# Patient Record
Sex: Male | Born: 1940
Health system: Southern US, Community
[De-identification: ages and names within clinical notes are randomized; demographics above are authoritative.]

## PROBLEM LIST (undated history)

## (undated) DIAGNOSIS — R569 Unspecified convulsions: Secondary | ICD-10-CM

## (undated) DIAGNOSIS — Z8719 Personal history of other diseases of the digestive system: Secondary | ICD-10-CM

## (undated) DIAGNOSIS — M545 Low back pain, unspecified: Secondary | ICD-10-CM

## (undated) DIAGNOSIS — K449 Diaphragmatic hernia without obstruction or gangrene: Secondary | ICD-10-CM

## (undated) DIAGNOSIS — I10 Essential (primary) hypertension: Secondary | ICD-10-CM

## (undated) DIAGNOSIS — F0150 Vascular dementia without behavioral disturbance: Secondary | ICD-10-CM

## (undated) DIAGNOSIS — G4733 Obstructive sleep apnea (adult) (pediatric): Secondary | ICD-10-CM

## (undated) DIAGNOSIS — E663 Overweight: Secondary | ICD-10-CM

## (undated) DIAGNOSIS — I872 Venous insufficiency (chronic) (peripheral): Secondary | ICD-10-CM

## (undated) DIAGNOSIS — D649 Anemia, unspecified: Secondary | ICD-10-CM

## (undated) DIAGNOSIS — E7800 Pure hypercholesterolemia, unspecified: Secondary | ICD-10-CM

## (undated) DIAGNOSIS — M109 Gout, unspecified: Secondary | ICD-10-CM

## (undated) DIAGNOSIS — K219 Gastro-esophageal reflux disease without esophagitis: Secondary | ICD-10-CM

## (undated) DIAGNOSIS — K5730 Diverticulosis of large intestine without perforation or abscess without bleeding: Secondary | ICD-10-CM

## (undated) DIAGNOSIS — E119 Type 2 diabetes mellitus without complications: Secondary | ICD-10-CM

## (undated) DIAGNOSIS — N200 Calculus of kidney: Secondary | ICD-10-CM

## (undated) DIAGNOSIS — M1990 Unspecified osteoarthritis, unspecified site: Secondary | ICD-10-CM

## (undated) DIAGNOSIS — K56609 Unspecified intestinal obstruction, unspecified as to partial versus complete obstruction: Secondary | ICD-10-CM

## (undated) DIAGNOSIS — K589 Irritable bowel syndrome without diarrhea: Secondary | ICD-10-CM

## (undated) DIAGNOSIS — I639 Cerebral infarction, unspecified: Secondary | ICD-10-CM

## (undated) DIAGNOSIS — Q638 Other specified congenital malformations of kidney: Secondary | ICD-10-CM

## (undated) DIAGNOSIS — F411 Generalized anxiety disorder: Secondary | ICD-10-CM

## (undated) DIAGNOSIS — C649 Malignant neoplasm of unspecified kidney, except renal pelvis: Secondary | ICD-10-CM

## (undated) DIAGNOSIS — Z87898 Personal history of other specified conditions: Secondary | ICD-10-CM

## (undated) DIAGNOSIS — I679 Cerebrovascular disease, unspecified: Secondary | ICD-10-CM

## (undated) DIAGNOSIS — F418 Other specified anxiety disorders: Secondary | ICD-10-CM

## (undated) DIAGNOSIS — Z5189 Encounter for other specified aftercare: Secondary | ICD-10-CM

## (undated) HISTORY — DX: Gastro-esophageal reflux disease without esophagitis: K21.9

## (undated) HISTORY — DX: Malignant neoplasm of unspecified kidney, except renal pelvis: C64.9

## (undated) HISTORY — PX: OTHER SURGICAL HISTORY: SHX169

## (undated) HISTORY — DX: Gout, unspecified: M10.9

## (undated) HISTORY — DX: Obstructive sleep apnea (adult) (pediatric): G47.33

## (undated) HISTORY — DX: Unspecified convulsions: R56.9

## (undated) HISTORY — DX: Low back pain: M54.5

## (undated) HISTORY — PX: CHOLECYSTECTOMY: SHX55

## (undated) HISTORY — DX: Diverticulosis of large intestine without perforation or abscess without bleeding: K57.30

## (undated) HISTORY — DX: Irritable bowel syndrome, unspecified: K58.9

## (undated) HISTORY — DX: Personal history of other diseases of the digestive system: Z87.19

## (undated) HISTORY — DX: Low back pain, unspecified: M54.50

## (undated) HISTORY — DX: Diaphragmatic hernia without obstruction or gangrene: K44.9

## (undated) HISTORY — DX: Personal history of other specified conditions: Z87.898

## (undated) HISTORY — DX: Other specified congenital malformations of kidney: Q63.8

## (undated) HISTORY — DX: Pure hypercholesterolemia, unspecified: E78.00

## (undated) HISTORY — DX: Encounter for other specified aftercare: Z51.89

## (undated) HISTORY — DX: Unspecified osteoarthritis, unspecified site: M19.90

## (undated) HISTORY — PX: SMALL INTESTINE SURGERY: SHX150

## (undated) HISTORY — DX: Cerebrovascular disease, unspecified: I67.9

## (undated) HISTORY — DX: Generalized anxiety disorder: F41.1

## (undated) HISTORY — DX: Unspecified intestinal obstruction, unspecified as to partial versus complete obstruction: K56.609

## (undated) HISTORY — PX: CAROTID ENDARTERECTOMY: SUR193

## (undated) HISTORY — DX: Type 2 diabetes mellitus without complications: E11.9

## (undated) HISTORY — DX: Essential (primary) hypertension: I10

## (undated) HISTORY — DX: Calculus of kidney: N20.0

## (undated) HISTORY — PX: NEPHRECTOMY: SHX65

## (undated) HISTORY — DX: Overweight: E66.3

## (undated) HISTORY — DX: Venous insufficiency (chronic) (peripheral): I87.2

---
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
## 1992-04-13 HISTORY — PX: NEPHRECTOMY: SHX65

## 1994-04-13 HISTORY — PX: INCISIONAL HERNIA REPAIR: SHX193

## 1995-03-14 HISTORY — PX: NASAL SINUS SURGERY: SHX719

## 1998-09-19 ENCOUNTER — Encounter: Payer: Self-pay | Admitting: Vascular Surgery

## 1998-09-20 ENCOUNTER — Inpatient Hospital Stay: Admission: RE | Admit: 1998-09-20 | Discharge: 1998-09-21 | Payer: Self-pay | Admitting: Vascular Surgery

## 1999-05-07 ENCOUNTER — Ambulatory Visit (HOSPITAL_COMMUNITY): Admission: RE | Admit: 1999-05-07 | Discharge: 1999-05-07 | Payer: Self-pay | Admitting: Neurosurgery

## 1999-05-07 ENCOUNTER — Encounter: Payer: Self-pay | Admitting: Neurosurgery

## 1999-05-30 ENCOUNTER — Ambulatory Visit (HOSPITAL_COMMUNITY): Admission: RE | Admit: 1999-05-30 | Discharge: 1999-05-31 | Payer: Self-pay | Admitting: Neurosurgery

## 1999-06-13 ENCOUNTER — Encounter: Payer: Self-pay | Admitting: Neurosurgery

## 1999-06-13 ENCOUNTER — Ambulatory Visit (HOSPITAL_COMMUNITY): Admission: RE | Admit: 1999-06-13 | Discharge: 1999-06-13 | Payer: Self-pay | Admitting: Neurosurgery

## 1999-07-09 ENCOUNTER — Inpatient Hospital Stay (HOSPITAL_COMMUNITY): Admission: EM | Admit: 1999-07-09 | Discharge: 1999-07-16 | Payer: Self-pay | Admitting: Emergency Medicine

## 1999-07-09 ENCOUNTER — Encounter: Payer: Self-pay | Admitting: Neurology

## 1999-07-09 ENCOUNTER — Ambulatory Visit (HOSPITAL_COMMUNITY): Admission: RE | Admit: 1999-07-09 | Discharge: 1999-07-09 | Payer: Self-pay | Admitting: Neurosurgery

## 1999-07-09 ENCOUNTER — Encounter: Payer: Self-pay | Admitting: Emergency Medicine

## 1999-07-09 ENCOUNTER — Encounter: Payer: Self-pay | Admitting: Neurosurgery

## 1999-07-10 ENCOUNTER — Encounter: Payer: Self-pay | Admitting: Neurology

## 1999-07-11 ENCOUNTER — Encounter: Payer: Self-pay | Admitting: Neurology

## 1999-10-30 ENCOUNTER — Encounter: Payer: Self-pay | Admitting: Neurology

## 1999-10-30 ENCOUNTER — Ambulatory Visit (HOSPITAL_COMMUNITY): Admission: RE | Admit: 1999-10-30 | Discharge: 1999-10-30 | Payer: Self-pay | Admitting: Neurology

## 1999-11-06 ENCOUNTER — Inpatient Hospital Stay (HOSPITAL_COMMUNITY): Admission: RE | Admit: 1999-11-06 | Discharge: 1999-11-07 | Payer: Self-pay | Admitting: Interventional Radiology

## 2000-02-17 ENCOUNTER — Ambulatory Visit (HOSPITAL_COMMUNITY): Admission: RE | Admit: 2000-02-17 | Discharge: 2000-02-17 | Payer: Self-pay | Admitting: *Deleted

## 2000-03-14 ENCOUNTER — Emergency Department (HOSPITAL_COMMUNITY): Admission: EM | Admit: 2000-03-14 | Discharge: 2000-03-14 | Payer: Self-pay | Admitting: Emergency Medicine

## 2000-06-21 ENCOUNTER — Ambulatory Visit (HOSPITAL_COMMUNITY): Admission: RE | Admit: 2000-06-21 | Discharge: 2000-06-21 | Payer: Self-pay | Admitting: Neurology

## 2000-06-21 ENCOUNTER — Encounter: Payer: Self-pay | Admitting: Neurology

## 2000-07-27 ENCOUNTER — Ambulatory Visit (HOSPITAL_COMMUNITY): Admission: RE | Admit: 2000-07-27 | Discharge: 2000-07-27 | Payer: Self-pay | Admitting: Interventional Radiology

## 2000-09-11 HISTORY — PX: CAROTID ENDARTERECTOMY: SUR193

## 2000-09-30 ENCOUNTER — Inpatient Hospital Stay (HOSPITAL_COMMUNITY): Admission: EM | Admit: 2000-09-30 | Discharge: 2000-10-10 | Payer: Self-pay | Admitting: Emergency Medicine

## 2000-09-30 ENCOUNTER — Encounter: Payer: Self-pay | Admitting: Emergency Medicine

## 2000-09-30 ENCOUNTER — Encounter: Payer: Self-pay | Admitting: Neurology

## 2000-10-01 ENCOUNTER — Encounter: Payer: Self-pay | Admitting: Neurology

## 2000-10-04 ENCOUNTER — Encounter: Payer: Self-pay | Admitting: Neurology

## 2001-10-25 ENCOUNTER — Ambulatory Visit (HOSPITAL_COMMUNITY): Admission: RE | Admit: 2001-10-25 | Discharge: 2001-10-25 | Payer: Self-pay | Admitting: Interventional Radiology

## 2002-01-05 ENCOUNTER — Other Ambulatory Visit: Admission: RE | Admit: 2002-01-05 | Discharge: 2002-01-05 | Payer: Self-pay | Admitting: Gastroenterology

## 2002-05-24 ENCOUNTER — Ambulatory Visit (HOSPITAL_COMMUNITY): Admission: RE | Admit: 2002-05-24 | Discharge: 2002-05-24 | Payer: Self-pay | Admitting: Neurology

## 2002-05-24 ENCOUNTER — Encounter: Payer: Self-pay | Admitting: Neurology

## 2002-11-06 ENCOUNTER — Ambulatory Visit (HOSPITAL_COMMUNITY): Admission: RE | Admit: 2002-11-06 | Discharge: 2002-11-06 | Payer: Self-pay | Admitting: Interventional Radiology

## 2002-11-13 ENCOUNTER — Ambulatory Visit (HOSPITAL_COMMUNITY): Admission: RE | Admit: 2002-11-13 | Discharge: 2002-11-13 | Payer: Self-pay | Admitting: Pulmonary Disease

## 2002-11-13 ENCOUNTER — Encounter: Payer: Self-pay | Admitting: Pulmonary Disease

## 2002-12-13 HISTORY — PX: CHOLECYSTECTOMY: SHX55

## 2002-12-22 ENCOUNTER — Encounter: Payer: Self-pay | Admitting: General Surgery

## 2002-12-26 ENCOUNTER — Inpatient Hospital Stay (HOSPITAL_COMMUNITY): Admission: RE | Admit: 2002-12-26 | Discharge: 2002-12-29 | Payer: Self-pay | Admitting: General Surgery

## 2002-12-26 ENCOUNTER — Encounter (INDEPENDENT_AMBULATORY_CARE_PROVIDER_SITE_OTHER): Payer: Self-pay | Admitting: *Deleted

## 2003-11-12 ENCOUNTER — Ambulatory Visit (HOSPITAL_COMMUNITY): Admission: RE | Admit: 2003-11-12 | Discharge: 2003-11-12 | Payer: Self-pay | Admitting: Interventional Radiology

## 2004-04-18 ENCOUNTER — Ambulatory Visit: Payer: Self-pay | Admitting: Gastroenterology

## 2004-10-28 ENCOUNTER — Ambulatory Visit: Payer: Self-pay | Admitting: Pulmonary Disease

## 2005-01-26 ENCOUNTER — Ambulatory Visit (HOSPITAL_COMMUNITY): Admission: RE | Admit: 2005-01-26 | Discharge: 2005-01-26 | Payer: Self-pay | Admitting: Neurosurgery

## 2005-02-06 ENCOUNTER — Encounter: Admission: RE | Admit: 2005-02-06 | Discharge: 2005-02-06 | Payer: Self-pay | Admitting: Neurosurgery

## 2005-03-23 ENCOUNTER — Ambulatory Visit (HOSPITAL_COMMUNITY): Admission: RE | Admit: 2005-03-23 | Discharge: 2005-03-23 | Payer: Self-pay | Admitting: Interventional Radiology

## 2005-04-30 ENCOUNTER — Ambulatory Visit: Payer: Self-pay | Admitting: Pulmonary Disease

## 2005-05-11 ENCOUNTER — Ambulatory Visit: Payer: Self-pay | Admitting: Pulmonary Disease

## 2005-05-15 ENCOUNTER — Ambulatory Visit (HOSPITAL_COMMUNITY): Admission: RE | Admit: 2005-05-15 | Discharge: 2005-05-15 | Payer: Self-pay | Admitting: Neurosurgery

## 2005-05-28 ENCOUNTER — Ambulatory Visit: Payer: Self-pay

## 2006-05-02 ENCOUNTER — Ambulatory Visit (HOSPITAL_COMMUNITY): Admission: RE | Admit: 2006-05-02 | Discharge: 2006-05-02 | Payer: Self-pay | Admitting: Rheumatology

## 2006-09-21 ENCOUNTER — Ambulatory Visit: Payer: Self-pay | Admitting: Pulmonary Disease

## 2006-09-22 ENCOUNTER — Ambulatory Visit: Payer: Self-pay | Admitting: Pulmonary Disease

## 2006-09-22 LAB — CONVERTED CEMR LAB
ALT: 20 units/L (ref 0–40)
AST: 19 units/L (ref 0–37)
Albumin: 3.2 g/dL — ABNORMAL LOW (ref 3.5–5.2)
Alkaline Phosphatase: 74 units/L (ref 39–117)
BUN: 23 mg/dL (ref 6–23)
Basophils Absolute: 0.1 10*3/uL (ref 0.0–0.1)
Basophils Relative: 0.8 % (ref 0.0–1.0)
Bilirubin, Direct: 0.1 mg/dL (ref 0.0–0.3)
CO2: 27 meq/L (ref 19–32)
Calcium: 9.4 mg/dL (ref 8.4–10.5)
Chloride: 106 meq/L (ref 96–112)
Cholesterol: 153 mg/dL (ref 0–200)
Creatinine, Ser: 1.2 mg/dL (ref 0.4–1.5)
Eosinophils Absolute: 0.7 10*3/uL — ABNORMAL HIGH (ref 0.0–0.6)
Eosinophils Relative: 11.2 % — ABNORMAL HIGH (ref 0.0–5.0)
GFR calc Af Amer: 78 mL/min
GFR calc non Af Amer: 64 mL/min
Glucose, Bld: 155 mg/dL — ABNORMAL HIGH (ref 70–99)
HCT: 40 % (ref 39.0–52.0)
HDL: 35.9 mg/dL — ABNORMAL LOW (ref >39.0)
Hemoglobin: 14.1 g/dL (ref 13.0–17.0)
Hgb A1c MFr Bld: 8.1 % — ABNORMAL HIGH (ref 4.6–6.0)
LDL Cholesterol: 93 mg/dL (ref 0–99)
Lymphocytes Relative: 21.1 % (ref 12.0–46.0)
MCHC: 35.3 g/dL (ref 30.0–36.0)
MCV: 92.9 fL (ref 78.0–100.0)
Monocytes Absolute: 0.5 10*3/uL (ref 0.2–0.7)
Monocytes Relative: 7.8 % (ref 3.0–11.0)
Neutro Abs: 3.7 10*3/uL (ref 1.4–7.7)
Neutrophils Relative %: 59.1 % (ref 43.0–77.0)
PSA: 0.92 ng/mL (ref 0.10–4.00)
Platelets: 196 10*3/uL (ref 150–400)
Potassium: 4.8 meq/L (ref 3.5–5.1)
RBC: 4.31 M/uL (ref 4.22–5.81)
RDW: 12.3 % (ref 11.5–14.6)
Sodium: 142 meq/L (ref 135–145)
TSH: 3.86 microintl units/mL (ref 0.35–5.50)
Total Bilirubin: 0.5 mg/dL (ref 0.3–1.2)
Total CHOL/HDL Ratio: 4.3
Total Protein: 6 g/dL (ref 6.0–8.3)
Triglycerides: 120 mg/dL (ref 0–149)
VLDL: 24 mg/dL (ref 0–40)
WBC: 6.4 10*3/uL (ref 4.5–10.5)

## 2007-02-01 ENCOUNTER — Ambulatory Visit: Payer: Self-pay | Admitting: Pulmonary Disease

## 2007-02-22 ENCOUNTER — Ambulatory Visit: Payer: Self-pay | Admitting: Pulmonary Disease

## 2007-02-22 LAB — CONVERTED CEMR LAB
BUN: 19 mg/dL (ref 6–23)
CO2: 29 meq/L (ref 19–32)
Calcium: 9.2 mg/dL (ref 8.4–10.5)
Chloride: 102 meq/L (ref 96–112)
Creatinine, Ser: 1.3 mg/dL (ref 0.4–1.5)
GFR calc Af Amer: 71 mL/min
GFR calc non Af Amer: 59 mL/min
Glucose, Bld: 352 mg/dL — ABNORMAL HIGH (ref 70–99)
Hgb A1c MFr Bld: 8.1 % — ABNORMAL HIGH (ref 4.6–6.0)
Potassium: 4.7 meq/L (ref 3.5–5.1)
Sodium: 138 meq/L (ref 135–145)

## 2007-03-01 ENCOUNTER — Ambulatory Visit (HOSPITAL_BASED_OUTPATIENT_CLINIC_OR_DEPARTMENT_OTHER): Admission: RE | Admit: 2007-03-01 | Discharge: 2007-03-01 | Payer: Self-pay | Admitting: Pulmonary Disease

## 2007-03-18 ENCOUNTER — Ambulatory Visit: Payer: Self-pay | Admitting: Pulmonary Disease

## 2007-03-22 DIAGNOSIS — M109 Gout, unspecified: Secondary | ICD-10-CM | POA: Insufficient documentation

## 2007-03-22 DIAGNOSIS — K449 Diaphragmatic hernia without obstruction or gangrene: Secondary | ICD-10-CM | POA: Insufficient documentation

## 2007-03-22 DIAGNOSIS — M545 Low back pain, unspecified: Secondary | ICD-10-CM | POA: Insufficient documentation

## 2007-03-22 DIAGNOSIS — F411 Generalized anxiety disorder: Secondary | ICD-10-CM

## 2007-03-22 DIAGNOSIS — I10 Essential (primary) hypertension: Secondary | ICD-10-CM | POA: Insufficient documentation

## 2007-03-22 DIAGNOSIS — K219 Gastro-esophageal reflux disease without esophagitis: Secondary | ICD-10-CM

## 2007-03-22 DIAGNOSIS — E7800 Pure hypercholesterolemia, unspecified: Secondary | ICD-10-CM

## 2007-03-22 DIAGNOSIS — M1990 Unspecified osteoarthritis, unspecified site: Secondary | ICD-10-CM | POA: Insufficient documentation

## 2007-03-25 ENCOUNTER — Encounter: Payer: Self-pay | Admitting: Pulmonary Disease

## 2007-04-29 ENCOUNTER — Encounter: Payer: Self-pay | Admitting: Pulmonary Disease

## 2007-10-03 ENCOUNTER — Encounter: Payer: Self-pay | Admitting: Pulmonary Disease

## 2007-10-17 ENCOUNTER — Telehealth (INDEPENDENT_AMBULATORY_CARE_PROVIDER_SITE_OTHER): Payer: Self-pay | Admitting: *Deleted

## 2007-11-02 ENCOUNTER — Telehealth: Payer: Self-pay | Admitting: Pulmonary Disease

## 2007-11-21 ENCOUNTER — Ambulatory Visit: Payer: Self-pay | Admitting: Pulmonary Disease

## 2007-11-23 ENCOUNTER — Ambulatory Visit: Payer: Self-pay | Admitting: Pulmonary Disease

## 2007-11-28 DIAGNOSIS — G4733 Obstructive sleep apnea (adult) (pediatric): Secondary | ICD-10-CM | POA: Insufficient documentation

## 2007-11-28 DIAGNOSIS — K5730 Diverticulosis of large intestine without perforation or abscess without bleeding: Secondary | ICD-10-CM | POA: Insufficient documentation

## 2007-11-28 DIAGNOSIS — I872 Venous insufficiency (chronic) (peripheral): Secondary | ICD-10-CM | POA: Insufficient documentation

## 2007-11-28 DIAGNOSIS — N200 Calculus of kidney: Secondary | ICD-10-CM

## 2007-11-28 DIAGNOSIS — Q631 Lobulated, fused and horseshoe kidney: Secondary | ICD-10-CM

## 2007-11-28 LAB — CONVERTED CEMR LAB
ALT: 19 units/L (ref 0–53)
AST: 18 units/L (ref 0–37)
Albumin: 3.3 g/dL — ABNORMAL LOW (ref 3.5–5.2)
Alkaline Phosphatase: 64 units/L (ref 39–117)
BUN: 28 mg/dL — ABNORMAL HIGH (ref 6–23)
Basophils Absolute: 0 10*3/uL (ref 0.0–0.1)
Basophils Relative: 0.7 % (ref 0.0–3.0)
Bilirubin, Direct: 0.1 mg/dL (ref 0.0–0.3)
CO2: 31 meq/L (ref 19–32)
Calcium: 9.1 mg/dL (ref 8.4–10.5)
Chloride: 108 meq/L (ref 96–112)
Cholesterol: 131 mg/dL (ref 0–200)
Creatinine, Ser: 1.2 mg/dL (ref 0.4–1.5)
Eosinophils Absolute: 0.4 10*3/uL (ref 0.0–0.7)
Eosinophils Relative: 8.2 % — ABNORMAL HIGH (ref 0.0–5.0)
GFR calc Af Amer: 77 mL/min
GFR calc non Af Amer: 64 mL/min
Glucose, Bld: 128 mg/dL — ABNORMAL HIGH (ref 70–99)
HCT: 39.4 % (ref 39.0–52.0)
HDL: 38.2 mg/dL — ABNORMAL LOW (ref >39.0)
Hemoglobin: 13.8 g/dL (ref 13.0–17.0)
Hgb A1c MFr Bld: 7.1 % — ABNORMAL HIGH (ref 4.6–6.0)
LDL Cholesterol: 76 mg/dL (ref 0–99)
Lymphocytes Relative: 18.8 % (ref 12.0–46.0)
MCHC: 35.1 g/dL (ref 30.0–36.0)
MCV: 96.2 fL (ref 78.0–100.0)
Monocytes Absolute: 0.4 10*3/uL (ref 0.1–1.0)
Monocytes Relative: 8.2 % (ref 3.0–12.0)
Neutro Abs: 3.6 10*3/uL (ref 1.4–7.7)
Neutrophils Relative %: 64.1 % (ref 43.0–77.0)
PSA: 0.76 ng/mL (ref 0.10–4.00)
Platelets: 170 10*3/uL (ref 150–400)
Potassium: 4.8 meq/L (ref 3.5–5.1)
RBC: 4.09 M/uL — ABNORMAL LOW (ref 4.22–5.81)
RDW: 11.9 % (ref 11.5–14.6)
Sodium: 143 meq/L (ref 135–145)
TSH: 3.26 microintl units/mL (ref 0.35–5.50)
Total Bilirubin: 0.6 mg/dL (ref 0.3–1.2)
Total CHOL/HDL Ratio: 3.4
Total Protein: 6.1 g/dL (ref 6.0–8.3)
Triglycerides: 84 mg/dL (ref 0–149)
Uric Acid, Serum: 3.8 mg/dL — ABNORMAL LOW (ref 4.0–7.8)
VLDL: 17 mg/dL (ref 0–40)
WBC: 5.4 10*3/uL (ref 4.5–10.5)

## 2008-03-14 DIAGNOSIS — K5900 Constipation, unspecified: Secondary | ICD-10-CM | POA: Insufficient documentation

## 2008-03-14 DIAGNOSIS — K222 Esophageal obstruction: Secondary | ICD-10-CM

## 2008-03-15 ENCOUNTER — Ambulatory Visit: Payer: Self-pay | Admitting: Gastroenterology

## 2008-03-15 DIAGNOSIS — R195 Other fecal abnormalities: Secondary | ICD-10-CM | POA: Insufficient documentation

## 2008-03-15 LAB — CONVERTED CEMR LAB
ALT: 31 units/L (ref 0–53)
AST: 28 units/L (ref 0–37)
Albumin: 3.4 g/dL — ABNORMAL LOW (ref 3.5–5.2)
Alkaline Phosphatase: 75 units/L (ref 39–117)
BUN: 29 mg/dL — ABNORMAL HIGH (ref 6–23)
Basophils Absolute: 0.2 10*3/uL — ABNORMAL HIGH (ref 0.0–0.1)
Basophils Relative: 3.5 % — ABNORMAL HIGH (ref 0.0–3.0)
Bilirubin, Direct: 0.1 mg/dL (ref 0.0–0.3)
CO2: 30 meq/L (ref 19–32)
Calcium: 9 mg/dL (ref 8.4–10.5)
Chloride: 108 meq/L (ref 96–112)
Creatinine, Ser: 1.5 mg/dL (ref 0.4–1.5)
Eosinophils Absolute: 0.5 10*3/uL (ref 0.0–0.7)
Eosinophils Relative: 7.9 % — ABNORMAL HIGH (ref 0.0–5.0)
Ferritin: 30.6 ng/mL (ref 22.0–322.0)
Folate: 10.1 ng/mL
GFR calc Af Amer: 60 mL/min
GFR calc non Af Amer: 49 mL/min
Glucose, Bld: 138 mg/dL — ABNORMAL HIGH (ref 70–99)
HCT: 39 % (ref 39.0–52.0)
Hemoglobin: 13.4 g/dL (ref 13.0–17.0)
Iron: 62 ug/dL (ref 42–165)
Lymphocytes Relative: 17.3 % (ref 12.0–46.0)
MCHC: 34.3 g/dL (ref 30.0–36.0)
MCV: 97.2 fL (ref 78.0–100.0)
Monocytes Absolute: 0.6 10*3/uL (ref 0.1–1.0)
Monocytes Relative: 9.8 % (ref 3.0–12.0)
Neutro Abs: 4.1 10*3/uL (ref 1.4–7.7)
Neutrophils Relative %: 61.5 % (ref 43.0–77.0)
Platelets: 179 10*3/uL (ref 150–400)
Potassium: 4.3 meq/L (ref 3.5–5.1)
RBC: 4.01 M/uL — ABNORMAL LOW (ref 4.22–5.81)
RDW: 12.2 % (ref 11.5–14.6)
Saturation Ratios: 15.7 % — ABNORMAL LOW (ref 20.0–50.0)
Sed Rate: 25 mm/hr — ABNORMAL HIGH (ref 0–16)
Sodium: 142 meq/L (ref 135–145)
TSH: 2.52 microintl units/mL (ref 0.35–5.50)
Total Bilirubin: 0.4 mg/dL (ref 0.3–1.2)
Total Protein: 6.6 g/dL (ref 6.0–8.3)
Transferrin: 281.8 mg/dL (ref >=212.0)
Vitamin B-12: 270 pg/mL (ref 211–911)
WBC: 6.5 10*3/uL (ref 4.5–10.5)

## 2008-03-26 ENCOUNTER — Ambulatory Visit: Payer: Self-pay | Admitting: Gastroenterology

## 2008-04-11 ENCOUNTER — Telehealth: Payer: Self-pay | Admitting: Pulmonary Disease

## 2008-04-11 ENCOUNTER — Encounter: Payer: Self-pay | Admitting: Pulmonary Disease

## 2008-04-19 ENCOUNTER — Encounter: Payer: Self-pay | Admitting: Pulmonary Disease

## 2008-04-24 ENCOUNTER — Encounter: Payer: Self-pay | Admitting: Pulmonary Disease

## 2008-05-03 ENCOUNTER — Ambulatory Visit: Payer: Self-pay | Admitting: Gastroenterology

## 2008-05-10 ENCOUNTER — Encounter: Payer: Self-pay | Admitting: Pulmonary Disease

## 2008-05-21 ENCOUNTER — Ambulatory Visit: Payer: Self-pay | Admitting: Pulmonary Disease

## 2008-05-27 LAB — CONVERTED CEMR LAB
BUN: 17 mg/dL (ref 6–23)
CO2: 28 meq/L (ref 19–32)
Calcium: 9.2 mg/dL (ref 8.4–10.5)
Chloride: 104 meq/L (ref 96–112)
Creatinine, Ser: 1.1 mg/dL (ref 0.4–1.5)
GFR calc Af Amer: 86 mL/min
GFR calc non Af Amer: 71 mL/min
Glucose, Bld: 249 mg/dL — ABNORMAL HIGH (ref 70–99)
Hgb A1c MFr Bld: 7.3 % — ABNORMAL HIGH (ref 4.6–6.0)
Potassium: 4.8 meq/L (ref 3.5–5.1)
Sodium: 139 meq/L (ref 135–145)

## 2008-06-05 ENCOUNTER — Telehealth (INDEPENDENT_AMBULATORY_CARE_PROVIDER_SITE_OTHER): Payer: Self-pay | Admitting: *Deleted

## 2008-06-06 DIAGNOSIS — E663 Overweight: Secondary | ICD-10-CM | POA: Insufficient documentation

## 2008-06-12 ENCOUNTER — Telehealth (INDEPENDENT_AMBULATORY_CARE_PROVIDER_SITE_OTHER): Payer: Self-pay | Admitting: *Deleted

## 2008-06-12 ENCOUNTER — Ambulatory Visit: Payer: Self-pay | Admitting: Vascular Surgery

## 2008-07-02 ENCOUNTER — Encounter: Payer: Self-pay | Admitting: Interventional Radiology

## 2008-07-17 ENCOUNTER — Ambulatory Visit: Payer: Self-pay | Admitting: Gastroenterology

## 2008-07-18 LAB — CONVERTED CEMR LAB
ALT: 24 units/L (ref 0–53)
AST: 27 units/L (ref 0–37)
Albumin: 3.5 g/dL (ref 3.5–5.2)
Alkaline Phosphatase: 79 units/L (ref 39–117)
Basophils Absolute: 0 10*3/uL (ref 0.0–0.1)
Basophils Relative: 0 % (ref 0.0–3.0)
Bilirubin, Direct: 0 mg/dL (ref 0.0–0.3)
Eosinophils Absolute: 0.4 10*3/uL (ref 0.0–0.7)
Eosinophils Relative: 7 % — ABNORMAL HIGH (ref 0.0–5.0)
Ferritin: 29.7 ng/mL (ref 22.0–322.0)
Folate: 12.5 ng/mL
HCT: 40.7 % (ref 39.0–52.0)
Hemoglobin: 14 g/dL (ref 13.0–17.0)
Iron: 43 ug/dL (ref 42–165)
Lymphocytes Relative: 16.9 % (ref 12.0–46.0)
Lymphs Abs: 1 10*3/uL (ref 0.7–4.0)
MCHC: 34.3 g/dL (ref 30.0–36.0)
MCV: 95.6 fL (ref 78.0–100.0)
Monocytes Absolute: 0.7 10*3/uL (ref 0.1–1.0)
Monocytes Relative: 10.6 % (ref 3.0–12.0)
Neutro Abs: 4.1 10*3/uL (ref 1.4–7.7)
Neutrophils Relative %: 65.5 % (ref 43.0–77.0)
Platelets: 180 10*3/uL (ref 150.0–400.0)
RBC: 4.26 M/uL (ref 4.22–5.81)
RDW: 12.2 % (ref 11.5–14.6)
Saturation Ratios: 11.6 % — ABNORMAL LOW (ref 20.0–50.0)
Total Bilirubin: 0.5 mg/dL (ref 0.3–1.2)
Total Protein: 6.6 g/dL (ref 6.0–8.3)
Transferrin: 265.3 mg/dL (ref 212.0–360.0)
Vitamin B-12: 222 pg/mL (ref 211–911)
WBC: 6.2 10*3/uL (ref 4.5–10.5)

## 2008-07-25 ENCOUNTER — Ambulatory Visit: Payer: Self-pay | Admitting: Gastroenterology

## 2008-07-25 DIAGNOSIS — E538 Deficiency of other specified B group vitamins: Secondary | ICD-10-CM | POA: Insufficient documentation

## 2008-07-25 LAB — CONVERTED CEMR LAB: Fecal Occult Bld: NEGATIVE

## 2008-07-30 ENCOUNTER — Telehealth (INDEPENDENT_AMBULATORY_CARE_PROVIDER_SITE_OTHER): Payer: Self-pay | Admitting: *Deleted

## 2008-08-01 ENCOUNTER — Ambulatory Visit: Payer: Self-pay | Admitting: Gastroenterology

## 2008-08-08 ENCOUNTER — Ambulatory Visit (HOSPITAL_COMMUNITY): Admission: RE | Admit: 2008-08-08 | Discharge: 2008-08-08 | Payer: Self-pay | Admitting: Interventional Radiology

## 2008-08-17 ENCOUNTER — Telehealth (INDEPENDENT_AMBULATORY_CARE_PROVIDER_SITE_OTHER): Payer: Self-pay | Admitting: *Deleted

## 2008-09-19 ENCOUNTER — Encounter: Payer: Self-pay | Admitting: Pulmonary Disease

## 2008-10-08 ENCOUNTER — Telehealth: Payer: Self-pay | Admitting: Pulmonary Disease

## 2008-12-24 ENCOUNTER — Telehealth (INDEPENDENT_AMBULATORY_CARE_PROVIDER_SITE_OTHER): Payer: Self-pay | Admitting: *Deleted

## 2009-03-14 ENCOUNTER — Ambulatory Visit (HOSPITAL_COMMUNITY): Admission: RE | Admit: 2009-03-14 | Discharge: 2009-03-14 | Payer: Self-pay | Admitting: Interventional Radiology

## 2009-04-02 ENCOUNTER — Ambulatory Visit: Payer: Self-pay | Admitting: Pulmonary Disease

## 2009-04-03 LAB — CONVERTED CEMR LAB
BUN: 19 mg/dL (ref 6–23)
Basophils Absolute: 0.1 10*3/uL (ref 0.0–0.1)
Basophils Relative: 0.9 % (ref 0.0–3.0)
CO2: 27 meq/L (ref 19–32)
Calcium: 9.3 mg/dL (ref 8.4–10.5)
Chloride: 108 meq/L (ref 96–112)
Creatinine, Ser: 1.2 mg/dL (ref 0.4–1.5)
Eosinophils Absolute: 0.4 10*3/uL (ref 0.0–0.7)
Eosinophils Relative: 6 % — ABNORMAL HIGH (ref 0.0–5.0)
GFR calc non Af Amer: 63.73 mL/min (ref >60)
Glucose, Bld: 87 mg/dL (ref 70–99)
HCT: 39.8 % (ref 39.0–52.0)
Hemoglobin: 13.4 g/dL (ref 13.0–17.0)
Hgb A1c MFr Bld: 6.9 % — ABNORMAL HIGH (ref 4.6–6.5)
Lymphocytes Relative: 19.4 % (ref 12.0–46.0)
Lymphs Abs: 1.3 10*3/uL (ref 0.7–4.0)
MCHC: 33.6 g/dL (ref 30.0–36.0)
MCV: 100.7 fL — ABNORMAL HIGH (ref 78.0–100.0)
Monocytes Absolute: 0.6 10*3/uL (ref 0.1–1.0)
Monocytes Relative: 9.6 % (ref 3.0–12.0)
Neutro Abs: 4.2 10*3/uL (ref 1.4–7.7)
Neutrophils Relative %: 64.1 % (ref 43.0–77.0)
Platelets: 214 10*3/uL (ref 150.0–400.0)
Potassium: 4.2 meq/L (ref 3.5–5.1)
RBC: 3.96 M/uL — ABNORMAL LOW (ref 4.22–5.81)
RDW: 11.8 % (ref 11.5–14.6)
Sodium: 143 meq/L (ref 135–145)
WBC: 6.6 10*3/uL (ref 4.5–10.5)

## 2010-01-14 ENCOUNTER — Telehealth (INDEPENDENT_AMBULATORY_CARE_PROVIDER_SITE_OTHER): Payer: Self-pay | Admitting: *Deleted

## 2010-01-15 ENCOUNTER — Ambulatory Visit: Payer: Self-pay | Admitting: Pulmonary Disease

## 2010-01-19 LAB — CONVERTED CEMR LAB
ALT: 22 units/L (ref 0–53)
AST: 21 units/L (ref 0–37)
Albumin: 3.6 g/dL (ref 3.5–5.2)
Alkaline Phosphatase: 85 units/L (ref 39–117)
BUN: 21 mg/dL (ref 6–23)
Basophils Absolute: 0.1 10*3/uL (ref 0.0–0.1)
Basophils Relative: 0.9 % (ref 0.0–3.0)
Bilirubin, Direct: 0.1 mg/dL (ref 0.0–0.3)
CO2: 25 meq/L (ref 19–32)
Calcium: 9.7 mg/dL (ref 8.4–10.5)
Chloride: 114 meq/L — ABNORMAL HIGH (ref 96–112)
Cholesterol: 210 mg/dL — ABNORMAL HIGH (ref 0–200)
Creatinine, Ser: 1.3 mg/dL (ref 0.4–1.5)
Direct LDL: 149.1 mg/dL
Eosinophils Absolute: 0.6 10*3/uL (ref 0.0–0.7)
Eosinophils Relative: 9 % — ABNORMAL HIGH (ref 0.0–5.0)
GFR calc non Af Amer: 58.49 mL/min (ref >60)
Glucose, Bld: 180 mg/dL — ABNORMAL HIGH (ref 70–99)
HCT: 38.3 % — ABNORMAL LOW (ref 39.0–52.0)
HDL: 40 mg/dL (ref >39.00)
Hemoglobin: 13.5 g/dL (ref 13.0–17.0)
Hgb A1c MFr Bld: 8.4 % — ABNORMAL HIGH (ref 4.6–6.5)
Lymphocytes Relative: 21.3 % (ref 12.0–46.0)
Lymphs Abs: 1.3 10*3/uL (ref 0.7–4.0)
MCHC: 35.1 g/dL (ref 30.0–36.0)
MCV: 96.3 fL (ref 78.0–100.0)
Monocytes Absolute: 0.6 10*3/uL (ref 0.1–1.0)
Monocytes Relative: 9.7 % (ref 3.0–12.0)
Neutro Abs: 3.6 10*3/uL (ref 1.4–7.7)
Neutrophils Relative %: 59.1 % (ref 43.0–77.0)
PSA: 0.95 ng/mL (ref 0.10–4.00)
Platelets: 183 10*3/uL (ref 150.0–400.0)
Potassium: 5.7 meq/L — ABNORMAL HIGH (ref 3.5–5.1)
RBC: 3.98 M/uL — ABNORMAL LOW (ref 4.22–5.81)
RDW: 12.1 % (ref 11.5–14.6)
Sodium: 145 meq/L (ref 135–145)
TSH: 2.59 microintl units/mL (ref 0.35–5.50)
Total Bilirubin: 0.6 mg/dL (ref 0.3–1.2)
Total CHOL/HDL Ratio: 5
Total Protein: 6.4 g/dL (ref 6.0–8.3)
Triglycerides: 161 mg/dL — ABNORMAL HIGH (ref 0.0–149.0)
Uric Acid, Serum: 4.7 mg/dL (ref 4.0–7.8)
VLDL: 32.2 mg/dL (ref 0.0–40.0)
WBC: 6.1 10*3/uL (ref 4.5–10.5)

## 2010-01-21 ENCOUNTER — Telehealth (INDEPENDENT_AMBULATORY_CARE_PROVIDER_SITE_OTHER): Payer: Self-pay | Admitting: *Deleted

## 2010-05-03 ENCOUNTER — Encounter: Payer: Self-pay | Admitting: Neurosurgery

## 2010-05-13 NOTE — Assessment & Plan Note (Signed)
Summary: F-UP [facility]/BAD IMPACTION/FH    History of Present Illness Visit Type: follow up Primary GI MD: Sheryn Bison MD FACP FAGA Primary Provider: Alroy Dust, MD Chief Complaint: post hospital for fecal impaction in [place]. Pt came off Nexium and is now having GERD sx.  History of Present Illness:   Peter Nicholson is a 70 year old white male without ulcer diabetes and peripheral vascular disease with basilar artery occlusion requiring neurosurgical radiographic stent placing and 2000 with continued neurologic followup Dr. Thad Ranger. He additionally has obstructive sleep apnea, hypertensive cardiovascular disease, and hyperlipidemia. I have been seeing him for some time 4 acid reflux management daily Nexium.  Patient usually has one to 2 loose bowel movements a day without melena or hematochezia. This past Sunday night he has sudden severe constipation and had to go to [facility] emergency department where he was disimpacted with multiple enemas and manual maneuvers. KUB of the abdomen is otherwise unremarkable. Since that time he's had some hard small pellet --like stool. Denies melena hematochezia and surprisingly has not had abdominal pain or nausea and vomiting. His last colonoscopy exam was in 2003 and was negative except for some diverticulosis.  The patient denies any recent changes in his medications or to narcotic use. Is chronically on warfarin and his diabetic medications, Crestor, allopurinol, he also carries a diagnosis of obstructive sleep apnea, venous insufficiency of his lower extremities, and has had previous exploratory surgery with lysis of adhesions after removal of what sounds like a possible hypernephroma .He also has had open exploratory laparotomy cholecystectomy by Dr Maryagnes Amos several years ago next is seeing him for some bile-salt enteropathy and diarrhea in 2006.   GI Review of Systems      Denies abdominal pain, acid reflux, belching, bloating, chest pain,  dysphagia with liquids, dysphagia with solids, heartburn, loss of appetite, nausea, vomiting, vomiting blood, weight loss, and  weight gain.      Reports constipation.     Denies anal fissure, black tarry stools, change in bowel habit, diarrhea, diverticulosis, fecal incontinence, heme positive stool, hemorrhoids, irritable bowel syndrome, jaundice, light color stool, liver problems, rectal bleeding, and  rectal pain.     Prior Medications Reviewed Using: List Brought by Patient  Updated Prior Medication List: SIMVASTATIN 80 MG  TABS (SIMVASTATIN) take 1 tab by mouth at bedtime... METFORMIN HCL 500 MG TABS (METFORMIN HCL) take one tablet by mouth two times a day GLYBURIDE 5 MG  TABS (GLYBURIDE) take one tablet by mouth two times a day ACTOS 30 MG  TABS (PIOGLITAZONE HCL) take 1 by mouth once daily NEXIUM 40 MG CPDR (ESOMEPRAZOLE MAGNESIUM) Take 1 tablet by mouth once a day ALLOPURINOL 300 MG TABS (ALLOPURINOL) Take 1 tablet by mouth once a day ALPRAZOLAM 0.5 MG TABS (ALPRAZOLAM) take 1/2 to 1 tab by mouth tis as needed for nerves... ONETOUCH ULTRA TEST  STRP (GLUCOSE BLOOD) Use 1 strip twice a day CRESTOR 10 MG TABS (ROSUVASTATIN CALCIUM) one tablet by mouth once daily WARFARIN SODIUM 2.5 MG TABS (WARFARIN SODIUM) as directed WARFARIN SODIUM 5 MG TABS (WARFARIN SODIUM) as directed  Current Allergies (reviewed today): DILAUDID PHENERGAN (PROMETHAZINE HCL)  Past Medical History:    Reviewed history from 03/14/2008 and no changes required:       Current Problems:        ESOPHAGEAL STRICTURE (ICD-530.3)       CONSTIPATION (ICD-564.00)       OBSTRUCTIVE SLEEP APNEA (ICD-327.23)       HYPERTENSION (ICD-401.9)  CEREBROVASCULAR DISEASE (ICD-437.9)       VENOUS INSUFFICIENCY (ICD-459.81)       HYPERCHOLESTEROLEMIA (ICD-272.0)       DIABETES MELLITUS (ICD-250.00)       HIATAL HERNIA (ICD-553.3)       GERD (ICD-530.81)       DIVERTICULOSIS OF COLON (ICD-562.10)       Hx of  HORSESHOE KIDNEY (ICD-753.3)       Hx of RENAL CELL CANCER (ICD-189.0)       Hx of NEPHROLITHIASIS (ICD-592.0)       DEGENERATIVE JOINT DISEASE (ICD-715.90)       GOUT (ICD-274.9)       BACK PAIN, LUMBAR (ICD-724.2)       ANXIETY (ICD-300.00)         Past Surgical History:    Reviewed history from 11/21/2007 and no changes required:       S/P incisional hernia repair 1/96 by Rexene Alberts       S/P sinus surg by Ramiro Harvest 12/96       S/P cholecystectomy by Rexene Alberts 9/04       S/P part nephrectomy (right side) for renal cell ca in his horseshoe kidney in 1994       Previous cerebral vascular angioplasties and stenting and also left carotid endarterectomy in June of 2002. He did have a left hemispheric TIA and stroke and basilar artery occlusion.   Family History:    Reviewed history from 11/21/2007 and no changes required:       mother deceased age 106       father deceased age 88 from heart attack       1 sibling alive age 54  no PMH       1 sibling deceased age 30 from breast cancer  Social History:    Reviewed history from 11/21/2007 and no changes required:       never smoked       exposed to second hand smoke       does not exercise       caffeine---2 cups per day       no etoh       married       2 children    Review of Systems  The patient denies allergy/sinus, anemia, anxiety-new, arthritis/joint pain, back pain, blood in urine, breast changes/lumps, change in vision, confusion, cough, coughing up blood, depression-new, fainting, fatigue, fever, headaches-new, hearing problems, heart murmur, heart rhythm changes, itching, menstrual pain, muscle pains/cramps, night sweats, nosebleeds, pregnancy symptoms, shortness of breath, skin rash, sleeping problems, sore throat, swelling of feet/legs, swollen lymph glands, thirst - excessive , urination - excessive , urination changes/pain, urine leakage, vision changes, and voice change.     Vital Signs:  Patient Profile:   70 Years Old  Male Height:     66 inches Weight:      220.38 pounds BMI:     35.70 Pulse rate:   88 / minute Pulse rhythm:   regular BP sitting:   136 / 84  (right arm) Cuff size:   regular  Vitals Entered By: Christie Nottingham CMA (March 15, 2008 4:13 PM)                  Physical Exam  General:     Well developed, well nourished, no acute distress.healthy appearing.   Head:     Normocephalic and atraumatic. Eyes:     PERRLA, no icterus.exam deferred to patient's ophthalmologist.   Lungs:  Clear throughout to auscultation. Heart:     Regular rate and rhythm; no murmurs, rubs,  or bruits. Abdomen:     Soft, nontender and nondistended. No masses, hepatosplenomegaly or hernias noted. Normal bowel sounds.he does have an obese abdomen with multiple well-healed surgical scars and apparently has had mesh placed his abdomen because of his multiple surgeries. Bowel sounds are normal. Rectal:     Normal exam.There were no masses or tenderness noted or impaction. Soft stools present which is +1 guaiac positive. Extremities:     No clubbing, cyanosis, edema or deformities noted. Neurologic:     Alert and  oriented x4;  grossly normal neurologically. Psych:     Alert and cooperative. Normal mood and affect.    Impression & Recommendations:  Problem # 1:  CONSTIPATION (ICD-564.00) Assessment: Deteriorated the patient had sudden constipation without abdominal pain and evidence of a rather severe impaction.he has a chronic history of bile-salt enteropathy and chronically loose stools. I'm somewhat concerned about the guaiac positive nature of his stool and feel that colonoscopy is indicated along with screening laboratory parameters. I placed him on Colace 100 mg twice a day along with twice a day MiraLax 8 ounces until his colonoscopy can become bleed. I do not think we can stop his Coumadin for this procedure because was very high risk for cerebrovascular complications. TLB-CBC Platelet -  w/Differential (85025-CBCD) TLB-BMP (Basic Metabolic Panel-BMET) (80048-METABOL) TLB-Hepatic/Liver Function Pnl (80076-HEPATIC) TLB-TSH (Thyroid Stimulating Hormone) (84443-TSH) TLB-B12, Serum-Total ONLY (21308-M57) TLB-Ferritin (82728-FER) TLB-Folic Acid (Folate) (82746-FOL) TLB-IBC Pnl (Iron/FE;Transferrin) (83550-IBC) TLB-Sedimentation Rate (ESR) (85651-ESR)  Orders: Colonoscopy (Colon) TLB-CBC Platelet - w/Differential (85025-CBCD) TLB-BMP (Basic Metabolic Panel-BMET) (80048-METABOL) TLB-Hepatic/Liver Function Pnl (80076-HEPATIC) TLB-TSH (Thyroid Stimulating Hormone) (84443-TSH) TLB-B12, Serum-Total ONLY (84696-E95) TLB-Ferritin (82728-FER) TLB-Folic Acid (Folate) (82746-FOL) TLB-IBC Pnl (Iron/FE;Transferrin) (83550-IBC) TLB-Sedimentation Rate (ESR) (85651-ESR)   Problem # 2:  FECAL OCCULT BLOOD (ICD-792.1) Assessment: New  Orders: Colonoscopy (Colon) TLB-CBC Platelet - w/Differential (85025-CBCD) TLB-BMP (Basic Metabolic Panel-BMET) (80048-METABOL) TLB-Hepatic/Liver Function Pnl (80076-HEPATIC) TLB-TSH (Thyroid Stimulating Hormone) (84443-TSH) TLB-B12, Serum-Total ONLY (28413-K44) TLB-Ferritin (82728-FER) TLB-Folic Acid (Folate) (82746-FOL) TLB-IBC Pnl (Iron/FE;Transferrin) (83550-IBC) TLB-Sedimentation Rate (ESR) (85651-ESR)   Problem # 3:  OBSTRUCTIVE SLEEP APNEA (ICD-327.23) Assessment: Improved  Problem # 4:  CEREBROVASCULAR DISEASE (ICD-437.9) Assessment: Unchanged continue multiple medications per Dr Kriste Basque and Dr. Thad Ranger in neurology.  Problem # 5:  DIABETES MELLITUS (ICD-250.00) Assessment: Improved adjustments in his medications for his colonoscopy procedure have been ordered. Otherwise he seems to be under fairly good control of his diabetes per primary care management.  Problem # 6:  Hx of RENAL CELL CANCER (ICD-189.0) Assessment: Unchanged the patient has had multiple abdominal surgeries including cholecystectomy and surgery for bowel obstruction  after his nephrectomy. He apparently Has Had Mesh implanted in his abdominal wall but his symptoms are at this time do not seem consistent with larger or small bowel obstruction.   Patient Instructions: 1)  Copy Sent To:Dr. Alroy Dust and Dr. Thad Ranger in neurology 2)  Please Continue current medications. 3)  Constipation and  Hemorrhoids brochure given. 4)  Colonoscopy and Flexible Sigmoidoscopy brochure given. 5)  Conscious Sedation brochure given. 6)  [facility] Patient Information Guide given to patient.    Prescriptions: NEXIUM 40 MG CPDR (ESOMEPRAZOLE MAGNESIUM) Take 1 tablet by mouth once a day  #30 x 11   Entered by:   Harlow Mares CMA   Authorized by:   Mardella Layman MD FACG,FAGA   Signed by:   Harlow Mares CMA on 03/15/2008  Method used:   Electronically to        [facility] (retail)       [address]       [place], [place]  [address]       Ph: [phone]       Fax: [phone]   RxID:   [id number] MOVIPREP 100 GM  SOLR (PEG-KCL-NACL-NASULF-NA ASC-C) As per prep instructions.  #1 x 0   Entered by:   Harlow Mares CMA   Authorized by:   Mardella Layman MD FACG,FAGA   Signed by:   Harlow Mares CMA on 03/15/2008   Method used:   Electronically to        [facility] (retail)       [address]       [place], [place]  [address]       Ph: [phone]       Fax: [phone]   RxID:   [id number]  ]

## 2010-05-13 NOTE — Assessment & Plan Note (Signed)
Summary: 6 month f/u appt/ pt is coming fasting/mg   Primary Care Makenah Karas:  Alroy Dust, MD  CC:  10 month ROV & review of mult medical problems....  History of Present Illness: 70 y/o WM here for a follow up visit... he has multiple medical problems as noted below...    ~  Dec09:  He was hosp in [place] w/ fecal impaction... subseq GI eval DrPatterson w/ EGD showing HH, severe erosive esophagitis from GERD- Rx Nexium Bid... & Colonoscopy showing mod severe divertics...   ~  May 21, 2008:  he is a severe vasculopath w/ vertebral art angioplasty by IR x2, and known basilar art occlusion... he remains on COUMADIN w/ protimes done by DrReynolds, Neuro...  also had left CAE in 2000 by [facility]... he denies any acute problems & feels that he has been doing well... weight = 216#, no change over the last 6months... since his brief hosp in [place] for the fecal impaction- he states "I have done some research & found a lady who does enema therapy"...   ~  April 02, 2009:  since he was here last he's had f/u [facility] 3/10 w/ CDopplers that were unchanged & no further VVS follow up deemed necessary per [facility]... he's also had 2 Q83mo MRI's of Head & Neck by DrDeveshwar w/ bilat carotid siphon region atherosclerosis & narrowing (unchanged), and markedly diseased distal left vertebral & prox basilar art narrowing & irregularity (no change)... they have recommended stopping the Coumadin & he is now on ASA & Plavix.   ~  January 15, 2010:  he's had a good 10 mo he says> active in yard, heats w/ wood & does his own wood splitting... unfortunately he hasn't lost any weight, & not realy on diet... he stopped his Crestor "after talking to many people" and he stopped his Actos as well> f/u fasting labs shows LDL up to 149, and A1c up to 8.4.Marland KitchenMarland Kitchen we discussed options and he will decide what he wants to do abut these serious medical problems... due for f/u eval by DrDeveshwar/ IR to check the status of his  posterior circ problems...   Current Problem List:  OBSTRUCTIVE SLEEP APNEA (ICD-327.23) - s/p split night sleep study 11/08- showing an AHI of 19, assoc w/ an O2 sat down to 78%... during the CPAP trial he titrated up to a pressure of 11cm but never ret to REM sleep so optimal pressure is still ???...he uses [facility] for his CPAP needs...  ~  12/10: he tells me that he has stopped the CPAP, that he sleeps better without it, not snoring/ rests well, & wakes feeling refreshed... he refuses Sleep Med re-eval etc...  HYPERTENSION (ICD-401.9) - not currently on medications... BP today= 140/80 and denies HA, fatigue, visual changes, CP, palipit, dizziness, syncope, dyspnea, edema, etc... he states that his home BP checks are "OK".  CEREBROVASCULAR DISEASE (ICD-437.9) - now on ASA 325mg /d & PLAVIX 75mg /d per DrDeveshwar/ DrLawson/ DrReynolds... he has severe extracranial cerebrovasc disease- esp in the post circulation, and he is s/p vertebral art angioplasty x 2, and w/ a known basilar art occlusion- prev on Coumadin controlled by DrReynolds (switched to ASA/ Plavix in 2010)... also followed by Windell Moulding and DrDeveshwar for IR... he had a left CAE in 6/00 by [facility]...  ~  CDoppler 2/07 showed 0-39% bilat ICA stenoses, s/p left CAE w/ DPA...  ~  eval 3/10 by Windell Moulding w/ CDopplers- "he released me"...  ~  continued f/u  DrDevershwar IR w/ MRI/ MRAs- last 12/10 showing bilat carotid siphon region atherosclerosis & narrowing (unchanged), and markedly diseased distal left vertebral & prox basilar art narrowing & irregularity (no change)...   VENOUS INSUFFICIENCY (ICD-459.81) - he knows to avoid sodium, elevate legs, wear support hose...  HYPERCHOLESTEROLEMIA (ICD-272.0) - prev on CRESTOR but he stopped on his own in 2011 "after talking w/ many people"...  ~  FLP 6/08 showed TChol 153, TG 120, HDL 36, LDL 93  ~  FLP 8/09 showed TChol 131, TG 84, HDL 38, LDL 76  ~  2010:  pt was not fasting  for blood work... rec to continue Crestor10 + diet efforts.  ~  FLP 10/11 showed TChol 210, TG 161, HDL 40, LDL 149... rec to start Crestor 20mg /d, he will decide.  DIABETES MELLITUS (ICD-250.00) - currently on METFORMIN 500mg - 2Bid + GLYBURIDE 5mg Bid + he stopped Actos on his own in 2011.  ~  labs 6/08 showed BS= 155, HgA1c= 8.1.Marland Kitchen. (glyburide incr to 5Bid).  ~  labs 11/08 showed BS= 352, HgA1c= 8.1.... (Actos added).  ~  labs 8/09 showed BS= 128, HgA1c= 7.1.Marland KitchenMarland Kitchen rec- same meds, better diet, get wt down!  ~  labs 2/10 showed BS= 249, A1c= 7.3.... Metform incr to 2Bid.  ~  labs 12/10 (nonfasting- on all 3 meds) showed BS= 87, A1c= 6.9  ~  labs 10/11 (on Metform1000Bid+Gybur5Bid) showed BS= 180, A1c= 8.4.Marland KitchenMarland Kitchen rec to incr GLYBUR10Bid + diet.  OVERWEIGHT (ICD-278.02) - he states that he follows a "fat free" diet & we discussed low carb requirement for his DM & exerc program...  ~  weight 8/09 = 216#  ~  weight 2/10 = 216#  ~  weight 12/10 = 212#  ~  weight 10/11 = 210#  GERD (ICD-530.81) - prev on Nexium40, he switched to OTC PRILOSEC 20mg /d...  ~  EGD 19/03 showed 4cmHH, GERD & esoph stricture dilated...  ~  EGD 12/09 by DrPatterson showed HH & severe erosive esophagitis from GERD...  DIVERTICULOSIS OF COLON (ICD-562.10) - note: he was hosp in [place] briefly 12/09 for fecal impaction...  ~  colonoscopy 10/03 by DrPatterson showed divertics only...  ~  colonoscopy 12/09 by DrPatterson was also normal x for divertics...  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 Hx of RENAL CELL CANCER (ICD-189.0) - he had a partial nephrectomy of the right side of his horseshoe kidney in 1994 due to renal cell ca... still followed by DrPeterson yearly by hs hx but we don't have any recent notes...  ~  labs 10/11 showed PSA= 0.95  Hx of NEPHROLITHIASIS (ICD-592.0) - remote hx of kidney stones followed by DrPeterson...  DEGENERATIVE JOINT DISEASE (ICD-715.90) - and  elbow tendonitis Rx'd OTC Prn...  GOUT (ICD-274.9) - on ALLOPURINOL 300mg /d...  ~  labs 10/11 showed URIC= 4.7  BACK PAIN, LUMBAR (ICD-724.2) - known degenerative spondylitic disease at L4-5 and L5-Si followed by [facility] for Neurosurg... back pain is doing reasonably well and last checked by [facility] in 2007.  ANXIETY (ICD-300.00) - uses ALPRAZOLAM 0.5mg  as needed.   Preventive Screening-Counseling & Management  Alcohol-Tobacco     Smoking Status: never  Allergies: 1)  Dilaudid 2)  Phenergan (Promethazine Hcl)  Comments:  Nurse/Medical Assistant: The patient's medications and allergies were reviewed with the patient and were updated in the Medication and Allergy Lists.  Past History:  Past Medical History: OBSTRUCTIVE SLEEP APNEA (ICD-327.23) HYPERTENSION (ICD-401.9) CEREBROVASCULAR DISEASE (ICD-437.9) VENOUS INSUFFICIENCY (ICD-459.81)  HYPERCHOLESTEROLEMIA (ICD-272.0) DIABETES MELLITUS (ICD-250.00) OVERWEIGHT (ICD-278.02) HIATAL HERNIA (ICD-553.3) GERD (ICD-530.81) DIVERTICULOSIS OF COLON (ICD-562.10) Hx of HORSESHOE KIDNEY (ICD-753.3) Hx of RENAL CELL CANCER (ICD-189.0) Hx of NEPHROLITHIASIS (ICD-592.0) DEGENERATIVE JOINT DISEASE (ICD-715.90) GOUT (ICD-274.9) BACK PAIN, LUMBAR (ICD-724.2) ANXIETY (ICD-300.00)  Past Surgical History: S/P incisional hernia repair 1/96 by Rexene Alberts S/P sinus surg by Dr Arletha Grippe 12/96 S/P cholecystectomy by Dr Maryagnes Amos 9/04 S/P part nephrectomy (right side) for renal cell ca in his horseshoe kidney in 1994 S/P left carotid endarterectomy in June of 2002 by [facility] S/P vertebral art angioplasties x2 by IR, w/ known basilar art occlusion intestinal surgery (per pt due to "twisted intestines")  Family History: Reviewed history from 07/17/2008 and no changes required. mother deceased age 88 father deceased age 47 from heart attack 1 sibling alive age 66  no PMH 1 sibling deceased age 60 from breast cancer No FH of Colon Cancer:  Social  History: Reviewed history from 07/17/2008 and no changes required. never smoked does not exercise caffeine---1 cup daily no etoh married 2 children  Review of Systems      See HPI       The patient complains of dyspnea on exertion.  The patient denies anorexia, fever, weight loss, weight gain, vision loss, decreased hearing, hoarseness, chest pain, syncope, peripheral edema, prolonged cough, headaches, hemoptysis, abdominal pain, melena, hematochezia, severe indigestion/heartburn, hematuria, incontinence, muscle weakness, suspicious skin lesions, transient blindness, difficulty walking, depression, unusual weight change, abnormal bleeding, enlarged lymph nodes, and angioedema.    Vital Signs:  Patient profile:   70 year old male Height:      66 inches Weight:      210 pounds BMI:     34.02 O2 Sat:      98 % on Room air Temp:     97.1 degrees F oral Pulse rate:   80 / minute BP sitting:   140 / 80  (left arm) Cuff size:   regular  Vitals Entered By: Randell Loop CMA (January 15, 2010 9:50 AM)  O2 Sat at Rest %:  98 O2 Flow:  Room air CC: 10 month ROV & review of mult medical problems... Is Patient Diabetic? Yes Pain Assessment Patient in pain? no      Comments MEDS UPDATED TODAY WITH PT   Physical Exam  Additional Exam:  WD, WN, 69 y/o WM in NAD...  GENERAL:  Alert & oriented; pleasant & cooperative... HEENT:  Forrest/AT, EOM-wnl, PERRLA, EACs-clear, TMs-wnl, NOSE-clear, THROAT-clear & wnl. NECK:  Supple w/ fairROM; no JVD; s/p left CAE, +bruits; no thyromegaly or nodules palpated; no lymphadenopathy. CHEST:  Clear to P & A; without wheezes/ rales/ or rhonchi heard... HEART:  Regular Rhythm; gr 1/6 SEM without rubs or gallops detected... ABDOMEN:  Soft & nontender; normal bowel sounds; no organomegaly or masses palpated... EXT: without deformities, mild arthritic changes; no varicose veins/ +venous insuffic/ 1+ edema. NEURO:  CN's intact; no focal neuro deficits... DERM:   No lesions noted; no rash etc...    MISC. Report  Procedure date:  01/15/2010  Findings:      BMP (METABOL)   Sodium                    145 mEq/L                   135-145   Potassium            [H]  5.7 mEq/L  3.5-5.1   Chloride             [H]  114 mEq/L                   96-112   Carbon Dioxide            25 mEq/L                    19-32   Glucose              [H]  180 mg/dL                   16-10   BUN                       21 mg/dL                    9-60   Creatinine                1.3 mg/dL                   4.5-4.0   Calcium                   9.7 mg/dL                   9.8-11.9   GFR                       58.49 mL/min                >60  Hepatic/Liver Function Panel (HEPATIC)   Total Bilirubin           0.6 mg/dL                   1.4-7.8   Direct Bilirubin          0.1 mg/dL                   2.9-5.6   Alkaline Phosphatase      85 U/L                      39-117   AST                       21 U/L                      0-37   ALT                       22 U/L                      0-53   Total Protein             6.4 g/dL                    2.1-3.0   Albumin                   3.6 g/dL                    8.6-5.7  CBC Platelet w/Diff (CBCD)   White Cell Count          6.1 K/uL  4.5-10.5   Red Cell Count       [L]  3.98 Mil/uL                 4.22-5.81   Hemoglobin                13.5 g/dL                   16.1-09.6   Hematocrit           [L]  38.3 %                      39.0-52.0   MCV                       96.3 fl                     78.0-100.0   Platelet Count            183.0 K/uL                  150.0-400.0   Neutrophil %              59.1 %                      43.0-77.0   Lymphocyte %              21.3 %                      12.0-46.0   Monocyte %                9.7 %                       3.0-12.0   Eosinophils%         [H]  9.0 %                       0.0-5.0   Basophils %               0.9 %                        0.0-3.0  Comments:      Lipid Panel (LIPID)   Cholesterol          [H]  210 mg/dL                   0-454   Triglycerides        [H]  161.0 mg/dL                 0.9-811.9   HDL                       14.78 mg/dL                 >29.56  Cholesterol LDL - Direct                             149.1 mg/dL  TSH (TSH)   FastTSH                   2.59 uIU/mL                 0.35-5.50  Uric Acid (URIC)   Uric Acid                 4.7 mg/dL                   1.6-1.0   Prostate Specific Antigen (PSA)   PSA-Hyb                   0.95 ng/mL                  0.10-4.00  Hemoglobin A1C (A1C)   Hemoglobin A1C       [H]  8.4 %                       4.6-6.5   Impression & Recommendations:  Problem # 1:  OBSTRUCTIVE SLEEP APNEA (ICD-327.23) Still not using CPAP (his choice) & denies sleep disordered breathing... does not want re-evaluation...  Problem # 2:  HYPERTENSION (ICD-401.9) Borderline control w/o meds... needs to elim sodium & lose weight... discussed strtegies w/ pt. Orders: TLB-BMP (Basic Metabolic Panel-BMET) (80048-METABOL) TLB-Hepatic/Liver Function Pnl (80076-HEPATIC) TLB-CBC Platelet - w/Differential (85025-CBCD) TLB-Lipid Panel (80061-LIPID) TLB-TSH (Thyroid Stimulating Hormone) (84443-TSH) TLB-Uric Acid, Blood (84550-URIC) TLB-PSA (Prostate Specific Antigen) (84153-PSA) TLB-A1C / Hgb A1C (Glycohemoglobin) (83036-A1C)  Problem # 3:  CEREBROVASCULAR DISEASE (ICD-437.9) He's a severe vasculopath & needs on-going monitoring by IR/ DrDeveshwar- appt scheduled for pt. Orders: Radiology Referral (Radiology)  Problem # 4:  HYPERCHOLESTEROLEMIA (ICD-272.0) He stopped prev Crestor on his own but FLP w/ LDL 149 & goal for his vasc dis is <70... I rec restart Crestor at 20mg /d, he states he will consider my recommendation and decide what he wants to do... The following medications were removed from the medication list:    Crestor 10 Mg Tabs (Rosuvastatin calcium) ..... One tablet by  mouth once daily  Problem # 5:  DIABETES MELLITUS (ICD-250.00) He stopped the Actos on his own & ?compliance w/ his other meds... A1c is up to 8.4 & pt is advised to take meds regularly & incr the GLYBURIDE to 10mg Bid before considering Actos restart due to $$ issues... he will consider my recommendations and let me know what he decides... The following medications were removed from the medication list:    Actos 30 Mg Tabs (Pioglitazone hcl) .Marland Kitchen... Take 1 tab by mouth once daily... His updated medication list for this problem includes:    Ecotrin 325 Mg Tbec (Aspirin) .Marland Kitchen... Take 1 tab by mouth once daily...    Metformin Hcl 500 Mg Tabs (Metformin hcl) .Marland Kitchen... Take two tablets by mouth two times a day    Glyburide 5 Mg Tabs (Glyburide) .Marland Kitchen... Take one tablet by mouth two times a day  Orders: TLB-BMP (Basic Metabolic Panel-BMET) (80048-METABOL) TLB-Hepatic/Liver Function Pnl (80076-HEPATIC) TLB-CBC Platelet - w/Differential (85025-CBCD) TLB-Lipid Panel (80061-LIPID) TLB-TSH (Thyroid Stimulating Hormone) (84443-TSH) TLB-Uric Acid, Blood (84550-URIC) TLB-PSA (Prostate Specific Antigen) (84153-PSA) TLB-A1C / Hgb A1C (Glycohemoglobin) (83036-A1C)  Problem # 6:  OVERWEIGHT (ICD-278.02) Weight reduction thru diet + exercise are the keys to success...  Problem # 7:  DIVERTICULOSIS OF COLON (ICD-562.10) GI per DrPatterson>  continue same meds.  Problem # 8:  Hx of RENAL CELL CANCER (ICD-189.0) GU per DrPeterson w/ yearly ROV...  Problem # 9:  DEGENERATIVE JOINT DISEASE (ICD-715.90) He has mod DJD, gout, etc... stable on current Rx... His updated medication list for this problem includes:    Ecotrin 325 Mg Tbec (Aspirin) .Marland Kitchen... Take 1 tab by mouth  once daily...  Problem # 10:  ANXIETY (ICD-300.00) Stable on Alprazolam Rx... His updated medication list for this problem includes:    Alprazolam 0.5 Mg Tabs (Alprazolam) .Marland Kitchen... Take 1/2 to 1 tab by mouth tis as needed for nerves...  Complete  Medication List: 1)  Ecotrin 325 Mg Tbec (Aspirin) .... Take 1 tab by mouth once daily.Marland KitchenMarland Kitchen 2)  Plavix 75 Mg Tabs (Clopidogrel bisulfate) .... Take 1 tab by mouth once daily.Marland KitchenMarland Kitchen 3)  Metformin Hcl 500 Mg Tabs (Metformin hcl) .... Take two tablets by mouth two times a day 4)  Glyburide 5 Mg Tabs (Glyburide) .... Take one tablet by mouth two times a day 5)  Prilosec Otc 20 Mg Tbec (Omeprazole magnesium) .... Take 1 tablet by mouth once a day 6)  Colace 100 Mg Caps (Docusate sodium) .... Take one by mouth once daily 7)  Miralax Powd (Polyethylene glycol 3350) .... Take one scoop in 8 ounces of water two times a day as needed 8)  Allopurinol 300 Mg Tabs (Allopurinol) .... Take 1 tablet by mouth once a day 9)  Alprazolam 0.5 Mg Tabs (Alprazolam) .... Take 1/2 to 1 tab by mouth tis as needed for nerves... 10)  Multivitamins Tabs (Multiple vitamin) .... Take 1 tablet by mouth once a day  Patient Instructions: 1)  Today we updated your med list- see below.... 2)  We refilled your current meds, and did not refill the meds you stopped on your own (Crestor & Actos)... 3)  Today we did your follow up FASTING blood work... please call the "phone tree" in a few days for your lab results... once we see how your Cholesterol & Diabetic control looks you will be able to decide if you want to restart these other meds... 4)  We will arrange for a follow up visit w/ [facility]war at [facility] to reassess the status of the blockages in your blood vessels.Marland KitchenMarland Kitchen 5)  Call for any problems.Marland KitchenMarland Kitchen 6)  Please schedule a follow-up appointment in 6 months, sooner as needed. Prescriptions: ALPRAZOLAM 0.5 MG TABS (ALPRAZOLAM) take 1/2 to 1 tab by mouth tis as needed for nerves...  #100 x 5   Entered and Authorized by:   Michele Mcalpine MD   Signed by:   Michele Mcalpine MD on 01/15/2010   Method used:   Print then Give to Patient   RxID:   [id number] ALLOPURINOL 300 MG TABS (ALLOPURINOL) Take 1 tablet by mouth once a day  #90 x  3   Entered and Authorized by:   Michele Mcalpine MD   Signed by:   Michele Mcalpine MD on 01/15/2010   Method used:   Print then Give to Patient   RxID:   [id number] GLYBURIDE 5 MG  TABS (GLYBURIDE) take one tablet by mouth two times a day  #180 x 3   Entered and Authorized by:   Michele Mcalpine MD   Signed by:   Michele Mcalpine MD on 01/15/2010   Method used:   Print then Give to Patient   RxID:   [id number] METFORMIN HCL 500 MG TABS (METFORMIN HCL) take two tablets by mouth two times a day  #360 x 3   Entered and Authorized by:   Michele Mcalpine MD   Signed by:   Michele Mcalpine MD on 01/15/2010   Method used:   Print then Give to Patient   RxID:   [id number] PLAVIX 75 MG TABS (CLOPIDOGREL BISULFATE) take 1 tab by  mouth once daily...  #90 x 3   Entered and Authorized by:   Michele Mcalpine MD   Signed by:   Michele Mcalpine MD on 01/15/2010   Method used:   Print then Give to Patient   RxID:   [id number]    Immunization History:  Influenza Immunization History:    Influenza:  historical (01/07/2010)

## 2010-05-13 NOTE — Progress Notes (Signed)
Summary: refills---needs ov--scheduled to see SN  Phone Note Call from Patient Call back at Home Phone [phone]   Caller: Patient Call For: nadel Reason for Call: Refill Medication Summary of Call: Needs refills on: alprazolam 0.5mg ,metformin hcl 500mg , glyburide 5mg .//[facility] [place] Initial call taken by: Darletta Moll,  January 14, 2010 2:42 PM  Follow-up for Phone Call        pt last saw SN 03/2009 and was told to f/u in 6 months with fasting bloodwork.  No appts scheduled.  Leigh ok'd refilled back in august x 1 only because pt needed ov.  called and spoke with pt.  pt aware of the above information.  there was an opening on SN's scheduled for tomorrow- pt agreed to come in and be seen at 9:30am.  Pt will come fasting.  Aundra Millet Reynolds LPN  January 14, 2010 3:18 PM

## 2010-05-13 NOTE — Progress Notes (Signed)
Summary: RX REQUEST  Phone Note Call from Patient Call back at Home Phone [phone]   Caller: Patient Call For: NADEL Summary of Call: PT REQUESTS RX FOR CRESTOR. [facility] [place].  Initial call taken by: Tivis Ringer, CNA,  January 21, 2010 4:13 PM  Follow-up for Phone Call        Per lab append from 10.5.11, pt needs to start on Crestor 20mg  and increase glyburide to 10mg  two times a day. Pt would like 30 day supply of crestor sent to  [facility] and is aware to take 2 tablets of glyburide bid.  He verbalized understanding.  Rx sent -- pt aware.  Also, called spoke with Tresa Endo at [place].  Informed her to change glyburide sig to 2 tablets two times a day.  She verbalized understanding. Follow-up by: Gweneth Dimitri RN,  January 21, 2010 4:35 PM    New/Updated Medications: GLYBURIDE 5 MG  TABS (GLYBURIDE) take two tablets by mouth two times a day CRESTOR 20 MG TABS (ROSUVASTATIN CALCIUM) Take 1 tab by mouth at bedtime Prescriptions: CRESTOR 20 MG TABS (ROSUVASTATIN CALCIUM) Take 1 tab by mouth at bedtime  #30 x 6   Entered by:   Gweneth Dimitri RN   Authorized by:   Michele Mcalpine MD   Signed by:   Gweneth Dimitri RN on 01/21/2010   Method used:   Electronically to        [facility] (retail)       [address]       [place], [place]  [address]       Ph: [phone]       Fax: [phone]   RxID:   [id number]

## 2010-07-15 LAB — CREATININE, SERUM
Creatinine, Ser: 1.44 mg/dL (ref 0.4–1.5)
GFR calc Af Amer: 59 mL/min — ABNORMAL LOW (ref >60)
GFR calc non Af Amer: 49 mL/min — ABNORMAL LOW (ref >60)

## 2010-07-23 LAB — DIFFERENTIAL
Eosinophils Absolute: 0.4 10*3/uL (ref 0.0–0.7)
Eosinophils Relative: 7 % — ABNORMAL HIGH (ref 0–5)
Lymphocytes Relative: 17 % (ref 12–46)
Lymphs Abs: 1.1 10*3/uL (ref 0.7–4.0)
Monocytes Relative: 8 % (ref 3–12)
Neutrophils Relative %: 67 % (ref 43–77)

## 2010-07-23 LAB — GLUCOSE, CAPILLARY
Glucose-Capillary: 105 mg/dL — ABNORMAL HIGH (ref 70–99)
Glucose-Capillary: 129 mg/dL — ABNORMAL HIGH (ref 70–99)

## 2010-07-23 LAB — CBC
HCT: 40.8 % (ref 39.0–52.0)
MCV: 97 fL (ref 78.0–100.0)
RBC: 4.2 MIL/uL — ABNORMAL LOW (ref 4.22–5.81)
WBC: 6.1 10*3/uL (ref 4.0–10.5)

## 2010-07-23 LAB — BASIC METABOLIC PANEL
Chloride: 108 mEq/L (ref 96–112)
GFR calc Af Amer: >60 mL/min (ref >60)
Potassium: 4.7 mEq/L (ref 3.5–5.1)

## 2010-07-23 LAB — PROTIME-INR: INR: 1.1 (ref 0.00–1.49)

## 2010-08-07 ENCOUNTER — Other Ambulatory Visit: Payer: Self-pay | Admitting: Pulmonary Disease

## 2010-08-11 ENCOUNTER — Other Ambulatory Visit: Payer: Self-pay | Admitting: Pulmonary Disease

## 2010-08-26 NOTE — Procedures (Signed)
NAME:  Peter Nicholson, Peter Nicholson NO.:  [id number]   MEDICAL RECORD NO.:  [id number]          PATIENT TYPE:  OUT   LOCATION:  SLEEP CENTER                 [facility]AC[facility]  [facility]   PHYSICIAN:  Barbaraann Share, MD,FCCPDATE OF BIRTH:  1940-03-11   DATE OF STUDY:  03/01/2007                            NOCTURNAL POLYSOMNOGRAM   REFERRING PHYSICIAN:  [name], MD   REFERRING PHYSICIAN:  Dr. Alroy Dust.   INDICATION FOR STUDY:  Hypersomnia with sleep apnea.   EPWORTH SLEEPINESS SCORE:  12   SLEEP ARCHITECTURE:  The patient had a total sleep time of 311 minutes  with very little slow wave sleep and only 71 minutes of REM.  Sleep  onset latency was rapid at 3.5 minutes and REM onset was prolonged at  140 minutes.  Sleep efficiency was decreased at 78%.   RESPIRATORY DATA:  The patient underwent split night protocol where he  was found to have 39 obstructive events in the first 125 minutes of  sleep; this gave him an apnea-hypopnea index of 19 events per hour  during the diagnostic portion of the study.  By protocol, the patient  was then placed on a CPAP mask which was not documented by the sleep  technician and CPAP pressure was increased incrementally.  A pressure of  11 cm was reached during the end of the night with no breakthrough  events on this pressure; however, the patient was on this level only 38  minutes and never achieved REM at this level.  The patient then asked  for the study to be discontinued.   OXYGEN DATA:  There was O2 desaturation as low at 78% with the patient's  obstructive events.   CARDIAC DATA:  No clinically significant arrhythmias were noted.   MOVEMENT/PARASOMNIA:  The patient was found to have 65 leg jerks with an  index of 13 per hour; however, none of these resulted in an arousal.   IMPRESSIONS/RECOMMENDATIONS:  1. Split night study reveals mild to moderate obstructive sleep apnea      with an apnea-hypopnea index of 19 events per hour  during the      diagnostic portion of the study.  The patient was then placed on      continuous positive airway pressure and titrated to a final      pressure of 11 cm.  However, the patient spent very little time on      this pressure,      and never achieved REM.  It is unclear whether this is an optimal      pressure for the patient or not, but does represent a good starting      point.      Barbaraann Share, MD,FCCP  Diplomate, American Board of Sleep  Medicine  Electronically Signed     [facility]  D:  03/19/2007 11:38:05  T:  03/20/2007 09:13:25  Job:  [id number]

## 2010-08-26 NOTE — Procedures (Signed)
CAROTID DUPLEX EXAM   INDICATION:  Follow-up evaluation of right carotid bruit.   HISTORY:  Diabetes:  Orally controlled.  Cardiac:  No.  Hypertension:  Yes.  Smoking:  No.  Previous Surgery:  Left carotid endarterectomy with Dacron patch  angioplasty on 09/20/98.  CV History:  Previous duplex on 07/31/03 revealed 1-39% right ICA  stenosis and 1-39% left ICA stenosis, status post endarterectomy.  Amaurosis Fugax No, Paresthesias No, Hemiparesis No.                                       RIGHT             LEFT  Brachial systolic pressure:         184               188  Brachial Doppler waveforms:         Triphasic         Triphasic  Vertebral direction of flow:        Antegrade         Antegrade  DUPLEX VELOCITIES (cm/sec)  CCA peak systolic                   92                86  ECA peak systolic                   167               105  ICA peak systolic                   96                61  ICA end diastolic                   37                25  PLAQUE MORPHOLOGY:                  Calcified         None  PLAQUE AMOUNT:                      Mild              None  PLAQUE LOCATION:                    Proximal ICA      None   IMPRESSION:  1. 20-39% right internal carotid artery stenosis.  2. No left internal carotid artery stenosis, status post      endarterectomy.  3. Mild right external carotid artery stenosis.   ___________________________________________  Quita Skye Hart [place], [name]   MC/MEDQ  D:  06/12/2008  T:  06/12/2008  Job:  [id number]

## 2010-08-26 NOTE — Consult Note (Signed)
VASCULAR SURGERY CONSULTATION   Peter Nicholson, Peter Nicholson  DOB:  [birth year]                                       06/12/2008  ZOXWR#:[id number]   This is a new patient consultation.  The patient is a 70 year old male  patient I managed many years ago for cerebrovascular disease.  He  underwent a left carotid endarterectomy by me in June of 2000 following  an episode of left brain ischemia which consisted of some right upper  extremity weakness and numbness.  He had an uneventful course and has  done well neurologically with the exception of some posterior  circulation TIAs which happened after I last him in 2005.  He required  angioplasty of his vertebral artery on one or two occasions by Dr.  Corliss Skains and also has an known basilar artery occlusion.  He is on  chronic Coumadin therapy but has not been seen in our office in 5 years.  He denies any current symptoms of cerebral ischemia such as hemiparesis,  aphasia, amaurosis fugax, diplopia, blurred vision or syncope.  He also  denies any chest pain, dyspnea on exertion but does have sleep apnea.   PAST MEDICAL HISTORY:  1. Hyperlipidemia.  2. Non-insulin-dependent diabetes mellitus.  3. Negative for coronary artery disease or stroke.   PAST SURGICAL HISTORY:  Includes a right nephrectomy for renal cell  carcinoma.  The patient did have a horseshoe kidney and also had  intestinal obstruction postoperatively.   FAMILY HISTORY:  Positive for coronary artery disease in his father.   SOCIAL HISTORY:  He is married, has two children and works as a Paediatric nurse.  He does not use tobacco or alcohol.   ALLERGIES:  None known.   MEDICATIONS:  Please see health history form.   PHYSICAL EXAMINATION:  Vital signs:  Blood pressure is 151/90 in the  left arm, heart rate is 95, respirations 14.  General:  A healthy-  appearing male in no apparent distress, alert and oriented x3.  Neck:  Is supple, 3+ carotid pulses palpable.  Left  neck incision is well-  healed.  No bruits are audible.  Neurological:  Normal.  No palpable  adenopathy in the neck.  Chest:  Clear to auscultation.  Cardiovascular:  Regular rhythm.  No murmurs.  Abdomen:  Soft, nontender with no masses.  He has 3+ femoral pulses bilaterally with well-perfused lower  extremities.   Carotid duplex exam today looks very good with low flow reduction of  endarterectomy site on the left side and the right internal carotid also  is widely patent.  I reassured him regarding these findings and I do not  think he will need routine followup since his carotids are widely patent  and he is 10 years post his left carotid surgery on the left.  I will be  happy to see him again in the future as necessary if he develops any  symptomatology.   Quita Skye Hart [place], [name]  Electronically Signed  JDL/MEDQ  D:  06/12/2008  T:  06/13/2008  Job:  [id number]   cc:   [name], MD

## 2010-08-29 NOTE — H&P (Signed)
[place]. [facility]  Patient:    Peter Nicholson, Peter Nicholson                         MRN: [id number] Adm. Date:  [id number] Attending:  Fenton Malling                         History and Physical  DATE OF BIRTH:  1940/12/19.  CHIEF COMPLAINT:  Transient loss of vision and left leg numbness.  HISTORY OF PRESENT ILLNESS:  This is one of several [facility] hospitalizations for this 70 year old man with a long and complex history of cerebrovascular disease.  To summarize briefly, in June 2000, he presented with a left upper extremity weakness and numbness, was found to have critical right carotid stenosis and underwent right carotid endarterectomy.  Then in March 2001, he was admitted with left body weakness in the face, arm, and leg. MRA at that time revealed a midbasilar occlusion, and he was placed on Coumadin.  He underwent angiography in July of last year, which revealed a 95% narrowing of the left vertebrobasilar junction.  He underwent angioplasty of that lesion with a reduction down to 40% narrowing.  His Coumadin was discontinued, he was changed to aspirin and Plavix at that time.  He was seen in the office in February 2002 with the current left leg symptoms, and there was a question that he was having new events at that time.  He underwent a repeat angiography in April, at which time he had stable disease, and his aspirin and Plavix were continued.  He comes to the emergency room today for two new symptoms.  The first is that he has had more severe, persistent numbness of the left lower extremity for the past one two days and feels that there is subjective weakness in the extremity.  The second is an episode of loss of consciousness, which occurred about a week ago as he was driving his car.  He says he just completely lost his peripheral vision and almost could not see at all.  The episode lasted about 10 seconds.  There was no clear association with any  pain or weakness. He has had no other similar episodes like that.  There was no associated loss of consciousness and no clear slurring of his speech.  PAST MEDICAL HISTORY:  Remarkable for hypertension, high cholesterol, and a right nephrectomy for renal cell carcinoma, as well as the aforementioned cerebrovascular disease.  FAMILY HISTORY:  Remarkable for coronary artery disease in his father and grandfather.  SOCIAL HISTORY:  He lives at home with his wife and is normally independent in his activities of daily living.  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  He does not drink or smoke.  ALLERGIES:  DILAUDID and FENTANYL.  MEDICATIONS:  Aspirin, Plavix, Glucophage, Actos, allopurinol, and Lipitor.  REVIEW OF SYSTEMS:  On specific questioning, the patient denies headache, fever, chills, weight gain or loss, vision changes except as noted above, chest pain, shortness of breath, nausea, vomiting, diarrhea, bowel or bladder symptoms, skin rash, joint pain.  He did note that he has had a sore throat for the past few days.  PHYSICAL EXAMINATION:  VITAL SIGNS:  Temperature 97.4, blood pressure 152/75, pulse 72, respirations 24.  GENERAL:  He is alert, in no evident distress.  HEENT:  Cranium is normocephalic and atraumatic.  Oropharynx is benign.  NECK:  Supple  without bruit.  CHEST:  Clear to auscultation.  CARDIAC:  Regular rate and rhythm without murmurs.  ABDOMEN:  Soft, nontender, with no hepatosplenomegaly.  EXTREMITIES:  There is mild pitting edema of the left lower extremity.  He has 2+ pulses.  NEUROLOGIC:  Mental status:  He is awake, alert, completely oriented.  Speech is fluent and not dysarthric.  Mood is euthymic and affect appropriate. Cranial nerves:  Funduscopic exam is benign.  Pupils are equal and briskly reactive.  Extraocular movements are normal without nystagmus.  Visual fields are full to confrontation.  Face, tongue, and palate all move normally in  the midline.  Motor exam:  Normal bulk and tone.  No atrophy or fasciculations. Normal strength in all tested extremity muscles.  Sensation is intact to light touch, pinprick, and vibration in all extremities.  Exam of rapid alternating movements reveals decreased speed of left foot tapping, otherwise intact. Finger-to-nose is performed well.  Reflexes are 2+ and symmetric.  Toes are downgoing.  Gait is normal.  He is able to heel-toe and tandem walk.  LABORATORY DATA:  CT of the head is personally reviewed and does not reveal any acute events.  CBC is unremarkable.  LFTs are normal.  BMET is remarkable for a slightly elevated BUN of 24 and a glucose of 137.  Coagulation studies are normal.  IMPRESSION:  Vertebrobasilar insufficiency in a man with a history of known vertebrobasilar disease and previous angioplasty.  Although he had stable disease on angiogram in April, he has had a recent interval symptom and thus needs to be reassessed.  PLAN: 1. Heparin for now. 2. Angiography in a.m.  If there is a lesion amenable to angioplasty, will    consider that at that time.  If not, he will need to be switched to    Coumadin.DD:  09/30/00 TD:  10/01/00 Job: [id number]

## 2010-08-29 NOTE — Op Note (Signed)
[place]. [facility]  Patient:    Peter Nicholson, Peter Nicholson                       MRN: [id number] Proc. Date: 06/13/99 Adm. Date:  [id number] Disc. Date: [id number] Attending:  Emeterio Reeve                           Operative Report  PREOPERATIVE DIAGNOSIS:  Metal fragment under right eye.  POSTOPERATIVE DIAGNOSIS:  Metal fragment under right eye.  OPERATION PERFORMED:  Excision of metal fragment under right eye.  SURGEON:  Payton Doughty, [name]  ANESTHESIA:  General esophageal ____________  COMPLICATIONS:  None.  DESCRIPTION OF PROCEDURE:  This is a 70 year old gentleman with a metal fragment under his right eye.  He can not have an MRI because of it.  The patient was taken to the operating room, smoothly anesthetized.  Following prep and drape in the usual sterile fashion a small elliptical incision was made over the palpable metal object under the right eye.  It was easily visualized and removed with a small kin tag that was attached to it.  It was irrigated and hemostasis assured and the incision closed with 5-0 nylon in interrupted fashion.  Betadine dressing was applied.  The patient returned to the recovery room in good condition. DD:  06/13/99 TD:  06/14/99 Job: [id number]

## 2010-08-29 NOTE — Consult Note (Signed)
[place]. [facility]  Patient:    Peter Nicholson, Peter Nicholson                         MRN: [id number] Proc. Date: 03/14/00 Adm. Date:  [id number] Attending:  Cathren Laine Dictator:   [id number]                          Consultation Report  HISTORY OF PRESENT ILLNESS:  This 70 year old gentleman is a patient well known to me, and he had the onset earlier this week of some low-grade fever and some voiding troubles, and he saw Dr. [name], and he has been on Cipro for three days.  This morning he developed increasing problems voiding. He can only void a drop or two, and feels suprapubic pressure.  He has had no high fever, no hematuria.  PAST MEDICAL HISTORY/MEDICATIONS:  He has had a history of a partial nephrectomy for a horseshoe kidney, and renal cell carcinoma.  He was due to come back to see me for an evaluation of his prostate.  He is a diabetic, and he is on Glucophage.  He is also on Lipitor for hypercholesterolemia, and he takes allopurinol.  He is on Actos, in addition to his Glucophage.  He is also on Plavix, aspirin, and Xanax.  SOCIAL HISTORY:  He does not smoke.  He does not drink alcohol.  PHYSICAL EXAMINATION:  GENERAL:  He is alert and oriented.  SKIN:  Warm and dry.  ABDOMEN:  He is complaining about suprapubic pressure.  On examination of the abdomen it reveals suprapubic fullness.  There is no CVA tenderness.  GENITOURINARY:  The penis, urethra, meatus, scrotum, testicles, and epididymes are without lesions.  I inserted a Foley catheter and obtained 600 cc of clear urine.  The Foley catheter was hooked to a leg bag.  He was started on Flomax 0.4 mg q.d.  He will take out his Foley at home on Tuesday, March 16, 2000, and call me that afternoon to let me know how he is getting along.  Will get followup for him after that.  He was sent home in an improved condition.  He will also finish up his Cipro. DD:  03/14/00 TD:  03/14/00 Job:  [id number]

## 2010-08-29 NOTE — Discharge Summary (Signed)
[place]. [facility]  Patient:    Peter Nicholson, Peter Nicholson                         MRN: [id number] Adm. Date:  [id number] Disc. Date: 10/10/00 Attending:  Fenton Malling                           Discharge Summary  DATE OF BIRTH: 1940-12-17  ADMISSION DIAGNOSES:  1. Vertebrobasilar insufficiency with previous history of known     vertebrobasilar disease and vertebral angioplasty.  2. Transient visual loss, likely secondary to #1.  3. Left lower lobe numbness, etiology unclear.  4. Hypertension.  5. Hypercholesterolemia.  6. History of renal cell carcinoma.  7. Diabetes mellitus.  DISCHARGE DIAGNOSES:  1. Vertebrobasilar insufficiency with recurrent bilateral vertebral disease,     status post uneventful angioplasty of right vertebral artery.  2. Chronic anticoagulation secondary to above, with therapeutic international     normalized ratio at discharge.  3. Transient visual loss, likely secondary to above.  4. Left lower extremity numbness, etiology unclear.  5. Hypertension.  6. Hypercholesterolemia.  7. History of right nephrectomy for renal cell carcinoma.  8. Diabetes mellitus.  DISCHARGE CONDITION: Good.  DISCHARGE DIET: Low-fat/low-cholesterol.  DISCHARGE ACTIVITY: Ad lib.  DISCHARGE MEDICATIONS:  1. Coumadin 5 mg p.o. q.d. or as directed (new).  2. Aspirin 325 mg p.o. q.d.  3. Allopurinol 300 mg p.o. q.d.  4. Actos 30 mg p.o. q.d.  5. Lipitor 10 mg p.o. q.d.  6. Glucophage 500 mg p.o. q.d.  7. Stop Plavix.  STUDIES:  1. MRI of the brain performed September 30, 2000, revealing no evidence of acute     infarct and old infarct in the left cerebellum.  2. Four-vessel cerebral angioplasty performed October 01, 2000, revealing     recurrent disease in the left vertebral artery with a tight focal     stenosis at the left vertebrobasilar junction.  SURGICAL PROCEDURES: Angioplasty of the left vertebral artery performed by Dr. Corliss Skains under  general anesthesia without apparent complication and with improvement of the stenosis to approximately 35-40% residual.  HOSPITAL COURSE: Please refer to the admission History and Physical for full details.  Briefly, this is a 70 year old man with multiple vascular risk factors and known history of bilateral vertebral artery disease, who has had a previous vertebral angioplasty, admitted with an episode of transient visual loss and concern of recurrent disease.  He also had left lower extremity numbness, which he felt had gotten worse over the past couple of days.  MRI was performed to rule out acute infarct, which did not appear to be present. He underwent catheter angiography for delineation of the extent of disease in his vertebrobasilar system and it did reveal a recurrent tight focal stenosis of the left vertebrobasilar junction.  He underwent angioplasty of this lesion on October 04, 2000 without difficulty.  Because the lesion had occurred on aspirin and Plavix combination antiplatelet therapy it was felt that he should be anticoagulated and Coumadin was started on October 05, 2000.  By October 10, 2000 he had achieved a therapeutic INR.  He remained neurologically stable throughout his entire hospitalization with no symptoms and essentially normal neurologic examination.  He was discharged home on October 10, 2000.  FOLLOW-UP: He was given a prescription to have a prothrombin time with INR checked on October 13, 2000.  Dr. Thad Ranger will  notify him of the results.  He should also call [facility] at [phone] to make a follow-up appointment with Dr. Thad Ranger in two months. DD:  10/10/00 TD:  10/10/00 Job: [id number]

## 2010-08-29 NOTE — H&P (Signed)
[place]. [facility]  Patient:    Peter Nicholson, Peter Nicholson                         MRN: [id number] Adm. Date:  [id number] Attending:  Glean Hess D                         History and Physical  CHIEF COMPLAINT: Left-sided weakness.  HISTORY OF PRESENT ILLNESS: The patient is a 70 year old man who this afternoon  around 6 p.m. developed sudden onset of left-sided weakness involving the face,  arm, and leg.  This lasted about ten minutes.  He also described last week having an episode of dizziness and today his wife noted also that he was staggering around noon.  He has a prior history of TIA about a year ago and he has had a left carotid endarterectomy.  He is currently on aspirin 325 mg q.d. for stroke prevention.  PAST MEDICAL HISTORY:  1. Diabetes mellitus.  2. Hypertension.  3. Left carotid endarterectomy.  4. As per the patient a carotid ultrasound done about three weeks ago was     "within normal limits".  5. Prior history of kidney cancer on the right side, status post nephrectomy.  MEDICATIONS:  1. Lipitor 10 mg q.d.  2. Allopurinol 300 mg q.d.  3. Glucophage 500 mg q.d.  4. Accupril 20 mg q.d.  5. Aspirin 325 mg q.d.  PRIMARY CARE PHYSICIAN: [name], [name]  ALLERGIES: DILAUDID, FENTANYL.  SOCIAL HISTORY: Occupation, Corporate treasurer.  He lives at home with his wife. Nonsmoker, nondrinker.  FAMILY HISTORY: Significant for coronary artery disease.  His grandfather and his father died of heart attacks and one aunt and uncle by heart disease as well.  REVIEW OF SYSTEMS: As per History of Present Illness.  PHYSICAL EXAMINATION:  VITAL SIGNS: Blood pressure 166/82, pulse 84, respirations 20, temperature 97.9  degrees.  HEENT: Head normocephalic, atraumatic.  NECK: Supple, no bruits.  LUNGS: Clear bilaterally.  HEART:  Heart sounds regular rhythm without murmurs.  ABDOMEN: Soft, bowel sounds present, no  hepatosplenomegaly.  EXTREMITIES: No cyanosis or edema.  NEUROLOGIC: Awake and alert and oriented.  Speech fluent.  Memory and language normal.  Cranial nerves 2-12 show pupils round, reactive bilaterally. Extraocular/cephalic movements intact.  Visual fields full to confrontation. Face symmetric.  Tongue midline.  Palate elevates symmetrically.  Motor examination shows strength equal bilaterally.  Reflexes +1 to 2 throughout.  Plantars downgoing.  Coordination and sensation normal.  Gait evaluation was deferred at  this time.  IMPRESSION:  1. Transient ischemic attack.  2. Hypertension.  3. Diabetes mellitus.  4. Status post left carotid endarterectomy for previous transient ischemic attack.  PLAN: The plan, recommendations, diagnosis, condition, and further interventions were discussed at length with the patient and his wife at the bedside.  We will  admit the patient to the hospital for further work-up and testing for cerebrovascular disease.  The subsequent risks for stroke in view of his recent TIA were discussed at length with the patient.  The patient will need further imaging studies of the intracranial and extracranial vessels with MRI/MRA of the brain nd echocardiogram of his heart to study embolic sources as a cause of TIA as well. In the meanwhile he will be treated with IV fluids, normal saline, for hemodynamic  support, heparin ischemic stroke protocol until his work-up his completed. DD:  07/09/99 TD:  07/10/99  Job: [id number]

## 2010-08-29 NOTE — Discharge Summary (Signed)
[place]. [facility]  Patient:    Peter Nicholson, Peter Nicholson                         MRN: [id number] Adm. Date:  07/09/99 Disc. Date: 07/16/99 Attending:  Glean Hess, [name] CC:         [name], [name] [facility]                           Discharge Summary  DIAGNOSIS: 1. Basilar artery occlusion, symptomatic. 2. Left internal carotid artery stenosis, siphon, asymptomatic. 3. Hypertension. 4. Diabetes mellitus.  PROCEDURES AND INTERVENTIONS: 1. MRI/MRA of the brain. 2. Cerebral catheterization and angiography. 3. Transesophageal echocardiogram.  SUMMARY OF HOSPITALIZATION:  The patient is a 70 year old man who presented to [facility] Emergency Room after a 10-minute episode of left-sided weakness involving the face, arm, and leg.  Symptoms resolved spontaneously.  On admission, neurological examination was essentially normal.  He was admitted to the hospital for further workup and testing for cerebrovascular disease. He underwent MRI/MRA of the brain which showed suggestion of vertebrobasilar disease which is the reason why he underwent a formal cerebral angiogram. This angiogram shows a mid basilar artery occlusion.  The top of the basilar artery was fed via collaterals ______ from the arterial circulation.  There was also evidence of stenosis of the left internal carotid artery siphon estimated between 50 to 70% which is asymptomatic.  Throughout his hospitalization, the patient displayed transient symptoms of weakness and numbness alternating between the right side and the left side of his body that were short lived and lasted only minutes.  His transesophageal echocardiogram showed no abnormalities, no embolic sources as the cause of strokes.  After discussing with the patient the diagnosis, condition, and possible interventions to treat his disease for long-term secondary stroke prevention, it was decided to place the patient on short-term anticoagulation for  the next three months with Coumadin.  He was loaded with 40 mg doses over 4 days, and his INR on discharge was 2.0.  He was discharged on a dose of 6 mg once a day to be followed up at the [facility].  I will be following up this patient in my office on Aug 21, 1999, to consider further interventions in regards to his vessel occlusions and the stenosis.  DISCHARGE MEDICATIONS: 1. Lipitor 10 mg once a day. 2. Allopurinol 300 once a day. 3. Glucophage 500 once a day. 4. Accupril 20 once a day. 5. Aspirin 325 once a day. 6. Coumadin, dose as prescribed. DD:  07/16/99 TD:  07/16/99 Job: [id number]

## 2010-08-29 NOTE — Consult Note (Signed)
[place]  Patient:    Peter Nicholson, Peter Nicholson                         MRN: [id number] Proc. Date: 11/07/99 Adm. Date:  [id number] Disc. Date: [id number] Attending:  Oneal Grout CC:         Maretta Bees. Vonita Moss, [name]; Dr. Bedelia Person                          Consultation Report  HISTORY:  This 70 year old, white male patient who has seen Dr. Vonita Moss in the past was admitted to the hospital for cerebral arteriogram/angioplasty. He was catheterized and anticoagulated for that and now has developed gross hematuria.  He denies any significant pain in the penis or bladder.  He also has no abdominal or flank pain.  He has a significant past urologic history for right radical nephrectomy for renal cell carcinoma and has a known horseshoe kidney, with some mild pyelocaliectasis last studied on April 29, 1998, with IVP.  He has otherwise been doing well and has no complaints.  He has known benign enlargement of the prostate.  He has not had difficulty with hematuria in the past.  PAST MEDICAL HISTORY:  As above.  He also has hypertriglyceridemia, adult-onset diabetes, history of a left carotid endarterectomy.  ALLERGIES:  FENTANYL and DILAUDID.  MEDICATIONS:  1. Lipitor 10 mg.  2. Allopurinol 30 mg.  3. Glucophage 500 mg.  4. Actos 30 mg.  5. Plavix 1 a day.  6. Aspirin.  SOCIAL HISTORY:  He denies chemical or substance abuse, as well as alcohol and tobacco use.  FAMILY HISTORY:  Negative for any GU disease or malignancy.  His father did have coronary artery disease.  REVIEW OF SYSTEMS:  He has some groin pain from his recent catheterization.  A little discomfort in the penis from the Foley catheter, but otherwise no complaints at this time.  PHYSICAL EXAMINATION:  VITAL SIGNS:  He is afebrile, with a pulse of 77, BP 136/66, respirations 20.  GENERAL:  He is alert and oriented x 3.  SKIN:  Warm and dry.  HEENT:  Atraumatic, normocephalic.   PERRLA.  EOMI.  Oropharynx is clear.  NECK:  Supple.  CHEST:  Clear to auscultation.  CARDIOVASCULAR:  Regular rate and rhythm, with murmur.  ABDOMEN:  Soft, midline scar.  No palpable mass or tenderness.  He had no flank mass.  No suprapubic mass or tenderness noted.  He has no inguinal hernias or adenopathy.  GENITOURINARY:  He has a Foley catheter, indwelling.  He has normal male phallus with a normal glans and meatus, but there is blood at the meatus of his penis, with a Foley catheter indwelling.  His scrotum, testicles, epididymis, anus, and perineum is normal.  He has normal rectal tone.  His prostate is 2+ enlarged.  It is smooth and symmetric, but diagonally in the left lobe of the prostate is a firmer area in the midprostate on deep palpation compared to the surrounding tissue.  No extension outside of the prostate.  No abnormality of the seminal vesicles is noted.  He has good peripheral pulses.  He has no deformity of the extremities.  LABORATORY:  His PT was 15.8, INR 1.5.  PTT was greater than 200 yesterday. Today his PT is 15.5, PTT 30, INR is 1.4.  His creatinine is 0.9.  IMPRESSION:  1. Status post right nephrectomy  for renal cell carcinoma.  No evidence of     recurrent at this time time.  2. What appears to be a new prostate nodule, it may be just some benign     prostatic hypertrophy, but I do not see that he had any nodularity noted     to deep palpation of the prostate in the past.  3. Gross hematuria.  This is likely secondary to Foley trauma since he has     blood at the meatus and around the catheter where it exits the penis.  He     does not describe a particularly uncomfortable Foley catheter placement,     but he had no hematuria prior.  He was anticoagulated with heparin, and     this may have exacerbated then.  Since he has no pain and his urine is     clearing without any clots and he is going to be discharged today, I think     this can be worked  up as an outpatient.  PLAN:  1. Can discontinued Foley catheter when patient is able to stand and void.  2. No reason to keep at home tonight.  3. He will force fluids and contact us if he has any difficulty further with     bleeding over the weekend.  4. He was instructed to contact Dr. Vonita Moss next week for appointment for     consideration of IVP and cystoscopy to work up his hematuria.  5. He does have a prostate nodule and he will need to get a PSA on return,     but it is not indicated at this time because of his recent urethral     manipulation with the Foley catheter. DD:  11/07/99 TD:  11/10/99 Job: [id number]

## 2010-08-29 NOTE — Op Note (Signed)
NAME:  Peter Nicholson, Peter Nicholson NO.:  [id number]   MEDICAL RECORD NO.:  [id number]                   PATIENT TYPE:  OIB   LOCATION:  2899                                 FACILITY:  [facility]   PHYSICIAN:  Gita Kudo, [name]              DATE OF BIRTH:  08-19-1940   DATE OF PROCEDURE:  12/26/2002  DATE OF DISCHARGE:                                 OPERATIVE REPORT   PROCEDURE:  Attempted laparoscopic cholecystectomy, open cholecystectomy.   SURGEON:  Gita Kudo, [name]   ASSISTANT:  Rose Phi. Maple Hudson, [name]   ANESTHESIA:  General endotracheal.   PREOPERATIVE DIAGNOSIS:  Gallstones.   POSTOPERATIVE DIAGNOSIS:  Gallstones.   HISTORY OF PRESENT ILLNESS:  A 70 year old male with vague abdominal  symptoms and gallstones noted on CT and ultrasound. He has a significant  past medical history of a right horseshoe kidney nephrectomy for cancer  followed by small bowel obstruction and then later another bout of small  bowel obstruction and an incision hernia repair with mesh. Because of his  medical status, it is felt best to do an elective cholecystectomy rather  than emergency because of his Coumadin and other medications.   FINDINGS:  The abdomen had basically no free peritoneal space. I was unable  to get a port in, in the midline lower and therefore opened him. The  gallbladder itself was not acutely inflamed.   DESCRIPTION OF PROCEDURE:  Under satisfactory general endotracheal  anesthesia, having received IV Ancef, the patient's abdomen prepped and  draped in the standard fashion. A midline opening made in his previous  midline incision and carried down to the abdominal wall. The mesh was  incised and then careful direct and finger dissection used to enter the  abdomen and I was unsuccessful. Therefore it was felt best to proceed to an  open cholecystectomy. The midline was closed with three #0 figure-of-eight  Prolene sutures.   Then a general right  subcostal incision was made, extending as the procedure  unfolded. The abdomen was entered and a fair amount of time spent taking  down adhesions both for visualization and for later closure. Then the  gallbladder was identified and grasped with Kelly clamps. Self retaining  retractors gave good exposure and packing away the rest of the bowel the  gallbladder was removed. The gallbladder cystic duct junction identified and  controlled with multiple clips and divided. Likewise the cystic artery  controlled. Then the gallbladder removed from above downward and the  dissection completed. A portion of the liver capsule adjacent to the  gallbladder was torn and there was some bleeding that was easily controlled  with pressure. After checking for hemostasis, the abdomen was lavaged with  saline. The liver bed had a piece of Surgicel place on it and a JP drain let  out laterally. Following  this, the abdomen closed in layers with running #1 PDS  and #1 Prolene  suture. The skin edges then approximated with staples and sterile absorbent  dressings applied. There were no complications and sponge and needle counts  correct.                                               Gita Kudo, [name]    MRL/MEDQ  D:  12/26/2002  T:  12/26/2002  Job:  [id number]   cc:   [name], [name] [facility]

## 2010-08-29 NOTE — Discharge Summary (Signed)
   NAME:  Peter Nicholson, Peter Nicholson                          ACCOUNT NO.:  [id number]   MEDICAL RECORD NO.:  [id number]                   PATIENT TYPE:  INP   LOCATION:  5705                                 FACILITY:  [facility]   PHYSICIAN:  Gita Kudo, [name]              DATE OF BIRTH:  1940-11-09   DATE OF ADMISSION:  12/26/2002  DATE OF DISCHARGE:  12/29/2002                                 DISCHARGE SUMMARY   CHIEF COMPLAINT:  Gallstones.   HISTORY OF PRESENT ILLNESS:  A 70 year old male with gallstones found during  a routine followup for renal cancer.  Not very symptomatic.  Because of  multiple medical problems. It is felt best to proceed while he is in good  condition.  Of note is the fact that he had multiple operations.   LABORATORY STUDIES:  Pathology: Cholecystitis, cholelithiasis.   Chest x-ray:  No active disease.   EKG: Normal and no significant change.   Hemoglobin 14, hematocrit 40, white count 5800.  Elevated glucose of 128.  Other CMET within normal limits.   HOSPITAL COURSE:  On the morning of admission, the patient underwent  attempted laparoscopic cholecystectomy but, because of adhesions, had to be  converted to open.  This was accomplished without difficulty.  Postoperatively, he had a very benign course.  He had no complications.  He  regained bowel function and was comfortable and accordingly discharged on  his third postop day.   DISCHARGE DIAGNOSES:  Cholelithiasis, cholecystitis.   COMPLICATIONS:  Infection.   CONSULTATIONS:  None.   OPERATION:  Open cholecystectomy.   CONDITION ON DISCHARGE:  Good.                                                Gita Kudo, [name]    MRL/MEDQ  D:  01/10/2003  T:  01/10/2003  Job:  [id number]

## 2010-08-29 NOTE — Letter (Signed)
November 18, 2006    Leonides Grills, [name]  [address]  [place], [place] [address]   RE:  MARTE, CELANI  MRN:  [id number]  /  DOB:  1941/04/11   Dear Dr. Lestine Box:   Mr. Remy Voiles is a 70 year old gentleman whom I follow for general  medical purposes; I see him once a year for routine medical followup.  He was last seen in June of this year.  He has a history of severe  arteriosclerotic peripheral vascular disease with severe cerebrovascular  problems and a previous TIA.  His neurologist is Dr. Thad Ranger and he has  had interventional radiology procedures from Dr. Corliss Skains in  consultation with Dr. Hart Rochester.  He is currently taking Coumadin under the  direction of Dr.  Thad Ranger.  He has hypertension and takes atenolol 25  mg p.o. daily and Altace 2.5 mg p.o. daily.  He has adult-onset diabetes  and takes glyburide 2.5 mg p.o. b.i.d. and Metformin 500 mg p.o. b.i.d.  He has hypercholesterolemia which is controlled on Crestor 10 mg p.o.  nightly.  He also has gastroesophageal reflux disease with a large  hiatus hernia and takes Nexium 40 mg daily.  His only other medication  is alprazolam 0.5 mg p.o. t.i.d. that he takes for anxiety.   On reviewing his past medical history, he has had previous nephrectomy  for a renal cell carcinoma which was found on one side of a horseshoe  kidney.  His baseline creatinine is now in the 1.2 range.   Mr. Eichelberger tells me that he is to have left heel surgery because of a  large bone spur and significant pain.  He is stable from the general  medical standpoint and okay for the needed surgery from my perspective.  However, due to his Coumadin, which is controlled by Dr. Thad Ranger, the  patient will need to coordinate his Coumadin therapy with Dr. Thad Ranger  and I suggest you may want to have Dr. Thad Ranger check him prior to this  procedure.  As I recall, his  cerebrovascular condition is quite severe and coming off of Coumadin may  not be in his best  interest.   I hope this information is helpful to you; if I can be of further  assessment, please feel free to contact my office.    Sincerely,      [name], MD  Electronically Signed    SMN/MedQ  DD: 11/18/2006  DT: 11/19/2006  Job #: [id number]

## 2010-09-10 ENCOUNTER — Other Ambulatory Visit: Payer: Self-pay | Admitting: Pulmonary Disease

## 2010-09-10 ENCOUNTER — Telehealth: Payer: Self-pay | Admitting: Pulmonary Disease

## 2010-09-10 MED ORDER — ROSUVASTATIN CALCIUM 20 MG PO TABS: 20.0000 mg | ORAL_TABLET | Freq: Every day | ORAL | Status: DC

## 2010-09-10 NOTE — Telephone Encounter (Signed)
Refill sent. Pt aware.Jennifer Castillo, CMA  

## 2010-09-19 ENCOUNTER — Other Ambulatory Visit: Payer: Self-pay | Admitting: Pulmonary Disease

## 2010-09-25 ENCOUNTER — Other Ambulatory Visit: Payer: Self-pay | Admitting: *Deleted

## 2010-09-25 MED ORDER — ALPRAZOLAM 0.5 MG PO TABS: ORAL_TABLET | ORAL | Status: DC

## 2010-09-30 ENCOUNTER — Encounter: Payer: Self-pay | Admitting: Pulmonary Disease

## 2010-09-30 ENCOUNTER — Ambulatory Visit (INDEPENDENT_AMBULATORY_CARE_PROVIDER_SITE_OTHER): Payer: Medicare Other | Admitting: Pulmonary Disease

## 2010-09-30 DIAGNOSIS — I872 Venous insufficiency (chronic) (peripheral): Secondary | ICD-10-CM

## 2010-09-30 DIAGNOSIS — K219 Gastro-esophageal reflux disease without esophagitis: Secondary | ICD-10-CM

## 2010-09-30 DIAGNOSIS — M545 Low back pain, unspecified: Secondary | ICD-10-CM

## 2010-09-30 DIAGNOSIS — M1990 Unspecified osteoarthritis, unspecified site: Secondary | ICD-10-CM

## 2010-09-30 DIAGNOSIS — I679 Cerebrovascular disease, unspecified: Secondary | ICD-10-CM

## 2010-09-30 DIAGNOSIS — K5730 Diverticulosis of large intestine without perforation or abscess without bleeding: Secondary | ICD-10-CM

## 2010-09-30 DIAGNOSIS — F411 Generalized anxiety disorder: Secondary | ICD-10-CM

## 2010-09-30 DIAGNOSIS — E119 Type 2 diabetes mellitus without complications: Secondary | ICD-10-CM

## 2010-09-30 DIAGNOSIS — I10 Essential (primary) hypertension: Secondary | ICD-10-CM

## 2010-09-30 DIAGNOSIS — C649 Malignant neoplasm of unspecified kidney, except renal pelvis: Secondary | ICD-10-CM

## 2010-09-30 DIAGNOSIS — E7800 Pure hypercholesterolemia, unspecified: Secondary | ICD-10-CM

## 2010-09-30 DIAGNOSIS — E663 Overweight: Secondary | ICD-10-CM

## 2010-09-30 NOTE — Progress Notes (Signed)
Subjective:    Patient ID: Peter Nicholson, male    DOB: 21-Apr-1940, 70 y.o.   MRN: [id number]  HPI 70 y/o WM here for a follow up visit... he has multiple medical problems as noted below...   ~  May 21, 2008:  he is a severe vasculopath w/ vertebral art angioplasty by IR x2, and known basilar art occlusion... he remains on COUMADIN w/ protimes done by DrReynolds, Neuro...  also had left CAE in 2000 by [facility]... he denies any acute problems & feels that he has been doing well... weight = 216#, no change over the last 6months... since his brief hosp in [place] for the fecal impaction- he states "I have done some research & found a lady who does enema therapy"...  ~  April 02, 2009:  since he was here last he's had f/u [facility] 3/10 w/ CDopplers that were unchanged & no further VVS follow up deemed necessary per [facility]... he's also had 2 Q80mo MRI's of Head & Neck by DrDeveshwar w/ bilat carotid siphon region atherosclerosis & narrowing (unchanged), and markedly diseased distal left vertebral & prox basilar art narrowing & irregularity (no change)... they have recommended stopping the Coumadin & he is now on ASA & Plavix.  ~  January 15, 2010:  he's had a good 10 mo he says> active in yard, heats w/ wood & does his own wood splitting... unfortunately he hasn't lost any weight, & not realy on diet... he stopped his Crestor "after talking to many people" and he stopped his Actos as well> f/u fasting labs shows LDL up to 149, and A1c up to 8.4.Marland KitchenMarland Kitchen we discussed options and he will decide what he wants to do about these serious medical problems (he decided to restart the Crestor but refuses the Actos)... due for f/u eval by DrDeveshwar/ IR to check the status of his posterior circ problems...  ~  September 30, 2010:  31mo ROV & he presents for f/u mult medical problems> He stiil cuts his own wood & mows 3 yards for exercise; not on much of a diet by his admission "I just don't eat as much, I feel great &  cut back on sweets".    Known severe cerebrovasc dis w/ prev left CAE 2000 by [facility] & two vertebral art angioplasties by DrDeveshwar for IR in 2001 & 2002; he has been stable w/o cerebral ischemic symptoms on ASA & Plavix since then...     Hx hypercholesterolemia & he had stopped his Crestor on his own last fall> f/u labs showed LDL 149 & he decided to restart the Crestor 20mg /d & has been stable on this- due for f/u FLP now= much improved.    Hx DM on Metformin & Glyburide which was increased at his last OV; he had stopped Actos & refused to restart; states BS at home all ~130-140 range & he is due for A1c check= 11.2 (?what happened?); offered max oral meds vs Levemir start...    He notes loose stools (on Miralax & Colase due to hx severe constip in past); discussed Metformin & rec that he stop the Miralax & Colase for now...    He notes LBP & has been seeing his chiropractor for adjustments for many months now; known degen spondylosis from Neurosurg eval DrRoy in 2007 & he requests referral for further eval...   Problem List:  OBSTRUCTIVE SLEEP APNEA (ICD-327.23) - s/p split night sleep study 11/08- showing an AHI of 19, assoc w/ an O2  sat down to 78%... during the CPAP trial he titrated up to a pressure of 11cm but never ret to REM sleep so optimal pressure is still ???...he uses [facility] for his CPAP needs... ~  12/10: he tells me that he has stopped the CPAP, that he sleeps better without it, not snoring/ rests well, & wakes feeling refreshed... he refuses Sleep Med re-eval etc...  Hx of HYPERTENSION - not currently on medications... BP today= 136/80 and denies HA, fatigue, visual changes, CP, palipit, dizziness, syncope, dyspnea, edema, etc... he states that his home BP checks are "OK".  CEREBROVASCULAR DISEASE (ICD-437.9) - stable on ASA 325mg /d & PLAVIX 75mg /d per DrDeveshwar/ DrLawson/ DrReynolds... he has severe extracranial cerebrovasc disease- esp in the post  circulation, and he is s/p vertebral art angioplasty x 2, and w/ a known basilar art occlusion- prev on Coumadin controlled by Neurology (switched to ASA/ Plavix in 2010)... also followed by Windell Moulding and DrDeveshwar for IR... he had a left CAE in 6/00 by [facility]... ~  CDoppler 2/07 showed 0-39% bilat ICA stenoses, s/p left CAE w/ DPA... ~  eval 3/10 by Windell Moulding w/ CDopplers that showed similar patency bilat & 0-39% bilat ICA stenoses; due to the 10 yr interval DrLawson did not feel he needed any additional follow up. ~  continued f/u DrDevershwar IR w/ MRI/ MRAs- last 12/10 showing bilat carotid siphon region atherosclerosis & narrowing (unchanged), and markedly diseased distal left vertebral & prox basilar art narrowing (no change)...   VENOUS INSUFFICIENCY (ICD-459.81) - he knows to avoid sodium, elevate legs, wear support hose...  HYPERCHOLESTEROLEMIA (ICD-272.0) - back on CRESTOR 20mg /d & tol well (he had stopped it on his own 2011 & decided to restart 10/11). ~  FLP 6/08 showed TChol 153, TG 120, HDL 36, LDL 93 ~  FLP 8/09 showed TChol 131, TG 84, HDL 38, LDL 76 ~  2010:  pt was not fasting for blood work... rec to continue Crestor10 + diet efforts. ~  FLP 10/11 showed TChol 210, TG 161, HDL 40, LDL 149... rec to start Crestor 20mg /d, he will decide. ~  FLP 6/12 on Cres20 showed TChol 106, TG 95, HDL 41, LDL 46... Continue same.  DIABETES MELLITUS (ICD-250.00) - currently on METFORMIN 500mg - 2Bid + GLYBURIDE 5mg - 2Bid (he stopped Actos on his own in 2011). ~  labs 6/08 showed BS= 155, HgA1c= 8.1.Marland Kitchen. (glyburide incr to 5Bid). ~  labs 11/08 showed BS= 352, HgA1c= 8.1.... (Actos added). ~  labs 8/09 showed BS= 128, HgA1c= 7.1.Marland KitchenMarland Kitchen rec- same meds, better diet, get wt down! ~  labs 2/10 showed BS= 249, A1c= 7.3.... Metform incr to 2Bid. ~  labs 12/10 (nonfasting- on all 3 meds) showed BS= 87, A1c= 6.9 ~  labs 10/11 on Metform1000Bid+Gybur5Bid showed BS= 180, A1c= 8.4.Marland KitchenMarland Kitchen rec to incr GLYBUR10Bid +  diet. ~  labd 6/12 on Metform1000Bid+Glybur10Bid showed BS= 196, A1c= 11.2 (?what happened?) ==> rec refer to Endocrine.  OVERWEIGHT (ICD-278.02) - he states that he is not on much of a diet & we reviewed low carb/ no sweets/ low fat diet. ~  weight 8/09 = 216# ~  weight 2/10 = 216# ~  weight 12/10 = 212# ~  weight 10/11 = 210# ~  Weight 6/12 = 206# and he admits to not being on much of a diet...  GERD (ICD-530.81) - prev on Nexium40, he switched to OTC PRILOSEC 20mg /d... ~  EGD 19/03 showed 4cmHH, GERD & esoph stricture dilated... ~  EGD 12/09 by  DrPatterson showed HH & severe erosive esophagitis from GERD...  DIVERTICULOSIS OF COLON (ICD-562.10) - note: he was hosp in [place] briefly 12/09 for fecal impaction; prev on Miralax & Colase but recently noted loose stools on the Metformin 1000mg Bid... ~  colonoscopy 10/03 by DrPatterson showed divertics only... ~  colonoscopy 12/09 by DrPatterson was also normal x for divertics...  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 Hx of RENAL CELL CANCER (ICD-189.0) - he had a partial nephrectomy of the right side of his horseshoe kidney in 1994 due to renal cell ca... still followed by DrPeterson yearly by his hx but we don't have any recent notes... ~  labs 10/11 showed PSA= 0.95  Hx of NEPHROLITHIASIS (ICD-592.0) - remote hx of kidney stones followed by DrPeterson...  DEGENERATIVE JOINT DISEASE (ICD-715.90) - and elbow tendonitis Rx'd OTC Prn...  GOUT (ICD-274.9) - on ALLOPURINOL 300mg /d... ~  labs 10/11 showed URIC= 4.7  BACK PAIN, LUMBAR (ICD-724.2) - known degenerative spondylitic disease at L4-5 and L5-S1 followed by [facility]... back pain is doing reasonably well and last checked by [facility] in 2007. ~  6/12: he reports further back pain w/ eval by Chiropractor showing L4 disc prob & regular adjustments; but now notes incr pain & he requests referral to Neurosurg for further  eval...  ANXIETY (ICD-300.00) - uses ALPRAZOLAM 0.5mg  as needed.   Past Surgical History  Procedure Date  . Incisional hernia repair 1/96    Dr.Leone  . Nasal sinus surgery 12/96    Dr.Redman  . Cholecystectomy 9/04    Dr. Maryagnes Amos  . Partial nephrectomy 1994    renal cell cancer in horseshoe kidney  . Carotid endarterectomy 09/2000    Dr.Lawson  . Veterbral art angioplasty     x2  . Small intestine surgery     Outpatient Encounter Prescriptions as of 09/30/2010  Medication Sig Dispense Refill  . allopurinol (ZYLOPRIM) 300 MG tablet Take 300 mg by mouth daily.        Marland Kitchen ALPRAZolam (XANAX) 0.5 MG tablet Take 1/2 to 1 tablet by mouth three times daily as needed for nerves  90 tablet  0  . aspirin 325 MG EC tablet Take 325 mg by mouth daily.        . clopidogrel (PLAVIX) 75 MG tablet Take 75 mg by mouth daily.        Marland Kitchen docusate sodium (COLACE) 100 MG capsule Take 100 mg by mouth daily.        Marland Kitchen glyBURIDE (DIABETA) 5 MG tablet Take 5 mg by mouth 2 (two) times daily with a meal.        . metFORMIN (GLUCOPHAGE) 500 MG tablet Take 500 mg by mouth 2 (two) times daily with a meal.        . Multiple Vitamin (MULTIVITAMIN) tablet Take 1 tablet by mouth daily.        Marland Kitchen omeprazole (PRILOSEC OTC) 20 MG tablet Take 20 mg by mouth daily.        . polyethylene glycol powder (MIRALAX) powder Take 17 g by mouth 2 (two) times daily. Take one scoop in 8 ounces of water two times a day as needed.       . rosuvastatin (CRESTOR) 20 MG tablet Take 1 tablet (20 mg total) by mouth at bedtime.  30 tablet  0    Allergies  Allergen Reactions  . Hydromorphone Hcl     REACTION: hallucinations  . Promethazine Hcl  REACTION: hallucinations    Review of Systems        See HPI - all other systems neg except as noted...        The patient complains of dyspnea on exertion.  The patient denies anorexia, fever, weight loss, weight gain, vision loss, decreased hearing, hoarseness, chest pain, syncope,  peripheral edema, prolonged cough, headaches, hemoptysis, abdominal pain, melena, hematochezia, severe indigestion/heartburn, hematuria, incontinence, muscle weakness, suspicious skin lesions, transient blindness, difficulty walking, depression, unusual weight change, abnormal bleeding, enlarged lymph nodes, and angioedema.    Objective:   Physical Exam     WD, WN, 70 y/o WM in NAD...  GENERAL:  Alert & oriented; pleasant & cooperative... HEENT:  [place]/AT, EOM-wnl, PERRLA, EACs-clear, TMs-wnl, NOSE-clear, THROAT-clear & wnl. NECK:  Supple w/ fairROM; no JVD; s/p left CAE, +bruits; no thyromegaly or nodules palpated; no lymphadenopathy. CHEST:  Clear to P & A; without wheezes/ rales/ or rhonchi heard... HEART:  Regular Rhythm; gr 1/6 SEM without rubs or gallops detected... ABDOMEN:  Soft & nontender; normal bowel sounds; no organomegaly or masses palpated... EXT: without deformities, mild arthritic changes; no varicose veins/ +venous insuffic/ 1+ edema. NEURO:  CN's intact; no focal neuro deficits... DERM:  No lesions noted; no rash etc...   Assessment & Plan:   CEREBROVASC Disease>  Known severe cerebrovasc dis but stable on ASA/ Plavix w/o cerebral ischemic symptoms s/p lef CAE in 2000 & 2 vertebral art angioplasties in 2001 & 2002...  CHOL>  Back on Crestor 20mg  /d & FLP looks great> continue same...  DM>  He is not on much of a diet & we discussed this; labs showed A1c up to 11.2 & he needs Endocrine eval & Rx...  Overweight>  We discussed low carb, low fat wt reducing diet...  GI>  GERD controlled w/ Prilosec; Loose stool likely secondary to Metformin Rx & his miralax/ colase> aske to stop the laxatives & monitor stool on the Metformin...  Hx Renal Cell Ca in right side of horseshoe kidney> removed in 1994 & follwed regularly by DrPeterson...  DJD/ Hx Gout/ LBP>  He has been seeing chiropractor regularly regarding LBP & he is now requesting Neurosurg consult & further eval for his  pain (prev saw DrRoy on 2007)...  Anxiety>  He remains on Alpraz Prn.Marland KitchenMarland Kitchen

## 2010-09-30 NOTE — Patient Instructions (Signed)
Today we updated your med list in EPIC>    Continue your current meds the same for now...  Please return to our lab one morning this week for your follow up fasting blood work...    Please call the [facility] in a few days for your results...    Dial [id number] & when prompted enter your patient number followed by the # symbol...    Your patient number is:  [id number]#  Peter Nicholson, you need a better low carb/ no sweets diet w/ a goal of losing 15-20 lbs...    Keep up the good work w/ your physical activity & exercise program...  We will call the Neurosurgeons to try & get an appt for a back pain evaluation...  Call for any questions...  Let's plan a follow up appt in 6 months, sooner if needed for problems.Marland KitchenMarland Kitchen

## 2010-10-08 ENCOUNTER — Other Ambulatory Visit (INDEPENDENT_AMBULATORY_CARE_PROVIDER_SITE_OTHER): Payer: Medicare Other

## 2010-10-08 DIAGNOSIS — K5730 Diverticulosis of large intestine without perforation or abscess without bleeding: Secondary | ICD-10-CM

## 2010-10-08 DIAGNOSIS — E119 Type 2 diabetes mellitus without complications: Secondary | ICD-10-CM

## 2010-10-08 DIAGNOSIS — I10 Essential (primary) hypertension: Secondary | ICD-10-CM

## 2010-10-08 DIAGNOSIS — E7800 Pure hypercholesterolemia, unspecified: Secondary | ICD-10-CM

## 2010-10-08 DIAGNOSIS — F411 Generalized anxiety disorder: Secondary | ICD-10-CM

## 2010-10-08 LAB — BASIC METABOLIC PANEL
CO2: 26 mEq/L (ref 19–32)
Chloride: 109 mEq/L (ref 96–112)
Creatinine, Ser: 1.1 mg/dL (ref 0.4–1.5)
Potassium: 4.6 mEq/L (ref 3.5–5.1)
Sodium: 143 mEq/L (ref 135–145)

## 2010-10-08 LAB — HEPATIC FUNCTION PANEL
ALT: 25 U/L (ref 0–53)
AST: 19 U/L (ref 0–37)
Alkaline Phosphatase: 70 U/L (ref 39–117)
Bilirubin, Direct: 0.1 mg/dL (ref 0.0–0.3)
Total Bilirubin: 0.5 mg/dL (ref 0.3–1.2)
Total Protein: 6.3 g/dL (ref 6.0–8.3)

## 2010-10-08 LAB — CBC WITH DIFFERENTIAL/PLATELET
Basophils Absolute: 0 10*3/uL (ref 0.0–0.1)
Eosinophils Absolute: 0.4 10*3/uL (ref 0.0–0.7)
Lymphocytes Relative: 22.8 % (ref 12.0–46.0)
MCHC: 33.9 g/dL (ref 30.0–36.0)
MCV: 98.2 fl (ref 78.0–100.0)
Monocytes Absolute: 0.5 10*3/uL (ref 0.1–1.0)
Neutrophils Relative %: 59.8 % (ref 43.0–77.0)
Platelets: 175 10*3/uL (ref 150.0–400.0)
RDW: 12.9 % (ref 11.5–14.6)

## 2010-10-08 LAB — LIPID PANEL
Total CHOL/HDL Ratio: 3
Triglycerides: 95 mg/dL (ref 0.0–149.0)

## 2010-10-08 LAB — HEMOGLOBIN A1C: Hgb A1c MFr Bld: 11.2 % — ABNORMAL HIGH (ref 4.6–6.5)

## 2010-10-09 ENCOUNTER — Other Ambulatory Visit: Payer: Self-pay | Admitting: *Deleted

## 2010-10-09 MED ORDER — SAXAGLIPTIN HCL 5 MG PO TABS: 5.0000 mg | ORAL_TABLET | Freq: Every day | ORAL | Status: DC

## 2010-10-09 MED ORDER — PIOGLITAZONE HCL 30 MG PO TABS: 30.0000 mg | ORAL_TABLET | Freq: Every day | ORAL | Status: DC

## 2010-10-09 MED ORDER — ROSUVASTATIN CALCIUM 20 MG PO TABS: 20.0000 mg | ORAL_TABLET | Freq: Every day | ORAL | Status: DC

## 2010-10-09 NOTE — Telephone Encounter (Signed)
Called and spoke with pt about his lab results per SN.  He will either need to start on onglyza 5mg  daily and actos 30mg  daily or start on levemier 12 units daily.  Pt chose to start on the actos and onglyza to see how he does on these meds for now and he is aware that his other labs were all good.  Pt requested that his crestor refills be sent in as well.  Pt will call back for appt in 3 months to recheck his progress on the med DM meds.

## 2010-10-11 ENCOUNTER — Encounter: Payer: Self-pay | Admitting: Pulmonary Disease

## 2010-11-18 ENCOUNTER — Other Ambulatory Visit: Payer: Self-pay | Admitting: Pulmonary Disease

## 2010-11-18 ENCOUNTER — Other Ambulatory Visit: Payer: Self-pay | Admitting: Neurological Surgery

## 2010-11-18 DIAGNOSIS — M545 Low back pain: Secondary | ICD-10-CM

## 2010-11-22 ENCOUNTER — Ambulatory Visit
Admission: RE | Admit: 2010-11-22 | Discharge: 2010-11-22 | Disposition: A | Discharge status: Home / Self Care | Payer: Medicare Other | Source: Ambulatory Visit | Attending: Neurological Surgery | Admitting: Neurological Surgery

## 2010-11-22 DIAGNOSIS — M545 Low back pain: Secondary | ICD-10-CM

## 2010-11-24 ENCOUNTER — Telehealth: Payer: Self-pay | Admitting: Pulmonary Disease

## 2010-11-24 NOTE — Telephone Encounter (Signed)
Called and spoke with Bjorn Loser from [facility].  Bjorn Loser states she received rx for pt's Glyburide for only # 60 tabs.  This would only be a 15 day supply as pt takes 2 tabs bid.  Therefore, ok'd Rhonda to change rx to a 1 month supply x 1 refill.

## 2010-11-26 ENCOUNTER — Other Ambulatory Visit: Payer: Self-pay | Admitting: *Deleted

## 2010-11-26 MED ORDER — GLYBURIDE 5 MG PO TABS: ORAL_TABLET | ORAL | Status: DC

## 2011-01-18 ENCOUNTER — Other Ambulatory Visit: Payer: Self-pay | Admitting: Pulmonary Disease

## 2011-02-08 ENCOUNTER — Other Ambulatory Visit: Payer: Self-pay | Admitting: Pulmonary Disease

## 2011-02-26 ENCOUNTER — Other Ambulatory Visit: Payer: Self-pay | Admitting: Pulmonary Disease

## 2011-03-09 ENCOUNTER — Encounter (HOSPITAL_COMMUNITY): Payer: Self-pay | Admitting: *Deleted

## 2011-03-09 ENCOUNTER — Emergency Department (HOSPITAL_COMMUNITY): Payer: Medicare Other

## 2011-03-09 ENCOUNTER — Inpatient Hospital Stay (HOSPITAL_COMMUNITY)
Admission: EM | Admit: 2011-03-09 | Discharge: 2011-03-27 | DRG: 493 | Disposition: A | Discharge status: Skilled Nursing Facility | Payer: Medicare Other | Attending: Orthopedic Surgery | Admitting: Orthopedic Surgery

## 2011-03-09 DIAGNOSIS — E785 Hyperlipidemia, unspecified: Secondary | ICD-10-CM | POA: Diagnosis present

## 2011-03-09 DIAGNOSIS — IMO0001 Reserved for inherently not codable concepts without codable children: Secondary | ICD-10-CM | POA: Diagnosis present

## 2011-03-09 DIAGNOSIS — G459 Transient cerebral ischemic attack, unspecified: Secondary | ICD-10-CM

## 2011-03-09 DIAGNOSIS — E7800 Pure hypercholesterolemia, unspecified: Secondary | ICD-10-CM | POA: Insufficient documentation

## 2011-03-09 DIAGNOSIS — I739 Peripheral vascular disease, unspecified: Secondary | ICD-10-CM | POA: Diagnosis present

## 2011-03-09 DIAGNOSIS — Y93H9 Activity, other involving exterior property and land maintenance, building and construction: Secondary | ICD-10-CM

## 2011-03-09 DIAGNOSIS — K219 Gastro-esophageal reflux disease without esophagitis: Secondary | ICD-10-CM | POA: Insufficient documentation

## 2011-03-09 DIAGNOSIS — F411 Generalized anxiety disorder: Secondary | ICD-10-CM | POA: Diagnosis present

## 2011-03-09 DIAGNOSIS — M1990 Unspecified osteoarthritis, unspecified site: Secondary | ICD-10-CM | POA: Diagnosis present

## 2011-03-09 DIAGNOSIS — W11XXXA Fall on and from ladder, initial encounter: Secondary | ICD-10-CM | POA: Diagnosis present

## 2011-03-09 DIAGNOSIS — F419 Anxiety disorder, unspecified: Secondary | ICD-10-CM | POA: Insufficient documentation

## 2011-03-09 DIAGNOSIS — Z8673 Personal history of transient ischemic attack (TIA), and cerebral infarction without residual deficits: Secondary | ICD-10-CM

## 2011-03-09 DIAGNOSIS — K560 Paralytic ileus: Secondary | ICD-10-CM | POA: Diagnosis not present

## 2011-03-09 DIAGNOSIS — I10 Essential (primary) hypertension: Secondary | ICD-10-CM | POA: Insufficient documentation

## 2011-03-09 DIAGNOSIS — Z85528 Personal history of other malignant neoplasm of kidney: Secondary | ICD-10-CM

## 2011-03-09 DIAGNOSIS — I872 Venous insufficiency (chronic) (peripheral): Secondary | ICD-10-CM | POA: Insufficient documentation

## 2011-03-09 DIAGNOSIS — I679 Cerebrovascular disease, unspecified: Secondary | ICD-10-CM | POA: Diagnosis present

## 2011-03-09 DIAGNOSIS — S82143A Displaced bicondylar fracture of unspecified tibia, initial encounter for closed fracture: Secondary | ICD-10-CM

## 2011-03-09 DIAGNOSIS — E876 Hypokalemia: Secondary | ICD-10-CM

## 2011-03-09 DIAGNOSIS — S82109A Unspecified fracture of upper end of unspecified tibia, initial encounter for closed fracture: Principal | ICD-10-CM | POA: Diagnosis present

## 2011-03-09 DIAGNOSIS — D62 Acute posthemorrhagic anemia: Secondary | ICD-10-CM | POA: Diagnosis not present

## 2011-03-09 DIAGNOSIS — K567 Ileus, unspecified: Secondary | ICD-10-CM

## 2011-03-09 DIAGNOSIS — G4733 Obstructive sleep apnea (adult) (pediatric): Secondary | ICD-10-CM | POA: Diagnosis present

## 2011-03-09 DIAGNOSIS — Y92009 Unspecified place in unspecified non-institutional (private) residence as the place of occurrence of the external cause: Secondary | ICD-10-CM

## 2011-03-09 HISTORY — DX: Cerebral infarction, unspecified: I63.9

## 2011-03-09 MED ORDER — FENTANYL CITRATE 0.05 MG/ML IJ SOLN
50.0000 ug | Freq: Once | INTRAMUSCULAR | Status: AC
  Administered 2011-03-09: 50 ug via INTRAVENOUS
  Filled 2011-03-09: qty 2

## 2011-03-09 NOTE — ED Notes (Signed)
C/o of rt knee pain since he fell earlier today he mis-stepped off a ladder and twisted the knee.  Painful now

## 2011-03-09 NOTE — ED Notes (Signed)
He has chronic pain in his rt hip and he gets shots in it.  That hip is severely hurting him also

## 2011-03-09 NOTE — ED Notes (Signed)
Judy:  pts wife.  [phone].  Call with an update with MD has seen pt.

## 2011-03-09 NOTE — ED Notes (Signed)
Reports falling from a ladder earlier in the day.  Reports having his (R) knee and leg twisted.  Denies LOC or hitting head.  Pulses 2+ in (R) foot.  Pt reports that he does not having much pain at this time.  Pants cut off, leg noted to be slightly swollen and deformity is noted.   No bruising noted.  Family at bedside.  Skin warm, dry and intact.  Neuro intact.

## 2011-03-10 ENCOUNTER — Encounter (HOSPITAL_COMMUNITY): Payer: Self-pay | Admitting: *Deleted

## 2011-03-10 ENCOUNTER — Inpatient Hospital Stay (HOSPITAL_COMMUNITY): Payer: Medicare Other

## 2011-03-10 ENCOUNTER — Other Ambulatory Visit: Payer: Self-pay

## 2011-03-10 ENCOUNTER — Encounter (HOSPITAL_COMMUNITY)
Admission: EM | Disposition: A | Discharge status: Skilled Nursing Facility | Payer: Self-pay | Source: Home / Self Care | Attending: Orthopedic Surgery

## 2011-03-10 ENCOUNTER — Inpatient Hospital Stay (HOSPITAL_COMMUNITY): Payer: Medicare Other | Admitting: Certified Registered"

## 2011-03-10 ENCOUNTER — Encounter (HOSPITAL_COMMUNITY): Payer: Self-pay | Admitting: Certified Registered"

## 2011-03-10 ENCOUNTER — Emergency Department (HOSPITAL_COMMUNITY): Payer: Medicare Other

## 2011-03-10 HISTORY — PX: EXTERNAL FIXATION LEG: SHX1549

## 2011-03-10 LAB — HEMOGLOBIN A1C
Hgb A1c MFr Bld: 7.2 % — ABNORMAL HIGH (ref <5.7)
Mean Plasma Glucose: 160 mg/dL — ABNORMAL HIGH (ref <117)

## 2011-03-10 LAB — CBC
Hemoglobin: 11.4 g/dL — ABNORMAL LOW (ref 13.0–17.0)
MCHC: 33 g/dL (ref 30.0–36.0)
RBC: 3.48 MIL/uL — ABNORMAL LOW (ref 4.22–5.81)
WBC: 9 10*3/uL (ref 4.0–10.5)

## 2011-03-10 LAB — GLUCOSE, CAPILLARY
Glucose-Capillary: 234 mg/dL — ABNORMAL HIGH (ref 70–99)
Glucose-Capillary: 157 mg/dL — ABNORMAL HIGH (ref 70–99)
Glucose-Capillary: 190 mg/dL — ABNORMAL HIGH (ref 70–99)

## 2011-03-10 LAB — BASIC METABOLIC PANEL
BUN: 22 mg/dL (ref 6–23)
CO2: 23 mEq/L (ref 19–32)
Chloride: 113 mEq/L — ABNORMAL HIGH (ref 96–112)
GFR calc Af Amer: 79 mL/min — ABNORMAL LOW (ref >90)
Potassium: 3.8 mEq/L (ref 3.5–5.1)

## 2011-03-10 LAB — DIFFERENTIAL
Basophils Relative: 0 % (ref 0–1)
Lymphocytes Relative: 11 % — ABNORMAL LOW (ref 12–46)
Lymphs Abs: 1 10*3/uL (ref 0.7–4.0)
Monocytes Relative: 8 % (ref 3–12)
Neutro Abs: 6.9 10*3/uL (ref 1.7–7.7)
Neutrophils Relative %: 77 % (ref 43–77)

## 2011-03-10 LAB — RENAL FUNCTION PANEL
BUN: 16 mg/dL (ref 6–23)
Chloride: 109 mEq/L (ref 96–112)
Glucose, Bld: 233 mg/dL — ABNORMAL HIGH (ref 70–99)
Potassium: 3.9 mEq/L (ref 3.5–5.1)

## 2011-03-10 LAB — TYPE AND SCREEN
ABO/RH(D): A POS
Antibody Screen: NEGATIVE

## 2011-03-10 SURGERY — EXTERNAL FIXATION, LOWER EXTREMITY
Anesthesia: General | Site: Knee | Laterality: Right | Wound class: Clean

## 2011-03-10 MED ORDER — INSULIN ASPART 100 UNIT/ML ~~LOC~~ SOLN
0.0000 [IU] | Freq: Three times a day (TID) | SUBCUTANEOUS | Status: DC
  Administered 2011-03-11 – 2011-03-14 (×6): 2 [IU] via SUBCUTANEOUS
  Filled 2011-03-10: qty 3

## 2011-03-10 MED ORDER — OXYCODONE HCL 5 MG PO TABS
5.0000 mg | ORAL_TABLET | ORAL | Status: DC | PRN
  Administered 2011-03-10 – 2011-03-13 (×5): 10 mg via ORAL
  Filled 2011-03-10 (×5): qty 2

## 2011-03-10 MED ORDER — METFORMIN HCL 500 MG PO TABS
1000.0000 mg | ORAL_TABLET | Freq: Two times a day (BID) | ORAL | Status: DC
  Administered 2011-03-11 – 2011-03-14 (×7): 1000 mg via ORAL
  Filled 2011-03-10 (×9): qty 2

## 2011-03-10 MED ORDER — METOCLOPRAMIDE HCL 5 MG/ML IJ SOLN
INTRAMUSCULAR | Status: DC | PRN
  Administered 2011-03-10: 10 mg via INTRAVENOUS

## 2011-03-10 MED ORDER — ONDANSETRON HCL 4 MG/2ML IJ SOLN
INTRAMUSCULAR | Status: DC | PRN
  Administered 2011-03-10: 4 mg via INTRAVENOUS

## 2011-03-10 MED ORDER — DEXTROSE-NACL 5-0.45 % IV SOLN
INTRAVENOUS | Status: DC
  Administered 2011-03-10: 01:00:00 via INTRAVENOUS

## 2011-03-10 MED ORDER — FENTANYL CITRATE 0.05 MG/ML IJ SOLN
INTRAMUSCULAR | Status: DC | PRN
  Administered 2011-03-10 (×6): 50 ug via INTRAVENOUS
  Administered 2011-03-10: 100 ug via INTRAVENOUS
  Administered 2011-03-10 (×2): 50 ug via INTRAVENOUS

## 2011-03-10 MED ORDER — LINAGLIPTIN 5 MG PO TABS
5.0000 mg | ORAL_TABLET | Freq: Every day | ORAL | Status: DC
  Administered 2011-03-11 – 2011-03-14 (×4): 5 mg via ORAL
  Filled 2011-03-10 (×4): qty 1

## 2011-03-10 MED ORDER — MIDAZOLAM HCL 5 MG/5ML IJ SOLN
INTRAMUSCULAR | Status: DC | PRN
  Administered 2011-03-10: 1 mg via INTRAVENOUS

## 2011-03-10 MED ORDER — ENOXAPARIN SODIUM 30 MG/0.3ML ~~LOC~~ SOLN
30.0000 mg | SUBCUTANEOUS | Status: DC
  Administered 2011-03-11 – 2011-03-13 (×3): 30 mg via SUBCUTANEOUS
  Filled 2011-03-10 (×3): qty 0.3

## 2011-03-10 MED ORDER — OMEPRAZOLE MAGNESIUM 20 MG PO TBEC
20.0000 mg | DELAYED_RELEASE_TABLET | ORAL | Status: DC
  Administered 2011-03-11 – 2011-03-14 (×4): 20 mg via ORAL
  Filled 2011-03-10 (×5): qty 1

## 2011-03-10 MED ORDER — FENTANYL CITRATE 0.05 MG/ML IJ SOLN: 25.0000 ug | INTRAMUSCULAR | Status: DC | PRN

## 2011-03-10 MED ORDER — ALLOPURINOL 300 MG PO TABS
300.0000 mg | ORAL_TABLET | Freq: Every day | ORAL | Status: DC
  Administered 2011-03-11 – 2011-03-27 (×16): 300 mg via ORAL
  Filled 2011-03-10 (×17): qty 1

## 2011-03-10 MED ORDER — ONDANSETRON HCL 4 MG/2ML IJ SOLN: 4.0000 mg | Freq: Three times a day (TID) | INTRAMUSCULAR | Status: AC | PRN

## 2011-03-10 MED ORDER — ONDANSETRON HCL 4 MG/2ML IJ SOLN: 4.0000 mg | Freq: Once | INTRAMUSCULAR | Status: DC | PRN

## 2011-03-10 MED ORDER — METHOCARBAMOL 500 MG PO TABS
500.0000 mg | ORAL_TABLET | Freq: Four times a day (QID) | ORAL | Status: DC | PRN
  Administered 2011-03-10 – 2011-03-18 (×6): 500 mg via ORAL
  Administered 2011-03-25 – 2011-03-26 (×3): 1000 mg via ORAL
  Filled 2011-03-10 (×2): qty 1
  Filled 2011-03-10 (×2): qty 2
  Filled 2011-03-10: qty 1
  Filled 2011-03-10: qty 2
  Filled 2011-03-10 (×2): qty 1

## 2011-03-10 MED ORDER — PIOGLITAZONE HCL 30 MG PO TABS
30.0000 mg | ORAL_TABLET | Freq: Every day | ORAL | Status: DC
  Administered 2011-03-11 – 2011-03-14 (×4): 30 mg via ORAL
  Filled 2011-03-10 (×4): qty 1

## 2011-03-10 MED ORDER — METOPROLOL TARTRATE 1 MG/ML IV SOLN
INTRAVENOUS | Status: DC | PRN
  Administered 2011-03-10 (×4): 1 mg via INTRAVENOUS

## 2011-03-10 MED ORDER — GLYBURIDE 5 MG PO TABS
10.0000 mg | ORAL_TABLET | Freq: Two times a day (BID) | ORAL | Status: DC
  Administered 2011-03-11 – 2011-03-14 (×7): 10 mg via ORAL
  Filled 2011-03-10 (×9): qty 2

## 2011-03-10 MED ORDER — POLYETHYLENE GLYCOL 3350 17 GM/SCOOP PO POWD
17.0000 g | Freq: Two times a day (BID) | ORAL | Status: DC
  Filled 2011-03-10: qty 255

## 2011-03-10 MED ORDER — MORPHINE SULFATE 2 MG/ML IJ SOLN
1.0000 mg | INTRAMUSCULAR | Status: DC | PRN
  Administered 2011-03-10 – 2011-03-14 (×5): 2 mg via INTRAVENOUS
  Filled 2011-03-10 (×5): qty 1

## 2011-03-10 MED ORDER — INSULIN ASPART 100 UNIT/ML ~~LOC~~ SOLN
4.0000 [IU] | Freq: Three times a day (TID) | SUBCUTANEOUS | Status: DC
  Administered 2011-03-12 – 2011-03-14 (×3): 4 [IU] via SUBCUTANEOUS

## 2011-03-10 MED ORDER — HYDROMORPHONE HCL PF 2 MG/ML IJ SOLN
2.0000 mg | Freq: Once | INTRAMUSCULAR | Status: AC
  Administered 2011-03-10: 2 mg via INTRAVENOUS
  Filled 2011-03-10: qty 1

## 2011-03-10 MED ORDER — ONE-DAILY MULTI VITAMINS PO TABS: 1.0000 | ORAL_TABLET | Freq: Every day | ORAL | Status: DC

## 2011-03-10 MED ORDER — SODIUM CHLORIDE 0.9 % IV SOLN: INTRAVENOUS | Status: DC

## 2011-03-10 MED ORDER — PROPOFOL 10 MG/ML IV EMUL
INTRAVENOUS | Status: DC | PRN
  Administered 2011-03-10: 160 mg via INTRAVENOUS

## 2011-03-10 MED ORDER — HYDROMORPHONE HCL PF 1 MG/ML IJ SOLN
1.0000 mg | INTRAMUSCULAR | Status: DC | PRN
  Administered 2011-03-10: 1 mg via INTRAVENOUS
  Filled 2011-03-10: qty 1

## 2011-03-10 MED ORDER — LACTATED RINGERS IV SOLN
INTRAVENOUS | Status: DC | PRN
  Administered 2011-03-10 (×3): via INTRAVENOUS

## 2011-03-10 MED ORDER — METHOCARBAMOL 100 MG/ML IJ SOLN
500.0000 mg | Freq: Four times a day (QID) | INTRAMUSCULAR | Status: DC | PRN
  Filled 2011-03-10 (×3): qty 10

## 2011-03-10 MED ORDER — INSULIN ASPART 100 UNIT/ML ~~LOC~~ SOLN: 0.0000 [IU] | Freq: Every day | SUBCUTANEOUS | Status: DC

## 2011-03-10 MED ORDER — POLYETHYLENE GLYCOL 3350 17 G PO PACK
17.0000 g | PACK | Freq: Two times a day (BID) | ORAL | Status: DC
  Administered 2011-03-10 – 2011-03-25 (×21): 17 g via ORAL
  Filled 2011-03-10 (×34): qty 1

## 2011-03-10 MED ORDER — POTASSIUM CHLORIDE IN NACL 20-0.9 MEQ/L-% IV SOLN
INTRAVENOUS | Status: DC
  Administered 2011-03-10: 10:00:00 via INTRAVENOUS
  Administered 2011-03-12 – 2011-03-13 (×3): 1000 mL via INTRAVENOUS
  Filled 2011-03-10 (×10): qty 1000

## 2011-03-10 MED ORDER — ROSUVASTATIN CALCIUM 20 MG PO TABS
20.0000 mg | ORAL_TABLET | Freq: Every day | ORAL | Status: DC
  Administered 2011-03-10 – 2011-03-14 (×4): 20 mg via ORAL
  Filled 2011-03-10 (×5): qty 1

## 2011-03-10 MED ORDER — THERA M PLUS PO TABS
1.0000 | ORAL_TABLET | Freq: Every day | ORAL | Status: DC
  Administered 2011-03-11 – 2011-03-27 (×16): 1 via ORAL
  Filled 2011-03-10 (×17): qty 1

## 2011-03-10 MED ORDER — ACETAMINOPHEN 10 MG/ML IV SOLN
1000.0000 mg | Freq: Four times a day (QID) | INTRAVENOUS | Status: DC
  Administered 2011-03-10 – 2011-03-11 (×3): 1000 mg via INTRAVENOUS
  Filled 2011-03-10 (×4): qty 100

## 2011-03-10 MED ORDER — LACTATED RINGERS IV SOLN
INTRAVENOUS | Status: DC
  Administered 2011-03-10: 14:00:00 via INTRAVENOUS

## 2011-03-10 MED ORDER — CEFAZOLIN SODIUM 1-5 GM-% IV SOLN
2.0000 g | Freq: Once | INTRAVENOUS | Status: AC
  Administered 2011-03-10: 2 g via INTRAVENOUS
  Filled 2011-03-10: qty 100

## 2011-03-10 MED ORDER — ALPRAZOLAM 0.25 MG PO TABS
0.2500 mg | ORAL_TABLET | Freq: Three times a day (TID) | ORAL | Status: DC | PRN
  Administered 2011-03-11: 0.25 mg via ORAL
  Administered 2011-03-19: 0.5 mg via ORAL
  Administered 2011-03-23: 0.25 mg via ORAL
  Filled 2011-03-10: qty 2
  Filled 2011-03-10 (×2): qty 1

## 2011-03-10 SURGICAL SUPPLY — 70 items
BANDAGE ACE 4 STERILE (GAUZE/BANDAGES/DRESSINGS) ×2 IMPLANT
BANDAGE ELASTIC 4 VELCRO ST LF (GAUZE/BANDAGES/DRESSINGS) ×2 IMPLANT
BANDAGE ELASTIC 6 VELCRO ST LF (GAUZE/BANDAGES/DRESSINGS) ×2 IMPLANT
BANDAGE ESMARK 6X9 LF (GAUZE/BANDAGES/DRESSINGS) ×1 IMPLANT
BANDAGE GAUZE ELAST BULKY 4 IN (GAUZE/BANDAGES/DRESSINGS) ×4 IMPLANT
BAR EXFX 150X11 NS LF (EXFIX) ×1
BAR EXFX 500X11 NS LF (MISCELLANEOUS) ×1
BAR GLASS FIBER EXFX 11X150 (EXFIX) ×2 IMPLANT
BAR GLASS FIBER EXFX 11X400 (EXFIX) ×2 IMPLANT
BAR GLASS FIBER EXFX 11X500 (MISCELLANEOUS) ×2 IMPLANT
BNDG COHESIVE 6X5 TAN STRL LF (GAUZE/BANDAGES/DRESSINGS) ×2 IMPLANT
BNDG ESMARK 6X9 LF (GAUZE/BANDAGES/DRESSINGS) ×2
BRUSH SCRUB DISP (MISCELLANEOUS) ×4 IMPLANT
CLAMP BAR TO BAR (Clamp) ×10 IMPLANT
CLAMP PIN 105MM (Clamp) ×2 IMPLANT
CLAMP PIN 45MM (Clamp) ×2 IMPLANT
CLEANER TIP ELECTROSURG 2X2 (MISCELLANEOUS) ×2 IMPLANT
CLOTH BEACON ORANGE TIMEOUT ST (SAFETY) ×2 IMPLANT
COVER SURGICAL LIGHT HANDLE (MISCELLANEOUS) ×4 IMPLANT
CUFF TOURNIQUET SINGLE 18IN (TOURNIQUET CUFF) IMPLANT
CUFF TOURNIQUET SINGLE 24IN (TOURNIQUET CUFF) IMPLANT
CUFF TOURNIQUET SINGLE 34IN LL (TOURNIQUET CUFF) IMPLANT
DRAPE C-ARM 42X72 X-RAY (DRAPES) ×4 IMPLANT
DRAPE ORTHO SPLIT 77X108 STRL (DRAPES) ×1
DRAPE SURG ORHT 6 SPLT 77X108 (DRAPES) ×1 IMPLANT
DRAPE U-SHAPE 47X51 STRL (DRAPES) ×2 IMPLANT
DRSG ADAPTIC 3X8 NADH LF (GAUZE/BANDAGES/DRESSINGS) ×2 IMPLANT
ELECT REM PT RETURN 9FT ADLT (ELECTROSURGICAL) ×2
ELECTRODE REM PT RTRN 9FT ADLT (ELECTROSURGICAL) ×1 IMPLANT
EVACUATOR 1/8 PVC DRAIN (DRAIN) IMPLANT
GAUZE KERLIX 2  STERILE LF (GAUZE/BANDAGES/DRESSINGS) ×2 IMPLANT
GLOVE BIO SURGEON STRL SZ7.5 (GLOVE) ×2 IMPLANT
GLOVE BIO SURGEON STRL SZ8 (GLOVE) ×6 IMPLANT
GLOVE BIOGEL PI IND STRL 7.5 (GLOVE) ×1 IMPLANT
GLOVE BIOGEL PI IND STRL 8 (GLOVE) ×1 IMPLANT
GLOVE BIOGEL PI INDICATOR 7.5 (GLOVE) ×1
GLOVE BIOGEL PI INDICATOR 8 (GLOVE) ×1
GOWN PREVENTION PLUS XLARGE (GOWN DISPOSABLE) ×2 IMPLANT
GOWN STRL NON-REIN LRG LVL3 (GOWN DISPOSABLE) ×4 IMPLANT
HANDPIECE INTERPULSE COAX TIP (DISPOSABLE)
KIT BASIN OR (CUSTOM PROCEDURE TRAY) ×2 IMPLANT
KIT ROOM TURNOVER OR (KITS) ×2 IMPLANT
MANIFOLD NEPTUNE II (INSTRUMENTS) IMPLANT
NEEDLE 22X1 1/2 (OR ONLY) (NEEDLE) IMPLANT
NS IRRIG 1000ML POUR BTL (IV SOLUTION) ×2 IMPLANT
PACK ORTHO EXTREMITY (CUSTOM PROCEDURE TRAY) ×2 IMPLANT
PAD ARMBOARD 7.5X6 YLW CONV (MISCELLANEOUS) ×2 IMPLANT
PADDING CAST COTTON 6X4 STRL (CAST SUPPLIES) ×6 IMPLANT
SET HNDPC FAN SPRY TIP SCT (DISPOSABLE) IMPLANT
SPONGE GAUZE 4X4 12PLY (GAUZE/BANDAGES/DRESSINGS) ×2 IMPLANT
SPONGE LAP 18X18 X RAY DECT (DISPOSABLE) ×2 IMPLANT
SPONGE SCRUB IODOPHOR (GAUZE/BANDAGES/DRESSINGS) ×2 IMPLANT
STAPLER VISISTAT 35W (STAPLE) IMPLANT
STOCKINETTE IMPERVIOUS LG (DRAPES) ×2 IMPLANT
STRIP CLOSURE SKIN 1/2X4 (GAUZE/BANDAGES/DRESSINGS) IMPLANT
SUCTION FRAZIER TIP 10 FR DISP (SUCTIONS) IMPLANT
SUT ETHILON 3 0 PS 1 (SUTURE) IMPLANT
SUT VIC AB 0 CT1 27 (SUTURE) ×4
SUT VIC AB 0 CT1 27XBRD ANBCTR (SUTURE) ×2 IMPLANT
SUT VIC AB 2-0 CT1 27 (SUTURE) ×4
SUT VIC AB 2-0 CT1 TAPERPNT 27 (SUTURE) ×2 IMPLANT
SYR CONTROL 10ML LL (SYRINGE) IMPLANT
TOWEL OR 17X24 6PK STRL BLUE (TOWEL DISPOSABLE) ×4 IMPLANT
TOWEL OR 17X26 10 PK STRL BLUE (TOWEL DISPOSABLE) ×4 IMPLANT
TUBE CONNECTING 12X1/4 (SUCTIONS) IMPLANT
UNDERPAD 30X30 INCONTINENT (UNDERPADS AND DIAPERS) ×2 IMPLANT
WATER STERILE IRR 1000ML POUR (IV SOLUTION) IMPLANT
YANKAUER SUCT BULB TIP NO VENT (SUCTIONS) IMPLANT
half pin 5mm x 160mm x 55mm ×8 IMPLANT
straight post, 11mm ×8 IMPLANT

## 2011-03-10 NOTE — ED Notes (Signed)
Pt. To CT via stretcher with tech

## 2011-03-10 NOTE — ED Notes (Signed)
Called pts family to update them on pts POC.

## 2011-03-10 NOTE — H&P (Signed)
Orthopaedic Trauma Service Chief Complaint:  Fall with right knee pain   HPI:  [name] is a very pleasant 70 year old Caucasian male with an extensive medical history most notable for nephrectomy back in 1994 for renal cell carcinoma, diabetes mellitus and hypertension who was cleaning his gutters yesterday evening. Patient was descending his ladder he missed the bottom 3 lungs, lost his footing and fell to the ground below. Patient landed on his right leg and hip. He states that his right knee buckled out from under him resulting in immediate onset of pain and inability to bear weight. Patient was brought to [facility] for evaluation where she was found to have a complex right tibial plateau fracture. Patient was admitted to the orthopaedic floor for observation and pain control. The orthopedic trauma service was asked to see the patient given the significant complexity of this injury. Orthopedist on call last night felt that this particular injury needed the particular skill set of an orthopedic traumatologist to give the patient the best opportunity for full recovery.  Currently [name] is in room [phone]. His pain does appear to be fairly well controlled. He is in a compressive wrap that starts midway up his lower leg extending to his knee. He is not in a knee immobilizer at the current time. Patient denies any chest pain, shortness of breath, nausea, vomiting, diarrhea. No recent illnesses. No headaches or lightheadedness are noted as well. Patient denies any new onset numbness or tingling in his right leg. He does have some pre-existing sciatic nerve symptoms and was asked to scheduled for an epidural injection this Wednesday which will have to wait given her current condition. Patient's pain is well controlled as long as he is stable and not moving too much. He has increased pain with movement and shifting of his leg. Pain has improved since he has been admitted to the hospital and  described as a headache and occasional sharp pain in his right knee. Again pain medicine and rest make his pain better and tolerable, movement exacerbates his pain.  Denies any pain elsewhere but does complain of some aching in his right ankle. His hips are nonpainful as well.  Of note the patient was on Plavix for history of carotid endarterectomy up until 8 days ago as he was planning to have an epidural steroid injection this Wednesday. After surgery we will restart his Lovenox and likely cover him with this until we can definitively fix this fracture. Given his history of nephrectomy we will need to calculate his creatinine clearance to ensure that he has good renal function and can tolerate Lovenox.   Past Medical History  Diagnosis Date  . Obstructive sleep apnea (adult) (pediatric)   . Unspecified essential hypertension   . Cerebrovascular disease, unspecified   . Unspecified venous (peripheral) insufficiency   . Pure hypercholesterolemia   . Type II or unspecified type diabetes mellitus without mention of complication, not stated as uncontrolled   . Overweight   . Diaphragmatic hernia without mention of obstruction or gangrene   . Esophageal reflux   . Diverticulosis of colon (without mention of hemorrhage)   . Other specified congenital anomaly of kidney   . Malignant neoplasm of kidney, except pelvis   . Calculus of kidney   . Osteoarthrosis, unspecified whether generalized or localized, unspecified site   . Gout, unspecified   . Lumbago   . Anxiety state, unspecified     Past Surgical History  Procedure Date  . Incisional  hernia repair 1/96    Dr.Leone  . Nasal sinus surgery 12/96    Dr.Redman  . Cholecystectomy 9/04    Dr. Maryagnes Amos  . Partial nephrectomy 1994    renal cell cancer in horseshoe kidney  . Carotid endarterectomy 09/2000    Dr.Lawson  . Veterbral art angioplasty     x2  . Small intestine surgery     Family History  Problem Relation Age of Onset  .  Heart attack Father   . Breast cancer     Social History:  reports that he has never smoked. He has never used smokeless tobacco. He reports that he does not drink alcohol or use illicit drugs. 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 Hydromorphone Hcl     REACTION: hallucinations  . Promethazine Hcl     REACTION: hallucinations    Medications Prior to Admission  Medication Dose Route Frequency Provider Last Rate Last Dose  . 0.9 % NaCl with KCl 20 mEq/ L  infusion   Intravenous Continuous Mearl Latin, PA      . acetaminophen (OFIRMEV) IVPB 1,000 mg  1,000 mg Intravenous Q6H Mearl Latin, PA      . enoxaparin (LOVENOX) injection 30 mg  30 mg Subcutaneous Q24H Mearl Latin, PA      . fentaNYL (SUBLIMAZE) injection 50 mcg  50 mcg Intravenous Once Nicholes Stairs, MD   50 mcg at 03/09/11 2258  . HYDROmorphone (DILAUDID) injection 2 mg  2 mg Intravenous Once Nicholes Stairs, MD   2 mg at 03/10/11 0047  . insulin aspart (novoLOG) injection 0-15 Units  0-15 Units Subcutaneous TID WC Mearl Latin, PA      . insulin aspart (novoLOG) injection 0-5 Units  0-5 Units Subcutaneous QHS Mearl Latin, PA      . insulin aspart (novoLOG) injection 4 Units  4 Units Subcutaneous TID WC Mearl Latin, PA      . methocarbamol (ROBAXIN) 1,000 mg in dextrose 5 % 50 mL IVPB  1,000 mg Intravenous Q6H PRN Mearl Latin, PA      . methocarbamol (ROBAXIN) tablet 500-1,000 mg  500-1,000 mg Oral Q6H PRN Mearl Latin, PA      . morphine 2 MG/ML injection 1-2 mg  1-2 mg Intravenous Q2H PRN Mearl Latin, PA      . ondansetron [facility]) injection 4 mg  4 mg Intravenous Q8H PRN Nicholes Stairs, MD      . oxyCODONE (Oxy IR/ROXICODONE) immediate release tablet 5-15 mg  5-15 mg Oral Q3H PRN Mearl Latin, PA      . DISCONTD: 0.9 %  sodium chloride infusion   Intravenous Continuous Nicholes Stairs, MD        . DISCONTD: dextrose 5 %-0.45 % sodium chloride infusion   Intravenous STAT Nicholes Stairs, MD 125 mL/hr at 03/10/11 0600    . DISCONTD: HYDROmorphone (DILAUDID) injection 1 mg  1 mg Intravenous Q4H PRN Nicholes Stairs, MD   1 mg at 03/10/11 4098   Medications Prior to Admission  Medication Sig Dispense Refill  . allopurinol (ZYLOPRIM) 300 MG tablet TAKE ONE TABLET BY MOUTH EVERY DAY  30 tablet  5  . ALPRAZolam (XANAX) 0.5 MG tablet Take 1/2 to 1 tablet by mouth three times daily as needed for nerves  90 tablet  0  .  aspirin 325 MG EC tablet Take 325 mg by mouth daily.        Marland Kitchen docusate sodium (COLACE) 100 MG capsule Take 100 mg by mouth daily.        Marland Kitchen glyBURIDE (DIABETA) 5 MG tablet Take 2 tablets by mouth two times daily  360 tablet  3  . metFORMIN (GLUCOPHAGE) 500 MG tablet TAKE TWO TABLETS BY MOUTH TWICE DAILY  360 tablet  3  . Multiple Vitamin (MULTIVITAMIN) tablet Take 1 tablet by mouth daily.        Marland Kitchen omeprazole (PRILOSEC OTC) 20 MG tablet Take 20 mg by mouth daily.        . pioglitazone (ACTOS) 30 MG tablet Take 1 tablet (30 mg total) by mouth daily.  30 tablet  11  . PLAVIX 75 MG tablet TAKE ONE TABLET BY MOUTH EVERY DAY  90 each  0  . polyethylene glycol powder (MIRALAX) powder Take 17 g by mouth 2 (two) times daily. Take one scoop in 8 ounces of water two times a day as needed.       . rosuvastatin (CRESTOR) 20 MG tablet Take 1 tablet (20 mg total) by mouth at bedtime.  30 tablet  11  . saxagliptin HCl (ONGLYZA) 5 MG TABS tablet Take 1 tablet (5 mg total) by mouth daily.  30 tablet  11    Results for orders placed during the hospital encounter of 03/09/11 (from the past 48 hour(s))  CBC     Status: Abnormal   Collection Time   03/09/11 11:04 PM      Component Value Range Comment   WBC 9.0  4.0 - 10.5 (K/uL)    RBC 3.48 (*) 4.22 - 5.81 (MIL/uL)    Hemoglobin 11.4 (*) 13.0 - 17.0 (g/dL)    HCT 16.1 (*) 09.6 - 52.0 (%)    MCV 99.1  78.0 - 100.0 (fL)    MCH 32.8  26.0  - 34.0 (pg)    MCHC 33.0  30.0 - 36.0 (g/dL)    RDW 04.5  40.9 - 81.1 (%)    Platelets 173  150 - 400 (K/uL)   DIFFERENTIAL     Status: Abnormal   Collection Time   03/09/11 11:04 PM      Component Value Range Comment   Neutrophils Relative 77  43 - 77 (%)    Neutro Abs 6.9  1.7 - 7.7 (K/uL)    Lymphocytes Relative 11 (*) 12 - 46 (%)    Lymphs Abs 1.0  0.7 - 4.0 (K/uL)    Monocytes Relative 8  3 - 12 (%)    Monocytes Absolute 0.7  0.1 - 1.0 (K/uL)    Eosinophils Relative 3  0 - 5 (%)    Eosinophils Absolute 0.3  0.0 - 0.7 (K/uL)    Basophils Relative 0  0 - 1 (%)    Basophils Absolute 0.0  0.0 - 0.1 (K/uL)   BASIC METABOLIC PANEL     Status: Abnormal   Collection Time   03/09/11 11:04 PM      Component Value Range Comment   Sodium 144  135 - 145 (mEq/L)    Potassium 3.8  3.5 - 5.1 (mEq/L)    Chloride 113 (*) 96 - 112 (mEq/L)    CO2 23  19 - 32 (mEq/L)    Glucose, Bld 134 (*) 70 - 99 (mg/dL)    BUN 22  6 - 23 (mg/dL)    Creatinine, Ser 9.14  0.50 -  1.35 (mg/dL)    Calcium 9.1  8.4 - 10.5 (mg/dL)    GFR calc non Af Amer 68 (*) >90 (mL/min)    GFR calc Af Amer 79 (*) >90 (mL/min)   GLUCOSE, CAPILLARY     Status: Abnormal   Collection Time   03/10/11  2:51 AM      Component Value Range Comment   Glucose-Capillary 189 (*) 70 - 99 (mg/dL)    Comment 1 Notify RN      Comment 2 Documented in Chart     GLUCOSE, CAPILLARY     Status: Abnormal   Collection Time   03/10/11  6:46 AM      Component Value Range Comment   Glucose-Capillary 234 (*) 70 - 99 (mg/dL)    Comment 1 Notify RN      Dg Chest 1 View  03/10/2011  *RADIOLOGY REPORT*  Clinical Data: Preoperative respiratory exam for right tibial fracture.  CHEST - 1 VIEW  Comparison: 08/06/2008  Findings: Some degree of underlying chronic lung disease is suspected.  No evidence of overt edema or infiltrate.  Heart is mildly enlarged.  The aorta is tortuous.  No pleural effusions.  IMPRESSION: Chronic lung disease and mild  cardiomegaly.  Original Report Authenticated By: Reola Calkins, [name]   Ct Knee Right Wo Contrast  03/10/2011  *RADIOLOGY REPORT*  Clinical Data: Status post fall from roof, with injury to the right knee.  Comminuted tibial fracture noted on radiograph.  Request further evaluation on CT.  CT OF THE RIGHT KNEE WITHOUT CONTRAST  Technique:  Multidetector CT imaging was performed according to the standard protocol. Multiplanar CT image reconstructions were also generated.  Comparison: Right knee radiographs performed 03/09/2011  Findings: There is a significantly comminuted fracture through the proximal tibia, compatible with a complex Schatzker type V fracture, or a type C3 fracture in the OTA classification.  Two fracture lines are seen extending to the lateral tibial plateau, and there is significant comminution along the medial tibial plateau.  There is an oblique fracture line extending across the tibial spine, with mild displacement.  Fracture fragments are posteriorly displaced and slightly angulated.  There is fragmentation at the level of the tibiofibular articulation.  A nearly horizontal metaphyseal fracture line is noted distally, without definite extension into the diaphysis.  The proximal fibula remains intact.  The distal femur also appears intact.  A moderate lipohemarthrosis is noted at the knee joint.  The medial and lateral menisci are not well characterized; there is mild lateral compartment widening.  The posterior cruciate ligament remains grossly intact; the anterior cruciate ligament is difficult to fully assess.  The medial collateral ligament is unremarkable in appearance.  There is disruption at the distal insertions of portions of the lateral collateral ligament complex.  The patellar tendon and quadriceps tendons are within normal limits.  Mild prepatellar soft tissue swelling is noted.  There is no evidence of significant disruption of the underlying vasculature.  Scattered soft  tissue edema is noted about the fracture site.  IMPRESSION:  1.  Significantly comminuted fracture through the proximal tibia, compatible with a complex Schatzker type V fracture, or a type C3 fracture in the OTA classification.  Nearly horizontal metaphyseal fracture line noted distally, without definite extension to the diaphysis. 2.  Moderate lipohemarthrosis at the knee joint. 3.  Anterior cruciate ligament not well assessed; disruption at the distal insertions of portions of the lateral collateral ligament complex.  Original Report Authenticated By: Tonia Ghent, [name]  Dg Knee Complete 4 Views Right  03/09/2011  *RADIOLOGY REPORT*  Clinical Data: Fall from ladder with right knee injury.  RIGHT KNEE - COMPLETE 4+ VIEW  Comparison: None.  Findings: Comminuted fracture of the proximal tibia present demonstrating multiple planes and mild displacement.  Fracture plane does extend up into the central joint at the level of the tibial spines.  The distal femur and proximal fibula appear intact. Cross-table lateral view does show a lipohemarthrosis with elongated fluid level present.  IMPRESSION: Comminuted proximal tibial fracture with involvement of the central tibial plateau.  Associated lipohemarthrosis.  Original Report Authenticated By: Reola Calkins, [name]    Review of Systems  Constitutional: Negative for fever, chills and diaphoresis.  HENT: Negative for sore throat.   Eyes: Negative for blurred vision and pain.  Respiratory: Negative for cough, hemoptysis, shortness of breath and wheezing.   Cardiovascular: Positive for leg swelling (pt notes L >R). Negative for chest pain and palpitations.  Gastrointestinal: Negative for heartburn, nausea, vomiting, abdominal pain and diarrhea.  Genitourinary: Negative for dysuria, urgency, hematuria and flank pain.  Musculoskeletal: Positive for back pain and joint pain.  Skin: Negative.   Neurological: Negative for dizziness, tingling, sensory change,  focal weakness, weakness and headaches.  Endo/Heme/Allergies:       Pt was on plavix up until 8 days ago.  He was getting ready for epidural steroid injection On plavix due to L carotid artery endarterectomy   Psychiatric/Behavioral: Negative for depression.    Blood pressure 150/75, pulse 101, temperature 98.4 F (36.9 C), temperature source Oral, resp. rate 18, SpO2 99.00%. Physical Exam  Constitutional: He is oriented to person, place, and time. Vital signs are normal. He appears well-developed and well-nourished. He is cooperative. He does not appear ill. No distress.  HENT:  Head: Normocephalic and atraumatic.  Mouth/Throat: Oropharynx is clear and moist and mucous membranes are normal.  Eyes: Conjunctivae and EOM are normal.  Neck: Normal range of motion and full passive range of motion without pain. Neck supple. No spinous process tenderness and no muscular tenderness present. Carotid bruit is not present.  Cardiovascular: Normal rate, regular rhythm, S1 normal, S2 normal and normal heart sounds.   No murmur heard. Pulses:      Dorsalis pedis pulses are 2+ on the right side, and 2+ on the left side.  Respiratory: Effort normal and breath sounds normal. No respiratory distress. He has no wheezes. He has no rales. He exhibits no tenderness.  GI: Soft. He exhibits no distension. There is no tenderness.       Surgical scars noted, obese + Bowel sounds  Musculoskeletal:       Bilateral upper extremities        Without any acute findings        Shoulders, humeri, elbows, forearms, wrists, hands demonstrate full range of motion, symmetric strength, intact sensory functions, symmetric radial pulses        No open wounds or lesions are noted        No asymmetric swelling is appreciated        Extremities are warm  Left lower extremity      Hip, knee, ankle, foot are without pain with palpation. Patient demonstrates active range of motion in all the joints without significant difficulty  or pain.      Patient does have significant swelling to his left ankle and lower leg which he states is normal.      Deep peroneal nerve, superficial peroneal nerve,  tibial nerve, femoral nerve sensory functions are intact      EHL, FHL, anterior tibialis, posterior tibialis, and gastrocsoleus complex, quadriceps, hamstrings motor function are well      No deep calf tenderness, compartments are soft and nontender, no pain with passive stretching.      Palpable dorsalis pedis pulses noted      Extremities warm      Patient has good color to his skin  Right lower extremity      Hip is without acute findings, no pain with logrolling or axial loading of the right hip.      His ankle does demonstrate some swelling at the lateral and medial malleolus, no significant ecchymosis is appreciated but there is some tenderness to palpation along the medial aspect of the right ankle.      Foot is nontender to palpation      Patient demonstrates good range of motion in his toes and ankle without pain.     Active knee range of motion not performed secondary to tibial plateau fracture     Right knee is notable for moderate joint effusion and probable hemarthrosis.     patient has severe pain with palpation over the proximal tibia and fibula     There is also extensive swelling to the proximal lower leg.      Minimal wrinkling of the skin medially and laterally is noted along anticipated area of surgical approach for repair of bicondylar tibial plateau fracture.      Patient also has extensive swelling extending distally to the right ankle as well, this may be baseline for the patient given swelling to the left lower extremity.      Compartments are supple and the patient does not have pain out of proportion with passive stretching of the anterior, lateral, superficial posterior, deep posterior compartments      Palpable dorsalis pedis pulse is appreciated      Extremities warm  Neurological: He is alert and  oriented to person, place, and time.  Skin: Skin is warm and dry.       Again extensive swelling to the right knee proximal tibia is noted. Bilateral lower extremity swelling is also appreciated consistent with peripheral vascular disease  Psychiatric: He has a normal mood and affect. His behavior is normal.     Assessment/Plan  70 year old male status post fall with complex medical history and complex right tibial plateau fracture  1. Complex comminuted right proximal tibia fracture, Schatzker V (OTA: 41-C3)  Patient demonstrates a significant comminution of the joint surface. There is also shortening particularly along the posterior medial plateau. Patient will likely require temporary stabilization in an external fixator given this shortening as well as his soft tissue swelling.  The patient will likely go to the OR today for a spanning external fixation of his right leg. We are hopeful that through ligamentotaxis we will be able to restore some length to facilitate plate osteosynthesis within a week or 2.  Patient will continue to be nonweightbearing after external fixation and will be nonweightbearing on his right lower extremity for 8 weeks after open reduction and internal fixation.  I am concerned that given the patient's evidence of peripheral vascular disease that swelling resolution may be delayed several weeks.  We will continue with aggressive Peter Nicholson, elevation, compression to help facilitate swelling resolution and to prepare his soft tissues for surgical intervention.  Again patient's fracture pattern will require formal open reduction and internal fixation to minimize the  chances of development of posttraumatic arthritis. Over reduction and internal fixation will also help restore length, alignment, stability and will allow [place] to evaluate his menisci for any associated damage.  After formal ORIF we will evaluate his static stabilizers to evaluate for any damage to the cruciate or  collateral ligaments as well.  That given patient's history of renal cell carcinoma it may be prudent to send some of the bone down to pathology to evaluate for the possibility of any metastatic component.  2. Type 2 diabetes  Maintain strict control with blood sugars less than 200 mg/dL  Will start SSI  This also poses issues regarding soft tissue and bone healing both now and later on.  3. cerebrovascular disease   Appears to be stable at current time, will monitor and contact Drs as needed  4. GERD  Continue with prophylaxis 5. peripheral vascular disease/venous insufficiency  Again and is unclear as if the patient has a primary care doctor for whom he follows up with blood this particular condition impacts the ability to expeditiously resolved his soft tissue swelling with respect to his right lower extremity and can also impair his ability to heal his surgical wounds later on. We will need to keep a close eye on the patient for any type of wound healing problems. Fortunately the patient does not smoke of which would further hinder his overall healing ability.  6. renal issues   Patient's renal doctor was Dr. Shiela Mayer who has since retired. His last visit was about a year ago and needs to establish care with a new nephrologist.  I would like the pharmacist to calculate his creatinine clearance to ensure that his renal function is appropriate and so that we can use Lovenox for DVT/PE prophylaxis  7. Pain   IV morphine, IV Tylenol, PO oxycodone for breakthrough pain and Robaxin for muscle spasms 8. DVT PE prophylaxis  Lovenox after surgery if renal function appropriate  Mechanical pumps for now. 9. FEN  N.p.o. for now  Diabetic diet after surgery 10. Metabolic bone disease  Will start workup for secondary causes for decreased bone mineral density. Will check a vitamin D panel, intact PTH, renal panel, pre-albumin and ionized calcium.  If these labs come back normal we'll also check  TSH.  In addition we will also likely send some bone down to pathology given his history of renal cell carcinoma and the known propensity for bone metastases with renal carcinoma.  11. Disposition  Probable OR today for spanning external fixation right knee.  Once pain is controlled and patient mobilizing well will likely discharge to home or skilled nursing facility while we await soft tissue resolution to proceed with formal ORIF.  Mearl Latin, PA-C 03/10/2011, 8:54 AM

## 2011-03-10 NOTE — Progress Notes (Signed)
Orthopedic Tech Progress Note Patient Details:  Peter Nicholson [birth year] [id number]  Other Ortho Devices Type of Ortho Device: Knee Immobilizer Ortho Device Location: applied to right knee Ortho Device Interventions: Application   Jennye Moccasin 03/10/2011, 9:39 PM Applied overhead frame

## 2011-03-10 NOTE — Anesthesia Preprocedure Evaluation (Signed)
Anesthesia Evaluation  Patient identified by MRN, date of birth, ID band Patient awake    Reviewed: Allergy & Precautions, H&P , NPO status , Patient's Chart, lab work & pertinent test results  Airway Mallampati: II TM Distance: >3 FB     Dental  (+) Teeth Intact   Pulmonary  clear to auscultation        Cardiovascular Regular Normal    Neuro/Psych    GI/Hepatic   Endo/Other    Renal/GU      Musculoskeletal   Abdominal (+) obese,  Abdomen: soft.    Peds  Hematology   Anesthesia Other Findings   Reproductive/Obstetrics                           Anesthesia Physical Anesthesia Plan  ASA: III  Anesthesia Plan: General   Post-op Pain Management:    Induction: Intravenous  Airway Management Planned: LMA  Additional Equipment:   Intra-op Plan:   Post-operative Plan:   Informed Consent: I have reviewed the patients History and Physical, chart, labs and discussed the procedure including the risks, benefits and alternatives for the proposed anesthesia with the patient or authorized representative who has indicated his/her understanding and acceptance.   Dental advisory given  Plan Discussed with:   Anesthesia Plan Comments: (Comminuted R. Tibial plateau fx Type 2 DM cbg 157 S/p partial nephrectomy for Ca 1994 H/o cva H/o kidney stones  Plan GA with  LMA)        Anesthesia Quick Evaluation

## 2011-03-10 NOTE — Op Note (Signed)
NAMETANNAR, BROKER NO.:  [id number]  MEDICAL RECORD NO.:  [id number]  LOCATION:  5036                         FACILITY:  [facility]  PHYSICIAN:  Doralee Albino. Carola Frost, [name] DATE OF BIRTH:  Aug 24, 1940  DATE OF PROCEDURE:  03/10/2011 DATE OF DISCHARGE:                              OPERATIVE REPORT   PREOPERATIVE DIAGNOSIS:  Right bicondylar tibial plateau fracture.  POSTOPERATIVE DIAGNOSIS:  Right bicondylar tibial plateau fracture.  PROCEDURE:  Spanning external fixation of right tibial plateau with closed reduction.  SURGEON:  Doralee Albino. Carola Frost, MD  ASSISTANT:  Mearl Latin, PA  ANESTHESIA:  General.  COMPLICATIONS:  None.  ESTIMATED BLOOD LOSS:  Scant.  DISPOSITION:  To PACU.  CONDITION:  Stable.  BRIEF SUMMARY OF INDICATION FOR PROCEDURE:  Denzell Colasanti is a very pleasant 70 year old male who fell 6 feet off a ladder while cleaning gutters sustaining an impacted bicondylar tibial plateau fracture.  We discussed with him the possibility of delayed internal fixation, acute internal fixation, or spanning external fixation with limited internal fixation versus staged procedure.  He understood the risks to include infection, nerve injury, vessel injury, DVT, PE, heart attack, stroke, worsening of his venous insufficiency and need for further surgery among others.  He furthermore understood our concern about the possibility of wound break down and infection, which could lead to limb loss.  As such, we agreed to proceed conservatively with limiting the downside risk.  BRIEF SUMMARY OF PROCEDURE:  Mr. Salmons was administered preoperative antibiotics, taken to the operating room where general anesthesia was induced.  His right lower extremity was then prepped and draped in usual sterile fashion using manual traction applied to the foot.  We then used a series of bumps and traction to reduce the fracture provisionally. This was followed by placement of 2 Schanz  pins in the tibial cortex, well distal to the anticipated incision, and 2 in the femur proximal to the reflection of the knee joint.  A medial jointed strut was then established to dial in the reduction.  This was secured provisionally by tightening the clamp on the medial side.  This was followed by placement of additional lateral long bar, and this was again secured provisionally and then definitively.  Final images, AP and lateral, showed appropriate restoration of length, excellent alignment of the articular surface, slight increase in posterior tilt but definitely significantly improved reduction at the articular surface where has there previously been coronal displacement.  Length was restored as the patient had been about 2 cm short by the CT scan.  Mearl Latin, PA-C assisted me throughout the procedure and was necessary to control the limb for reduction and to tighten the clamps into place as I produced and maintained the reduction.  The Schanz pins were then wrapped with dry Kerlix and an Ace wrap from foot to thigh.  The patient was awakened from anesthesia and transported to PACU in stable condition.  PROGNOSIS:  Mr. Foti will be nonweightbearing with mobilization as tolerated, elevation, ice and I anticipate return to the OR in 10-14 days for definitive repair.  We were quite concerned about the possibility of wound complications, worsening of his  venous stasis, and others secondary to his multiple medical comorbidities including diabetes and the venous insufficiency.  These factors increase his risk considerably.     Doralee Albino. Carola Frost, [name]     [facility]MED[facility]  D:  03/10/2011  T:  03/10/2011  Job:  [id number]

## 2011-03-10 NOTE — Brief Op Note (Signed)
03/09/2011 - 03/10/2011  5:48 PM  PATIENT:  Peter Nicholson  70 y.o. male  PRE-OPERATIVE DIAGNOSIS:  right tibial plateau fracture  POST-OPERATIVE DIAGNOSIS:  right tibial plateau fracture  PROCEDURE:  Procedure(s): EXTERNAL FIXATION LEG  SURGEON:  Surgeon(s): [employer]  PHYSICIAN ASSISTANT: Montez Morita, [facility]   ANESTHESIA:   general  EBL:  Total I/O In: 1000 [I.V.:1000] Out: -   BLOOD ADMINISTERED:none  DRAINS: none   LOCAL MEDICATIONS USED:  NONE  SPECIMEN:  No Specimen  DISPOSITION OF SPECIMEN:  N/A  COUNTS:  YES  TOURNIQUET:  * No tourniquets in log *  DICTATION: .Other Dictation: Dictation Number [id number]  PLAN OF CARE: Admit to inpatient   PATIENT DISPOSITION:  PACU - hemodynamically stable.

## 2011-03-10 NOTE — Transfer of Care (Signed)
Immediate Anesthesia Transfer of Care Note  Patient: Peter Nicholson  Procedure(s) Performed:  EXTERNAL FIXATION LEG  Patient Location: PACU  Anesthesia Type: General  Level of Consciousness: awake, alert  and oriented  Airway & Oxygen Therapy: Patient Spontanous Breathing and Patient connected to nasal cannula oxygen  Post-op Assessment: Report given to PACU RN, Post -op Vital signs reviewed and stable and Patient moving all extremities X 4  Post vital signs: Reviewed and stable  Complications: No apparent anesthesia complications

## 2011-03-10 NOTE — Anesthesia Procedure Notes (Addendum)
Procedure Name: LMA Insertion Date/Time: 03/10/2011 4:13 PM Performed by: Rossie Muskrat Pre-anesthesia Checklist: Patient identified, Timeout performed, Emergency Drugs available, Suction available and Patient being monitored Patient Re-evaluated:Patient Re-evaluated prior to inductionOxygen Delivery Method: Circle System Utilized Preoxygenation: Pre-oxygenation with 100% oxygen Intubation Type: IV induction Ventilation: Mask ventilation without difficulty LMA: LMA inserted LMA Size: 4.0 Number of attempts: 1 Tube secured with: Tape Dental Injury: Teeth and Oropharynx as per pre-operative assessment

## 2011-03-10 NOTE — Preoperative (Signed)
Beta Blockers   Reason not to administer Beta Blockers:Pt does not take B Blockers 

## 2011-03-10 NOTE — Progress Notes (Signed)
Orthopedic Tech Progress Note Patient Details:  Peter Nicholson Jun 28, 1940 [id number]  Other Ortho Devices Type of Ortho Device: Knee Immobilizer Ortho Device Location: applied to right knee Ortho Device Interventions: Application   Gaye Pollack 03/10/2011, 10:07 AM

## 2011-03-10 NOTE — Progress Notes (Signed)
Pt a.m. Blood sugar was 234. No sliding scale insulin ordered on admission.

## 2011-03-10 NOTE — ED Provider Notes (Addendum)
History     CSN: [id number] Arrival date & time: 03/09/2011  5:50 PM   First MD Initiated Contact with Patient 03/09/11 2330      Chief Complaint  Patient presents with  . Fall    (Consider location/radiation/quality/duration/timing/severity/associated sxs/prior treatment) Patient is a 70 y.o. male presenting with fall. The history is provided by the patient.  Fall Pertinent negatives include no abdominal pain and no headaches.   the patient is a 70 year old male, with diabetes mellitus and hypertension, who presents to the emergency department with right knee pain after he fell from the third step on a ladder onto his wooden deck.  He landed on his right leg, which was twisted underneath him.  He denies hitting his head.  He denies loss of consciousness, neck pain or pain anywhere besides his right knee.  He has never had a broken bone in the past.  He has no allergies to medications.  Past Medical History  Diagnosis Date  . Obstructive sleep apnea (adult) (pediatric)   . Unspecified essential hypertension   . Cerebrovascular disease, unspecified   . Unspecified venous (peripheral) insufficiency   . Pure hypercholesterolemia   . Type II or unspecified type diabetes mellitus without mention of complication, not stated as uncontrolled   . Overweight   . Diaphragmatic hernia without mention of obstruction or gangrene   . Esophageal reflux   . Diverticulosis of colon (without mention of hemorrhage)   . Other specified congenital anomaly of kidney   . Malignant neoplasm of kidney, except pelvis   . Calculus of kidney   . Osteoarthrosis, unspecified whether generalized or localized, unspecified site   . Gout, unspecified   . Lumbago   . Anxiety state, unspecified     Past Surgical History  Procedure Date  . Incisional hernia repair 1/96    Dr.Leone  . Nasal sinus surgery 12/96    Dr.Redman  . Cholecystectomy 9/04    Dr. Maryagnes Amos  . Partial nephrectomy 1994    renal cell  cancer in horseshoe kidney  . Carotid endarterectomy 09/2000    Dr.Lawson  . Veterbral art angioplasty     x2  . Small intestine surgery     Family History  Problem Relation Age of Onset  . Heart attack Father   . Breast cancer      History  Substance Use Topics  . Smoking status: Never Smoker   . Smokeless tobacco: Never Used  . Alcohol Use: No      Review of Systems  HENT: Negative for nosebleeds and neck pain.   Eyes: Negative for visual disturbance.  Cardiovascular: Negative for chest pain.  Gastrointestinal: Negative for abdominal pain.  Musculoskeletal: Positive for back pain and joint swelling.       Chronic back pain  Right knee pain and swelling  Skin:       No contusion  Neurological: Negative for headaches.  Psychiatric/Behavioral: Negative for confusion.    Allergies  Hydromorphone hcl and Promethazine hcl  Home Medications   Current Outpatient Rx  Name Route Sig Dispense Refill  . ALLOPURINOL 300 MG PO TABS  TAKE ONE TABLET BY MOUTH EVERY DAY 30 tablet 5  . ALPRAZOLAM 0.5 MG PO TABS  Take 1/2 to 1 tablet by mouth three times daily as needed for nerves 90 tablet 0  . ASPIRIN 325 MG PO TBEC Oral Take 325 mg by mouth daily.      Marland Kitchen DOCUSATE SODIUM 100 MG PO CAPS Oral Take 100  mg by mouth daily.      . GLYBURIDE 5 MG PO TABS  Take 2 tablets by mouth two times daily 360 tablet 3  . METFORMIN HCL 500 MG PO TABS  TAKE TWO TABLETS BY MOUTH TWICE DAILY 360 tablet 3  . ONE-DAILY MULTI VITAMINS PO TABS Oral Take 1 tablet by mouth daily.      Marland Kitchen OMEPRAZOLE MAGNESIUM 20 MG PO TBEC Oral Take 20 mg by mouth daily.      Marland Kitchen PIOGLITAZONE HCL 30 MG PO TABS Oral Take 1 tablet (30 mg total) by mouth daily. 30 tablet 11  . PLAVIX 75 MG PO TABS  TAKE ONE TABLET BY MOUTH EVERY DAY 90 each 0  . POLYETHYLENE GLYCOL 3350 PO POWD Oral Take 17 g by mouth 2 (two) times daily. Take one scoop in 8 ounces of water two times a day as needed.     Marland Kitchen ROSUVASTATIN CALCIUM 20 MG PO TABS  Oral Take 1 tablet (20 mg total) by mouth at bedtime. 30 tablet 11  . SAXAGLIPTIN HCL 5 MG PO TABS Oral Take 1 tablet (5 mg total) by mouth daily. 30 tablet 11    BP 160/52  Pulse 105  Temp(Src) 97.6 F (36.4 C) (Oral)  Resp 18  SpO2 99%  Physical Exam  Constitutional: He is oriented to person, place, and time.       Obese  HENT:  Head: Normocephalic and atraumatic.  Eyes: Pupils are equal, round, and reactive to light.  Neck: Normal range of motion. Neck supple.  Cardiovascular: Normal rate and regular rhythm.   No murmur heard. Pulmonary/Chest: Effort normal and breath sounds normal.  Abdominal: Soft. There is no tenderness.  Musculoskeletal:       Right knee swelling with tenderness.  No ecchymosis, or deformity  2+ dorsalis pedis pulse and posterior tibial pulse on the right-hand side.  No limb length  difference  Neurological: He is alert and oriented to person, place, and time.  Skin: Skin is warm and dry.  Psychiatric: He has a normal mood and affect.    ED Course  Procedures (including critical care time)  Right knee tibial plateau fracture.  No other injuries will do preop evaluation and admit the patient to Dr. Luiz Blare, the orthopedist   Labs Reviewed  CBC  DIFFERENTIAL  BASIC METABOLIC PANEL   Dg Knee Complete 4 Views Right  03/09/2011  *RADIOLOGY REPORT*  Clinical Data: Fall from ladder with right knee injury.  RIGHT KNEE - COMPLETE 4+ VIEW  Comparison: None.  Findings: Comminuted fracture of the proximal tibia present demonstrating multiple planes and mild displacement.  Fracture plane does extend up into the central joint at the level of the tibial spines.  The distal femur and proximal fibula appear intact. Cross-table lateral view does show a lipohemarthrosis with elongated fluid level present.  IMPRESSION: Comminuted proximal tibial fracture with involvement of the central tibial plateau.  Associated lipohemarthrosis.  Original Report Authenticated By:  Reola Calkins, [name]    12:15 AM Discussed with Dr. Luiz Blare.  He will admit the patient for surgical repair of his knee.  ED ECG REPORT   Date: 03/10/2011  EKG Time: 00:34  Rate: 107  Rhythm: sinus tachycardia,  sinus tachycardia  Axis: nl  Intervals:none  ST&T Change: none  Narrative Interpretation: sinus tach            MDM  Right knee tibial plateau fracture        Nicholes Stairs,  MD 03/10/11 0014  Nicholes Stairs, MD 03/10/11 0015  Nicholes Stairs, MD 03/10/11 [phone]

## 2011-03-11 ENCOUNTER — Inpatient Hospital Stay (HOSPITAL_COMMUNITY): Payer: Medicare Other

## 2011-03-11 DIAGNOSIS — S82143A Displaced bicondylar fracture of unspecified tibia, initial encounter for closed fracture: Secondary | ICD-10-CM

## 2011-03-11 LAB — GLUCOSE, CAPILLARY
Glucose-Capillary: 133 mg/dL — ABNORMAL HIGH (ref 70–99)
Glucose-Capillary: 154 mg/dL — ABNORMAL HIGH (ref 70–99)

## 2011-03-11 LAB — CBC
HCT: 34.6 % — ABNORMAL LOW (ref 39.0–52.0)
Hemoglobin: 10.9 g/dL — ABNORMAL LOW (ref 13.0–17.0)
MCHC: 31.5 g/dL (ref 30.0–36.0)
WBC: 7.5 10*3/uL (ref 4.0–10.5)

## 2011-03-11 LAB — VITAMIN D 25 HYDROXY (VIT D DEFICIENCY, FRACTURES): Vit D, 25-Hydroxy: 31 ng/mL (ref 30–89)

## 2011-03-11 MED ORDER — METOCLOPRAMIDE HCL 10 MG PO TABS
5.0000 mg | ORAL_TABLET | Freq: Three times a day (TID) | ORAL | Status: DC | PRN
  Administered 2011-03-12 – 2011-03-14 (×2): 10 mg via ORAL
  Filled 2011-03-11 (×3): qty 1

## 2011-03-11 MED ORDER — LACTATED RINGERS IV SOLN: INTRAVENOUS | Status: DC

## 2011-03-11 MED ORDER — ACETAMINOPHEN 325 MG PO TABS
325.0000 mg | ORAL_TABLET | Freq: Four times a day (QID) | ORAL | Status: DC | PRN
  Administered 2011-03-21 – 2011-03-23 (×2): 650 mg via ORAL
  Filled 2011-03-11 (×2): qty 2

## 2011-03-11 MED ORDER — LISINOPRIL 5 MG PO TABS
5.0000 mg | ORAL_TABLET | Freq: Every day | ORAL | Status: DC
  Administered 2011-03-11 – 2011-03-16 (×5): 5 mg via ORAL
  Filled 2011-03-11 (×7): qty 1

## 2011-03-11 MED ORDER — CEFAZOLIN SODIUM 1-5 GM-% IV SOLN
1.0000 g | Freq: Three times a day (TID) | INTRAVENOUS | Status: AC
  Administered 2011-03-11 (×2): 1 g via INTRAVENOUS
  Filled 2011-03-11 (×3): qty 50

## 2011-03-11 MED ORDER — SENNOSIDES-DOCUSATE SODIUM 8.6-50 MG PO TABS
1.0000 | ORAL_TABLET | Freq: Every day | ORAL | Status: DC
  Administered 2011-03-11 – 2011-03-26 (×14): 1 via ORAL
  Filled 2011-03-11 (×17): qty 1

## 2011-03-11 MED ORDER — ONDANSETRON HCL 4 MG/2ML IJ SOLN
4.0000 mg | Freq: Four times a day (QID) | INTRAMUSCULAR | Status: DC | PRN
  Administered 2011-03-12 – 2011-03-15 (×3): 4 mg via INTRAVENOUS
  Filled 2011-03-11 (×3): qty 2

## 2011-03-11 MED ORDER — DOCUSATE SODIUM 100 MG PO CAPS
100.0000 mg | ORAL_CAPSULE | Freq: Two times a day (BID) | ORAL | Status: DC
  Administered 2011-03-11 – 2011-03-27 (×25): 100 mg via ORAL
  Filled 2011-03-11 (×34): qty 1

## 2011-03-11 MED ORDER — OXYCODONE-ACETAMINOPHEN 5-325 MG PO TABS
1.0000 | ORAL_TABLET | Freq: Four times a day (QID) | ORAL | Status: DC | PRN
  Administered 2011-03-11 – 2011-03-16 (×8): 2 via ORAL
  Filled 2011-03-11 (×9): qty 2

## 2011-03-11 NOTE — Anesthesia Postprocedure Evaluation (Signed)
  Anesthesia Post-op Note  Patient: Peter Nicholson  Procedure(s) Performed:  EXTERNAL FIXATION LEG  Patient Location: PACU  Anesthesia Type: General  Level of Consciousness: sedated  Airway and Oxygen Therapy: Patient Spontanous Breathing and Patient connected to nasal cannula oxygen  Post-op Pain: mild  Post-op Assessment: Patient's Cardiovascular Status Stable, Respiratory Function Stable and Pain level controlled  Post-op Vital Signs: stable  Complications: No apparent anesthesia complications

## 2011-03-11 NOTE — Progress Notes (Signed)
Subjective: 1 Day Post-Op Procedure(s) (LRB): EXTERNAL FIXATION LEG (Right)  Patient is doing well Minimal complaints Pain is fairly well-controlled Denies any numbness or tingling in his right lower extremity Tolerating diet No nausea, vomiting, diarrhea Denies any chest pain, no shortness of breath  Objective: Current Vitals Blood pressure 167/65, pulse 96, temperature 99 F (37.2 C), temperature source Oral, resp. rate 18, SpO2 99.00%. Vital signs in last 24 hours: Temp:  [98.4 F (36.9 C)-100.6 F (38.1 C)] 99 F (37.2 C) (11/28 0557) Pulse Rate:  [90-100] 96  (11/28 0557) Resp:  [15-18] 18  (11/28 0557) BP: (148-177)/(65-85) 167/65 mmHg (11/28 0557) SpO2:  [95 %-99 %] 99 % (11/28 0557)  Intake/Output from previous day: 11/27 0701 - 11/28 0700 In: 3550 [I.V.:3300; IV Piggyback:250] Out: 1550 [Urine:1400; Blood:150]  LABS  Basename 03/11/11 0547 03/09/11 2304  HGB 10.9* 11.4*    Basename 03/11/11 0547 03/09/11 2304  WBC 7.5 9.0  RBC 3.42* 3.48*  HCT 34.6* 34.5*  PLT 171 173    Basename 03/10/11 0930 03/09/11 2304  NA 141 144  K 3.9 3.8  CL 109 113*  CO2 24 23  BUN 16 22  CREATININE 0.99 1.07  GLUCOSE 233* 134*  CALCIUM 8.7 9.1    Basename 03/10/11 0930  LABPT --  INR 1.05   CBGs: 133-234 Hemoglobin A1c: 7.2  Metabolic bone panel: Pending  Physical Exam  Gen: Appears well no acute distress, eating breakfast Lungs: Clear to auscultation bilaterally Cardiac: S1 and S2 Abd:+ Bowel sounds Ext: Right lower extremity  External fixator is intact, proximal pin sites and thigh are stable and dry, distal pin sites demonstrate serosanguineous drainage Kerlix is somewhat saturated.  Ace wrap is intact as well  Deep peroneal nerve, superficial peroneal nerve, tibial nerve sensory functions are intact  EHL, FHL, anterior tibialis, posterior tibialis, peroneals, gastrocsoleus complex motor function are intact  Extremity is warm with a palpable dorsalis  pedis pulse  Compartments are soft and nontender, no pain with passive stretching   Imaging Dg Chest 1 View  03/10/2011  *RADIOLOGY REPORT*  Clinical Data: Preoperative respiratory exam for right tibial fracture.  CHEST - 1 VIEW  Comparison: 08/06/2008  Findings: Some degree of underlying chronic lung disease is suspected.  No evidence of overt edema or infiltrate.  Heart is mildly enlarged.  The aorta is tortuous.  No pleural effusions.  IMPRESSION: Chronic lung disease and mild cardiomegaly.  Original Report Authenticated By: Reola Calkins, [name]   Dg Knee 1-2 Views Right  03/10/2011  *RADIOLOGY REPORT*  Clinical Data: Next fixed right tibial plateau fracture  RIGHT KNEE - 1-2 VIEW  Comparison: CT 03/10/1999  Findings:   Interval placement  of external fixation device spanning the knee joint.  Comminuted fracture of the tibial plateau noted.  IMPRESSION: External fixator placement for complex tibial plateau fracture.  Original Report Authenticated By: Genevive Bi, [name]   Ct Knee Right Wo Contrast  03/10/2011  *RADIOLOGY REPORT*  Clinical Data: Status post fall from roof, with injury to the right knee.  Comminuted tibial fracture noted on radiograph.  Request further evaluation on CT.  CT OF THE RIGHT KNEE WITHOUT CONTRAST  Technique:  Multidetector CT imaging was performed according to the standard protocol. Multiplanar CT image reconstructions were also generated.  Comparison: Right knee radiographs performed 03/09/2011  Findings: There is a significantly comminuted fracture through the proximal tibia, compatible with a complex Schatzker type V fracture, or a type C3 fracture in the OTA classification.  Two fracture lines are seen extending to the lateral tibial plateau, and there is significant comminution along the medial tibial plateau.  There is an oblique fracture line extending across the tibial spine, with mild displacement.  Fracture fragments are posteriorly displaced and slightly  angulated.  There is fragmentation at the level of the tibiofibular articulation.  A nearly horizontal metaphyseal fracture line is noted distally, without definite extension into the diaphysis.  The proximal fibula remains intact.  The distal femur also appears intact.  A moderate lipohemarthrosis is noted at the knee joint.  The medial and lateral menisci are not well characterized; there is mild lateral compartment widening.  The posterior cruciate ligament remains grossly intact; the anterior cruciate ligament is difficult to fully assess.  The medial collateral ligament is unremarkable in appearance.  There is disruption at the distal insertions of portions of the lateral collateral ligament complex.  The patellar tendon and quadriceps tendons are within normal limits.  Mild prepatellar soft tissue swelling is noted.  There is no evidence of significant disruption of the underlying vasculature.  Scattered soft tissue edema is noted about the fracture site.  IMPRESSION:  1.  Significantly comminuted fracture through the proximal tibia, compatible with a complex Schatzker type V fracture, or a type C3 fracture in the OTA classification.  Nearly horizontal metaphyseal fracture line noted distally, without definite extension to the diaphysis. 2.  Moderate lipohemarthrosis at the knee joint. 3.  Anterior cruciate ligament not well assessed; disruption at the distal insertions of portions of the lateral collateral ligament complex.  Original Report Authenticated By: Tonia Ghent, [name]   Dg Knee Complete 4 Views Right  03/09/2011  *RADIOLOGY REPORT*  Clinical Data: Fall from ladder with right knee injury.  RIGHT KNEE - COMPLETE 4+ VIEW  Comparison: None.  Findings: Comminuted fracture of the proximal tibia present demonstrating multiple planes and mild displacement.  Fracture plane does extend up into the central joint at the level of the tibial spines.  The distal femur and proximal fibula appear intact.  Cross-table lateral view does show a lipohemarthrosis with elongated fluid level present.  IMPRESSION: Comminuted proximal tibial fracture with involvement of the central tibial plateau.  Associated lipohemarthrosis.  Original Report Authenticated By: Reola Calkins, [name]   Dg Knee Right Port  03/10/2011  *RADIOLOGY REPORT*  Clinical Data: External fixator placement.  PORTABLE RIGHT KNEE - 1-2 VIEW  Comparison: Intraoperative films, same date.  Findings: An external fixator is in place.  Slight improved position and alignment of the complex comminuted tibial plateau fractures.  IMPRESSION: External fixator in place with improved position and alignment of the tibial fractures.  Original Report Authenticated By: P. Loralie Champagne, [name]   Dg Ankle Right Port  03/10/2011  *RADIOLOGY REPORT*  Clinical Data: Proximal tibia fracture.  Ankle swelling and tenderness medially.  PORTABLE RIGHT ANKLE - 2 VIEW  Comparison: Right knee radiographs 03/09/2011  Findings: The ankle is located.  No acute fracture is identified. There is diffuse subcutaneous swelling, most prominent adjacent to the lateral malleolus.  Peripheral vascular calcifications are noted posteriorly.  IMPRESSION:  1.  Soft tissue swelling diffusely, and most prominent laterally. 2.  No acute bony abnormality identified.  Original Report Authenticated By: Britta Mccreedy, [name]   Dg C-arm 1-60 Min  03/10/2011  CLINICAL DATA: x-fix tibial plateau fracture   C-ARM 1-60 MINUTES  Fluoroscopy was utilized by the requesting physician.  No radiographic  interpretation.      Assessment/Plan: 1 Day Post-Op  Procedure(s) (LRB): EXTERNAL FIXATION LEG (Right)   70 year old male status post fall with Schatzker V fracture status post external fixation  1. Comminuted right tibial plateau fracture status post external fixation postoperative day #1  Nonweightbearing , will be nonweightbearing 8 weeks after we definitively fix his fracture via plate  osteosynthesis. However given his venous insufficiency, diabetes and other medical issues his external fixator may be definitive treatment if the swelling does not resolve in a timely manner. Who reviewed his new CT scan after application of his x-rays to evaluate the fracture pattern.  Continue with aggressive ice elevation and compression wraps. Elevation should be above patient's heart  Daily pin care consisting of washing his pin sites with soap and water, dry and really well and then wrapping in the skin interfaces with Kerlix wrap.  PT/ OT consults today: Patient has 4 stairs to navigate into his dwelling. He will need to safely navigate stairs with therapy before we can discharge him to home. He does live with his wife I do think that the patient can discharge home rather than a skilled nursing facility with home health therapy.  I encouraged patient to continue with that ankle and toe range of motion. Therapy can work with a heel cord stretching as well as Thera-band exercises for the ankle as well.  2. Type 2 diabetes  For control hospital with sugars consistently above 200.  patient reports that he checks his sugars one time a week at home  He is also not see his primary care doctor in over a year.  Would recommend followup with PCP upon discharge to optimize sugar control.  3. Hypertension  Patient is running high and has been running high consistently while admitted. I suspect that this is his baseline and he is hypertensive to begin with as reflected by his problem list.  Patient is on no hypertensive medications from home  I will start him on lisinopril low dose 5 mg by mouth daily to see her response. Selected this medication secondary to nephro protective effects secondary to his diabetes  4. Hypercholesterolemia   Home medications 5. Anxiety   Continue meds 6. venous insufficiency   See #1  Is imperative for him to keep the swelling under control with particular concern for  his right leg and her ability to perform surgery sooner rather than later.  7. cerebrovascular disease  Will start patient on Lovenox today for DVT PE prophylaxis we'll continue to hold his Plavix as we do anticipate surgery in the near future.  8. GERD  Home medication 9. Pain  DC PCA  Encourage by mouth medications 10. DVT/PE prophylaxis  Lovenox 30 mg subcutaneous daily  11. FEN  Diabetic diet 12. Disposition  Hopeful to DC home tomorrow follow up in office in 5-7 days. Hopeful for surgery in 7-10 days.  PT/OT consults today  Home health consult  Mearl Latin, PA-C 03/11/2011, 8:12 AM

## 2011-03-11 NOTE — Progress Notes (Signed)
Physical Therapy Evaluation Patient Details Name: Peter Nicholson MRN: [id number] DOB: 10/06/1940 Today's Date: 03/11/2011  Problem List:  Patient Active Problem List  Diagnoses  . RENAL CELL CANCER  . DIABETES MELLITUS  . VITAMIN B12 DEFICIENCY  . HYPERCHOLESTEROLEMIA  . GOUT  . OVERWEIGHT  . ANXIETY  . OBSTRUCTIVE SLEEP APNEA  . HYPERTENSION  . CEREBROVASCULAR DISEASE  . VENOUS INSUFFICIENCY  . ESOPHAGEAL STRICTURE  . GERD  . HIATAL HERNIA  . DIVERTICULOSIS OF COLON  . CONSTIPATION  . NEPHROLITHIASIS  . DEGENERATIVE JOINT DISEASE  . BACK PAIN, LUMBAR  . HORSESHOE KIDNEY  . FECAL OCCULT BLOOD  . Closed fracture of tibial plateau    Past Medical History:  Past Medical History  Diagnosis Date  . Obstructive sleep apnea (adult) (pediatric)   . Unspecified essential hypertension   . Cerebrovascular disease, unspecified   . Unspecified venous (peripheral) insufficiency   . Pure hypercholesterolemia   . Type II or unspecified type diabetes mellitus without mention of complication, not stated as uncontrolled   . Overweight   . Diaphragmatic hernia without mention of obstruction or gangrene   . Esophageal reflux   . Diverticulosis of colon (without mention of hemorrhage)   . Other specified congenital anomaly of kidney   . Malignant neoplasm of kidney, except pelvis   . Calculus of kidney   . Osteoarthrosis, unspecified whether generalized or localized, unspecified site   . Gout, unspecified   . Lumbago   . Anxiety state, unspecified   . Stroke    Past Surgical History:  Past Surgical History  Procedure Date  . Incisional hernia repair 1/96    Dr.Leone  . Nasal sinus surgery 12/96    Dr.Redman  . Cholecystectomy 9/04    Dr. Maryagnes Amos  . Partial nephrectomy 1994    renal cell cancer in horseshoe kidney  . Carotid endarterectomy 09/2000    Dr.Lawson  . Veterbral art angioplasty     x2  . Small intestine surgery     PT Assessment/Plan/Recommendation PT  Assessment Clinical Impression Statement: pt very motivated to Max I with R Tib plateau fx and external hardware.  pt notes plan is to have ORIF in ~1wk.   PT Recommendation/Assessment: Patient will need skilled PT in the acute care venue PT Problem List: Decreased strength;Decreased activity tolerance;Decreased balance;Decreased mobility;Decreased knowledge of use of DME;Other (comment) Barriers to Discharge: None PT Therapy Diagnosis : Difficulty walking;Acute pain PT Plan PT Frequency: Min 5X/week PT Treatment/Interventions: DME instruction;Gait training;Stair training;Functional mobility training;Therapeutic exercise;Balance training;Patient/family education PT Recommendation Recommendations for Other Services: OT consult Follow Up Recommendations: Skilled nursing facility Equipment Recommended: Defer to next venue PT Goals  Acute Rehab PT Goals PT Goal Formulation: With patient Time For Goal Achievement: 2 weeks Pt will go Supine/Side to Sit: with supervision PT Goal: Supine/Side to Sit - Progress: Progressing toward goal Pt will go Sit to Supine/Side: with supervision PT Goal: Sit to Supine/Side - Progress: Progressing toward goal Pt will Transfer Sit to Stand/Stand to Sit: with supervision;with upper extremity assist PT Transfer Goal: Sit to Stand/Stand to Sit - Progress: Progressing toward goal Pt will Transfer Bed to Chair/Chair to Bed: with supervision PT Transfer Goal: Bed to Chair/Chair to Bed - Progress: Progressing toward goal Pt will Go Up / Down Stairs: 3-5 stairs;with min assist;with rolling walker PT Goal: Up/Down Stairs - Progress: Not met  PT Evaluation Precautions/Restrictions  Precautions Precautions: Fall Restrictions Weight Bearing Restrictions: Yes RLE Weight Bearing: Non weight bearing Prior Functioning  Home Living Lives With: Spouse Receives Help From: Family Type of Home: House Prior Function Level of Independence: Independent with basic  ADLs;Independent with homemaking with ambulation;Independent with gait;Independent with transfers Able to Take Stairs?: Reciprically Driving: Yes Cognition Cognition Orientation Level: Oriented X4 Sensation/Coordination   Extremity Assessment RLE Assessment RLE Assessment:  (pt with External Hardware) LLE Assessment LLE Assessment: Within Functional Limits Mobility (including Balance) Bed Mobility Bed Mobility: Yes Supine to Sit: 1: +2 Total assist;With rails (pt 60%) Supine to Sit Details (indicate cue type and reason): cues for safe technique  Sitting - Scoot to Edge of Bed: 4: Min assist Sitting - Scoot to [place] of Bed Details (indicate cue type and reason): A with R LE only Transfers Transfers: Yes Sit to Stand: 3: Mod assist;With upper extremity assist;From bed Sit to Stand Details (indicate cue type and reason): cues for use of UEs, positioning of LEs Stand to Sit: 4: Min assist;With armrests;With upper extremity assist;To chair/3-in-1 Stand to Sit Details: cues for positioning of LEs and use of UEs to control descent Ambulation/Gait Ambulation/Gait: Yes Ambulation/Gait Assistance: 4: Min assist Ambulation/Gait Assistance Details (indicate cue type and reason): cues for NWBing and use of RW Ambulation Distance (Feet): 5 Feet Assistive device: Rolling walker Gait Pattern:  (hop-to) Stairs: No Wheelchair Mobility Wheelchair Mobility: No    Exercise    End of Session PT - End of Session Equipment Utilized During Treatment: Gait belt Activity Tolerance: Patient tolerated treatment well Patient left: in chair;with call bell in reach Nurse Communication: Mobility status for transfers General Behavior During Session: [facility] for tasks performed Cognition: [facility] for tasks performed  Sunny Schlein, [place] [phone] 03/11/2011, 10:44 AM

## 2011-03-11 NOTE — Progress Notes (Signed)
CARE MANAGEMENT NOTE 03/11/2011  Patient:  Peter Nicholson, Peter Nicholson   Account Number:  [id number]  Date Initiated:  03/11/2011  Documentation initiated by:  Vance Peper  Subjective/Objective Assessment:   70 yr old male s/p right tibial plateau fracture     Action/Plan:   Discharge planning. Spoke with patient.  Surgery will be at a later date d/t swelling. Pateint not sure if he can go home now as he doesnt want wifew to have to take care of him totally. will discuss options with MD and f/u with patient.        Status of service:  In process, will continue to follow

## 2011-03-11 NOTE — H&P (Signed)
I have seen and examined the patient. I agree with the findings above.  Menaal Russum H 03/11/2011 8:20 AM

## 2011-03-12 ENCOUNTER — Encounter (HOSPITAL_COMMUNITY): Payer: Self-pay | Admitting: Neurology

## 2011-03-12 ENCOUNTER — Inpatient Hospital Stay (HOSPITAL_COMMUNITY): Payer: Medicare Other

## 2011-03-12 ENCOUNTER — Other Ambulatory Visit: Payer: Self-pay

## 2011-03-12 LAB — GLUCOSE, CAPILLARY

## 2011-03-12 LAB — CBC
HCT: 33.1 % — ABNORMAL LOW (ref 39.0–52.0)
MCHC: 32 g/dL (ref 30.0–36.0)
MCV: 100.3 fL — ABNORMAL HIGH (ref 78.0–100.0)
RDW: 12.5 % (ref 11.5–15.5)

## 2011-03-12 LAB — PTH, INTACT AND CALCIUM
Calcium, Total (PTH): 8.2 mg/dL — ABNORMAL LOW (ref 8.4–10.5)
PTH: 66.2 pg/mL (ref 14.0–72.0)

## 2011-03-12 MED ORDER — HYDRALAZINE HCL 20 MG/ML IJ SOLN
10.0000 mg | Freq: Once | INTRAMUSCULAR | Status: AC
  Administered 2011-03-12: 10 mg via INTRAVENOUS
  Filled 2011-03-12: qty 0.5

## 2011-03-12 MED ORDER — METOCLOPRAMIDE HCL 5 MG/ML IJ SOLN
10.0000 mg | Freq: Three times a day (TID) | INTRAMUSCULAR | Status: AC
  Administered 2011-03-12 – 2011-03-13 (×3): 10 mg via INTRAVENOUS
  Filled 2011-03-12 (×3): qty 2

## 2011-03-12 MED ORDER — HYDROCHLOROTHIAZIDE 12.5 MG PO CAPS
12.5000 mg | ORAL_CAPSULE | Freq: Every day | ORAL | Status: DC
  Administered 2011-03-12 – 2011-03-13 (×2): 12.5 mg via ORAL
  Filled 2011-03-12 (×2): qty 1

## 2011-03-12 MED ORDER — HYDRALAZINE HCL 20 MG/ML IJ SOLN
10.0000 mg | INTRAMUSCULAR | Status: DC | PRN
  Filled 2011-03-12: qty 0.5

## 2011-03-12 MED ORDER — IOHEXOL 350 MG/ML SOLN
100.0000 mL | Freq: Once | INTRAVENOUS | Status: AC | PRN
  Administered 2011-03-12: 100 mL via INTRAVENOUS

## 2011-03-12 MED ORDER — ASPIRIN 325 MG PO TABS
325.0000 mg | ORAL_TABLET | Freq: Every day | ORAL | Status: DC
  Administered 2011-03-12 – 2011-03-27 (×15): 325 mg via ORAL
  Filled 2011-03-12 (×16): qty 1

## 2011-03-12 NOTE — Progress Notes (Signed)
Called to assist with patient with neurological changes.  RN reported LSW at 1730.  Family came in at 1800 and stated patient was having hard time finding correct word.  No other focal deficit noted per RN Corrie Dandy.  BP stable.  CBG 89.  Patient denies pain or dizziness.  History of CVA with no residuals.    Met patient in CT scan.  NIHSS 0 currently.  Patient states all is normal at this point.  CT scan ordered by Montez Morita, PA per [facility].  I spoke with Dr. Thad Ranger - Neuro - agreed that patient should be neuro consult and not Code Stroke - patient with surgery 2 days ago - has external fixator.  Not tPA candidate  - NIHSS 0 at this time.  RN to report findings and recommendations to Montez Morita, PA.

## 2011-03-12 NOTE — Progress Notes (Signed)
Subjective: 2 Days Post-Op Procedure(s) (LRB): EXTERNAL FIXATION LEG (Right)  Patient doing well today. Pain is controlled. Tolerating diet Denies any numbness or tingling in right lower extremity Worked well with physical therapy yesterday Patient feels at a skilled nursing facility would be best for him as his house is not really suitable for him to return home in his current condition. Nor does he feel that his wife can manage him at this current time. Patient denies any chest pain no shortness of breath, no nausea, no vomiting, no diarrhea. Patient's blood pressure at 0530 was 190/76, but patient denies any headaches. Patient denies any lightheadedness or dizziness.  Objective: Current Vitals Blood pressure 190/76, pulse 95, temperature 99.2 F (37.3 C), temperature source Oral, resp. rate 18, SpO2 100.00%. Vital signs in last 24 hours: Temp:  [98.9 F (37.2 C)-100.3 F (37.9 C)] 99.2 F (37.3 C) (11/29 0548) Pulse Rate:  [83-109] 95  (11/29 0548) Resp:  [18] 18  (11/29 0548) BP: (155-190)/(64-76) 190/76 mmHg (11/29 0548) SpO2:  [96 %-100 %] 100 % (11/29 0548)  Intake/Output from previous day: 11/28 0701 - 11/29 0700 In: 1942.5 [P.O.:480; I.V.:1462.5] Out: 1050 [Urine:1050]  LABS  Basename 03/12/11 0605 03/11/11 0547 03/09/11 2304  HGB 10.6* 10.9* 11.4*    Basename 03/12/11 0605 03/11/11 0547  WBC 7.9 7.5  RBC 3.30* 3.42*  HCT 33.1* 34.6*  PLT 158 171    Basename 03/10/11 0930 03/09/11 2304  NA 141 144  K 3.9 3.8  CL 109 113*  CO2 24 23  BUN 16 22  CREATININE 0.99 1.07  GLUCOSE 233* 134*  CALCIUM 8.7 9.1    Basename 03/10/11 0930  LABPT --  INR 1.05    Ionized calcium 1.18 Phosphorus 2.4  Vitamin D 31 Pre-albumin 22.0  CBGs 96-154   Physical Exam  Gen: Appears well no acute distress pleasant Lungs: Clear bilaterally Cardiac: S1 and S2 Abd: Positive bowel sounds, nontender with palpation Ext: Right lower extremity  External fixator is  stable  Pin sites look good, dressings removed there is no erythema or active drainage from proximal or distal pin sites  Ace wrap is removed as well, swelling is decreasing nicely.  Extremity is warm with palpable dorsalis pedis pulse, no deep calf tenderness, no pain with passive stretching  Deep peroneal nerve, superficial peroneal nerve, tibial nerve sensory functions are intact  EHL, FHL, anterior tibialis, posterior tibialis, gastrocsoleus complex motor function are intact     Imaging  no new imaging since yesterday   Assessment/Plan: 2 Days Post-Op Procedure(s) (LRB): EXTERNAL FIXATION LEG (Right)  70 year old male status post fall with Schatzker V fracture status post external fixation  1. Comminuted right tibial plateau fracture status post external fixation postoperative day #2  Nonweightbearing right lower extremity  Continue with aggressive ice, elevation, compressive wraps  Continue with physical therapy/occupational therapy  Patient would benefit from skilled nursing facility placement given limited help at home  Soft tissue swelling is resolving nicely I would anticipate we will likely be ready for open reduction and internal fixation within the next week.  Continue with daily pin care consisting of washing his pin sites with soap and water and wrapping the pin sites with a kerlix wrap.  Pin care was performed by myself today without any issues.  Again patient can be lifted by his external fixator and I would encourage movement by this method.  2. Type 2 diabetes  Improved control  Continue with current regimen  Recommend followup with PCP  to review diabetic medications 3.  hypertension    poor control noted this morning  We'll add hydrochlorothiazide to his hypertensive regimen, 12.5 mg by mouth daily and continue with lisinopril secondary to diabetes  Will also add an as needed hydralazine for severely elevated BP 4. medical issues  Stable, continue his home  medications 5. DVT/PE prophylaxis  Continue with Lovenox, this will continue at the skilled nursing facility as well and we'll likely transition him to Coumadin after ORIF  Continue to hold Plavix 6. FEN  Diabetic diet 7. Pain  Continue with by mouth medications 8. Disposition   Begin skilled nursing facility search, patient is stable for [facility] to [facility] when bed [facility].  Continue to monitor BP  Mearl Latin, PA-C 03/12/2011, 8:54 AM

## 2011-03-12 NOTE — Plan of Care (Signed)
Problem: Phase III Progression Outcomes Goal: Maintains weight - bearing per MD orders Outcome: Completed/Met Date Met:  03/12/11 Pt completed OT eval and ambulated to door area maintaining NWB RLE

## 2011-03-12 NOTE — Progress Notes (Signed)
Occupational Therapy Evaluation Patient Details Name: Peter Nicholson MRN: [id number] DOB: 11/14/1940 Today's Date: 03/12/2011  Problem List:  Patient Active Problem List  Diagnoses  . RENAL CELL CANCER  . DIABETES MELLITUS  . VITAMIN B12 DEFICIENCY  . HYPERCHOLESTEROLEMIA  . GOUT  . OVERWEIGHT  . ANXIETY  . OBSTRUCTIVE SLEEP APNEA  . HYPERTENSION  . CEREBROVASCULAR DISEASE  . VENOUS INSUFFICIENCY  . ESOPHAGEAL STRICTURE  . GERD  . HIATAL HERNIA  . DIVERTICULOSIS OF COLON  . CONSTIPATION  . NEPHROLITHIASIS  . DEGENERATIVE JOINT DISEASE  . BACK PAIN, LUMBAR  . HORSESHOE KIDNEY  . FECAL OCCULT BLOOD  . Closed fracture of tibial plateau    Past Medical History:  Past Medical History  Diagnosis Date  . Obstructive sleep apnea (adult) (pediatric)   . Unspecified essential hypertension   . Cerebrovascular disease, unspecified   . Unspecified venous (peripheral) insufficiency   . Pure hypercholesterolemia   . Type II or unspecified type diabetes mellitus without mention of complication, not stated as uncontrolled   . Overweight   . Diaphragmatic hernia without mention of obstruction or gangrene   . Esophageal reflux   . Diverticulosis of colon (without mention of hemorrhage)   . Other specified congenital anomaly of kidney   . Malignant neoplasm of kidney, except pelvis   . Calculus of kidney   . Osteoarthrosis, unspecified whether generalized or localized, unspecified site   . Gout, unspecified   . Lumbago   . Anxiety state, unspecified   . Stroke    Past Surgical History:  Past Surgical History  Procedure Date  . Incisional hernia repair 1/96    Dr.Leone  . Nasal sinus surgery 12/96    Dr.Redman  . Cholecystectomy 9/04    Dr. Maryagnes Amos  . Partial nephrectomy 1994    renal cell cancer in horseshoe kidney  . Carotid endarterectomy 09/2000    Dr.Lawson  . Veterbral art angioplasty     x2  . Small intestine surgery     OT Assessment/Plan/Recommendation OT  Assessment OT Recommendation/Assessment: Patient will need skilled OT in the acute care venue OT Problem List: Decreased activity tolerance;Impaired balance (sitting and/or standing);Decreased safety awareness;Decreased knowledge of use of DME or AE;Pain OT Therapy Diagnosis : Generalized weakness OT Plan OT Frequency: Min 2X/week OT Treatment/Interventions: Self-care/ADL training;Energy conservation;DME and/or AE instruction;Therapeutic activities;Patient/family education;Balance training OT Recommendation Follow Up Recommendations: Home health OT Equipment Recommended: Defer to next venue Individuals Consulted Consulted and Agree with Results and Recommendations: Patient OT Goals Acute Rehab OT Goals OT Goal Formulation: With patient Time For Goal Achievement: 2 weeks ADL Goals Pt Will Perform Grooming: with modified independence;Sitting at sink;Supported Pt Will Perform Upper Body Bathing: with modified independence;Sitting at sink Pt Will Perform Lower Body Bathing: with modified independence;Sitting at sink Pt Will Perform Upper Body Dressing: with modified independence;Sitting, chair Pt Will Perform Lower Body Dressing: with modified independence;Sit to stand from chair;Supported;with adaptive equipment Pt Will Transfer to Toilet: with modified independence;Stand pivot transfer;with DME;3-in-1 Pt Will Perform Toileting - Clothing Manipulation: with modified independence;Sitting on 3-in-1 or toilet Pt Will Perform Toileting - Hygiene: with modified independence;Sit to stand from 3-in-1/toilet Miscellaneous OT Goals Miscellaneous OT Goal #1: pt will perform bed mobility without rails/trapeze bar MIn A with HOB 20 degrees or less as precursor to ADLS OT Goal: Miscellaneous Goal #1 - Progress: Other (comment)  OT Evaluation Precautions/Restrictions  Precautions Precautions: Fall Restrictions Weight Bearing Restrictions: Yes RLE Weight Bearing: Non weight bearing Prior  Functioning  Home Living Lives With: Spouse Receives Help From: Family Type of Home: House Prior Function Level of Independence: Independent with basic ADLs;Independent with homemaking with ambulation;Independent with homemaking with wheelchair;Independent with gait Able to Take Stairs?: Reciprically Driving: Yes ADL ADL Eating/Feeding: Performed;Set up Where Assessed - Eating/Feeding: Chair Toilet Transfer: Performed;+2 Total assistance;Comment for patient % (80) Toilet Transfer Details (indicate cue type and reason): transfer Lt Toilet Transfer Method: Stand pivot Acupuncturist: Set designer - Clothing Manipulation: Performed;Minimal assistance Where Assessed - Glass blower/designer Manipulation: Sit to stand from 3-in-1 or toilet Toileting - Hygiene: Simulated Equipment Used: Rolling walker ADL Comments: pt c/o of abdominal discomfort, pt unable to void bowel Vision/Perception  Vision - History Baseline Vision: Bifocals Patient Visual Report: No change from baseline Vision - Assessment Eye Alignment: Within Functional Limits Cognition Cognition Arousal/Alertness: Awake/alert Overall Cognitive Status: Appears within functional limits for tasks assessed Orientation Level: Oriented X4 Sensation/Coordination Coordination Gross Motor Movements are Fluid and Coordinated: Yes Fine Motor Movements are Fluid and Coordinated: Yes Extremity Assessment RUE Assessment RUE Assessment: Within Functional Limits Mobility  Bed Mobility Bed Mobility: Yes Supine to Sit: 1: +2 Total assist;With rails;HOB elevated (Comment degrees);Other (comment) (hob 30% pt 80% mod v/c for sequence LLE to boost buttock) Supine to Sit Details (indicate cue type and reason): Patient 80%, assist of right leg and trunk (during transition from trapeeze to bedrail).  Verbal cues for hand placement and 1/2 bridge technique to get to EOB.   Sitting - Scoot to [place] of Bed: 4: Min assist;With  rail Sitting - Scoot to [place] of Bed Details (indicate cue type and reason): pt using LLE to help boost buttock off bed and using overhead trapeze bar Transfers Transfers: Yes Sit to Stand: 1: +2 Total assist;With upper extremity assist;From bed (pt 80%) Stand to Sit: 3: Mod assist;With upper extremity assist;To chair/3-in-1 Exercises Other Exercises Other Exercises: OT instructed patient on R ankle DF stretch using sheet. (pt stretching RLE with sheet to prevent need for foot drop ) End of Session OT - End of Session Equipment Utilized During Treatment: Gait belt Activity Tolerance: Patient tolerated treatment well Patient left: in chair;with call bell in reach Nurse Communication: Mobility status for ambulation;Mobility status for transfers General Behavior During Session: [facility] for tasks performed Cognition: [facility] for tasks performed  Next session could benefit from : bed mobility, toilet transfer, energy conservation sink level grooming    Lucile Shutters 03/12/2011, 3:17 PM  Pager: [phone]

## 2011-03-12 NOTE — Consult Note (Signed)
Reason for Consult:TIA Referring Physician: Kree Rafter is an 70 y.o. male.  HPI:  The patient is a 70 year old right-handed white male with a history of prior cerebrovascular disease and a prior left carotid endarterectomy. The patient has come to the hospital at this time following a fracture of the right leg following a fall at home. The patient was in his usual state of health, lasting normal round 5:30 PM today by the family. The family went to supper, and then came back around 6:15 PM. When the patient tried to speak, he was unable to talk intelligibly. The patient was mixing up words. This episode lasted 5-10 minutes. The episode fully resolved. The patient denied any headache, visual changes, or numbness or weakness of the face, arms, or legs. The patient denies any prior episodes similar to this. The patient currently indicates that he is at his goal baseline. The patient denies receiving any pain medications prior to the episode of aphasia. A CBG evaluation was 89 around the time of the event. The patient was sent down for a CT scan of the brain that shows evidence of an old right cerebellar infarct, without acute changes seen. A CT angiogram of the head and neck was done and this reportedly was unremarkable. Neurology was asked to see this patient further evaluation. The patient had been taken off of Plavix prior to admission, but aspirin has now been added back to the regimen. NIH stroke scale score was 0. The patient was not a TPA candidate secondary to rapid improvement in deficit, and recent surgery on the right leg. The patient is not a candidate for MRI evaluation secondary to metal pins in the right leg.    Past Medical History  Diagnosis Date  . Obstructive sleep apnea (adult) (pediatric)   . Unspecified essential hypertension   . Cerebrovascular disease, unspecified   . Unspecified venous (peripheral) insufficiency   . Pure hypercholesterolemia   . Type II or  unspecified type diabetes mellitus without mention of complication, not stated as uncontrolled   . Overweight   . Diaphragmatic hernia without mention of obstruction or gangrene   . Esophageal reflux   . Diverticulosis of colon (without mention of hemorrhage)   . Other specified congenital anomaly of kidney   . Malignant neoplasm of kidney, except pelvis   . Calculus of kidney   . Osteoarthrosis, unspecified whether generalized or localized, unspecified site   . Gout, unspecified   . Lumbago   . Anxiety state, unspecified   . Stroke     Past Surgical History  Procedure Date  . Incisional hernia repair 1/96    Dr.Leone  . Nasal sinus surgery 12/96    Dr.Redman  . Cholecystectomy 9/04    Dr. Maryagnes Amos  . Partial nephrectomy 1994    renal cell cancer in horseshoe kidney  . Carotid endarterectomy 09/2000    Dr.Lawson  . Veterbral art angioplasty     x2  . Small intestine surgery     Family History  Problem Relation Age of Onset  . Heart attack Father   . Breast cancer      Social History:  reports that he has never smoked. He has never used smokeless tobacco. He reports that he does not drink alcohol or use illicit drugs.  Allergies:  Allergies  Allergen Reactions  . Hydromorphone Hcl     REACTION: hallucinations  . Promethazine Hcl     REACTION: hallucinations    Medications:  Scheduled:   .  allopurinol  300 mg Oral Daily  . aspirin  325 mg Oral Daily  . ceFAZolin (ANCEF) IV  1 g Intravenous Q8H  . docusate sodium  100 mg Oral BID  . enoxaparin (LOVENOX) injection  30 mg Subcutaneous Q24H  . glyBURIDE  10 mg Oral BID WC  . hydrALAZINE  10 mg Intravenous Once  . hydrochlorothiazide  12.5 mg Oral Daily  . insulin aspart  0-15 Units Subcutaneous TID WC  . insulin aspart  0-5 Units Subcutaneous QHS  . insulin aspart  4 Units Subcutaneous TID WC  . linagliptin  5 mg Oral Daily  . lisinopril  5 mg Oral Daily  . metFORMIN  1,000 mg Oral BID WC  . metoCLOPramide  (REGLAN) injection  10 mg Intravenous Q8H  . multivitamins ther. w/minerals  1 tablet Oral Daily  . omeprazole  20 mg Oral Q0700  . pioglitazone  30 mg Oral Daily  . polyethylene glycol  17 g Oral BID  . rosuvastatin  20 mg Oral QHS  . senna-docusate  1 tablet Oral QHS   Continuous:   . 0.9 % NaCl with KCl 20 mEq / L 1,000 mL (03/12/11 1325)  . lactated ringers     UJW:JXBJYNWGNFAOZ, ALPRAZolam, hydrALAZINE, iohexol, methocarbamol(ROBAXIN) IV, methocarbamol, metoCLOPramide, morphine injection, ondansetron (ZOFRAN) IV, oxyCODONE, oxyCODONE-acetaminophen  Results for orders placed during the hospital encounter of 03/09/11 (from the past 48 hour(s))  CBC     Status: Abnormal   Collection Time   03/11/11  5:47 AM      Component Value Range Comment   WBC 7.5  4.0 - 10.5 (K/uL)    RBC 3.42 (*) 4.22 - 5.81 (MIL/uL)    Hemoglobin 10.9 (*) 13.0 - 17.0 (g/dL)    HCT 30.8 (*) 65.7 - 52.0 (%)    MCV 101.2 (*) 78.0 - 100.0 (fL)    MCH 31.9  26.0 - 34.0 (pg)    MCHC 31.5  30.0 - 36.0 (g/dL)    RDW 84.6  96.2 - 95.2 (%)    Platelets 171  150 - 400 (K/uL)   VITAMIN D 25 HYDROXY     Status: Normal   Collection Time   03/11/11  5:47 AM      Component Value Range Comment   Vit D, 25-Hydroxy 31  30 - 89 (ng/mL)   PTH, INTACT AND CALCIUM     Status: Abnormal   Collection Time   03/11/11  5:47 AM      Component Value Range Comment   PTH 66.2  14.0 - 72.0 (pg/mL)    Calcium, Total (PTH) 8.2 (*) 8.4 - 10.5 (mg/dL)   PREALBUMIN     Status: Normal   Collection Time   03/11/11  5:47 AM      Component Value Range Comment   Prealbumin 22.0  17.0 - 34.0 (mg/dL)   GLUCOSE, CAPILLARY     Status: Abnormal   Collection Time   03/11/11  5:58 AM      Component Value Range Comment   Glucose-Capillary 133 (*) 70 - 99 (mg/dL)   CALCIUM, IONIZED     Status: Normal   Collection Time   03/11/11  6:12 AM      Component Value Range Comment   Calcium, Ion 1.18  1.12 - 1.32 (mmol/L)   GLUCOSE, CAPILLARY      Status: Abnormal   Collection Time   03/11/11 11:04 AM      Component Value Range Comment   Glucose-Capillary 154 (*)  70 - 99 (mg/dL)    Comment 1 Notify RN     GLUCOSE, CAPILLARY     Status: Abnormal   Collection Time   03/11/11  4:15 PM      Component Value Range Comment   Glucose-Capillary 135 (*) 70 - 99 (mg/dL)    Comment 1 Notify RN     GLUCOSE, CAPILLARY     Status: Abnormal   Collection Time   03/11/11 10:20 PM      Component Value Range Comment   Glucose-Capillary 110 (*) 70 - 99 (mg/dL)    Comment 1 Documented in Chart      Comment 2 Notify RN     CBC     Status: Abnormal   Collection Time   03/12/11  6:05 AM      Component Value Range Comment   WBC 7.9  4.0 - 10.5 (K/uL)    RBC 3.30 (*) 4.22 - 5.81 (MIL/uL)    Hemoglobin 10.6 (*) 13.0 - 17.0 (g/dL)    HCT 16.1 (*) 09.6 - 52.0 (%)    MCV 100.3 (*) 78.0 - 100.0 (fL)    MCH 32.1  26.0 - 34.0 (pg)    MCHC 32.0  30.0 - 36.0 (g/dL)    RDW 04.5  40.9 - 81.1 (%)    Platelets 158  150 - 400 (K/uL)   GLUCOSE, CAPILLARY     Status: Normal   Collection Time   03/12/11  6:55 AM      Component Value Range Comment   Glucose-Capillary 96  70 - 99 (mg/dL)   GLUCOSE, CAPILLARY     Status: Normal   Collection Time   03/12/11 11:05 AM      Component Value Range Comment   Glucose-Capillary 86  70 - 99 (mg/dL)    Comment 1 Documented in Chart      Comment 2 Notify RN     GLUCOSE, CAPILLARY     Status: Abnormal   Collection Time   03/12/11  4:26 PM      Component Value Range Comment   Glucose-Capillary 108 (*) 70 - 99 (mg/dL)    Comment 1 Documented in Chart      Comment 2 Notify RN     GLUCOSE, CAPILLARY     Status: Normal   Collection Time   03/12/11  6:22 PM      Component Value Range Comment   Glucose-Capillary 89  70 - 99 (mg/dL)    Comment 1 Documented in Chart      Comment 2 Notify RN     GLUCOSE, CAPILLARY     Status: Normal   Collection Time   03/12/11  7:31 PM      Component Value Range Comment    Glucose-Capillary 94  70 - 99 (mg/dL)     Ct Knee Right Wo Contrast  03/11/2011  *RADIOLOGY REPORT*  Clinical Data:  Fall from ladder.  Knee pain.  CT RIGHT KNEE WITHOUT CONTRAST  Technique:  Multidetector CT imaging of the right knee was performed according to the standard protocol without intravenous contrast. Multiplanar CT image reconstructions were also generated.  Comparison:  03/10/2011.  Findings:  Comminuted bicondylar tibial plateau fracture is present.  The there is a transverse fracture through the metaphysis compatible with Schatzker six fracture.  No definite meniscal entrapment.  Lipohemarthrosis.  Extensor mechanism intact. Downsloping of the posterior lateral tibial plateau.  Nondisplaced oblique fracture through the fibular head and neck (image 31 series 602).  Fractures  do extend along the distal medial collateral ligament attachment to the tibia.  Transverse tibial plateau fracture is non depressed.  Mild impaction. Femoral condyles appear intact.  IMPRESSION: Schatzker VI bicondylar tibial plateau fracture.  Nondisplaced fracture of the fibular head and neck.  3-DIMENSIONAL CT IMAGE RENDERING AT INDEPENDENT WORKSTATION:  Technique: 3-dimensional CT images were rendered by post-processing of the original CT data at independent workstation.  The 3- dimensional CT images were interpreted, and findings were reported in the accompanying complete CT report for this study.  Findings:  Schatzker six bicondylar tibial plateau fracture with lipohemarthrosis is well demonstrated on three-dimensional reconstructions.  IMPRESSION: As above.  Original Report Authenticated By: Andreas Newport, [name]   Ct 3d Recon At Scanner  03/11/2011  *RADIOLOGY REPORT*  Clinical Data:  Fall from ladder.  Knee pain.  CT RIGHT KNEE WITHOUT CONTRAST  Technique:  Multidetector CT imaging of the right knee was performed according to the standard protocol without intravenous contrast. Multiplanar CT image reconstructions  were also generated.  Comparison:  03/10/2011.  Findings:  Comminuted bicondylar tibial plateau fracture is present.  The there is a transverse fracture through the metaphysis compatible with Schatzker six fracture.  No definite meniscal entrapment.  Lipohemarthrosis.  Extensor mechanism intact. Downsloping of the posterior lateral tibial plateau.  Nondisplaced oblique fracture through the fibular head and neck (image 31 series 602).  Fractures do extend along the distal medial collateral ligament attachment to the tibia.  Transverse tibial plateau fracture is non depressed.  Mild impaction. Femoral condyles appear intact.  IMPRESSION: Schatzker VI bicondylar tibial plateau fracture.  Nondisplaced fracture of the fibular head and neck.  3-DIMENSIONAL CT IMAGE RENDERING AT INDEPENDENT WORKSTATION:  Technique: 3-dimensional CT images were rendered by post-processing of the original CT data at independent workstation.  The 3- dimensional CT images were interpreted, and findings were reported in the accompanying complete CT report for this study.  Findings:  Schatzker six bicondylar tibial plateau fracture with lipohemarthrosis is well demonstrated on three-dimensional reconstructions.  IMPRESSION: As above.  Original Report Authenticated By: Andreas Newport, [name]    @ROS @  Review of systems is notable for no recent fevers or chills.  Patient reports no headache, visual field changes, or dizziness.  The patient indicates no shortness of breath, chest pains, or palpitations. The patient reports no abdominal pain or nausea or vomiting.  The patient does note some constipation since admission, but denies problems controlling the bladder.  The patient denies any focal numbness or weakness of the face, arms, or legs.   Blood pressure 159/76, pulse 119, temperature 99.4 F (37.4 C), temperature source Oral, resp. rate 20, SpO2 94.00%.  Physical Examination:  General:  The patient is alert and cooperative  at the time of the examination. The patient is oriented x3.  Bilateral carotid bruits are noted.  Respiratory:  Lungs fields are clear to auscultation bilaterally.  Cardiovascular:  Examination reveals a regular rate and rhythm, no obvious murmurs or rubs are noted.  Abdominal Exam:  Abdomen is soft and nontender, positive bowel sounds are noted. No organomegaly is noted.  Extremities:  Extremities are notable for 1+ edema at the left ankle, with a surgical bracket on the right lower extremity.    Neurologic Examination  Cranial Nerves:  Facial symmetry is present. Pupils are equal, round, and reactive to light. Visual fields are full. Speech is normal, no aphasia or dysarthria is noted. Pin prick sensation on the face is symmetric and normal.  Motor Examination: Motor  testing of all 4 extremities reveals normal strength of the arms and the legs, however the right leg could not be tested.  Sensory Examination: Sensory examination of the arms and legs shows normal pinprick, soft touch, and vibration sensation throughout.  Cerebellar Examination: The patient has good finger-nose-finger and heel-to-shin bilaterally, but the right leg could not be tested. Gait was not tested.  Deep Tendon Reflexes: Deep tendon reflexes are symmetric and normal throughout, but the right leg could not be tested. Toes are downgoing bilaterally.   Interval: 2 hrs posttreatment (11/29 2100) Level of Consciousness (1a. ): Alert, keenly responsive (11/29 2100) LOC Questions (1b. ): Answers both questions correctly (11/29 2100) LOC Commands (1c. ): Performs both tasks correctly (11/29 2100) Best Gaze (2. ): Normal (11/29 2100) Visual (3. ): No visual loss (11/29 2100) Facial Palsy (4. ): Normal symmetrical movements (11/29 2100) Motor Arm, Left (5a. ): No drift (11/29 2100) Motor Arm, Right (5b. ): No drift (11/29 2100) Motor Leg, Left (6a. ): No drift (11/29 2100) Motor Leg, Right (6b. ): Amputation  or joint fusion (11/29 2100) Limb Ataxia (7. ): Absent (11/29 2100) Sensory (8. ): Normal, no sensory loss (11/29 2100) Best Language (9. ): No aphasia (11/29 2100) Dysarthria (10. ): Normal (11/29 2100) Inattention/Extinction: No Abnormality (11/29 2100) Total: 0  (11/29 2100)   Assessment/Plan:  1. Transient aphasia  2. History of cerebrovascular disease  3. Diabetes  4. Hypertension  5. Recent right lower extremity fracture requiring surgery  This patient is not a TPA candidate secondary to resolution of symptoms, and recent trauma and surgery. The patient is back on aspirin therapy. The patient is not a MRI candidate secondary to the surgical pinning of the right leg. The patient will be set up for a 2-D echocardiogram. We will follow the patient's clinical course while in house.    Grier Czerwinski KEITH 03/12/2011, 9:28 PM

## 2011-03-12 NOTE — Progress Notes (Signed)
Utilization review completed. Lyncoln Maskell, RN, BSN. 03/12/11  

## 2011-03-12 NOTE — Progress Notes (Signed)
Physical Therapy Treatment Patient Details Name: Peter Nicholson MRN: [id number] DOB: 1940/08/19 Today's Date: 03/12/2011  PT Assessment/Plan  PT - Assessment/Plan Comments on Treatment Session: The patient is progressing well with mobility.  Chair to follow for gait for safety and encouraged increase in gait distance.  I asked patient if he could get his wife to bring a left shoe that laces up for him to wear while mobilizing with PT/OT.  It will give him extra lift and pad his left foot on the hard floor.   PT Plan: Discharge plan remains appropriate PT Frequency: Min 5X/week Follow Up Recommendations: Skilled nursing facility Equipment Recommended: Defer to next venue PT Goals  Acute Rehab PT Goals PT Goal: Supine/Side to Sit - Progress: Progressing toward goal PT Transfer Goal: Sit to Stand/Stand to Sit - Progress: Progressing toward goal PT Transfer Goal: Bed to Chair/Chair to Bed - Progress: Progressing toward goal Additional Goals Additional Goal #1: Patient will independently report and maintain NWB right leg 100% of treatment session. PT Goal: Additional Goal #1 - Progress: Progressing toward goal  PT Treatment Precautions/Restrictions  Precautions Precautions: Fall Restrictions Weight Bearing Restrictions: Yes RLE Weight Bearing: Non weight bearing (pt was able to verbalize this when asked.  ) Mobility (including Balance) Bed Mobility Bed Mobility: Yes Supine to Sit: 1: +2 Total assist;With rails;HOB elevated (Comment degrees);Other (comment) (and trapeeze) Supine to Sit Details (indicate cue type and reason): Patient 80%, assist of right leg and trunk (during transition from trapeeze to bedrail).  Verbal cues for hand placement and 1/2 bridge technique to get to EOB.   Sitting - Scoot to Edge of Bed: 4: Min assist;With rail Sitting - Scoot to [place] of Bed Details (indicate cue type and reason): min assist of right leg .  Patient able to weight shift on bed using bilateral  arms and bedrail.   Transfers Squat Pivot Transfers: 1: +2 Total assist;Patient percentage (comment);With upper extremity assistance;With armrests (bedrail) Squat Pivot Transfer Details (indicate cue type and reason): patient 80% support of trunk for balance and safety and support of right leg to maintain NWB.  Patient went from bed to 3-in-1 BSC on his left side.   Ambulation/Gait Ambulation/Gait Assistance: 4: Min assist Ambulation/Gait Assistance Details (indicate cue type and reason): min assist to stabilize patient and RW for balance.  Encouragement given to go further.  Patient reports his arms were fatiguing quickly.   Ambulation Distance (Feet): 15 Feet Assistive device: Rolling walker Gait Pattern:  (hop-to gait pattern with NWB R foot.  )    Exercise  Other Exercises Other Exercises: OT instructed patient on R ankle DF stretch using sheet.  See OT notes for details.   End of Session PT - End of Session Equipment Utilized During Treatment: Gait belt (RW) Activity Tolerance: Patient tolerated treatment well;Patient limited by fatigue;Patient limited by pain Patient left: in chair;with call bell in reach Nurse Communication: Mobility status for transfers (to RN tech) General Behavior During Session: [facility] for tasks performed Cognition: [facility] for tasks performed  Rosielee Corporan B. Yanci Bachtell, PT, DPT [phone]  03/12/2011, 2:12 PM

## 2011-03-13 ENCOUNTER — Encounter (HOSPITAL_COMMUNITY): Payer: Self-pay | Admitting: Orthopedic Surgery

## 2011-03-13 LAB — BASIC METABOLIC PANEL
BUN: 13 mg/dL (ref 6–23)
Calcium: 8.6 mg/dL (ref 8.4–10.5)
GFR calc Af Amer: 76 mL/min — ABNORMAL LOW (ref >90)
GFR calc non Af Amer: 66 mL/min — ABNORMAL LOW (ref >90)
Potassium: 4.3 mEq/L (ref 3.5–5.1)

## 2011-03-13 LAB — VITAMIN D 1,25 DIHYDROXY
Vitamin D2 1, 25 (OH)2: <8 pg/mL
Vitamin D3 1, 25 (OH)2: 40 pg/mL

## 2011-03-13 LAB — CBC
HCT: 33.1 % — ABNORMAL LOW (ref 39.0–52.0)
MCHC: 32.6 g/dL (ref 30.0–36.0)
RDW: 12.7 % (ref 11.5–15.5)

## 2011-03-13 LAB — GLUCOSE, CAPILLARY
Glucose-Capillary: 144 mg/dL — ABNORMAL HIGH (ref 70–99)
Glucose-Capillary: 84 mg/dL (ref 70–99)

## 2011-03-13 MED ORDER — OXYCODONE-ACETAMINOPHEN 5-325 MG PO TABS: 1.0000 | ORAL_TABLET | Freq: Four times a day (QID) | ORAL | Status: AC | PRN

## 2011-03-13 MED ORDER — LISINOPRIL 5 MG PO TABS: 5.0000 mg | ORAL_TABLET | Freq: Every day | ORAL | Status: DC

## 2011-03-13 MED ORDER — ACETAMINOPHEN 325 MG PO TABS: 325.0000 mg | ORAL_TABLET | Freq: Four times a day (QID) | ORAL | Status: AC | PRN

## 2011-03-13 MED ORDER — HYDROCHLOROTHIAZIDE 12.5 MG PO CAPS
25.0000 mg | ORAL_CAPSULE | Freq: Every day | ORAL | Status: DC
  Administered 2011-03-14 – 2011-03-16 (×2): 25 mg via ORAL
  Filled 2011-03-13 (×4): qty 2

## 2011-03-13 MED ORDER — OXYCODONE HCL 5 MG PO TABS: 5.0000 mg | ORAL_TABLET | ORAL | Status: AC | PRN

## 2011-03-13 MED ORDER — ENOXAPARIN SODIUM 40 MG/0.4ML ~~LOC~~ SOLN
40.0000 mg | Freq: Every day | SUBCUTANEOUS | Status: DC
  Administered 2011-03-14 – 2011-03-23 (×10): 40 mg via SUBCUTANEOUS
  Filled 2011-03-13 (×10): qty 0.4

## 2011-03-13 MED ORDER — METHOCARBAMOL 500 MG PO TABS: 500.0000 mg | ORAL_TABLET | Freq: Four times a day (QID) | ORAL | Status: DC | PRN

## 2011-03-13 MED ORDER — HYDROCHLOROTHIAZIDE 12.5 MG PO CAPS: 12.5000 mg | ORAL_CAPSULE | Freq: Every day | ORAL | Status: DC

## 2011-03-13 MED ORDER — ENOXAPARIN SODIUM 30 MG/0.3ML ~~LOC~~ SOLN: 30.0000 mg | SUBCUTANEOUS | Status: DC

## 2011-03-13 MED ORDER — PIOGLITAZONE HCL 30 MG PO TABS: 30.0000 mg | ORAL_TABLET | Freq: Every day | ORAL | Status: DC

## 2011-03-13 NOTE — Progress Notes (Signed)
Physical Therapy Treatment Patient Details Name: Peter Nicholson MRN: [id number] DOB: 10-14-1940 Today's Date: 03/13/2011  PT Assessment/Plan  PT - Assessment/Plan Comments on Treatment Session: Pt had L shoe today and continues to participate in therapy. Transfers proressing well, but gait still limited 2* UE fatigue. BPs elevated today, but nursing aware and pt on meds to lower. Pt with no sx's and monitored throughout.  PT Plan: Discharge plan remains appropriate Equipment Recommended: Defer to next venue PT Goals  Acute Rehab PT Goals PT Transfer Goal: Sit to Stand/Stand to Sit - Progress: Progressing toward goal Pt will Ambulate: 16 - 50 feet;with supervision;with rolling walker PT Goal: Ambulate - Progress: Progressing toward goal Additional Goals PT Goal: Additional Goal #1 - Progress: Progressing toward goal  PT Treatment Precautions/Restrictions  Precautions Precautions: Fall Restrictions Weight Bearing Restrictions: Yes RLE Weight Bearing: Non weight bearing Mobility (including Balance) Bed Mobility Bed Mobility: No (Up up in chair upon arrival) Supine to Sit: 3: Mod assist;With rails;HOB elevated (Comment degrees) (35) Supine to Sit Details (indicate cue type and reason): using trapeze and LLE knee flexion to boost buttock off surface Sitting - Scoot to Edge of Bed: 4: Min assist;With rail Transfers Transfers: Yes Sit to Stand: 3: Mod assist;With upper extremity assist;From chair/3-in-1 Sit to Stand Details (indicate cue type and reason): Cues for sequencing and hand placement Stand to Sit: 4: Min assist Stand to Sit Details: Cues for sequencing and hand placement Ambulation/Gait Ambulation/Gait: Yes Ambulation/Gait Assistance: 4: Min assist Ambulation/Gait Assistance Details (indicate cue type and reason): Cues for RW management and NWB, distance limited due to UE fatigue. A given for steadying Ambulation Distance (Feet): 15 Feet (1x15, 1x5 with theraputic rest  between) Assistive device: Rolling walker Gait Pattern: Decreased dorsiflexion - right;Trunk flexed (hop-to pattern, NWB R LE) Stairs: No    Exercise  Other Exercises Other Exercises: AP 2x15 bil with decreased ROM R End of Session PT - End of Session Equipment Utilized During Treatment: Gait belt Activity Tolerance: Patient limited by fatigue;Patient limited by pain Patient left: in chair;with call bell in reach Nurse Communication: Mobility status for ambulation General Behavior During Session: [facility] for tasks performed Cognition: [facility] for tasks performed  [facility] [place], [place] [phone]  03/13/2011, 1:51 PM

## 2011-03-13 NOTE — Progress Notes (Signed)
Stroke Team Progress Note  SUBJECTIVE The patient is a 70 year old right-handed white male with a history of prior cerebrovascular disease and a prior left carotid endarterectomy. The patient has come to the hospital at this time following a fracture of the right leg following a fall at home.The patient had held his Plavix for 5 days for planned back steroid injection. The patient was in his usual state of health, lasting normal round 5:30 PM today by the family. The family went to supper, and then came back around 6:15 PM. When the patient tried to speak, he was unable to talk intelligibly. The patient was mixing up words. This episode lasted 5-10 minutes. The episode fully resolved. The patient denied any headache, visual changes, or numbness or weakness of the face, arms, or legs. The patient denies any prior episodes similar to this. The patient currently indicates that he is at his goal baseline. The patient denies receiving any pain medications prior to the episode of aphasia. A CBG evaluation was 89 around the time of the event. The patient was sent down for a CT scan of the brain that shows evidence of an old right cerebellar infarct, without acute changes seen. A CT angiogram of the head and neck was done and this reportedly was unremarkable. Neurology was asked to see this patient further evaluation. The patient had been taken off of Plavix prior to admission, but aspirin has now been added back to the regimen. NIH stroke scale score was 0. The patient was not a TPA candidate secondary to rapid improvement in deficit, and recent surgery on the right leg. The patient is not a candidate for MRI evaluation secondary to metal pins in the right leg.  Ortho is room this am. Discussed with Dr. Pearlean Brownie plans for OR next week.  OBJECTIVE Most recent Vital Signs: Temp: 99 F (37.2 C) (11/30 0503) BP: 171/81 mmHg (11/30 1009) Pulse Rate: 102  (11/30 1009) Respiratory Rate: 18 O2 Saturdation: 98%  CBG  (last 3)   Basename 03/13/11 0605 03/12/11 1931 03/12/11 1822  GLUCAP 106* 94 89   Intake/Output from previous day: 11/29 0701 - 11/30 0700 In: 2249.9 [P.O.:1080; I.V.:1169.9] Out: 1100 [Urine:1100]  IV Fluid Intake:      . 0.9 % NaCl with KCl 20 mEq / L 20 mL/hr at 03/13/11 0646  . DISCONTD: lactated ringers     Diet:  Carb Control thin liquids  Activity:  Up with assistance  DVT Prophylaxis:  Lovenox 30 mg sq daily 3  Studies: CBC    Component Value Date/Time   WBC 9.9 03/13/2011 0707   RBC 3.33* 03/13/2011 0707   HGB 10.8* 03/13/2011 0707   HCT 33.1* 03/13/2011 0707   PLT 195 03/13/2011 0707   MCV 99.4 03/13/2011 0707   MCH 32.4 03/13/2011 0707   MCHC 32.6 03/13/2011 0707   RDW 12.7 03/13/2011 0707   LYMPHSABS 1.0 03/09/2011 2304   MONOABS 0.7 03/09/2011 2304   EOSABS 0.3 03/09/2011 2304   BASOSABS 0.0 03/09/2011 2304   CMP    Component Value Date/Time   NA 139 03/13/2011 0707   K 4.3 03/13/2011 0707   CL 106 03/13/2011 0707   CO2 24 03/13/2011 0707   GLUCOSE 111* 03/13/2011 0707   BUN 13 03/13/2011 0707   CREATININE 1.10 03/13/2011 0707   CALCIUM 8.6 03/13/2011 0707   CALCIUM 8.2* 03/11/2011 0547   PROT 6.3 10/08/2010 0816   ALBUMIN 3.1* 03/10/2011 0930   AST 19 10/08/2010 0816  ALT 25 10/08/2010 0816   ALKPHOS 70 10/08/2010 0816   BILITOT 0.5 10/08/2010 0816   GFRNONAA 66* 03/13/2011 0707   GFRAA 76* 03/13/2011 0707   COAGS Lab Results  Component Value Date   INR 1.05 03/10/2011   INR 1.1 08/06/2008   Lipid Panel    Component Value Date/Time   CHOL 106 10/08/2010 0816   TRIG 95.0 10/08/2010 0816   HDL 40.90 10/08/2010 0816   CHOLHDL 3 10/08/2010 0816   VLDL 19.0 10/08/2010 0816   LDLCALC 46 10/08/2010 0816   HgbA1C  Lab Results  Component Value Date   HGBA1C 7.2* 03/10/2011   Urine Drug Screen  No results found for this basename: labopia,  cocainscrnur,  labbenz,  amphetmu,  thcu,  labbarb    Alcohol Level No results found for this  basename: eth      CT angio ---  2D Echocardiogram  ordered  EKG  normal sinus rhythm with sinus arrhythmia  Physical Exam  Patient alert and oriented x 3. Speech clear. No aphasia. No dysarthria. Extraoccular movements intact. Visual fields full. Face symmetric. Tongue midline. Moves all extremities x 4, RLE in cast with pins limit evaluation but moves toes and ankle well.. Strength normal. Coordination normal. Sensation intact. Heart rate regular. Breath sounds clear.   ASSESSMENT [name] is a 70 y.o. male with a left hemispheric TIA after plavix was being held for back steroid injection. He is  on aspirin 81 mg orally every day for secondary stroke prevention following leg fracture from fall at home. Was on plavix prior to admission. Had TIA sx once off Plavix x 5 days in preparation for injection for back pain.  Stroke risk factors:  diabetes mellitus, hyperlipidemia, hypertension and OSA, prior stroke  Hospital day # 4  TREATMENT/PLAN Continue aspirin 81 mg orally every day for secondary stroke prevention. Ok for leg surgery next week. Recommend holdoing on back injection as typically ASA is held and we do not recommended holding ASA at this time due to stroke risk. Resume plavix post op when able.D/w patient and orthopedic surgeon.  Joaquin Music, ANP-BC, GNP-BC [facility] Pager: [phone] 03/13/2011 10:41 AM  Dr. Delia Heady, Stroke Center Medical Director, has personally reviewed chart, pertinent data, examined the patient and developed the plan of care.

## 2011-03-13 NOTE — Progress Notes (Cosign Needed)
Subjective: 3 Days Post-Op Procedure(s) (LRB): EXTERNAL FIXATION LEG (Right)   Patient doing much better this morning. No significant complaints. No new issues his speech he is back at baseline. No focal neuro deficits are noted. No weakness is appreciated. He is passing gas and feeling somewhat better Diet was somewhat limited yesterday as he did not on that much. Patient denies any chest pain, shortness of breath, new onset nausea or vomiting. Yesterday around the 1815 patient's family noticed that the patient was having difficulty speaking. This episode lasted about 10 minutes and then resolve however given his history of cerebrovascular disease we did obtain a angiogram of his head and neck unremarkable. I did ask neurology to see the patient as he is a patient of Dr. Thad Ranger. The timing to see the patient did not find any acute issues neurologic examination. Patient has been off Plavix approximately 8 days prior to admission to the hospital as he was getting ready for epidural steroid injection and has remained off since. In the hospital he has been on Lovenox as well as aspirin. Patient has also had persistent elevation in blood pressure as well.  Objective: Current Vitals Blood pressure 172/80, pulse 105, temperature 99 F (37.2 C), temperature source Oral, resp. rate 18, height 5\' 7"  (1.702 m), weight 93.26 kg (205 lb 9.6 oz), SpO2 98.00%. Vital signs in last 24 hours: Temp:  [98.2 F (36.8 C)-99.6 F (37.6 C)] 99 F (37.2 C) (11/30 0503) Pulse Rate:  [105-119] 105  (11/30 0503) Resp:  [18-20] 18  (11/30 0503) BP: (153-177)/(59-80) 172/80 mmHg (11/30 0503) SpO2:  [94 %-98 %] 98 % (11/30 0503) Weight:  [93.26 kg (205 lb 9.6 oz)] 205 lb 9.6 oz (93.26 kg) (11/29 2000)  Intake/Output from previous day: 11/29 0701 - 11/30 0700 In: 2249.9 [P.O.:1080; I.V.:1169.9] Out: 1100 [Urine:1100]  LABS  Basename 03/13/11 0707 03/12/11 0605 03/11/11 0547  HGB 10.8* 10.6* 10.9*     Basename 03/13/11 0707 03/12/11 0605  WBC 9.9 7.9  RBC 3.33* 3.30*  HCT 33.1* 33.1*  PLT 195 158    Basename 03/13/11 0707 03/11/11 0547  NA 139 --  K 4.3 --  CL 106 --  CO2 24 --  BUN 13 --  CREATININE 1.10 --  GLUCOSE 111* --  CALCIUM 8.6 8.2*    Basename 03/10/11 0930  LABPT --  INR 1.05    Physical Exam  Gen: Appears well, in no acute distress, pleasant. Answers all questions appropriately. Mood and affect are normal Lungs: Clear bilaterally Cardiac: S1 and S2 Abd: Positive bowel sounds but distended. Nontender Ext: Right lower extremity  External fixator is stable and intact  Pin sites and dressing with excellent  Deep peroneal nerve, superficial peroneal nerve, tibial nerve sensory functions are intact  EHL, FHL, anterior tibialis, posterior tibialis, peroneals, gastrocsoleus complex motor function intact as well   no deep calf tenderness is noted  Compartments are soft and nontender  Extremities warm with palpable dorsalis pedis pulse   Assessment/Plan: 3 Days Post-Op Procedure(s) (LRB): EXTERNAL FIXATION LEG (Right)  70 year old male status post fall with complex right tibial plateau fracture postoperative day #3 post external fixation   1. Comminuted right tibial plateau fracture post external fixation  Nonweightbearing right lower extremity  Aggressive ice and elevation for swelling control  ACE or compressive wraps for swelling control  Continue with physical therapy and occupational therapy  Daily pin site care. Clean pins with soap and water, remove any coagulum and wrap pin sites with  Kerlix wrap to create a snug bone/skin/pin interface  Patient can be lifted light external fixator to facilitate mobilization  2. type 2 diabetes  Good control in the hospital  Continue with current meds as well as SSI in the nursing home. 3. Hypertension  Sporadic control, continue to monitor 4. Cerebrovascular disease  Appreciate neurology consult  yesterday  2D echocardiogram is pending, CTA was reportedly negative for any acute findings.  Symptoms were transient and appeared to resolve quickly  We will continue aspirin but I would like to continue to hold Plavix as we would anticipate surgery within the next week.  We will continue with Lovenox for anticoagulation coverage 5. FEN  Continue a diabetic diet  Encourage bowel regimen 6. Pain  Oral narcotics and Tylenol 7. Disposition   2-D echocardiogram is pending  Skilled nursing facility search is ongoing  Await clearance from neurology to allow patient to go to skilled nursing facility.  Would like to see the patient back in the office on Monday or Wednesday of next week however if the patient remains in the hospital will reassess his skin on Monday as he will likely be ready for surgery soon thereafter  Mearl Latin, PA-C 03/13/2011, 9:03 AM

## 2011-03-13 NOTE — Plan of Care (Signed)
Problem: Discharge Progression Outcomes Goal: Pain controlled with appropriate interventions Outcome: Progressing Pt tolerated OT: OOB<>chair with pain rating 2 out 10

## 2011-03-13 NOTE — Progress Notes (Signed)
Occupational Therapy Evaluation Patient Details Name: Peter Nicholson MRN: [id number] DOB: 1941/03/30 Today's Date: 03/13/2011  OT Assessment/Plan OT Assessment/Plan Comments on Treatment Session: pt with elevated BP RN aware. Pt with no symptoms OT Plan: Discharge plan needs to be updated OT Frequency: Min 2X/week Follow Up Recommendations: Skilled nursing facility Equipment Recommended: Defer to next venue OT Goals Acute Rehab OT Goals OT Goal Formulation: With patient Time For Goal Achievement: 2 weeks ADL Goals Pt Will Perform Grooming: with modified independence;Sitting at sink;Supported ADL Goal: Grooming - Progress: Not addressed Pt Will Perform Upper Body Bathing: with modified independence;Sitting at sink ADL Goal: Upper Body Bathing - Progress: Not addressed Pt Will Perform Lower Body Bathing: with modified independence;Sitting at sink ADL Goal: Lower Body Bathing - Progress: Progressing toward goals Pt Will Perform Upper Body Dressing: with modified independence;Sitting, chair ADL Goal: Upper Body Dressing - Progress: Not addressed Pt Will Perform Lower Body Dressing: with modified independence;Sit to stand from chair;Supported;with adaptive equipment ADL Goal: Lower Body Dressing - Progress: Progressing toward goals Pt Will Transfer to Toilet: with modified independence;Stand pivot transfer;with DME;3-in-1 ADL Goal: Toilet Transfer - Progress: Progressing toward goals Pt Will Perform Toileting - Clothing Manipulation: with modified independence;Sitting on 3-in-1 or toilet ADL Goal: Toileting - Clothing Manipulation - Progress: Not addressed Pt Will Perform Toileting - Hygiene: with modified independence;Sit to stand from 3-in-1/toilet ADL Goal: Toileting - Hygiene - Progress: Not addressed Miscellaneous OT Goals Miscellaneous OT Goal #1: pt will perform bed mobility without rails/trapeze bar MIn A with HOB 20 degrees or less as precursor to ADLS OT Goal: Miscellaneous  Goal #1 - Progress: Progressing toward goals  OT Treatment Precautions/Restrictions  Precautions Precautions: Fall Restrictions Weight Bearing Restrictions: Yes RLE Weight Bearing: Non weight bearing   ADL ADL Lower Body Dressing: Performed;+1 Total assistance Where Assessed - Lower Body Dressing: Supine, head of bed up (don sock and shoes) Equipment Used: Rolling walker Mobility  Bed Mobility Bed Mobility: Yes Supine to Sit: 3: Mod assist;With rails;HOB elevated (Comment degrees) (35) Supine to Sit Details (indicate cue type and reason): using trapeze and LLE knee flexion to boost buttock off surface Sitting - Scoot to Edge of Bed: 4: Min assist;With rail Transfers Transfers: Yes Sit to Stand: 3: Mod assist;With upper extremity assist;From bed (bed elevated) Stand to Sit: 3: Mod assist;With upper extremity assist;To chair/3-in-1 Exercises    End of Session OT - End of Session Activity Tolerance: Patient tolerated treatment well Patient left: in chair;with call bell in reach Nurse Communication: Mobility status for ambulation;Mobility status for transfers General Behavior During Session: [facility] for tasks performed Cognition: [facility] for tasks performed  MONITOR BP closely throughout session. Pt with bowel sounds, not passing gas during session and only void bladder with Claudie Leach  03/13/2011, 10:45 AM Pager: [phone]

## 2011-03-13 NOTE — Plan of Care (Signed)
Problem: Phase III Progression Outcomes Goal: Dressing/splint clean, dry, intact Outcome: Progressing Pin site care completed

## 2011-03-13 NOTE — Progress Notes (Signed)
CSW received consult for SNF and met with pt to address consult. For full assessment, please see pt's chart. CSW will continue to follow.   Dede Query, MSW, Theresia Majors [phone]

## 2011-03-14 ENCOUNTER — Inpatient Hospital Stay (HOSPITAL_COMMUNITY): Payer: Medicare Other

## 2011-03-14 LAB — GLUCOSE, CAPILLARY
Glucose-Capillary: 131 mg/dL — ABNORMAL HIGH (ref 70–99)
Glucose-Capillary: 134 mg/dL — ABNORMAL HIGH (ref 70–99)
Glucose-Capillary: 61 mg/dL — ABNORMAL LOW (ref 70–99)
Glucose-Capillary: 91 mg/dL (ref 70–99)

## 2011-03-14 LAB — LIPID PANEL: LDL Cholesterol: 43 mg/dL (ref 0–99)

## 2011-03-14 LAB — CARDIAC PANEL(CRET KIN+CKTOT+MB+TROPI)
CK, MB: 5.6 ng/mL — ABNORMAL HIGH (ref 0.3–4.0)
Relative Index: 0.2 (ref 0.0–2.5)
Total CK: 3120 U/L — ABNORMAL HIGH (ref 7–232)
Troponin I: <0.3 ng/mL (ref <0.30)

## 2011-03-14 MED ORDER — DEXTROSE 50 % IV SOLN
INTRAVENOUS | Status: AC
  Administered 2011-03-14: 25 mL
  Filled 2011-03-14: qty 50

## 2011-03-14 MED ORDER — PANTOPRAZOLE SODIUM 40 MG IV SOLR
40.0000 mg | Freq: Every day | INTRAVENOUS | Status: DC
  Administered 2011-03-15 – 2011-03-16 (×2): 40 mg via INTRAVENOUS
  Filled 2011-03-14 (×3): qty 40

## 2011-03-14 MED ORDER — GLUCAGON HCL (RDNA) 1 MG IJ SOLR
INTRAMUSCULAR | Status: AC
  Filled 2011-03-14: qty 1

## 2011-03-14 MED ORDER — INSULIN ASPART 100 UNIT/ML ~~LOC~~ SOLN
0.0000 [IU] | SUBCUTANEOUS | Status: DC
  Administered 2011-03-15: 0 [IU] via SUBCUTANEOUS
  Administered 2011-03-15 (×2): 2 [IU] via SUBCUTANEOUS
  Administered 2011-03-16 (×2): 3 [IU] via SUBCUTANEOUS
  Administered 2011-03-16: 2 [IU] via SUBCUTANEOUS
  Administered 2011-03-16 – 2011-03-17 (×2): 3 [IU] via SUBCUTANEOUS
  Administered 2011-03-17: 2 [IU] via SUBCUTANEOUS
  Administered 2011-03-17: 3 [IU] via SUBCUTANEOUS
  Administered 2011-03-17 (×2): 2 [IU] via SUBCUTANEOUS
  Administered 2011-03-18: 3 [IU] via SUBCUTANEOUS
  Administered 2011-03-18: 2 [IU] via SUBCUTANEOUS
  Administered 2011-03-18: 3 [IU] via SUBCUTANEOUS
  Administered 2011-03-18: 2 [IU] via SUBCUTANEOUS
  Administered 2011-03-18 – 2011-03-19 (×3): 3 [IU] via SUBCUTANEOUS
  Administered 2011-03-19: 2 [IU] via SUBCUTANEOUS
  Administered 2011-03-19: 5 [IU] via SUBCUTANEOUS
  Administered 2011-03-19: 3 [IU] via SUBCUTANEOUS
  Administered 2011-03-19: 2 [IU] via SUBCUTANEOUS
  Administered 2011-03-20 (×4): 3 [IU] via SUBCUTANEOUS
  Administered 2011-03-20: 2 [IU] via SUBCUTANEOUS
  Administered 2011-03-21: 5 [IU] via SUBCUTANEOUS
  Administered 2011-03-21 (×2): 3 [IU] via SUBCUTANEOUS
  Administered 2011-03-21: 5 [IU] via SUBCUTANEOUS
  Administered 2011-03-21: 11 [IU] via SUBCUTANEOUS
  Administered 2011-03-22: 3 [IU] via SUBCUTANEOUS
  Administered 2011-03-22 (×3): 5 [IU] via SUBCUTANEOUS
  Administered 2011-03-22: 3 [IU] via SUBCUTANEOUS
  Administered 2011-03-22: 5 [IU] via SUBCUTANEOUS
  Administered 2011-03-23: 3 [IU] via SUBCUTANEOUS
  Administered 2011-03-23: 8 [IU] via SUBCUTANEOUS
  Administered 2011-03-23: 5 [IU] via SUBCUTANEOUS
  Administered 2011-03-23: 2 [IU] via SUBCUTANEOUS
  Administered 2011-03-23: 5 [IU] via SUBCUTANEOUS
  Administered 2011-03-24 (×2): 3 [IU] via SUBCUTANEOUS
  Administered 2011-03-24: 2 [IU] via SUBCUTANEOUS
  Administered 2011-03-24 (×2): 3 [IU] via SUBCUTANEOUS
  Administered 2011-03-25: 2 [IU] via SUBCUTANEOUS
  Administered 2011-03-25 (×2): 3 [IU] via SUBCUTANEOUS
  Administered 2011-03-25 – 2011-03-26 (×4): 5 [IU] via SUBCUTANEOUS
  Administered 2011-03-26: 8 [IU] via SUBCUTANEOUS
  Administered 2011-03-26 (×2): 2 [IU] via SUBCUTANEOUS
  Administered 2011-03-26: 5 [IU] via SUBCUTANEOUS
  Administered 2011-03-27 (×4): 3 [IU] via SUBCUTANEOUS
  Filled 2011-03-14: qty 3

## 2011-03-14 MED ORDER — DEXTROSE-NACL 5-0.45 % IV SOLN
INTRAVENOUS | Status: DC
  Administered 2011-03-14 – 2011-03-24 (×11): via INTRAVENOUS

## 2011-03-14 NOTE — Progress Notes (Signed)
Subjective: Right lower ext doing well.  C/o abd discomfort and bloating. Last bowel movement Monday.  Minimal flatus.  Poor appetite. Objective: Vital signs in last 24 hours: Temp:  [98.5 F (36.9 C)-99.7 F (37.6 C)] 99.5 F (37.5 C) (12/01 0505) Pulse Rate:  [103-120] 120  (12/01 0505) Resp:  [16-18] 16  (12/01 0505) BP: (158-180)/(72-84) 161/84 mmHg (12/01 0505) SpO2:  [96 %-97 %] 96 % (12/01 0505)  Intake/Output from previous day: 11/30 0701 - 12/01 0700 In: 600 [P.O.:600] Out: 1200 [Urine:1200] Intake/Output this shift:     Basename 03/13/11 0707 03/12/11 0605  HGB 10.8* 10.6*    Basename 03/13/11 0707 03/12/11 0605  WBC 9.9 7.9  RBC 3.33* 3.30*  HCT 33.1* 33.1*  PLT 195 158    Basename 03/13/11 0707  NA 139  K 4.3  CL 106  CO2 24  BUN 13  CREATININE 1.10  GLUCOSE 111*  CALCIUM 8.6   No results found for this basename: LABPT:2,INR:2 in the last 72 hours  Exam: Right le dressing c/d/i.  Moves toes well.  Sensation intact.  Abd very distended and tender to palpation. Large scar from previous abd surgery.    Assessment/Plan: Stat kub this am.    Continue present care.   Toneshia Coello,Torris M 03/14/2011, 12:13 PM

## 2011-03-14 NOTE — Progress Notes (Signed)
Stroke Team Progress Note  SUBJECTIVE [name] is a 71 y.o. male who is stable without any new neurovascular symptoms. OBJECTIVE Most recent Vital Signs: Temp: 99.5 F (37.5 C) (12/01 0505) BP: 161/84 mmHg (12/01 0505) Pulse Rate: 120  (12/01 0505) Respiratory Rate: 16 O2 Saturdation: 96%  CBG (last 3)   Basename 03/14/11 1117 03/14/11 0718 03/13/11 2342  GLUCAP 134* 131* 91   Intake/Output from previous day: 11/30 0701 - 12/01 0700 In: 600 [P.O.:600] Out: 1200 [Urine:1200]  IV Fluid Intake:     . 0.9 % NaCl with KCl 20 mEq / L 20 mL/hr at 03/13/11 1610   Diet: Carb Control   Activity:bedrest  DVT Prophylaxis: lovenox  Studies: Results for orders placed during the hospital encounter of 03/09/11 (from the past 24 hour(s))  GLUCOSE, CAPILLARY     Status: Normal   Collection Time   03/13/11  4:32 PM      Component Value Range   Glucose-Capillary 84  70 - 99 (mg/dL)   Comment 1 Documented in Chart     Comment 2 Notify RN    GLUCOSE, CAPILLARY     Status: Abnormal   Collection Time   03/13/11  9:35 PM      Component Value Range   Glucose-Capillary 61 (*) 70 - 99 (mg/dL)  GLUCOSE, CAPILLARY     Status: Abnormal   Collection Time   03/13/11 10:32 PM      Component Value Range   Glucose-Capillary 61 (*) 70 - 99 (mg/dL)  GLUCOSE, CAPILLARY     Status: Normal   Collection Time   03/13/11 11:42 PM      Component Value Range   Glucose-Capillary 91  70 - 99 (mg/dL)  GLUCOSE, CAPILLARY     Status: Abnormal   Collection Time   03/14/11  7:18 AM      Component Value Range   Glucose-Capillary 131 (*) 70 - 99 (mg/dL)  GLUCOSE, CAPILLARY     Status: Abnormal   Collection Time   03/14/11 11:17 AM      Component Value Range   Glucose-Capillary 134 (*) 70 - 99 (mg/dL)   Comment 1 Notify RN       Ct Angio Head W/cm &/or Wo Cm  03/13/2011  *RADIOLOGY REPORT*  Clinical Data:  70 year old male with changes in speech and cognitive ability.  Possible stroke.  CT  ANGIOGRAPHY HEAD AND NECK  Technique:  Multidetector CT imaging of the head and neck was performed using the standard protocol during bolus administration of intravenous contrast.  Multiplanar CT image reconstructions including MIPs were obtained to evaluate the vascular anatomy. Carotid stenosis measurements (when applicable) are obtained utilizing NASCET criteria, using the distal internal carotid diameter as the denominator.  Contrast: OMNIPAQUE IOHEXOL 350 MG/ML IV SOLN,  Comparison:  MRI brain, MRA head and MRA neck 03/15/2009.  CTA NECK  Findings:  Dependent atelectasis and some peripheral pulmonary scarring is seen in the lung apices.  No superior mediastinal lymphadenopathy.  Mediastinal lipomatosis.  Negative thyroid, larynx and nasopharynx.  Retained secretions in the right hypopharynx.  Oropharynx within normal limits.  Parapharyngeal spaces, retropharyngeal space, sublingual space, submandibular gland, parotid glands, and orbits are within normal limits.  Previous paranasal sinus surgery.  Low density fluid level layering in the right sphenoid.  Mild paranasal sinus mucosal thickening elsewhere.  Mastoids and tympanic cavities are clear.  No lymphadenopathy.  Severe degenerative changes in the spine. Incidental torus mandibularis. No acute osseous abnormality  identified.  Vascular Findings: Small superficial venous collaterals are opacified in the anterior right chest wall and about the right shoulder.  No subclavian or innominate vein stenosis is identified on the right.  Three-vessel arch configuration with mild arch atherosclerosis. Less than 50% stenosis of the great vessel origins with respect to the distal vessel caliber.  Negative right common carotid artery to the bifurcation.  At the bifurcation circumferential soft plaque and bulky medial calcified plaque occurs.  Subsequent proximal right ICA stenosis is less than 50 % with respect to the distal vessel.  Negative cervical right ICA  otherwise.   Diminutive right vertebral artery is patent and shows areas of tortuosity and irregularity in the neck.  At the right skull base as the vessel penetrate the dura a small patent right PICA branches noted, beyond this level the right vertebral is occluded (series 7 image 109).  Dominant left vertebral artery, with a patent origin and less than 50% origin stenosis.  Tortuous proximal left vertebral artery. Tortuosity of the left V2 vertebral segment is maximal at the C5-C6 level.  It is patent to the skull base, but the left V4 segment is abnormal, see below.  Mildly tortuous left common carotid artery.  Sequelae of left carotid endarterectomy with widely patent left carotid bifurcation and left ICA origin.  Tortuous left cervical ICA without stenosis.   Review of the MIP images confirms the above findings.  IMPRESSION: 1.  Prior left carotid endarterectomy with no left carotid stenosis in the neck. 2. Relatively mild right carotid atherosclerosis without hemodynamically significant stenosis in the neck. 3.  Diminutive right vertebral artery is patent in the neck but occludes at the skull base just beyond the right PICA origin. 4.  Dominant left vertebral artery is patent in the neck but also abnormal at the skull base, see below.  CTA HEAD  Findings:  Stable cerebral volume.  No ventriculomegaly. No midline shift, mass effect, or evidence of mass lesion.  No acute intracranial hemorrhage identified.  Chronic right cerebellar lacunar infarcts appear similar to the prior MRI.  Patchy supratentorial subcortical white matter hypodensity may be increased. No evidence of cortically based acute infarction identified.  No abnormal enhancement identified. Calvarium intact. Visualized scalp soft tissues are within normal limits.  Vascular Findings: Major dural sinuses are enhancing.  Extensive calcified atherosclerosis of the bilateral distal vertebral arteries in the V4 segment.  The distal right vertebral artery  appears occluded.  The distal left vertebral artery remains patent, but with high-grade stenosis.  The left vertebrobasilar junction is affected by atherosclerosis but remains patent.  There is no more distal atherosclerosis of the basilar artery.  Bilateral AICA vessels are patent. Superior cerebellar artery origins are patent.  Irregularity occurs at both PCA origins, worse on the right. Moderate left P1 segment and severe right P1 segment stenosis occur.  PCA branches are patent, but flow in the right PCA appears attenuated.  The posterior communicating arteries are diminutive or absent.  Both proximal ICA siphons are normal.  Both cavernous ICA segments are heavily calcified and moderate to high-grade bilateral distal cavernous ICA stenosis is suspected.  The supraclinoid segments are patent.  The carotid termini are patent.  The right ACA A1 segment is dominant.  Normal anterior communicating artery.  Mild irregularity of the ACA branches without significant stenosis or major branch occlusion.  Bilateral M1 MCA segment are patent with mild irregularity. Bilateral MCA bifurcations are patent.  Bilateral MCA branches are only mildly irregular without  major branch occlusion or stenosis.   Review of the MIP images confirms the above findings.  IMPRESSION: 1. Anterior circulation: - Moderate to severe bilateral cavernous ICA stenosis due to extensive calcified atherosclerosis. 2. Posterior circulation: - Severe distal vertebral artery atherosclerosis.  The distal right vertebral is occluded.  There is chronic high-grade stenosis of the distal left vertebral artery. -  Severe right and moderate left proximal PCA stenoses occur with preserved distal branch flow. 3.  Chronic cerebellar infarcts, predominately on the right. 4. No acute cortically based infarction identified.  Suspect increased nonspecific cerebral white matter changes since 2010.  Original Report Authenticated By: Harley Hallmark, [name]   Ct Angio Neck  W/cm &/or Wo/cm  03/13/2011  *RADIOLOGY REPORT*  Clinical Data:  70 year old male with changes in speech and cognitive ability.  Possible stroke.  CT ANGIOGRAPHY HEAD AND NECK  Technique:  Multidetector CT imaging of the head and neck was performed using the standard protocol during bolus administration of intravenous contrast.  Multiplanar CT image reconstructions including MIPs were obtained to evaluate the vascular anatomy. Carotid stenosis measurements (when applicable) are obtained utilizing NASCET criteria, using the distal internal carotid diameter as the denominator.  Contrast: OMNIPAQUE IOHEXOL 350 MG/ML IV SOLN,  Comparison:  MRI brain, MRA head and MRA neck 03/15/2009.  CTA NECK  Findings:  Dependent atelectasis and some peripheral pulmonary scarring is seen in the lung apices.  No superior mediastinal lymphadenopathy.  Mediastinal lipomatosis.  Negative thyroid, larynx and nasopharynx.  Retained secretions in the right hypopharynx.  Oropharynx within normal limits.  Parapharyngeal spaces, retropharyngeal space, sublingual space, submandibular gland, parotid glands, and orbits are within normal limits.  Previous paranasal sinus surgery.  Low density fluid level layering in the right sphenoid.  Mild paranasal sinus mucosal thickening elsewhere.  Mastoids and tympanic cavities are clear.  No lymphadenopathy.  Severe degenerative changes in the spine. Incidental torus mandibularis. No acute osseous abnormality identified.  Vascular Findings: Small superficial venous collaterals are opacified in the anterior right chest wall and about the right shoulder.  No subclavian or innominate vein stenosis is identified on the right.  Three-vessel arch configuration with mild arch atherosclerosis. Less than 50% stenosis of the great vessel origins with respect to the distal vessel caliber.  Negative right common carotid artery to the bifurcation.  At the bifurcation circumferential soft plaque and bulky medial  calcified plaque occurs.  Subsequent proximal right ICA stenosis is less than 50 % with respect to the distal vessel.  Negative cervical right ICA otherwise.   Diminutive right vertebral artery is patent and shows areas of tortuosity and irregularity in the neck.  At the right skull base as the vessel penetrate the dura a small patent right PICA branches noted, beyond this level the right vertebral is occluded (series 7 image 109).  Dominant left vertebral artery, with a patent origin and less than 50% origin stenosis.  Tortuous proximal left vertebral artery. Tortuosity of the left V2 vertebral segment is maximal at the C5-C6 level.  It is patent to the skull base, but the left V4 segment is abnormal, see below.  Mildly tortuous left common carotid artery.  Sequelae of left carotid endarterectomy with widely patent left carotid bifurcation and left ICA origin.  Tortuous left cervical ICA without stenosis.   Review of the MIP images confirms the above findings.  IMPRESSION: 1.  Prior left carotid endarterectomy with no left carotid stenosis in the neck. 2. Relatively mild right carotid atherosclerosis without  hemodynamically significant stenosis in the neck. 3.  Diminutive right vertebral artery is patent in the neck but occludes at the skull base just beyond the right PICA origin. 4.  Dominant left vertebral artery is patent in the neck but also abnormal at the skull base, see below.  CTA HEAD  Findings:  Stable cerebral volume.  No ventriculomegaly. No midline shift, mass effect, or evidence of mass lesion.  No acute intracranial hemorrhage identified.  Chronic right cerebellar lacunar infarcts appear similar to the prior MRI.  Patchy supratentorial subcortical white matter hypodensity may be increased. No evidence of cortically based acute infarction identified.  No abnormal enhancement identified. Calvarium intact. Visualized scalp soft tissues are within normal limits.  Vascular Findings: Major dural sinuses  are enhancing.  Extensive calcified atherosclerosis of the bilateral distal vertebral arteries in the V4 segment.  The distal right vertebral artery appears occluded.  The distal left vertebral artery remains patent, but with high-grade stenosis.  The left vertebrobasilar junction is affected by atherosclerosis but remains patent.  There is no more distal atherosclerosis of the basilar artery.  Bilateral AICA vessels are patent. Superior cerebellar artery origins are patent.  Irregularity occurs at both PCA origins, worse on the right. Moderate left P1 segment and severe right P1 segment stenosis occur.  PCA branches are patent, but flow in the right PCA appears attenuated.  The posterior communicating arteries are diminutive or absent.  Both proximal ICA siphons are normal.  Both cavernous ICA segments are heavily calcified and moderate to high-grade bilateral distal cavernous ICA stenosis is suspected.  The supraclinoid segments are patent.  The carotid termini are patent.  The right ACA A1 segment is dominant.  Normal anterior communicating artery.  Mild irregularity of the ACA branches without significant stenosis or major branch occlusion.  Bilateral M1 MCA segment are patent with mild irregularity. Bilateral MCA bifurcations are patent.  Bilateral MCA branches are only mildly irregular without major branch occlusion or stenosis.   Review of the MIP images confirms the above findings.  IMPRESSION: 1. Anterior circulation: - Moderate to severe bilateral cavernous ICA stenosis due to extensive calcified atherosclerosis. 2. Posterior circulation: - Severe distal vertebral artery atherosclerosis.  The distal right vertebral is occluded.  There is chronic high-grade stenosis of the distal left vertebral artery. -  Severe right and moderate left proximal PCA stenoses occur with preserved distal branch flow. 3.  Chronic cerebellar infarcts, predominately on the right. 4. No acute cortically based infarction  identified.  Suspect increased nonspecific cerebral white matter changes since 2010.  Original Report Authenticated By: Harley Hallmark, [name]    Physical Exam:   Patient alert and oriented x 3. Speech clear. No aphasia. No dysarthria. Extraoccular movements intact. Visual fields full. Face symmetric. Tongue midline. Moves all extremities x 4, RLE in cast with pins limit evaluation but moves toes and ankle well.. Strength normal. Coordination normal. Sensation intact. Heart rate regular. Breath sounds clear.  ASSESSMENT [name] is a 70 y.o. male with a left hemispheric TIA after plavix was being held for back steroid injection. He is on aspirin 81 mg orally every day for secondary stroke prevention following leg fracture from fall at home. Was on plavix prior to admission. Had TIA sx once off Plavix x 5 days in preparation for injection for back pain  Stroke risk factors:  diabetes mellitus, hyperlipidemia, hypertension and OSA, prior stroke   Hospital day # 5  TREATMENT/PLAN  Continue aspirin 81 mg orally  every day for secondary stroke prevention. Ok for leg surgery next week. Recommend holdoing on back injection as typically ASA is held and we do not recommended holding ASA at this time due to stroke risk. Resume plavix post op when able.D/w patient and stroke team will sign off. Gates Rigg, MD [facility] Pager: [phone] 03/14/2011 1:47 PM

## 2011-03-14 NOTE — Progress Notes (Signed)
Patient ID: Peter Nicholson, male   DOB: Mar 10, 1941, 70 y.o.   MRN: [id number] I reviewed KUB.  Showed generalized small bowel and colonic ileus.  Spoke with GI on call and they will see him today.

## 2011-03-14 NOTE — Consult Note (Addendum)
Peter Nicholson MRN: [id number] DOB/AGE: [birth year] 71 y.o. Primary Care Physician:NADEL,SCOTT M, MD, MD Admit date: 03/09/2011     primary care provider Dr. Lorin Picket NADEL Chief Complaint: Medical management HPI: 70 year-old male  who fell 6 feet off a ladder while cleaning  gutters sustaining an impacted bicondylar tibial plateau fracture.  He underwent Spanning external fixation of right tibial plateau with closed reduction on the 27th. On the 29th the patient was found to have an episode, and he had garbled speech on the 29th, lasting 5-10 minutes. The episode was self-limiting. The patient was thought to have a left hemispheric TIA, and was recommended to continue his aspirin. His Plavix has been on hold, secondary to anticipated surgery next week.A CBG evaluation was 89 around the time of the event. The patient was sent down for a CT scan of the brain that shows evidence of an old right cerebellar infarct, without acute changes seen. A CT angiogram of the head and neck was done and this reportedly was unremarkable. Neurology was asked to see this patient further evaluation. The patient had been taken off of Plavix prior to admission, but aspirin has now been added back to the regimen. NIH stroke scale score was 0. The patient was not a TPA candidate secondary to rapid improvement in deficit, and recent surgery on the right leg. The patient is not a candidate for MRI evaluation secondary to metal pins in the right leg.  Today the patient was noted to have abdominal pain associated with some nausea and vomiting, for which he was evaluated by Dr. Bosie Clos. Dr. Bosie Clos has ordered a CT of the abdomen and pelvis is pending at this time. He suspects bowel obstruction VS adynamic ileus .He likely has scar tissue/adhesions based on his 2 intestinal blockages in 1994 that required surgery and his history of kidney surgery.      Past Medical History  Diagnosis Date  . Obstructive sleep apnea (adult)  (pediatric)   . Unspecified essential hypertension   . Cerebrovascular disease, unspecified   . Unspecified venous (peripheral) insufficiency   . Pure hypercholesterolemia   . Type II or unspecified type diabetes mellitus without mention of complication, not stated as uncontrolled   . Overweight   . Diaphragmatic hernia without mention of obstruction or gangrene   . Esophageal reflux   . Diverticulosis of colon (without mention of hemorrhage)   . Other specified congenital anomaly of kidney   . Malignant neoplasm of kidney, except pelvis   . Calculus of kidney   . Osteoarthrosis, unspecified whether generalized or localized, unspecified site   . Gout, unspecified   . Lumbago   . Anxiety state, unspecified   . Stroke     Past Surgical History  Procedure Date  . Incisional hernia repair 1/96    Dr.Leone  . Nasal sinus surgery 12/96    Dr.Redman  . Cholecystectomy 9/04    Dr. Maryagnes Amos  . Partial nephrectomy 1994    renal cell cancer in horseshoe kidney  . Carotid endarterectomy 09/2000    Dr.Lawson  . Veterbral art angioplasty     x2  . Small intestine surgery   . External fixation leg 03/10/2011    Procedure: EXTERNAL FIXATION LEG;  Surgeon: Budd Palmer;  Location: [facility] OR;  Service: Orthopedics;  Laterality: Right;    Prior to Admission medications   Medication Sig Start Date End Date Taking? Authorizing Provider  allopurinol (ZYLOPRIM) 300 MG tablet TAKE ONE TABLET BY MOUTH EVERY DAY  02/08/11 02/11/12 Yes Michele Mcalpine, MD  ALPRAZolam Prudy Feeler) 0.5 MG tablet Take 1/2 to 1 tablet by mouth three times daily as needed for nerves 09/25/10  Yes Michele Mcalpine, MD  aspirin 325 MG EC tablet Take 325 mg by mouth daily.     Yes Historical Provider, MD  docusate sodium (COLACE) 100 MG capsule Take 100 mg by mouth daily.     Yes Historical Provider, MD  glyBURIDE (DIABETA) 5 MG tablet Take 2 tablets by mouth two times daily 11/26/10  Yes Michele Mcalpine, MD  metFORMIN (GLUCOPHAGE) 500 MG  tablet TAKE TWO TABLETS BY MOUTH TWICE DAILY 01/18/11  Yes Michele Mcalpine, MD  Multiple Vitamin (MULTIVITAMIN) tablet Take 1 tablet by mouth daily.     Yes Historical Provider, MD  omeprazole (PRILOSEC OTC) 20 MG tablet Take 20 mg by mouth daily.     Yes Historical Provider, MD  polyethylene glycol powder (MIRALAX) powder Take 17 g by mouth 2 (two) times daily. Take one scoop in 8 ounces of water two times a day as needed.    Yes Historical Provider, MD  rosuvastatin (CRESTOR) 20 MG tablet Take 1 tablet (20 mg total) by mouth at bedtime. 10/09/10 10/09/11 Yes Michele Mcalpine, MD  saxagliptin HCl (ONGLYZA) 5 MG TABS tablet Take 1 tablet (5 mg total) by mouth daily. 10/09/10  Yes Michele Mcalpine, MD  acetaminophen (TYLENOL) 325 MG tablet Take 1-2 tablets (325-650 mg total) by mouth every 6 (six) hours as needed. 03/13/11 03/23/11  Mearl Latin, PA  enoxaparin (LOVENOX) 30 MG/0.3ML SOLN Inject 0.3 mLs (30 mg total) into the skin daily. 03/13/11   Mearl Latin, PA  hydrochlorothiazide (MICROZIDE) 12.5 MG capsule Take 1 capsule (12.5 mg total) by mouth daily. 03/13/11 03/12/12  Mearl Latin, PA  lisinopril (PRINIVIL,ZESTRIL) 5 MG tablet Take 1 tablet (5 mg total) by mouth daily. 03/13/11 03/12/12  Mearl Latin, PA  methocarbamol (ROBAXIN) 500 MG tablet Take 1-2 tablets (500-1,000 mg total) by mouth every 6 (six) hours as needed. 03/13/11 03/23/11  Mearl Latin, PA  oxyCODONE (OXY IR/ROXICODONE) 5 MG immediate release tablet Take 1-3 tablets (5-15 mg total) by mouth every 3 (three) hours as needed for pain. 03/13/11 03/23/11  Mearl Latin, PA  oxyCODONE-acetaminophen (PERCOCET) 5-325 MG per tablet Take 1-2 tablets by mouth every 6 (six) hours as needed for pain. 03/13/11 03/23/11  Mearl Latin, PA  pioglitazone (ACTOS) 30 MG tablet Take 1 tablet (30 mg total) by mouth daily. 03/13/11 03/12/12  Mearl Latin, PA    Allergies:  Allergies  Allergen Reactions  . Hydromorphone Hcl     REACTION: hallucinations  .  Promethazine Hcl     REACTION: hallucinations    Family History  Problem Relation Age of Onset  . Heart attack Father   . Cancer Brother   . Dementia Mother     Social History:  reports that he has never smoked. He has never used smokeless tobacco. He reports that he drinks alcohol. He reports that he does not use illicit drugs.   Family history reviewed and found to be noncontributory  ROS: A complete 14 point review of systems was done as documented in history of present illness full  PHYSICAL EXAM: Blood pressure 153/82, pulse 100, temperature 98.2 F (36.8 C), temperature source Oral, resp. rate 18, height 5\' 7"  (1.702 m), weight 93.26 kg (205 lb 9.6 oz), SpO2 96.00%.  Constitutional: He is oriented to  person, place, and time.  Obese  HENT:  Head: Normocephalic and atraumatic.  Eyes: Pupils are equal, round, and reactive to light.  Neck: Normal range of motion. Neck supple.  Cardiovascular: Normal rate and regular rhythm.  No murmur heard.  Pulmonary/Chest: Effort normal and breath sounds normal.  Abdominal: Soft. There is no tenderness. Hypoactive bowel sounds  Musculoskeletal:  Right knee swelling with tenderness. External fixator in place  2+ dorsalis pedis pulse and posterior tibial pulse on the right-hand side. No limb length difference  Neurological: He is alert and oriented to person, place, and time.  Skin: Skin is warm and dry.  Psychiatric: He has a normal mood and affect.    No results found for this or any previous visit (from the past 240 hour(s)).   Results for orders placed during the hospital encounter of 03/09/11 (from the past 48 hour(s))  GLUCOSE, CAPILLARY     Status: Abnormal   Collection Time   03/13/11  6:05 AM      Component Value Range Comment   Glucose-Capillary 106 (*) 70 - 99 (mg/dL)    Comment 1 Documented in Chart      Comment 2 Notify RN     CBC     Status: Abnormal   Collection Time   03/13/11  7:07 AM      Component Value Range  Comment   WBC 9.9  4.0 - 10.5 (K/uL)    RBC 3.33 (*) 4.22 - 5.81 (MIL/uL)    Hemoglobin 10.8 (*) 13.0 - 17.0 (g/dL)    HCT 16.1 (*) 09.6 - 52.0 (%)    MCV 99.4  78.0 - 100.0 (fL)    MCH 32.4  26.0 - 34.0 (pg)    MCHC 32.6  30.0 - 36.0 (g/dL)    RDW 04.5  40.9 - 81.1 (%)    Platelets 195  150 - 400 (K/uL)   BASIC METABOLIC PANEL     Status: Abnormal   Collection Time   03/13/11  7:07 AM      Component Value Range Comment   Sodium 139  135 - 145 (mEq/L)    Potassium 4.3  3.5 - 5.1 (mEq/L)    Chloride 106  96 - 112 (mEq/L)    CO2 24  19 - 32 (mEq/L)    Glucose, Bld 111 (*) 70 - 99 (mg/dL)    BUN 13  6 - 23 (mg/dL)    Creatinine, Ser 9.14  0.50 - 1.35 (mg/dL)    Calcium 8.6  8.4 - 10.5 (mg/dL)    GFR calc non Af Amer 66 (*) >90 (mL/min)    GFR calc Af Amer 76 (*) >90 (mL/min)   GLUCOSE, CAPILLARY     Status: Abnormal   Collection Time   03/13/11 11:25 AM      Component Value Range Comment   Glucose-Capillary 144 (*) 70 - 99 (mg/dL)    Comment 1 Documented in Chart      Comment 2 Notify RN     GLUCOSE, CAPILLARY     Status: Normal   Collection Time   03/13/11  4:32 PM      Component Value Range Comment   Glucose-Capillary 84  70 - 99 (mg/dL)    Comment 1 Documented in Chart      Comment 2 Notify RN     GLUCOSE, CAPILLARY     Status: Abnormal   Collection Time   03/13/11  9:35 PM      Component Value Range  Comment   Glucose-Capillary 61 (*) 70 - 99 (mg/dL)   GLUCOSE, CAPILLARY     Status: Abnormal   Collection Time   03/13/11 10:32 PM      Component Value Range Comment   Glucose-Capillary 61 (*) 70 - 99 (mg/dL)   GLUCOSE, CAPILLARY     Status: Normal   Collection Time   03/13/11 11:42 PM      Component Value Range Comment   Glucose-Capillary 91  70 - 99 (mg/dL)   GLUCOSE, CAPILLARY     Status: Abnormal   Collection Time   03/14/11  7:18 AM      Component Value Range Comment   Glucose-Capillary 131 (*) 70 - 99 (mg/dL)   GLUCOSE, CAPILLARY     Status: Abnormal    Collection Time   03/14/11 11:17 AM      Component Value Range Comment   Glucose-Capillary 134 (*) 70 - 99 (mg/dL)    Comment 1 Notify RN     GLUCOSE, CAPILLARY     Status: Normal   Collection Time   03/14/11  5:31 PM      Component Value Range Comment   Glucose-Capillary 70  70 - 99 (mg/dL)     Ct Angio Head W/cm &/or Wo Cm  03/13/2011  *   IMPRESSION: 1. Anterior circulation: - Moderate to severe bilateral cavernous ICA stenosis due to extensive calcified atherosclerosis. 2. Posterior circulation: - Severe distal vertebral artery atherosclerosis.  The distal right vertebral is occluded.  There is chronic high-grade stenosis of the distal left vertebral artery. -  Severe right and moderate left proximal PCA stenoses occur with preserved distal branch flow. 3.  Chronic cerebellar infarcts, predominately on the right. 4. No acute cortically based infarction identified.  Suspect increased nonspecific cerebral white matter changes since 2010.  Original Report Authenticated By: Harley Hallmark, [name]   Dg Chest 1 View  03/10/2011  *RADIOLOGY REPORT*  Clinical Data: Preoperative respiratory exam for right tibial fracture.  CHEST - 1 VIEW  Comparison: 08/06/2008  Findings: Some degree of underlying chronic lung disease is suspected.  No evidence of overt edema or infiltrate.  Heart is mildly enlarged.  The aorta is tortuous.  No pleural effusions.  IMPRESSION: Chronic lung disease and mild cardiomegaly.  Original Report Authenticated By: Reola Calkins, [name]   Dg Knee 1-2 Views Right  03/10/2011  *RADIOLOGY REPORT*  Clinical Data: Next fixed right tibial plateau fracture  RIGHT KNEE - 1-2 VIEW  Comparison: CT 03/10/1999  Findings:   Interval placement  of external fixation device spanning the knee joint.  Comminuted fracture of the tibial plateau noted.  IMPRESSION: External fixator placement for complex tibial plateau fracture.  Original Report Authenticated By: Genevive Bi, [name]   Dg Abd 1  View  03/14/2011  *RADIOLOGY REPORT*  Clinical Data: Evaluate for ileus.  ABDOMEN - 1 VIEW  Comparison: None.  Findings:   Generalized distention of small and vertically large bowel with distal rectal gas.  No visible free air on this recumbent film.  Elevated right and left hemidiaphragms.  Surgical clips on the right from previous nephrectomy.  Degenerative changes lumbar spine.  IMPRESSION: Findings consistent with generalized small bowel and colonic ileus.  Original Report Authenticated By: Elsie Stain, [name]   Ct Angio Neck W/cm &/or Wo/cm  03/13/2011  * IMPRESSION: 1. Anterior circulation: - Moderate to severe bilateral cavernous ICA stenosis due to extensive calcified atherosclerosis. 2. Posterior circulation: - Severe distal vertebral artery atherosclerosis.  The distal right vertebral is occluded.  There is chronic high-grade stenosis of the distal left vertebral artery. -  Severe right and moderate left proximal PCA stenoses occur with preserved distal branch flow. 3.  Chronic cerebellar infarcts, predominately on the right. 4. No acute cortically based infarction identified.  Suspect increased nonspecific cerebral white matter changes since 2010.  Original Report Authenticated By: Harley Hallmark, [name]   Ct Knee Right Wo Contrast  03/11/2011  *RADIOLOGY REPORT*  Clinical Data:  Fall from ladder.  Knee pain.  CT RIGHT KNEE WITHOUT CONTRAST  Technique:  Multidetector CT imaging of the right knee was performed according to the standard protocol without intravenous contrast. Multiplanar CT image reconstructions were also generated.  Comparison:  03/10/2011.  Findings:  Comminuted bicondylar tibial plateau fracture is present.  The there is a transverse fracture through the metaphysis compatible with Schatzker six fracture.  No definite meniscal entrapment.  Lipohemarthrosis.  Extensor mechanism intact. Downsloping of the posterior lateral tibial plateau.  Nondisplaced oblique fracture through the  fibular head and neck (image 31 series 602).  Fractures do extend along the distal medial collateral ligament attachment to the tibia.  Transverse tibial plateau fracture is non depressed.  Mild impaction. Femoral condyles appear intact.  IMPRESSION: Schatzker VI bicondylar tibial plateau fracture.  Nondisplaced fracture of the fibular head and neck.  3-DIMENSIONAL CT IMAGE RENDERING AT INDEPENDENT WORKSTATION:  Technique: 3-dimensional CT images were rendered by post-processing of the original CT data at independent workstation.  The 3- dimensional CT images were interpreted, and findings were reported in the accompanying complete CT report for this study.  Findings:  Schatzker six bicondylar tibial plateau fracture with lipohemarthrosis is well demonstrated on three-dimensional reconstructions.  IMPRESSION: As above.  Original Report Authenticated By: Andreas Newport, [name]   Ct Knee Right Wo Contrast  03/10/2011  *RADIOLOGY REPORT*  Clinical Data: Status post fall from roof, with injury to the right knee.  Comminuted tibial fracture noted on radiograph.  Request further evaluation on CT.  CT OF THE RIGHT KNEE WITHOUT CONTRAST  Technique:  Multidetector CT imaging was performed according to the standard protocol. Multiplanar CT image reconstructions were also generated.  Comparison: Right knee radiographs performed 03/09/2011  Findings: There is a significantly comminuted fracture through the proximal tibia, compatible with a complex Schatzker type V fracture, or a type C3 fracture in the OTA classification.  Two fracture lines are seen extending to the lateral tibial plateau, and there is significant comminution along the medial tibial plateau.  There is an oblique fracture line extending across the tibial spine, with mild displacement.  Fracture fragments are posteriorly displaced and slightly angulated.  There is fragmentation at the level of the tibiofibular articulation.  A nearly horizontal metaphyseal  fracture line is noted distally, without definite extension into the diaphysis.  The proximal fibula remains intact.  The distal femur also appears intact.  A moderate lipohemarthrosis is noted at the knee joint.  The medial and lateral menisci are not well characterized; there is mild lateral compartment widening.  The posterior cruciate ligament remains grossly intact; the anterior cruciate ligament is difficult to fully assess.  The medial collateral ligament is unremarkable in appearance.  There is disruption at the distal insertions of portions of the lateral collateral ligament complex.  The patellar tendon and quadriceps tendons are within normal limits.  Mild prepatellar soft tissue swelling is noted.  There is no evidence of significant disruption of the underlying vasculature.  Scattered soft tissue  edema is noted about the fracture site.  IMPRESSION:  1.  Significantly comminuted fracture through the proximal tibia, compatible with a complex Schatzker type V fracture, or a type C3 fracture in the OTA classification.  Nearly horizontal metaphyseal fracture line noted distally, without definite extension to the diaphysis. 2.  Moderate lipohemarthrosis at the knee joint. 3.  Anterior cruciate ligament not well assessed; disruption at the distal insertions of portions of the lateral collateral ligament complex.  Original Report Authenticated By: Tonia Ghent, [name]   Ct 3d Recon At Scanner  03/11/2011  *RADIOLOGY REPORT*  Clinical Data:  Fall from ladder.  Knee pain.  CT RIGHT KNEE WITHOUT CONTRAST  Technique:  Multidetector CT imaging of the right knee was performed according to the standard protocol without intravenous contrast. Multiplanar CT image reconstructions were also generated.  Comparison:  03/10/2011.  Findings:  Comminuted bicondylar tibial plateau fracture is present.  The there is a transverse fracture through the metaphysis compatible with Schatzker six fracture.  No definite meniscal  entrapment.  Lipohemarthrosis.  Extensor mechanism intact. Downsloping of the posterior lateral tibial plateau.  Nondisplaced oblique fracture through the fibular head and neck (image 31 series 602).  Fractures do extend along the distal medial collateral ligament attachment to the tibia.  Transverse tibial plateau fracture is non depressed.  Mild impaction. Femoral condyles appear intact.  IMPRESSION: Schatzker VI bicondylar tibial plateau fracture.  Nondisplaced fracture of the fibular head and neck.  3-DIMENSIONAL CT IMAGE RENDERING AT INDEPENDENT WORKSTATION:  Technique: 3-dimensional CT images were rendered by post-processing of the original CT data at independent workstation.  The 3- dimensional CT images were interpreted, and findings were reported in the accompanying complete CT report for this study.  Findings:  Schatzker six bicondylar tibial plateau fracture with lipohemarthrosis is well demonstrated on three-dimensional reconstructions.  IMPRESSION: As above.  Original Report Authenticated By: Andreas Newport, [name]   Dg Knee Complete 4 Views Right  03/09/2011  *RADIOLOGY REPORT*  Clinical Data: Fall from ladder with right knee injury.  RIGHT KNEE - COMPLETE 4+ VIEW  Comparison: None.  Findings: Comminuted fracture of the proximal tibia present demonstrating multiple planes and mild displacement.  Fracture plane does extend up into the central joint at the level of the tibial spines.  The distal femur and proximal fibula appear intact. Cross-table lateral view does show a lipohemarthrosis with elongated fluid level present.  IMPRESSION: Comminuted proximal tibial fracture with involvement of the central tibial plateau.  Associated lipohemarthrosis.  Original Report Authenticated By: Reola Calkins, [name]   Dg Knee Right Port  03/10/2011  *RADIOLOGY REPORT*  Clinical Data: External fixator placement.  PORTABLE RIGHT KNEE - 1-2 VIEW  Comparison: Intraoperative films, same date.  Findings: An  external fixator is in place.  Slight improved position and alignment of the complex comminuted tibial plateau fractures.  IMPRESSION: External fixator in place with improved position and alignment of the tibial fractures.  Original Report Authenticated By: P. Loralie Champagne, [name]   Dg Ankle Right Port  03/10/2011  *RADIOLOGY REPORT*  Clinical Data: Proximal tibia fracture.  Ankle swelling and tenderness medially.  PORTABLE RIGHT ANKLE - 2 VIEW  Comparison: Right knee radiographs 03/09/2011  Findings: The ankle is located.  No acute fracture is identified. There is diffuse subcutaneous swelling, most prominent adjacent to the lateral malleolus.  Peripheral vascular calcifications are noted posteriorly.  IMPRESSION:  1.  Soft tissue swelling diffusely, and most prominent laterally. 2.  No acute bony abnormality identified.  Original Report Authenticated By: Britta Mccreedy, [name]   Dg C-arm 1-60 Min  03/10/2011  CLINICAL DATA: x-fix tibial plateau fracture   C-ARM 1-60 MINUTES  Fluoroscopy was utilized by the requesting physician.  No radiographic  interpretation.      Impression:     #1 Closed fracture of tibial plateau , per  orthopedics    #2 DIABETES MELLITUS currently the patient is on dextrose because of his low CBGs. He is n.p.o. We will discontinue his glyburide, metformin, Actos and linagliptin and monitor her CBGs q. 4 hours. Continue sliding scale insulin. Hemoglobin A1c of 7.2.  #3 abdominal pain concern for small bowel obstruction, the patient has been seen by Dr. Bosie Clos, CT scan is pending. Consider CCS consult.  #4 hypertension patient to continue with when necessary hydralazine, scheduled HCTZ and lisinopril.  #5 TIA status post neurology consultation, they recommend to continue with aspirin and continue to hold the Plavix. Cardiac enzymes. Check lipid panel. EKG shows sinus tachycardia. 2-D echo is pending .   #6 HYPERCHOLESTEROLEMIA, hold Crestor until GI issues are  resolved     #7 VENOUS INSUFFICIENCY stable  #8 GERD continue PPI  We will continue to follow the patient closely.         Izayiah Tibbitts 03/14/2011, 9:57 PM

## 2011-03-14 NOTE — Progress Notes (Signed)
Patient ID: Peter Nicholson, male   DOB: 1940-10-16, 70 y.o.   MRN: [id number] Pt seen by Dr. Bosie Clos (GI).  He ordered npo status and ct abdomen.  Pt has multiple medical issues.I called hospitalist consult and they will see him tonight.

## 2011-03-14 NOTE — Consult Note (Signed)
Referring Provider: Dr. Eulah Pont Primary Care Physician:  Michele Mcalpine, MD, MD Primary Gastroenterologist:  Gentry Fitz  Reason for Consultation:  Abdominal pain, Ileus  HPI: Peter Nicholson is a 70 y.o. male s/p right lower leg surgery from a fracture in a fall who has been having progressive abdominal distention and abdominal pain since surgery. Denies any N/V. He reports having "twisting" of his intestines requiring surgery X 2 in 1994 following a kidney surgery. Denies any resection of his intestines being done at that time. Denies any abdominal pain like he is having now in the years since 1994. Abd Xray (1 view) shows distended small and large bowel concerning for an ileus.  Past Medical History  Diagnosis Date  . Obstructive sleep apnea (adult) (pediatric)   . Unspecified essential hypertension   . Cerebrovascular disease, unspecified   . Unspecified venous (peripheral) insufficiency   . Pure hypercholesterolemia   . Type II or unspecified type diabetes mellitus without mention of complication, not stated as uncontrolled   . Overweight   . Diaphragmatic hernia without mention of obstruction or gangrene   . Esophageal reflux   . Diverticulosis of colon (without mention of hemorrhage)   . Other specified congenital anomaly of kidney   . Malignant neoplasm of kidney, except pelvis   . Calculus of kidney   . Osteoarthrosis, unspecified whether generalized or localized, unspecified site   . Gout, unspecified   . Lumbago   . Anxiety state, unspecified   . Stroke     Past Surgical History  Procedure Date  . Incisional hernia repair 1/96    Dr.Leone  . Nasal sinus surgery 12/96    Dr.Redman  . Cholecystectomy 9/04    Dr. Maryagnes Amos  . Partial nephrectomy 1994    renal cell cancer in horseshoe kidney  . Carotid endarterectomy 09/2000    Dr.Lawson  . Veterbral art angioplasty     x2  . Small intestine surgery   . External fixation leg 03/10/2011    Procedure: EXTERNAL FIXATION  LEG;  Surgeon: Budd Palmer;  Location: [facility] OR;  Service: Orthopedics;  Laterality: Right;    Prior to Admission medications   Medication Sig Start Date End Date Taking? Authorizing Provider  allopurinol (ZYLOPRIM) 300 MG tablet TAKE ONE TABLET BY MOUTH EVERY DAY 02/08/11 02/11/12 Yes Scott Elayne Snare, MD  ALPRAZolam Prudy Feeler) 0.5 MG tablet Take 1/2 to 1 tablet by mouth three times daily as needed for nerves 09/25/10  Yes Michele Mcalpine, MD  aspirin 325 MG EC tablet Take 325 mg by mouth daily.     Yes Historical Provider, MD  docusate sodium (COLACE) 100 MG capsule Take 100 mg by mouth daily.     Yes Historical Provider, MD  glyBURIDE (DIABETA) 5 MG tablet Take 2 tablets by mouth two times daily 11/26/10  Yes Michele Mcalpine, MD  metFORMIN (GLUCOPHAGE) 500 MG tablet TAKE TWO TABLETS BY MOUTH TWICE DAILY 01/18/11  Yes Michele Mcalpine, MD  Multiple Vitamin (MULTIVITAMIN) tablet Take 1 tablet by mouth daily.     Yes Historical Provider, MD  omeprazole (PRILOSEC OTC) 20 MG tablet Take 20 mg by mouth daily.     Yes Historical Provider, MD  polyethylene glycol powder (MIRALAX) powder Take 17 g by mouth 2 (two) times daily. Take one scoop in 8 ounces of water two times a day as needed.    Yes Historical Provider, MD  rosuvastatin (CRESTOR) 20 MG tablet Take 1 tablet (20 mg total) by mouth  at bedtime. 10/09/10 10/09/11 Yes Michele Mcalpine, MD  saxagliptin HCl (ONGLYZA) 5 MG TABS tablet Take 1 tablet (5 mg total) by mouth daily. 10/09/10  Yes Michele Mcalpine, MD  acetaminophen (TYLENOL) 325 MG tablet Take 1-2 tablets (325-650 mg total) by mouth every 6 (six) hours as needed. 03/13/11 03/23/11  Mearl Latin, PA  enoxaparin (LOVENOX) 30 MG/0.3ML SOLN Inject 0.3 mLs (30 mg total) into the skin daily. 03/13/11   Mearl Latin, PA  hydrochlorothiazide (MICROZIDE) 12.5 MG capsule Take 1 capsule (12.5 mg total) by mouth daily. 03/13/11 03/12/12  Mearl Latin, PA  lisinopril (PRINIVIL,ZESTRIL) 5 MG tablet Take 1 tablet (5 mg total)  by mouth daily. 03/13/11 03/12/12  Mearl Latin, PA  methocarbamol (ROBAXIN) 500 MG tablet Take 1-2 tablets (500-1,000 mg total) by mouth every 6 (six) hours as needed. 03/13/11 03/23/11  Mearl Latin, PA  oxyCODONE (OXY IR/ROXICODONE) 5 MG immediate release tablet Take 1-3 tablets (5-15 mg total) by mouth every 3 (three) hours as needed for pain. 03/13/11 03/23/11  Mearl Latin, PA  oxyCODONE-acetaminophen (PERCOCET) 5-325 MG per tablet Take 1-2 tablets by mouth every 6 (six) hours as needed for pain. 03/13/11 03/23/11  Mearl Latin, PA  pioglitazone (ACTOS) 30 MG tablet Take 1 tablet (30 mg total) by mouth daily. 03/13/11 03/12/12  Mearl Latin, PA    Current Facility-Administered Medications  Medication Dose Route Frequency Provider Last Rate Last Dose  . 0.9 % NaCl with KCl 20 mEq/ L  infusion   Intravenous Continuous Mearl Latin, PA 20 mL/hr at 03/14/11 1700    . acetaminophen (TYLENOL) tablet 325-650 mg  325-650 mg Oral Q6H PRN Mearl Latin, PA      . allopurinol (ZYLOPRIM) tablet 300 mg  300 mg Oral Daily Mearl Latin, PA   300 mg at 03/14/11 1016  . ALPRAZolam Prudy Feeler) tablet 0.25-0.5 mg  0.25-0.5 mg Oral TID PRN Mearl Latin, PA   0.25 mg at 03/11/11 0014  . aspirin tablet 325 mg  325 mg Oral Daily Mearl Latin, PA   325 mg at 03/14/11 1016  . docusate sodium (COLACE) capsule 100 mg  100 mg Oral BID Doralee Albino Handy   100 mg at 03/14/11 1016  . enoxaparin (LOVENOX) injection 40 mg  40 mg Subcutaneous Daily Mearl Latin, PA   40 mg at 03/14/11 1016  . glyBURIDE (DIABETA) tablet 10 mg  10 mg Oral BID WC Mearl Latin, PA   10 mg at 03/14/11 1654  . hydrALAZINE (APRESOLINE) injection 10 mg  10 mg Intravenous Q4H PRN Mearl Latin, PA      . hydrochlorothiazide (MICROZIDE) capsule 25 mg  25 mg Oral Daily Mearl Latin, PA   25 mg at 03/14/11 1016  . insulin aspart (novoLOG) injection 0-15 Units  0-15 Units Subcutaneous TID WC Mearl Latin, PA   2 Units at 03/14/11 1147  . insulin aspart  (novoLOG) injection 0-5 Units  0-5 Units Subcutaneous QHS Mearl Latin, PA      . insulin aspart (novoLOG) injection 4 Units  4 Units Subcutaneous TID WC Mearl Latin, PA   4 Units at 03/14/11 1147  . linagliptin (TRADJENTA) tablet 5 mg  5 mg Oral Daily Mearl Latin, PA   5 mg at 03/14/11 1016  . lisinopril (PRINIVIL,ZESTRIL) tablet 5 mg  5 mg Oral Daily Mearl Latin, PA   5 mg at 03/14/11  1016  . metFORMIN (GLUCOPHAGE) tablet 1,000 mg  1,000 mg Oral BID WC Mearl Latin, PA   1,000 mg at 03/14/11 1654  . methocarbamol (ROBAXIN) 1,000 mg in dextrose 5 % 50 mL IVPB  1,000 mg Intravenous Q6H PRN Mearl Latin, PA      . methocarbamol (ROBAXIN) tablet 500-1,000 mg  500-1,000 mg Oral Q6H PRN Mearl Latin, PA   500 mg at 03/13/11 0453  . metoCLOPramide (REGLAN) tablet 5-10 mg  5-10 mg Oral Q8H PRN Doralee Albino Handy   10 mg at 03/14/11 1610  . morphine 2 MG/ML injection 1-2 mg  1-2 mg Intravenous Q2H PRN Mearl Latin, PA   2 mg at 03/14/11 1836  . multivitamins ther. w/minerals tablet 1 tablet  1 tablet Oral Daily Elwin Sleight, PHARMD   1 tablet at 03/14/11 1029  . omeprazole (PRILOSEC OTC) EC tablet 20 mg  20 mg Oral Q0700 Mearl Latin, PA   20 mg at 03/14/11 0641  . ondansetron (ZOFRAN) injection 4 mg  4 mg Intravenous Q6H PRN Doralee Albino Handy   4 mg at 03/14/11 0507  . oxyCODONE (Oxy IR/ROXICODONE) immediate release tablet 5-15 mg  5-15 mg Oral Q3H PRN Mearl Latin, PA   10 mg at 03/13/11 2026  . oxyCODONE-acetaminophen (PERCOCET) 5-325 MG per tablet 1-2 tablet  1-2 tablet Oral Q6H PRN Mearl Latin, PA   2 tablet at 03/13/11 1840  . pioglitazone (ACTOS) tablet 30 mg  30 mg Oral Daily Mearl Latin, PA   30 mg at 03/14/11 1016  . polyethylene glycol (MIRALAX / GLYCOLAX) packet 17 g  17 g Oral BID Dannielle Huh, PHARMD   17 g at 03/14/11 0020  . rosuvastatin (CRESTOR) tablet 20 mg  20 mg Oral QHS Mearl Latin, PA   20 mg at 03/14/11 0019  . senna-docusate (Senokot-S) tablet 1 tablet  1 tablet Oral  QHS Doralee Albino Handy   1 tablet at 03/14/11 0020    Allergies as of 03/09/2011 - Review Complete 03/09/2011  Allergen Reaction Noted  . Hydromorphone hcl    . Promethazine hcl      Family History  Problem Relation Age of Onset  . Heart attack Father   . Cancer Brother   . Dementia Mother     History   Social History  . Marital Status: Married    Spouse Name: Bosie Clos    Number of Children: N/A  . Years of Education: N/A   Occupational History  . barber     still working   Social History Main Topics  . Smoking status: Never Smoker   . Smokeless tobacco: Never Used  . Alcohol Use: Yes     Very occasional use  . Drug Use: No  . Sexually Active: No   Other Topics Concern  . Not on file   Social History Narrative  . No narrative on file    Review of Systems: All negative except as stated above in HPI.  Physical Exam: Vital signs: Filed Vitals:   03/14/11 1300  BP: 153/82  Pulse: 100  Temp: 98.2 F (36.8 C)  Resp: 18   Last BM Date: 03/09/11 General:   Alert,  Well-developed, well-nourished, pleasant and cooperative in NAD Lungs:  Clear throughout to auscultation.   No wheezes, crackles, or rhonchi. No acute distress. Heart:  Regular rate and rhythm; no murmurs, clicks, rubs,  or gallops. Abdomen: Distended with diffuse tenderness and high-pitched  bowel sounds  Rectal:  Deferred  GI:  Lab Results:  Basename 03/13/11 0707 03/12/11 0605  WBC 9.9 7.9  HGB 10.8* 10.6*  HCT 33.1* 33.1*  PLT 195 158   BMET  Basename 03/13/11 0707  NA 139  K 4.3  CL 106  CO2 24  GLUCOSE 111*  BUN 13  CREATININE 1.10  CALCIUM 8.6   LFT No results found for this basename: PROT,ALBUMIN,AST,ALT,ALKPHOS,BILITOT,BILIDIR,IBILI in the last 72 hours PT/INR No results found for this basename: LABPROT:2,INR:2 in the last 72 hours   Studies/Results: Dg Abd 1 View  03/14/2011  *RADIOLOGY REPORT*  Clinical Data: Evaluate for ileus.  ABDOMEN - 1 VIEW  Comparison: None.   Findings:   Generalized distention of small and vertically large bowel with distal rectal gas.  No visible free air on this recumbent film.  Elevated right and left hemidiaphragms.  Surgical clips on the right from previous nephrectomy.  Degenerative changes lumbar spine.  IMPRESSION: Findings consistent with generalized small bowel and colonic ileus.  Original Report Authenticated By: Elsie Stain, [name]    Impression: 70yo male with progressive abdominal distention and abdominal pain in the postoperative setting, whose exam is concerning for a bowel obstruction over an adynamic ileus. He still could have an ileus from his postoperative state and pain meds. Will make him NPO. Do a abd/pelvis CT with IV contrast only to better assess his anatomy. He likely has scar tissue/adhesions based on his 2 intestinal blockages in 1994 that required surgery and his history of kidney surgery.  Plan: Per above: Abd/pelvis CT with IV contrast only to look for a bowel obstruction. Strict NPO. Keep potassium in normal range. May need an NG tube and/or general surgery consult depending on CT findings.   LOS: 5 days   Jillyn Stacey C.  03/14/2011, 7:38 PM

## 2011-03-15 ENCOUNTER — Encounter (HOSPITAL_COMMUNITY): Payer: Self-pay

## 2011-03-15 ENCOUNTER — Inpatient Hospital Stay (HOSPITAL_COMMUNITY): Payer: Medicare Other

## 2011-03-15 DIAGNOSIS — I359 Nonrheumatic aortic valve disorder, unspecified: Secondary | ICD-10-CM

## 2011-03-15 LAB — GLUCOSE, CAPILLARY
Glucose-Capillary: 117 mg/dL — ABNORMAL HIGH (ref 70–99)
Glucose-Capillary: 130 mg/dL — ABNORMAL HIGH (ref 70–99)
Glucose-Capillary: 145 mg/dL — ABNORMAL HIGH (ref 70–99)

## 2011-03-15 MED ORDER — IOHEXOL 300 MG/ML  SOLN
80.0000 mL | Freq: Once | INTRAMUSCULAR | Status: AC | PRN
  Administered 2011-03-15: 80 mL via INTRAVENOUS

## 2011-03-15 NOTE — Progress Notes (Signed)
  Echocardiogram 2D Echocardiogram has been performed.  Juanita Laster Daimen Shovlin, RDCS 03/15/2011, 12:36 PM

## 2011-03-15 NOTE — Progress Notes (Signed)
Subjective: Right LE doing ok.  Abdominal symptoms unchanged from yesterday.  Had CT abd late last night.   Objective: Vital signs in last 24 hours: Temp:  [98.2 F (36.8 C)-98.7 F (37.1 C)] 98.7 F (37.1 C) (12/02 0605) Pulse Rate:  [100-107] 107  (12/02 0605) Resp:  [16-18] 16  (12/02 0605) BP: (126-153)/(53-82) 143/69 mmHg (12/02 0605) SpO2:  [96 %-98 %] 98 % (12/02 0605)  Intake/Output from previous day: 12/01 0701 - 12/02 0700 In: 786 [P.O.:600; I.V.:186] Out: 900 [Urine:900] Intake/Output this shift: Total I/O In: -  Out: 200 [Urine:200]   Basename 03/13/11 0707  HGB 10.8*    Basename 03/13/11 0707  WBC 9.9  RBC 3.33*  HCT 33.1*  PLT 195    Basename 03/13/11 0707  NA 139  K 4.3  CL 106  CO2 24  BUN 13  CREATININE 1.10  GLUCOSE 111*  CALCIUM 8.6   No results found for this basename: LABPT:2,INR:2 in the last 72 hours  Exam:  Nvi.  Dressing c/d/i.    Assessment/Plan: **Greatly appreciate GI and Medicine help.   Continue present care.  Dr Carola Frost to follow in morning.   Peter Nicholson,Peter Nicholson 03/15/2011, 10:40 AM

## 2011-03-15 NOTE — Progress Notes (Signed)
Subjective: Continuing to complain of abdominal pain.  No other specific concerns.  Objective: Vital signs in last 24 hours: Filed Vitals:   03/14/11 0505 03/14/11 1300 03/14/11 2140 03/15/11 0605  BP: 161/84 153/82 126/53 143/69  Pulse: 120 100 104 107  Temp: 99.5 F (37.5 C) 98.2 F (36.8 C) 98.6 F (37 C) 98.7 F (37.1 C)  TempSrc:      Resp: 16 18 16 16   Height:      Weight:      SpO2: 96% 96% 96% 98%   Weight change:   Intake/Output Summary (Last 24 hours) at 03/15/11 1450 Last data filed at 03/15/11 0900  Gross per 24 hour  Intake    186 ml  Output    900 ml  Net   -714 ml   Physical Exam: General: Awake, Oriented, No acute distress. HEENT: EOMI. Neck: Supple CV: S1 and S2 Lungs: Clear to ascultation bilaterally Abdomen: Distended, minimal guarding, hyperactive and high-pitched bowel sounds. Ext: Good pulses. Trace edema.  Lab Results:  [facility] 03/13/11 0707  NA 139  K 4.3  CL 106  CO2 24  GLUCOSE 111*  BUN 13  CREATININE 1.10  CALCIUM 8.6  MG --  PHOS --   No results found for this basename: AST:2,ALT:2,ALKPHOS:2,BILITOT:2,PROT:2,ALBUMIN:2 in the last 72 hours No results found for this basename: LIPASE:2,AMYLASE:2 in the last 72 hours  Basename 03/13/11 0707  WBC 9.9  NEUTROABS --  HGB 10.8*  HCT 33.1*  MCV 99.4  PLT 195    Basename 03/14/11 2226  CKTOTAL 3120*  CKMB 5.6*  CKMBINDEX --  TROPONINI <0.30   No results found for this basename: POCBNP:3 in the last 72 hours No results found for this basename: DDIMER:2 in the last 72 hours No results found for this basename: HGBA1C:2 in the last 72 hours  Basename 03/14/11 2226  CHOL 100  HDL 37*  LDLCALC 43  TRIG 213  CHOLHDL 2.7  LDLDIRECT --   No results found for this basename: TSH,T4TOTAL,FREET3,T3FREE,THYROIDAB in the last 72 hours No results found for this basename: VITAMINB12:2,FOLATE:2,FERRITIN:2,TIBC:2,IRON:2,RETICCTPCT:2 in the last 72 hours  Micro Results: No results  found for this or any previous visit (from the past 240 hour(s)).  Studies/Results: Dg Abd 1 View  03/14/2011  *RADIOLOGY REPORT*  Clinical Data: Evaluate for ileus.  ABDOMEN - 1 VIEW  Comparison: None.  Findings:   Generalized distention of small and vertically large bowel with distal rectal gas.  No visible free air on this recumbent film.  Elevated right and left hemidiaphragms.  Surgical clips on the right from previous nephrectomy.  Degenerative changes lumbar spine.  IMPRESSION: Findings consistent with generalized small bowel and colonic ileus.  Original Report Authenticated By: Elsie Stain, [name]   Ct Abdomen Pelvis W Contrast  03/15/2011  *RADIOLOGY REPORT*  Clinical Data: Progressively worsening abdominal distension and abdominal pain status post right lower leg surgery.  CT ABDOMEN AND PELVIS WITH CONTRAST  Technique:  Multidetector CT imaging of the abdomen and pelvis was performed following the standard protocol during bolus administration of intravenous contrast.  Contrast: 80mL OMNIPAQUE IOHEXOL 300 MG/ML IV SOLN  Comparison: MRI of the lumbar spine performed 11/22/2010; abdominal radiograph performed 03/14/2011  Findings: Bibasilar atelectasis is noted.  A small hiatal hernia is noted.  The liver and spleen are unremarkable in appearance. The patient is status post cholecystectomy, with clips noted at the gallbladder fossa.  The pancreas and adrenal glands are unremarkable.  The patient is status post  right-sided nephrectomy.  There is moderate left-sided hydronephrosis, with marked prominence of the left renal pelvis; this appears to reflect obstruction at the left ureteropelvic junction, without evidence of an obstructing stone. No definite mass is seen.  A 1.7 cm cyst is noted at the interpole region of the left kidney, and a smaller 1.0 cm cyst is noted medially; there is incomplete rotation of the left kidney.  Nonspecific perinephric stranding is seen.  Note is made of a small  herniation of a tiny loop of mid ileum into a thin anterior abdominal wall hernia.  The small bowel is diffusely decompressed and grossly unremarkable in appearance.  The stomach is also decompressed and is within normal limits.  No acute vascular abnormalities are seen.  Scattered calcification is noted along the abdominal aorta and its branches.  There is diffuse distension of the colon; the cecum is also mildly distended, measuring 10.1 cm in diameter.  The colon is partially filled with fluid.  Mild diverticulosis is noted along the proximal sigmoid colon, without evidence of diverticulitis.  There is gradual decompression of the colon at the level of the rectosigmoid junction and rectum; this likely reflects colonic ileus.  The appendix contains air, without definite evidence for appendicitis.  Soft tissue stranding is noted at the right lower quadrant, but this is nonspecific in appearance.  Surrounding postoperative change is seen.  Mild nonspecific stranding is noted tracking along the paracolic gutters bilaterally.  The bladder is mildly distended and grossly unremarkable in appearance.  The prostate remains normal in size, with scattered calcification.  Small bilateral inguinal hernias are noted, containing only fat.  No inguinal lymphadenopathy is seen.  No acute osseous abnormalities are identified.  IMPRESSION:  1.  Diffuse colonic distension; the colon is filled with air and fluid.  Gradual decompression of the colon at the level of the rectosigmoid junction; this likely reflects colonic ileus. 2.  Small bowel diffusely decompressed and grossly unremarkable in appearance. 3.  Note of small herniation of a tiny loop of mid ileum into a thin anterior abdominal wall hernia; this does not appear be causing obstruction. 4.  Moderate left-sided hydronephrosis, with marked prominence of the left renal pelvis; this appears to reflect obstruction at the left ureteropelvic junction, possibly due to a stricture,  without evidence of obstructing stone or mass. 5.  Small hiatal hernia seen. 6.  Small bilateral inguinal hernias, containing only fat. 7.  Scattered calcification along the abdominal aorta and its branches. 8.  Mild diverticulosis along the proximal sigmoid colon, without evidence of diverticulitis. 9.  Bibasilar atelectasis noted. 10.  Left renal cysts seen.  Original Report Authenticated By: Tonia Ghent, [name]    Medications: I have reviewed the patient's current medications. Scheduled Meds:   . allopurinol  300 mg Oral Daily  . aspirin  325 mg Oral Daily  . dextrose      . docusate sodium  100 mg Oral BID  . enoxaparin (LOVENOX) injection  40 mg Subcutaneous Daily  . hydrochlorothiazide  25 mg Oral Daily  . insulin aspart  0-15 Units Subcutaneous Q4H  . lisinopril  5 mg Oral Daily  . multivitamins ther. w/minerals  1 tablet Oral Daily  . pantoprazole (PROTONIX) IV  40 mg Intravenous QHS  . polyethylene glycol  17 g Oral BID  . senna-docusate  1 tablet Oral QHS  . DISCONTD: glyBURIDE  10 mg Oral BID WC  . DISCONTD: insulin aspart  0-15 Units Subcutaneous TID WC  . DISCONTD:  insulin aspart  0-5 Units Subcutaneous QHS  . DISCONTD: insulin aspart  4 Units Subcutaneous TID WC  . DISCONTD: linagliptin  5 mg Oral Daily  . DISCONTD: metFORMIN  1,000 mg Oral BID WC  . DISCONTD: omeprazole  20 mg Oral Q0700  . DISCONTD: pioglitazone  30 mg Oral Daily  . DISCONTD: rosuvastatin  20 mg Oral QHS   Continuous Infusions:   . dextrose 5 % and 0.45% NaCl 75 mL/hr at 03/15/11 1151  . DISCONTD: 0.9 % NaCl with KCl 20 mEq / L 20 mL/hr at 03/14/11 1700   PRN Meds:.acetaminophen, ALPRAZolam, hydrALAZINE, iohexol, methocarbamol(ROBAXIN) IV, methocarbamol, metoCLOPramide, morphine injection, ondansetron (ZOFRAN) IV, oxyCODONE, oxyCODONE-acetaminophen  Assessment/Plan: Principal Problem:  *Closed fracture of tibial plateau Active Problems:  DIABETES MELLITUS  HYPERCHOLESTEROLEMIA  ANXIETY   HYPERTENSION  CEREBROVASCULAR DISEASE  VENOUS INSUFFICIENCY  GERD  1.  Abdominal pain, CT shows colonic ileus.  Currently n.p.o. management as per GI.  2.  Type 2 diabetes, uncontrolled, likely because the patient is n.p.o. Patient's diabetic medications have been discontinued.  Continue sliding scale insulin.  Continue D5 half normal saline solution until able to tolerate an oral diet.  3. Closed fracture of tibial plateau, management as per orthopedic service.  4.  Hypertension, stable continue current medications.  5.  Left hemispheric TIA, continue aspirin 325 mg by mouth daily as per neurology.  Resume Plavix as per primary service when feasible.  6.  Hyperlipidemia.  Holding rosuvastatin secondary to #1.  7.  GERD.  On PPI.  Will continue to follow.  Thank you for the consult.   LOS: 6 days  Avianah Pellman A, MD 03/15/2011, 2:50 PM

## 2011-03-15 NOTE — Progress Notes (Signed)
Patient ID: Peter Nicholson, male   DOB: 1940-07-22, 70 y.o.   MRN: [id number] Peter Nicholson [facility] Progress Note  Peter Nicholson 70 y.o. [birth year]   Subjective: Continues to feel bloated and distended with abdominal pain. NPO. CT showed colonic ileus.  Objective: Vital signs: Filed Vitals:   03/15/11 1330  BP: 189/91  Pulse: 102  Temp: 99.1 F (37.3 C)  Resp: 20    Intake/Output last 24 hrs:  Intake/Output Summary (Last 24 hours) at 03/15/11 1623 Last data filed at 03/15/11 0900  Gross per 24 hour  Intake    186 ml  Output    900 ml  Net   -714 ml    Physical Exam: Gen: alert, no acute distress  Abd: distended, diffuse abdominal tenderness with guarding, high-pitched bowel sounds  Lab Results:  Peter Nicholson 03/13/11 0707  NA 139  K 4.3  CL 106  CO2 24  GLUCOSE 111*  BUN 13  CREATININE 1.10  CALCIUM 8.6  MG --  PHOS --   No results found for this basename: AST:2,ALT:2,ALKPHOS:2,BILITOT:2,PROT:2,ALBUMIN:2 in the last 72 hours  Basename 03/13/11 0707  WBC 9.9  NEUTROABS --  HGB 10.8*  HCT 33.1*  MCV 99.4  PLT 195      Assessment/Plan: Colonic ileus- will give a tap water enema tonight to see if that will stimulate passage of air and fluid. No role for NG tube since small bowel decompressed. Will give ice chips and sips of water. Dr. Percell Nicholson to see tomorrow.   Peter Nicholson C. 03/15/2011, 4:23 PM

## 2011-03-16 DIAGNOSIS — G459 Transient cerebral ischemic attack, unspecified: Secondary | ICD-10-CM

## 2011-03-16 DIAGNOSIS — K567 Ileus, unspecified: Secondary | ICD-10-CM

## 2011-03-16 LAB — GLUCOSE, CAPILLARY
Glucose-Capillary: 172 mg/dL — ABNORMAL HIGH (ref 70–99)
Glucose-Capillary: 117 mg/dL — ABNORMAL HIGH (ref 70–99)
Glucose-Capillary: 184 mg/dL — ABNORMAL HIGH (ref 70–99)
Glucose-Capillary: 155 mg/dL — ABNORMAL HIGH (ref 70–99)

## 2011-03-16 LAB — BASIC METABOLIC PANEL
CO2: 23 mEq/L (ref 19–32)
Chloride: 103 mEq/L (ref 96–112)
Creatinine, Ser: 1.05 mg/dL (ref 0.50–1.35)
GFR calc Af Amer: 80 mL/min — ABNORMAL LOW (ref >90)
Potassium: 2.9 mEq/L — ABNORMAL LOW (ref 3.5–5.1)

## 2011-03-16 MED ORDER — HYDROCODONE-ACETAMINOPHEN 5-325 MG PO TABS
1.0000 | ORAL_TABLET | Freq: Four times a day (QID) | ORAL | Status: DC | PRN
  Administered 2011-03-18: 2 via ORAL
  Administered 2011-03-18: 1 via ORAL
  Administered 2011-03-19 – 2011-03-23 (×10): 2 via ORAL
  Administered 2011-03-24: 1 via ORAL
  Administered 2011-03-26 – 2011-03-27 (×3): 2 via ORAL
  Filled 2011-03-16 (×5): qty 2
  Filled 2011-03-16: qty 1
  Filled 2011-03-16 (×2): qty 2
  Filled 2011-03-16: qty 1
  Filled 2011-03-16 (×7): qty 2

## 2011-03-16 NOTE — Progress Notes (Signed)
Physical Therapy Treatment Patient Details Name: Peter Nicholson MRN: [id number] DOB: 1940-06-05 Today's Date: 03/16/2011  PT Assessment/Plan  PT - Assessment/Plan Comments on Treatment Session: Pt requesting to get back into bed. Distance limited by fatigue and incresaed pain with dependent position. Plan for pt to receive ORIF on Thursday. PT Plan: Discharge plan remains appropriate PT Frequency: Min 5X/week Follow Up Recommendations: Skilled nursing facility Equipment Recommended: Defer to next venue PT Goals  Acute Rehab PT Goals PT Goal Formulation: With patient Time For Goal Achievement: 2 weeks PT Goal: Supine/Side to Sit - Progress: Progressing toward goal PT Goal: Sit to Supine/Side - Progress: Progressing toward goal PT Transfer Goal: Bed to Chair/Chair to Bed - Progress: Progressing toward goal PT Goal: Ambulate - Progress: Progressing toward goal PT Goal: Up/Down Stairs - Progress: Not met Additional Goals PT Goal: Additional Goal #1 - Progress: Progressing toward goal  PT Treatment Precautions/Restrictions  Precautions Precautions: Fall Restrictions Weight Bearing Restrictions: Yes RLE Weight Bearing: Non weight bearing Mobility (including Balance) Bed Mobility Bed Mobility: Yes Sit to Supine - Left: 4: Min assist Sit to Supine - Left Details (indicate cue type and reason): VC for sequencing. Assist of RLE into bed with minimal trunk support Scooting to [facility]: 3: Mod assist;With trapeze Scooting to [facility] Details (indicate cue type and reason): Assisted pt with trapeze and VC for sequencing with LLE. Transfers Transfers: Yes Sit to Stand: 4: Min assist;From chair/3-in-1;With upper extremity assist Sit to Stand Details (indicate cue type and reason): VC for sequencing. Pt with increased mobility upon standing this session. Stand to Sit: 4: Min assist;To bed;With upper extremity assist Stand to Sit Details: VC for hand placement. Assist with  RLE Ambulation/Gait Ambulation/Gait: Yes Ambulation/Gait Assistance: 4: Min assist Ambulation/Gait Assistance Details (indicate cue type and reason): VC for proper sequencing. Pt able to maintain NWB without cueing. Distance limited by fatigue Ambulation Distance (Feet): 10 Feet Assistive device: Rolling walker Gait Pattern: Step-to pattern;Trunk flexed Gait velocity: Decreased gait speed Stairs: No    Exercise    End of Session PT - End of Session Equipment Utilized During Treatment: Gait belt Activity Tolerance: Patient limited by fatigue;Treatment limited secondary to medical complications (Comment) Patient left: with call bell in reach;in bed Nurse Communication: Mobility status for ambulation General Behavior During Session: [facility] for tasks performed Cognition: [facility] for tasks performed  [name] 03/16/2011, 3:38 PM  03/16/2011 [name] DPT PAGER: [phone] OFFICE: [phone]

## 2011-03-16 NOTE — Progress Notes (Signed)
[facility] Progress Note  Subjective: Patient notes slightly decreased abdominal distention and discomfort. He is passing small amounts of gas but no stool.  Objective: Vital signs in last 24 hours: Temp:  [98.7 F (37.1 C)-99.1 F (37.3 C)] 98.7 F (37.1 C) (12/02 2135) Pulse Rate:  [102-106] 106  (12/02 2135) Resp:  [16-20] 16  (12/02 2135) BP: (169-189)/(61-91) 169/61 mmHg (12/02 2135) SpO2:  [93 %-96 %] 96 % (12/02 2135) Weight change:    PE: Abdomen distended slightly irregular contour. Decreased bowel sounds  Lab Results: Results for orders placed during the hospital encounter of 03/09/11 (from the past 24 hour(s))  GLUCOSE, CAPILLARY     Status: Abnormal   Collection Time   03/15/11 11:06 AM      Component Value Range   Glucose-Capillary 119 (*) 70 - 99 (mg/dL)   Comment 1 Notify RN    GLUCOSE, CAPILLARY     Status: Abnormal   Collection Time   03/15/11  4:13 PM      Component Value Range   Glucose-Capillary 145 (*) 70 - 99 (mg/dL)   Comment 1 Notify RN    GLUCOSE, CAPILLARY     Status: Abnormal   Collection Time   03/15/11  9:42 PM      Component Value Range   Glucose-Capillary 142 (*) 70 - 99 (mg/dL)  GLUCOSE, CAPILLARY     Status: Abnormal   Collection Time   03/16/11  4:15 AM      Component Value Range   Glucose-Capillary 172 (*) 70 - 99 (mg/dL)    Studies/Results: Dg Abd 1 View  03/14/2011  *RADIOLOGY REPORT*  Clinical Data: Evaluate for ileus.  ABDOMEN - 1 VIEW  Comparison: None.  Findings:   Generalized distention of small and vertically large bowel with distal rectal gas.  No visible free air on this recumbent film.  Elevated right and left hemidiaphragms.  Surgical clips on the right from previous nephrectomy.  Degenerative changes lumbar spine.  IMPRESSION: Findings consistent with generalized small bowel and colonic ileus.  Original Report Authenticated By: Elsie Stain, [name]   Ct Abdomen Pelvis W Contrast  03/15/2011  *RADIOLOGY REPORT*   Clinical Data: Progressively worsening abdominal distension and abdominal pain status post right lower leg surgery.  CT ABDOMEN AND PELVIS WITH CONTRAST  Technique:  Multidetector CT imaging of the abdomen and pelvis was performed following the standard protocol during bolus administration of intravenous contrast.  Contrast: 80mL OMNIPAQUE IOHEXOL 300 MG/ML IV SOLN  Comparison: MRI of the lumbar spine performed 11/22/2010; abdominal radiograph performed 03/14/2011  Findings: Bibasilar atelectasis is noted.  A small hiatal hernia is noted.  The liver and spleen are unremarkable in appearance. The patient is status post cholecystectomy, with clips noted at the gallbladder fossa.  The pancreas and adrenal glands are unremarkable.  The patient is status post right-sided nephrectomy.  There is moderate left-sided hydronephrosis, with marked prominence of the left renal pelvis; this appears to reflect obstruction at the left ureteropelvic junction, without evidence of an obstructing stone. No definite mass is seen.  A 1.7 cm cyst is noted at the interpole region of the left kidney, and a smaller 1.0 cm cyst is noted medially; there is incomplete rotation of the left kidney.  Nonspecific perinephric stranding is seen.  Note is made of a small herniation of a tiny loop of mid ileum into a thin anterior abdominal wall hernia.  The small bowel is diffusely decompressed and grossly unremarkable in appearance.  The stomach is also decompressed and is within normal limits.  No acute vascular abnormalities are seen.  Scattered calcification is noted along the abdominal aorta and its branches.  There is diffuse distension of the colon; the cecum is also mildly distended, measuring 10.1 cm in diameter.  The colon is partially filled with fluid.  Mild diverticulosis is noted along the proximal sigmoid colon, without evidence of diverticulitis.  There is gradual decompression of the colon at the level of the rectosigmoid junction and  rectum; this likely reflects colonic ileus.  The appendix contains air, without definite evidence for appendicitis.  Soft tissue stranding is noted at the right lower quadrant, but this is nonspecific in appearance.  Surrounding postoperative change is seen.  Mild nonspecific stranding is noted tracking along the paracolic gutters bilaterally.  The bladder is mildly distended and grossly unremarkable in appearance.  The prostate remains normal in size, with scattered calcification.  Small bilateral inguinal hernias are noted, containing only fat.  No inguinal lymphadenopathy is seen.  No acute osseous abnormalities are identified.  IMPRESSION:  1.  Diffuse colonic distension; the colon is filled with air and fluid.  Gradual decompression of the colon at the level of the rectosigmoid junction; this likely reflects colonic ileus. 2.  Small bowel diffusely decompressed and grossly unremarkable in appearance. 3.  Note of small herniation of a tiny loop of mid ileum into a thin anterior abdominal wall hernia; this does not appear be causing obstruction. 4.  Moderate left-sided hydronephrosis, with marked prominence of the left renal pelvis; this appears to reflect obstruction at the left ureteropelvic junction, possibly due to a stricture, without evidence of obstructing stone or mass. 5.  Small hiatal hernia seen. 6.  Small bilateral inguinal hernias, containing only fat. 7.  Scattered calcification along the abdominal aorta and its branches. 8.  Mild diverticulosis along the proximal sigmoid colon, without evidence of diverticulitis. 9.  Bibasilar atelectasis noted. 10.  Left renal cysts seen.  Original Report Authenticated By: Tonia Ghent, [name]      Assessment: Postoperative colonic ileus  Plan: 1. Try clear liquid diet 2. Check potassium 3. Repeat KUB 4. Consider rectal tube if no improvement    Addysin Porco C 03/16/2011, 7:37 AM

## 2011-03-16 NOTE — Progress Notes (Signed)
Subjective: 6 Days Post-Op Procedure(s) (LRB): EXTERNAL FIXATION LEG (Right)   Events of the weekend noted.  Patient feeling slightly better this morning. Decreased abdominal pain. No nausea or vomiting. No complaints of right lower extremity pain. Denies any chest pain, no shortness of breath, no nausea vomiting or diarrhea  Greatly appreciate medicine, GI and neuro assistance with this complicated patient. No bowel movements noted, minimal flatus  Objective: Current Vitals Blood pressure 157/59, pulse 108, temperature 98.4 F (36.9 C), temperature source Oral, resp. rate 16, height 5\' 7"  (1.702 m), weight 93.26 kg (205 lb 9.6 oz), SpO2 96.00%. Vital signs in last 24 hours: Temp:  [98.4 F (36.9 C)-99.1 F (37.3 C)] 98.4 F (36.9 C) (12/03 0640) Pulse Rate:  [102-108] 108  (12/03 0640) Resp:  [16-20] 16  (12/03 0640) BP: (157-189)/(59-91) 157/59 mmHg (12/03 0640) SpO2:  [93 %-96 %] 96 % (12/03 0640)  Intake/Output from previous day: 12/02 0701 - 12/03 0700 In: 2437.5 [I.V.:2437.5] Out: 1201 [Urine:1200; Stool:1]  LABS No results found for this basename: HGB:5 in the last 72 hours No results found for this basename: WBC:2,RBC:2,HCT:2,PLT:2 in the last 72 hours No results found for this basename: NA:2,K:2,CL:2,CO2:2,BUN:2,CREATININE:2,GLUCOSE:2,CALCIUM:2 in the last 72 hours No results found for this basename: LABPT:2,INR:2 in the last 72 hours    Physical Exam  Gen: No acute distress, appears to be comfortable Lungs: Clear bilaterally Cardiac: S1 and S2  Abd: Distended but bowel sounds noted Ext: Right lower extremity  Fixator and pin sites are stable and intact  Decrease swelling is noted to the right lower extremity  Skin wrinkles with gentle compression  No deep calf tenderness is noted  Extremities warm with a palpable dorsalis pedis pulse  No pain with passive stretching  Deep peroneal nerve, superficial peroneal nerve, tibial nerve sensory functions are  intact  EHL, FHL, anterior tibialis, posterior tibialis, gastrocsoleus complex motor function intact as well.   Imaging Dg Abd 1 View  03/14/2011  *RADIOLOGY REPORT*  Clinical Data: Evaluate for ileus.  ABDOMEN - 1 VIEW  Comparison: None.  Findings:   Generalized distention of small and vertically large bowel with distal rectal gas.  No visible free air on this recumbent film.  Elevated right and left hemidiaphragms.  Surgical clips on the right from previous nephrectomy.  Degenerative changes lumbar spine.  IMPRESSION: Findings consistent with generalized small bowel and colonic ileus.  Original Report Authenticated By: Elsie Stain, [name]   Ct Abdomen Pelvis W Contrast  03/15/2011  *RADIOLOGY REPORT*  Clinical Data: Progressively worsening abdominal distension and abdominal pain status post right lower leg surgery.  CT ABDOMEN AND PELVIS WITH CONTRAST  Technique:  Multidetector CT imaging of the abdomen and pelvis was performed following the standard protocol during bolus administration of intravenous contrast.  Contrast: 80mL OMNIPAQUE IOHEXOL 300 MG/ML IV SOLN  Comparison: MRI of the lumbar spine performed 11/22/2010; abdominal radiograph performed 03/14/2011  Findings: Bibasilar atelectasis is noted.  A small hiatal hernia is noted.  The liver and spleen are unremarkable in appearance. The patient is status post cholecystectomy, with clips noted at the gallbladder fossa.  The pancreas and adrenal glands are unremarkable.  The patient is status post right-sided nephrectomy.  There is moderate left-sided hydronephrosis, with marked prominence of the left renal pelvis; this appears to reflect obstruction at the left ureteropelvic junction, without evidence of an obstructing stone. No definite mass is seen.  A 1.7 cm cyst is noted at the interpole region of the left kidney,  and a smaller 1.0 cm cyst is noted medially; there is incomplete rotation of the left kidney.  Nonspecific perinephric stranding is  seen.  Note is made of a small herniation of a tiny loop of mid ileum into a thin anterior abdominal wall hernia.  The small bowel is diffusely decompressed and grossly unremarkable in appearance.  The stomach is also decompressed and is within normal limits.  No acute vascular abnormalities are seen.  Scattered calcification is noted along the abdominal aorta and its branches.  There is diffuse distension of the colon; the cecum is also mildly distended, measuring 10.1 cm in diameter.  The colon is partially filled with fluid.  Mild diverticulosis is noted along the proximal sigmoid colon, without evidence of diverticulitis.  There is gradual decompression of the colon at the level of the rectosigmoid junction and rectum; this likely reflects colonic ileus.  The appendix contains air, without definite evidence for appendicitis.  Soft tissue stranding is noted at the right lower quadrant, but this is nonspecific in appearance.  Surrounding postoperative change is seen.  Mild nonspecific stranding is noted tracking along the paracolic gutters bilaterally.  The bladder is mildly distended and grossly unremarkable in appearance.  The prostate remains normal in size, with scattered calcification.  Small bilateral inguinal hernias are noted, containing only fat.  No inguinal lymphadenopathy is seen.  No acute osseous abnormalities are identified.  IMPRESSION:  1.  Diffuse colonic distension; the colon is filled with air and fluid.  Gradual decompression of the colon at the level of the rectosigmoid junction; this likely reflects colonic ileus. 2.  Small bowel diffusely decompressed and grossly unremarkable in appearance. 3.  Note of small herniation of a tiny loop of mid ileum into a thin anterior abdominal wall hernia; this does not appear be causing obstruction. 4.  Moderate left-sided hydronephrosis, with marked prominence of the left renal pelvis; this appears to reflect obstruction at the left ureteropelvic  junction, possibly due to a stricture, without evidence of obstructing stone or mass. 5.  Small hiatal hernia seen. 6.  Small bilateral inguinal hernias, containing only fat. 7.  Scattered calcification along the abdominal aorta and its branches. 8.  Mild diverticulosis along the proximal sigmoid colon, without evidence of diverticulitis. 9.  Bibasilar atelectasis noted. 10.  Left renal cysts seen.  Original Report Authenticated By: Tonia Ghent, [name]    Assessment/Plan: 6 Days Post-Op Procedure(s) (LRB): EXTERNAL FIXATION LEG (Right)  70 year old male status post fall  1. right bicondylar tibial plateau fracture status post external fixator postop day 6  Nonweightbearing right lower extremity  Continue with daily pin care and dressing changes  Skin is in much better condition and ready for surgical intervention at the end of the week  Given his GI issues I don't think he would be In the patient's best interest to proceed with surgery tomorrow as undergoing general anesthesia we'll likely exacerbate his current GI issues. We will likely hold on surgical intervention until the end of the week  Continue with ice and elevation as well.  2. Ileus  Per GI  Patient going to try clear liquid diet today  Labs are pending as is a repeat KUB  Does scheduled Reglan have a role in this patient at this time? 3. TIA  Again patient stopped Plavix about 5 days prior to his fall in preparation for an epidural steroid injection.  He is since been off his Plavix but has remained on aspirin  Continue aspirin as per  neurology recommendations 4. type 2 diabetes  Patient on D5 half-normal saline as he's been unable to tolerate an oral diet  He is also been on SSI since admission. 5.  Hypertension  Patient was on no home medications on admission  He was started on hydrochlorothiazide and lisinopril, both low dose, as his blood pressures were significantly elevated  BPs are trending down and his pressure  medications may still need to be titrated will defer to medicine service  6. Hyperlipidemia  Per medicine, his meds were held as he was n.p.o. 7. GERD  Continue with prophylaxis.    8. DVT/PE prophylaxis   Lovenox 40 mg subcutaneous daily   ASA  9. activity   Continue with PT OT   Aggressive IS 10. Pain  Will DC some of the stronger narcotics  Continue the Tylenol and will use low-dose narcotics for pain control 11. disposition   Greatly appreciate medicine, and GI, neuro   Again considering his GI issues would like to hold surgery until the end of the week as placing the patient under general anesthesia we'll likely exacerbate his GI issues    Also given patient's numerous medical comorbidities and issues I feel the patient is a candidate to be on the medicine service as the primary treatment team as his orthopedic issues are under control. At the medicine service has any questions regarding this they are to feel free to contact us at [phone]    Mearl Latin, PA-C 03/16/2011, 7:57 AM

## 2011-03-16 NOTE — Progress Notes (Signed)
Physical Therapy Treatment Patient Details Name: Peter Nicholson MRN: [id number] DOB: [birth year] Today's Date: 03/16/2011  PT Assessment/Plan  PT - Assessment/Plan Comments on Treatment Session: Upon entry, pt explained that he had two pain pills with nothing on his stomach and he was feeling a little lightheaded. He wanted to attempt therapy, but upon standing felt lightheaded and requested to sit down. Will attempt this afternoon pending availability.  PT Plan: Discharge plan remains appropriate PT Frequency: Min 5X/week Follow Up Recommendations: Skilled nursing facility Equipment Recommended: Defer to next venue PT Goals  Acute Rehab PT Goals PT Goal Formulation: With patient Time For Goal Achievement: 2 weeks  PT Treatment Precautions/Restrictions  Precautions Precautions: Fall Restrictions Weight Bearing Restrictions: Yes RLE Weight Bearing: Non weight bearing Mobility (including Balance) Bed Mobility Bed Mobility: No Transfers Transfers: Yes Sit to Stand: 4: Min assist;From chair/3-in-1;With upper extremity assist Sit to Stand Details (indicate cue type and reason): VC for hand placement into standing. Pt able to maintain NWB Stand to Sit: 4: Min assist;To chair/3-in-1;With upper extremity assist Stand to Sit Details: VC for hand placement and safety with LE Ambulation/Gait Ambulation/Gait: No    Exercise    End of Session PT - End of Session Equipment Utilized During Treatment: Gait belt Activity Tolerance: Patient limited by fatigue;Treatment limited secondary to medical complications (Comment) (Pt felt dizzy upon standing, relieved by sitting) Patient left: in chair;with call bell in reach Nurse Communication: Mobility status for ambulation General Behavior During Session: [facility] for tasks performed Cognition: [facility] for tasks performed  [name] 03/16/2011, 12:14 PM  03/16/2011 [name] DPT PAGER: [phone] OFFICE: [phone]

## 2011-03-16 NOTE — Progress Notes (Signed)
Subjective: Continuing to complain of abdominal pain, maybe feels a little bit better today.  Objective: Vital signs in last 24 hours: Filed Vitals:   03/15/11 1330 03/15/11 2135 03/16/11 0640 03/16/11 1400  BP: 189/91 169/61 157/59 124/51  Pulse: 102 106 108 92  Temp: 99.1 F (37.3 C) 98.7 F (37.1 C) 98.4 F (36.9 C) 98.3 F (36.8 C)  TempSrc:    Oral  Resp: 20 16 16 18   Height:      Weight:      SpO2: 93% 96% 96% 99%   Weight change:   Intake/Output Summary (Last 24 hours) at 03/16/11 1856 Last data filed at 03/16/11 1400  Gross per 24 hour  Intake   1140 ml  Output   2001 ml  Net   -861 ml   Physical Exam: General: Awake, Oriented, No acute distress. HEENT: EOMI. Neck: Supple CV: S1 and S2 Lungs: Clear to ascultation bilaterally Abdomen: Distended, minimal guarding, continues to have hyperactive and high-pitched bowel sounds. Ext: Good pulses. Trace edema.  Right leg braced.  Lab Results:  Basename 03/16/11 1000  NA 138  K 2.9*  CL 103  CO2 23  GLUCOSE 171*  BUN 15  CREATININE 1.05  CALCIUM 8.7  MG --  PHOS --   No results found for this basename: AST:2,ALT:2,ALKPHOS:2,BILITOT:2,PROT:2,ALBUMIN:2 in the last 72 hours No results found for this basename: LIPASE:2,AMYLASE:2 in the last 72 hours No results found for this basename: WBC:2,NEUTROABS:2,HGB:2,HCT:2,MCV:2,PLT:2 in the last 72 hours  Basename 03/14/11 2226  CKTOTAL 3120*  CKMB 5.6*  CKMBINDEX --  TROPONINI <0.30   No results found for this basename: POCBNP:3 in the last 72 hours No results found for this basename: DDIMER:2 in the last 72 hours No results found for this basename: HGBA1C:2 in the last 72 hours  Basename 03/14/11 2226  CHOL 100  HDL 37*  LDLCALC 43  TRIG 409  CHOLHDL 2.7  LDLDIRECT --   No results found for this basename: TSH,T4TOTAL,FREET3,T3FREE,THYROIDAB in the last 72 hours No results found for this basename:  VITAMINB12:2,FOLATE:2,FERRITIN:2,TIBC:2,IRON:2,RETICCTPCT:2 in the last 72 hours  Micro Results: No results found for this or any previous visit (from the past 240 hour(s)).  Studies/Results: Ct Abdomen Pelvis W Contrast  03/15/2011  *RADIOLOGY REPORT*  Clinical Data: Progressively worsening abdominal distension and abdominal pain status post right lower leg surgery.  CT ABDOMEN AND PELVIS WITH CONTRAST  Technique:  Multidetector CT imaging of the abdomen and pelvis was performed following the standard protocol during bolus administration of intravenous contrast.  Contrast: 80mL OMNIPAQUE IOHEXOL 300 MG/ML IV SOLN  Comparison: MRI of the lumbar spine performed 11/22/2010; abdominal radiograph performed 03/14/2011  Findings: Bibasilar atelectasis is noted.  A small hiatal hernia is noted.  The liver and spleen are unremarkable in appearance. The patient is status post cholecystectomy, with clips noted at the gallbladder fossa.  The pancreas and adrenal glands are unremarkable.  The patient is status post right-sided nephrectomy.  There is moderate left-sided hydronephrosis, with marked prominence of the left renal pelvis; this appears to reflect obstruction at the left ureteropelvic junction, without evidence of an obstructing stone. No definite mass is seen.  A 1.7 cm cyst is noted at the interpole region of the left kidney, and a smaller 1.0 cm cyst is noted medially; there is incomplete rotation of the left kidney.  Nonspecific perinephric stranding is seen.  Note is made of a small herniation of a tiny loop of mid ileum into a thin anterior abdominal  wall hernia.  The small bowel is diffusely decompressed and grossly unremarkable in appearance.  The stomach is also decompressed and is within normal limits.  No acute vascular abnormalities are seen.  Scattered calcification is noted along the abdominal aorta and its branches.  There is diffuse distension of the colon; the cecum is also mildly distended,  measuring 10.1 cm in diameter.  The colon is partially filled with fluid.  Mild diverticulosis is noted along the proximal sigmoid colon, without evidence of diverticulitis.  There is gradual decompression of the colon at the level of the rectosigmoid junction and rectum; this likely reflects colonic ileus.  The appendix contains air, without definite evidence for appendicitis.  Soft tissue stranding is noted at the right lower quadrant, but this is nonspecific in appearance.  Surrounding postoperative change is seen.  Mild nonspecific stranding is noted tracking along the paracolic gutters bilaterally.  The bladder is mildly distended and grossly unremarkable in appearance.  The prostate remains normal in size, with scattered calcification.  Small bilateral inguinal hernias are noted, containing only fat.  No inguinal lymphadenopathy is seen.  No acute osseous abnormalities are identified.  IMPRESSION:  1.  Diffuse colonic distension; the colon is filled with air and fluid.  Gradual decompression of the colon at the level of the rectosigmoid junction; this likely reflects colonic ileus. 2.  Small bowel diffusely decompressed and grossly unremarkable in appearance. 3.  Note of small herniation of a tiny loop of mid ileum into a thin anterior abdominal wall hernia; this does not appear be causing obstruction. 4.  Moderate left-sided hydronephrosis, with marked prominence of the left renal pelvis; this appears to reflect obstruction at the left ureteropelvic junction, possibly due to a stricture, without evidence of obstructing stone or mass. 5.  Small hiatal hernia seen. 6.  Small bilateral inguinal hernias, containing only fat. 7.  Scattered calcification along the abdominal aorta and its branches. 8.  Mild diverticulosis along the proximal sigmoid colon, without evidence of diverticulitis. 9.  Bibasilar atelectasis noted. 10.  Left renal cysts seen.  Original Report Authenticated By: Tonia Ghent, [name]     Medications: I have reviewed the patient's current medications. Scheduled Meds:    . allopurinol  300 mg Oral Daily  . aspirin  325 mg Oral Daily  . docusate sodium  100 mg Oral BID  . enoxaparin (LOVENOX) injection  40 mg Subcutaneous Daily  . hydrochlorothiazide  25 mg Oral Daily  . insulin aspart  0-15 Units Subcutaneous Q4H  . lisinopril  5 mg Oral Daily  . multivitamins ther. w/minerals  1 tablet Oral Daily  . pantoprazole (PROTONIX) IV  40 mg Intravenous QHS  . polyethylene glycol  17 g Oral BID  . senna-docusate  1 tablet Oral QHS   Continuous Infusions:    . dextrose 5 % and 0.45% NaCl 75 mL/hr at 03/16/11 1338   PRN Meds:.acetaminophen, ALPRAZolam, hydrALAZINE, HYDROcodone-acetaminophen, methocarbamol(ROBAXIN) IV, methocarbamol, metoCLOPramide, ondansetron (ZOFRAN) IV, DISCONTD:  morphine injection, DISCONTD: oxyCODONE, DISCONTD: oxyCODONE-acetaminophen  Assessment/Plan: 1.  Abdominal pain, CT shows colonic ileus.  Started on a clear liquid diet by GI.  2.  Type 2 diabetes, stable on sliding scale insulin continue D5 half-normal saline solution until able to tolerate good by mouth intake. Patient's diabetic medications on hold until blood sugars improved.   3. Closed fracture of tibial plateau, management as per orthopedic service.  4.  Hypertension, stable continue current medications of hydrochlorothiazide and lisinopril.  5.  Left hemispheric TIA, continue  aspirin 325 mg and resume Plavix when feasible.  6.  Hyperlipidemia.  Holding rosuvastatin secondary to #1.  7.  GERD.  On PPI.   LOS: 7 days  Avereigh Spainhower A, MD 03/16/2011, 6:56 PM

## 2011-03-16 NOTE — Progress Notes (Signed)
SNF bed available at [facility] however patient reports to me that his belly is delaying things- Updated SNF and will follow to further assist and plan for SNF at d/c.  Reece Levy, MSW, [place] [phone]

## 2011-03-17 ENCOUNTER — Inpatient Hospital Stay (HOSPITAL_COMMUNITY): Payer: Medicare Other

## 2011-03-17 ENCOUNTER — Other Ambulatory Visit: Payer: Self-pay

## 2011-03-17 DIAGNOSIS — E876 Hypokalemia: Secondary | ICD-10-CM

## 2011-03-17 LAB — COMPREHENSIVE METABOLIC PANEL
ALT: 27 U/L (ref 0–53)
Alkaline Phosphatase: 66 U/L (ref 39–117)
BUN: 12 mg/dL (ref 6–23)
CO2: 24 mEq/L (ref 19–32)
Calcium: 8.5 mg/dL (ref 8.4–10.5)
GFR calc Af Amer: 82 mL/min — ABNORMAL LOW (ref >90)
GFR calc non Af Amer: 71 mL/min — ABNORMAL LOW (ref >90)
Glucose, Bld: 179 mg/dL — ABNORMAL HIGH (ref 70–99)
Sodium: 134 mEq/L — ABNORMAL LOW (ref 135–145)
Total Protein: 5.7 g/dL — ABNORMAL LOW (ref 6.0–8.3)

## 2011-03-17 LAB — GLUCOSE, CAPILLARY
Glucose-Capillary: 125 mg/dL — ABNORMAL HIGH (ref 70–99)
Glucose-Capillary: 135 mg/dL — ABNORMAL HIGH (ref 70–99)

## 2011-03-17 LAB — CBC
HCT: 30.1 % — ABNORMAL LOW (ref 39.0–52.0)
Hemoglobin: 10.2 g/dL — ABNORMAL LOW (ref 13.0–17.0)
MCHC: 33.9 g/dL (ref 30.0–36.0)
RBC: 3.15 MIL/uL — ABNORMAL LOW (ref 4.22–5.81)
WBC: 7.4 10*3/uL (ref 4.0–10.5)

## 2011-03-17 LAB — BASIC METABOLIC PANEL
BUN: 13 mg/dL (ref 6–23)
BUN: 11 mg/dL (ref 6–23)
BUN: 11 mg/dL (ref 6–23)
CO2: 26 mEq/L (ref 19–32)
CO2: 26 mEq/L (ref 19–32)
CO2: 25 mEq/L (ref 19–32)
Chloride: 102 mEq/L (ref 96–112)
Chloride: 101 mEq/L (ref 96–112)
GFR calc non Af Amer: 65 mL/min — ABNORMAL LOW (ref >90)
Glucose, Bld: 122 mg/dL — ABNORMAL HIGH (ref 70–99)
Glucose, Bld: 165 mg/dL — ABNORMAL HIGH (ref 70–99)
Glucose, Bld: 156 mg/dL — ABNORMAL HIGH (ref 70–99)
Potassium: 2.6 mEq/L — CL (ref 3.5–5.1)
Potassium: 2.6 mEq/L — CL (ref 3.5–5.1)
Potassium: 2.6 mEq/L — CL (ref 3.5–5.1)
Sodium: 138 mEq/L (ref 135–145)
Sodium: 134 mEq/L — ABNORMAL LOW (ref 135–145)

## 2011-03-17 LAB — MAGNESIUM: Magnesium: 1.3 mg/dL — ABNORMAL LOW (ref 1.5–2.5)

## 2011-03-17 MED ORDER — POTASSIUM CHLORIDE CRYS ER 20 MEQ PO TBCR
40.0000 meq | EXTENDED_RELEASE_TABLET | Freq: Once | ORAL | Status: AC
  Administered 2011-03-17: 40 meq via ORAL

## 2011-03-17 MED ORDER — POTASSIUM CHLORIDE CRYS ER 20 MEQ PO TBCR
EXTENDED_RELEASE_TABLET | ORAL | Status: AC
  Filled 2011-03-17: qty 2

## 2011-03-17 MED ORDER — POTASSIUM CHLORIDE CRYS ER 20 MEQ PO TBCR
20.0000 meq | EXTENDED_RELEASE_TABLET | Freq: Two times a day (BID) | ORAL | Status: AC
  Administered 2011-03-18 (×2): 20 meq via ORAL
  Filled 2011-03-17 (×3): qty 1

## 2011-03-17 MED ORDER — MAGNESIUM OXIDE 400 MG PO TABS: 400.0000 mg | ORAL_TABLET | Freq: Every day | ORAL | Status: DC

## 2011-03-17 MED ORDER — POTASSIUM CHLORIDE 10 MEQ/100ML IV SOLN
10.0000 meq | INTRAVENOUS | Status: AC
  Administered 2011-03-17 – 2011-03-18 (×5): 10 meq via INTRAVENOUS
  Filled 2011-03-17 (×4): qty 100

## 2011-03-17 MED ORDER — POTASSIUM CHLORIDE CRYS ER 20 MEQ PO TBCR: 20.0000 meq | EXTENDED_RELEASE_TABLET | Freq: Three times a day (TID) | ORAL | Status: DC

## 2011-03-17 MED ORDER — LISINOPRIL 10 MG PO TABS
10.0000 mg | ORAL_TABLET | Freq: Every day | ORAL | Status: DC
Start: 2011-03-17 — End: 2011-03-27
  Administered 2011-03-17 – 2011-03-27 (×11): 10 mg via ORAL
  Filled 2011-03-17 (×11): qty 1

## 2011-03-17 MED ORDER — MAGNESIUM SULFATE 40 MG/ML IJ SOLN
2.0000 g | Freq: Once | INTRAMUSCULAR | Status: AC
  Administered 2011-03-17: 2 g via INTRAVENOUS
  Filled 2011-03-17: qty 50

## 2011-03-17 MED ORDER — MAGNESIUM SULFATE 50 % IJ SOLN
2.0000 g | Freq: Once | INTRAMUSCULAR | Status: DC
  Filled 2011-03-17: qty 4

## 2011-03-17 MED ORDER — POTASSIUM CHLORIDE 10 MEQ/100ML IV SOLN
10.0000 meq | INTRAVENOUS | Status: AC
  Administered 2011-03-17 (×3): 10 meq via INTRAVENOUS
  Filled 2011-03-17 (×4): qty 100

## 2011-03-17 MED ORDER — PANTOPRAZOLE SODIUM 40 MG PO TBEC
40.0000 mg | DELAYED_RELEASE_TABLET | Freq: Every day | ORAL | Status: DC
  Administered 2011-03-17 – 2011-03-27 (×11): 40 mg via ORAL
  Filled 2011-03-17 (×10): qty 1

## 2011-03-17 NOTE — Progress Notes (Signed)
Spoke at length with Dr. Betti Cruz re plan.  Agree with above.

## 2011-03-17 NOTE — Progress Notes (Signed)
Patient ID: Peter Nicholson, male   DOB: 1941-03-09, 70 y.o.   MRN: [id number] [facility] Gastroenterology Progress Note  Subjective: Still of a lot of upper abdominal distention with only slight flatus and stool output.  Objective: Vital signs in last 24 hours: Temp:  [98 F (36.7 C)-98.9 F (37.2 C)] 98.9 F (37.2 C) (12/04 0541) Pulse Rate:  [92-97] 95  (12/04 0541) Resp:  [18] 18  (12/04 0541) BP: (116-167)/(51-74) 167/74 mmHg (12/04 0541) SpO2:  [93 %-99 %] 95 % (12/04 0541) Weight change:    PE: Abdomen distended especially above the navel, semi-taut  Lab Results: Results for orders placed during the hospital encounter of 03/09/11 (from the past 24 hour(s))  GLUCOSE, CAPILLARY     Status: Abnormal   Collection Time   03/16/11 11:11 AM      Component Value Range   Glucose-Capillary 184 (*) 70 - 99 (mg/dL)  GLUCOSE, CAPILLARY     Status: Abnormal   Collection Time   03/16/11  4:35 PM      Component Value Range   Glucose-Capillary 135 (*) 70 - 99 (mg/dL)  GLUCOSE, CAPILLARY     Status: Abnormal   Collection Time   03/16/11  8:25 PM      Component Value Range   Glucose-Capillary 155 (*) 70 - 99 (mg/dL)   Comment 1 Notify RN    GLUCOSE, CAPILLARY     Status: Abnormal   Collection Time   03/16/11 11:59 PM      Component Value Range   Glucose-Capillary 125 (*) 70 - 99 (mg/dL)   Comment 1 Notify RN    GLUCOSE, CAPILLARY     Status: Abnormal   Collection Time   03/17/11  3:56 AM      Component Value Range   Glucose-Capillary 135 (*) 70 - 99 (mg/dL)   Comment 1 Notify RN    CBC     Status: Abnormal   Collection Time   03/17/11  5:10 AM      Component Value Range   WBC 7.4  4.0 - 10.5 (K/uL)   RBC 3.15 (*) 4.22 - 5.81 (MIL/uL)   Hemoglobin 10.2 (*) 13.0 - 17.0 (g/dL)   HCT 78.2 (*) 95.6 - 52.0 (%)   MCV 95.6  78.0 - 100.0 (fL)   MCH 32.4  26.0 - 34.0 (pg)   MCHC 33.9  30.0 - 36.0 (g/dL)   RDW 21.3  08.6 - 57.8 (%)   Platelets 235  150 - 400 (K/uL)  BASIC METABOLIC PANEL      Status: Abnormal   Collection Time   03/17/11  5:10 AM      Component Value Range   Sodium 138  135 - 145 (mEq/L)   Potassium 2.6 (*) 3.5 - 5.1 (mEq/L)   Chloride 102  96 - 112 (mEq/L)   CO2 26  19 - 32 (mEq/L)   Glucose, Bld 122 (*) 70 - 99 (mg/dL)   BUN 13  6 - 23 (mg/dL)   Creatinine, Ser 4.69  0.50 - 1.35 (mg/dL)   Calcium 8.5  8.4 - 62.9 (mg/dL)   GFR calc non Af Amer 65 (*) >90 (mL/min)   GFR calc Af Amer 75 (*) >90 (mL/min)  GLUCOSE, CAPILLARY     Status: Abnormal   Collection Time   03/17/11  6:25 AM      Component Value Range   Glucose-Capillary 129 (*) 70 - 99 (mg/dL)   Comment 1 Notify RN    COMPREHENSIVE  METABOLIC PANEL     Status: Abnormal   Collection Time   03/17/11  7:58 AM      Component Value Range   Sodium 134 (*) 135 - 145 (mEq/L)   Potassium 2.4 (*) 3.5 - 5.1 (mEq/L)   Chloride 99  96 - 112 (mEq/L)   CO2 24  19 - 32 (mEq/L)   Glucose, Bld 179 (*) 70 - 99 (mg/dL)   BUN 12  6 - 23 (mg/dL)   Creatinine, Ser 1.61  0.50 - 1.35 (mg/dL)   Calcium 8.5  8.4 - 09.6 (mg/dL)   Total Protein 5.7 (*) 6.0 - 8.3 (g/dL)   Albumin 2.4 (*) 3.5 - 5.2 (g/dL)   AST 26  0 - 37 (U/L)   ALT 27  0 - 53 (U/L)   Alkaline Phosphatase 66  39 - 117 (U/L)   Total Bilirubin 0.4  0.3 - 1.2 (mg/dL)   GFR calc non Af Amer 71 (*) >90 (mL/min)   GFR calc Af Amer 82 (*) >90 (mL/min)    Studies/Results: KUB preliminary assessment continues to show diffuse colonic ileus   Assessment: Postoperative colonic ileus exacerbated by hypokalemia  Plan: 1. We'll place rectal tube and tried to have patient change positions frequent ly to mobilize gas 2. Aggressive replenishment of potassium which would be best accomplished by the IV route 3. Repeat KUB tomorrow 4. If no significant improvement tomorrow may consider decompressive colonoscopy.    [name] 03/17/2011, 10:28 AM

## 2011-03-17 NOTE — Progress Notes (Signed)
Subjective: 7 Days Post-Op Procedure(s) (LRB): EXTERNAL FIXATION LEG (Right)    Sitting in bedside chair this afternoon He is eating and does not appear to be in any acute distress Labs noted this morning as well. Low potassium levels noted as well as loading him levels. Patient states that he did not feel significantly better but is no worse. Denies any chest pain, shortness of breath, palpitations No nausea or vomiting are noted. Minimal flatus and small bowel movement were noted yesterday Blood pressures tend to be elevated in the morning and normalize after blood pressure medications have been given. Yesterday during the day they were relatively normal as well.  Objective: Current Vitals Blood pressure 167/74, pulse 95, temperature 98.9 F (37.2 C), temperature source Oral, resp. rate 18, height 5\' 7"  (1.702 m), weight 93.26 kg (205 lb 9.6 oz), SpO2 95.00%. Vital signs in last 24 hours: Temp:  [98 F (36.7 C)-98.9 F (37.2 C)] 98.9 F (37.2 C) (12/04 0541) Pulse Rate:  [92-97] 95  (12/04 0541) Resp:  [18] 18  (12/04 0541) BP: (116-167)/(51-74) 167/74 mmHg (12/04 0541) SpO2:  [93 %-99 %] 95 % (12/04 0541)  Intake/Output from previous day: 12/03 0701 - 12/04 0700 In: 1380 [P.O.:480; I.V.:900] Out: 1450 [Urine:1450]  LABS  Basename 03/17/11 0510  HGB 10.2*    Basename 03/17/11 0510  WBC 7.4  RBC 3.15*  HCT 30.1*  PLT 235    Basename 03/17/11 0758 03/17/11 0510  NA 134* 138  K 2.4* 2.6*  CL 99 102  CO2 24 26  BUN 12 13  CREATININE 1.03 1.11  GLUCOSE 179* 122*  CALCIUM 8.5 8.5   No results found for this basename: LABPT:2,INR:2 in the last 72 hours  Magnesium: 1.3  Physical Exam  Gen: No acute distress Lungs: Clear Cardiac: S1 and S2 Abd: Distended Ext: Fixator is stable, pin sites look good  Distal motor and sensory functions are intact  No acute changes are noted  Extremity is warm   Imaging Dg Abd 1 View  03/17/2011  *RADIOLOGY REPORT*   Clinical Data: Follow up colonic ileus  ABDOMEN - 1 VIEW  Comparison: 03/14/2011  Findings: Diffuse gaseous distention of the colon is again identified and appears unchanged from previous exam.  Surgical clips noted within the right abdomen and right iliac fossa.  No abnormal abdominal or pelvic calcifications.  IMPRESSION:  1.  No change in the appearance of bowel gas pattern.  Original Report Authenticated By: Rosealee Albee, [name]    Assessment/Plan: 7 Days Post-Op Procedure(s) (LRB): EXTERNAL FIXATION LEG (Right)  70 year old male status post fall  1. Right bicondylar tibial plateau fracture post external fixation postop day 7  Continue nonweightbearing  Daily pin care  Ice and elevate  Prospects for surgery on Thursday nonfavorable secondary to electrolyte imbalances as well as ileus  Will reevaluate tomorrow and see how patient responds to therapies thus far 2. Ileus: per GI  Getting ready to place rectal tube  Low potassium reading  Patient on minimal narcotics as well 3. electrolyte imbalance  Patient receiving IV magnesium, recheck potassium after magnesium on to see if potassium stabilizes  Hypokalemia is exacerbated by hypomagnesemia  Did stop his hydrochlorothiazide this morning as this is likely contributing to his work for imbalances. 4. Hypertension  Discontinued HCTZ secondary to #3, increase lisinopril to 10 mg daily to see if this will help control blood pressure as a single agent  May need to consider potassium sparing diuretic for additional blood pressure  control 5. TIA  Aspirin daily 6. type 2 diabetes  Blood sugar yesterday reflected fairly good control  Can we discontinue SSI as insulin administration can exacerbate electrolyte imbalances in particular potassium?  7. GERD 8. DVT/PE prophylaxis  Lovenox 40 mg subcutaneous daily 9. Pain   Continue low-dose narcotics   Tylenol as needed 10. Disposition  Continue per GI and medicine, greatly appreciate your  assistance  Will likely forego surgery this week his general anesthesia likely exacerbate GI issues  More probable for OR early next week  Mearl Latin, PA-C 03/17/2011, 12:27 PM

## 2011-03-17 NOTE — Progress Notes (Signed)
Subjective: Continuing to complain of abdominal pain, tolerating clear liquid diet so far without any vomiting or nausea.  Denies any chest pain or shortness of breath.  Objective: Vital signs in last 24 hours: Filed Vitals:   03/16/11 0640 03/16/11 1400 03/16/11 2133 03/17/11 0541  BP: 157/59 124/51 116/65 167/74  Pulse: 108 92 97 95  Temp: 98.4 F (36.9 C) 98.3 F (36.8 C) 98 F (36.7 C) 98.9 F (37.2 C)  TempSrc:  Oral    Resp: 16 18 18 18   Height:      Weight:      SpO2: 96% 99% 93% 95%   Weight change:   Intake/Output Summary (Last 24 hours) at 03/17/11 1320 Last data filed at 03/17/11 0700  Gross per 24 hour  Intake   1140 ml  Output   1050 ml  Net     90 ml   Physical Exam: General: Awake, Oriented, No acute distress. HEENT: EOMI. Neck: Supple CV: S1 and S2 Lungs: Clear to ascultation bilaterally Abdomen: Distended, hyperactive and high-pitched bowel sounds. Ext: Good pulses. Trace edema.  Right leg braced.  Lab Results:  [facility] 03/17/11 0758 03/17/11 0510  NA 134* 138  K 2.4* 2.6*  CL 99 102  CO2 24 26  GLUCOSE 179* 122*  BUN 12 13  CREATININE 1.03 1.11  CALCIUM 8.5 8.5  MG 1.3* --  PHOS -- --    Basename 03/17/11 0758  AST 26  ALT 27  ALKPHOS 66  BILITOT 0.4  PROT 5.7*  ALBUMIN 2.4*   No results found for this basename: LIPASE:2,AMYLASE:2 in the last 72 hours  Basename 03/17/11 0510  WBC 7.4  NEUTROABS --  HGB 10.2*  HCT 30.1*  MCV 95.6  PLT 235    Basename 03/14/11 2226  CKTOTAL 3120*  CKMB 5.6*  CKMBINDEX --  TROPONINI <0.30   No results found for this basename: POCBNP:3 in the last 72 hours No results found for this basename: DDIMER:2 in the last 72 hours No results found for this basename: HGBA1C:2 in the last 72 hours  Basename 03/14/11 2226  CHOL 100  HDL 37*  LDLCALC 43  TRIG 161  CHOLHDL 2.7  LDLDIRECT --   No results found for this basename: TSH,T4TOTAL,FREET3,T3FREE,THYROIDAB in the last 72 hours No  results found for this basename: VITAMINB12:2,FOLATE:2,FERRITIN:2,TIBC:2,IRON:2,RETICCTPCT:2 in the last 72 hours  Micro Results: No results found for this or any previous visit (from the past 240 hour(s)).  Studies/Results: Dg Abd 1 View  03/17/2011  *RADIOLOGY REPORT*  Clinical Data: Follow up colonic ileus  ABDOMEN - 1 VIEW  Comparison: 03/14/2011  Findings: Diffuse gaseous distention of the colon is again identified and appears unchanged from previous exam.  Surgical clips noted within the right abdomen and right iliac fossa.  No abnormal abdominal or pelvic calcifications.  IMPRESSION:  1.  No change in the appearance of bowel gas pattern.  Original Report Authenticated By: Rosealee Albee, [name]    Medications: I have reviewed the patient's current medications. Scheduled Meds:    . allopurinol  300 mg Oral Daily  . aspirin  325 mg Oral Daily  . docusate sodium  100 mg Oral BID  . enoxaparin (LOVENOX) injection  40 mg Subcutaneous Daily  . insulin aspart  0-15 Units Subcutaneous Q4H  . lisinopril  10 mg Oral Daily  . magnesium sulfate IVPB  2 g Intravenous Once  . multivitamins ther. w/minerals  1 tablet Oral Daily  . pantoprazole  40 mg  Oral Q1200  . polyethylene glycol  17 g Oral BID  . potassium chloride  10 mEq Intravenous Q1 Hr x 4  . potassium chloride SA      . potassium chloride  20 mEq Oral BID WC  . potassium chloride  40 mEq Oral Once  . senna-docusate  1 tablet Oral QHS  . DISCONTD: hydrochlorothiazide  25 mg Oral Daily  . DISCONTD: lisinopril  5 mg Oral Daily  . DISCONTD: magnesium oxide  400 mg Oral Daily  . DISCONTD: magnesium sulfate  2 g Intravenous Once  . DISCONTD: pantoprazole (PROTONIX) IV  40 mg Intravenous QHS  . DISCONTD: potassium chloride  20 mEq Oral TID   Continuous Infusions:    . dextrose 5 % and 0.45% NaCl 75 mL/hr at 03/17/11 0700   PRN Meds:.acetaminophen, ALPRAZolam, hydrALAZINE, HYDROcodone-acetaminophen, methocarbamol(ROBAXIN) IV,  methocarbamol, metoCLOPramide, ondansetron (ZOFRAN) IV  Assessment/Plan: 1.  Abdominal pain/colonic ileus.  Clear liquid diet by GI.  A rectal tube ordered by GI.  2.  Type 2 diabetes, stable on sliding scale insulin continue D5 half-normal saline solution until able to tolerate good by mouth intake. Patient's diabetic medications on hold until blood sugars improved.   3. Hypomagnesemia.  Replace when necessary.  May help with ileus.  4.  Hypokalemia.  EKG reviewed and no new changes noted.  Likely due to #3 replace when necessary.  Will recheck potassium at 4 PM and again tomorrow morning.  3. Closed fracture of tibial plateau, management as per orthopedic service.  Due to #1 surgery has been planned for next week.  4.  Hypertension, hydrochlorothiazide discontinued due to #4.  Continue lisinopril.  5.  Left hemispheric TIA, continue aspirin 325 mg and resume Plavix when feasible.  6.  Hyperlipidemia.  Holding rosuvastatin secondary to #1.  7.  GERD.  On PPI.  Thank you for the consult will continue to follow.  If the patient develops any new medical conditions or worsening medical condition will consider transitioning the patient to the hospitalist service.   LOS: 8 days  Scottie Stanish A, MD 03/17/2011, 1:20 PM

## 2011-03-17 NOTE — Progress Notes (Signed)
CRITICAL VALUE ALERT  Critical value received:  Potassium  Date of notification:  03/17/2011   Time of notification:  0730  Critical value read back:yes  Nurse who received alert:  Durenda Hurt, RN  MD notified (1st page):  Montez Morita, PA  Time of first page:  [phone]  MD notified (2nd page):  Time of second page:  Responding MD:  Montez Morita, PA  Time MD responded:  [phone]  New orders received, will repeat level STAT & give po supplements.  Tammy Sours

## 2011-03-17 NOTE — Progress Notes (Addendum)
Ortho brief note        called this am around 0745 re: low potassium of 2.6       Gave order to repeat BMET stat       40 Meq potassium chloride po x 1 and 20 Meq po TID with meals        Will see later this am        Very possible that hypokalemia exacerbating GI issues       Will d/c hctz as this likely contributing       Increase Lisinopril to 10 mg po daily       May need to add potassium sparing diuretic if unable to control with single agent vs adding supplemental potassium while continuing on HCTZ        Also possible that insulin administration contributing as well        From what the chart reads, pts blood sugar levels are doing very well and are between 122-184 over the last 24 hours, ? Restart po meds and holding SSI        Will discuss with medicine as to whether or not the patient should be on the medicine service given multiple medical comorbidities         Mearl Latin, PA-C 03/17/2011 4098

## 2011-03-17 NOTE — Progress Notes (Signed)
K+ 2.6 this morning. Value reported off to oncoming nurse. 1st shift nurse will call critical value to MD. Elly Modena

## 2011-03-17 NOTE — Progress Notes (Signed)
Physical Therapy Treatment Patient Details Name: Peter Nicholson MRN: [id number] DOB: Jan 20, 1941 Today's Date: 03/17/2011  PT Assessment/Plan  PT - Assessment/Plan Comments on Treatment Session: Pt not feeling well today, therefore treatment limited to chair to bed transfer. Pt transferring to 3500 unit for telemetry. Attempt increased ambulation pending progress. PT Plan: Discharge plan remains appropriate PT Frequency: Min 5X/week Follow Up Recommendations: Skilled nursing facility Equipment Recommended: Defer to next venue PT Goals  Acute Rehab PT Goals PT Goal Formulation: With patient Time For Goal Achievement: 2 weeks Pt will go Supine/Side to Sit: with supervision PT Goal: Supine/Side to Sit - Progress: Progressing toward goal PT Goal: Sit to Supine/Side - Progress: Progressing toward goal PT Transfer Goal: Bed to Chair/Chair to Bed - Progress: Progressing toward goal PT Goal: Ambulate - Progress: Other (comment) (Did not ambulate today) PT Goal: Up/Down Stairs - Progress: Other (comment) (Unable to attempt today secondary to medical complications) Additional Goals PT Goal: Additional Goal #1 - Progress: Progressing toward goal  PT Treatment Precautions/Restrictions  Precautions Precautions: Fall Restrictions Weight Bearing Restrictions: Yes RLE Weight Bearing: Non weight bearing Mobility (including Balance) Bed Mobility Bed Mobility: Yes Sit to Supine - Left: 4: Min assist;With rail;HOB flat Sit to Supine - Left Details (indicate cue type and reason): VC for sequencing into supine. Assist of RLE  Scooting to [facility]: 4: Min assist;With trapeze Scooting to [facility] Details (indicate cue type and reason): VC for proper technique towards head of bed. Assist of RLE  Transfers Transfers: Yes Sit to Stand: From chair/3-in-1;With upper extremity assist;3: Mod assist Sit to Stand Details (indicate cue type and reason): VC for hand placement. Assist for steadying into standing Stand to  Sit: 4: Min assist;To bed;With upper extremity assist Stand to Sit Details: VC for hand placement Stand Pivot Transfers: 3: Mod assist Stand Pivot Transfer Details (indicate cue type and reason): VC for sequencing. Assist for balance during transfer. Ambulation/Gait Ambulation/Gait: No (pt not feeling well today, low potassium)    Exercise    End of Session PT - End of Session Equipment Utilized During Treatment: Gait belt Activity Tolerance: Treatment limited secondary to medical complications (Comment);Patient limited by fatigue (low potassium) Patient left: with call bell in reach;in bed Nurse Communication: Mobility status for ambulation General Behavior During Session: [facility] for tasks performed Cognition: [facility] for tasks performed  [name] 03/17/2011, 3:16 PM  03/17/2011 [name] DPT PAGER: [phone] OFFICE: [phone]

## 2011-03-18 ENCOUNTER — Inpatient Hospital Stay (HOSPITAL_COMMUNITY): Payer: Medicare Other

## 2011-03-18 LAB — BASIC METABOLIC PANEL
BUN: 9 mg/dL (ref 6–23)
CO2: 25 mEq/L (ref 19–32)
Chloride: 101 mEq/L (ref 96–112)
GFR calc Af Amer: >90 mL/min (ref >90)
Potassium: 2.6 mEq/L — CL (ref 3.5–5.1)

## 2011-03-18 LAB — GLUCOSE, CAPILLARY
Glucose-Capillary: 166 mg/dL — ABNORMAL HIGH (ref 70–99)
Glucose-Capillary: 154 mg/dL — ABNORMAL HIGH (ref 70–99)
Glucose-Capillary: 142 mg/dL — ABNORMAL HIGH (ref 70–99)

## 2011-03-18 LAB — CBC
HCT: 30.5 % — ABNORMAL LOW (ref 39.0–52.0)
Hemoglobin: 10.2 g/dL — ABNORMAL LOW (ref 13.0–17.0)
RBC: 3.19 MIL/uL — ABNORMAL LOW (ref 4.22–5.81)

## 2011-03-18 LAB — MAGNESIUM: Magnesium: 1.7 mg/dL (ref 1.5–2.5)

## 2011-03-18 MED ORDER — MAGNESIUM SULFATE 40 MG/ML IJ SOLN
2.0000 g | Freq: Once | INTRAMUSCULAR | Status: AC
  Administered 2011-03-18: 2 g via INTRAVENOUS
  Filled 2011-03-18: qty 50

## 2011-03-18 MED ORDER — POTASSIUM CHLORIDE 10 MEQ/100ML IV SOLN
10.0000 meq | INTRAVENOUS | Status: AC
  Administered 2011-03-18 (×5): 10 meq via INTRAVENOUS
  Filled 2011-03-18 (×5): qty 100

## 2011-03-18 NOTE — Progress Notes (Signed)
Physical Therapy Treatment Patient Details Name: BALEN WOOLUM MRN: [id number] DOB: [birth year] Today's Date: 03/18/2011  PT Assessment/Plan  PT - Assessment/Plan Comments on Treatment Session: Did well but still easily fatigued today; noted potassium to be low today but pt asymptomatic and repletion began this morning. Goals addressed and remain appropriate for this pt, will continue to follow and readdress goals in another 2 weeks. If pt has surgery before then we will need new PT orders.  PT Plan: Discharge plan remains appropriate PT Frequency: Min 5X/week Follow Up Recommendations: Skilled nursing facility Equipment Recommended: Defer to next venue PT Goals  Acute Rehab PT Goals PT Goal: Supine/Side to Sit - Progress: Progressing toward goal PT Goal: Sit to Supine/Side - Progress: Progressing toward goal PT Transfer Goal: Bed to Chair/Chair to Bed - Progress: Progressing toward goal PT Goal: Ambulate - Progress: Progressing toward goal PT Goal: Up/Down Stairs - Progress: Not met Additional Goals PT Goal: Additional Goal #1 - Progress: Progressing toward goal  PT Treatment Precautions/Restrictions  Precautions Precautions: Fall Restrictions Weight Bearing Restrictions: Yes RLE Weight Bearing: Non weight bearing Mobility (including Balance) Bed Mobility Supine to Sit: 4: Min assist Supine to Sit Details (indicate cue type and reason): pt using trapeze to scoot self; some sequencing cues; pt pushes through needs cues for controlled breathing and not to rush Sitting - Scoot to Edge of Bed: 4: Min assist Sitting - Scoot to [place] of Bed Details (indicate cue type and reason): minA to assist with scooting leg out and maintaining NWB Transfers Sit to Stand: 1: +2 Total assist;From bed;From elevated surface Sit to Stand Details (indicate cue type and reason): +2totalpt70% with cues for sequencing and hand placement; assist to steady self in standing so as to bring his RUE to RW  Stand  to Sit: 4: Min assist;To chair/3-in-1 Stand to Sit Details: min sequencing cues Ambulation/Gait Ambulation/Gait Assistance: 4: Min assist Ambulation/Gait Assistance Details (indicate cue type and reason): pt able to hop 5 steps with RW maintaining NWB throughout; easily fatigued, cues for proper technique with RW; c/o RUE being sore from pushing down so much on RW Ambulation Distance (Feet): 5 Feet Assistive device: Rolling walker Gait Pattern: Step-to pattern;Trunk flexed    Exercise  Total Joint Exercises Ankle Circles/Pumps: AROM;Right;Left;10 reps;Supine End of Session PT - End of Session Equipment Utilized During Treatment: Gait belt Activity Tolerance: Patient tolerated treatment well;Patient limited by fatigue Patient left: in chair;with call bell in reach (RN present) Nurse Communication: Mobility status for transfers;Mobility status for ambulation General Behavior During Session: [facility] for tasks performed Cognition: [facility] for tasks performed  [facility] [name] 03/18/2011, 1:47 PM

## 2011-03-18 NOTE — Progress Notes (Signed)
Subjective: Abdomen feels less distended, decreased abdominal pain. Is starting to feel hungry.  Objective: Vital signs in last 24 hours: Temp:  [98.2 F (36.8 C)-99.1 F (37.3 C)] 98.9 F (37.2 C) (12/05 0437) Pulse Rate:  [88-102] 90  (12/05 0437) Resp:  [19-22] 20  (12/05 0437) BP: (135-165)/(71-77) 135/71 mmHg (12/05 0437) SpO2:  [94 %-98 %] 94 % (12/05 0437) Weight:  [97.3 kg (214 lb 8.1 oz)] 214 lb 8.1 oz (97.3 kg) (12/05 0437) Weight change:  Last BM Date: 03/15/11  Intake/Output from previous day: 12/04 0701 - 12/05 0700 In: 1220 [P.O.:320; I.V.:900] Out: 851 [Urine:850; Stool:1] Total I/O In: -  Out: 250 [Urine:250]   Physical Exam: General: Alert, awake, oriented x3, in no acute distress. HEENT: No bruits, no goiter. Heart: Regular rate and rhythm, without murmurs, rubs, gallops. Lungs: Clear to auscultation bilaterally. Abdomen: Softer, bowel sounds present. Extremities: Trace lower extremity edema.     Lab Results: Basic Metabolic Panel:  Basename 03/18/11 0514 03/17/11 2048  NA 135 134*  K 2.6* 2.6*  CL 101 101  CO2 25 25  GLUCOSE 139* 156*  BUN 9 11  CREATININE 0.99 0.99  CALCIUM 8.6 8.5  MG 1.7 1.9  PHOS -- --   Liver Function Tests:  [facility] 03/17/11 0758  AST 26  ALT 27  ALKPHOS 66  BILITOT 0.4  PROT 5.7*  ALBUMIN 2.4*   CBC:  Basename 03/18/11 0514 03/17/11 0510  WBC 7.4 7.4  NEUTROABS -- --  HGB 10.2* 10.2*  HCT 30.5* 30.1*  MCV 95.6 95.6  PLT 255 235   CBG:  Basename 03/18/11 0842 03/18/11 0434 03/18/11 03/17/11 2213 03/17/11 1613 03/17/11 1209  GLUCAP 142* 130* 179* 166* 154* 166*    Studies/Results: Dg Abd 1 View  03/18/2011  *RADIOLOGY REPORT*  Clinical Data: Abdominal distention, postop ileus.  ABDOMEN - 1 VIEW  Comparison: 03/17/2011  Findings: Gaseous distention of the colon again noted, not significantly changed compatible with ileus.  No free air. Surgical clips in the abdomen.  No acute bony abnormality.   IMPRESSION: Stable adynamic ileus pattern.  Original Report Authenticated By: Cyndie Chime, [name]   Dg Abd 1 View  03/17/2011  *RADIOLOGY REPORT*  Clinical Data: Follow up colonic ileus  ABDOMEN - 1 VIEW  Comparison: 03/14/2011  Findings: Diffuse gaseous distention of the colon is again identified and appears unchanged from previous exam.  Surgical clips noted within the right abdomen and right iliac fossa.  No abnormal abdominal or pelvic calcifications.  IMPRESSION:  1.  No change in the appearance of bowel gas pattern.  Original Report Authenticated By: Rosealee Albee, [name]    Medications: Scheduled Meds:   . allopurinol  300 mg Oral Daily  . aspirin  325 mg Oral Daily  . docusate sodium  100 mg Oral BID  . enoxaparin (LOVENOX) injection  40 mg Subcutaneous Daily  . insulin aspart  0-15 Units Subcutaneous Q4H  . lisinopril  10 mg Oral Daily  . magnesium sulfate IVPB  2 g Intravenous Once  . multivitamins ther. w/minerals  1 tablet Oral Daily  . pantoprazole  40 mg Oral Q1200  . polyethylene glycol  17 g Oral BID  . potassium chloride  10 mEq Intravenous Q1 Hr x 4  . potassium chloride  10 mEq Intravenous Q1 Hr x 4  . potassium chloride  10 mEq Intravenous Q1 Hr x 5  . potassium chloride SA      . potassium chloride  20 mEq Oral BID WC  . senna-docusate  1 tablet Oral QHS  . DISCONTD: magnesium oxide  400 mg Oral Daily  . DISCONTD: magnesium sulfate  2 g Intravenous Once  . DISCONTD: pantoprazole (PROTONIX) IV  40 mg Intravenous QHS  . DISCONTD: potassium chloride  20 mEq Oral TID   Continuous Infusions:   . dextrose 5 % and 0.45% NaCl 75 mL/hr at 03/18/11 0432   PRN Meds:.acetaminophen, ALPRAZolam, hydrALAZINE, HYDROcodone-acetaminophen, methocarbamol(ROBAXIN) IV, methocarbamol, metoCLOPramide, ondansetron (ZOFRAN) IV  Assessment/Plan:  Principal Problem:  *Closed fracture of tibial plateau Active Problems:  DIABETES MELLITUS  HYPERCHOLESTEROLEMIA  ANXIETY   HYPERTENSION  CEREBROVASCULAR DISEASE  VENOUS INSUFFICIENCY  GERD  Ileus  TIA (transient ischemic attack)  Hypokalemia  Hypomagnesemia   #1 tibial fracture: Management as per orthopedics.  #2 abdominal pain/colonic ileus: Seems to be improving. Appreciate GI recommendations. Diet has been upgraded to a full liquid diet. Rectal tube has been discontinued. Repeat KUB ordered for the morning.  #3 hypokalemia: Potassium today is 2.6. Have ordered repletion. We'll recheck in the morning.  #4 hypomagnesemia: Is 1.7 today. Even though within normal limit, I would prefer his magnesium to be greater than 2 especially given his  low potassium. We'll go ahead and give him 2 more grams of IV magnesium.  #5 hypertension: Hydrochlorothiazide discontinued secondary to hypokalemia. Well controlled.  #6 TIA continue aspirin.   LOS: 9 days   HERNANDEZ ACOSTA,ESTELA Pager: [phone] 03/18/2011, 10:31 AM

## 2011-03-18 NOTE — Progress Notes (Signed)
CRITICAL VALUE ALERT  Critical value received:  K + 2.6  Date of notification:  03/17/11  Time of notification:  2003  Critical value read back:yes  Nurse who received alert:  Leonia Reeves  MD notified (1st page):  Dr.Vega  Time of first page:  2013  MD notified (2nd page):  Time of second page:  Responding MD:  Dr.Vega  Time MD responded:  2019

## 2011-03-18 NOTE — Progress Notes (Signed)
CRITICAL VALUE ALERT  Critical value received:  K+ 2.6  Date of notification:  03/17/11  Time of notification:  2148  Critical value read back:yes  Nurse who received alert:  Leonia Reeves   MD notified (1st page):  Dr.Vega  Time of first page:  2214  MD notified (2nd page):  Time of second page:  Responding MD:  Dr.Vega  Time MD responded:  2219

## 2011-03-18 NOTE — Progress Notes (Addendum)
CRITICAL VALUE ALERT  Critical value received:  K=2.6  Date of notification: 03/18/11  Time of notification:  7:10 AM  Critical value read back:yes  Nurse who received alert:  Ernesto Rutherford  MD notified (1st page):  Dr.Reddy  Time of first page: 7:11 AM  2nd Page to Dr. Chauncey Fischer at 7:19 AM

## 2011-03-18 NOTE — Progress Notes (Signed)
Subjective: 8 Days Post-Op Procedure(s) (LRB): EXTERNAL FIXATION LEG (Right) Belly feeling better Starting to get hungry R Leg stable  Objective: Current Vitals Blood pressure 135/71, pulse 90, temperature 98.9 F (37.2 C), temperature source Oral, resp. rate 20, height 5\' 7"  (1.702 m), weight 97.3 kg (214 lb 8.1 oz), SpO2 94.00%. Vital signs in last 24 hours: Temp:  [98.2 F (36.8 C)-99.1 F (37.3 C)] 98.9 F (37.2 C) (12/05 0437) Pulse Rate:  [88-102] 90  (12/05 0437) Resp:  [19-22] 20  (12/05 0437) BP: (135-165)/(71-77) 135/71 mmHg (12/05 0437) SpO2:  [94 %-98 %] 94 % (12/05 0437) Weight:  [97.3 kg (214 lb 8.1 oz)] 214 lb 8.1 oz (97.3 kg) (12/05 0437)  Intake/Output from previous day: 12/04 0701 - 12/05 0700 In: 1220 [P.O.:320; I.V.:900] Out: 851 [Urine:850; Stool:1]  LABS  Basename 03/18/11 0514 03/17/11 0510  HGB 10.2* 10.2*    Basename 03/18/11 0514 03/17/11 0510  WBC 7.4 7.4  RBC 3.19* 3.15*  HCT 30.5* 30.1*  PLT 255 235    Basename 03/18/11 0514 03/17/11 2048  NA 135 134*  K 2.6* 2.6*  CL 101 101  CO2 25 25  BUN 9 11  CREATININE 0.99 0.99  GLUCOSE 139* 156*  CALCIUM 8.6 8.5   No results found for this basename: LABPT:2,INR:2 in the last 72 hours  Mg: 1.7  Physical Exam  Gen:NAD Lungs:Clear Cardiac:S1 and S2 Abd: decreased distension, softer, + BS Ext: Ex fix stable  No change in exam   Imaging Dg Abd 1 View  03/18/2011  *RADIOLOGY REPORT*  Clinical Data: Abdominal distention, postop ileus.  ABDOMEN - 1 VIEW  Comparison: 03/17/2011  Findings: Gaseous distention of the colon again noted, not significantly changed compatible with ileus.  No free air. Surgical clips in the abdomen.  No acute bony abnormality.  IMPRESSION: Stable adynamic ileus pattern.  Original Report Authenticated By: Cyndie Chime, [name]   Dg Abd 1 View  03/17/2011  *RADIOLOGY REPORT*  Clinical Data: Follow up colonic ileus  ABDOMEN - 1 VIEW  Comparison: 03/14/2011   Findings: Diffuse gaseous distention of the colon is again identified and appears unchanged from previous exam.  Surgical clips noted within the right abdomen and right iliac fossa.  No abnormal abdominal or pelvic calcifications.  IMPRESSION:  1.  No change in the appearance of bowel gas pattern.  Original Report Authenticated By: Rosealee Albee, [name]    Assessment/Plan: 8 Days Post-Op Procedure(s) (LRB): EXTERNAL FIXATION LEG (Right)  70 y/o male s/p fall  1. Complex R tibial plateau fx s/p exfix  Hold on OR tomorrow as GI issues improving slowly  Plan for OR early next week if possible  NWB  Toe and ankle motion as tolerated  Daily pin care and ACE wrap 2. Ileus  Per GI  Improving   Rectal tube d/c'd 3. Hypomagnesemia    Resolved  But going to give more Mg as it is low normal and K is still low 4. Hypokalemia  Replete per IM 6. HTN  Doing well on lisinopril 7. DM  Stable  Per IM 8.  TIA  ASA 9. DVT/PE prophylaxis  lovenox daily 10. GERD  On prophylaxis 11. Pain   Low dose narcs 12. FEN  Full liquid per GI 13. Activity  PT/OT  NWB R leg 14. Dispo  Appreciate IM and GI  lyte correction  OR next week for ORIF R tibial plateau   Mearl Latin, PA-C 03/18/2011, 10:47 AM

## 2011-03-18 NOTE — Progress Notes (Signed)
[facility] Progress Note  Subjective: Patient's abdomen feel subjectively much less distended with less discomfort. Starting to get in appetite  Objective: Vital signs in last 24 hours: Temp:  [98.2 F (36.8 C)-99.1 F (37.3 C)] 98.9 F (37.2 C) (12/05 0437) Pulse Rate:  [88-102] 90  (12/05 0437) Resp:  [19-22] 20  (12/05 0437) BP: (135-165)/(71-77) 135/71 mmHg (12/05 0437) SpO2:  [94 %-98 %] 94 % (12/05 0437) Weight:  [97.3 kg (214 lb 8.1 oz)] 214 lb 8.1 oz (97.3 kg) (12/05 0437) Weight change:    PE: Abdomen significantly softer than yesterday  Lab Results: Results for orders placed during the hospital encounter of 03/09/11 (from the past 24 hour(s))  GLUCOSE, CAPILLARY     Status: Abnormal   Collection Time   03/17/11 12:09 PM      Component Value Range   Glucose-Capillary 166 (*) 70 - 99 (mg/dL)  GLUCOSE, CAPILLARY     Status: Abnormal   Collection Time   03/17/11  4:13 PM      Component Value Range   Glucose-Capillary 154 (*) 70 - 99 (mg/dL)   Comment 1 Notify RN     Comment 2 Documented in Chart    BASIC METABOLIC PANEL     Status: Abnormal   Collection Time   03/17/11  6:57 PM      Component Value Range   Sodium 134 (*) 135 - 145 (mEq/L)   Potassium 2.6 (*) 3.5 - 5.1 (mEq/L)   Chloride 100  96 - 112 (mEq/L)   CO2 26  19 - 32 (mEq/L)   Glucose, Bld 165 (*) 70 - 99 (mg/dL)   BUN 11  6 - 23 (mg/dL)   Creatinine, Ser 1.61  0.50 - 1.35 (mg/dL)   Calcium 8.5  8.4 - 09.6 (mg/dL)   GFR calc non Af Amer 71 (*) >90 (mL/min)   GFR calc Af Amer 82 (*) >90 (mL/min)  BASIC METABOLIC PANEL     Status: Abnormal   Collection Time   03/17/11  8:48 PM      Component Value Range   Sodium 134 (*) 135 - 145 (mEq/L)   Potassium 2.6 (*) 3.5 - 5.1 (mEq/L)   Chloride 101  96 - 112 (mEq/L)   CO2 25  19 - 32 (mEq/L)   Glucose, Bld 156 (*) 70 - 99 (mg/dL)   BUN 11  6 - 23 (mg/dL)   Creatinine, Ser 0.45  0.50 - 1.35 (mg/dL)   Calcium 8.5  8.4 - 40.9 (mg/dL)   GFR calc  non Af Amer 80 (*) >90 (mL/min)   GFR calc Af Amer >90  >90 (mL/min)  MAGNESIUM     Status: Normal   Collection Time   03/17/11  8:48 PM      Component Value Range   Magnesium 1.9  1.5 - 2.5 (mg/dL)  GLUCOSE, CAPILLARY     Status: Abnormal   Collection Time   03/17/11 10:13 PM      Component Value Range   Glucose-Capillary 166 (*) 70 - 99 (mg/dL)   Comment 1 Notify RN    GLUCOSE, CAPILLARY     Status: Abnormal   Collection Time   03/18/11 12:00 AM      Component Value Range   Glucose-Capillary 179 (*) 70 - 99 (mg/dL)  GLUCOSE, CAPILLARY     Status: Abnormal   Collection Time   03/18/11  4:34 AM      Component Value Range   Glucose-Capillary 130 (*) 70 -  99 (mg/dL)   Comment 1 Notify RN    BASIC METABOLIC PANEL     Status: Abnormal   Collection Time   03/18/11  5:14 AM      Component Value Range   Sodium 135  135 - 145 (mEq/L)   Potassium 2.6 (*) 3.5 - 5.1 (mEq/L)   Chloride 101  96 - 112 (mEq/L)   CO2 25  19 - 32 (mEq/L)   Glucose, Bld 139 (*) 70 - 99 (mg/dL)   BUN 9  6 - 23 (mg/dL)   Creatinine, Ser 1.61  0.50 - 1.35 (mg/dL)   Calcium 8.6  8.4 - 09.6 (mg/dL)   GFR calc non Af Amer 80 (*) >90 (mL/min)   GFR calc Af Amer >90  >90 (mL/min)  MAGNESIUM     Status: Normal   Collection Time   03/18/11  5:14 AM      Component Value Range   Magnesium 1.7  1.5 - 2.5 (mg/dL)  CBC     Status: Abnormal   Collection Time   03/18/11  5:14 AM      Component Value Range   WBC 7.4  4.0 - 10.5 (K/uL)   RBC 3.19 (*) 4.22 - 5.81 (MIL/uL)   Hemoglobin 10.2 (*) 13.0 - 17.0 (g/dL)   HCT 04.5 (*) 40.9 - 52.0 (%)   MCV 95.6  78.0 - 100.0 (fL)   MCH 32.0  26.0 - 34.0 (pg)   MCHC 33.4  30.0 - 36.0 (g/dL)   RDW 81.1  91.4 - 78.2 (%)   Platelets 255  150 - 400 (K/uL)  GLUCOSE, CAPILLARY     Status: Abnormal   Collection Time   03/18/11  8:42 AM      Component Value Range   Glucose-Capillary 142 (*) 70 - 99 (mg/dL)   Comment 1 Notify RN     Comment 2 Documented in Chart       Studies/Results: Dg Abd 1 View  03/17/2011  *RADIOLOGY REPORT*  Clinical Data: Follow up colonic ileus  ABDOMEN - 1 VIEW  Comparison: 03/14/2011  Findings: Diffuse gaseous distention of the colon is again identified and appears unchanged from previous exam.  Surgical clips noted within the right abdomen and right iliac fossa.  No abnormal abdominal or pelvic calcifications.  IMPRESSION:  1.  No change in the appearance of bowel gas pattern.  Original Report Authenticated By: Rosealee Albee, [name]      Assessment: 1. Colonic ileus, improved  Plan: 1. DC rectal tube 2. Full liquid diet 3. Progressive replenish potassium, will leave this to internal medicine 4. Recheck KUB tomorrow    Degan Hanser C 03/18/2011, 8:54 AM

## 2011-03-19 ENCOUNTER — Inpatient Hospital Stay (HOSPITAL_COMMUNITY): Payer: Medicare Other

## 2011-03-19 ENCOUNTER — Encounter (HOSPITAL_COMMUNITY)
Admission: EM | Disposition: A | Discharge status: Skilled Nursing Facility | Payer: Self-pay | Source: Home / Self Care | Attending: Orthopedic Surgery

## 2011-03-19 LAB — GLUCOSE, CAPILLARY
Glucose-Capillary: 166 mg/dL — ABNORMAL HIGH (ref 70–99)
Glucose-Capillary: 132 mg/dL — ABNORMAL HIGH (ref 70–99)
Glucose-Capillary: 167 mg/dL — ABNORMAL HIGH (ref 70–99)

## 2011-03-19 LAB — BASIC METABOLIC PANEL
BUN: 8 mg/dL (ref 6–23)
Chloride: 105 mEq/L (ref 96–112)
Creatinine, Ser: 1.08 mg/dL (ref 0.50–1.35)
GFR calc non Af Amer: 67 mL/min — ABNORMAL LOW (ref >90)
Glucose, Bld: 133 mg/dL — ABNORMAL HIGH (ref 70–99)
Potassium: 2.7 mEq/L — CL (ref 3.5–5.1)

## 2011-03-19 LAB — CBC
HCT: 27.8 % — ABNORMAL LOW (ref 39.0–52.0)
Hemoglobin: 9.3 g/dL — ABNORMAL LOW (ref 13.0–17.0)
MCH: 32.3 pg (ref 26.0–34.0)
MCHC: 33.5 g/dL (ref 30.0–36.0)
RDW: 12.9 % (ref 11.5–15.5)

## 2011-03-19 SURGERY — OPEN REDUCTION INTERNAL FIXATION (ORIF) TIBIAL PLATEAU
Anesthesia: General | Laterality: Right

## 2011-03-19 MED ORDER — DIPHENHYDRAMINE-ZINC ACETATE 2-0.1 % EX CREA
TOPICAL_CREAM | Freq: Two times a day (BID) | CUTANEOUS | Status: DC | PRN
  Filled 2011-03-19: qty 28.4

## 2011-03-19 MED ORDER — POTASSIUM CHLORIDE CRYS ER 20 MEQ PO TBCR
20.0000 meq | EXTENDED_RELEASE_TABLET | Freq: Every day | ORAL | Status: DC
  Administered 2011-03-20 – 2011-03-24 (×5): 20 meq via ORAL
  Filled 2011-03-19 (×6): qty 1

## 2011-03-19 MED ORDER — MAGNESIUM OXIDE 400 MG PO TABS
400.0000 mg | ORAL_TABLET | Freq: Three times a day (TID) | ORAL | Status: AC
  Administered 2011-03-19 – 2011-03-25 (×20): 400 mg via ORAL
  Filled 2011-03-19 (×21): qty 1

## 2011-03-19 MED ORDER — POTASSIUM CHLORIDE CRYS ER 20 MEQ PO TBCR
40.0000 meq | EXTENDED_RELEASE_TABLET | ORAL | Status: AC
  Administered 2011-03-19 (×3): 40 meq via ORAL
  Filled 2011-03-19 (×3): qty 2

## 2011-03-19 NOTE — Progress Notes (Signed)
Subjective: Abdomen feels less distended, decreased abdominal pain. Is starting to feel hungry.  Objective: Vital signs in last 24 hours: Temp:  [98 F (36.7 C)-99.4 F (37.4 C)] 98 F (36.7 C) (12/06 1349) Pulse Rate:  [83-95] 88  (12/06 1349) Resp:  [19-22] 20  (12/06 1349) BP: (104-165)/(65-79) 134/71 mmHg (12/06 1349) SpO2:  [95 %-99 %] 99 % (12/06 1349) Weight:  [96.7 kg (213 lb 3 oz)] 213 lb 3 oz (96.7 kg) (12/06 0411) Weight change: -0.6 kg (-1 lb 5.2 oz) Last BM Date: 03/19/11  Intake/Output from previous day: 12/05 0701 - 12/06 0700 In: 1250 [I.V.:1000; IV Piggyback:250] Out: 1050 [Urine:1050] Total I/O In: 1200 [P.O.:600; I.V.:600] Out: 1 [Stool:1]   Physical Exam: General: Alert, awake, oriented x3, in no acute distress. HEENT: No bruits, no goiter. Heart: Regular rate and rhythm, without murmurs, rubs, gallops. Lungs: Clear to auscultation bilaterally. Abdomen: Softer, bowel sounds present. Extremities: Trace lower extremity edema.     Lab Results: Basic Metabolic Panel:  Basename 03/19/11 0630 03/18/11 0514  NA 137 135  K 2.7* 2.6*  CL 105 101  CO2 23 25  GLUCOSE 133* 139*  BUN 8 9  CREATININE 1.08 0.99  CALCIUM 8.2* 8.6  MG 1.7 1.7  PHOS -- --   Liver Function Tests:  [facility] 03/17/11 0758  AST 26  ALT 27  ALKPHOS 66  BILITOT 0.4  PROT 5.7*  ALBUMIN 2.4*   CBC:  Basename 03/19/11 0630 03/18/11 0514  WBC 7.5 7.4  NEUTROABS -- --  HGB 9.3* 10.2*  HCT 27.8* 30.5*  MCV 96.5 95.6  PLT 281 255   CBG:  Basename 03/19/11 1156 03/19/11 0754 03/19/11 0421 03/19/11 0018 03/18/11 2018 03/18/11 1556  GLUCAP 167* 132* 166* 132* 179* 167*    Studies/Results: Dg Abd 1 View  03/18/2011  *RADIOLOGY REPORT*  Clinical Data: Abdominal distention, postop ileus.  ABDOMEN - 1 VIEW  Comparison: 03/17/2011  Findings: Gaseous distention of the colon again noted, not significantly changed compatible with ileus.  No free air. Surgical clips in the  abdomen.  No acute bony abnormality.  IMPRESSION: Stable adynamic ileus pattern.  Original Report Authenticated By: Cyndie Chime, [name]   Dg Abd Portable 1v  03/19/2011  *RADIOLOGY REPORT*  Clinical Data: Abdominal pain, follow up of ileus  ABDOMEN - 1 VIEW  Comparison: Abdomen films of 03/18/2011  Findings:  A portable supine view of the abdomen shows slight gaseous distention of the colon.  No bowel obstruction is seen, and this pattern is most consistent with ileus.  Surgical clips overlie the right abdomen.  There are degenerative changes in the lower lumbar spine.  IMPRESSION:  No change in probable mild ileus.  No definite obstruction.  Original Report Authenticated By: Juline Patch, [name]    Medications: Scheduled Meds:    . allopurinol  300 mg Oral Daily  . aspirin  325 mg Oral Daily  . docusate sodium  100 mg Oral BID  . enoxaparin (LOVENOX) injection  40 mg Subcutaneous Daily  . insulin aspart  0-15 Units Subcutaneous Q4H  . lisinopril  10 mg Oral Daily  . magnesium oxide  400 mg Oral TID  . magnesium sulfate IVPB  2 g Intravenous Once  . multivitamins ther. w/minerals  1 tablet Oral Daily  . pantoprazole  40 mg Oral Q1200  . polyethylene glycol  17 g Oral BID  . potassium chloride  10 mEq Intravenous Q1 Hr x 5  . potassium chloride  20 mEq Oral BID WC  . potassium chloride  20 mEq Oral Daily  . potassium chloride  40 mEq Oral Q4H  . senna-docusate  1 tablet Oral QHS   Continuous Infusions:    . dextrose 5 % and 0.45% NaCl 75 mL/hr at 03/19/11 1400   PRN Meds:.acetaminophen, ALPRAZolam, hydrALAZINE, HYDROcodone-acetaminophen, methocarbamol(ROBAXIN) IV, methocarbamol, metoCLOPramide, ondansetron (ZOFRAN) IV  Assessment/Plan:  Principal Problem:  *Closed fracture of tibial plateau Active Problems:  DIABETES MELLITUS  HYPERCHOLESTEROLEMIA  ANXIETY  HYPERTENSION  CEREBROVASCULAR DISEASE  VENOUS INSUFFICIENCY  GERD  Ileus  TIA (transient ischemic attack)   Hypokalemia  Hypomagnesemia   #1 tibial fracture: Management as per orthopedics.  #2 abdominal pain/post operative colonic ileus: Seems to be improving. Appreciate GI recommendations. Diet has been advanced.  #3 hypokalemia: Continue to replete. Have also started him on daily supplementation.  #4 hypomagnesemia: Have placed him on daily supplementation  #5 hypertension: Hydrochlorothiazide discontinued secondary to hypokalemia. Well controlled.  #6 TIA continue aspirin.   LOS: 10 days   Chaya Jan Pager: [phone] 03/19/2011, 2:23 PM

## 2011-03-19 NOTE — Progress Notes (Signed)
[facility] Progress Note  Subjective: Abdomen feels better, passing more flatus and stool, wants to eat  Objective: Vital signs in last 24 hours: Temp:  [98.5 F (36.9 C)-99.4 F (37.4 C)] 99.4 F (37.4 C) (12/06 0411) Pulse Rate:  [83-95] 95  (12/06 0411) Resp:  [19-22] 22  (12/06 0411) BP: (104-165)/(65-79) 165/79 mmHg (12/06 0411) SpO2:  [95 %-98 %] 98 % (12/06 0411) Weight:  [96.7 kg (213 lb 3 oz)] 213 lb 3 oz (96.7 kg) (12/06 0411) Weight change: -0.6 kg (-1 lb 5.2 oz)   PE: Abdomen distended but soft with active bowel sounds  Lab Results: Results for orders placed during the hospital encounter of 03/09/11 (from the past 24 hour(s))  GLUCOSE, CAPILLARY     Status: Abnormal   Collection Time   03/18/11  8:42 AM      Component Value Range   Glucose-Capillary 142 (*) 70 - 99 (mg/dL)   Comment 1 Notify RN     Comment 2 Documented in Chart    GLUCOSE, CAPILLARY     Status: Abnormal   Collection Time   03/18/11  3:56 PM      Component Value Range   Glucose-Capillary 167 (*) 70 - 99 (mg/dL)  GLUCOSE, CAPILLARY     Status: Abnormal   Collection Time   03/18/11  8:18 PM      Component Value Range   Glucose-Capillary 179 (*) 70 - 99 (mg/dL)   Comment 1 Notify RN    GLUCOSE, CAPILLARY     Status: Abnormal   Collection Time   03/19/11 12:18 AM      Component Value Range   Glucose-Capillary 132 (*) 70 - 99 (mg/dL)   Comment 1 Notify RN    GLUCOSE, CAPILLARY     Status: Abnormal   Collection Time   03/19/11  4:21 AM      Component Value Range   Glucose-Capillary 166 (*) 70 - 99 (mg/dL)   Comment 1 Notify RN    CBC     Status: Abnormal   Collection Time   03/19/11  6:30 AM      Component Value Range   WBC 7.5  4.0 - 10.5 (K/uL)   RBC 2.88 (*) 4.22 - 5.81 (MIL/uL)   Hemoglobin 9.3 (*) 13.0 - 17.0 (g/dL)   HCT 40.9 (*) 81.1 - 52.0 (%)   MCV 96.5  78.0 - 100.0 (fL)   MCH 32.3  26.0 - 34.0 (pg)   MCHC 33.5  30.0 - 36.0 (g/dL)   RDW 91.4  78.2 - 95.6 (%)   Platelets 281  150 - 400 (K/uL)  GLUCOSE, CAPILLARY     Status: Abnormal   Collection Time   03/19/11  7:54 AM      Component Value Range   Glucose-Capillary 132 (*) 70 - 99 (mg/dL)   Comment 1 Notify RN      Studies/Results: Dg Abd 1 View  03/18/2011  *RADIOLOGY REPORT*  Clinical Data: Abdominal distention, postop ileus.  ABDOMEN - 1 VIEW  Comparison: 03/17/2011  Findings: Gaseous distention of the colon again noted, not significantly changed compatible with ileus.  No free air. Surgical clips in the abdomen.  No acute bony abnormality.  IMPRESSION: Stable adynamic ileus pattern.  Original Report Authenticated By: Cyndie Chime, [name]   Dg Abd 1 View  03/17/2011  *RADIOLOGY REPORT*  Clinical Data: Follow up colonic ileus  ABDOMEN - 1 VIEW  Comparison: 03/14/2011  Findings: Diffuse gaseous distention of the colon  is again identified and appears unchanged from previous exam.  Surgical clips noted within the right abdomen and right iliac fossa.  No abnormal abdominal or pelvic calcifications.  IMPRESSION:  1.  No change in the appearance of bowel gas pattern.  Original Report Authenticated By: Rosealee Albee, [name]      Assessment: 1. Postoperative ileus 2. Hypokalemia  Plan: 1. Advance diet 2. Continue to replace potassium    [place] Serpe C 03/19/2011, 8:28 AM

## 2011-03-19 NOTE — Progress Notes (Signed)
Subjective: 9 Days Post-Op Procedure(s) (LRB): EXTERNAL FIXATION LEG (Right)  R leg doing well Belly feeling better   Objective: Current Vitals Blood pressure 134/71, pulse 88, temperature 98 F (36.7 C), temperature source Oral, resp. rate 20, height 5\' 7"  (1.702 m), weight 96.7 kg (213 lb 3 oz), SpO2 99.00%. Vital signs in last 24 hours: Temp:  [98 F (36.7 C)-99.4 F (37.4 C)] 98 F (36.7 C) (12/06 1349) Pulse Rate:  [83-95] 88  (12/06 1349) Resp:  [20-22] 20  (12/06 1349) BP: (134-165)/(70-79) 134/71 mmHg (12/06 1349) SpO2:  [96 %-99 %] 99 % (12/06 1349) Weight:  [96.7 kg (213 lb 3 oz)] 213 lb 3 oz (96.7 kg) (12/06 0411)  Intake/Output from previous day: 12/05 0701 - 12/06 0700 In: 1250 [I.V.:1000; IV Piggyback:250] Out: 1050 [Urine:1050]  LABS  Basename 03/19/11 0630 03/18/11 0514 03/17/11 0510  HGB 9.3* 10.2* 10.2*    Basename 03/19/11 0630 03/18/11 0514  WBC 7.5 7.4  RBC 2.88* 3.19*  HCT 27.8* 30.5*  PLT 281 255    Basename 03/19/11 0630 03/18/11 0514  NA 137 135  K 2.7* 2.6*  CL 105 101  CO2 23 25  BUN 8 9  CREATININE 1.08 0.99  GLUCOSE 133* 139*  CALCIUM 8.2* 8.6   No results found for this basename: LABPT:2,INR:2 in the last 72 hours   Physical Exam  Gen:NAD Lungs: clear  Cardiac:reg Abd:NT + BS Ext: Ex fix stable, pinsites look good  No acute changes    Imaging Dg Abd 1 View  03/18/2011  *RADIOLOGY REPORT*  Clinical Data: Abdominal distention, postop ileus.  ABDOMEN - 1 VIEW  Comparison: 03/17/2011  Findings: Gaseous distention of the colon again noted, not significantly changed compatible with ileus.  No free air. Surgical clips in the abdomen.  No acute bony abnormality.  IMPRESSION: Stable adynamic ileus pattern.  Original Report Authenticated By: Cyndie Chime, [name]   Dg Abd Portable 1v  03/19/2011  *RADIOLOGY REPORT*  Clinical Data: Abdominal pain, follow up of ileus  ABDOMEN - 1 VIEW  Comparison: Abdomen films of 03/18/2011   Findings:  A portable supine view of the abdomen shows slight gaseous distention of the colon.  No bowel obstruction is seen, and this pattern is most consistent with ileus.  Surgical clips overlie the right abdomen.  There are degenerative changes in the lower lumbar spine.  IMPRESSION:  No change in probable mild ileus.  No definite obstruction.  Original Report Authenticated By: Juline Patch, [name]    Assessment/Plan: 9 Days Post-Op Procedure(s) (LRB): EXTERNAL FIXATION LEG (Right)  70 y/o s/p fall  1. R tibial plateau fx   NWB  OR next week (tues)  Cont pin care 2. Ileus  Improving 3. Hypomagnesemia  Resolve, started on po supplements 4. Hypokalemia  Per IM 5. Medical issues  Stable 6. TIA  ASA 7. DVT/PE prophylaxis  lovenox 8. Pain  Po meds 9. Diet  Heart healthy 10. Activity   Continue with PT/OT 11. Dispo  Appreciate IM and GI  Plan for OR next week  Mearl Latin, PA-C 03/19/2011, 4:14 PM

## 2011-03-19 NOTE — Progress Notes (Signed)
MD on call made aware of K level 2.7. Awaiting for call.

## 2011-03-19 NOTE — Progress Notes (Signed)
Utilization review completed. Concettina Leth, RN, BSN. 03/19/11  

## 2011-03-19 NOTE — Progress Notes (Signed)
Physical Therapy Treatment Patient Details Name: Peter Nicholson MRN: [id number] DOB: [birth year] Today's Date: 03/19/2011  PT Assessment/Plan  PT - Assessment/Plan Comments on Treatment Session: Still getting potassium but slow to rise and pt continues to be asymptomatic; did well with PT today better endurance but needing a rest break during ambulation. D/C plan still appropriate.  PT Plan: Discharge plan remains appropriate PT Frequency: Min 5X/week Follow Up Recommendations: Skilled nursing facility Equipment Recommended: Defer to next venue PT Goals  Acute Rehab PT Goals PT Goal: Supine/Side to Sit - Progress: Progressing toward goal PT Goal: Sit to Supine/Side - Progress: Progressing toward goal PT Transfer Goal: Bed to Chair/Chair to Bed - Progress: Progressing toward goal PT Goal: Ambulate - Progress: Progressing toward goal Pt will Go Up / Down Stairs:  (not addressed) Additional Goals PT Goal: Additional Goal #1 - Progress: Met  PT Treatment Precautions/Restrictions  Precautions Precautions: Fall Required Braces or Orthoses:  (external fixator in place) Restrictions Weight Bearing Restrictions: Yes RLE Weight Bearing: Non weight bearing Mobility (including Balance) Bed Mobility Bed Mobility: Yes Supine to Sit: 4: Min assist Supine to Sit Details (indicate cue type and reason): minA to assist with scooting LLE off bed; pt using bed rails Sitting - Scoot to Edge of Bed: 4: Min assist Sitting - Scoot to [place] of Bed Details (indicate cue type and reason): minA to assist with maintaining LLE up off floor in comfortable position Transfers Sit to Stand: 4: Min assist Sit to Stand Details (indicate cue type and reason): pt with good technique using hands on bed to push to stand but needing minA for follow through and to steady self once standing with RW Stand to Sit: 4: Min assist Stand to Sit Details: minA sequencing cues and to control  descent Ambulation/Gait Ambulation/Gait Assistance: 4: Min assist Ambulation/Gait Assistance Details (indicate cue type and reason): pt amb. approx 5 ft x2 with one seated rest break; cueing for safe technique and proper spacing with RW in prep for hop; pt amb with hop-to sequence; cueing for relaxed shoulder posture and safe timing/speed to reduce fatigue and maintain control Ambulation Distance (Feet): 10 Feet Assistive device: Rolling walker Gait Pattern: Step-to pattern    Exercise  Total Joint Exercises Ankle Circles/Pumps: Right;20 reps;Supine;AAROM;AROM End of Session PT - End of Session Equipment Utilized During Treatment: Gait belt Activity Tolerance: Patient tolerated treatment well Patient left: in chair Nurse Communication: Mobility status for transfers;Mobility status for ambulation General Behavior During Session: [facility] for tasks performed Cognition: [facility] for tasks performed  [facility] [name] 03/19/2011, 11:33 AM

## 2011-03-19 NOTE — Progress Notes (Signed)
This patient was discussed at long LOS rounds 12.05.12  

## 2011-03-20 LAB — GLUCOSE, CAPILLARY
Glucose-Capillary: 150 mg/dL — ABNORMAL HIGH (ref 70–99)
Glucose-Capillary: 164 mg/dL — ABNORMAL HIGH (ref 70–99)
Glucose-Capillary: 168 mg/dL — ABNORMAL HIGH (ref 70–99)

## 2011-03-20 LAB — BASIC METABOLIC PANEL
BUN: 8 mg/dL (ref 6–23)
CO2: 24 mEq/L (ref 19–32)
Chloride: 106 mEq/L (ref 96–112)
Creatinine, Ser: 1.03 mg/dL (ref 0.50–1.35)
Glucose, Bld: 172 mg/dL — ABNORMAL HIGH (ref 70–99)

## 2011-03-20 NOTE — Progress Notes (Signed)
Physical Therapy Treatment Patient Details Name: Peter Nicholson MRN: [id number] DOB: 01-May-1940 Today's Date: 03/20/2011  PT Assessment/Plan  PT - Assessment/Plan Comments on Treatment Session: Still struggling with activity tolerance but great motivation. Surgery likely Tuesday of next week.  PT Plan: Discharge plan remains appropriate PT Frequency: Min 5X/week Follow Up Recommendations: Skilled nursing facility Equipment Recommended: Defer to next venue PT Goals  Acute Rehab PT Goals PT Goal: Supine/Side to Sit - Progress: Progressing toward goal PT Goal: Sit to Supine/Side - Progress: Progressing toward goal PT Transfer Goal: Bed to Chair/Chair to Bed - Progress: Progressing toward goal PT Goal: Ambulate - Progress: Progressing toward goal  PT Treatment Precautions/Restrictions  Precautions Precautions: Fall Required Braces or Orthoses:  (external fixator in place) Restrictions Weight Bearing Restrictions: Yes RLE Weight Bearing: Non weight bearing Mobility (including Balance) Bed Mobility Supine to Sit: 4: Min assist Supine to Sit Details (indicate cue type and reason): minA only to hold leg at EOB; pt very capable of scooting but needing cues to breath during supine->sit as pt begins to turn red/holding breath Sitting - Scoot to Edge of Bed: 5: Supervision Transfers Sit to Stand: 1: +2 Total assist Sit to Stand Details (indicate cue type and reason): pt scooted too close to edge and having more difficulty getting left foot under her for sit->stand; min faciliation bilaterally to steady and to assisit with initiation of sit->stand Stand to Sit: 6: Modified independent (Device/Increase time) Ambulation/Gait Ambulation/Gait Assistance: 4: Min assist Ambulation/Gait Assistance Details (indicate cue type and reason): cues for safe technique and self monitoring; hop-to sequence with RW; good ability to maintain NWB RLE; easily fatigued; no rest breaks today though Ambulation  Distance (Feet): 10 Feet Assistive device: Rolling walker Gait Pattern: Step-to pattern    Exercise  General Exercises - Lower Extremity Ankle Circles/Pumps: AROM;Right;Left;20 reps;Supine End of Session PT - End of Session Equipment Utilized During Treatment: Gait belt Activity Tolerance: Patient tolerated treatment well;Patient limited by fatigue Patient left: in chair Nurse Communication: Mobility status for transfers;Mobility status for ambulation General Behavior During Session: Peter Nicholson for tasks performed Cognition: Peter Nicholson for tasks performed  Peter Nicholson Peter Nicholson 03/20/2011, 1:58 PM

## 2011-03-20 NOTE — Progress Notes (Signed)
Subjective: Abdomen feels softer and he is not having any abdominal pain. He is now passing some flatus.  Objective: Vital signs in last 24 hours: Temp:  [97.2 F (36.2 C)-99.4 F (37.4 C)] 97.6 F (36.4 C) (12/07 1350) Pulse Rate:  [80-102] 80  (12/07 1350) Resp:  [18-21] 20  (12/07 1350) BP: (111-166)/(66-90) 111/67 mmHg (12/07 1350) SpO2:  [96 %-98 %] 96 % (12/07 1350) Weight:  [97.6 kg (215 lb 2.7 oz)] 215 lb 2.7 oz (97.6 kg) (12/07 0357) Weight change: 0.9 kg (1 lb 15.7 oz) Last BM Date: 03/19/11  Intake/Output from previous day: 12/06 0701 - 12/07 0700 In: 2713.8 [P.O.:840; I.V.:1873.8] Out: 626 [Urine:625; Stool:1] Total I/O In: 930 [P.O.:480; I.V.:450] Out: 876 [Urine:875; Stool:1]   Physical Exam: General: Alert, awake, oriented x3, in no acute distress. HEENT: No bruits, no goiter. Heart: Regular rate and rhythm, without murmurs, rubs, gallops. Lungs: Clear to auscultation bilaterally. Abdomen: Distended although improved, positive bowel sounds, soft. Extremities: No clubbing cyanosis or edema with positive pedal pulses.    Lab Results: Basic Metabolic Panel:  Basename 03/20/11 0530 03/19/11 0630 03/18/11 0514  NA 137 137 --  K 3.7 2.7* --  CL 106 105 --  CO2 24 23 --  GLUCOSE 172* 133* --  BUN 8 8 --  CREATININE 1.03 1.08 --  CALCIUM 8.4 8.2* --  MG -- 1.7 1.7  PHOS -- -- --   CBC:  Basename 03/19/11 0630 03/18/11 0514  WBC 7.5 7.4  NEUTROABS -- --  HGB 9.3* 10.2*  HCT 27.8* 30.5*  MCV 96.5 95.6  PLT 281 255   CBG:  Basename 03/20/11 1205 03/20/11 0753 03/20/11 0352 03/20/11 0003 03/19/11 2029 03/19/11 1612  GLUCAP 166* 164* 150* 96 215* 156*    Studies/Results: Dg Abd Portable 1v  03/19/2011  *RADIOLOGY REPORT*  Clinical Data: Abdominal pain, follow up of ileus  ABDOMEN - 1 VIEW  Comparison: Abdomen films of 03/18/2011  Findings:  A portable supine view of the abdomen shows slight gaseous distention of the colon.  No bowel obstruction  is seen, and this pattern is most consistent with ileus.  Surgical clips overlie the right abdomen.  There are degenerative changes in the lower lumbar spine.  IMPRESSION:  No change in probable mild ileus.  No definite obstruction.  Original Report Authenticated By: Juline Patch, [name]    Medications: Scheduled Meds:   . allopurinol  300 mg Oral Daily  . aspirin  325 mg Oral Daily  . docusate sodium  100 mg Oral BID  . enoxaparin (LOVENOX) injection  40 mg Subcutaneous Daily  . insulin aspart  0-15 Units Subcutaneous Q4H  . lisinopril  10 mg Oral Daily  . magnesium oxide  400 mg Oral TID  . multivitamins ther. w/minerals  1 tablet Oral Daily  . pantoprazole  40 mg Oral Q1200  . polyethylene glycol  17 g Oral BID  . potassium chloride  20 mEq Oral Daily  . potassium chloride  40 mEq Oral Q4H  . senna-docusate  1 tablet Oral QHS   Continuous Infusions:   . dextrose 5 % and 0.45% NaCl 75 mL/hr at 03/20/11 1259   PRN Meds:.acetaminophen, ALPRAZolam, diphenhydrAMINE-zinc acetate, hydrALAZINE, HYDROcodone-acetaminophen, methocarbamol(ROBAXIN) IV, methocarbamol, metoCLOPramide, ondansetron (ZOFRAN) IV  Assessment/Plan:  Principal Problem:  *Closed fracture of tibial plateau Active Problems:  DIABETES MELLITUS  HYPERCHOLESTEROLEMIA  ANXIETY  HYPERTENSION  CEREBROVASCULAR DISEASE  VENOUS INSUFFICIENCY  GERD  Ileus  TIA (transient ischemic attack)  Hypokalemia  Hypomagnesemia   #1 tibial fracture: Management as per orthopedics, note plans for surgery next week.  #2 post operative ileus: Is clinically improving. Was likely related to hypokalemia and hypomagnesemia which are being repleted. Potassium today is 3.7. He has been placed on daily potassium supplementation as well as magnesium supplementation for one week.  #3 hypertension: Hydrochlorothiazide was discontinued secondary to hypokalemia. His blood pressure has been mainly well controlled although he does have some  occasional elevations into the systolic of 150-160 range. Blood pressure can be very labile in the hospital and related to things like pain and anxiety so I do not believe that we will do any further titration of his blood pressure medications at this time.  #4 TIA: Continue aspirin.  #5 diabetes mellitus: His CBGs have been in the 150-160 range. Okay to restart oral hypoglycemic agents at time of discharge, however while in the hospital I feel it is better for him to continue on sliding scale given the fact that he will again be n.p.o. soon for surgery.  #6 disposition: Patient seems to be clinically improving from the ileus standpoint. His electrolytes have been repleted. I have given recommendations for his hypertension and diabetes. At this moment internal medicine will sign off, but please call us back with any questions.   LOS: 11 days   HERNANDEZ ACOSTA,ESTELA Pager: [phone] 03/20/2011, 3:49 PM

## 2011-03-20 NOTE — Progress Notes (Cosign Needed)
Subjective: 10 Days Post-Op Procedure(s) (LRB): EXTERNAL FIXATION LEG (Right)  No acute events Stable abd improving R leg sore but ok  Objective: Current Vitals Blood pressure 166/76, pulse 101, temperature 97.2 F (36.2 C), temperature source Oral, resp. rate 18, height 5\' 7"  (1.702 m), weight 97.6 kg (215 lb 2.7 oz), SpO2 97.00%. Vital signs in last 24 hours: Temp:  [97.2 F (36.2 C)-99.4 F (37.4 C)] 97.2 F (36.2 C) (12/07 0955) Pulse Rate:  [88-102] 101  (12/07 0955) Resp:  [18-21] 18  (12/07 0955) BP: (121-166)/(66-90) 166/76 mmHg (12/07 0955) SpO2:  [97 %-99 %] 97 % (12/07 0955) Weight:  [97.6 kg (215 lb 2.7 oz)] 215 lb 2.7 oz (97.6 kg) (12/07 0357)  Intake/Output from previous day: 12/06 0701 - 12/07 0700 In: 2713.8 [P.O.:840; I.V.:1873.8] Out: 626 [Urine:625; Stool:1]  LABS  Basename 03/19/11 0630 03/18/11 0514  HGB 9.3* 10.2*    Basename 03/19/11 0630 03/18/11 0514  WBC 7.5 7.4  RBC 2.88* 3.19*  HCT 27.8* 30.5*  PLT 281 255    Basename 03/20/11 0530 03/19/11 0630  NA 137 137  K 3.7 2.7*  CL 106 105  CO2 24 23  BUN 8 8  CREATININE 1.03 1.08  GLUCOSE 172* 133*  CALCIUM 8.4 8.2*   No results found for this basename: LABPT:2,INR:2 in the last 72 hours    Physical Exam  Gen:NAD Ext: Ex-fix stable   No changes in exam  Motor and sensory functions intact  Extremity is warm  Swelling decreasing   Imaging Dg Abd Portable 1v  03/19/2011  *RADIOLOGY REPORT*  Clinical Data: Abdominal pain, follow up of ileus  ABDOMEN - 1 VIEW  Comparison: Abdomen films of 03/18/2011  Findings:  A portable supine view of the abdomen shows slight gaseous distention of the colon.  No bowel obstruction is seen, and this pattern is most consistent with ileus.  Surgical clips overlie the right abdomen.  There are degenerative changes in the lower lumbar spine.  IMPRESSION:  No change in probable mild ileus.  No definite obstruction.  Original Report Authenticated By: Juline Patch, [name]    Assessment/Plan: 10 Days Post-Op Procedure(s) (LRB): EXTERNAL FIXATION LEG (Right)  70 y/o s/p fall   1. R tibial plateau fx   NWB   OR next week (tues)   Cont pin care  2. Ileus   Improving  3. Hypomagnesemia    Resolve, started on po supplements  4. Hypokalemia   Per IM  5. Medical issues   Stable   BP little high yesterday evening. Defer to IM if necessary to add another agent or increase lisinopril  ? If time to restart PO DM agents as pt taking pos better and sugars have been slightly elevated 6. TIA   ASA  7. DVT/PE prophylaxis   lovenox  8. Pain   PO meds  9. Diet   Heart healthy  10. Activity   Continue with PT/OT  11. Dispo   Appreciate IM and GI   Plan for OR next week   Mearl Latin, PA-C 03/20/2011, 1:21 PM

## 2011-03-20 NOTE — Progress Notes (Addendum)
Patient remains on unit 4700. Spoke with [facility] [employer] [facility]. SNF bed is still available at facility. Will continue to monitor for medical stability per MD. Darylene Price, BSW, 03/20/2011 5:27 PM

## 2011-03-20 NOTE — Progress Notes (Signed)
[facility] Progress Note  Subjective: Abdomen loops distended and feels soft and subjectively is in no discomfort. He is passing some gas and a little bit of stool  Objective: Vital signs in last 24 hours: Temp:  [98 F (36.7 Nicholson)-99.4 F (37.4 Nicholson)] 98.7 F (37.1 Nicholson) (12/07 0357) Pulse Rate:  [88-102] 102  (12/07 0357) Resp:  [18-21] 18  (12/07 0357) BP: (121-166)/(66-90) 162/80 mmHg (12/07 0357) SpO2:  [97 %-99 %] 97 % (12/07 0357) Weight:  [97.6 kg (215 lb 2.7 oz)] 215 lb 2.7 oz (97.6 kg) (12/07 0357) Weight change: 0.9 kg (1 lb 15.7 oz)   PE: Abdomen soft, distended with positive bowel sounds  Lab Results: Results for orders placed during the hospital encounter of 03/09/11 (from the past 24 hour(s))  GLUCOSE, CAPILLARY     Status: Abnormal   Collection Time   03/19/11 11:56 AM      Component Value Range   Glucose-Capillary 167 (*) 70 - 99 (mg/dL)   Comment 1 Notify RN    GLUCOSE, CAPILLARY     Status: Abnormal   Collection Time   03/19/11  4:12 PM      Component Value Range   Glucose-Capillary 156 (*) 70 - 99 (mg/dL)   Comment 1 Notify RN    GLUCOSE, CAPILLARY     Status: Abnormal   Collection Time   03/19/11  8:29 PM      Component Value Range   Glucose-Capillary 215 (*) 70 - 99 (mg/dL)   Comment 1 Notify RN    GLUCOSE, CAPILLARY     Status: Normal   Collection Time   03/20/11 12:03 AM      Component Value Range   Glucose-Capillary 96  70 - 99 (mg/dL)   Comment 1 Notify RN     Comment 2 Documented in Chart    GLUCOSE, CAPILLARY     Status: Abnormal   Collection Time   03/20/11  3:52 AM      Component Value Range   Glucose-Capillary 150 (*) 70 - 99 (mg/dL)  BASIC METABOLIC PANEL     Status: Abnormal   Collection Time   03/20/11  5:30 AM      Component Value Range   Sodium 137  135 - 145 (mEq/L)   Potassium 3.7  3.5 - 5.1 (mEq/L)   Chloride 106  96 - 112 (mEq/L)   CO2 24  19 - 32 (mEq/L)   Glucose, Bld 172 (*) 70 - 99 (mg/dL)   BUN 8  6 - 23 (mg/dL)     Creatinine, Ser 4.78  0.50 - 1.35 (mg/dL)   Calcium 8.4  8.4 - 29.5 (mg/dL)   GFR calc non Af Amer 71 (*) >90 (mL/min)   GFR calc Af Amer 82 (*) >90 (mL/min)  GLUCOSE, CAPILLARY     Status: Abnormal   Collection Time   03/20/11  7:53 AM      Component Value Range   Glucose-Capillary 164 (*) 70 - 99 (mg/dL)   Comment 1 Notify RN      Studies/Results: Dg Abd 1 View  03/18/2011  *RADIOLOGY REPORT*  Clinical Data: Abdominal distention, postop ileus.  ABDOMEN - 1 VIEW  Comparison: 03/17/2011  Findings: Gaseous distention of the colon again noted, not significantly changed compatible with ileus.  No free air. Surgical clips in the abdomen.  No acute bony abnormality.  IMPRESSION: Stable adynamic ileus pattern.  Original Report Authenticated By: Cyndie Chime, [name]   Dg Abd Portable 1v  03/19/2011  *RADIOLOGY REPORT*  Clinical Data: Abdominal pain, follow up of ileus  ABDOMEN - 1 VIEW  Comparison: Abdomen films of 03/18/2011  Findings:  A portable supine view of the abdomen shows slight gaseous distention of the colon.  No bowel obstruction is seen, and this pattern is most consistent with ileus.  Surgical clips overlie the right abdomen.  There are degenerative changes in the lower lumbar spine.  IMPRESSION:  No change in probable mild ileus.  No definite obstruction.  Original Report Authenticated By: Juline Patch, [name]      Assessment: Postoperative ileus associated with hypokalemia, improved  Plan: Increase mobility as tolerated, supplement potassium as needed, advance diet as tolerated. We'll sign off for now.    Peter Nicholson 03/20/2011, 8:42 AM

## 2011-03-21 LAB — BASIC METABOLIC PANEL
CO2: 25 mEq/L (ref 19–32)
Calcium: 8.5 mg/dL (ref 8.4–10.5)
Glucose, Bld: 182 mg/dL — ABNORMAL HIGH (ref 70–99)
Sodium: 135 mEq/L (ref 135–145)

## 2011-03-21 LAB — GLUCOSE, CAPILLARY
Glucose-Capillary: 165 mg/dL — ABNORMAL HIGH (ref 70–99)
Glucose-Capillary: 202 mg/dL — ABNORMAL HIGH (ref 70–99)
Glucose-Capillary: 193 mg/dL — ABNORMAL HIGH (ref 70–99)

## 2011-03-21 LAB — CBC
Hemoglobin: 9.1 g/dL — ABNORMAL LOW (ref 13.0–17.0)
MCH: 31.7 pg (ref 26.0–34.0)
RBC: 2.87 MIL/uL — ABNORMAL LOW (ref 4.22–5.81)

## 2011-03-21 NOTE — Progress Notes (Signed)
Subjective: 11 Days Post-Op Procedure(s) (LRB): EXTERNAL FIXATION LEG (Right) Patient reports pain as 2 on 0-10 scale.    Objective: Vital signs in last 24 hours: Temp:  [97.2 F (36.2 C)-99.4 F (37.4 C)] 99.4 F (37.4 C) (12/08 0428) Pulse Rate:  [80-101] 95  (12/08 0428) Resp:  [18-20] 20  (12/08 0428) BP: (111-166)/(67-76) 125/75 mmHg (12/08 0428) SpO2:  [95 %-97 %] 95 % (12/08 0428) Weight:  [98.2 kg (216 lb 7.9 oz)] 216 lb 7.9 oz (98.2 kg) (12/08 0428)  Intake/Output from previous day: 12/07 0701 - 12/08 0700 In: 1170 [P.O.:720; I.V.:450] Out: 976 [Urine:975; Stool:1] Intake/Output this shift:     Basename 03/21/11 0600 03/19/11 0630  HGB 9.1* 9.3*    Basename 03/21/11 0600 03/19/11 0630  WBC 7.8 7.5  RBC 2.87* 2.88*  HCT 27.8* 27.8*  PLT 314 281    Basename 03/21/11 0600 03/20/11 0530  NA 135 137  K 3.6 3.7  CL 104 106  CO2 25 24  BUN 9 8  CREATININE 1.05 1.03  GLUCOSE 182* 172*  CALCIUM 8.5 8.4   No results found for this basename: LABPT:2,INR:2 in the last 72 hours  Neurologically intact Neurovascular intact  Assessment/Plan: 11 Days Post-Op Procedure(s) (LRB): EXTERNAL FIXATION LEG (Right) Advance diet Up with therapy Definitive surgery this Tuesday.  Dariann Huckaba,STEPHEN D 03/21/2011, 8:59 AM

## 2011-03-22 LAB — GLUCOSE, CAPILLARY
Glucose-Capillary: 155 mg/dL — ABNORMAL HIGH (ref 70–99)
Glucose-Capillary: 170 mg/dL — ABNORMAL HIGH (ref 70–99)
Glucose-Capillary: 201 mg/dL — ABNORMAL HIGH (ref 70–99)
Glucose-Capillary: 230 mg/dL — ABNORMAL HIGH (ref 70–99)

## 2011-03-22 LAB — BASIC METABOLIC PANEL
BUN: 9 mg/dL (ref 6–23)
CO2: 24 mEq/L (ref 19–32)
Calcium: 8.9 mg/dL (ref 8.4–10.5)
Creatinine, Ser: 0.98 mg/dL (ref 0.50–1.35)
Glucose, Bld: 187 mg/dL — ABNORMAL HIGH (ref 70–99)

## 2011-03-22 NOTE — Progress Notes (Signed)
PATIENT ID:      Peter Nicholson  MRN:     [id number] DOB/AGE:    71/04/1940 / 70 y.o.    PROGRESS NOTE Subjective:  negative for Chest Pain  negative for Shortness of Breath  negative for Nausea/Vomiting   negative for Calf Pain  negative for Bowel Movement   Tolerating Diet: yes         Patient reports pain as 4 on 0-10 scale.    Objective: Vital signs in last 24 hours:  Patient Vitals for the past 24 hrs:  BP Temp Temp src Pulse Resp SpO2 Weight  03/22/11 0535 166/79 mmHg 98.3 F (36.8 C) Oral 97  19  97 % 99.5 kg (219 lb 5.7 oz)  03/21/11 1357 155/75 mmHg 97.2 F (36.2 C) Oral 88  18  95 % -      Intake/Output from previous day:   12/08 0701 - 12/09 0700 In: 2721.3 [P.O.:940; I.V.:1781.3] Out: 426 [Urine:425]   Intake/Output this shift:       Intake/Output      12/08 0701 - 12/09 0700 12/09 0701 - 12/10 0700   P.O. 940    I.V. (mL/kg) 1781.3 (17.9)    Total Intake(mL/kg) 2721.3 (27.3)    Urine (mL/kg/hr) 425 (0.2)    Stool 1    Total Output 426    Net +2295.3            LABORATORY DATA:  Basename 03/21/11 0600 03/19/11 0630 03/18/11 0514 03/17/11 0510  WBC 7.8 7.5 7.4 7.4  HGB 9.1* 9.3* 10.2* 10.2*  HCT 27.8* 27.8* 30.5* 30.1*  PLT 314 281 255 235    Basename 03/22/11 0630 03/21/11 0600 03/20/11 0530 03/19/11 0630 03/18/11 0514 03/17/11 2048 03/17/11 1857  NA 138 135 137 137 135 134* 134*  K 3.4* 3.6 3.7 2.7* 2.6* 2.6* 2.6*  CL 105 104 106 105 101 101 100  CO2 24 25 24 23 25 25 26   BUN 9 9 8 8 9 11 11   CREATININE 0.98 1.05 1.03 1.08 0.99 0.99 1.03  GLUCOSE 187* 182* 172* 133* 139* 156* 165*  CALCIUM 8.9 8.5 8.4 8.2* 8.6 8.5 8.5   Lab Results  Component Value Date   INR 1.05 03/10/2011   INR 1.1 08/06/2008    Examination:  General appearance: alert, cooperative and no distress  Wound Exam: clean, dry, intact   Drainage:  None: wound tissue dry  Motor Exam EHL and FHL Intact  Sensory Exam Deep Peroneal normal  Assessment:    12 Days  Post-Op  Procedure(s) (LRB): EXTERNAL FIXATION LEG (Right)  ADDITIONAL DIAGNOSIS:  Principal Problem:  *Closed fracture of tibial plateau Active Problems:  DIABETES MELLITUS  HYPERCHOLESTEROLEMIA  ANXIETY  HYPERTENSION  CEREBROVASCULAR DISEASE  VENOUS INSUFFICIENCY  GERD  Ileus  TIA (transient ischemic attack)  Hypokalemia  Hypomagnesemia  Acute Blood Loss Anemia   Plan: Physical Therapy as ordered Non Weight Bearing (NWB)  DVT Prophylaxis:  None  DISCHARGE PLAN: definitive surgery tuesday, up with pt,          Niya Behler 03/22/2011, 9:32 AM

## 2011-03-23 LAB — BASIC METABOLIC PANEL
Calcium: 9 mg/dL (ref 8.4–10.5)
GFR calc Af Amer: >90 mL/min (ref >90)
GFR calc non Af Amer: 81 mL/min — ABNORMAL LOW (ref >90)
Potassium: 3.9 mEq/L (ref 3.5–5.1)
Sodium: 135 mEq/L (ref 135–145)

## 2011-03-23 LAB — CBC
MCH: 32.1 pg (ref 26.0–34.0)
MCHC: 32.5 g/dL (ref 30.0–36.0)
MCV: 98.7 fL (ref 78.0–100.0)
Platelets: 434 10*3/uL — ABNORMAL HIGH (ref 150–400)

## 2011-03-23 LAB — GLUCOSE, CAPILLARY
Glucose-Capillary: 147 mg/dL — ABNORMAL HIGH (ref 70–99)
Glucose-Capillary: 144 mg/dL — ABNORMAL HIGH (ref 70–99)
Glucose-Capillary: 169 mg/dL — ABNORMAL HIGH (ref 70–99)

## 2011-03-23 MED ORDER — CEFAZOLIN SODIUM-DEXTROSE 2-3 GM-% IV SOLR
2.0000 g | Freq: Once | INTRAVENOUS | Status: AC
  Administered 2011-03-23: 2 g via INTRAVENOUS
  Filled 2011-03-23 (×2): qty 50

## 2011-03-23 NOTE — Progress Notes (Signed)
Physical Therapy Treatment Patient Details Name: STEVENSON WINDMILLER MRN: [id number] DOB: [birth year] Today's Date: 03/23/2011  PT Assessment/Plan  PT - Assessment/Plan Comments on Treatment Session: Surgery tomorrow. Will f/u but will need new orders.  PT Plan: Discharge plan remains appropriate PT Frequency: Min 5X/week Follow Up Recommendations: Skilled nursing facility Equipment Recommended: Defer to next venue PT Goals  Acute Rehab PT Goals Time For Goal Achievement: 2 weeks PT Transfer Goal: Bed to Chair/Chair to Bed - Progress: Progressing toward goal PT Goal: Ambulate - Progress: Progressing toward goal  PT Treatment Precautions/Restrictions  Precautions Precautions: Fall Required Braces or Orthoses:  (external fixator in place) Restrictions Weight Bearing Restrictions: Yes RLE Weight Bearing: Non weight bearing Mobility (including Balance) Bed Mobility Bed Mobility: No Transfers Sit to Stand Details (indicate cue type and reason): minA only to assist with lowering RLE; initial unsteadiness upon standing but pt able to self stabilize with RW Stand to Sit: 4: Min assist Stand to Sit Details: minA to lift RLE Ambulation/Gait Ambulation/Gait Assistance: 5: Supervision Ambulation/Gait Assistance Details (indicate cue type and reason): verbal cues for self monitoring and relaxed shoulder posture; hop to sequence Ambulation Distance (Feet): 18 Feet Assistive device: Rolling walker Gait Pattern: Step-to pattern    Exercise  Total Joint Exercises Ankle Circles/Pumps: AROM;Seated;Right;10 reps End of Session PT - End of Session Equipment Utilized During Treatment: Gait belt Activity Tolerance: Patient tolerated treatment well;Patient limited by fatigue Patient left: in chair Nurse Communication: Mobility status for transfers;Mobility status for ambulation General Behavior During Session: [facility] for tasks performed Cognition: [facility] for tasks performed  [facility]  [name] 03/23/2011, 1:13 PM

## 2011-03-23 NOTE — Progress Notes (Signed)
Utilization review completed. Cleston Lautner, RN, BSN. 03/23/11  

## 2011-03-23 NOTE — Progress Notes (Signed)
Subjective: 13 Days Post-Op Procedure(s) (LRB): EXTERNAL FIXATION LEG (Right)  No complaints  Doing well Good BM's Tolerating diet No chest pain, no sob   Objective: Current Vitals Blood pressure 131/76, pulse 90, temperature 98.3 F (36.8 C), temperature source Oral, resp. rate 19, height 5\' 7"  (1.702 m), weight 115.6 kg (254 lb 13.6 oz), SpO2 96.00%. Vital signs in last 24 hours: Temp:  [97.2 F (36.2 C)-98.5 F (36.9 C)] 98.3 F (36.8 C) (12/10 0301) Pulse Rate:  [83-90] 90  (12/10 0301) Resp:  [18-19] 19  (12/10 0301) BP: (114-137)/(72-76) 131/76 mmHg (12/10 1014) SpO2:  [95 %-98 %] 96 % (12/10 0301) Weight:  [115.6 kg (254 lb 13.6 oz)] 254 lb 13.6 oz (115.6 kg) (12/10 0301)  Intake/Output from previous day: 12/09 0701 - 12/10 0700 In: 1126.3 [P.O.:440; I.V.:686.3] Out: 552 [Urine:550; Stool:2]  LABS  Basename 03/23/11 0911 03/21/11 0600  HGB 10.1* 9.1*    Basename 03/23/11 0911 03/21/11 0600  WBC 10.6* 7.8  RBC 3.15* 2.87*  HCT 31.1* 27.8*  PLT 434* 314    Basename 03/23/11 0617 03/22/11 0630  NA 135 138  K 3.9 3.4*  CL 105 105  CO2 19 24  BUN 12 9  CREATININE 0.96 0.98  GLUCOSE 177* 187*  CALCIUM 9.0 8.9   No results found for this basename: LABPT:2,INR:2 in the last 72 hours   Physical Exam  Gen: sitting in chair, NAD Lungs: clear Cardiac: s1 and s2  Abd: + BS Ext: ex fix stable  Motor and sensory functions intact R leg  Ext warm   + DP pulse   Imaging No results found.  Assessment/Plan: 13 Days Post-Op Procedure(s) (LRB): EXTERNAL FIXATION LEG (Right)  70 y/o s/p fall   1. R tibial plateau fx   NWB   OR tom   Cont pin care  2. Ileus   Improving, resolved  3. Hypomagnesemia   resolved  4. Hypokalemia   resolved 5. Medical issues   Stable  6. TIA   ASA  7. DVT/PE prophylaxis   Hold lovenox  8. Pain   PO meds  9. Diet   Heart healthy   NPO after MN 10. Activity   Continue with PT/OT  11. Dispo   OR  tomorrow  Once pain controlled post op and mobilizing well will d/c to snf  SW eval for snf search   Mearl Latin, PA-C 03/23/2011, 11:14 AM

## 2011-03-24 ENCOUNTER — Inpatient Hospital Stay (HOSPITAL_COMMUNITY): Payer: Medicare Other

## 2011-03-24 ENCOUNTER — Encounter (HOSPITAL_COMMUNITY)
Admission: EM | Disposition: A | Discharge status: Skilled Nursing Facility | Payer: Self-pay | Source: Home / Self Care | Attending: Orthopedic Surgery

## 2011-03-24 ENCOUNTER — Inpatient Hospital Stay (HOSPITAL_COMMUNITY): Payer: Medicare Other | Admitting: Anesthesiology

## 2011-03-24 ENCOUNTER — Encounter (HOSPITAL_COMMUNITY): Payer: Self-pay | Admitting: Anesthesiology

## 2011-03-24 HISTORY — PX: ORIF TIBIA PLATEAU: SHX2132

## 2011-03-24 LAB — CBC
Hemoglobin: 9.1 g/dL — ABNORMAL LOW (ref 13.0–17.0)
MCH: 32.9 pg (ref 26.0–34.0)
MCHC: 33.5 g/dL (ref 30.0–36.0)
Platelets: 433 10*3/uL — ABNORMAL HIGH (ref 150–400)
RDW: 13.4 % (ref 11.5–15.5)

## 2011-03-24 LAB — BASIC METABOLIC PANEL
Calcium: 8.6 mg/dL (ref 8.4–10.5)
GFR calc Af Amer: 82 mL/min — ABNORMAL LOW (ref >90)
GFR calc non Af Amer: 71 mL/min — ABNORMAL LOW (ref >90)
Glucose, Bld: 205 mg/dL — ABNORMAL HIGH (ref 70–99)
Sodium: 135 mEq/L (ref 135–145)

## 2011-03-24 LAB — GLUCOSE, CAPILLARY
Glucose-Capillary: 176 mg/dL — ABNORMAL HIGH (ref 70–99)
Glucose-Capillary: 152 mg/dL — ABNORMAL HIGH (ref 70–99)
Glucose-Capillary: 174 mg/dL — ABNORMAL HIGH (ref 70–99)

## 2011-03-24 LAB — TYPE AND SCREEN
ABO/RH(D): A POS
Antibody Screen: NEGATIVE

## 2011-03-24 SURGERY — OPEN REDUCTION INTERNAL FIXATION (ORIF) TIBIAL PLATEAU
Anesthesia: General | Site: Knee | Laterality: Right | Wound class: Clean

## 2011-03-24 MED ORDER — DIPHENHYDRAMINE HCL 12.5 MG/5ML PO ELIX
12.5000 mg | ORAL_SOLUTION | Freq: Four times a day (QID) | ORAL | Status: DC | PRN
  Filled 2011-03-24: qty 5

## 2011-03-24 MED ORDER — ONDANSETRON HCL 4 MG/2ML IJ SOLN: 4.0000 mg | Freq: Four times a day (QID) | INTRAMUSCULAR | Status: DC | PRN

## 2011-03-24 MED ORDER — ONDANSETRON HCL 4 MG/2ML IJ SOLN
INTRAMUSCULAR | Status: DC | PRN
  Administered 2011-03-24: 4 mg via INTRAVENOUS

## 2011-03-24 MED ORDER — ACETAMINOPHEN 10 MG/ML IV SOLN
INTRAVENOUS | Status: AC
  Filled 2011-03-24: qty 100

## 2011-03-24 MED ORDER — CEFAZOLIN SODIUM 1-5 GM-% IV SOLN
INTRAVENOUS | Status: AC
  Filled 2011-03-24: qty 100

## 2011-03-24 MED ORDER — OXYCODONE HCL 5 MG PO TABS
5.0000 mg | ORAL_TABLET | ORAL | Status: DC | PRN
Start: 2011-03-24 — End: 2011-03-27
  Administered 2011-03-27: 5 mg via ORAL
  Filled 2011-03-24: qty 1

## 2011-03-24 MED ORDER — LACTATED RINGERS IV SOLN: INTRAVENOUS | Status: DC

## 2011-03-24 MED ORDER — FENTANYL CITRATE 0.05 MG/ML IJ SOLN: 25.0000 ug | INTRAMUSCULAR | Status: DC | PRN

## 2011-03-24 MED ORDER — SODIUM CHLORIDE 0.9 % IJ SOLN: 9.0000 mL | INTRAMUSCULAR | Status: DC | PRN

## 2011-03-24 MED ORDER — FENTANYL CITRATE 0.05 MG/ML IJ SOLN
INTRAMUSCULAR | Status: DC | PRN
  Administered 2011-03-24: 100 ug via INTRAVENOUS
  Administered 2011-03-24 (×2): 50 ug via INTRAVENOUS
  Administered 2011-03-24 (×2): 100 ug via INTRAVENOUS
  Administered 2011-03-24 (×2): 50 ug via INTRAVENOUS
  Administered 2011-03-24: 250 ug via INTRAVENOUS
  Administered 2011-03-24 (×3): 50 ug via INTRAVENOUS

## 2011-03-24 MED ORDER — MORPHINE SULFATE 2 MG/ML IJ SOLN: 1.0000 mg | INTRAMUSCULAR | Status: DC | PRN

## 2011-03-24 MED ORDER — CEFAZOLIN SODIUM 1-5 GM-% IV SOLN
1.0000 g | Freq: Four times a day (QID) | INTRAVENOUS | Status: AC
  Administered 2011-03-24 – 2011-03-25 (×3): 1 g via INTRAVENOUS
  Filled 2011-03-24 (×3): qty 50

## 2011-03-24 MED ORDER — GLYCOPYRROLATE 0.2 MG/ML IJ SOLN
INTRAMUSCULAR | Status: DC | PRN
  Administered 2011-03-24: .6 mg via INTRAVENOUS

## 2011-03-24 MED ORDER — SODIUM CHLORIDE 0.9 % IR SOLN
Status: DC | PRN
  Administered 2011-03-24: 1000 mL

## 2011-03-24 MED ORDER — ROCURONIUM BROMIDE 100 MG/10ML IV SOLN
INTRAVENOUS | Status: DC | PRN
  Administered 2011-03-24: 50 mg via INTRAVENOUS

## 2011-03-24 MED ORDER — POTASSIUM CHLORIDE IN NACL 20-0.9 MEQ/L-% IV SOLN
INTRAVENOUS | Status: DC
  Administered 2011-03-24 – 2011-03-25 (×2): via INTRAVENOUS
  Filled 2011-03-24 (×4): qty 1000

## 2011-03-24 MED ORDER — ONDANSETRON HCL 4 MG PO TABS: 4.0000 mg | ORAL_TABLET | Freq: Four times a day (QID) | ORAL | Status: DC | PRN

## 2011-03-24 MED ORDER — CEFAZOLIN SODIUM 1-5 GM-% IV SOLN
INTRAVENOUS | Status: DC | PRN
  Administered 2011-03-24: 2 g via INTRAVENOUS

## 2011-03-24 MED ORDER — DIPHENHYDRAMINE HCL 50 MG/ML IJ SOLN: 12.5000 mg | Freq: Four times a day (QID) | INTRAMUSCULAR | Status: DC | PRN

## 2011-03-24 MED ORDER — NEOSTIGMINE METHYLSULFATE 1 MG/ML IJ SOLN
INTRAMUSCULAR | Status: DC | PRN
  Administered 2011-03-24: 5 mg via INTRAVENOUS

## 2011-03-24 MED ORDER — NALOXONE HCL 0.4 MG/ML IJ SOLN: 0.4000 mg | INTRAMUSCULAR | Status: DC | PRN

## 2011-03-24 MED ORDER — METOPROLOL TARTRATE 1 MG/ML IV SOLN
INTRAVENOUS | Status: DC | PRN
  Administered 2011-03-24: 2 mg via INTRAVENOUS

## 2011-03-24 MED ORDER — ACETAMINOPHEN 10 MG/ML IV SOLN
INTRAVENOUS | Status: DC | PRN
  Administered 2011-03-24: 1000 mg via INTRAVENOUS

## 2011-03-24 MED ORDER — VECURONIUM BROMIDE 10 MG IV SOLR
INTRAVENOUS | Status: DC | PRN
  Administered 2011-03-24: 1 mg via INTRAVENOUS
  Administered 2011-03-24: 3 mg via INTRAVENOUS

## 2011-03-24 MED ORDER — LACTATED RINGERS IV SOLN
INTRAVENOUS | Status: DC | PRN
  Administered 2011-03-24 (×2): via INTRAVENOUS

## 2011-03-24 MED ORDER — METOCLOPRAMIDE HCL 5 MG PO TABS
5.0000 mg | ORAL_TABLET | Freq: Three times a day (TID) | ORAL | Status: DC | PRN
  Filled 2011-03-24: qty 2

## 2011-03-24 MED ORDER — ENOXAPARIN SODIUM 40 MG/0.4ML ~~LOC~~ SOLN
40.0000 mg | SUBCUTANEOUS | Status: DC
  Administered 2011-03-24 – 2011-03-26 (×3): 40 mg via SUBCUTANEOUS
  Filled 2011-03-24 (×4): qty 0.4

## 2011-03-24 MED ORDER — MIDAZOLAM HCL 5 MG/5ML IJ SOLN
INTRAMUSCULAR | Status: DC | PRN
  Administered 2011-03-24: 2 mg via INTRAVENOUS

## 2011-03-24 MED ORDER — METOCLOPRAMIDE HCL 5 MG/ML IJ SOLN
5.0000 mg | Freq: Three times a day (TID) | INTRAMUSCULAR | Status: DC | PRN
  Filled 2011-03-24: qty 2

## 2011-03-24 MED ORDER — MORPHINE SULFATE (PF) 1 MG/ML IV SOLN
INTRAVENOUS | Status: DC
  Administered 2011-03-24: 13 mg via INTRAVENOUS
  Administered 2011-03-24: 12:00:00 via INTRAVENOUS
  Administered 2011-03-25: 5 mg via INTRAVENOUS
  Administered 2011-03-25: 26 mg via INTRAVENOUS
  Administered 2011-03-25 (×2): 3 mg via INTRAVENOUS
  Administered 2011-03-25: 5 mg via INTRAVENOUS
  Administered 2011-03-25: 13 mg via INTRAVENOUS
  Administered 2011-03-26: 4 mL via INTRAVENOUS
  Administered 2011-03-26: 4 mg via INTRAVENOUS
  Administered 2011-03-26: 01:00:00 via INTRAVENOUS
  Administered 2011-03-26: 6 mg via INTRAVENOUS
  Filled 2011-03-24 (×3): qty 25

## 2011-03-24 MED ORDER — PHENYLEPHRINE HCL 10 MG/ML IJ SOLN
INTRAMUSCULAR | Status: DC | PRN
  Administered 2011-03-24 (×3): 80 ug via INTRAVENOUS

## 2011-03-24 MED ORDER — PROPOFOL 10 MG/ML IV EMUL
INTRAVENOUS | Status: DC | PRN
  Administered 2011-03-24: 110 mg via INTRAVENOUS

## 2011-03-24 SURGICAL SUPPLY — 76 items
BANDAGE ACE 4 STERILE (GAUZE/BANDAGES/DRESSINGS) ×2 IMPLANT
BANDAGE ELASTIC 4 VELCRO ST LF (GAUZE/BANDAGES/DRESSINGS) ×2 IMPLANT
BANDAGE ELASTIC 6 VELCRO ST LF (GAUZE/BANDAGES/DRESSINGS) ×2 IMPLANT
BANDAGE GAUZE ELAST BULKY 4 IN (GAUZE/BANDAGES/DRESSINGS) ×2 IMPLANT
BIT DRILL CALI LONG 2.8MM (BIT) ×1 IMPLANT
BIT DRILL PERC QC 2.8X200 100 (BIT) ×1 IMPLANT
BLADE SURG 10 STRL SS (BLADE) ×2 IMPLANT
BLADE SURG 15 STRL LF DISP TIS (BLADE) ×1 IMPLANT
BLADE SURG 15 STRL SS (BLADE) ×1
BLADE SURG ROTATE 9660 (MISCELLANEOUS) ×2 IMPLANT
BRUSH SCRUB DISP (MISCELLANEOUS) ×4 IMPLANT
CLEANER TIP ELECTROSURG 2X2 (MISCELLANEOUS) ×2 IMPLANT
CLOTH BEACON ORANGE TIMEOUT ST (SAFETY) ×2 IMPLANT
COVER MAYO STAND STRL (DRAPES) ×2 IMPLANT
DRAPE C-ARM 42X72 X-RAY (DRAPES) ×2 IMPLANT
DRAPE C-ARMOR (DRAPES) ×2 IMPLANT
DRAPE INCISE IOBAN 66X45 STRL (DRAPES) ×2 IMPLANT
DRAPE ORTHO SPLIT 77X108 STRL (DRAPES) ×1
DRAPE SURG ORHT 6 SPLT 77X108 (DRAPES) ×1 IMPLANT
DRAPE U-SHAPE 47X51 STRL (DRAPES) ×2 IMPLANT
DRILL BIT CALI LONG 2.8MM (BIT) ×2
DRILL BIT QUICK COUP 2.8MM 100 (BIT) ×1
DRSG ADAPTIC 3X8 NADH LF (GAUZE/BANDAGES/DRESSINGS) ×2 IMPLANT
DRSG PAD ABDOMINAL 8X10 ST (GAUZE/BANDAGES/DRESSINGS) ×2 IMPLANT
ELECT REM PT RETURN 9FT ADLT (ELECTROSURGICAL) ×2
ELECTRODE REM PT RTRN 9FT ADLT (ELECTROSURGICAL) ×1 IMPLANT
EVACUATOR 1/8 PVC DRAIN (DRAIN) IMPLANT
EVACUATOR 3/16  PVC DRAIN (DRAIN)
EVACUATOR 3/16 PVC DRAIN (DRAIN) IMPLANT
GLOVE BIO SURGEON STRL SZ7.5 (GLOVE) ×4 IMPLANT
GLOVE BIO SURGEON STRL SZ8 (GLOVE) ×4 IMPLANT
GLOVE BIOGEL PI IND STRL 7.5 (GLOVE) ×2 IMPLANT
GLOVE BIOGEL PI IND STRL 8 (GLOVE) ×2 IMPLANT
GLOVE BIOGEL PI INDICATOR 7.5 (GLOVE) ×2
GLOVE BIOGEL PI INDICATOR 8 (GLOVE) ×2
GOWN PREVENTION PLUS XLARGE (GOWN DISPOSABLE) ×4 IMPLANT
GOWN STRL NON-REIN LRG LVL3 (GOWN DISPOSABLE) ×4 IMPLANT
IMMOBILIZER KNEE 22 UNIV (SOFTGOODS) ×2 IMPLANT
KIT BASIN OR (CUSTOM PROCEDURE TRAY) ×2 IMPLANT
KIT ROOM TURNOVER OR (KITS) ×2 IMPLANT
MANIFOLD NEPTUNE II (INSTRUMENTS) ×2 IMPLANT
NEEDLE 22X1 1/2 (OR ONLY) (NEEDLE) IMPLANT
NS IRRIG 1000ML POUR BTL (IV SOLUTION) ×2 IMPLANT
PACK ORTHO EXTREMITY (CUSTOM PROCEDURE TRAY) ×2 IMPLANT
PAD ARMBOARD 7.5X6 YLW CONV (MISCELLANEOUS) ×4 IMPLANT
PAD CAST 4YDX4 CTTN HI CHSV (CAST SUPPLIES) ×1 IMPLANT
PADDING CAST COTTON 4X4 STRL (CAST SUPPLIES) ×1
PADDING CAST COTTON 6X4 STRL (CAST SUPPLIES) ×2 IMPLANT
SCREW LOCKING 3.5X70MM VA (Screw) ×2 IMPLANT
SCREW LOCKING 3.5X80MM VA (Screw) ×2 IMPLANT
SCREW LOCKING VA 3.5X36MM (Screw) ×2 IMPLANT
SCREW LOCKING VA 3.5X75MM (Screw) ×4 IMPLANT
SCREW LOCKING VA 3.5X85MM (Screw) ×4 IMPLANT
SPONGE GAUZE 4X4 12PLY (GAUZE/BANDAGES/DRESSINGS) ×2 IMPLANT
SPONGE LAP 18X18 X RAY DECT (DISPOSABLE) ×2 IMPLANT
STAPLER VISISTAT 35W (STAPLE) ×2 IMPLANT
STOCKINETTE IMPERVIOUS LG (DRAPES) ×2 IMPLANT
SUCTION FRAZIER TIP 10 FR DISP (SUCTIONS) ×2 IMPLANT
SUT ETHILON 3 0 PS 1 (SUTURE) IMPLANT
SUT PROLENE 0 CT 2 (SUTURE) IMPLANT
SUT VIC AB 0 CT1 27 (SUTURE) ×1
SUT VIC AB 0 CT1 27XBRD ANBCTR (SUTURE) ×1 IMPLANT
SUT VIC AB 1 CT1 27 (SUTURE) ×2
SUT VIC AB 1 CT1 27XBRD ANBCTR (SUTURE) ×1 IMPLANT
SUT VIC AB 2-0 CT1 27 (SUTURE) ×2
SUT VIC AB 2-0 CT1 TAPERPNT 27 (SUTURE) ×1 IMPLANT
SYNTHES 2.8MM LONG DRILL BIT ×2 IMPLANT
SYR 20ML ECCENTRIC (SYRINGE) IMPLANT
Synthes 3.5mm VA-LCP proximal tibia plate ×2 IMPLANT
Synthes 3.5mm VA-locking Screw 40mm ×2 IMPLANT
TOWEL OR 17X24 6PK STRL BLUE (TOWEL DISPOSABLE) ×2 IMPLANT
TOWEL OR 17X26 10 PK STRL BLUE (TOWEL DISPOSABLE) ×4 IMPLANT
TRAY FOLEY CATH 14FR (SET/KITS/TRAYS/PACK) ×2 IMPLANT
TUBE CONNECTING 12X1/4 (SUCTIONS) ×2 IMPLANT
WATER STERILE IRR 1000ML POUR (IV SOLUTION) ×4 IMPLANT
YANKAUER SUCT BULB TIP NO VENT (SUCTIONS) ×2 IMPLANT

## 2011-03-24 NOTE — Anesthesia Postprocedure Evaluation (Addendum)
  Anesthesia Post-op Note  Patient: Peter Nicholson  Procedure(s) Performed:  OPEN REDUCTION INTERNAL FIXATION (ORIF) TIBIAL PLATEAU  Patient Location: PACU  Anesthesia Type: GA combined with regional for post-op pain   Error: no regional block, only GA  Level of Consciousness: awake, alert  and oriented  Airway and Oxygen Therapy: Patient Spontanous Breathing  Post-op Pain: mild  Post-op Assessment: Post-op Vital signs reviewed, Patient's Cardiovascular Status Stable, Respiratory Function Stable, Patent Airway, No signs of Nausea or vomiting and Pain level controlled  Post-op Vital Signs: Reviewed and stable  Complications: No apparent anesthesia complications

## 2011-03-24 NOTE — Anesthesia Preprocedure Evaluation (Addendum)
Anesthesia Evaluation  Patient identified by MRN, date of birth, ID band Patient awake    Reviewed: Allergy & Precautions, H&P , NPO status , Patient's Chart, lab work & pertinent test results  Airway Mallampati: II TM Distance: >3 FB Neck ROM: Full    Dental  (+) Chipped and Dental Advisory Given,    Pulmonary sleep apnea (does not wear mask, could not tolerate) ,  clear to auscultation  Pulmonary exam normal       Cardiovascular Exercise Tolerance: Good hypertension, Pt. on medications Regular Normal    Neuro/Psych TIA Neuromuscular disease CVA, No Residual Symptoms    GI/Hepatic Neg liver ROS, PUD, GERD-  Medicated and Controlled,History of diaphragmatic hernia. Recent ileus during this admission.   Endo/Other  Type 2, Oral Hypoglycemic AgentsMorbid obesity  Renal/GU History of renal CA; one kidney removed in 1994. Diaphragmatic hernia repaired at that time. No residual issues.   Genitourinary negative   Musculoskeletal  (+) Arthritis -,   Abdominal (+) obese,   Peds negative pediatric ROS (+)  Hematology  (+) Blood dyscrasia (last dose of Plavix 2 weeks ago), ,   Anesthesia Other Findings   Reproductive/Obstetrics negative OB ROS                       Anesthesia Physical Anesthesia Plan  ASA: III  Anesthesia Plan: General   Post-op Pain Management:    Induction: Intravenous  Airway Management Planned: Oral ETT  Additional Equipment:   Intra-op Plan:   Post-operative Plan: Extubation in OR  Informed Consent: I have reviewed the patients History and Physical, chart, labs and discussed the procedure including the risks, benefits and alternatives for the proposed anesthesia with the patient or authorized representative who has indicated his/her understanding and acceptance.   Dental advisory given  Plan Discussed with: CRNA and Surgeon  Anesthesia Plan Comments: (Plan GETA with  routine monitoring)        Anesthesia Quick Evaluation

## 2011-03-24 NOTE — Transfer of Care (Signed)
Immediate Anesthesia Transfer of Care Note  Patient: Peter Nicholson  Procedure(s) Performed:  OPEN REDUCTION INTERNAL FIXATION (ORIF) TIBIAL PLATEAU  Patient Location: PACU  Anesthesia Type: General  Level of Consciousness: awake and patient cooperative  Airway & Oxygen Therapy: Patient Spontanous Breathing and Patient connected to face mask oxygen  Post-op Assessment: Report given to PACU RN, Post -op Vital signs reviewed and stable and Patient moving all extremities X 4  Post vital signs: Reviewed and stable  Complications: No apparent anesthesia complications

## 2011-03-24 NOTE — Progress Notes (Signed)
No change in d/c plan. Will seek SNF placement- hopefully at [facility] when medically stable. The bed is no longer available at this time- will need to request another bed closer to stability. Patient is currently in surgery today. Will monitor.  Darylene Price, BSW, 03/24/2011 11:18 AM

## 2011-03-24 NOTE — Brief Op Note (Signed)
03/09/2011 - 03/24/2011  10:42 AM  PATIENT:  Mariea Clonts  70 y.o. male  PRE-OPERATIVE DIAGNOSIS:  right tibial plateau fracture   POST-OPERATIVE DIAGNOSIS:  right tibial plateau fracture   PROCEDURE:  Procedure(s): OPEN REDUCTION INTERNAL FIXATION (ORIF) bicondylar TIBIAL PLATEAU 2. Removal of external fixator 3. Anterior compartment fasciotomy  SURGEON:  Surgeon(s): Doralee Albino Lionell Matuszak  PHYSICIAN ASSISTANT: Montez Morita, [facility]  ANESTHESIA:   general  EBL:  Total I/O In: 1000 [I.V.:1000] Out: 550 [Urine:350; Blood:200]  BLOOD ADMINISTERED:none  DRAINS: none   LOCAL MEDICATIONS USED:  NONE  SPECIMEN:  No Specimen  DISPOSITION OF SPECIMEN:  N/A  COUNTS: YES  TOURNIQUET:  * No tourniquets in log *  DICTATION: .Other Dictation: Dictation Number [id number]  PLAN OF CARE: Admit to inpatient   PATIENT DISPOSITION:  PACU - hemodynamically stable.   Delay start of Pharmacological VTE agent (>24hrs) due to surgical blood loss or risk of bleeding:  No

## 2011-03-24 NOTE — Anesthesia Procedure Notes (Signed)
Procedure Name: Intubation Date/Time: 03/24/2011 8:28 AM Performed by: Adria Dill Oxygen Delivery Method: Circle System Utilized Preoxygenation: Pre-oxygenation with 100% oxygen Intubation Type: IV induction and Circoid Pressure applied Ventilation: Mask ventilation without difficulty Laryngoscope Size: Miller and 2 Grade View: Grade I Tube type: Oral Tube size: 7.5 mm Number of attempts: 1 Placement Confirmation: ETT inserted through vocal cords under direct vision,  breath sounds checked- equal and bilateral and positive ETCO2 Secured at: 22 cm Tube secured with: Tape Dental Injury: Teeth and Oropharynx as per pre-operative assessment

## 2011-03-24 NOTE — Preoperative (Signed)
Beta Blockers   Reason not to administer Beta Blockers:Not Applicable 

## 2011-03-25 ENCOUNTER — Inpatient Hospital Stay (HOSPITAL_COMMUNITY): Payer: Medicare Other

## 2011-03-25 LAB — CBC
HCT: 27.3 % — ABNORMAL LOW (ref 39.0–52.0)
MCV: 100 fL (ref 78.0–100.0)
RDW: 13.6 % (ref 11.5–15.5)
WBC: 11.3 10*3/uL — ABNORMAL HIGH (ref 4.0–10.5)

## 2011-03-25 LAB — BASIC METABOLIC PANEL
BUN: 10 mg/dL (ref 6–23)
Chloride: 105 mEq/L (ref 96–112)
Creatinine, Ser: 1.03 mg/dL (ref 0.50–1.35)
GFR calc Af Amer: 82 mL/min — ABNORMAL LOW (ref >90)
Glucose, Bld: 213 mg/dL — ABNORMAL HIGH (ref 70–99)

## 2011-03-25 LAB — GLUCOSE, CAPILLARY
Glucose-Capillary: 152 mg/dL — ABNORMAL HIGH (ref 70–99)
Glucose-Capillary: 173 mg/dL — ABNORMAL HIGH (ref 70–99)

## 2011-03-25 MED ORDER — METOCLOPRAMIDE HCL 5 MG/ML IJ SOLN
10.0000 mg | Freq: Three times a day (TID) | INTRAMUSCULAR | Status: DC
  Administered 2011-03-25 – 2011-03-26 (×4): 10 mg via INTRAVENOUS
  Filled 2011-03-25 (×7): qty 2

## 2011-03-25 MED ORDER — POTASSIUM CHLORIDE CRYS ER 20 MEQ PO TBCR
20.0000 meq | EXTENDED_RELEASE_TABLET | Freq: Two times a day (BID) | ORAL | Status: DC
  Administered 2011-03-25 – 2011-03-27 (×5): 20 meq via ORAL
  Filled 2011-03-25 (×5): qty 1

## 2011-03-25 NOTE — Progress Notes (Signed)
Occupational Therapy Treatment Patient Details Name: Peter Nicholson MRN: [id number] DOB: [birth year] Today's Date: 03/25/2011  OT Assessment/Plan OT Assessment/Plan Comments on Treatment Session: Pt. moving well and very motivated for OOB activity. Pt. maintaining NWB Rt LE during mobility with mod verbal cues to ensure NWB. Pt. and pt's wife wanting to continue SNF search for increased rehab prior to D/C home. OT Plan: Discharge plan remains appropriate OT Frequency: Min 2X/week Follow Up Recommendations: Skilled nursing facility Equipment Recommended: Defer to next venue OT Goals Acute Rehab OT Goals OT Goal Formulation: With patient Time For Goal Achievement: 2 weeks ADL Goals Pt Will Perform Grooming: with modified independence;Sitting at sink;Supported ADL Goal: Grooming - Progress: Progressing toward goals Pt Will Perform Upper Body Bathing: with modified independence;Sitting at sink ADL Goal: Upper Body Bathing - Progress: Not addressed Pt Will Perform Lower Body Bathing: with modified independence;Sitting at sink ADL Goal: Lower Body Bathing - Progress: Not addressed Pt Will Perform Upper Body Dressing: with modified independence;Sitting, chair ADL Goal: Upper Body Dressing - Progress: Progressing toward goals Pt Will Perform Lower Body Dressing: with modified independence;Sit to stand from chair;Supported;with adaptive equipment ADL Goal: Lower Body Dressing - Progress: Progressing toward goals Pt Will Transfer to Toilet: with modified independence;Stand pivot transfer;with DME;3-in-1 ADL Goal: Toilet Transfer - Progress: Progressing toward goals Pt Will Perform Toileting - Clothing Manipulation: with modified independence;Sitting on 3-in-1 or toilet ADL Goal: Toileting - Clothing Manipulation - Progress: Not addressed Pt Will Perform Toileting - Hygiene: with modified independence;Sit to stand from 3-in-1/toilet ADL Goal: Toileting - Hygiene - Progress: Not  addressed Miscellaneous OT Goals Miscellaneous OT Goal #1: pt will perform bed mobility without rails/trapeze bar MIn A with HOB 20 degrees or less as precursor to ADLS OT Goal: Miscellaneous Goal #1 - Progress: Progressing toward goals  OT Treatment Precautions/Restrictions  Precautions Precautions: Fall Restrictions Weight Bearing Restrictions: Yes RLE Weight Bearing: Non weight bearing   ADL ADL Eating/Feeding: Performed;Set up Where Assessed - Eating/Feeding: Chair Grooming: Performed;Wash/dry face;Set up Where Assessed - Grooming: Sitting, bed Upper Body Bathing: Not assessed Lower Body Bathing: Not assessed Upper Body Dressing: Performed;Minimal assistance Upper Body Dressing Details (indicate cue type and reason): with donning gown Where Assessed - Upper Body Dressing: Sitting, bed Lower Body Dressing: Performed;+1 Total assistance Lower Body Dressing Details (indicate cue type and reason): With donning Left shoe Where Assessed - Lower Body Dressing: Sitting, bed Toilet Transfer: +2 Total assistance;Comment for patient % (pt=70%) Toilet Transfer Details (indicate cue type and reason): Mod verbal cues for hand placement and technique Toilet Transfer Method: Stand pivot Toilet Transfer Equipment: Other (comment) (recliner) ADL Comments: Educated pt. and pt's wife on NWB Rt. LE  Mobility  Bed Mobility Bed Mobility: Yes Supine to Sit: 1: +2 Total assist;Patient percentage (comment);With rails;HOB elevated (Comment degrees) (pt=60%, HOB ~35) Supine to Sit Details (indicate cue type and reason): Cues for use of UEs and movement of LEs.  +2 used for trunk .   Sitting - Scoot to Edge of Bed: 4: Min assist Sitting - Scoot to [place] of Bed Details (indicate cue type and reason): cues for reciprocal scoot  Scooting to [facility]: 4: Min assist;With trapeze Transfers Transfers: Yes Sit to Stand: 1: +2 Total assist;Patient percentage (comment);From bed (p=70%) Sit to Stand Details  (indicate cue type and reason): Mod verbal cues for use of UE's  Stand to Sit: To chair/3-in-1;1: +2 Total assist;Patient percentage (comment);With upper extremity assist (pt 70%) Stand to Sit Details: cues to reach  back for armrests and to control descent Exercises    End of Session OT - End of Session Equipment Utilized During Treatment: Gait belt Activity Tolerance: Patient tolerated treatment well Patient left: in chair;with call bell in reach Nurse Communication: Mobility status for ambulation;Mobility status for transfers General Behavior During Session: [facility] for tasks performed Cognition: [facility] for tasks performed  Carlinda Ohlson,OTR/L Pager [phone]  03/25/2011, 9:28 AM

## 2011-03-25 NOTE — Progress Notes (Signed)
Discussed in the long length of stay meeting Peter Nicholson 03/25/2011  

## 2011-03-25 NOTE — Progress Notes (Signed)
Physical Therapy Treatment Patient Details Name: Peter Nicholson MRN: [id number] DOB: [birth year] Today's Date: 03/25/2011  PT Assessment/Plan  PT - Assessment/Plan Comments on Treatment Session: pt very motivated, but noting increased pain post-op.   PT Plan: Discharge plan remains appropriate PT Frequency: Min 5X/week Follow Up Recommendations: Skilled nursing facility Equipment Recommended: Defer to next venue PT Goals  Acute Rehab PT Goals PT Goal: Supine/Side to Sit - Progress: Progressing toward goal PT Goal: Sit to Supine/Side - Progress: Progressing toward goal PT Transfer Goal: Bed to Chair/Chair to Bed - Progress: Progressing toward goal PT Goal: Ambulate - Progress: Progressing toward goal PT Goal: Up/Down Stairs - Progress: Progressing toward goal Additional Goals PT Goal: Additional Goal #1 - Progress: Progressing toward goal  PT Treatment Precautions/Restrictions  Precautions Precautions: Fall Required Braces or Orthoses:  (external fixator in place) Restrictions Weight Bearing Restrictions: Yes RLE Weight Bearing: Non weight bearing Mobility (including Balance) Bed Mobility Bed Mobility: Yes Supine to Sit: 1: +2 Total assist;Patient percentage (comment);With rails;HOB elevated (Comment degrees) (pt 60%, HOB ~35degrees) Supine to Sit Details (indicate cue type and reason): Cues for use of UEs and movement of LEs.  +2 used for trunk .   Sitting - Scoot to Edge of Bed: 3: Mod assist Sitting - Scoot to Edge of Bed Details (indicate cue type and reason): cues for reciprocal scoot  Transfers Transfers: Yes Sit to Stand: 1: +2 Total assist;Patient percentage (comment);From bed;With upper extremity assist (pt 70%) Sit to Stand Details (indicate cue type and reason): cues for NWBing of R LE Stand to Sit: To chair/3-in-1;1: +2 Total assist;Patient percentage (comment);With upper extremity assist (pt 70%) Stand to Sit Details: cues to reach back for armrests and to control  descent Stand Pivot Transfers: 1: +2 Total assist;Patient percentage (comment) (pt 70%) Stand Pivot Transfer Details (indicate cue type and reason): cues for movement of RW and hop-to gait pattern Ambulation/Gait Ambulation/Gait: No    Exercise    End of Session PT - End of Session Equipment Utilized During Treatment: Gait belt Activity Tolerance: Patient tolerated treatment well;Patient limited by fatigue Patient left: in chair Nurse Communication: Mobility status for transfers General Behavior During Session: [facility] for tasks performed Cognition: [facility] for tasks performed  Sunny Schlein, [place] [phone] 03/25/2011, 9:26 AM

## 2011-03-25 NOTE — Op Note (Signed)
NAME:  Peter Nicholson, Peter Nicholson                     ACCOUNT NO.:  MEDICAL RECORD NO.:  [id number]  LOCATION:                                 FACILITY:  PHYSICIAN:  Doralee Albino. Carola Frost, [name] DATE OF BIRTH:  November 05, 1940  DATE OF PROCEDURE:  03/24/2011 DATE OF DISCHARGE:                              OPERATIVE REPORT   PREOPERATIVE DIAGNOSES: 1. Right bicondylar tibial plateau fracture. 2. Retained external fixator.  POSTOPERATIVE DIAGNOSES: 1. Right bicondylar tibial plateau fracture. 2. Retained external fixator.  PROCEDURES: 1. Open reduction and internal fixation of right bicondylar tibial     plateau. 2. Removal of external fixator under anesthesia. 3. Anterior compartment fasciotomy.  SURGEON:  Doralee Albino. Carola Frost, [name]  ASSISTANT:  Mearl Latin, PA-C.  ANESTHESIA:  General.  COMPLICATIONS:  None.  DRAINS:  None.  SPECIMENS:  None.  TOURNIQUET:  None.  URINARY OUTPUT:  A 1000 mL crystalloid, blood 200 mL, urine 350 mL.  DISPOSITION:  To PACU.  CONDITION:  Stable.  BRIEF SUMMARY AND INDICATION FOR PROCEDURE:  Peter Nicholson is a 70 year old male, status post bicondylar tibial plateau fracture treated initially with spanning external fixation.  His postoperative course was complicated by ileus and hypokalemia among others.  As a result, he stayed in the hospital where he underwent evaluation and management by the Medical Service.  He now presents for definitive internal fixation with his soft tissue swelling having resolved considerably.  We did discuss preoperative risks and benefits of surgery including the possibility of infection, nerve injury, vessel injury, DVT, PE, heart attack, stroke, malunion, arthritis, symptomatic hardware, compartment syndrome, need further surgery and multiple others, and again he did wish to proceed.  BRIEF DESCRIPTION OF PROCEDURE:  Peter Nicholson was given 2 g of Ancef preoperatively, taken to operating room where general anesthesia was induced.   His right lower extremity was then draped to isolate the fixator.  We scrubbed with chlorhexidine and then removed.  The pin sites were then scrubbed aggressively with chlorhexidine scrub brush and fixator had been in place for short period of time.  No formal curettage of the bone canal was required.  A standard prep and drape was then performed.  An anterolateral incision was then made.  The retinaculum was incised.  Proximal to the joint, coronary ligament released from insertion on the tibial plateau.  It was reflected proximally and no tears were identified within the meniscus, which was well preserved similarly the articular surface appeared to be in overall reasonable condition.  The knee was evacuated of considerable hematoma, which accounts for the majority of her blood loss.  It was irrigated thoroughly as well.  There were no large areas of articular step-off. My assistant, Montez Morita, then pull traction to enable reduction alignment of the joint.  The appropriate plate was then selected and placed along the anterolateral tibia.  The proximal tibia was exposed thorough release of the insertion of the extensors, which were kept attached posteriorly by reflected from their anterior insertion.  The plate was then secured in the appropriate position with a Darrick Penna clamp through a percutaneous medial incision as well as K-wires.  This plate  position was adjusted.  I did also apply a anterior-to-posterior sharp tenaculum clamp to get the posterolateral piece maximally apposed and compressed to the rest of the lateral plateau.  Once this was performed, then a standard screw was placed across the plateau lagging the compartments together and this was then followed by a multiple lock screws across the plateau and then a standard screw in the shaft and ultimately a 6 distal cortices and 7 proximal screws.  Following this, I then made a separate distal extension of the incision developed  the interval of both the superficial and deep to the anterior compartment fascia and performed a extensive fasciotomy to reduce the chance of postoperative compartment syndrome.  My assistant, Montez Morita, participated and assisted me throughout with all portions of the procedure, including simultaneous removal of the fixator, exposure, maintenance of reduction with repair of the plateaus, and the procedure could not be completed without assistance effectively.  PROGNOSIS:  Peter Nicholson will have unrestricted early range of motion.  He does not appear to have significant ligamentous injury and will be allowed to mobilize without a knee brace.  He will be nonweightbearing for the next 8 weeks with graduated weightbearing thereafter.  He is at increased risk for recurrent ileus and electrolyte abnormalities given his previous hospital course.  We are hopeful that these will be medicated by continued increase in his activities.     Doralee Albino. Carola Frost, [name]     MHH/MEDQ  D:  03/24/2011  T:  03/24/2011  Job:  [id number]

## 2011-03-25 NOTE — Progress Notes (Signed)
Subjective: 1 Day Post-Op Procedure(s) (LRB): OPEN REDUCTION INTERNAL FIXATION (ORIF) TIBIAL PLATEAU (Right)    Doing ok Up in chair Using PCA Getting ready to eat breakfast  Objective: Current Vitals Blood pressure 166/67, pulse 109, temperature 97.7 F (36.5 C), temperature source Oral, resp. rate 16, height 5\' 7"  (1.702 m), weight 114.306 kg (252 lb), SpO2 99.00%. Vital signs in last 24 hours: Temp:  [97.4 F (36.3 C)-98.6 F (37 C)] 97.7 F (36.5 C) (12/12 0627) Pulse Rate:  [64-109] 109  (12/12 0627) Resp:  [14-18] 16  (12/12 0627) BP: (104-189)/(48-93) 166/67 mmHg (12/12 0627) SpO2:  [99 %-100 %] 99 % (12/12 0627) Weight:  [114.306 kg (252 lb)] 252 lb (114.306 kg) (12/12 0627)  Intake/Output from previous day: 12/11 0701 - 12/12 0700 In: 2827.5 [P.O.:690; I.V.:2137.5] Out: 1475 [Urine:1275; Blood:200]  LABS  Basename 03/24/11 1213 03/23/11 0911  HGB 9.1* 10.1*    Basename 03/24/11 1213 03/23/11 0911  WBC 14.3* 10.6*  RBC 2.77* 3.15*  HCT 27.2* 31.1*  PLT 433* 434*    Basename 03/24/11 1213 03/23/11 0617  NA 135 135  K 4.0 3.9  CL 104 105  CO2 26 19  BUN 11 12  CREATININE 1.03 0.96  GLUCOSE 205* 177*  CALCIUM 8.6 9.0   No results found for this basename: LABPT:2,INR:2 in the last 72 hours    Physical Exam  Gen: appears well, NAD Lungs: Clear Cardiac: reg, S1 and S2 Abd: + BS Ext: R lower extremity  dsg and brace fitting well  Extremity is warm  + DP pulse  DPN, SPN, TN sensation intact  EHL, FHL, AT, PT, Peroneals, gastroc motor intact  No pain with passive stretch      L LEx does exhibit some swelling as well  Imaging Dg C-arm 1-60 Min  03/24/2011  CLINICAL DATA: RIght tibial plateau fracture   C-ARM 1-60 MINUTES  Fluoroscopy was utilized by the requesting physician.  No radiographic  interpretation.     Dg Knee 2 Views Right  03/24/2011  *RADIOLOGY REPORT*  Clinical Data: Tibial plateau fracture  RIGHT KNEE - 3 VIEW  Comparison:  None.  Findings: Series of images demonstrate a plate and screws transfixing the right tibial plateau fracture.  Anatomic alignment. No breakage or loosening of the hardware.  IMPRESSION: ORIF right tibial plateau fracture.  Original Report Authenticated By: Donavan Burnet, [name]    Assessment/Plan: 1 Day Post-Op Procedure(s) (LRB): OPEN REDUCTION INTERNAL FIXATION (ORIF) TIBIAL PLATEAU (Right)  70 y/o male s/p fall  1. Bicondylar R tibial plateau fracture POD 1   NWB x 8 weeks  Hinged brace, allow ROM with PT/OT  Cont with PT/OT  dsg change tom  Ice and elevate 2. Ileus   Improving, resolved  Monitor closely due to surgery  3. Hypomagnesemia   resolved  4. Hypokalemia   resolved  5. Medical issues   Stable   Continue to monitor DM 6. TIA   ASA  7. DVT/PE prophylaxis   lovenox  8. Pain  PCA today   PO meds  9. Diet   Heart healthy  10. Activity   Continue with PT/OT  11. Dispo    Once pain controlled post op and mobilizing well will d/c to snf   SW eval for snf search 12. Misc  Numerous orders such as a cbc, bmet, xrays, scheduled reglan, to name a few were still in the "to be released section" of the signed and held orders.  Need to continue  to discuss with nsg the importance of looking for signed and held orders on pts being transferred back to the floor.  I did go in and release these orders    Mearl Latin, PA-C 03/25/2011, 8:44 AM

## 2011-03-26 ENCOUNTER — Encounter (HOSPITAL_COMMUNITY): Payer: Self-pay | Admitting: Orthopedic Surgery

## 2011-03-26 LAB — GLUCOSE, CAPILLARY
Glucose-Capillary: 142 mg/dL — ABNORMAL HIGH (ref 70–99)
Glucose-Capillary: 150 mg/dL — ABNORMAL HIGH (ref 70–99)
Glucose-Capillary: 217 mg/dL — ABNORMAL HIGH (ref 70–99)
Glucose-Capillary: 203 mg/dL — ABNORMAL HIGH (ref 70–99)

## 2011-03-26 LAB — CBC
HCT: 27.7 % — ABNORMAL LOW (ref 39.0–52.0)
Hemoglobin: 8.8 g/dL — ABNORMAL LOW (ref 13.0–17.0)
MCH: 31.7 pg (ref 26.0–34.0)
MCHC: 31.8 g/dL (ref 30.0–36.0)
MCV: 99.6 fL (ref 78.0–100.0)

## 2011-03-26 LAB — BASIC METABOLIC PANEL
BUN: 8 mg/dL (ref 6–23)
CO2: 26 mEq/L (ref 19–32)
Chloride: 102 mEq/L (ref 96–112)
GFR calc non Af Amer: 86 mL/min — ABNORMAL LOW (ref >90)
Glucose, Bld: 134 mg/dL — ABNORMAL HIGH (ref 70–99)
Potassium: 4.2 mEq/L (ref 3.5–5.1)

## 2011-03-26 NOTE — Progress Notes (Signed)
Physical Therapy Treatment Patient Details Name: Peter Nicholson MRN: [id number] DOB: 05-31-1940 Today's Date: 03/26/2011  PT Assessment/Plan  PT - Assessment/Plan Comments on Treatment Session: pt very motivated and feeling better today.  pt does great maintaining NWBing.  Hinged knee brace has not been delivered yet, so no ROM to R knee yet.   PT Plan: Discharge plan remains appropriate PT Frequency: Min 5X/week Follow Up Recommendations: Skilled nursing facility Equipment Recommended: Defer to next venue PT Goals  Acute Rehab PT Goals PT Goal: Supine/Side to Sit - Progress: Progressing toward goal PT Goal: Sit to Supine/Side - Progress: Progressing toward goal PT Transfer Goal: Bed to Chair/Chair to Bed - Progress: Progressing toward goal PT Goal: Ambulate - Progress: Progressing toward goal PT Goal: Up/Down Stairs - Progress: Progressing toward goal Additional Goals PT Goal: Additional Goal #1 - Progress: Partly met  PT Treatment Precautions/Restrictions  Precautions Precautions: Fall Required Braces or Orthoses:  (external fixator in place) Restrictions Weight Bearing Restrictions: Yes RLE Weight Bearing: Non weight bearing Mobility (including Balance) Bed Mobility Bed Mobility: Yes Supine to Sit: 3: Mod assist;With rails Supine to Sit Details (indicate cue type and reason): cues for encouragement Sitting - Scoot to Edge of Bed: 4: Min assist Transfers Transfers: Yes Sit to Stand: 4: Min assist;With upper extremity assist;From bed Sit to Stand Details (indicate cue type and reason): cues for UE use and encouragement Stand to Sit: 4: Min assist;With upper extremity assist;To chair/3-in-1 Stand to Sit Details: cues for positioning of R LE and to reach back for armrests Ambulation/Gait Ambulation/Gait: Yes Ambulation/Gait Assistance: 4: Min assist Ambulation/Gait Assistance Details (indicate cue type and reason): cues for safe use of RW.  Good maintaining NWBing.  Pulled  recliner behind [place].   Ambulation Distance (Feet): 20 Feet Assistive device: Rolling walker Gait Pattern:  (Hop-to) Stairs: No Wheelchair Mobility Wheelchair Mobility: No    Exercise  General Exercises - Lower Extremity Ankle Circles/Pumps: AROM;Right;20 reps;Seated End of Session PT - End of Session Equipment Utilized During Treatment: Gait belt Activity Tolerance: Patient tolerated treatment well;Patient limited by fatigue Patient left: in chair;with call bell in reach Nurse Communication: Mobility status for ambulation;Mobility status for transfers General Behavior During Session: [facility] for tasks performed Cognition: [facility] for tasks performed  Sunny Schlein, [place] [phone] 03/26/2011, 1:13 PM

## 2011-03-26 NOTE — Progress Notes (Signed)
Subjective: 2 Days Post-Op Procedure(s) (LRB): OPEN REDUCTION INTERNAL FIXATION (ORIF) TIBIAL PLATEAU (Right) Doing much better Pain controlled Waiting for bed offer   Objective: Current Vitals Blood pressure 129/71, pulse 93, temperature 98 F (36.7 C), temperature source Oral, resp. rate 18, height 5\' 7"  (1.702 m), weight 115.259 kg (254 lb 1.6 oz), SpO2 99.00%. Vital signs in last 24 hours: Temp:  [98 F (36.7 C)-98.8 F (37.1 C)] 98 F (36.7 C) (12/13 0558) Pulse Rate:  [89-103] 93  (12/13 0558) Resp:  [16-19] 18  (12/13 0558) BP: (129-149)/(62-71) 129/71 mmHg (12/13 0558) SpO2:  [98 %-100 %] 99 % (12/13 0558) Weight:  [115.259 kg (254 lb 1.6 oz)] 254 lb 1.6 oz (115.259 kg) (12/13 0558)  Intake/Output from previous day: 12/12 0701 - 12/13 0700 In: 2128.2 [P.O.:1025; I.V.:1099.2; IV Piggyback:4] Out: 1700 [Urine:1700]  LABS  Basename 03/26/11 0655 03/25/11 0940 03/24/11 1213  HGB 8.8* 8.9* 9.1*    Basename 03/26/11 0655 03/25/11 0940  WBC 9.4 11.3*  RBC 2.78* 2.73*  HCT 27.7* 27.3*  PLT 349 348    Basename 03/26/11 0655 03/25/11 0940  NA 136 139  K 4.2 4.7  CL 102 105  CO2 26 26  BUN 8 10  CREATININE 0.83 1.03  GLUCOSE 134* 213*  CALCIUM 8.9 8.7   No results found for this basename: LABPT:2,INR:2 in the last 72 hours    Physical Exam  Gen: NAD Lungs:Clear Cardiac:reg Abd:+ BS, NT Ext: Right Lower Extremity  Incisions look fantastic, scant drainage  No erythema or other signs of infection  Distal motor and sensory functions intact  Extremity is warm  +DP pulse  No DCT  Compartments soft and NT  Dsg changes and new ace placed  No hinge brace yet    Assessment/Plan: 2 Days Post-Op Procedure(s) (LRB): OPEN REDUCTION INTERNAL FIXATION (ORIF) TIBIAL PLATEAU (Right)  70 y/o male s/p fall   1. Bicondylar R tibial plateau fracture POD 2  NWB x 8 weeks   Hinged brace, allow ROM with PT/OT   Cont with PT/OT   Ice and elevate   Dressing  changed today 2. Ileus   Improving, resolved   Monitor closely due to surgery  3. Hypomagnesemia   resolved  4. Hypokalemia   resolved  5. Medical issues   Stable   Continue to monitor DM  6. TIA   ASA  7. DVT/PE prophylaxis   lovenox  8. Pain   D/c PCA today   PO meds  9. Diet   Heart healthy   NSL IV 10. Activity   Continue with PT/OT  11. Dispo   Ready for SNF when bed available    Mearl Latin, PA-C 03/26/2011, 9:55 AM

## 2011-03-27 LAB — MAGNESIUM: Magnesium: 1.7 mg/dL (ref 1.5–2.5)

## 2011-03-27 LAB — BASIC METABOLIC PANEL
BUN: 9 mg/dL (ref 6–23)
CO2: 27 mEq/L (ref 19–32)
CO2: 28 mEq/L (ref 19–32)
Calcium: 9.6 mg/dL (ref 8.4–10.5)
Calcium: 9.2 mg/dL (ref 8.4–10.5)
Chloride: 100 mEq/L (ref 96–112)
Creatinine, Ser: 1.01 mg/dL (ref 0.50–1.35)
GFR calc non Af Amer: 68 mL/min — ABNORMAL LOW (ref >90)
Glucose, Bld: 178 mg/dL — ABNORMAL HIGH (ref 70–99)
Glucose, Bld: 187 mg/dL — ABNORMAL HIGH (ref 70–99)
Potassium: 3.8 mEq/L (ref 3.5–5.1)
Sodium: 135 mEq/L (ref 135–145)

## 2011-03-27 LAB — TYPE AND SCREEN

## 2011-03-27 LAB — CBC
HCT: 27.2 % — ABNORMAL LOW (ref 39.0–52.0)
Hemoglobin: 8.8 g/dL — ABNORMAL LOW (ref 13.0–17.0)
MCH: 31.8 pg (ref 26.0–34.0)
MCHC: 32 g/dL (ref 30.0–36.0)
MCV: 99.3 fL (ref 78.0–100.0)
Platelets: 382 10*3/uL (ref 150–400)
RBC: 2.77 MIL/uL — ABNORMAL LOW (ref 4.22–5.81)
RDW: 13.3 % (ref 11.5–15.5)
WBC: 8.7 10*3/uL (ref 4.0–10.5)
WBC: 8.6 10*3/uL (ref 4.0–10.5)

## 2011-03-27 LAB — GLUCOSE, CAPILLARY: Glucose-Capillary: 169 mg/dL — ABNORMAL HIGH (ref 70–99)

## 2011-03-27 MED ORDER — OXYCODONE HCL 5 MG PO TABS: 5.0000 mg | ORAL_TABLET | ORAL | Status: AC | PRN

## 2011-03-27 MED ORDER — LISINOPRIL 10 MG PO TABS: 10.0000 mg | ORAL_TABLET | Freq: Every day | ORAL | Status: DC

## 2011-03-27 MED ORDER — ENOXAPARIN SODIUM 40 MG/0.4ML ~~LOC~~ SOLN: 40.0000 mg | SUBCUTANEOUS | Status: AC

## 2011-03-27 MED ORDER — METHOCARBAMOL 500 MG PO TABS: 500.0000 mg | ORAL_TABLET | Freq: Four times a day (QID) | ORAL | Status: AC

## 2011-03-27 MED ORDER — POTASSIUM CHLORIDE CRYS ER 20 MEQ PO TBCR: 20.0000 meq | EXTENDED_RELEASE_TABLET | Freq: Two times a day (BID) | ORAL | Status: DC

## 2011-03-27 MED ORDER — HYDROCODONE-ACETAMINOPHEN 5-325 MG PO TABS: 1.0000 | ORAL_TABLET | Freq: Four times a day (QID) | ORAL | Status: AC | PRN

## 2011-03-27 MED ORDER — PANTOPRAZOLE SODIUM 40 MG PO TBEC: 40.0000 mg | DELAYED_RELEASE_TABLET | Freq: Every day | ORAL | Status: DC

## 2011-03-27 NOTE — Discharge Summary (Signed)
ORTHOPAEDIC TRAUMA SERVICE DISCHARGE SUMMARY  Patient ID: Peter Nicholson MRN: [id number] DOB/AGE: [id number] 71 y.o.  Admit date: 03/09/2011 Discharge date: 03/27/2011  Admission Diagnoses: Fall from ladder Closed right tibial plateau fracture, bicondylar Hypertension Cerebrovascular disease Diabetes mellitus Gout History of renal cell carcinoma status post nephrectomy Sleep apnea Obesity Vitamin B12 deficiency Hiatal hernia Esophageal stricture Diverticulosis DJD Back pain Venous insufficiency   Additional Diagnoses in hospital: Acute blood loss anemia Ileus TIA Hypokalemia- resolved Hypomagnesemia-resolved    Discharge Diagnoses:  Principal Problem:  *Closed fracture of tibial plateau Active Problems:  DIABETES MELLITUS  HYPERCHOLESTEROLEMIA  ANXIETY  HYPERTENSION  CEREBROVASCULAR DISEASE  VENOUS INSUFFICIENCY  GERD  TIA (transient ischemic attack)  Acute blood loss anemia  Procedures 03/10/2011: External fixation right bicondylar tibial plateau fracture by Dr. Casimiro Needle handy 03/24/2011: ORIF right bicondylar tibial plateau fracture by Dr. Carola Frost   Discharged Condition: stable  Hospital Course: Peter Nicholson is a very pleasant 70 year old Caucasian male with an extensive medical history most notable for nephrectomy back in 1994 for renal cell carcinoma, diabetes mellitus and hypertension who was cleaning his gutters on 03/09/11. Patient was descending his ladder he missed the bottom 3 lungs, lost his footing and fell to the ground below. Patient landed on his right leg and hip. He states that his right knee buckled out from under him resulting in immediate onset of pain and inability to bear weight. Patient was brought to [facility] for evaluation where she was found to have a complex right tibial plateau fracture. Patient was admitted to the orthopaedic floor for observation and pain control. The orthopedic trauma service was asked to see the patient  given the significant complexity of this injury. Orthopedist on call last night felt that this particular injury needed the particular skill set of an orthopedic traumatologist to give the patient the best opportunity for full recovery. On 03/10/2011 the patient was brought to the operating room for external fixation of his right leg as his swelling prevented early operative intervention. We felt that the patient needed a soft tissue holiday to allow some swelling to resolve before proceeding with open reduction internal fixation. After surgery patient was doing well on postop day #1 however on postoperative day #2 there were some mental status changes noted on and given his history of cerebral vascular disease we ordered a CT angiogram of the head and neck which were essentially negative however again given his history we did attain a neurology consult who felt that the patient did indeed have a transient ischemic attack. As such the patient was continued on his 325 mg aspirin daily and he was also on Lovenox immediately postoperatively as well for DVT/PE prophylaxis initially we started 30 mg subcutaneous daily as his creatinine clearance is unknown given his nephrectomy but once his creatinine clearance is calculated we did bump him up to 40 mg subcutaneous daily. Patient did not have apparent sequela from his TIA and continued to remain in the hospital for a couple days while he was monitored. However shortly thereafter he began to develop some abdominal distention and discomfort along with nausea and vomiting abdominal plain film was obtained which did demonstrate an ileus type pattern. GI was consult and for assistance in management of his ileus. Medicine was also consult and to assist with medical management given the fact that he did have profound hypokalemia and hypomagnesemia which were likely contributing to his ileus. Ultimately patient did end up having a rectal tube placed as he was  having very slow  resolution of his bowel gas pattern. This lasted for approximately a day and the patient's ileus did resolve in about 5 days or so. Also during his hospitalization patient was found to be significantly hypertensive. He was on no antihypertensive medications prior to admission so we did start him on hydrochlorothiazide at first. This was ineffective and we then added lisinopril. Once he began to develop hypokalemia we did discontinue his hydrochlorothiazide and actually up his dose of lisinopril to 10 mg daily. This appeared to be effective for this patient. Once the patient was deemed stable from a medical standpoint his soft tissue swelling had nearly resolved and felt that discharge to skilled nursing facility for a day or 2 would be unwarranted. Therefore we kept the patient is a patient with scheduled surgery for 03/24/2011. The patient tolerated the procedure very well and and did not have any additional perioperative complications from the second procedure. All through his hospital stay Peter Nicholson did work with physical therapy on a regular basis. He mobilize very well initially with his external fixator and then mobilize very well after definitive fixation was completed. On postoperative day #3 from the second procedure hospital day 18 patient was deemed stable for discharge to skilled nursing facility. We did not take this decision lightly as the patient initially wanted to return home however he, as well as the orthopaedic service,felt that it would be difficult on the patient's wife to adequately care for him given his limited mobility. Therefore we decided that a skilled nursing facility would be the best option for all parties involved.  Consults: GI, neurology and Internal medicine   Significant Diagnostic Studies: At the time of discharge all labs and diagnostic studies are stable  Treatments: IV hydration, antibiotics: Ancef, analgesia: Morphine and Percocet, oxycodone, anticoagulation: ASA  and LMW heparin, insulin: Sliding scale (NovoLog), therapies: PT, OT and RN, procedures: Rectal tube, has since been removed and surgery: Please see above  Discharge Exam:  Subjective:   3 Days Post-Op Procedure(s) (LRB):  OPEN REDUCTION INTERNAL FIXATION (ORIF) TIBIAL PLATEAU (Right)   Patient is doing well.  No complaints of pain.  Denies any chest pain, no shortness of breath.  Denies any numbness or tingling.  Tolerating diet  Positive bowel movement  Abdomen is nontender and no abdominal pain  Still awaiting word from the nursing facility.  working well with physical therapy.  Patient still has not received his hinged knee brace, order written 2 days ago   Objective:  Current Vitals  Blood pressure 152/69, pulse 112, temperature 98.8 F (37.1 C), temperature source Oral, resp. rate 18, height 5\' 7"  (1.702 m), weight 114.9 kg (253 lb 4.9 oz), SpO2 92.00%.   Vital signs in last 24 hours:  Temp: [98.8 F (37.1 C)-98.9 F (37.2 C)] 98.8 F (37.1 C) (12/14 0430)  Pulse Rate: [90-112] 112 (12/14 0430)  Resp: [18-20] 18 (12/14 0430)  BP: (130-152)/(69-76) 152/69 mmHg (12/14 0430)  SpO2: [92 %-99 %] 92 % (12/14 0430)  Weight: [114.9 kg (253 lb 4.9 oz)] 253 lb 4.9 oz (114.9 kg) (12/14 0430)   Intake/Output from previous day:  12/13 0701 - 12/14 0700  In: 820 [P.O.:720; I.V.:100]  Out: 2277 [Urine:2275; Stool:2]   LABS   Basename  03/27/11 0650  03/26/11 0655  03/25/11 0940  03/24/11 1213   HGB  8.8*  8.8*  8.9*  9.1*     Basename  03/27/11 0650  03/26/11 0655   WBC  8.7  9.4   RBC  2.77*  2.78*   HCT  27.5*  27.7*   PLT  382  349     Basename  03/27/11 0650  03/26/11 0655   NA  135  136   K  3.8  4.2   CL  100  102   CO2  27  26   BUN  9  8   CREATININE  1.07  0.83   GLUCOSE  178*  134*   CALCIUM  9.6  8.9    No results found for this basename: LABPT:2,INR:2 in the last 72 hours  Magnesium: 1.7   Physical Exam   Gen: No acute distress, sitting up in  bed, appears comfortable  Lungs: Clear  Cardiac: Regular, S1 and S2 noted  Abd: Positive bowel sounds, nontender  Ext: Right lower extremity   Patient still lacking hinge knee brace, regular long-leg knee immobilizer is in place   Dressing is stable, clean/dry/intact   Distal motor and sensory functions are intact with respect to the deep peroneal nerve, superficial peroneal nerve, tibial nerve   Patient does perform a good quad set as well and can actually do a straight leg raise.   Extremity is warm with a palpable dorsalis pedis pulse   No deep calf tenderness is noted   No pain with passive stretching   Imaging  Dg Knee Right Port  03/25/2011 *RADIOLOGY REPORT* Clinical Data: Comminuted proximal tibia fracture. PORTABLE RIGHT KNEE - 1-2 VIEW Comparison: Radiographs dated 11/27 and 03/24/2011 Findings: The patient has undergone open reduction and internal fixation of the comminuted tibial plateau and tibial metaphyseal fractures. There has been significant improvement in the angulation and position of the multiple fracture fragments. Small joint effusion. IMPRESSION: Open reduction and internal fixation of comminuted proximal tibia fracture. Slight residual depression of the lateral tibial plateau posteriorly. Original Report Authenticated By: Gwynn Burly, [name]   Assessment/Plan:   3 Days Post-Op Procedure(s) (LRB):  OPEN REDUCTION INTERNAL FIXATION (ORIF) TIBIAL PLATEAU (Right)   70 year old male status post fall approximately 18 days ago with hospitalization complicated by TIA and ileus along with additional medical comorbidities.   1. Right bicondylar tibial plateau fracture postoperative day #3   Nonweightbearing x8 weeks   Will reorder hinge knee brace again today   Patient may begin range of motion of his right knee on his own and with physical therapy.   Hinge brace will be unlocked to allow full range of motion   No pillows under knee at rest they may be placed under his  heel or ankle to allow full extension.   We would like to avoid the development of flexion contracture at all costs.   Patient continues to remain stable for discharge to skilled nursing facility.   Dressing changes as needed at this point.   Would recommend removing ACE wraps on a daily basis for skin check.   Ice and elevation as needed   Daily physical therapy sessions  2. TIA/cerebrovascular disease   No repeat event status post second operative procedure   Continue with aspirin   Patient should followup with neurology in 2-3 weeks   We'll continue to hold Plavix for at least another week or so or at least until his wound is completely healed.   Would continue to hold off on scheduled epidural injection as patient cannot be taken off of his aspirin at this time given recent TIA.  3. Ileus   Resolved, patient has good return  of bowel function and is tolerating diet well.  4. hypomagnesemia and hypokalemia   Resolve in stable, patient went through 7 day course of magnesium repletion, will check mag level before discharge   Patient on daily potassium supplementation as well   Will need periodic chemistry panel to assess any changes.  5. Hypertension   Well-controlled on lisinopril   Continue to monitor  6. Diabetes   Would continue sliding scale at the nursing home as patient apparently well-controlled at this   We will defer to the rehabilitation doctors as far as reimplementation of oral hypoglycemic agents.  7. hypercholesterolemia   stable, continue home meds  8. acute blood loss anemia   Stable and asymptomatic  9. Anxiety   Stable   Patient was on as needed benzos   Continue this as well.  10. GERD   Continue protonix  11. DVT/PE prophylaxis   Continue Lovenox for 21 days  12. FEN   Heart healthy diet  13. Activity   Nonweightbearing right lower extremity   Active, active-assisted and passive range of motion of right knee   Hinge knee brace her right knee   Ankle and  hip range of motion as tolerated right leg   Patient can work on short arc quads, long arc quads, quad sets, heel cord stretching, thera band activities for right ankle  14. Disposition   Stable for discharge to nursing facility when bed available   Hinge knee brace to right knee new order placed      Disposition: SNF   Current Discharge Medication List    START taking these medications   Details  enoxaparin (LOVENOX) 40 MG/0.4ML SOLN Inject 0.4 mLs (40 mg total) into the skin daily. Qty: 21 Syringe, Refills: 0    HYDROcodone-acetaminophen (NORCO) 5-325 MG per tablet Take 1-2 tablets by mouth every 6 (six) hours as needed for pain. Qty: 90 tablet, Refills: 0    lisinopril (PRINIVIL,ZESTRIL) 10 MG tablet Take 1 tablet (10 mg total) by mouth daily. Qty: 30 tablet, Refills: 0    methocarbamol (ROBAXIN) 500 MG tablet Take 1-2 tablets (500-1,000 mg total) by mouth 4 (four) times daily. Qty: 60 tablet, Refills: 0    oxyCODONE (OXY IR/ROXICODONE) 5 MG immediate release tablet Take 1-2 tablets (5-10 mg total) by mouth every 3 (three) hours as needed for pain. Qty: 90 tablet, Refills: 0    pantoprazole (PROTONIX) 40 MG tablet Take 1 tablet (40 mg total) by mouth daily at 12 noon. Qty: 30 tablet, Refills: 0    potassium chloride SA (K-DUR,KLOR-CON) 20 MEQ tablet Take 1 tablet (20 mEq total) by mouth 2 (two) times daily. Qty: 60 tablet, Refills: 0      CONTINUE these medications which have CHANGED   Details  pioglitazone (ACTOS) 30 MG tablet Take 1 tablet (30 mg total) by mouth daily.      CONTINUE these medications which have NOT CHANGED   Details  allopurinol (ZYLOPRIM) 300 MG tablet TAKE ONE TABLET BY MOUTH EVERY DAY Qty: 30 tablet, Refills: 5    ALPRAZolam (XANAX) 0.5 MG tablet Take 1/2 to 1 tablet by mouth three times daily as needed for nerves Qty: 90 tablet, Refills: 0    aspirin 325 MG EC tablet Take 325 mg by mouth daily.      docusate sodium (COLACE) 100 MG capsule  Take 100 mg by mouth daily.      glyBURIDE (DIABETA) 5 MG tablet Take 2 tablets by mouth two times daily Qty:  360 tablet, Refills: 3    metFORMIN (GLUCOPHAGE) 500 MG tablet TAKE TWO TABLETS BY MOUTH TWICE DAILY Qty: 360 tablet, Refills: 3    Multiple Vitamin (MULTIVITAMIN) tablet Take 1 tablet by mouth daily.           rosuvastatin (CRESTOR) 20 MG tablet Take 1 tablet (20 mg total) by mouth at bedtime. Qty: 30 tablet, Refills: 11    saxagliptin HCl (ONGLYZA) 5 MG TABS tablet Take 1 tablet (5 mg total) by mouth daily. Qty: 30 tablet, Refills: 11      STOP taking these medications     PLAVIX 75 MG tablet      polyethylene glycol powder (MIRALAX) powder        Follow-up Information    Follow up with HANDY,MICHAEL H in 14 days. (call for appointment )    Contact information:   [address] [place] [address] [phone]       Follow up with Lesly Dukes, MD in 2 weeks. (call for appointment)    Contact information:   [address] [place] [address] [facility] [place] [address] [phone]       Follow up with NADEL,SCOTT M, MD in 2 weeks. (call for appointment)    Contact information:   [facility] [address] [place] [address] [phone]         Discharge instructions and plan:  Peter Nicholson has sustained a fairly significant injury to his right lower extremity. We were able to achieve excellent fixation with open reduction internal fixation. He will be nonweightbearing for the next 8 weeks. However he will be allowed unrestricted range of motion of his right knee. He needs to participate with physical therapy on a daily basis. He needs to work on active, active assistive, passive range of motion of his right knee. He also needs to work on isometric leg strengthening of his right lower extremity as well to preserve as much strength as possible. Patient can  perform short arc quads, long arc quads, quad sets, heel cord stretching, thera-band activities, etc. Continue with ice and elevation as needed for pain and swelling control. I would recommend daily dressing changes as well as daily changes of his Ace wrap to at least assess his skin. No ointments lotions or solution should be applied to his operative wound as these may cause wound maceration and ultimately may cause them to dehisce which can then lead to infection.  Peter Nicholson will continue on aspirin and Lovenox. Both for TIA and DVT/PE prophylaxis. He will need a followup with his PCP as well as his neurologist in 2-3 weeks as well. I like to see the patient back in the office in 10-14 days for reevaluation, followup x-rays, removal of sutures. The goal is to have full extension at this visit of his right knee and to achieve 90 of flexion at 4 weeks postoperatively. Should the nursing home including the therapist any questions regarding his care and therapeutic exercises there her to contact the office. Patient can resume his heart healthy diet as well.   Signed: Mearl Latin, PA-C 03/27/2011, 9:12 AM

## 2011-03-27 NOTE — Progress Notes (Cosign Needed)
Subjective: 3 Days Post-Op Procedure(s) (LRB): OPEN REDUCTION INTERNAL FIXATION (ORIF) TIBIAL PLATEAU (Right)    Patient is doing well. No complaints of pain. Denies any chest pain, no shortness of breath. Denies any numbness or tingling. Tolerating diet Positive bowel movement Abdomen is nontender and no abdominal pain Still awaiting word from the nursing facility. working well with physical therapy. Patient still has not received his hinged knee brace, order written 2 days ago  Objective: Current Vitals Blood pressure 152/69, pulse 112, temperature 98.8 F (37.1 C), temperature source Oral, resp. rate 18, height 5\' 7"  (1.702 m), weight 114.9 kg (253 lb 4.9 oz), SpO2 92.00%. Vital signs in last 24 hours: Temp:  [98.8 F (37.1 C)-98.9 F (37.2 C)] 98.8 F (37.1 C) (12/14 0430) Pulse Rate:  [90-112] 112  (12/14 0430) Resp:  [18-20] 18  (12/14 0430) BP: (130-152)/(69-76) 152/69 mmHg (12/14 0430) SpO2:  [92 %-99 %] 92 % (12/14 0430) Weight:  [114.9 kg (253 lb 4.9 oz)] 253 lb 4.9 oz (114.9 kg) (12/14 0430)  Intake/Output from previous day: 12/13 0701 - 12/14 0700 In: 820 [P.O.:720; I.V.:100] Out: 2277 [Urine:2275; Stool:2]  LABS  Basename 03/27/11 0650 03/26/11 0655 03/25/11 0940 03/24/11 1213  HGB 8.8* 8.8* 8.9* 9.1*    Basename 03/27/11 0650 03/26/11 0655  WBC 8.7 9.4  RBC 2.77* 2.78*  HCT 27.5* 27.7*  PLT 382 349    Basename 03/27/11 0650 03/26/11 0655  NA 135 136  K 3.8 4.2  CL 100 102  CO2 27 26  BUN 9 8  CREATININE 1.07 0.83  GLUCOSE 178* 134*  CALCIUM 9.6 8.9   No results found for this basename: LABPT:2,INR:2 in the last 72 hours  Magnesium: 1.7   Physical Exam  Gen: No acute distress, sitting up in bed, appears comfortable Lungs: Clear Cardiac: Regular, S1 and S2 noted Abd: Positive bowel sounds, nontender Ext: Right lower extremity  Patient still lacking hinge knee brace, regular long-leg knee immobilizer is in place  Dressing is stable,  clean/dry/intact  Distal motor and sensory functions are intact with respect to the deep peroneal nerve, superficial peroneal nerve, tibial nerve  Patient does perform a good quad set as well and can actually do a straight leg raise.  Extremity is warm with a palpable dorsalis pedis pulse  No deep calf tenderness is noted  No pain with passive stretching    Imaging Dg Knee Right Port  03/25/2011  *RADIOLOGY REPORT*  Clinical Data: Comminuted proximal tibia fracture.  PORTABLE RIGHT KNEE - 1-2 VIEW  Comparison: Radiographs dated 11/27 and 03/24/2011  Findings: The patient has undergone open reduction and internal fixation of the comminuted tibial plateau and tibial metaphyseal fractures.  There has been significant improvement in the angulation and position of the multiple fracture fragments.  Small joint effusion.  IMPRESSION: Open reduction and internal fixation of comminuted proximal tibia fracture.  Slight residual depression of the lateral tibial plateau posteriorly.  Original Report Authenticated By: Gwynn Burly, [name]    Assessment/Plan: 3 Days Post-Op Procedure(s) (LRB): OPEN REDUCTION INTERNAL FIXATION (ORIF) TIBIAL PLATEAU (Right)  70 year old male status post fall approximately 18 days ago with hospitalization complicated by TIA and ileus along with additional medical comorbidities.  1. Right bicondylar tibial plateau fracture postoperative day #3  Nonweightbearing x8 weeks  Will reorder hinge knee brace again today  Patient may begin range of motion of his right knee on his own and with physical therapy.  Hinge brace will be unlocked to allow full  range of motion  No pillows under knee at rest they may be placed under his heel or ankle to allow full extension.  We would like to avoid the development of flexion contracture at all costs.  Patient continues to remain stable for discharge to skilled nursing facility.  Dressing changes as needed at this point.  Would recommend  removing ACE wraps on a daily basis for skin check.  Ice and elevation as needed  Daily physical therapy sessions  2. TIA/cerebrovascular disease  No repeat event status post second operative procedure  Continue with aspirin  Patient should followup with neurology in 2-3 weeks  We'll continue to hold Plavix for at least another week or so or at least until his wound is completely healed.  Would continue to hold off on scheduled epidural injection as patient cannot be taken off of his aspirin at this time given recent TIA.  3. Ileus  Resolved, patient has good return of bowel function and is tolerating diet well. 4. hypomagnesemia and hypokalemia  Resolve in stable, patient went through 7 day course of magnesium repletion, will check mag level before discharge  Patient on daily potassium supplementation as well  Will need periodic chemistry panel to assess any changes. 5. Hypertension  Well-controlled on lisinopril  Continue to monitor 6. Diabetes  Would continue sliding scale at the nursing home as patient apparently well-controlled at this  We will defer to the rehabilitation doctors as far as reimplementation of oral hypoglycemic agents.   7. hypercholesterolemia   stable, continue home meds 8. acute blood loss anemia  Stable and asymptomatic 9. Anxiety  Stable  Patient was on as needed benzos  Continue this as well.  10. GERD   Continue protonix 11. DVT/PE prophylaxis   Continue Lovenox for 21 days  12. FEN  Heart healthy diet 13. Activity  Nonweightbearing right lower extremity  Active, active-assisted and passive range of motion of right knee  Hinge knee brace her right knee  Ankle and hip range of motion as tolerated right leg  Patient can work on short arc quads, long arc quads, quad sets, heel cord stretching, thera band activities for right ankle   14. Disposition   Stable for discharge to nursing facility when bed available   Hinge knee brace to right knee new  order placed   Mearl Latin, PA-C 03/27/2011, 8:41 AM

## 2011-03-27 NOTE — Progress Notes (Signed)
Physical Therapy Treatment Patient Details Name: Peter Nicholson MRN: [id number] DOB: May 01, 1940 Today's Date: 03/27/2011  PT Assessment/Plan  PT - Assessment/Plan Comments on Treatment Session: pt continuing to make progress.  Still no hinged knee brace.  Per pt brace has been ordered, but has not been delivered.   PT Plan: Discharge plan remains appropriate PT Frequency: Min 5X/week Follow Up Recommendations: Skilled nursing facility Equipment Recommended: Defer to next venue PT Goals  Acute Rehab PT Goals PT Goal: Supine/Side to Sit - Progress: Progressing toward goal PT Goal: Sit to Supine/Side - Progress: Progressing toward goal PT Transfer Goal: Bed to Chair/Chair to Bed - Progress: Progressing toward goal PT Goal: Ambulate - Progress: Progressing toward goal PT Goal: Up/Down Stairs - Progress: Progressing toward goal Additional Goals PT Goal: Additional Goal #1 - Progress: Partly met  PT Treatment Precautions/Restrictions  Precautions Precautions: Fall Required Braces or Orthoses:  (external fixator in place) Restrictions Weight Bearing Restrictions: Yes RLE Weight Bearing: Non weight bearing Mobility (including Balance) Bed Mobility Bed Mobility: Yes Supine to Sit: 4: Min assist Supine to Sit Details (indicate cue type and reason): pt uses trapeze and only needs MinA for R LE only Sitting - Scoot to Edge of Bed: 4: Min assist Sitting - Scoot to [place] of Bed Details (indicate cue type and reason): A for R LE support only Transfers Transfers: Yes Sit to Stand: 4: Min assist;With upper extremity assist;From bed Sit to Stand Details (indicate cue type and reason): demos good use of UEs Stand to Sit: 4: Min assist;With upper extremity assist;To chair/3-in-1;With armrests Stand to Sit Details: cues to use armrests and control descent Ambulation/Gait Ambulation/Gait: Yes Ambulation/Gait Assistance: 4: Min assist Ambulation/Gait Assistance Details (indicate cue type and  reason): cues for encouragement and upright posture.   Ambulation Distance (Feet): 30 Feet Assistive device: Rolling walker Gait Pattern:  (Hop-to) Stairs: No Wheelchair Mobility Wheelchair Mobility: No    Exercise    End of Session PT - End of Session Equipment Utilized During Treatment: Gait belt Activity Tolerance: Patient tolerated treatment well;Patient limited by fatigue Patient left: in chair;with call bell in reach Nurse Communication: Mobility status for ambulation;Mobility status for transfers General Behavior During Session: [facility] for tasks performed Cognition: [facility] for tasks performed  Sunny Schlein, [place] [phone] 03/27/2011, 1:41 PM

## 2011-03-27 NOTE — Progress Notes (Signed)
OK per MD for d/c today to [facility] via EMS. Patient, wife, facility and nursing are aware.  They are pleased with d/c plan. DC summary and AVS sent to facility. PASARR number in place. No further needs identified.discontinued.  Darylene Price, BSW, 03/27/2011 12:46 PM

## 2011-03-27 NOTE — Progress Notes (Signed)
03/27/11 Nursing 1403 DC IV, DC Tele, DC to SNF. Discharge instructions and paperwork provided by Child psychotherapist. Patient denies any questions or concerns at this time. Patient leaving unit via ambulance and appears in no acute distress. Ernesta Amble, RN

## 2011-04-02 ENCOUNTER — Telehealth: Payer: Self-pay | Admitting: Pulmonary Disease

## 2011-04-02 NOTE — Telephone Encounter (Signed)
Error.Peter Nicholson ° °

## 2011-04-15 ENCOUNTER — Ambulatory Visit: Payer: Medicare Other | Admitting: Adult Health

## 2011-04-20 ENCOUNTER — Encounter: Payer: Self-pay | Admitting: Adult Health

## 2011-04-20 ENCOUNTER — Other Ambulatory Visit (INDEPENDENT_AMBULATORY_CARE_PROVIDER_SITE_OTHER): Payer: Medicare Other

## 2011-04-20 ENCOUNTER — Ambulatory Visit (INDEPENDENT_AMBULATORY_CARE_PROVIDER_SITE_OTHER): Payer: Medicare Other | Admitting: Adult Health

## 2011-04-20 DIAGNOSIS — K219 Gastro-esophageal reflux disease without esophagitis: Secondary | ICD-10-CM

## 2011-04-20 DIAGNOSIS — E119 Type 2 diabetes mellitus without complications: Secondary | ICD-10-CM

## 2011-04-20 DIAGNOSIS — I10 Essential (primary) hypertension: Secondary | ICD-10-CM

## 2011-04-20 LAB — BASIC METABOLIC PANEL
BUN: 43 mg/dL — ABNORMAL HIGH (ref 6–23)
Chloride: 108 mEq/L (ref 96–112)
Creatinine, Ser: 2.3 mg/dL — ABNORMAL HIGH (ref 0.4–1.5)
GFR: 29.75 mL/min — ABNORMAL LOW (ref >60.00)
Glucose, Bld: 238 mg/dL — ABNORMAL HIGH (ref 70–99)
Potassium: 5.7 mEq/L — ABNORMAL HIGH (ref 3.5–5.1)

## 2011-04-20 LAB — CBC WITH DIFFERENTIAL/PLATELET
Basophils Relative: 0.7 % (ref 0.0–3.0)
Eosinophils Relative: 2.8 % (ref 0.0–5.0)
Hemoglobin: 12.1 g/dL — ABNORMAL LOW (ref 13.0–17.0)
MCV: 98.6 fl (ref 78.0–100.0)
Monocytes Absolute: 0.8 10*3/uL (ref 0.1–1.0)
Neutro Abs: 8.4 10*3/uL — ABNORMAL HIGH (ref 1.4–7.7)
Neutrophils Relative %: 73.6 % (ref 43.0–77.0)
RBC: 3.65 Mil/uL — ABNORMAL LOW (ref 4.22–5.81)
WBC: 11.4 10*3/uL — ABNORMAL HIGH (ref 4.5–10.5)

## 2011-04-20 NOTE — Patient Instructions (Signed)
Continue on current regimen.  We are checking labs and I will call with results.  follow up Dr. Kriste Basque  In 3 months and As needed

## 2011-04-23 ENCOUNTER — Other Ambulatory Visit: Payer: Self-pay | Admitting: Pulmonary Disease

## 2011-04-23 NOTE — Assessment & Plan Note (Signed)
Compensated  Check bmet today  No changes in rx

## 2011-04-23 NOTE — Assessment & Plan Note (Signed)
Compensated on present regimen.   

## 2011-04-23 NOTE — Progress Notes (Signed)
Subjective:    Patient ID: Peter Nicholson, male    DOB: May 20, 1940, 71 y.o.   MRN: [id number]  HPI  71 y/o WM  ~  May 21, 2008:  he is a severe vasculopath w/ vertebral art angioplasty by IR x2, and known basilar art occlusion... he remains on COUMADIN w/ protimes done by DrReynolds, Neuro...  also had left CAE in 2000 by [facility]... he denies any acute problems & feels that he has been doing well... weight = 216#, no change over the last 6months... since his brief hosp in [place] for the fecal impaction- he states "I have done some research & found a lady who does enema therapy"...  ~  April 02, 2009:  since he was here last he's had f/u [facility] 3/10 w/ CDopplers that were unchanged & no further VVS follow up deemed necessary per [facility]... he's also had 2 Q96mo MRI's of Head & Neck by DrDeveshwar w/ bilat carotid siphon region atherosclerosis & narrowing (unchanged), and markedly diseased distal left vertebral & prox basilar art narrowing & irregularity (no change)... they have recommended stopping the Coumadin & he is now on ASA & Plavix.  ~  January 15, 2010:  he's had a good 10 mo he says> active in yard, heats w/ wood & does his own wood splitting... unfortunately he hasn't lost any weight, & not realy on diet... he stopped his Crestor "after talking to many people" and he stopped his Actos as well> f/u fasting labs shows LDL up to 149, and A1c up to 8.4.Marland KitchenMarland Kitchen we discussed options and he will decide what he wants to do about these serious medical problems (he decided to restart the Crestor but refuses the Actos)... due for f/u eval by DrDeveshwar/ IR to check the status of his posterior circ problems...  ~  September 30, 2010:  23mo ROV & he presents for f/u mult medical problems> He stiil cuts his own wood & mows 3 yards for exercise; not on much of a diet by his admission "I just don't eat as much, I feel great & cut back on sweets".    Known severe cerebrovasc dis w/ prev left CAE 2000 by  [facility] & two vertebral art angioplasties by DrDeveshwar for IR in 2001 & 2002; he has been stable w/o cerebral ischemic symptoms on ASA & Plavix since then...     Hx hypercholesterolemia & he had stopped his Crestor on his own last fall> f/u labs showed LDL 149 & he decided to restart the Crestor 20mg /d & has been stable on this- due for f/u FLP now= much improved.    Hx DM on Metformin & Glyburide which was increased at his last OV; he had stopped Actos & refused to restart; states BS at home all ~130-140 range & he is due for A1c check= 11.2 (?what happened?); offered max oral meds vs Levemir start...    He notes loose stools (on Miralax & Colase due to hx severe constip in past); discussed Metformin & rec that he stop the Miralax & Colase for now...    He notes LBP & has been seeing his chiropractor for adjustments for many months now; known degen spondylosis from Neurosurg eval DrRoy in 2007 & he requests referral for further eval...  04/20/11 Post Hospital  Admitted 11/26-12/14 after fall from ladder cleaning his gutters that resulted in complex right tibial plateau fracture that required surgery.  Hospital stay was complicated by Ileus seen by GI that was tx w/  success. Marland Kitchen He also suffered a TIA and was seen by neuro He did  not have apparent sequela from his TIA  attern.  Also had profound hypokalemia and hypomagnesemia that required replacement.  He was discharged to rehab.  Returns today feeling better. Still weak. Undergoing rehab .  No chest pain or dyspnea.   Problem List:  OBSTRUCTIVE SLEEP APNEA (ICD-327.23) - s/p split night sleep study 11/08- showing an AHI of 19, assoc w/ an O2 sat down to 78%... during the CPAP trial he titrated up to a pressure of 11cm but never ret to REM sleep so optimal pressure is still ???...he uses [facility] for his CPAP needs... ~  12/10: he tells me that he has stopped the CPAP, that he sleeps better without it, not snoring/ rests well, &  wakes feeling refreshed... he refuses Sleep Med re-eval etc...  Hx of HYPERTENSION - not currently on medications... BP today= 136/80 and denies HA, fatigue, visual changes, CP, palipit, dizziness, syncope, dyspnea, edema, etc... he states that his home BP checks are "OK".  CEREBROVASCULAR DISEASE (ICD-437.9) - stable on ASA 325mg /d & PLAVIX 75mg /d per DrDeveshwar/ DrLawson/ DrReynolds... he has severe extracranial cerebrovasc disease- esp in the post circulation, and he is s/p vertebral art angioplasty x 2, and w/ a known basilar art occlusion- prev on Coumadin controlled by Neurology (switched to ASA/ Plavix in 2010)... also followed by Windell Moulding and DrDeveshwar for IR... he had a left CAE in 6/00 by [facility]... ~  CDoppler 2/07 showed 0-39% bilat ICA stenoses, s/p left CAE w/ DPA... ~  eval 3/10 by Windell Moulding w/ CDopplers that showed similar patency bilat & 0-39% bilat ICA stenoses; due to the 10 yr interval DrLawson did not feel he needed any additional follow up. ~  continued f/u DrDevershwar IR w/ MRI/ MRAs- last 12/10 showing bilat carotid siphon region atherosclerosis & narrowing (unchanged), and markedly diseased distal left vertebral & prox basilar art narrowing (no change)...   VENOUS INSUFFICIENCY (ICD-459.81) - he knows to avoid sodium, elevate legs, wear support hose...  HYPERCHOLESTEROLEMIA (ICD-272.0) - back on CRESTOR 20mg /d & tol well (he had stopped it on his own 2011 & decided to restart 10/11). ~  FLP 6/08 showed TChol 153, TG 120, HDL 36, LDL 93 ~  FLP 8/09 showed TChol 131, TG 84, HDL 38, LDL 76 ~  2010:  pt was not fasting for blood work... rec to continue Crestor10 + diet efforts. ~  FLP 10/11 showed TChol 210, TG 161, HDL 40, LDL 149... rec to start Crestor 20mg /d, he will decide. ~  FLP 6/12 on Cres20 showed TChol 106, TG 95, HDL 41, LDL 46... Continue same.  DIABETES MELLITUS (ICD-250.00) - currently on METFORMIN 500mg - 2Bid + GLYBURIDE 5mg - 2Bid (he stopped Actos on his  own in 2011). ~  labs 6/08 showed BS= 155, HgA1c= 8.1.Marland Kitchen. (glyburide incr to 5Bid). ~  labs 11/08 showed BS= 352, HgA1c= 8.1.... (Actos added). ~  labs 8/09 showed BS= 128, HgA1c= 7.1.Marland KitchenMarland Kitchen rec- same meds, better diet, get wt down! ~  labs 2/10 showed BS= 249, A1c= 7.3.... Metform incr to 2Bid. ~  labs 12/10 (nonfasting- on all 3 meds) showed BS= 87, A1c= 6.9 ~  labs 10/11 on Metform1000Bid+Gybur5Bid showed BS= 180, A1c= 8.4.Marland KitchenMarland Kitchen rec to incr GLYBUR10Bid + diet. ~  labd 6/12 on Metform1000Bid+Glybur10Bid showed BS= 196, A1c= 11.2 (?what happened?) ==> rec refer to Endocrine.  OVERWEIGHT (ICD-278.02) - he states that he is not on much of a  diet & we reviewed low carb/ no sweets/ low fat diet. ~  weight 8/09 = 216# ~  weight 2/10 = 216# ~  weight 12/10 = 212# ~  weight 10/11 = 210# ~  Weight 6/12 = 206# and he admits to not being on much of a diet...  GERD (ICD-530.81) - prev on Nexium40, he switched to OTC PRILOSEC 20mg /d... ~  EGD 19/03 showed 4cmHH, GERD & esoph stricture dilated... ~  EGD 12/09 by DrPatterson showed HH & severe erosive esophagitis from GERD...  DIVERTICULOSIS OF COLON (ICD-562.10) - note: he was hosp in [place] briefly 12/09 for fecal impaction; prev on Miralax & Colase but recently noted loose stools on the Metformin 1000mg Bid... ~  colonoscopy 10/03 by DrPatterson showed divertics only... ~  colonoscopy 12/09 by DrPatterson was also normal x for divertics...  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 Hx of RENAL CELL CANCER (ICD-189.0) - he had a partial nephrectomy of the right side of his horseshoe kidney in 1994 due to renal cell ca... still followed by DrPeterson yearly by his hx but we don't have any recent notes... ~  labs 10/11 showed PSA= 0.95  Hx of NEPHROLITHIASIS (ICD-592.0) - remote hx of kidney stones followed by DrPeterson...  DEGENERATIVE JOINT DISEASE (ICD-715.90) - and elbow tendonitis Rx'd OTC Prn...  GOUT  (ICD-274.9) - on ALLOPURINOL 300mg /d... ~  labs 10/11 showed URIC= 4.7  BACK PAIN, LUMBAR (ICD-724.2) - known degenerative spondylitic disease at L4-5 and L5-S1 followed by [facility] for Neurosurg... back pain is doing reasonably well and last checked by [facility] in 2007. ~  6/12: he reports further back pain w/ eval by Chiropractor showing L4 disc prob & regular adjustments; but now notes incr pain & he requests referral to Neurosurg for further eval...  ANXIETY (ICD-300.00) - uses ALPRAZOLAM 0.5mg  as needed.   Past Surgical History  Procedure Date  . Incisional hernia repair 1/96    Dr.Leone  . Nasal sinus surgery 12/96    Dr.Redman  . Cholecystectomy 9/04    Dr. Maryagnes Amos  . Partial nephrectomy 1994    renal cell cancer in horseshoe kidney  . Carotid endarterectomy 09/2000    Dr.Lawson  . Veterbral art angioplasty     x2  . Small intestine surgery   . External fixation leg 03/10/2011    Procedure: EXTERNAL FIXATION LEG;  Surgeon: Budd Palmer;  Location: [facility] OR;  Service: Orthopedics;  Laterality: Right;  . Orif tibia plateau 03/24/2011    Procedure: OPEN REDUCTION INTERNAL FIXATION (ORIF) TIBIAL PLATEAU;  Surgeon: Budd Palmer;  Location: [facility] OR;  Service: Orthopedics;  Laterality: Right;    Outpatient Encounter Prescriptions as of 09/30/2010  Medication Sig Dispense Refill  . allopurinol (ZYLOPRIM) 300 MG tablet Take 300 mg by mouth daily.        Marland Kitchen ALPRAZolam (XANAX) 0.5 MG tablet Take 1/2 to 1 tablet by mouth three times daily as needed for nerves  90 tablet  0  . aspirin 325 MG EC tablet Take 325 mg by mouth daily.        . clopidogrel (PLAVIX) 75 MG tablet Take 75 mg by mouth daily.        Marland Kitchen docusate sodium (COLACE) 100 MG capsule Take 100 mg by mouth daily.        Marland Kitchen glyBURIDE (DIABETA) 5 MG tablet Take 5 mg by mouth 2 (two) times daily with a meal.        .  metFORMIN (GLUCOPHAGE) 500 MG tablet Take 500 mg by mouth 2 (two) times daily with a meal.        . Multiple Vitamin  (MULTIVITAMIN) tablet Take 1 tablet by mouth daily.        Marland Kitchen omeprazole (PRILOSEC OTC) 20 MG tablet Take 20 mg by mouth daily.        . polyethylene glycol powder (MIRALAX) powder Take 17 g by mouth 2 (two) times daily. Take one scoop in 8 ounces of water two times a day as needed.       . rosuvastatin (CRESTOR) 20 MG tablet Take 1 tablet (20 mg total) by mouth at bedtime.  30 tablet  0    Allergies  Allergen Reactions  . Hydromorphone Hcl     REACTION: hallucinations  . Promethazine Hcl     REACTION: hallucinations    Review of Systems Constitutional:   No  weight loss, night sweats,  Fevers, chills, + fatigue, or  lassitude.  HEENT:   No headaches,  Difficulty swallowing,  Tooth/dental problems, or  Sore throat,                No sneezing, itching, ear ache, nasal congestion, post nasal drip,   CV:  No chest pain,  Orthopnea, PND, swelling in lower extremities, anasarca, dizziness, palpitations, syncope.   GI  No heartburn, indigestion, abdominal pain, nausea, vomiting, diarrhea, change in bowel habits, loss of appetite, bloody stools.   Resp: No shortness of breath with exertion or at rest.  No excess mucus, no productive cough,  No non-productive cough,  No coughing up of blood.  No change in color of mucus.  No wheezing.  No chest wall deformity  Skin: no rash or lesions.  GU: no dysuria, change in color of urine, no urgency or frequency.  No flank pain, no hematuria   MS:  No back pain.  Psych:  No change in mood or affect. No depression or anxiety.  No memory loss.               Objective:   Physical Exam      WD, WN, 71  y/o WM in NAD...  GENERAL:  Alert & oriented; pleasant & cooperative... HEENT:  [place]/AT, EOM-wnl, PERRLA, EACs-clear, TMs-wnl, NOSE-clear, THROAT-clear & wnl. NECK:  Supple w/ fairROM; no JVD; s/p left CAE, +bruits; no thyromegaly or nodules palpated; no lymphadenopathy. CHEST:  Clear to P & A; without wheezes/ rales/ or rhonchi  heard... HEART:  Regular Rhythm; gr 1/6 SEM without rubs or gallops detected... ABDOMEN:  Soft & nontender; normal bowel sounds; no organomegaly or masses palpated... EXT: without deformities, mild arthritic changes; no varicose veins/ +venous insuffic/ 1+ edema. Right leg brace NEURO:   no focal neuro deficits... DERM:  No lesions noted; no rash etc...   Assessment & Plan:

## 2011-04-23 NOTE — Assessment & Plan Note (Signed)
Labs today

## 2011-04-27 ENCOUNTER — Ambulatory Visit (INDEPENDENT_AMBULATORY_CARE_PROVIDER_SITE_OTHER): Payer: Medicare Other | Admitting: Adult Health

## 2011-04-27 ENCOUNTER — Telehealth: Payer: Self-pay | Admitting: Pulmonary Disease

## 2011-04-27 ENCOUNTER — Other Ambulatory Visit (INDEPENDENT_AMBULATORY_CARE_PROVIDER_SITE_OTHER): Payer: Medicare Other

## 2011-04-27 ENCOUNTER — Encounter: Payer: Self-pay | Admitting: Adult Health

## 2011-04-27 VITALS — BP 110/62 | Temp 97.0°F

## 2011-04-27 DIAGNOSIS — N390 Urinary tract infection, site not specified: Secondary | ICD-10-CM

## 2011-04-27 DIAGNOSIS — R300 Dysuria: Secondary | ICD-10-CM

## 2011-04-27 DIAGNOSIS — I10 Essential (primary) hypertension: Secondary | ICD-10-CM

## 2011-04-27 DIAGNOSIS — N289 Disorder of kidney and ureter, unspecified: Secondary | ICD-10-CM

## 2011-04-27 LAB — URINALYSIS, ROUTINE W REFLEX MICROSCOPIC
Nitrite: NEGATIVE
Specific Gravity, Urine: 1.025 (ref 1.000–1.030)
Urine Glucose: NEGATIVE
Urobilinogen, UA: 0.2 (ref 0.0–1.0)

## 2011-04-27 MED ORDER — CIPROFLOXACIN HCL 500 MG PO TABS: 500.0000 mg | ORAL_TABLET | Freq: Two times a day (BID) | ORAL | Status: DC

## 2011-04-27 MED ORDER — ONDANSETRON HCL 4 MG PO TABS: 4.0000 mg | ORAL_TABLET | Freq: Three times a day (TID) | ORAL | Status: DC | PRN

## 2011-04-27 NOTE — Patient Instructions (Addendum)
Begin Cipro 500mg  Twice daily  For 10 days -take with food.  Fluids  I will call with labs.  Zofran 4mg  every 6 hr as needed for nausea.  Follow up with Dr. Kriste Basque  In 2 days as planned  Please contact office for sooner follow up if symptoms do not improve or worsen or seek emergency care  Advance diet as tolerated .

## 2011-04-27 NOTE — Telephone Encounter (Signed)
I spoke with spouse and advised her we did not have any sooner openings than 4:30 today. She was fine w/ this and was just glad to get pt in today. Nothing further was needed

## 2011-04-28 ENCOUNTER — Encounter: Payer: Self-pay | Admitting: Adult Health

## 2011-04-28 DIAGNOSIS — N390 Urinary tract infection, site not specified: Secondary | ICD-10-CM | POA: Insufficient documentation

## 2011-04-28 DIAGNOSIS — N289 Disorder of kidney and ureter, unspecified: Secondary | ICD-10-CM | POA: Insufficient documentation

## 2011-04-28 LAB — BASIC METABOLIC PANEL
BUN: 31 mg/dL — ABNORMAL HIGH (ref 6–23)
CO2: 25 mEq/L (ref 19–32)
Chloride: 96 mEq/L (ref 96–112)
Creatinine, Ser: 1.2 mg/dL (ref 0.4–1.5)
Potassium: 5.4 mEq/L — ABNORMAL HIGH (ref 3.5–5.1)

## 2011-04-28 NOTE — Assessment & Plan Note (Signed)
Check bmet today  Remain off ace , b/p is doing okay off.  Remain off K+ as well

## 2011-04-28 NOTE — Assessment & Plan Note (Signed)
UA + for UTI  Will follow cx results.   Plan:  Begin Cipro 500mg  Twice daily  For 10 days -take with food.  Fluids  Zofran 4mg  every 6 hr as needed for nausea.  Follow up with Dr. Kriste Basque  In 2 days as planned  Please contact office for sooner follow up if symptoms do not improve or worsen or seek emergency care  Advance diet as tolerated .

## 2011-04-28 NOTE — Progress Notes (Signed)
Subjective:    Patient ID: Peter Nicholson, male    DOB: Jul 17, 1940, 71 y.o.   MRN: [id number]  HPI  71 y/o WM  ~  May 21, 2008:  he is a severe vasculopath w/ vertebral art angioplasty by IR x2, and known basilar art occlusion... he remains on COUMADIN w/ protimes done by DrReynolds, Neuro...  also had left CAE in 2000 by [facility]... he denies any acute problems & feels that he has been doing well... weight = 216#, no change over the last 6months... since his brief hosp in [place] for the fecal impaction- he states "I have done some research & found a lady who does enema therapy"...  ~  April 02, 2009:  since he was here last he's had f/u [facility] 3/10 w/ CDopplers that were unchanged & no further VVS follow up deemed necessary per [facility]... he's also had 2 Q66mo MRI's of Head & Neck by DrDeveshwar w/ bilat carotid siphon region atherosclerosis & narrowing (unchanged), and markedly diseased distal left vertebral & prox basilar art narrowing & irregularity (no change)... they have recommended stopping the Coumadin & he is now on ASA & Plavix.  ~  January 15, 2010:  he's had a good 10 mo he says> active in yard, heats w/ wood & does his own wood splitting... unfortunately he hasn't lost any weight, & not realy on diet... he stopped his Crestor "after talking to many people" and he stopped his Actos as well> f/u fasting labs shows LDL up to 149, and A1c up to 8.4.Marland KitchenMarland Kitchen we discussed options and he will decide what he wants to do about these serious medical problems (he decided to restart the Crestor but refuses the Actos)... due for f/u eval by DrDeveshwar/ IR to check the status of his posterior circ problems...  ~  September 30, 2010:  42mo ROV & he presents for f/u mult medical problems> He stiil cuts his own wood & mows 3 yards for exercise; not on much of a diet by his admission "I just don't eat as much, I feel great & cut back on sweets".    Known severe cerebrovasc dis w/ prev left CAE 2000 by  [facility] & two vertebral art angioplasties by DrDeveshwar for IR in 2001 & 2002; he has been stable w/o cerebral ischemic symptoms on ASA & Plavix since then...     Hx hypercholesterolemia & he had stopped his Crestor on his own last fall> f/u labs showed LDL 149 & he decided to restart the Crestor 20mg /d & has been stable on this- due for f/u FLP now= much improved.    Hx DM on Metformin & Glyburide which was increased at his last OV; he had stopped Actos & refused to restart; states BS at home all ~130-140 range & he is due for A1c check= 11.2 (?what happened?); offered max oral meds vs Levemir start...    He notes loose stools (on Miralax & Colase due to hx severe constip in past); discussed Metformin & rec that he stop the Miralax & Colase for now...    He notes LBP & has been seeing his chiropractor for adjustments for many months now; known degen spondylosis from Neurosurg eval DrRoy in 2007 & he requests referral for further eval...  04/20/11 Post Hospital  Admitted 11/26-12/14 after fall from ladder cleaning his gutters that resulted in complex right tibial plateau fracture that required surgery.  Hospital stay was complicated by Ileus seen by GI that was tx w/  success. Marland Kitchen He also suffered a TIA and was seen by neuro He did  not have apparent sequela from his TIA  attern.  Also had profound hypokalemia and hypomagnesemia that required replacement.  He was discharged to rehab.  Returns today feeling better. Still weak. Undergoing rehab .  No chest pain or dyspnea.   04/27/11 Acute OV  Pt presents for an acute office visit. Complains of burning with urination/cloudy urine with odor, n/v, loss of appetite x2-3days. Was discharged from NH last week and 3 days ago started with dysuria.  Appetite is down. Sick on stomach off and on. Vomitted x 1 . No fever or back pain.  Had foley recently during his surgery /recovery.  No hx of UTI . Does have a hx of (1994) renal  mass= renal cell ca...s/p partial  nephrectomy. Last ov with renal insufficiency with bump in scr to ~2.2 - he was on lasix , ace and Kdur. K+ was elevated last week. He was instructed to stop K+ and ACE. He is off lasix at this time as well.  He has been sent for labs today Today UA shows suspected UTI w/ large leuks and bacteria.  Urine cx is pending.     Problem List:  OBSTRUCTIVE SLEEP APNEA (ICD-327.23) - s/p split night sleep study 11/08- showing an AHI of 19, assoc w/ an O2 sat down to 78%... during the CPAP trial he titrated up to a pressure of 11cm but never ret to REM sleep so optimal pressure is still ???...he uses [facility] for his CPAP needs... ~  12/10: he tells me that he has stopped the CPAP, that he sleeps better without it, not snoring/ rests well, & wakes feeling refreshed... he refuses Sleep Med re-eval etc...  Hx of HYPERTENSION - not currently on medications... BP today= 136/80 and denies HA, fatigue, visual changes, CP, palipit, dizziness, syncope, dyspnea, edema, etc... he states that his home BP checks are "OK".  CEREBROVASCULAR DISEASE (ICD-437.9) - stable on ASA 325mg /d & PLAVIX 75mg /d per DrDeveshwar/ DrLawson/ DrReynolds... he has severe extracranial cerebrovasc disease- esp in the post circulation, and he is s/p vertebral art angioplasty x 2, and w/ a known basilar art occlusion- prev on Coumadin controlled by Neurology (switched to ASA/ Plavix in 2010)... also followed by Windell Moulding and DrDeveshwar for IR... he had a left CAE in 6/00 by [facility]... ~  CDoppler 2/07 showed 0-39% bilat ICA stenoses, s/p left CAE w/ DPA... ~  eval 3/10 by Windell Moulding w/ CDopplers that showed similar patency bilat & 0-39% bilat ICA stenoses; due to the 10 yr interval DrLawson did not feel he needed any additional follow up. ~  continued f/u DrDevershwar IR w/ MRI/ MRAs- last 12/10 showing bilat carotid siphon region atherosclerosis & narrowing (unchanged), and markedly diseased distal left vertebral & prox  basilar art narrowing (no change)...   VENOUS INSUFFICIENCY (ICD-459.81) - he knows to avoid sodium, elevate legs, wear support hose...  HYPERCHOLESTEROLEMIA (ICD-272.0) - back on CRESTOR 20mg /d & tol well (he had stopped it on his own 2011 & decided to restart 10/11). ~  FLP 6/08 showed TChol 153, TG 120, HDL 36, LDL 93 ~  FLP 8/09 showed TChol 131, TG 84, HDL 38, LDL 76 ~  2010:  pt was not fasting for blood work... rec to continue Crestor10 + diet efforts. ~  FLP 10/11 showed TChol 210, TG 161, HDL 40, LDL 149... rec to start Crestor 20mg /d, he will decide. ~  FLP  6/12 on Cres20 showed TChol 106, TG 95, HDL 41, LDL 46... Continue same.  DIABETES MELLITUS (ICD-250.00) - currently on METFORMIN 500mg - 2Bid + GLYBURIDE 5mg - 2Bid (he stopped Actos on his own in 2011). ~  labs 6/08 showed BS= 155, HgA1c= 8.1.Marland Kitchen. (glyburide incr to 5Bid). ~  labs 11/08 showed BS= 352, HgA1c= 8.1.... (Actos added). ~  labs 8/09 showed BS= 128, HgA1c= 7.1.Marland KitchenMarland Kitchen rec- same meds, better diet, get wt down! ~  labs 2/10 showed BS= 249, A1c= 7.3.... Metform incr to 2Bid. ~  labs 12/10 (nonfasting- on all 3 meds) showed BS= 87, A1c= 6.9 ~  labs 10/11 on Metform1000Bid+Gybur5Bid showed BS= 180, A1c= 8.4.Marland KitchenMarland Kitchen rec to incr GLYBUR10Bid + diet. ~  labd 6/12 on Metform1000Bid+Glybur10Bid showed BS= 196, A1c= 11.2 (?what happened?) ==> rec refer to Endocrine.  OVERWEIGHT (ICD-278.02) - he states that he is not on much of a diet & we reviewed low carb/ no sweets/ low fat diet. ~  weight 8/09 = 216# ~  weight 2/10 = 216# ~  weight 12/10 = 212# ~  weight 10/11 = 210# ~  Weight 6/12 = 206# and he admits to not being on much of a diet...  GERD (ICD-530.81) - prev on Nexium40, he switched to OTC PRILOSEC 20mg /d... ~  EGD 19/03 showed 4cmHH, GERD & esoph stricture dilated... ~  EGD 12/09 by DrPatterson showed HH & severe erosive esophagitis from GERD...  DIVERTICULOSIS OF COLON (ICD-562.10) - note: he was hosp in [place] briefly 12/09  for fecal impaction; prev on Miralax & Colase but recently noted loose stools on the Metformin 1000mg Bid... ~  colonoscopy 10/03 by DrPatterson showed divertics only... ~  colonoscopy 12/09 by DrPatterson was also normal x for divertics...  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 Hx of RENAL CELL CANCER (ICD-189.0) - he had a partial nephrectomy of the right side of his horseshoe kidney in 1994 due to renal cell ca... still followed by DrPeterson yearly by his hx but we don't have any recent notes... ~  labs 10/11 showed PSA= 0.95  Hx of NEPHROLITHIASIS (ICD-592.0) - remote hx of kidney stones followed by DrPeterson...  DEGENERATIVE JOINT DISEASE (ICD-715.90) - and elbow tendonitis Rx'd OTC Prn...  GOUT (ICD-274.9) - on ALLOPURINOL 300mg /d... ~  labs 10/11 showed URIC= 4.7  BACK PAIN, LUMBAR (ICD-724.2) - known degenerative spondylitic disease at L4-5 and L5-S1 followed by [facility]... back pain is doing reasonably well and last checked by [facility] in 2007. ~  6/12: he reports further back pain w/ eval by Chiropractor showing L4 disc prob & regular adjustments; but now notes incr pain & he requests referral to Neurosurg for further eval...  ANXIETY (ICD-300.00) - uses ALPRAZOLAM 0.5mg  as needed.   Past Surgical History  Procedure Date  . Incisional hernia repair 1/96    Dr.Leone  . Nasal sinus surgery 12/96    Dr.Redman  . Cholecystectomy 9/04    Dr. Maryagnes Amos  . Partial nephrectomy 1994    renal cell cancer in horseshoe kidney  . Carotid endarterectomy 09/2000    Dr.Lawson  . Veterbral art angioplasty     x2  . Small intestine surgery   . External fixation leg 03/10/2011    Procedure: EXTERNAL FIXATION LEG;  Surgeon: Budd Palmer;  Location: [facility] OR;  Service: Orthopedics;  Laterality: Right;  . Orif tibia plateau 03/24/2011    Procedure: OPEN REDUCTION INTERNAL FIXATION (ORIF) TIBIAL PLATEAU;  Surgeon: Doralee Albino  Handy;   Location: [facility] OR;  Service: Orthopedics;  Laterality: Right;    Outpatient Encounter Prescriptions as of 09/30/2010  Medication Sig Dispense Refill  . allopurinol (ZYLOPRIM) 300 MG tablet Take 300 mg by mouth daily.        Marland Kitchen ALPRAZolam (XANAX) 0.5 MG tablet Take 1/2 to 1 tablet by mouth three times daily as needed for nerves  90 tablet  0  . aspirin 325 MG EC tablet Take 325 mg by mouth daily.        . clopidogrel (PLAVIX) 75 MG tablet Take 75 mg by mouth daily.        Marland Kitchen docusate sodium (COLACE) 100 MG capsule Take 100 mg by mouth daily.        Marland Kitchen glyBURIDE (DIABETA) 5 MG tablet Take 5 mg by mouth 2 (two) times daily with a meal.        . metFORMIN (GLUCOPHAGE) 500 MG tablet Take 500 mg by mouth 2 (two) times daily with a meal.        . Multiple Vitamin (MULTIVITAMIN) tablet Take 1 tablet by mouth daily.        Marland Kitchen omeprazole (PRILOSEC OTC) 20 MG tablet Take 20 mg by mouth daily.        . polyethylene glycol powder (MIRALAX) powder Take 17 g by mouth 2 (two) times daily. Take one scoop in 8 ounces of water two times a day as needed.       . rosuvastatin (CRESTOR) 20 MG tablet Take 1 tablet (20 mg total) by mouth at bedtime.  30 tablet  0    Allergies  Allergen Reactions  . Hydromorphone Hcl     REACTION: hallucinations  . Promethazine Hcl     REACTION: hallucinations    Review of Systems Constitutional:   No  weight loss, night sweats,  Fevers, chills, + fatigue, or  lassitude.  HEENT:   No headaches,  Difficulty swallowing,  Tooth/dental problems, or  Sore throat,                No sneezing, itching, ear ache, nasal congestion, post nasal drip,   CV:  No chest pain,  Orthopnea, PND, swelling in lower extremities, anasarca, dizziness, palpitations, syncope.   GI  No heartburn, indigestion, abdominal pain,  +nausea, vomiting,  No diarrhea, change in bowel habits,  +loss of appetite  Resp: No shortness of breath with exertion or at rest.  No excess mucus, no productive cough,  No  non-productive cough,  No coughing up of blood.  No change in color of mucus.  No wheezing.  No chest wall deformity  Skin: no rash or lesions.  GU: +dysuria, change in color of urine, no urgency or frequency.  No flank pain, no hematuria   MS:  No back pain.  Psych:  No change in mood or affect. No depression or anxiety.  No memory loss.               Objective:   Physical Exam      WD, WN, 71  y/o WM in NAD... In wheelchair wl right leg brace  GENERAL:  Alert & oriented; pleasant & cooperative... HEENT:  [place]/AT, EOM-wnl, PERRLA, EACs-clear, TMs-wnl, NOSE-clear, THROAT-clear & wnl. NECK:  Supple w/ fairROM; no JVD; s/p left CAE, +bruits; no thyromegaly or nodules palpated; no lymphadenopathy. CHEST:  Clear to P & A; without wheezes/ rales/ or rhonchi heard... HEART:  Regular Rhythm; gr 1/6 SEM without rubs or gallops detected... ABDOMEN:  Soft & nontender; normal bowel sounds; no organomegaly or masses palpated... Neg CVA tenderness  EXT: without deformities, mild arthritic changes; no varicose veins/ +venous insuffic/ 1+ edema. Right leg brace NEURO:   no focal neuro deficits... DERM:  No lesions noted; no rash etc...   Assessment & Plan:

## 2011-04-28 NOTE — Assessment & Plan Note (Signed)
Controlled off ACE  Check bmet today

## 2011-04-29 ENCOUNTER — Ambulatory Visit: Payer: Medicare Other | Admitting: Pulmonary Disease

## 2011-04-29 ENCOUNTER — Telehealth: Payer: Self-pay | Admitting: Pulmonary Disease

## 2011-04-29 ENCOUNTER — Telehealth: Payer: Self-pay | Admitting: Adult Health

## 2011-04-29 NOTE — Telephone Encounter (Signed)
Spoke with Judith-states someone went over patients lab work with her today and she had cancelled appt with SN today due to patient feeling better; now needs to know if pt should come in asap with SN due to abnormal labs or keep 06-2011 appt with SN.  Notes Recorded by Rubye Oaks, NP on 04/28/2011 at 10:29 AM Kidney fxn is much better but K+ is still up.  Needs to keep follow up -tomorrow-bring all his meds  Make sure he is not taking Lisinopril, KDur  Please contact office for sooner follow up if symptoms do not improve or worsen or seek emergency care.  Please advise.

## 2011-04-29 NOTE — Telephone Encounter (Signed)
Per SN---he is very pleased that the pt is feeling better.  He will need appt with SN in 2 months for follow up. Bring all meds at this time. thanks

## 2011-04-29 NOTE — Telephone Encounter (Signed)
Spoke with pt's spouse and have notified her of lab results per TP. She verbalized understanding. States that the pt has stopped taking his lisinopril and Kdur. States that since the pt was feeling better she went ahead and cancelled his appt. According to the result note however, TP rec that the pt keep today's appt, which has now been taken by another pt. Spouse now wanting to know when SN can see him again. Please advise, thanks!

## 2011-04-29 NOTE — Telephone Encounter (Signed)
LMTCB

## 2011-04-29 NOTE — Telephone Encounter (Signed)
Spoke with wife Bosie Clos); appt scheduled with SN on 06-29-2011 at 11am and aware to bring all medications with them to the appt.

## 2011-04-30 NOTE — Telephone Encounter (Signed)
I spoke with pt and is aware of SN response and voiced his understanding

## 2011-04-30 NOTE — Telephone Encounter (Signed)
Pt will need  To come in and see SN at next aval.  thanks

## 2011-05-01 LAB — URINE CULTURE: Colony Count: >=100000

## 2011-05-06 ENCOUNTER — Other Ambulatory Visit: Payer: Self-pay | Admitting: Adult Health

## 2011-05-06 MED ORDER — LEVOFLOXACIN 500 MG PO TABS: 500.0000 mg | ORAL_TABLET | Freq: Every day | ORAL | Status: AC

## 2011-05-13 ENCOUNTER — Encounter: Payer: Self-pay | Admitting: Pulmonary Disease

## 2011-05-13 ENCOUNTER — Telehealth: Payer: Self-pay | Admitting: Pulmonary Disease

## 2011-05-13 NOTE — Telephone Encounter (Signed)
SN is aware. 

## 2011-05-13 NOTE — Telephone Encounter (Signed)
Spoke with Peter Nicholson that patient has been D/C from [facility] on 05-04-2011; no more services needed. PT and OT D/C patient on 04-24-2011.

## 2011-05-15 ENCOUNTER — Encounter: Payer: Self-pay | Admitting: Pulmonary Disease

## 2011-05-15 ENCOUNTER — Ambulatory Visit (INDEPENDENT_AMBULATORY_CARE_PROVIDER_SITE_OTHER): Payer: Medicare Other | Admitting: Pulmonary Disease

## 2011-05-15 ENCOUNTER — Other Ambulatory Visit: Payer: Self-pay | Admitting: Pulmonary Disease

## 2011-05-15 DIAGNOSIS — E119 Type 2 diabetes mellitus without complications: Secondary | ICD-10-CM

## 2011-05-15 DIAGNOSIS — I10 Essential (primary) hypertension: Secondary | ICD-10-CM

## 2011-05-15 DIAGNOSIS — K5730 Diverticulosis of large intestine without perforation or abscess without bleeding: Secondary | ICD-10-CM

## 2011-05-15 DIAGNOSIS — M545 Low back pain: Secondary | ICD-10-CM

## 2011-05-15 DIAGNOSIS — E7800 Pure hypercholesterolemia, unspecified: Secondary | ICD-10-CM

## 2011-05-15 DIAGNOSIS — G459 Transient cerebral ischemic attack, unspecified: Secondary | ICD-10-CM

## 2011-05-15 DIAGNOSIS — S82143A Displaced bicondylar fracture of unspecified tibia, initial encounter for closed fracture: Secondary | ICD-10-CM

## 2011-05-15 DIAGNOSIS — F411 Generalized anxiety disorder: Secondary | ICD-10-CM

## 2011-05-15 DIAGNOSIS — M1990 Unspecified osteoarthritis, unspecified site: Secondary | ICD-10-CM

## 2011-05-15 DIAGNOSIS — I872 Venous insufficiency (chronic) (peripheral): Secondary | ICD-10-CM

## 2011-05-15 DIAGNOSIS — D62 Acute posthemorrhagic anemia: Secondary | ICD-10-CM

## 2011-05-15 DIAGNOSIS — M109 Gout, unspecified: Secondary | ICD-10-CM

## 2011-05-15 DIAGNOSIS — C649 Malignant neoplasm of unspecified kidney, except renal pelvis: Secondary | ICD-10-CM

## 2011-05-15 DIAGNOSIS — I679 Cerebrovascular disease, unspecified: Secondary | ICD-10-CM

## 2011-05-15 MED ORDER — ALLOPURINOL 300 MG PO TABS: 300.0000 mg | ORAL_TABLET | Freq: Every day | ORAL | Status: DC

## 2011-05-15 MED ORDER — PANTOPRAZOLE SODIUM 40 MG PO TBEC: 40.0000 mg | DELAYED_RELEASE_TABLET | Freq: Every day | ORAL | Status: DC

## 2011-05-15 MED ORDER — METFORMIN HCL 500 MG PO TABS: ORAL_TABLET | ORAL | Status: DC

## 2011-05-15 MED ORDER — PIOGLITAZONE HCL 30 MG PO TABS: 30.0000 mg | ORAL_TABLET | Freq: Every day | ORAL | Status: DC

## 2011-05-15 MED ORDER — SERTRALINE HCL 50 MG PO TABS: 50.0000 mg | ORAL_TABLET | Freq: Every day | ORAL | Status: DC

## 2011-05-15 MED ORDER — CLOPIDOGREL BISULFATE 75 MG PO TABS: 75.0000 mg | ORAL_TABLET | Freq: Every day | ORAL | Status: DC

## 2011-05-15 MED ORDER — ROSUVASTATIN CALCIUM 10 MG PO TABS: 10.0000 mg | ORAL_TABLET | Freq: Every day | ORAL | Status: DC

## 2011-05-15 MED ORDER — GLYBURIDE 5 MG PO TABS: ORAL_TABLET | ORAL | Status: DC

## 2011-05-15 MED ORDER — SAXAGLIPTIN HCL 5 MG PO TABS: 5.0000 mg | ORAL_TABLET | Freq: Every day | ORAL | Status: DC

## 2011-05-15 NOTE — Patient Instructions (Signed)
Today we updated your med list in our EPIC system...    We spent some extra time today reconciling your prev meds w/ Hosp meds, w/ Rehab meds & transition home...    The list below should be accurate & up to date...    We are adding SERTRALINE 50mg  daily for anxiety & depression (temporary med to get you over the hump)...  Call for any questions...  Let's plan a recheck in 6-8 weeks w/ FASTING blood work at that time.Marland KitchenMarland Kitchen

## 2011-05-15 NOTE — Progress Notes (Signed)
Subjective:    Patient ID: Peter Nicholson, male    DOB: 12-Dec-1940, 71 y.o.   MRN: [id number]  HPI 71 y/o WM here for a follow up visit... he has multiple medical problems as noted below...   ~  February  8, 71 2010:  he is a severe vasculopath w/ vertebral art angioplasty by IR x2, and known basilar art occlusion... he remains on COUMADIN w/ protimes done by DrReynolds, Neuro...  also had left CAE in 2000 by [facility]... he denies any acute problems & feels that he has been doing well... weight = 216#, no change over the last 6months... since his brief hosp in [place] for the fecal impaction- he states "I have done some research & found a lady who does enema therapy"...  ~  April 02, 2009:  since he was here last he's had f/u [facility] 3/10 w/ CDopplers that were unchanged & no further VVS follow up deemed necessary per [facility]... he's also had 2 Q32mo MRI's of Head & Neck by DrDeveshwar w/ bilat carotid siphon region atherosclerosis & narrowing (unchanged), and markedly diseased distal left vertebral & prox basilar art narrowing & irregularity (no change)... they have recommended stopping the Coumadin & he is now on ASA & Plavix.  ~  October  71 2011:  he's had a good 10 mo he says> active in yard, heats w/ wood & does his own wood splitting... unfortunately he hasn't lost any weight, & not really on diet... he stopped his Crestor "after talking to many people" and he stopped his Actos as well> f/u fasting labs shows LDL up to 149, and A1c up to 8.4.Marland KitchenMarland Kitchen we discussed options and he will decide what he wants to do about these serious medical problems (he decided to restart the Crestor but refuses the Actos)... due for f/u eval by DrDeveshwar/ IR to check the status of his posterior circ problems...  ~  June 71 2012:  13mo ROV & he presents for f/u mult medical problems> He stiil cuts his own wood & mows 3 yards for exercise; not on much of a diet by his admission "I just don't eat as much, I feel great &  cut back on sweets".    Known severe cerebrovasc dis w/ prev left CAE 2000 by [facility] & two vertebral art angioplasties by DrDeveshwar for IR in 2001 & 2002; he has been stable w/o cerebral ischemic symptoms on ASA & Plavix since then...     Hx hypercholesterolemia & he had stopped his Crestor on his own last fall> f/u labs showed LDL 149 & he decided to restart the Crestor 20mg /d & has been stable on this- due for f/u FLP ==> TChol 106, TG 95, HDL 41, LDL 46    Hx DM on Metformin & Glyburide which was increased at his last OV; he had stopped Actos & refused to restart; states BS at home all ~130-140 range & he is due for A1c check= 11.2 (?what happened?); offered max oral meds vs Levemir start...    He notes loose stools (on Miralax & Colase due to hx severe constip in past); discussed Metformin & rec that he stop the Miralax & Colase for now...    He notes LBP & has been seeing his chiropractor for adjustments for many months now; known degen spondylosis from Neurosurg eval DrRoy in 2007 & he requests referral for further eval & shots...  ~  February 71 2013:  61mo ROV & post hosp check> He was [facility] 11/26 -  03/27/11 after fall from ladder cleaning gutters w/ complex right tibial plat fx- ORIF, 2 operations by DrHardy, & hosp course complic by ileus, TIA, electrolyte abn, etc;  Event disch to Rehab & he was attended by [facility];  Mult med changes & adjustments ==> we had a difficult time w/ med reconciliation but have the current list now up to date & accurate...    When last seen 6/12 his DM control was poor & A1c=11.2 on Metform1000Bid+Glybur10Bid; he was offered insulin vs Endocrine consult but he opted for max oral meds & Actos30+Onglyza5 added; he had no f/u visits prior to his 11/12 [facility] for fractured leg> in hosp BSs ~150-200+ and A1c=7.2; covered w/ SSI==> to nursing home rehab; we don't have rehab notes but DrGates disch pt on same prev & 1/13 f/u w/ TP showed BS~230 & A1c=7.1; we discussed  better diet, get wt down, & same 4 meds for now...    He had a TIA while off his ASA/ Plavix in the perioperative period; he was placed on Lovenox & continued this in the NH but his Plavix has not yet been restarted; pt instructed to restart the Plavix & continue his ASA (dual med converage w/ his severe vasc dis)...    Also had UTI after disch & saw TP w/ culture +Enterococcus, sens Levaquin, treated & symptoms resolved;  Also noted to have renal insuffic w/ BUN=43, Creat=2.3, K=5.4;  Lisinopril/ Lasix/ KCl- all stopped & f/u improved...    Ortho f/u DrHardy due soon; he is still non-weight bearing since his 2 operations (1st- external fixation, 2nd- ORIF) but anticipating start of phys therapy soon...    He has persistent pain in right hip area- referred from back & prev responded to shots from DrElsner/ [place]; he will call for follow up...    Wife feels that he is depressed & wants him on medication; he laughs & does not agree (thinks he is more anxious) so we settled on Zoloft 50mg /d as trial...    Note: spent w/ pt/ wife/ reviewing Hosp records/ subseq labs/ med reconciliation/ & counseling...           Problem List:  OBSTRUCTIVE SLEEP APNEA (ICD-327.23) - s/p split night sleep study 11/08- showing an AHI of 19, assoc w/ an O2 sat down to 78%... during the CPAP trial he titrated up to a pressure of 11cm but never ret to REM sleep so optimal pressure is still ???...he uses [facility] for his CPAP needs... ~  12/10: he tells me that he has stopped the CPAP, that he sleeps better without it, not snoring/ rests well, & wakes feeling refreshed... he refuses Sleep Med re-eval etc...  Hx of HYPERTENSION - not currently on medications...  ~  6/12: BP= 136/80 off meds & denies HA, visual changes, CP, palipit, dizziness, syncope, dyspnea, edema, etc... he states that home BP checks are "OK". ~  1/13: he was sent home from the NH/ Rehab on Lisinopril & Lasix; both stopped 1/13 by TP  w/ renal insuffic & elev K+; subseq improved & BP stable... ~  2/13: BP= 112/68 & feeling weak etc...  CEREBROVASCULAR DISEASE (ICD-437.9) - stable on ASA 325mg /d & PLAVIX 75mg /d per DrDeveshwar/ DrLawson/ DrReynolds... he has severe extracranial cerebrovasc disease- esp in the post circulation, and he is s/p vertebral art angioplasty x 2, and w/ a known basilar art occlusion- prev on Coumadin controlled by Neurology (switched to ASA/ Plavix in 2010)... also followed by [facility]  and DrDeveshwar for IR... he had a left CAE in 6/00 by [facility]... ~  CDoppler 2/07 showed 0-39% bilat ICA stenoses, s/p left CAE w/ DPA... ~  eval 3/10 by Windell Moulding w/ CDopplers that showed similar patency w/ 0-39% bilat ICA stenoses; due to the 10 yr interval DrLawson did not feel he needed any additional follow up. ~  12/10: continued f/u DrDevershwar IR w/ MRI/ MRAs showing bilat carotid siphon region atherosclerosis & narrowing (unchanged), and markedly diseased distal left vertebral & prox basilar art narrowing (no change)... ~  11/12: CTA head & neck showed similar severe dis w/ mod to severe bilat cavernous ICA stenoses due to extensive atherosclerosis; severe distal vertebral atherosclerosis- distal right occluded, distal left w/ high grade stenosis; mod to severe bilat PCA stenoses; chr cerebellar infarcts, no acute infarcts.  VENOUS INSUFFICIENCY (ICD-459.81) - he knows to avoid sodium, elevate legs, wear support hose...  HYPERCHOLESTEROLEMIA (ICD-272.0) - on CRESTOR 10mg /d since Hosp 12/12... ~  FLP 6/08 showed TChol 153, TG 120, HDL 36, LDL 93 ~  FLP 8/09 showed TChol 131, TG 84, HDL 38, LDL 76 ~  2010:  pt was not fasting for blood work... rec to continue Crestor10 + diet efforts. ~  FLP 10/11 showed TChol 210, TG 161, HDL 40, LDL 149... rec to start Crestor 20mg /d, he will decide. ~  FLP 6/12 on Cres20 showed TChol 106, TG 95, HDL 41, LDL 46... Continue same. ~  FLP 12/12 in Hosp (on Cres20 PTA) showed TChol  100, TG 101, HDL 37, LDL 43... They decr to Cres10.  DIABETES MELLITUS (ICD-250.00) - on METFORMIN 500mg -2Bid, GLYBURIDE 5mg -2Bid, ACTOS 30mg /d, & ONGLYZA 5mg /d since 6/12 OV... ~  labs 6/08 showed BS= 155, HgA1c= 8.1.Marland Kitchen. (glyburide incr to 5Bid). ~  labs 11/08 showed BS= 352, HgA1c= 8.1.... (Actos added). ~  labs 8/09 showed BS= 128, HgA1c= 7.1.Marland KitchenMarland Kitchen rec- same meds, better diet, get wt down! ~  labs 2/10 showed BS= 249, A1c= 7.3.... Metform incr to 2Bid. ~  labs 12/10 (nonfasting- on all 3 meds) showed BS= 87, A1c= 6.9 ~  labs 10/11 on Metform1000Bid+Gybur5Bid showed BS= 180, A1c= 8.4.Marland KitchenMarland Kitchen rec to incr GLYBUR10Bid + diet. ~  labs 6/12 on Metform1000Bid+Glybur10Bid showed BS= 196, A1c= 11.2 ?what happened?> he declined insulin & Endocrine consult, opted for max oral meds by adding ACTOS 30mg /d & ONGLYZA 5mg /d... ~  Labs 12/12 in [place] showed BS 150-200+ and A1c= 7.2; covered w/ SSI in hosp & placed back on 4 oral meds on disch from rehab... ~  Labs 1/13 on all 4 meds showed BS= 230, A1c=7.1; we reviewed diet & he will start PT soon...  OVERWEIGHT (ICD-278.02) - he states that he is not on much of a diet & we reviewed low carb/ no sweets/ low fat diet. ~  weight 8/09 = 216# ~  weight 2/10 = 216# ~  weight 12/10 = 212# ~  weight 10/11 = 210# ~  Weight 6/12 = 206# and he admits to not being on much of a diet... ~  Weight 2/13 = non weight bearing still...  GERD (ICD-530.81) - prev on Nexium40, he switched to OTC Prilosec, now PROTONIX 40mg /d due to Plavix Rx... ~  EGD 19/03 showed 4cmHH, GERD & esoph stricture dilated... ~  EGD 12/09 by DrPatterson showed HH & severe erosive esophagitis from GERD...  DIVERTICULOSIS OF COLON (ICD-562.10) - note: he was hosp in  briefly 12/09 for fecal impaction; prev on Miralax & Colase but recently noted loose stools on  the Metformin 1000mg Bid... ~  colonoscopy 10/03 by DrPatterson showed divertics only... ~  colonoscopy 12/09 by DrPatterson was also normal x  for divertics...  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 Hx of RENAL CELL CANCER (ICD-189.0) - he had a partial nephrectomy of the right side of his horseshoe kidney in 1994 due to renal cell ca... still followed by DrPeterson yearly by his hx but we don't have any recent notes... ~  labs 10/11 showed PSA= 0.95  Hx of NEPHROLITHIASIS (ICD-592.0) - remote hx of kidney stones followed by DrPeterson...  DEGENERATIVE JOINT DISEASE (ICD-715.90) - and elbow tendonitis Rx'd OTC Prn...  GOUT (ICD-274.9) - on ALLOPURINOL 300mg /d... ~  labs 10/11 showed URIC= 4.7  BACK PAIN, LUMBAR (ICD-724.2) - known degenerative spondylitic disease at L4-5 and L5-S1 prev followed by [facility] (now DrElsner & DrHarkins)... ~  6/12: he reports further back pain w/ eval by Chiropractor showing L4 disc prob & regular adjustments; but now notes incr pain & he requests referral to Neurosurg for further eval ==> seen by DrElsner, then DrHarkins for shots which has helped...  ANXIETY (ICD-300.00) - uses ALPRAZOLAM 0.5mg  as needed.   Past Surgical History  Procedure Date  . Incisional hernia repair 1/96    Dr.Leone  . Nasal sinus surgery 12/96    Dr.Redman  . Cholecystectomy 9/04    Dr. Maryagnes Amos  . Partial nephrectomy 1994    renal cell cancer in horseshoe kidney  . Carotid endarterectomy 09/2000    Dr.Lawson  . Veterbral art angioplasty     x2  . Small intestine surgery   . External fixation leg 03/10/2011    Procedure: EXTERNAL FIXATION LEG;  Surgeon: Budd Palmer;  Location: [facility] OR;  Service: Orthopedics;  Laterality: Right;  . Orif tibia plateau 03/24/2011    Procedure: OPEN REDUCTION INTERNAL FIXATION (ORIF) TIBIAL PLATEAU;  Surgeon: Budd Palmer;  Location: [facility] OR;  Service: Orthopedics;  Laterality: Right;    Outpatient Encounter Prescriptions as of 05/15/2011  Medication Sig Dispense Refill  . allopurinol (ZYLOPRIM) 300 MG tablet Take 1 tablet  (300 mg total) by mouth daily.  90 tablet  3  . ALPRAZolam (XANAX) 0.5 MG tablet TAKE ONE-HALF TO ONE TABLET BY MOUTH THREE TIMES DAILY AS NEEDED FOR  NERVES  90 tablet  2  . aspirin 325 MG EC tablet Take 325 mg by mouth daily.        Marland Kitchen b complex vitamins tablet Take 1 tablet by mouth daily.      . cholecalciferol (VITAMIN D) 1000 UNITS tablet Take 1,000 Units by mouth daily.      . clopidogrel (PLAVIX) 75 MG tablet Take 1 tablet (75 mg total) by mouth daily.  90 tablet  3  . co-enzyme Q-10 30 MG capsule Take 30 mg by mouth 3 (three) times daily.      Marland Kitchen docusate sodium (COLACE) 100 MG capsule Take 100 mg by mouth daily.        Marland Kitchen glyBURIDE (DIABETA) 5 MG tablet Take 2 tablets by mouth two times daily  360 tablet  3  . HYDROcodone-acetaminophen (NORCO) 5-325 MG per tablet Take 1 tablet by mouth every 6 (six) hours as needed.        Marland Kitchen levofloxacin (LEVAQUIN) 500 MG tablet Take 1 tablet (500 mg total) by mouth daily.  10 tablet  0  . metFORMIN (GLUCOPHAGE) 500 MG tablet Take 2 tablets by mouth two times  daily  360 tablet  3  . Multiple Vitamin (MULTIVITAMIN) tablet Take 1 tablet by mouth daily.        . ondansetron (ZOFRAN) 4 MG tablet Take 1 tablet (4 mg total) by mouth every 8 (eight) hours as needed for nausea.  30 tablet  1  . pantoprazole (PROTONIX) 40 MG tablet Take 1 tablet (40 mg total) by mouth daily at 12 noon.  90 tablet  3  . pioglitazone (ACTOS) 30 MG tablet Take 1 tablet (30 mg total) by mouth daily.  90 tablet  3  . rosuvastatin (CRESTOR) 10 MG tablet Take 1 tablet (10 mg total) by mouth daily.  90 tablet  3  . saxagliptin HCl (ONGLYZA) 5 MG TABS tablet Take 1 tablet (5 mg total) by mouth daily.  90 tablet  3  . sertraline (ZOLOFT) 50 MG tablet Take 1 tablet (50 mg total) by mouth daily.  90 tablet  3    Allergies  Allergen Reactions  . Hydromorphone Hcl     REACTION: hallucinations  . Promethazine Hcl     REACTION: hallucinations    Current Medications, Allergies, Past  Medical History, Past Surgical History, Family History, and Social History were reviewed in [employer] record.    Review of Systems        See HPI - all other systems neg except as noted...  The patient complains of dyspnea on exertion.  The patient denies anorexia, fever, weight loss, weight gain, vision loss, decreased hearing, hoarseness, chest pain, syncope, peripheral edema, prolonged cough, headaches, hemoptysis, abdominal pain, melena, hematochezia, severe indigestion/heartburn, hematuria, incontinence, muscle weakness, suspicious skin lesions, transient blindness, difficulty walking, depression, unusual weight change, abnormal bleeding, enlarged lymph nodes, and angioedema.     Objective:   Physical Exam     WD, WN, 71 y/o WM in NAD...  GENERAL:  Alert & oriented; pleasant & cooperative... HEENT:  [place]/AT, EOM-wnl, PERRLA, EACs-clear, TMs-wnl, NOSE-clear, THROAT-clear & wnl. NECK:  Supple w/ fairROM; no JVD; s/p left CAE, +bruits; no thyromegaly or nodules palpated; no lymphadenopathy. CHEST:  Clear to P & A; without wheezes/ rales/ or rhonchi heard... HEART:  Regular Rhythm; gr 1/6 SEM without rubs or gallops detected... ABDOMEN:  Soft & nontender; normal bowel sounds; no organomegaly or masses palpated... EXT: right knee deformity s/p ORIF, mod arthritic changes; no varicose veins/ +venous insuffic/ 1+ edema. NEURO:  CN's intact; no focal neuro deficits... DERM:  No lesions noted; no rash etc...  RADIOLOGY DATA:  Reviewed in the EPIC EMR & discussed w/ the patient...    >>CXR 11/12 showed borderline heart size, tortuous aorta, clear & NAD...    >>EKG 12/12 showed NSR, rate92, NSSTTWA...  LABORATORY DATA:  Reviewed in the EPIC EMR & discussed w/ the patient...   Assessment & Plan:   s/p fall w/ right Tibial plateaux fx> 1st ext fixation, 2nd ORIF>  By DrHardy, Ortho; pt still non weight bearing & hoping to start rehab soon...   CEREBROVASC Disease &  TIA 12/12>  Known severe cerebrovasc dis & he had TIA while in hosp for fx leg (he was off plavix at the time); treated w/ Lovenox perioperatively but he hasn't yet restarted the Plavix; pt rec to take ASA/ Plavix daily & he has f/u w/ Neuro scheduled...  He is w/o cerebral ischemic symptoms, s/p left CAE in 2000 & 2 vertebral art angioplasties in 2001 & 2002...  CHOL>  on Crestor 10mg  /d now & we will f/u FLP  on return visit...  DM>  After last visit 6/12 he decided to take all 4 oral meds & A1c has improved from 11.2 to 7.1; continue current meds + diet & exercise will be poss soon...  Overweight>  We discussed low carb, low fat wt reducing diet...  GI>  GERD controlled w/ Protonix; his bowel movements have been soft & regular...  Hx Renal Cell Ca in right side of horseshoe kidney> removed in 1994 & follwed regularly by DrPeterson...  DJD/ Hx Gout/ LBP>  He was seeing chiropractor regularly regarding LBP & then f/u w/ DrElsner,Neurosurg; he rec ESI by DrHarkins & the shots have helped...  Anxiety>  He remains on Alpraz Prn...   Patient's Medications  New Prescriptions   SERTRALINE (ZOLOFT) 50 MG TABLET    Take 1 tablet (50 mg total) by mouth daily.  Previous Medications   ALPRAZOLAM (XANAX) 0.5 MG TABLET    TAKE ONE-HALF TO ONE TABLET BY MOUTH THREE TIMES DAILY AS NEEDED FOR  NERVES   ASPIRIN 325 MG EC TABLET    Take 325 mg by mouth daily.     B COMPLEX VITAMINS TABLET    Take 1 tablet by mouth daily.   CHOLECALCIFEROL (VITAMIN D) 1000 UNITS TABLET    Take 1,000 Units by mouth daily.   CO-ENZYME Q-10 30 MG CAPSULE    Take 30 mg by mouth 3 (three) times daily.   DOCUSATE SODIUM (COLACE) 100 MG CAPSULE    Take 100 mg by mouth daily.     HYDROCODONE-ACETAMINOPHEN (NORCO) 5-325 MG PER TABLET    Take 1 tablet by mouth every 6 (six) hours as needed.     LEVOFLOXACIN (LEVAQUIN) 500 MG TABLET    Take 1 tablet (500 mg total) by mouth daily.   MULTIPLE VITAMIN (MULTIVITAMIN) TABLET    Take 1  tablet by mouth daily.     ONDANSETRON (ZOFRAN) 4 MG TABLET    Take 1 tablet (4 mg total) by mouth every 8 (eight) hours as needed for nausea.  Modified Medications   Modified Medication Previous Medication   ALLOPURINOL (ZYLOPRIM) 300 MG TABLET allopurinol (ZYLOPRIM) 300 MG tablet      Take 1 tablet (300 mg total) by mouth daily.    TAKE ONE TABLET BY MOUTH EVERY DAY   CLOPIDOGREL (PLAVIX) 75 MG TABLET clopidogrel (PLAVIX) 75 MG tablet      Take 1 tablet (75 mg total) by mouth daily.    Take 75 mg by mouth daily.   GLYBURIDE (DIABETA) 5 MG TABLET glyBURIDE (DIABETA) 5 MG tablet      Take 2 tablets by mouth two times daily    Take 2 tablets by mouth two times daily   METFORMIN (GLUCOPHAGE) 500 MG TABLET metFORMIN (GLUCOPHAGE) 500 MG tablet      Take 2 tablets by mouth two times daily    TAKE TWO TABLETS BY MOUTH TWICE DAILY   PANTOPRAZOLE (PROTONIX) 40 MG TABLET pantoprazole (PROTONIX) 40 MG tablet      Take 1 tablet (40 mg total) by mouth daily at 12 noon.    Take 1 tablet (40 mg total) by mouth daily at 12 noon.   PIOGLITAZONE (ACTOS) 30 MG TABLET pioglitazone (ACTOS) 30 MG tablet      Take 1 tablet (30 mg total) by mouth daily.    Take 1 tablet (30 mg total) by mouth daily.   ROSUVASTATIN (CRESTOR) 10 MG TABLET rosuvastatin (CRESTOR) 10 MG tablet      Take 1  tablet (10 mg total) by mouth daily.    Take 10 mg by mouth daily.   SAXAGLIPTIN HCL (ONGLYZA) 5 MG TABS TABLET saxagliptin HCl (ONGLYZA) 5 MG TABS tablet      Take 1 tablet (5 mg total) by mouth daily.    Take 1 tablet (5 mg total) by mouth daily.  Discontinued Medications   FUROSEMIDE (LASIX) 40 MG TABLET    Take one tablet on  Monday, Wednesday and Friday    METOCLOPRAMIDE (REGLAN) 5 MG TABLET    Take 5 mg by mouth 3 (three) times daily before meals.     ROSUVASTATIN (CRESTOR) 20 MG TABLET    Take 1 tablet (20 mg total) by mouth at bedtime.

## 2011-05-15 NOTE — Progress Notes (Deleted)
Subjective:     Patient ID: Peter Nicholson, male   DOB: [birth year], 71 y.o.   MRN: [id number]  HPI   Review of Systems     Objective:   Physical Exam     Assessment:     ***    Plan:     ***

## 2011-06-29 ENCOUNTER — Other Ambulatory Visit (INDEPENDENT_AMBULATORY_CARE_PROVIDER_SITE_OTHER): Payer: Medicare Other

## 2011-06-29 ENCOUNTER — Encounter: Payer: Self-pay | Admitting: Pulmonary Disease

## 2011-06-29 ENCOUNTER — Ambulatory Visit (INDEPENDENT_AMBULATORY_CARE_PROVIDER_SITE_OTHER): Payer: Medicare Other | Admitting: Pulmonary Disease

## 2011-06-29 VITALS — BP 140/72 | HR 80 | Temp 97.0°F | Ht 66.0 in | Wt 193.4 lb

## 2011-06-29 DIAGNOSIS — M545 Low back pain: Secondary | ICD-10-CM

## 2011-06-29 DIAGNOSIS — R972 Elevated prostate specific antigen [PSA]: Secondary | ICD-10-CM

## 2011-06-29 DIAGNOSIS — K449 Diaphragmatic hernia without obstruction or gangrene: Secondary | ICD-10-CM

## 2011-06-29 DIAGNOSIS — I10 Essential (primary) hypertension: Secondary | ICD-10-CM

## 2011-06-29 DIAGNOSIS — E7800 Pure hypercholesterolemia, unspecified: Secondary | ICD-10-CM

## 2011-06-29 DIAGNOSIS — E663 Overweight: Secondary | ICD-10-CM

## 2011-06-29 DIAGNOSIS — I679 Cerebrovascular disease, unspecified: Secondary | ICD-10-CM

## 2011-06-29 DIAGNOSIS — G459 Transient cerebral ischemic attack, unspecified: Secondary | ICD-10-CM

## 2011-06-29 DIAGNOSIS — E119 Type 2 diabetes mellitus without complications: Secondary | ICD-10-CM

## 2011-06-29 DIAGNOSIS — C649 Malignant neoplasm of unspecified kidney, except renal pelvis: Secondary | ICD-10-CM

## 2011-06-29 DIAGNOSIS — K219 Gastro-esophageal reflux disease without esophagitis: Secondary | ICD-10-CM

## 2011-06-29 DIAGNOSIS — I872 Venous insufficiency (chronic) (peripheral): Secondary | ICD-10-CM

## 2011-06-29 DIAGNOSIS — F411 Generalized anxiety disorder: Secondary | ICD-10-CM

## 2011-06-29 DIAGNOSIS — M1990 Unspecified osteoarthritis, unspecified site: Secondary | ICD-10-CM

## 2011-06-29 DIAGNOSIS — M109 Gout, unspecified: Secondary | ICD-10-CM

## 2011-06-29 DIAGNOSIS — D62 Acute posthemorrhagic anemia: Secondary | ICD-10-CM

## 2011-06-29 LAB — CBC WITH DIFFERENTIAL/PLATELET
Eosinophils Relative: 5.3 % — ABNORMAL HIGH (ref 0.0–5.0)
HCT: 34.4 % — ABNORMAL LOW (ref 39.0–52.0)
Lymphs Abs: 1.4 10*3/uL (ref 0.7–4.0)
MCV: 98.4 fl (ref 78.0–100.0)
Monocytes Absolute: 0.7 10*3/uL (ref 0.1–1.0)
Neutro Abs: 4.9 10*3/uL (ref 1.4–7.7)
Platelets: 196 10*3/uL (ref 150.0–400.0)
RDW: 13.1 % (ref 11.5–14.6)
WBC: 7.5 10*3/uL (ref 4.5–10.5)

## 2011-06-29 LAB — TSH: TSH: 3.51 u[IU]/mL (ref 0.35–5.50)

## 2011-06-29 LAB — BASIC METABOLIC PANEL
CO2: 27 mEq/L (ref 19–32)
Calcium: 9.4 mg/dL (ref 8.4–10.5)
Chloride: 110 mEq/L (ref 96–112)
Creatinine, Ser: 1.1 mg/dL (ref 0.4–1.5)
Glucose, Bld: 72 mg/dL (ref 70–99)

## 2011-06-29 LAB — LIPID PANEL
Cholesterol: 118 mg/dL (ref 0–200)
HDL: 44 mg/dL (ref >39.00)
Total CHOL/HDL Ratio: 3
Triglycerides: 67 mg/dL (ref 0.0–149.0)

## 2011-06-29 LAB — HEPATIC FUNCTION PANEL
ALT: 15 U/L (ref 0–53)
Albumin: 3.3 g/dL — ABNORMAL LOW (ref 3.5–5.2)
Bilirubin, Direct: 0.1 mg/dL (ref 0.0–0.3)
Total Protein: 6 g/dL (ref 6.0–8.3)

## 2011-06-29 NOTE — Patient Instructions (Signed)
Today we updated your med list in our EPIC system...    Continue your current medications the same...  Today we did your follow up FASTING blood work...    We will call you w/ the reusults when avail...  Keep up the good work w/ diet & exercise...  Call for any problems...  Let's plan a follow up visit in 3 months.Marland KitchenMarland Kitchen

## 2011-06-29 NOTE — Progress Notes (Addendum)
Subjective:    Patient ID: Peter Nicholson, male    DOB: [birth year], 71 y.o.   MRN: [id number]  HPI 72 y/o WM here for a follow up visit... he has multiple medical problems as noted below...   ~  May 21, 2008:  he is a severe vasculopath w/ vertebral art angioplasty by IR x2, and known basilar art occlusion... he remains on COUMADIN w/ protimes done by DrReynolds, Neuro...  also had left CAE in 2000 by [facility]... he denies any acute problems & feels that he has been doing well... weight = 216#, no change over the last 6months... since his brief hosp in [place] for the fecal impaction- he states "I have done some research & found a lady who does enema therapy"...  ~  April 02, 2009:  since he was here last he's had f/u [facility] 3/10 w/ CDopplers that were unchanged & no further VVS follow up deemed necessary per [facility]... he's also had 2 Q19mo MRI's of Head & Neck by DrDeveshwar w/ bilat carotid siphon region atherosclerosis & narrowing (unchanged), and markedly diseased distal left vertebral & prox basilar art narrowing & irregularity (no change)... they have recommended stopping the Coumadin & he is now on ASA & Plavix.  ~  January 15, 2010:  he's had a good 10 mo he says> active in yard, heats w/ wood & does his own wood splitting... unfortunately he hasn't lost any weight, & not really on diet... he stopped his Crestor "after talking to many people" and he stopped his Actos as well> f/u fasting labs shows LDL up to 149, and A1c up to 8.4.Marland KitchenMarland Kitchen we discussed options and he will decide what he wants to do about these serious medical problems (he decided to restart the Crestor but refuses the Actos)... due for f/u eval by DrDeveshwar/ IR to check the status of his posterior circ problems...  ~  September 30, 2010:  108mo ROV & he presents for f/u mult medical problems> He stiil cuts his own wood & mows 3 yards for exercise; not on much of a diet by his admission "I just don't eat as much, I feel great &  cut back on sweets".    Known severe cerebrovasc dis w/ prev left CAE 2000 by [facility] & two vertebral art angioplasties by DrDeveshwar for IR in 2001 & 2002; he has been stable w/o cerebral ischemic symptoms on ASA & Plavix since then...     Hx hypercholesterolemia & he had stopped his Crestor on his own last fall> f/u labs showed LDL 149 & he decided to restart the Crestor 20mg /d & has been stable on this- due for f/u FLP ==> TChol 106, TG 95, HDL 41, LDL 46    Hx DM on Metformin & Glyburide which was increased at his last OV; he had stopped Actos & refused to restart; states BS at home all ~130-140 range & he is due for A1c check= 11.2 (?what happened?); offered max oral meds vs Levemir start...    He notes loose stools (on Miralax & Colase due to hx severe constip in past); discussed Metformin & rec that he stop the Miralax & Colase for now...    He notes LBP & has been seeing his chiropractor for adjustments for many months now; known degen spondylosis from Neurosurg eval DrRoy in 2007 & he requests referral for further eval & shots...  ~  May 15, 2011:  72mo ROV & post hosp check> He was [facility] 11/26 -  03/27/11 after fall from ladder cleaning gutters w/ complex right tibial plat fx- ORIF, 2 operations by DrHardy, & hosp course complic by ileus, TIA, electrolyte abn, etc;  Event disch to Rehab & he was attended by [facility];  Mult med changes & adjustments ==> we had a difficult time w/ med reconciliation but have the current list now up to date & accurate...    When last seen 6/12 his DM control was poor & A1c=11.2 on Metform1000Bid+Glybur10Bid; he was offered insulin vs Endocrine consult but he opted for max oral meds & Actos30+Onglyza5 added; he had no f/u visits prior to his 11/12 [facility] for fractured leg> in hosp BSs ~150-200+ and A1c=7.2; covered w/ SSI==> to nursing home rehab; we don't have rehab notes but DrGates disch pt on same prev & 1/13 f/u w/ TP showed BS~230 & A1c=7.1; we discussed  better diet, get wt down, & same 4 meds for now...    He had a TIA while off his ASA/ Plavix in the perioperative period; he was placed on Lovenox & continued this in the NH but his Plavix has not yet been restarted; pt instructed to restart the Plavix & continue his ASA (dual med converage w/ his severe vasc dis)...    Also had UTI after disch & saw TP w/ culture +Enterococcus, sens Levaquin, treated & symptoms resolved;  Also noted to have renal insuffic w/ BUN=43, Creat=2.3, K=5.4;  Lisinopril/ Lasix/ KCl- all stopped & f/u improved...    Ortho f/u DrHardy due soon; he is still non-weight bearing since his 2 operations (1st- external fixation, 2nd- ORIF) but anticipating start of phys therapy soon...    He has persistent pain in right hip area- referred from back & prev responded to shots from DrElsner/ [place]; he will call for follow up...    Wife feels that he is depressed & wants him on medication; he laughs & does not agree (thinks he is more anxious) so we settled on Zoloft 50mg /d as trial...    Note: spent w/ pt/ wife/ reviewing Hosp records/ subseq labs/ med reconciliation/ & counseling...   ~  June 29, 2011:  6wk ROV & he has some persist leg discomfort & due to f/u w/ DrHandy soon; he's noted some mild swelling & we reviewed no salt, elevation, TEDs;  We also reviewed his medical problems, medications, XRays, & LABS today> see prob list below>>  LABS 3/13:  FLP- at goals on Cres10;  Chems- all wnl & A1c=6.6 on ; CBC- Hg=11.5;  TSH=3.51;  VitD= 39;  PSA=2.75   Problem List:    << PROBLEM LIST UPDATED 06/29/11 >>  OBSTRUCTIVE SLEEP APNEA (ICD-327.23) - s/p split night sleep study 11/08- showing an AHI of 19, assoc w/ an O2 sat down to 78%... during the CPAP trial he titrated up to a pressure of 11cm but never ret to REM sleep so optimal pressure is still ???...he uses [facility] for his CPAP needs... ~  12/10: he tells me that he has stopped the CPAP, that he  sleeps better without it, not snoring/ rests well, & wakes feeling refreshed... he refuses Sleep Med re-eval etc...  Hx of HYPERTENSION - not currently on medications...  ~  6/12: BP= 136/80 off meds & denies HA, visual changes, CP, palipit, dizziness, syncope, dyspnea, edema, etc... he states that home BP checks are "OK". ~  1/13: he was sent home from the NH/ Rehab on Lisinopril & Lasix; both stopped 1/13 by TP  w/ renal insuffic & elev K+; subseq improved & BP stable... ~  2/13: BP= 112/68 & feeling weak etc...  3/13:  BP= 140/72 & feeling some better...  CEREBROVASCULAR DISEASE (ICD-437.9) - stable on ASA 325mg /d & PLAVIX 75mg /d per DrDeveshwar/ DrLawson/ DrReynolds... he has severe extracranial cerebrovasc disease- esp in the post circulation, and he is s/p vertebral art angioplasty x 2, and w/ a known basilar art occlusion- prev on Coumadin controlled by Neurology (switched to ASA/ Plavix in 2010)... also followed by Windell Moulding and DrDeveshwar for IR... he had a left CAE in 6/00 by [facility]... ~  CDoppler 2/07 showed 0-39% bilat ICA stenoses, s/p left CAE w/ DPA... ~  eval 3/10 by Windell Moulding w/ CDopplers that showed similar patency w/ 0-39% bilat ICA stenoses; due to the 10 yr interval DrLawson did not feel he needed any additional follow up. ~  12/10: continued f/u DrDevershwar IR w/ MRI/ MRAs showing bilat carotid siphon region atherosclerosis & narrowing (unchanged), and markedly diseased distal left vertebral & prox basilar art narrowing (no change)... ~  11/12: CTA head & neck showed similar severe dis w/ mod to severe bilat cavernous ICA stenoses due to extensive atherosclerosis; severe distal vertebral atherosclerosis- distal right occluded, distal left w/ high grade stenosis; mod to severe bilat PCA stenoses; chr cerebellar infarcts, no acute infarcts.  VENOUS INSUFFICIENCY (ICD-459.81) - he knows to avoid sodium, elevate legs, wear support hose...  HYPERCHOLESTEROLEMIA (ICD-272.0) - on  CRESTOR 10mg /d since Hosp 12/12... ~  FLP 6/08 showed TChol 153, TG 120, HDL 36, LDL 93 ~  FLP 8/09 showed TChol 131, TG 84, HDL 38, LDL 76 ~  2010:  pt was not fasting for blood work... rec to continue Crestor10 + diet efforts. ~  FLP 10/11 showed TChol 210, TG 161, HDL 40, LDL 149... rec to start Crestor 20mg /d, he will decide. ~  FLP 6/12 on Cres20 showed TChol 106, TG 95, HDL 41, LDL 46... Continue same. ~  FLP 12/12 in Hosp (on Cres20 PTA) showed TChol 100, TG 101, HDL 37, LDL 43... They decr to Cres10. ~  FLP 3/13 on Cres10 showed TChol 118, TG 67, HDL 44, LDL 61... Continue same.  DIABETES MELLITUS (ICD-250.00) - on METFORMIN 500mg -2Bid, GLYBURIDE 5mg -2Bid, ACTOS 30mg /d, & ONGLYZA 5mg /d since 6/12 OV... ~  labs 6/08 showed BS= 155, HgA1c= 8.1.Marland Kitchen. (glyburide incr to 5Bid). ~  labs 11/08 showed BS= 352, HgA1c= 8.1.... (Actos added). ~  labs 8/09 showed BS= 128, HgA1c= 7.1.Marland KitchenMarland Kitchen rec- same meds, better diet, get wt down! ~  labs 2/10 showed BS= 249, A1c= 7.3.... Metform incr to 2Bid. ~  labs 12/10 (nonfasting- on all 3 meds) showed BS= 87, A1c= 6.9 ~  labs 10/11 on Metform1000Bid+Gybur5Bid showed BS= 180, A1c= 8.4.Marland KitchenMarland Kitchen rec to incr GLYBUR10Bid + diet. ~  labs 6/12 on Metform1000Bid+Glybur10Bid showed BS= 196, A1c= 11.2 ?what happened?> he declined insulin & Endocrine consult, opted for max oral meds by adding ACTOS 30mg /d & ONGLYZA 5mg /d... ~  Labs 12/12 in [place] showed BS 150-200+ and A1c= 7.2; covered w/ SSI in hosp & placed back on 4 oral meds on disch from rehab... ~  Labs 1/13 on all 4 meds showed BS= 230, A1c=7.1; we reviewed diet & he will start PT soon... ~  Labs 3/13 on showed BS= 72, A1c= 6.6  OVERWEIGHT (ICD-278.02) - he states that he is not on much of a diet & we reviewed low carb/ no sweets/ low fat diet. ~  weight 8/09 = 216# ~  weight 2/10 = 216# ~  weight 12/10 = 212# ~  weight 10/11 = 210# ~  Weight 6/12 = 206# and he admits to not being on much of a diet... ~  Weight  2/13 = non weight bearing still... ~  Weight 3/13 = 193#  GERD (ICD-530.81) - prev on Nexium40, he switched to OTC Prilosec, now PROTONIX 40mg /d due to Plavix Rx... ~  EGD 19/03 showed 4cmHH, GERD & esoph stricture dilated... ~  EGD 12/09 by DrPatterson showed HH & severe erosive esophagitis from GERD...  DIVERTICULOSIS OF COLON (ICD-562.10) - note: he was hosp in [place] briefly 12/09 for fecal impaction; prev on Miralax & Colase but recently noted loose stools on the Metformin 1000mg Bid... ~  colonoscopy 10/03 by DrPatterson showed divertics only... ~  colonoscopy 12/09 by DrPatterson was also normal x for divertics...  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 Hx of RENAL CELL CANCER (ICD-189.0) - he had a partial nephrectomy of the right side of his horseshoe kidney in 1994 due to renal cell ca... still followed by DrPeterson yearly by his hx but we don't have any recent notes... ~  labs 10/11 showed PSA= 0.95 ~  Labs 3/13 showed BUN=21, Creat=1.1, PSA=2.75  Hx of NEPHROLITHIASIS (ICD-592.0) - remote hx of kidney stones followed by DrPeterson...  DEGENERATIVE JOINT DISEASE (ICD-715.90) - and elbow tendonitis Rx'd OTC Prn...  GOUT (ICD-274.9) - on ALLOPURINOL 300mg /d... ~  labs 10/11 showed URIC= 4.7  BACK PAIN, LUMBAR (ICD-724.2) - known degenerative spondylitic disease at L4-5 and L5-S1 prev followed by [facility] (now DrElsner & DrHarkins)... ~  6/12: he reports further back pain w/ eval by Chiropractor showing L4 disc prob & regular adjustments; but now notes incr pain & he requests referral to Neurosurg for further eval ==> seen by DrElsner, then DrHarkins for shots which has helped...  ANXIETY (ICD-300.00) - uses ALPRAZOLAM 0.5mg  as needed.   Past Surgical History  Procedure Date  . Incisional hernia repair 1/96    Dr.Leone  . Nasal sinus surgery 12/96    Dr.Redman  . Cholecystectomy 9/04    Dr. Maryagnes Amos  . Partial  nephrectomy 1994    renal cell cancer in horseshoe kidney  . Carotid endarterectomy 09/2000    Dr.Lawson  . Veterbral art angioplasty     x2  . Small intestine surgery   . External fixation leg 03/10/2011    Procedure: EXTERNAL FIXATION LEG;  Surgeon: Budd Palmer;  Location: [facility] OR;  Service: Orthopedics;  Laterality: Right;  . Orif tibia plateau 03/24/2011    Procedure: OPEN REDUCTION INTERNAL FIXATION (ORIF) TIBIAL PLATEAU;  Surgeon: Budd Palmer;  Location: [facility] OR;  Service: Orthopedics;  Laterality: Right;    Outpatient Encounter Prescriptions as of 06/29/2011  Medication Sig Dispense Refill  . allopurinol (ZYLOPRIM) 300 MG tablet Take 1 tablet (300 mg total) by mouth daily.  90 tablet  3  . ALPRAZolam (XANAX) 0.5 MG tablet TAKE ONE-HALF TO ONE TABLET BY MOUTH THREE TIMES DAILY AS NEEDED FOR  NERVES  90 tablet  2  . aspirin 325 MG EC tablet Take 325 mg by mouth daily.        Marland Kitchen b complex vitamins tablet Take 1 tablet by mouth daily.      . cholecalciferol (VITAMIN D) 1000 UNITS tablet Take 1,000 Units by mouth daily.      . clopidogrel (PLAVIX) 75 MG tablet Take 1 tablet (75 mg total)  by mouth daily.  90 tablet  3  . co-enzyme Q-10 30 MG capsule Take 30 mg by mouth 3 (three) times daily.      Marland Kitchen docusate sodium (COLACE) 100 MG capsule Take 100 mg by mouth daily.        Marland Kitchen glyBURIDE (DIABETA) 5 MG tablet Take 2 tablets by mouth two times daily  360 tablet  3  . HYDROcodone-acetaminophen (NORCO) 5-325 MG per tablet Take 1 tablet by mouth every 6 (six) hours as needed.        . metFORMIN (GLUCOPHAGE) 500 MG tablet Take 2 tablets by mouth two times daily  360 tablet  3  . Multiple Vitamin (MULTIVITAMIN) tablet Take 1 tablet by mouth daily.        . ondansetron (ZOFRAN) 4 MG tablet Take 1 tablet (4 mg total) by mouth every 8 (eight) hours as needed for nausea.  30 tablet  1  . pantoprazole (PROTONIX) 40 MG tablet Take 1 tablet (40 mg total) by mouth daily at 12 noon.  90 tablet  3  .  pioglitazone (ACTOS) 30 MG tablet Take 1 tablet (30 mg total) by mouth daily.  90 tablet  3  . rosuvastatin (CRESTOR) 10 MG tablet Take 1 tablet (10 mg total) by mouth daily.  90 tablet  3  . saxagliptin HCl (ONGLYZA) 5 MG TABS tablet Take 1 tablet (5 mg total) by mouth daily.  90 tablet  3  . DISCONTD: sertraline (ZOLOFT) 50 MG tablet Take 1 tablet (50 mg total) by mouth daily.  90 tablet  3    Allergies  Allergen Reactions  . Hydromorphone Hcl     REACTION: hallucinations  . Promethazine Hcl     REACTION: hallucinations    Current Medications, Allergies, Past Medical History, Past Surgical History, Family History, and Social History were reviewed in [employer] record.    Review of Systems        See HPI - all other systems neg except as noted...  The patient complains of dyspnea on exertion.  The patient denies anorexia, fever, weight loss, weight gain, vision loss, decreased hearing, hoarseness, chest pain, syncope, peripheral edema, prolonged cough, headaches, hemoptysis, abdominal pain, melena, hematochezia, severe indigestion/heartburn, hematuria, incontinence, muscle weakness, suspicious skin lesions, transient blindness, difficulty walking, depression, unusual weight change, abnormal bleeding, enlarged lymph nodes, and angioedema.     Objective:   Physical Exam     WD, WN, 71 y/o WM in NAD...  GENERAL:  Alert & oriented; pleasant & cooperative... HEENT:  [place]/AT, EOM-wnl, PERRLA, EACs-clear, TMs-wnl, NOSE-clear, THROAT-clear & wnl. NECK:  Supple w/ fairROM; no JVD; s/p left CAE, +bruits; no thyromegaly or nodules palpated; no lymphadenopathy. CHEST:  Clear to P & A; without wheezes/ rales/ or rhonchi heard... HEART:  Regular Rhythm; gr 1/6 SEM without rubs or gallops detected... ABDOMEN:  Soft & nontender; normal bowel sounds; no organomegaly or masses palpated... EXT: right knee deformity s/p ORIF, mod arthritic changes; no varicose veins/ +venous  insuffic/ 1+ edema. NEURO:  CN's intact; no focal neuro deficits... DERM:  No lesions noted; no rash etc...  RADIOLOGY DATA:  Reviewed in the EPIC EMR & discussed w/ the patient...    >>CXR 11/12 showed borderline heart size, tortuous aorta, clear & NAD...    >>EKG 12/12 showed NSR, rate92, NSSTTWA...  LABORATORY DATA:  Reviewed in the EPIC EMR & discussed w/ the patient...   Assessment & Plan:   s/p fall w/ right Tibial plateaux fx>  1st ext fixation, 2nd ORIF>  By DrHardy, Ortho; pt still non weight bearing & hoping to start rehab soon...   CEREBROVASC Disease & TIA 12/12>  Known severe cerebrovasc dis & he had TIA while in hosp for fx leg (he was off plavix at the time); treated w/ Lovenox perioperatively but he hasn't yet restarted the Plavix; pt rec to take ASA/ Plavix daily & he has f/u w/ Neuro scheduled...  He is w/o cerebral ischemic symptoms, s/p left CAE in 2000 & 2 vertebral art angioplasties in 2001 & 2002...  CHOL>  on Crestor 10mg  /d now & FLP looks great!  DM>  After last visit 6/12 he decided to take all 4 oral meds & A1c has improved from 11.2 to 6.6 now; continue current meds + diet & exercise will be poss soon...  Overweight>  We discussed low carb, low fat wt reducing diet...  GI>  GERD controlled w/ Protonix; his bowel movements have been soft & regular...  Hx Renal Cell Ca in right side of horseshoe kidney> removed in 1994 & follwed regularly by DrPeterson...  DJD/ Hx Gout/ LBP>  He was seeing chiropractor regularly regarding LBP & then f/u w/ DrElsner,Neurosurg; he rec ESI by DrHarkins & the shots have helped...  Anxiety>  He remains on Alpraz Prn...   Patient's Medications  New Prescriptions   No medications on file  Previous Medications   ALLOPURINOL (ZYLOPRIM) 300 MG TABLET    Take 1 tablet (300 mg total) by mouth daily.   ALPRAZOLAM (XANAX) 0.5 MG TABLET    TAKE ONE-HALF TO ONE TABLET BY MOUTH THREE TIMES DAILY AS NEEDED FOR  NERVES   ASPIRIN 325 MG  EC TABLET    Take 325 mg by mouth daily.     B COMPLEX VITAMINS TABLET    Take 1 tablet by mouth daily.   CHOLECALCIFEROL (VITAMIN D) 1000 UNITS TABLET    Take 1,000 Units by mouth daily.   CLOPIDOGREL (PLAVIX) 75 MG TABLET    Take 1 tablet (75 mg total) by mouth daily.   CO-ENZYME Q-10 30 MG CAPSULE    Take 30 mg by mouth 3 (three) times daily.   DOCUSATE SODIUM (COLACE) 100 MG CAPSULE    Take 100 mg by mouth daily.     GLYBURIDE (DIABETA) 5 MG TABLET    Take 2 tablets by mouth two times daily   HYDROCODONE-ACETAMINOPHEN (NORCO) 5-325 MG PER TABLET    Take 1 tablet by mouth every 6 (six) hours as needed.     METFORMIN (GLUCOPHAGE) 500 MG TABLET    Take 2 tablets by mouth two times daily   MULTIPLE VITAMIN (MULTIVITAMIN) TABLET    Take 1 tablet by mouth daily.     ONDANSETRON (ZOFRAN) 4 MG TABLET    Take 1 tablet (4 mg total) by mouth every 8 (eight) hours as needed for nausea.   PANTOPRAZOLE (PROTONIX) 40 MG TABLET    Take 1 tablet (40 mg total) by mouth daily at 12 noon.   PIOGLITAZONE (ACTOS) 30 MG TABLET    Take 1 tablet (30 mg total) by mouth daily.   ROSUVASTATIN (CRESTOR) 10 MG TABLET    Take 1 tablet (10 mg total) by mouth daily.   SAXAGLIPTIN HCL (ONGLYZA) 5 MG TABS TABLET    Take 1 tablet (5 mg total) by mouth daily.  Modified Medications   No medications on file  Discontinued Medications   SERTRALINE (ZOLOFT) 50 MG TABLET    Take 1  tablet (50 mg total) by mouth daily.

## 2011-07-13 ENCOUNTER — Telehealth: Payer: Self-pay | Admitting: Pulmonary Disease

## 2011-07-13 NOTE — Telephone Encounter (Signed)
Per SN---decrease the glyburide to 5 mg bid and continue to monitor the BS.  Med list has been updated.  thanks

## 2011-07-13 NOTE — Telephone Encounter (Signed)
I spoke with spouse and she stated pt Peter Nicholson has been dropping. Friday morning it was 68 and this morning it was 61. She states pt is taking his medications as directed. Metformin 500 2 po BIDm glyburide 5 mg 2 po BID, and onglyza 5 mg QD. She states they are going out of town tomorrow. She is requesting recs from SN. Please advise thanks

## 2011-07-13 NOTE — Telephone Encounter (Signed)
I spoke with pt and is aware of SN recs. He voiced his understanding and had no questions 

## 2011-07-20 ENCOUNTER — Ambulatory Visit: Payer: Medicare Other | Admitting: Pulmonary Disease

## 2011-07-21 ENCOUNTER — Telehealth: Payer: Self-pay | Admitting: Pulmonary Disease

## 2011-07-21 MED ORDER — GLUCOSE BLOOD VI STRP: ORAL_STRIP | Status: AC

## 2011-07-21 NOTE — Telephone Encounter (Signed)
Test strips have been sent to the pharmacy per pts request.  Pt is aware.

## 2011-09-18 ENCOUNTER — Other Ambulatory Visit: Payer: Self-pay | Admitting: Pulmonary Disease

## 2011-09-29 ENCOUNTER — Ambulatory Visit (INDEPENDENT_AMBULATORY_CARE_PROVIDER_SITE_OTHER): Payer: Medicare Other | Admitting: Pulmonary Disease

## 2011-09-29 ENCOUNTER — Encounter: Payer: Self-pay | Admitting: Pulmonary Disease

## 2011-09-29 ENCOUNTER — Other Ambulatory Visit (INDEPENDENT_AMBULATORY_CARE_PROVIDER_SITE_OTHER): Payer: Medicare Other

## 2011-09-29 VITALS — BP 138/78 | HR 82 | Temp 96.8°F | Ht 66.0 in | Wt 190.8 lb

## 2011-09-29 DIAGNOSIS — F411 Generalized anxiety disorder: Secondary | ICD-10-CM

## 2011-09-29 DIAGNOSIS — M109 Gout, unspecified: Secondary | ICD-10-CM

## 2011-09-29 DIAGNOSIS — K219 Gastro-esophageal reflux disease without esophagitis: Secondary | ICD-10-CM

## 2011-09-29 DIAGNOSIS — M1990 Unspecified osteoarthritis, unspecified site: Secondary | ICD-10-CM

## 2011-09-29 DIAGNOSIS — C649 Malignant neoplasm of unspecified kidney, except renal pelvis: Secondary | ICD-10-CM

## 2011-09-29 DIAGNOSIS — I872 Venous insufficiency (chronic) (peripheral): Secondary | ICD-10-CM

## 2011-09-29 DIAGNOSIS — I679 Cerebrovascular disease, unspecified: Secondary | ICD-10-CM

## 2011-09-29 DIAGNOSIS — Z79899 Other long term (current) drug therapy: Secondary | ICD-10-CM

## 2011-09-29 DIAGNOSIS — E119 Type 2 diabetes mellitus without complications: Secondary | ICD-10-CM

## 2011-09-29 DIAGNOSIS — M545 Low back pain: Secondary | ICD-10-CM

## 2011-09-29 DIAGNOSIS — I10 Essential (primary) hypertension: Secondary | ICD-10-CM

## 2011-09-29 DIAGNOSIS — K5730 Diverticulosis of large intestine without perforation or abscess without bleeding: Secondary | ICD-10-CM

## 2011-09-29 DIAGNOSIS — E7800 Pure hypercholesterolemia, unspecified: Secondary | ICD-10-CM

## 2011-09-29 LAB — BASIC METABOLIC PANEL
BUN: 32 mg/dL — ABNORMAL HIGH (ref 6–23)
CO2: 28 mEq/L (ref 19–32)
Chloride: 108 mEq/L (ref 96–112)
Potassium: 4.8 mEq/L (ref 3.5–5.1)

## 2011-09-29 LAB — HEMOGLOBIN A1C: Hgb A1c MFr Bld: 7.4 % — ABNORMAL HIGH (ref 4.6–6.5)

## 2011-09-29 MED ORDER — ALLOPURINOL 300 MG PO TABS: 300.0000 mg | ORAL_TABLET | Freq: Every day | ORAL | Status: DC

## 2011-09-29 NOTE — Patient Instructions (Addendum)
Today we updated your med list in our EPIC system...    Continue your current medications the same...  Congrats on your diet & exercise w/ sl further decr in weight...    As a result your BS is improved & we've been able to decrease your meds as we discussed...    Today I want you to decr the GLYBURIDE to just 5mg  (1 tab) in the AM only...    Please decrease or stop the Boost as well...  Today we rechecked your Diabetic labs...    We will call you w/ the results & any further adjustment in your medications...  Call for any problems, or if your sugars are too hi ot too low at home...  Let's plan a follow up visit in 4 months.Marland KitchenMarland Kitchen

## 2011-09-29 NOTE — Progress Notes (Signed)
Subjective:    Patient ID: Peter Nicholson, male    DOB: [birth year], 71 y.o.   MRN: [id number]  HPI 71 y/o WM here for a follow up visit... he has multiple medical problems as noted below...   ~  Feb10:  he is a severe vasculopath w/ vertebral art angioplasty by IR x2, and known basilar art occlusion... he remains on COUMADIN w/ protimes done by DrReynolds, Neuro...  also had left CAE in 2000 by [facility]... he denies any acute problems & feels that he has been doing well... weight = 216#, no change over the last 6months... since his brief hosp in [place] for the fecal impaction- he states "I have done some research & found a lady who does enema therapy"... ~  Dec10:  since he was here last he's had f/u [facility] 3/10 w/ CDopplers that were unchanged & no further VVS follow up deemed necessary per [facility]... he's also had 2 Q29mo MRI's of Head & Neck by DrDeveshwar w/ bilat carotid siphon region atherosclerosis & narrowing (unchanged), and markedly diseased distal left vertebral & prox basilar art narrowing & irregularity (no change)... they have recommended stopping the Coumadin & he is now on ASA & Plavix. ~  Oct11:  he's had a good 10 mo he says> active in yard, heats w/ wood & does his own wood splitting... unfortunately he hasn't lost any weight, & not really on diet... he stopped his Crestor "after talking to many people" and he stopped his Actos as well> f/u fasting labs shows LDL up to 149, and A1c up to 8.4.Marland KitchenMarland Kitchen we discussed options and he will decide what he wants to do about these serious medical problems (he decided to restart the Crestor but refuses the Actos)... due for f/u eval by DrDeveshwar/ IR to check the status of his posterior circ problems...  ~  September 30, 2010:  30mo ROV & he presents for f/u mult medical problems> He stiil cuts his own wood & mows 3 yards for exercise; not on much of a diet by his admission "I just don't eat as much, I feel great & cut back on sweets".    Known severe  cerebrovasc dis w/ prev left CAE 2000 by [facility] & two vertebral art angioplasties by DrDeveshwar for IR in 2001 & 2002; he has been stable w/o cerebral ischemic symptoms on ASA & Plavix since then...     Hx hypercholesterolemia & he had stopped his Crestor on his own last fall> f/u labs showed LDL 149 & he decided to restart the Crestor 20mg /d & has been stable on this- due for f/u FLP ==> TChol 106, TG 95, HDL 41, LDL 46    Hx DM on Metformin & Glyburide which was increased at his last OV; he had stopped Actos & refused to restart; states BS at home all ~130-140 range & he is due for A1c check= 11.2 (?what happened?); offered max oral meds vs Levemir start...    He notes loose stools (on Miralax & Colase due to hx severe constip in past); discussed Metformin & rec that he stop the Miralax & Colase for now...    He notes LBP & has been seeing his chiropractor for adjustments for many months now; known degen spondylosis from Neurosurg eval DrRoy in 2007 & he requests referral for further eval & shots...  ~  May 15, 2011:  72mo ROV & post hosp check> He was [facility] 11/26 - 03/27/11 after fall from ladder cleaning gutters w/ complex  right tibial plat fx- ORIF, 2 operations by DrHardy, & hosp course complic by ileus, TIA, electrolyte abn, etc;  Event disch to Rehab & he was attended by [facility];  Mult med changes & adjustments ==> we had a difficult time w/ med reconciliation but have the current list now up to date & accurate...    When last seen 6/12 his DM control was poor & A1c=11.2 on Metform1000Bid+Glybur10Bid; he was offered insulin vs Endocrine consult but he opted for max oral meds & Actos30+Onglyza5 added; he had no f/u visits prior to his 11/12 [facility] for fractured leg> in hosp BSs ~150-200+ and A1c=7.2; covered w/ SSI==> to nursing home rehab; we don't have rehab notes but DrGates disch pt on same prev & 1/13 f/u w/ TP showed BS~230 & A1c=7.1; we discussed better diet, get wt down, & same 4 meds  for now...    He had a TIA while off his ASA/ Plavix in the perioperative period; he was placed on Lovenox & continued this in the NH but his Plavix has not yet been restarted; pt instructed to restart the Plavix & continue his ASA (dual med converage w/ his severe vasc dis)...    Also had UTI after disch & saw TP w/ culture +Enterococcus, sens Levaquin, treated & symptoms resolved;  Also noted to have renal insuffic w/ BUN=43, Creat=2.3, K=5.4;  Lisinopril/ Lasix/ KCl- all stopped & f/u improved...    Ortho f/u DrHardy due soon; he is still non-weight bearing since his 2 operations (1st- external fixation, 2nd- ORIF) but anticipating start of phys therapy soon...    He has persistent pain in right hip area- referred from back & prev responded to shots from DrElsner/ [place]; he will call for follow up...    Wife feels that he is depressed & wants him on medication; he laughs & does not agree (thinks he is more anxious) so we settled on Zoloft 50mg /d as trial...    Note: spent w/ pt/ wife/ reviewing Hosp records/ subseq labs/ med reconciliation/ & counseling...   ~  June 29, 2011:  6wk ROV & he has some persist leg discomfort & due to f/u w/ DrHandy soon; he's noted some mild swelling & we reviewed no salt, elevation, TEDs;  We also reviewed his medical problems, medications, XRays, & LABS today> see prob list below>>  LABS 3/13:  FLP- at goals on Cres10;  Chems- all wnl & A1c=6.6 on ; CBC- Hg=11.5;  TSH=3.51;  VitD= 39;  PSA=2.75  ~  September 29, 2011:  58mo ROV & [place] called [place] in the interval since his last visit w/ some hypoglycemia- BS down to 40's at night & he decreased his Metformin & Glyburide from 2Bid to 1Bid; he was also drinking Boost up to 3 cans daily; since the adjustments his sugars have normalized w/ FBS in the 70-90 range & no hypoglycemia but still doing the Boost supplements Qhs; his weight is down 3# to 191# & he is asked to stop the Boost for now & decr the Glyburide to 5mg   Qam only- labs pending today for further adjustments...    We reviewed prob list, meds, xrays and labs> see below>> LABS 6/18:  Chems- BS=190 A1c=7.4 and we decided to decr the Glyburide to 5mg Qam only, but keep the Metform500-2Bid & same Actos & Onglyza...   Problem List:     OBSTRUCTIVE SLEEP APNEA (ICD-327.23) - s/p split night sleep study 11/08- showing an AHI of 19, assoc w/ an  O2 sat down to 78%... during the CPAP trial he titrated up to a pressure of 11cm but never ret to REM sleep so optimal pressure is still ???...he uses [facility] for his CPAP needs... ~  12/10: he tells me that he has stopped the CPAP, that he sleeps better without it, not snoring/ rests well, & wakes feeling refreshed... he refuses Sleep Med re-eval etc...  Hx of HYPERTENSION - not currently on medications...  ~  6/12: BP= 136/80 off meds & denies HA, visual changes, CP, palipit, dizziness, syncope, dyspnea, edema, etc... he states that home BP checks are "OK". ~  1/13: he was sent home from the NH/ Rehab on Lisinopril & Lasix; both stopped 1/13 by TP w/ renal insuffic & elev K+; subseq improved & BP stable... ~  2/13: BP= 112/68 & feeling weak etc...  3/13:  BP= 140/72 & feeling some better... ~  6/13:  BP= 138/78 & feeling much better; denies CP, palpit, ch in SOB or edema...  CEREBROVASCULAR DISEASE (ICD-437.9) - stable on ASA 325mg /d & PLAVIX 75mg /d per DrDeveshwar/ DrLawson/ DrReynolds... he has severe extracranial cerebrovasc disease- esp in the post circulation, and he is s/p vertebral art angioplasty x 2, and w/ a known basilar art occlusion- prev on Coumadin controlled by Neurology (switched to ASA/ Plavix in 2010)... also followed by Windell Moulding and DrDeveshwar for IR... he had a left CAE in 6/00 by [facility] ~  CDoppler 2/07 showed 0-39% bilat ICA stenoses, s/p left CAE w/ DPA... ~  eval 3/10 by Windell Moulding w/ CDopplers that showed similar patency w/ 0-39% bilat ICA stenoses; due to the 10 yr  interval DrLawson did not feel he needed any additional follow up. ~  12/10: continued f/u DrDevershwar IR w/ MRI/ MRAs showing bilat carotid siphon region atherosclerosis & narrowing (unchanged), and markedly diseased distal left vertebral & prox basilar art narrowing (no change)... ~  11/12: CTA head & neck showed similar severe dis w/ mod to severe bilat cavernous ICA stenoses due to extensive atherosclerosis; severe distal vertebral atherosclerosis- distal right occluded, distal left w/ high grade stenosis; mod to severe bilat PCA stenoses; chr cerebellar infarcts, no acute infarcts.  VENOUS INSUFFICIENCY (ICD-459.81) - he knows to avoid sodium, elevate legs, wear support hose...  HYPERCHOLESTEROLEMIA (ICD-272.0) - on CRESTOR 10mg /d since Hosp 12/12... ~  FLP 6/08 showed TChol 153, TG 120, HDL 36, LDL 93 ~  FLP 8/09 showed TChol 131, TG 84, HDL 38, LDL 76 ~  2010:  pt was not fasting for blood work... rec to continue Crestor10 + diet efforts. ~  FLP 10/11 showed TChol 210, TG 161, HDL 40, LDL 149... rec to start Crestor 20mg /d, he will decide. ~  FLP 6/12 on Cres20 showed TChol 106, TG 95, HDL 41, LDL 46... Continue same. ~  FLP 12/12 in Hosp (on Cres20 PTA) showed TChol 100, TG 101, HDL 37, LDL 43... They decr to Cres10. ~  FLP 3/13 on Cres10 showed TChol 118, TG 67, HDL 44, LDL 61... Continue same.  DIABETES MELLITUS (ICD-250.00) - on METFORMIN 500mg - 2Bid, GLYBURIDE 5mg Bid, ACTOS 30mg /d, & ONGLYZA 5mg /d... ~  labs 6/08 showed BS= 155, HgA1c= 8.1.Marland Kitchen. (glyburide incr to 5Bid). ~  labs 11/08 showed BS= 352, HgA1c= 8.1.... (Actos added). ~  labs 8/09 showed BS= 128, HgA1c= 7.1.Marland KitchenMarland Kitchen rec- same meds, better diet, get wt down! ~  labs 2/10 showed BS= 249, A1c= 7.3.... Metform incr to 2Bid. ~  labs 12/10 (nonfasting- on all 3 meds) showed  BS= 87, A1c= 6.9 ~  labs 10/11 on Metform1000Bid+Gybur5Bid showed BS= 180, A1c= 8.4.Marland KitchenMarland Kitchen rec to incr GLYBUR10Bid + diet. ~  labs 6/12 on Metform1000Bid+Glybur10Bid  showed BS= 196, A1c= 11.2 ?what happened?> he declined insulin & Endocrine consult, opted for max oral meds by adding ACTOS 30mg /d & ONGLYZA 5mg /d... ~  Labs 12/12 in [place] showed BS 150-200+ and A1c= 7.2; covered w/ SSI in hosp & placed back on 4 oral meds on disch from rehab... ~  Labs 1/13 on all 4 meds showed BS= 230, A1c=7.1; we reviewed diet & he will start PT soon... ~  Labs 3/13 on showed BS= 72, A1c= 6.6 ~  4/13: pt called w/ hypoglycemia & we decreased Glyburide 5mg  from 2Bid to 1Bid... ~  6/13:  Pt on Metform500-2Bid, Glybur5mg Bid, Actos30, Onglyza5; BS at home 70-90 he says & still drinking Boost; Labs showed BS=190  A1c=7.4  ; we decided to cut the Glyburide5mg  Qam only but keep the Metform500-2Bid +Actos +Onglyza...  OVERWEIGHT (ICD-278.02) - he states that he is not on much of a diet & we reviewed low carb/ no sweets/ low fat diet. ~  weight 8/09 = 216# ~  weight 2/10 = 216# ~  weight 12/10 = 212# ~  weight 10/11 = 210# ~  Weight 6/12 = 206# and he admits to not being on much of a diet... ~  Weight 2/13 = non weight bearing still... ~  Weight 3/13 = 193# ~  Weight 6/13 = 191#  GERD (ICD-530.81) - prev on Nexium40, he switched to OTC Prilosec, now PROTONIX 40mg /d due to Plavix Rx... ~  EGD 19/03 showed 4cmHH, GERD & esoph stricture dilated... ~  EGD 12/09 by DrPatterson showed HH & severe erosive esophagitis from GERD...  DIVERTICULOSIS OF COLON (ICD-562.10) - note: he was hosp in [place] briefly 12/09 for fecal impaction; prev on Miralax & Colase but recently noted loose stools on the Metformin 1000mg Bid... ~  colonoscopy 10/03 by DrPatterson showed divertics only... ~  colonoscopy 12/09 by DrPatterson was also normal x for divertics...  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 Hx of RENAL CELL CANCER (ICD-189.0) - he had a partial nephrectomy of the right side of his horseshoe kidney in 1994 due to renal cell ca... still  followed by DrPeterson yearly by his hx but we don't have any recent notes... ~  labs 10/11 showed PSA= 0.95 ~  Labs 3/13 showed BUN=21, Creat=1.1, PSA=2.75  Hx of NEPHROLITHIASIS (ICD-592.0) - remote hx of kidney stones followed by DrPeterson...  DEGENERATIVE JOINT DISEASE (ICD-715.90) - and elbow tendonitis Rx'd OTC Prn...  GOUT (ICD-274.9) - on ALLOPURINOL 300mg /d... ~  labs 10/11 showed URIC= 4.7  BACK PAIN, LUMBAR (ICD-724.2) - known degenerative spondylitic disease at L4-5 and L5-S1 prev followed by [facility] for [facility]urg (now DrElsner & DrHarkins)... ~  6/12: he reports further back pain w/ eval by Chiropractor showing L4 disc prob & regular adjustments; but now notes incr pain & he requests referral to Neurosurg for further eval ==> seen by DrElsner, then DrHarkins for shots which has helped...  ANXIETY (ICD-300.00) - uses ALPRAZOLAM 0.5mg  as needed.   Past Surgical History  Procedure Date  . Incisional hernia repair 1/96    Dr.Leone  . Nasal sinus surgery 12/96    Dr.Redman  . Cholecystectomy 9/04    Dr. Maryagnes Amos  . Partial nephrectomy 1994    renal cell cancer in horseshoe kidney  . Carotid endarterectomy 09/2000  Dr.Lawson  . Veterbral art angioplasty     x2  . Small intestine surgery   . External fixation leg 03/10/2011    Procedure: EXTERNAL FIXATION LEG;  Surgeon: Budd Palmer;  Location: [facility] OR;  Service: Orthopedics;  Laterality: Right;  . Orif tibia plateau 03/24/2011    Procedure: OPEN REDUCTION INTERNAL FIXATION (ORIF) TIBIAL PLATEAU;  Surgeon: Budd Palmer;  Location: [facility] OR;  Service: Orthopedics;  Laterality: Right;    Outpatient Encounter Prescriptions as of 09/29/2011  Medication Sig Dispense Refill  . allopurinol (ZYLOPRIM) 300 MG tablet Take 1 tablet (300 mg total) by mouth daily.  90 tablet  3  . ALPRAZolam (XANAX) 0.5 MG tablet TAKE ONE-HALF TO ONE TABLET BY MOUTH THREE TIMES DAILY AS NEEDED FOR  NERVES  90 tablet  0  . aspirin 325 MG EC tablet  Take 325 mg by mouth daily.        Marland Kitchen b complex vitamins tablet Take 1 tablet by mouth daily.      . cholecalciferol (VITAMIN D) 1000 UNITS tablet Take 1,000 Units by mouth daily.      . clopidogrel (PLAVIX) 75 MG tablet Take 1 tablet (75 mg total) by mouth daily.  90 tablet  3  . co-enzyme Q-10 30 MG capsule Take 30 mg by mouth 3 (three) times daily.      Marland Kitchen docusate sodium (COLACE) 100 MG capsule Take 100 mg by mouth daily.        Marland Kitchen glucose blood (BAYER CONTOUR NEXT TEST) test strip Use as instructed  100 each  11  . glyBURIDE (DIABETA) 5 MG tablet Take 5 mg by mouth 2 (two) times daily with a meal.      . HYDROcodone-acetaminophen (NORCO) 5-325 MG per tablet Take 1 tablet by mouth every 6 (six) hours as needed.        . metFORMIN (GLUCOPHAGE) 500 MG tablet Take 2 tablets by mouth two times daily  360 tablet  3  . Multiple Vitamin (MULTIVITAMIN) tablet Take 1 tablet by mouth daily.        . ondansetron (ZOFRAN) 4 MG tablet Take 1 tablet (4 mg total) by mouth every 8 (eight) hours as needed for nausea.  30 tablet  1  . pantoprazole (PROTONIX) 40 MG tablet Take 1 tablet (40 mg total) by mouth daily at 12 noon.  90 tablet  3  . pioglitazone (ACTOS) 30 MG tablet Take 1 tablet (30 mg total) by mouth daily.  90 tablet  3  . rosuvastatin (CRESTOR) 10 MG tablet Take 1 tablet (10 mg total) by mouth daily.  90 tablet  3  . saxagliptin HCl (ONGLYZA) 5 MG TABS tablet Take 1 tablet (5 mg total) by mouth daily.  90 tablet  3  . DISCONTD: allopurinol (ZYLOPRIM) 300 MG tablet Take 1 tablet (300 mg total) by mouth daily.  90 tablet  3    Allergies  Allergen Reactions  . Hydromorphone Hcl     REACTION: hallucinations  . Promethazine Hcl     REACTION: hallucinations    Current Medications, Allergies, Past Medical History, Past Surgical History, Family History, and Social History were reviewed in [employer] record.    Review of Systems        See HPI - all other systems neg except  as noted...  The patient complains of dyspnea on exertion.  The patient denies anorexia, fever, weight loss, weight gain, vision loss, decreased hearing, hoarseness, chest pain, syncope, peripheral  edema, prolonged cough, headaches, hemoptysis, abdominal pain, melena, hematochezia, severe indigestion/heartburn, hematuria, incontinence, muscle weakness, suspicious skin lesions, transient blindness, difficulty walking, depression, unusual weight change, abnormal bleeding, enlarged lymph nodes, and angioedema.     Objective:   Physical Exam     WD, WN, 72 y/o WM in NAD...  GENERAL:  Alert & oriented; pleasant & cooperative... HEENT:  [place]/AT, EOM-wnl, PERRLA, EACs-clear, TMs-wnl, NOSE-clear, THROAT-clear & wnl. NECK:  Supple w/ fairROM; no JVD; s/p left CAE, +bruits; no thyromegaly or nodules palpated; no lymphadenopathy. CHEST:  Clear to P & A; without wheezes/ rales/ or rhonchi heard... HEART:  Regular Rhythm; gr 1/6 SEM without rubs or gallops detected... ABDOMEN:  Soft & nontender; normal bowel sounds; no organomegaly or masses palpated... EXT: right knee deformity s/p ORIF, mod arthritic changes; no varicose veins/ +venous insuffic/ 1+ edema. NEURO:  CN's intact; no focal neuro deficits... DERM:  No lesions noted; no rash etc...  RADIOLOGY DATA:  Reviewed in the EPIC EMR & discussed w/ the patient...    >>CXR 11/12 showed borderline heart size, tortuous aorta, clear & NAD...    >>EKG 12/12 showed NSR, rate92, NSSTTWA...  LABORATORY DATA:  Reviewed in the EPIC EMR & discussed w/ the patient...   Assessment & Plan:    DM>  With his BS=190 & A1c up to 7.4 we decided to keep the glyburide at 5mg Qam (to avoid nocturnal hypoglycemia), and the Metformin500-2Bid, Actos30 & Ongly5.   CEREBROVASC Disease & TIA 12/12>  Known severe cerebrovasc dis & he had TIA while in hosp for fx leg (he was off plavix at the time); treated w/ Lovenox perioperatively but he hasn't yet restarted the Plavix; pt  rec to take ASA/ Plavix daily & he has f/u w/ Neuro scheduled...  He is w/o cerebral ischemic symptoms, s/p left CAE in 2000 & 2 vertebral art angioplasties in 2001 & 2002...  CHOL>  on Crestor 10mg  /d now & FLP looks great!  Overweight>  We discussed low carb, low fat wt reducing diet...  GI>  GERD controlled w/ Protonix; his bowel movements have been soft & regular...  Hx Renal Cell Ca in right side of horseshoe kidney> removed in 1994 & follwed regularly by DrPeterson...  DJD/ Hx Gout/ LBP>  He was seeing chiropractor regularly regarding LBP & then f/u w/ DrElsner,Neurosurg; he rec ESI by DrHarkins & the shots have helped...  s/p fall w/ right Tibial plateaux fx> 1st ext fixation, 2nd ORIF>  By DrHardy, Ortho; pt still non weight bearing & hoping to start rehab soon...  Anxiety>  He remains on Alpraz Prn...   Patient's Medications  New Prescriptions   No medications on file  Previous Medications   ALPRAZOLAM (XANAX) 0.5 MG TABLET    TAKE ONE-HALF TO ONE TABLET BY MOUTH THREE TIMES DAILY AS NEEDED FOR  NERVES   ASPIRIN 325 MG EC TABLET    Take 325 mg by mouth daily.     B COMPLEX VITAMINS TABLET    Take 1 tablet by mouth daily.   CHOLECALCIFEROL (VITAMIN D) 1000 UNITS TABLET    Take 1,000 Units by mouth daily.   CO-ENZYME Q-10 30 MG CAPSULE    Take 30 mg by mouth 3 (three) times daily.   DOCUSATE SODIUM (COLACE) 100 MG CAPSULE    Take 100 mg by mouth daily.     GLUCOSE BLOOD (BAYER CONTOUR NEXT TEST) TEST STRIP    Use as instructed   HYDROCODONE-ACETAMINOPHEN (NORCO) 5-325 MG PER TABLET  Take 1 tablet by mouth every 6 (six) hours as needed.     MULTIPLE VITAMIN (MULTIVITAMIN) TABLET    Take 1 tablet by mouth daily.     ONDANSETRON (ZOFRAN) 4 MG TABLET    Take 1 tablet (4 mg total) by mouth every 8 (eight) hours as needed for nausea.   PIOGLITAZONE (ACTOS) 30 MG TABLET    Take 1 tablet (30 mg total) by mouth daily.   ROSUVASTATIN (CRESTOR) 10 MG TABLET    Take 1 tablet (10 mg  total) by mouth daily.   SAXAGLIPTIN HCL (ONGLYZA) 5 MG TABS TABLET    Take 1 tablet (5 mg total) by mouth daily.  Modified Medications   Modified Medication Previous Medication   ALLOPURINOL (ZYLOPRIM) 300 MG TABLET allopurinol (ZYLOPRIM) 300 MG tablet      Take 1 tablet (300 mg total) by mouth daily.    Take 1 tablet (300 mg total) by mouth daily.   CLOPIDOGREL (PLAVIX) 75 MG TABLET clopidogrel (PLAVIX) 75 MG tablet      Take 1 tablet (75 mg total) by mouth daily.    Take 1 tablet (75 mg total) by mouth daily.   GLYBURIDE (DIABETA) 5 MG TABLET glyBURIDE (DIABETA) 5 MG tablet      Take 1 tablet (5 mg total) by mouth daily with breakfast.    Take 5 mg by mouth daily with breakfast.    METFORMIN (GLUCOPHAGE) 500 MG TABLET metFORMIN (GLUCOPHAGE) 500 MG tablet      Take 2 tablets by mouth two times daily    Take 2 tablets by mouth two times daily   PANTOPRAZOLE (PROTONIX) 40 MG TABLET pantoprazole (PROTONIX) 40 MG tablet      Take 1 tablet (40 mg total) by mouth daily at 12 noon.    Take 1 tablet (40 mg total) by mouth daily at 12 noon.  Discontinued Medications   No medications on file

## 2011-10-01 ENCOUNTER — Other Ambulatory Visit: Payer: Self-pay | Admitting: Pulmonary Disease

## 2011-10-01 MED ORDER — METFORMIN HCL 500 MG PO TABS: ORAL_TABLET | ORAL | Status: DC

## 2011-10-01 MED ORDER — CLOPIDOGREL BISULFATE 75 MG PO TABS: 75.0000 mg | ORAL_TABLET | Freq: Every day | ORAL | Status: DC

## 2011-10-01 MED ORDER — PANTOPRAZOLE SODIUM 40 MG PO TBEC: 40.0000 mg | DELAYED_RELEASE_TABLET | Freq: Every day | ORAL | Status: DC

## 2011-10-01 MED ORDER — GLYBURIDE 5 MG PO TABS: 5.0000 mg | ORAL_TABLET | Freq: Every day | ORAL | Status: DC

## 2011-10-01 MED ORDER — ALLOPURINOL 300 MG PO TABS: 300.0000 mg | ORAL_TABLET | Freq: Every day | ORAL | Status: DC

## 2011-11-11 ENCOUNTER — Other Ambulatory Visit: Payer: Self-pay | Admitting: Pulmonary Disease

## 2011-12-04 ENCOUNTER — Telehealth: Payer: Self-pay | Admitting: Pulmonary Disease

## 2011-12-04 MED ORDER — ROSUVASTATIN CALCIUM 10 MG PO TABS: 10.0000 mg | ORAL_TABLET | Freq: Every day | ORAL | Status: DC

## 2011-12-04 NOTE — Telephone Encounter (Signed)
Called and spoke with pt and he requested that his crestor be sent in to [facility] drug for refills.  This has been done.  Pt also requested a copy of the drug list that we have for marley's be mailed to him.  This has been placed in the mail for the pt and he is aware. Nothing further is needed.

## 2012-01-18 ENCOUNTER — Ambulatory Visit: Payer: Medicare Other | Admitting: Pulmonary Disease

## 2012-01-18 ENCOUNTER — Other Ambulatory Visit (INDEPENDENT_AMBULATORY_CARE_PROVIDER_SITE_OTHER): Payer: Medicare Other

## 2012-01-18 ENCOUNTER — Encounter: Payer: Self-pay | Admitting: Pulmonary Disease

## 2012-01-18 ENCOUNTER — Ambulatory Visit (INDEPENDENT_AMBULATORY_CARE_PROVIDER_SITE_OTHER): Payer: Medicare Other | Admitting: Pulmonary Disease

## 2012-01-18 VITALS — BP 140/82 | HR 63 | Temp 97.4°F | Ht 66.0 in | Wt 206.0 lb

## 2012-01-18 DIAGNOSIS — E7800 Pure hypercholesterolemia, unspecified: Secondary | ICD-10-CM

## 2012-01-18 DIAGNOSIS — F411 Generalized anxiety disorder: Secondary | ICD-10-CM

## 2012-01-18 DIAGNOSIS — E119 Type 2 diabetes mellitus without complications: Secondary | ICD-10-CM

## 2012-01-18 DIAGNOSIS — N289 Disorder of kidney and ureter, unspecified: Secondary | ICD-10-CM

## 2012-01-18 DIAGNOSIS — Z23 Encounter for immunization: Secondary | ICD-10-CM

## 2012-01-18 DIAGNOSIS — K219 Gastro-esophageal reflux disease without esophagitis: Secondary | ICD-10-CM

## 2012-01-18 DIAGNOSIS — K449 Diaphragmatic hernia without obstruction or gangrene: Secondary | ICD-10-CM

## 2012-01-18 DIAGNOSIS — C649 Malignant neoplasm of unspecified kidney, except renal pelvis: Secondary | ICD-10-CM | POA: Insufficient documentation

## 2012-01-18 DIAGNOSIS — M1990 Unspecified osteoarthritis, unspecified site: Secondary | ICD-10-CM

## 2012-01-18 DIAGNOSIS — M545 Low back pain: Secondary | ICD-10-CM

## 2012-01-18 DIAGNOSIS — I10 Essential (primary) hypertension: Secondary | ICD-10-CM

## 2012-01-18 DIAGNOSIS — G459 Transient cerebral ischemic attack, unspecified: Secondary | ICD-10-CM

## 2012-01-18 DIAGNOSIS — R1310 Dysphagia, unspecified: Secondary | ICD-10-CM

## 2012-01-18 DIAGNOSIS — K222 Esophageal obstruction: Secondary | ICD-10-CM

## 2012-01-18 DIAGNOSIS — Q638 Other specified congenital malformations of kidney: Secondary | ICD-10-CM

## 2012-01-18 DIAGNOSIS — I679 Cerebrovascular disease, unspecified: Secondary | ICD-10-CM

## 2012-01-18 DIAGNOSIS — I872 Venous insufficiency (chronic) (peripheral): Secondary | ICD-10-CM

## 2012-01-18 LAB — CBC WITH DIFFERENTIAL/PLATELET
Basophils Relative: 0.7 % (ref 0.0–3.0)
Eosinophils Absolute: 0.6 10*3/uL (ref 0.0–0.7)
MCHC: 33 g/dL (ref 30.0–36.0)
MCV: 100.2 fl — ABNORMAL HIGH (ref 78.0–100.0)
Monocytes Absolute: 0.6 10*3/uL (ref 0.1–1.0)
Neutro Abs: 5.8 10*3/uL (ref 1.4–7.7)
Neutrophils Relative %: 69.7 % (ref 43.0–77.0)
RBC: 3.8 Mil/uL — ABNORMAL LOW (ref 4.22–5.81)

## 2012-01-18 LAB — BASIC METABOLIC PANEL
CO2: 28 mEq/L (ref 19–32)
Chloride: 106 mEq/L (ref 96–112)
Creatinine, Ser: 1.2 mg/dL (ref 0.4–1.5)

## 2012-01-18 MED ORDER — PANTOPRAZOLE SODIUM 40 MG PO TBEC: 40.0000 mg | DELAYED_RELEASE_TABLET | Freq: Every day | ORAL | Status: DC

## 2012-01-18 NOTE — Patient Instructions (Addendum)
Today we updated your med list in our EPIC system...    Continue your current medications the same...  We gave you 5d supply of DEXILANT (one cap daily) to take until your PROTONIX 40mg /d arrives from [facility]...  We will also set up an appt in GI ASAP to see about your dysphagia>    They will need to arrange your endoscopy & dilatation procedure...  Today we also did your follow up diabetic labs...    We will call you w/ the results when avail...  Call for any questions.Marland KitchenMarland Kitchen

## 2012-01-19 ENCOUNTER — Ambulatory Visit (INDEPENDENT_AMBULATORY_CARE_PROVIDER_SITE_OTHER): Payer: Medicare Other | Admitting: Nurse Practitioner

## 2012-01-19 ENCOUNTER — Encounter: Payer: Self-pay | Admitting: Nurse Practitioner

## 2012-01-19 VITALS — BP 152/80 | HR 88 | Ht 64.5 in | Wt 202.1 lb

## 2012-01-19 DIAGNOSIS — R1310 Dysphagia, unspecified: Secondary | ICD-10-CM

## 2012-01-19 DIAGNOSIS — K219 Gastro-esophageal reflux disease without esophagitis: Secondary | ICD-10-CM

## 2012-01-19 MED ORDER — ESOMEPRAZOLE MAGNESIUM 40 MG PO CPDR: DELAYED_RELEASE_CAPSULE | ORAL | Status: DC

## 2012-01-19 NOTE — Patient Instructions (Addendum)
We discussed importance of eating slowly, taking small bites of food, chewing well and consuming adequate amounts of fluids in between bites.  We sent a prescription for Nexium to [facility], [place] [place]. Take as directed. We made you an appointment with Dr. Jarold Motto on 02-09-2012 at 10:45 Am.  We have given you Reflux information.

## 2012-01-19 NOTE — Progress Notes (Signed)
01/19/2012 Peter Nicholson [id number] 02/11/1941   HISTORY OF PRESENT ILLNESS: Patient is a 71 year old male with multiple medical problems. He is on multiple medications including chronic Plavix. He was seen by Dr. Jarold Motto in 2009 for evaluation of heme positive stool. EGD revealed severe erosive esophagitis. He was treated with Nexium but stopped it when GERD symptoms improved after losing weight and discontinuing coffee.     Patient  presents with a 3 month history of intermittent solid food dysphagia. No odynophagia. Patient describes having had his esophagus stretched many years ago.  No heartburn or regurgitation. His weight is stable  Past Medical History  Diagnosis Date  . Obstructive sleep apnea (adult) (pediatric)   . HTN (hypertension)   . Cerebrovascular disease, unspecified   . Unspecified venous (peripheral) insufficiency   . Pure hypercholesterolemia   . Type II or unspecified type diabetes mellitus without mention of complication, not stated as uncontrolled   . Overweight   . Diaphragmatic hernia without mention of obstruction or gangrene   . Esophageal reflux   . Diverticulosis of colon (without mention of hemorrhage)   . Other specified congenital anomaly of kidney   . Kidney carcinoma   . Calculus of kidney   . Osteoarthrosis, unspecified whether generalized or localized, unspecified site   . Gout, unspecified   . Lumbago   . Anxiety state, unspecified   . Stroke   . History of gallstones   . IBS (irritable bowel syndrome)   . History of seizures   . Bowel obstruction    Past Surgical History  Procedure Date  . Incisional hernia repair 1/96    Dr.Leone  . Nasal sinus surgery 12/96    Dr.Redman  . Cholecystectomy 9/04    Dr. Maryagnes Amos  . Partial nephrectomy 1994    renal cell cancer in horseshoe kidney  . Carotid endarterectomy 09/2000    Dr.Lawson  . Veterbral art angioplasty     x2  . Small intestine surgery   . External fixation leg 03/10/2011   Procedure: EXTERNAL FIXATION LEG;  Surgeon: Budd Palmer;  Location: [facility] OR;  Service: Orthopedics;  Laterality: Right;  . Orif tibia plateau 03/24/2011    Procedure: OPEN REDUCTION INTERNAL FIXATION (ORIF) TIBIAL PLATEAU;  Surgeon: Budd Palmer;  Location: [facility] OR;  Service: Orthopedics;  Laterality: Right;    reports that he has never smoked. He has never used smokeless tobacco. He reports that he drinks alcohol. He reports that he does not use illicit drugs. family history includes Breast cancer in his sister; Dementia in his mother; Heart attack in his father; and Ovarian cancer in his paternal grandmother. Allergies  Allergen Reactions  . Hydromorphone Hcl     REACTION: hallucinations  . Promethazine Hcl     REACTION: hallucinations      Outpatient Encounter Prescriptions as of 01/19/2012  Medication Sig Dispense Refill  . allopurinol (ZYLOPRIM) 300 MG tablet Take 1 tablet (300 mg total) by mouth daily.  90 tablet  3  . ALPRAZolam (XANAX) 0.5 MG tablet TAKE ONE-HALF TO ONE TABLET BY MOUTH THREE TIMES DAILY AS NEEDED FOR  NERVES  90 tablet  5  . aspirin 325 MG EC tablet Take 325 mg by mouth daily.        Marland Kitchen b complex vitamins tablet Take 1 tablet by mouth daily.      . cholecalciferol (VITAMIN D) 1000 UNITS tablet Take 1,000 Units by mouth daily.      . clopidogrel (PLAVIX) 75 MG  tablet Take 1 tablet (75 mg total) by mouth daily.  90 tablet  3  . glucose blood (BAYER CONTOUR NEXT TEST) test strip Use as instructed  100 each  11  . glyBURIDE (DIABETA) 5 MG tablet Take 1 tablet (5 mg total) by mouth daily with breakfast.  90 tablet  3  . metFORMIN (GLUCOPHAGE) 500 MG tablet Take 2 tablets by mouth two times daily  360 tablet  3  . Multiple Vitamin (MULTIVITAMIN) tablet Take 1 tablet by mouth daily.        . pioglitazone (ACTOS) 30 MG tablet Take 1 tablet (30 mg total) by mouth daily.  90 tablet  3  . rosuvastatin (CRESTOR) 10 MG tablet Take 1 tablet (10 mg total) by mouth daily.  90  tablet  3  . saxagliptin HCl (ONGLYZA) 5 MG TABS tablet Take 1 tablet (5 mg total) by mouth daily.  90 tablet  3  . DISCONTD: co-enzyme Q-10 30 MG capsule Take 30 mg by mouth 3 (three) times daily.      Marland Kitchen DISCONTD: docusate sodium (COLACE) 100 MG capsule Take 100 mg by mouth daily.        Marland Kitchen DISCONTD: HYDROcodone-acetaminophen (NORCO) 5-325 MG per tablet Take 1 tablet by mouth every 6 (six) hours as needed.        Marland Kitchen DISCONTD: ondansetron (ZOFRAN) 4 MG tablet Take 1 tablet (4 mg total) by mouth every 8 (eight) hours as needed for nausea.  30 tablet  1  . DISCONTD: pantoprazole (PROTONIX) 40 MG tablet Take 1 tablet (40 mg total) by mouth daily at 12 noon.  90 tablet  3     REVIEW OF SYSTEMS  : All other systems reviewed and negative except where noted in the History of Present Illness.   PHYSICAL EXAM: BP 152/80  Pulse 88  Ht 5' 4.5" (1.638 m)  Wt 202 lb 2 oz (91.683 kg)  BMI 34.16 kg/m2 General: Well developed white male in no acute distress Head: Normocephalic and atraumatic Eyes:  sclerae anicteric,conjunctive pink. Ears: Normal auditory acuity Mouth: No oral lesions / exudate Neck: Supple, no masses.  Lungs: Clear throughout to auscultation Heart: Regular rate and rhythm Abdomen: Soft, nontender, non distended. No masses or hepatomegaly noted. Normal bowel sounds Musculoskeletal: Symmetrical with no gross deformities  Skin: No lesions on visible extremities Extremities: No edema  Neurological: Alert oriented x 4, grossly nonfocal Cervical Nodes:  No significant cervical adenopathy Psychological:  Alert and cooperative. Normal mood and affect  ASSESSMENT AND PLAN:  1. Three month history of solid food dysphagia. No weight loss. Patient has a history of severe esophagitis in 2009. He has not been on a PPI for at least 2 years. Suspect recurrent esophagitis secondary to GERD. Esophageal stricture not ruled out. Patient has multiple comorbidities, he is on multiple medications  including Plavix and is at increased risk for procedures. Will restart PPI therapy. If no improvement and dysphasia over the next couple of weeks then he will barium swallow with tablet and for upper endoscopy with dilation. In the meantime we discussed importance of eating slowly, taking small bites of food, chewing well and consuming adequate amounts of fluids in between bites.  2. Chronic plavix, apparently takes for history of CVA  3. diverticulosis. Colonoscopy December 2009 otherwise normal.  4. Multiple medical problems.

## 2012-01-20 ENCOUNTER — Encounter: Payer: Self-pay | Admitting: Nurse Practitioner

## 2012-01-21 NOTE — Progress Notes (Signed)
Agree with Ms. Guenther's assessment and plan. Emara Lichter E. Rylah Fukuda, MD, FACG   

## 2012-01-29 ENCOUNTER — Ambulatory Visit: Payer: Medicare Other | Admitting: Pulmonary Disease

## 2012-02-08 ENCOUNTER — Encounter: Payer: Self-pay | Admitting: Pulmonary Disease

## 2012-02-08 NOTE — Progress Notes (Signed)
Subjective:    Patient ID: Peter Nicholson, male    DOB: 07-23-1940, 71 y.o.   MRN: [id number]  HPI 71 y/o WM here for a follow up visit... he has multiple medical problems as noted below...   ~  Feb10:  he is a severe vasculopath w/ vertebral art angioplasty by IR x2, and known basilar art occlusion... he remains on COUMADIN w/ protimes done by DrReynolds, Neuro...  also had left CAE in 2000 by [facility]... he denies any acute problems & feels that he has been doing well... weight = 216#, no change over the last 6months... since his brief hosp in [place] for the fecal impaction- he states "I have done some research & found a lady who does enema therapy"... ~  Dec10:  since he was here last he's had f/u [facility] 3/10 w/ CDopplers that were unchanged & no further VVS follow up deemed necessary per [facility]... he's also had 2 Q76mo MRI's of Head & Neck by DrDeveshwar w/ bilat carotid siphon region atherosclerosis & narrowing (unchanged), and markedly diseased distal left vertebral & prox basilar art narrowing & irregularity (no change)... they have recommended stopping the Coumadin & he is now on ASA & Plavix. ~  Oct11:  he's had a good 10 mo he says> active in yard, heats w/ wood & does his own wood splitting... unfortunately he hasn't lost any weight, & not really on diet... he stopped his Crestor "after talking to many people" and he stopped his Actos as well> f/u fasting labs shows LDL up to 149, and A1c up to 8.4.Marland KitchenMarland Kitchen we discussed options and he will decide what he wants to do about these serious medical problems (he decided to restart the Crestor but refuses the Actos)... due for f/u eval by DrDeveshwar/ IR to check the status of his posterior circ problems...  ~  September 30, 2010:  14mo ROV & he presents for f/u mult medical problems> He stiil cuts his own wood & mows 3 yards for exercise; not on much of a diet by his admission "I just don't eat as much, I feel great & cut back on sweets".    Known severe  cerebrovasc dis w/ prev left CAE 2000 by [facility] & two vertebral art angioplasties by DrDeveshwar for IR in 2001 & 2002; he has been stable w/o cerebral ischemic symptoms on ASA & Plavix since then...     Hx hypercholesterolemia & he had stopped his Crestor on his own last fall> f/u labs showed LDL 149 & he decided to restart the Crestor 20mg /d & has been stable on this- due for f/u FLP ==> TChol 106, TG 95, HDL 41, LDL 46    Hx DM on Metformin & Glyburide which was increased at his last OV; he had stopped Actos & refused to restart; states BS at home all ~130-140 range & he is due for A1c check= 11.2 (?what happened?); offered max oral meds vs Levemir start...    He notes loose stools (on Miralax & Colase due to hx severe constip in past); discussed Metformin & rec that he stop the Miralax & Colase for now...    He notes LBP & has been seeing his chiropractor for adjustments for many months now; known degen spondylosis from Neurosurg eval DrRoy in 2007 & he requests referral for further eval & shots...  ~  May 15, 2011:  59mo ROV & post hosp check> He was [facility] 11/26 - 03/27/11 after fall from ladder cleaning gutters w/ complex  right tibial plat fx- ORIF, 2 operations by DrHardy, & hosp course complic by ileus, TIA, electrolyte abn, etc;  Event disch to Rehab & he was attended by [facility];  Mult med changes & adjustments ==> we had a difficult time w/ med reconciliation but have the current list now up to date & accurate...    When last seen 6/12 his DM control was poor & A1c=11.2 on Metform1000Bid+Glybur10Bid; he was offered insulin vs Endocrine consult but he opted for max oral meds & Actos30+Onglyza5 added; he had no f/u visits prior to his 11/12 [facility] for fractured leg> in hosp BSs ~150-200+ and A1c=7.2; covered w/ SSI==> to nursing home rehab; we don't have rehab notes but DrGates disch pt on same prev & 1/13 f/u w/ TP showed BS~230 & A1c=7.1; we discussed better diet, get wt down, & same 4 meds  for now...    He had a TIA while off his ASA/ Plavix in the perioperative period; he was placed on Lovenox & continued this in the NH but his Plavix has not yet been restarted; pt instructed to restart the Plavix & continue his ASA (dual med converage w/ his severe vasc dis)...    Also had UTI after disch & saw TP w/ culture +Enterococcus, sens Levaquin, treated & symptoms resolved;  Also noted to have renal insuffic w/ BUN=43, Creat=2.3, K=5.4;  Lisinopril/ Lasix/ KCl- all stopped & f/u improved...    Ortho f/u DrHardy due soon; he is still non-weight bearing since his 2 operations (1st- external fixation, 2nd- ORIF) but anticipating start of phys therapy soon...    He has persistent pain in right hip area- referred from back & prev responded to shots from DrElsner/ [place]; he will call for follow up...    Wife feels that he is depressed & wants him on medication; he laughs & does not agree (thinks he is more anxious) so we settled on Zoloft 50mg /d as trial...    Note: spent w/ pt/ wife/ reviewing Hosp records/ subseq labs/ med reconciliation/ & counseling...   ~  June 29, 2011:  6wk ROV & he has some persist leg discomfort & due to f/u w/ DrHandy soon; he's noted some mild swelling & we reviewed no salt, elevation, TEDs;  We also reviewed his medical problems, medications, XRays, & LABS today> see prob list below>>  LABS 3/13:  FLP- at goals on Cres10;  Chems- all wnl & A1c=6.6 on ; CBC- Hg=11.5;  TSH=3.51;  VitD= 39;  PSA=2.75  ~  September 29, 2011:  43mo ROV & Brach called [place] in the interval since his last visit w/ some hypoglycemia- BS down to 40's at night & he decreased his Metformin & Glyburide from 2Bid to 1Bid; he was also drinking Boost up to 3 cans daily; since the adjustments his sugars have normalized w/ FBS in the 70-90 range & no hypoglycemia but still doing the Boost supplements Qhs; his weight is down 3# to 191# & he is asked to stop the Boost for now & decr the Glyburide to 5mg   Qam only- labs pending today for further adjustments...    We reviewed prob list, meds, xrays and labs> see below >> LABS 6/18:  Chems- BS=190 A1c=7.4 and we decided to decr the Glyburide to 5mg Qam only, but keep the Metform500-2Bid & same Actos & Onglyza...  ~  January 18, 2012:  59mo ROV & unfortunately Gillian has gained 16# up to 206# & we reviewed low carb, low fat diet &  exercise program needed to lose the weight;  BS is up to 171 & A1c up to 7.7;  We reiterated his important regular med regimen w/ Metform500-2Bid, Glyburide5Qam, Actos30, Onglyza5...  His CC today is incr reflux- w/ dysphagia & food sticking in mid-esoph that occurs every 2-3days & has to vomit to feel better "I quit eating at the cafe across the street"; he had stopped his PPI- Protonix on his own (?why?), PMHx indicates hx 4cmHH, erosive esoph in 2009 & stricture dilated in 2003; rec to restart the Protonix40 & we will refer to GI for EGD & dilatation...    We reviewed prob list, meds, xrays and labs> see below for updates >> OK Flu shot today. LABS 10/13:  Chems- ok x BS=171 A1c=7.7;  CBC- Hg=12.6.Marland KitchenMarland Kitchen    Problem List:     OBSTRUCTIVE SLEEP APNEA (ICD-327.23) - s/p split night sleep study 11/08- showing an AHI of 19, assoc w/ an O2 sat down to 78%... during the CPAP trial he titrated up to a pressure of 11cm but never ret to REM sleep so optimal pressure is still ???...he uses [facility] for his CPAP needs... ~  12/10: he tells me that he has stopped the CPAP, that he sleeps better without it, not snoring/ rests well, & wakes feeling refreshed... he refuses Sleep Med re-eval etc...  Hx of HYPERTENSION - not currently on medications...  ~  6/12: BP= 136/80 off meds & denies HA, visual changes, CP, palipit, dizziness, syncope, dyspnea, edema, etc... he states that home BP checks are "OK". ~  1/13: he was sent home from the NH/ Rehab on Lisinopril & Lasix; both stopped 1/13 by TP w/ renal insuffic & elev K+; subseq  improved & BP stable... ~  2/13: BP= 112/68 & feeling weak etc...  3/13:  BP= 140/72 & feeling some better... ~  6/13:  BP= 138/78 & feeling much better; denies CP, palpit, ch in SOB or edema... ~  10/13:  BP= 140/82 & he is c/o reflux & dysphagia today, no CP/ palpit/ SOB/ edema...  CEREBROVASCULAR DISEASE (ICD-437.9) - stable on ASA 325mg /d & PLAVIX 75mg /d per DrDeveshwar/ DrLawson/ DrReynolds... he has severe extracranial cerebrovasc disease- esp in the post circulation, and he is s/p vertebral art angioplasty x 2, and w/ a known basilar art occlusion- prev on Coumadin controlled by Neurology (switched to ASA/ Plavix in 2010)... also followed by Windell Moulding and DrDeveshwar for IR... he had a left CAE in 6/00 by [facility]... ~  CDoppler 2/07 showed 0-39% bilat ICA stenoses, s/p left CAE w/ DPA... ~  eval 3/10 by Windell Moulding w/ CDopplers that showed similar patency w/ 0-39% bilat ICA stenoses; due to the 10 yr interval DrLawson did not feel he needed any additional follow up. ~  12/10: continued f/u DrDevershwar IR w/ MRI/ MRAs showing bilat carotid siphon region atherosclerosis & narrowing (unchanged), and markedly diseased distal left vertebral & prox basilar art narrowing (no change)... ~  11/12: CTA head & neck showed similar severe dis w/ mod to severe bilat cavernous ICA stenoses due to extensive atherosclerosis; severe distal vertebral atherosclerosis- distal right occluded, distal left w/ high grade stenosis; mod to severe bilat PCA stenoses; chr cerebellar infarcts, no acute infarcts.  VENOUS INSUFFICIENCY (ICD-459.81) - he knows to avoid sodium, elevate legs, wear support hose...  HYPERCHOLESTEROLEMIA (ICD-272.0) - on CRESTOR 10mg /d since Hosp 12/12... ~  FLP 6/08 showed TChol 153, TG 120, HDL 36, LDL 93 ~  FLP 8/09 showed TChol 131,  TG 84, HDL 38, LDL 76 ~  2010:  pt was not fasting for blood work... rec to continue Crestor10 + diet efforts. ~  FLP 10/11 showed TChol 210, TG 161, HDL 40, LDL  149... rec to start Crestor 20mg /d, he will decide. ~  FLP 6/12 on Cres20 showed TChol 106, TG 95, HDL 41, LDL 46... Continue same. ~  FLP 12/12 in Hosp (on Cres20 PTA) showed TChol 100, TG 101, HDL 37, LDL 43... They decr to Cres10. ~  FLP 3/13 on Cres10 showed TChol 118, TG 67, HDL 44, LDL 61... Continue same.  DIABETES MELLITUS (ICD-250.00) - on METFORMIN 500mg - 2Bid, GLYBURIDE 5mg Bid, ACTOS 30mg /d, & ONGLYZA 5mg /d... ~  labs 6/08 showed BS= 155, HgA1c= 8.1.Marland Kitchen. (glyburide incr to 5Bid). ~  labs 11/08 showed BS= 352, HgA1c= 8.1.... (Actos added). ~  labs 8/09 showed BS= 128, HgA1c= 7.1.Marland KitchenMarland Kitchen rec- same meds, better diet, get wt down! ~  labs 2/10 showed BS= 249, A1c= 7.3.... Metform incr to 2Bid. ~  labs 12/10 (nonfasting- on all 3 meds) showed BS= 87, A1c= 6.9 ~  labs 10/11 on Metform1000Bid+Gybur5Bid showed BS= 180, A1c= 8.4.Marland KitchenMarland Kitchen rec to incr GLYBUR10Bid + diet. ~  labs 6/12 on Metform1000Bid+Glybur10Bid showed BS= 196, A1c= 11.2 ?what happened?> he declined insulin & Endocrine consult, opted for max oral meds by adding ACTOS 30mg /d & ONGLYZA 5mg /d... ~  Labs 12/12 in [place] showed BS 150-200+ and A1c= 7.2; covered w/ SSI in hosp & placed back on 4 oral meds on disch from rehab... ~  Labs 1/13 on all 4 meds showed BS= 230, A1c=7.1; we reviewed diet & he will start PT soon... ~  Labs 3/13 on showed BS= 72, A1c= 6.6 ~  4/13: pt called w/ hypoglycemia & we decreased Glyburide 5mg  from 2Bid to 1Bid... ~  6/13:  Pt on Metform500-2Bid, Glybur5mg Bid, Actos30, Onglyza5; BS at home 70-90 he says & still drinking Boost; Labs showed BS=190  A1c=7.4  ; we decided to cut the Glyburide5mg  Qam only but keep the Metform500-2Bid +Actos +Onglyza... ~  10/13: on his 25meds> BS=171, A1c=7.7 but he's gained 16#; told to STOP the boost, get on diet, get wt down, take all meds every day...  OVERWEIGHT (ICD-278.02) - he states that he is not on much of a diet & we reviewed low carb/ no sweets/ low fat diet. ~  weight  8/09 = 216# ~  weight 2/10 = 216# ~  weight 12/10 = 212# ~  weight 10/11 = 210# ~  Weight 6/12 = 206# and he admits to not being on much of a diet... ~  Weight 2/13 = non weight bearing still... ~  Weight 3/13 = 193# ~  Weight 6/13 = 191# ~  Weight 10/13 = 206#  GERD (ICD-530.81) - prev on Nexium40, he switched to OTC Prilosec, now PROTONIX 40mg /d due to Plavix Rx; pt had stopped this on his own & asked to restart 10/13. ~  EGD 19/03 showed 4cmHH, GERD & esoph stricture dilated... ~  EGD 12/09 by DrPatterson showed HH & severe erosive esophagitis from GERD... ~  10/13: presents w/ incr reflux- w/ dysphagia & food sticking in mid-esoph that occurs every 2-3days & has to vomit to feel better; he had stopped his PPI on his own (?why?), rec to restart the Protonix40 & we will refer to GI for EGD & dilatation...  DIVERTICULOSIS OF COLON (ICD-562.10) - note: he was hosp in [place] briefly 12/09 for fecal impaction; prev on Miralax & Colase  but recently noted loose stools on the Metformin 1000mg Bid... ~  colonoscopy 10/03 by DrPatterson showed divertics only... ~  colonoscopy 12/09 by DrPatterson was also normal x for divertics...  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 Hx of RENAL CELL CANCER (ICD-189.0) - he had a partial nephrectomy of the right side of his horseshoe kidney in 1994 due to renal cell ca... still followed by DrPeterson yearly by his hx but we don't have any recent notes... ~  labs 10/11 showed PSA= 0.95 ~  Labs 3/13 showed BUN=21, Creat=1.1, PSA=2.75 ~  Labs 10/13 showed BUN= 25, Creat= 1.2  Hx of NEPHROLITHIASIS (ICD-592.0) - remote hx of kidney stones followed by DrPeterson...  DEGENERATIVE JOINT DISEASE (ICD-715.90) - and elbow tendonitis Rx'd OTC Prn...  GOUT (ICD-274.9) - on ALLOPURINOL 300mg /d... ~  labs 10/11 showed URIC= 4.7  BACK PAIN, LUMBAR (ICD-724.2) - known degenerative spondylitic disease at L4-5 and L5-S1  prev followed by [facility] (now DrElsner & DrHarkins)... ~  6/12: he reports further back pain w/ eval by Chiropractor showing L4 disc prob & regular adjustments; but now notes incr pain & he requests referral to Neurosurg for further eval ==> seen by DrElsner, then DrHarkins for shots which has helped...  ANXIETY (ICD-300.00) - uses ALPRAZOLAM 0.5mg  as needed.   Past Surgical History  Procedure Date  . Incisional hernia repair 1/96    Dr.Leone  . Nasal sinus surgery 12/96    Dr.Redman  . Cholecystectomy 9/04    Dr. Maryagnes Amos  . Partial nephrectomy 1994    renal cell cancer in horseshoe kidney  . Carotid endarterectomy 09/2000    Dr.Lawson  . Veterbral art angioplasty     x2  . Small intestine surgery   . External fixation leg 03/10/2011    Procedure: EXTERNAL FIXATION LEG;  Surgeon: Budd Palmer;  Location: [facility] OR;  Service: Orthopedics;  Laterality: Right;  . Orif tibia plateau 03/24/2011    Procedure: OPEN REDUCTION INTERNAL FIXATION (ORIF) TIBIAL PLATEAU;  Surgeon: Budd Palmer;  Location: [facility] OR;  Service: Orthopedics;  Laterality: Right;    Outpatient Encounter Prescriptions as of 01/18/2012  Medication Sig Dispense Refill  . allopurinol (ZYLOPRIM) 300 MG tablet Take 1 tablet (300 mg total) by mouth daily.  90 tablet  3  . ALPRAZolam (XANAX) 0.5 MG tablet TAKE ONE-HALF TO ONE TABLET BY MOUTH THREE TIMES DAILY AS NEEDED FOR  NERVES  90 tablet  5  . aspirin 325 MG EC tablet Take 325 mg by mouth daily.        Marland Kitchen b complex vitamins tablet Take 1 tablet by mouth daily.      . cholecalciferol (VITAMIN D) 1000 UNITS tablet Take 1,000 Units by mouth daily.      . clopidogrel (PLAVIX) 75 MG tablet Take 1 tablet (75 mg total) by mouth daily.  90 tablet  3  . glyBURIDE (DIABETA) 5 MG tablet Take 1 tablet (5 mg total) by mouth daily with breakfast.  90 tablet  3  . metFORMIN (GLUCOPHAGE) 500 MG tablet Take 2 tablets by mouth two times daily  360 tablet  3  . Multiple Vitamin  (MULTIVITAMIN) tablet Take 1 tablet by mouth daily.        . pioglitazone (ACTOS) 30 MG tablet Take 1 tablet (30 mg total) by mouth daily.  90 tablet  3  . rosuvastatin (CRESTOR) 10 MG tablet Take 1 tablet (10 mg total) by mouth  daily.  90 tablet  3  . saxagliptin HCl (ONGLYZA) 5 MG TABS tablet Take 1 tablet (5 mg total) by mouth daily.  90 tablet  3  . DISCONTD: co-enzyme Q-10 30 MG capsule Take 30 mg by mouth 3 (three) times daily.      Marland Kitchen DISCONTD: docusate sodium (COLACE) 100 MG capsule Take 100 mg by mouth daily.        Marland Kitchen DISCONTD: ondansetron (ZOFRAN) 4 MG tablet Take 1 tablet (4 mg total) by mouth every 8 (eight) hours as needed for nausea.  30 tablet  1  . glucose blood (BAYER CONTOUR NEXT TEST) test strip Use as instructed  100 each  11  . DISCONTD: HYDROcodone-acetaminophen (NORCO) 5-325 MG per tablet Take 1 tablet by mouth every 6 (six) hours as needed.        Marland Kitchen DISCONTD: pantoprazole (PROTONIX) 40 MG tablet Take 1 tablet (40 mg total) by mouth daily at 12 noon.  90 tablet  3  . DISCONTD: pantoprazole (PROTONIX) 40 MG tablet Take 1 tablet (40 mg total) by mouth daily at 12 noon.  90 tablet  3    Allergies  Allergen Reactions  . Hydromorphone Hcl     REACTION: hallucinations  . Promethazine Hcl     REACTION: hallucinations    Current Medications, Allergies, Past Medical History, Past Surgical History, Family History, and Social History were reviewed in [employer] record.    Review of Systems        See HPI - all other systems neg except as noted...  The patient complains of dyspnea on exertion.  The patient denies anorexia, fever, weight loss, weight gain, vision loss, decreased hearing, hoarseness, chest pain, syncope, peripheral edema, prolonged cough, headaches, hemoptysis, abdominal pain, melena, hematochezia, severe indigestion/heartburn, hematuria, incontinence, muscle weakness, suspicious skin lesions, transient blindness, difficulty walking,  depression, unusual weight change, abnormal bleeding, enlarged lymph nodes, and angioedema.     Objective:   Physical Exam     WD, WN, 71 y/o WM in NAD...  GENERAL:  Alert & oriented; pleasant & cooperative... HEENT:  [place]/AT, EOM-wnl, PERRLA, EACs-clear, TMs-wnl, NOSE-clear, THROAT-clear & wnl. NECK:  Supple w/ fairROM; no JVD; s/p left CAE, +bruits; no thyromegaly or nodules palpated; no lymphadenopathy. CHEST:  Clear to P & A; without wheezes/ rales/ or rhonchi heard... HEART:  Regular Rhythm; gr 1/6 SEM without rubs or gallops detected... ABDOMEN:  Soft & nontender; normal bowel sounds; no organomegaly or masses palpated... EXT: right knee deformity s/p ORIF, mod arthritic changes; no varicose veins/ +venous insuffic/ 1+ edema. NEURO:  CN's intact; no focal neuro deficits... DERM:  No lesions noted; no rash etc...  RADIOLOGY DATA:  Reviewed in the EPIC EMR & discussed w/ the patient...    >>CXR 11/12 showed borderline heart size, tortuous aorta, clear & NAD...    >>EKG 12/12 showed NSR, rate92, NSSTTWA...  LABORATORY DATA:  Reviewed in the EPIC EMR & discussed w/ the patient...   Assessment & Plan:    GI>  GERD prev controlled w/ Protonix but he stopped on his own (?why?); now w/ signif reflux, dysphagia & needs to restart the Protonix & refer to GI of EGD, dilatation...  DM>  With his BS=171 & A1c up to 7.7 we decided to keep the glyburide at 5mg Qam (to avoid nocturnal hypoglycemia), and the Metformin500-2Bid, Actos30 & Ongly5; reminded to take all every day, get on diet, get wt down, stop the boost.   CEREBROVASC Disease & TIA  12/12>  Known severe cerebrovasc dis & he had TIA while in hosp for fx leg (he was off plavix at the time); treated w/ Lovenox perioperatively but he hasn't yet restarted the Plavix; pt rec to take ASA/ Plavix daily & he has f/u w/ Neuro scheduled...  He is w/o cerebral ischemic symptoms, s/p left CAE in 2000 & 2 vertebral art angioplasties in 2001 &  2002...  CHOL>  on Crestor 10mg  /d now & FLP looks great!  Overweight>  We discussed low carb, low fat wt reducing diet...  Hx Renal Cell Ca in right side of horseshoe kidney> removed in 1994 & follwed regularly by DrPeterson...  DJD/ Hx Gout/ LBP>  He was seeing chiropractor regularly regarding LBP & then f/u w/ DrElsner,Neurosurg; he rec ESI by DrHarkins & the shots have helped...  s/p fall w/ right Tibial plateaux fx> 1st ext fixation, 2nd ORIF>  By DrHardy, Ortho; pt still non weight bearing & hoping to start rehab soon...  Anxiety>  He remains on Alpraz Prn...   Patient's Medications  New Prescriptions   ESOMEPRAZOLE (NEXIUM) 40 MG CAPSULE    Take 1 capsule twice daily  Previous Medications   ALLOPURINOL (ZYLOPRIM) 300 MG TABLET    Take 1 tablet (300 mg total) by mouth daily.   ALPRAZOLAM (XANAX) 0.5 MG TABLET    TAKE ONE-HALF TO ONE TABLET BY MOUTH THREE TIMES DAILY AS NEEDED FOR  NERVES   ASPIRIN 325 MG EC TABLET    Take 325 mg by mouth daily.     B COMPLEX VITAMINS TABLET    Take 1 tablet by mouth daily.   CHOLECALCIFEROL (VITAMIN D) 1000 UNITS TABLET    Take 1,000 Units by mouth daily.   CLOPIDOGREL (PLAVIX) 75 MG TABLET    Take 1 tablet (75 mg total) by mouth daily.   GLUCOSE BLOOD (BAYER CONTOUR NEXT TEST) TEST STRIP    Use as instructed   GLYBURIDE (DIABETA) 5 MG TABLET    Take 1 tablet (5 mg total) by mouth daily with breakfast.   METFORMIN (GLUCOPHAGE) 500 MG TABLET    Take 2 tablets by mouth two times daily   MULTIPLE VITAMIN (MULTIVITAMIN) TABLET    Take 1 tablet by mouth daily.     PIOGLITAZONE (ACTOS) 30 MG TABLET    Take 1 tablet (30 mg total) by mouth daily.   ROSUVASTATIN (CRESTOR) 10 MG TABLET    Take 1 tablet (10 mg total) by mouth daily.   SAXAGLIPTIN HCL (ONGLYZA) 5 MG TABS TABLET    Take 1 tablet (5 mg total) by mouth daily.  Modified Medications   No medications on file  Discontinued Medications   CO-ENZYME Q-10 30 MG CAPSULE    Take 30 mg by mouth 3  (three) times daily.   DOCUSATE SODIUM (COLACE) 100 MG CAPSULE    Take 100 mg by mouth daily.     HYDROCODONE-ACETAMINOPHEN (NORCO) 5-325 MG PER TABLET    Take 1 tablet by mouth every 6 (six) hours as needed.     ONDANSETRON (ZOFRAN) 4 MG TABLET    Take 1 tablet (4 mg total) by mouth every 8 (eight) hours as needed for nausea.   PANTOPRAZOLE (PROTONIX) 40 MG TABLET    Take 1 tablet (40 mg total) by mouth daily at 12 noon.   PANTOPRAZOLE (PROTONIX) 40 MG TABLET    Take 1 tablet (40 mg total) by mouth daily at 12 noon.

## 2012-02-09 ENCOUNTER — Ambulatory Visit (INDEPENDENT_AMBULATORY_CARE_PROVIDER_SITE_OTHER): Payer: Medicare Other | Admitting: Gastroenterology

## 2012-02-09 ENCOUNTER — Encounter: Payer: Self-pay | Admitting: Gastroenterology

## 2012-02-09 VITALS — BP 150/74 | HR 76 | Ht 64.5 in | Wt 207.8 lb

## 2012-02-09 DIAGNOSIS — Z8673 Personal history of transient ischemic attack (TIA), and cerebral infarction without residual deficits: Secondary | ICD-10-CM

## 2012-02-09 DIAGNOSIS — E119 Type 2 diabetes mellitus without complications: Secondary | ICD-10-CM

## 2012-02-09 DIAGNOSIS — R1314 Dysphagia, pharyngoesophageal phase: Secondary | ICD-10-CM

## 2012-02-09 DIAGNOSIS — R1310 Dysphagia, unspecified: Secondary | ICD-10-CM

## 2012-02-09 DIAGNOSIS — Z7901 Long term (current) use of anticoagulants: Secondary | ICD-10-CM

## 2012-02-09 DIAGNOSIS — K219 Gastro-esophageal reflux disease without esophagitis: Secondary | ICD-10-CM

## 2012-02-09 NOTE — Patient Instructions (Addendum)
You have been scheduled for an endoscopy  propofol. Please follow the written instructions given to you at your visit today. If you use inhalers (even only as needed) or a CPAP machine, please bring them with you on the day of your procedure.  Continue taking your Plavix and Aspirin for your Endoscopy.  Continue taking Protonix.

## 2012-02-09 NOTE — Progress Notes (Signed)
This is a somewhat complex 71 year old Caucasian male with chronic GERD. He recently has had some intermittent solid food dysphagia partially relieved by reinstitution of Protonix 40 mg a day. He had endoscopy several years ago with erosive esophagitis, was placed on daily PPI therapy, but over the years has discontinued this medication with resultant typical GERD and intermittent solid food dysphagia. He is chronically on aspirin and Plavix because of previous CVAs. Patient also has adult onset diabetes managed with oral medications. He denies current cardiovascular, neurovascular hepatobiliary or systemic complaints. He is status post cholecystectomy. Apparently the patient also has had several exploratory laparotomies related to complications from nephrectomy for renal cell carcinoma. He currently is having regular bowel movements and denies melena or hematochezia.  Current Medications, Allergies, Past Medical History, Past Surgical History, Family History and Social History were reviewed in [employer] record.  Pertinent Review of Systems Negative   Physical Exam: Healthy-appearing patient in no acute distress. Blood pressure 150/74, pulse 76 and weight 207 pounds with BMI of 35.12. I cannot appreciate stigmata of chronic liver disease. Chest is clear and he is in a regular rhythm without murmurs gallops or rubs. His abdomen shows no organomegaly, masses or tenderness. There is no peripheral edema or phlebitis. Mental status is normal.    Assessment and Plan: Chronic GERD with probable peptic stricture the distal esophagus. I've scheduled this patient for endoscopy and probable dilatation, but will not discontinue his Plavix or aspirin. He appears to be well compensated in terms of any cardiovascular or pulmonary issues at this time. She is to continue all his other medications as listed and reviewed, and I have renewed his daily Protonix. We'll make adjustments in his oral  diabetic medications for his procedure. Please copy Dr. Alroy Dust. Encounter Diagnosis  Name Primary?  . GERD (gastroesophageal reflux disease) Yes

## 2012-02-15 ENCOUNTER — Encounter: Payer: Self-pay | Admitting: Gastroenterology

## 2012-02-15 ENCOUNTER — Ambulatory Visit (AMBULATORY_SURGERY_CENTER): Payer: Medicare Other | Admitting: Gastroenterology

## 2012-02-15 VITALS — BP 158/74 | HR 84 | Temp 98.0°F | Resp 18 | Ht 64.5 in | Wt 202.0 lb

## 2012-02-15 DIAGNOSIS — K219 Gastro-esophageal reflux disease without esophagitis: Secondary | ICD-10-CM

## 2012-02-15 DIAGNOSIS — R1310 Dysphagia, unspecified: Secondary | ICD-10-CM

## 2012-02-15 DIAGNOSIS — D62 Acute posthemorrhagic anemia: Secondary | ICD-10-CM

## 2012-02-15 DIAGNOSIS — K2900 Acute gastritis without bleeding: Secondary | ICD-10-CM

## 2012-02-15 DIAGNOSIS — K222 Esophageal obstruction: Secondary | ICD-10-CM

## 2012-02-15 LAB — GLUCOSE, CAPILLARY: Glucose-Capillary: 121 mg/dL — ABNORMAL HIGH (ref 70–99)

## 2012-02-15 MED ORDER — SODIUM CHLORIDE 0.9 % IV SOLN: 500.0000 mL | INTRAVENOUS | Status: DC

## 2012-02-15 NOTE — Op Note (Signed)
[facility] [address] [place], [address]   ENDOSCOPY PROCEDURE REPORT  PATIENT: Peter, Nicholson  MR#: [id number] BIRTHDATE: 11-Sep-1940 , 71  yrs. old GENDER: Male ENDOSCOPIST:Dorrien Grunder Hale Bogus, MD, Clementeen Graham REFERRED BY: Alroy Dust, [name] PROCEDURE DATE:  02/15/2012 PROCEDURE:   EGD w/ biopsy for H.pylori and Maloney dilation of esophagus ASA CLASS:    Class III INDICATIONS: dysphagia and history of esophageal reflux. MEDICATION: propofol (Diprivan) 300mg  IV TOPICAL ANESTHETIC:  DESCRIPTION OF PROCEDURE:   After the risks and benefits of the procedure were explained, informed consent was obtained.  The LB GIF-H180 [id number]  endoscope was introduced through the mouth  and advanced to the second portion of the duodenum .  The instrument was slowly withdrawn as the mucosa was fully examined.    The upper, middle and distal third of the esophagus were carefully inspected and no abnormalities were noted.  The z-line was well seen at the GEJ.  The endoscope was pushed into the fundus which was normal including a retroflexed view.  The antrum, gastric body, first and second part of the duodenum were unremarkable. Stricture at GE junction dilated #48 F Maloney dilator. Retroflexed views revealed a hiatal hernia and Retroflexed views revealed 5 cm.  HH noted.    The scope was then withdrawn from the patient and the procedure completed.Repeat EGD shows heme but no tear !!!  COMPLICATIONS: There were no complications.   ENDOSCOPIC IMPRESSION: 1.   Normal EGD.Marland KitchenHH and chronic GERD 2.   Stricture at GE junction dilated #48 F Maloney dilator. 3.    erosive diffuse gastritis noted and CLO Bx.  done.  RECOMMENDATIONS: 1.  await pathology results 2.  anti-reflux regimen to be follow 3.  continue PPI 4.  dilatations PRN 5.  Clear liquids until  , then soft foods rest iof day.  Resume prior diet tomorrow.    _______________________________ eSignedMardella Layman, MD, [facility] 02/15/2012 8:53 AM

## 2012-02-15 NOTE — Progress Notes (Signed)
Patient did not experience any of the following events: a burn prior to discharge; a fall within the facility; wrong site/side/patient/procedure/implant event; or a hospital transfer or hospital admission upon discharge from the facility. (G8907) Patient did not have preoperative order for IV antibiotic SSI prophylaxis. (G8918)  

## 2012-02-15 NOTE — Progress Notes (Signed)
Last dose of Plavix 02-14-12

## 2012-02-15 NOTE — Patient Instructions (Addendum)
YOU HAD AN ENDOSCOPIC PROCEDURE TODAY AT THE [facility]: Refer to the procedure report that was given to you for any specific questions about what was found during the examination.  If the procedure report does not answer your questions, please call your gastroenterologist to clarify.  If you requested that your care partner not be given the details of your procedure findings, then the procedure report has been included in a sealed envelope for you to review at your convenience later.  YOU SHOULD EXPECT: Some feelings of bloating in the abdomen. Passage of more gas than usual.  Walking can help get rid of the air that was put into your GI tract during the procedure and reduce the bloating. If you had a lower endoscopy (such as a colonoscopy or flexible sigmoidoscopy) you may notice spotting of blood in your stool or on the toilet paper. If you underwent a bowel prep for your procedure, then you may not have a normal bowel movement for a few days.  DIET: dilation diet today. Resume regular diet tomorrow.  Drink plenty of fluids but you should avoid alcoholic beverages for 24 hours.  ACTIVITY: Your care partner should take you home directly after the procedure.  You should plan to take it easy, moving slowly for the rest of the day.  You can resume normal activity the day after the procedure however you should NOT DRIVE or use heavy machinery for 24 hours (because of the sedation medicines used during the test).    SYMPTOMS TO REPORT IMMEDIATELY: A gastroenterologist can be reached at any hour.  During normal business hours, 8:30 AM to 5:00 PM Monday through Friday, call [phone].  After hours and on weekends, please call the GI answering service at [phone] who will take a message and have the physician on call contact you.    Following upper endoscopy (EGD)  Vomiting of blood or coffee ground material  New chest pain or pain under the shoulder blades  Painful or persistently  difficult swallowing  New shortness of breath  Fever of 100F or higher  Black, tarry-looking stools  FOLLOW UP: If any biopsies were taken you will be contacted by phone or by letter within the next 1-3 weeks.  Call your gastroenterologist if you have not heard about the biopsies in 3 weeks.  Our staff will call the home number listed on your records the next business day following your procedure to check on you and address any questions or concerns that you may have at that time regarding the information given to you following your procedure. This is a courtesy call and so if there is no answer at the home number and we have not heard from you through the emergency physician on call, we will assume that you have returned to your regular daily activities without incident.  SIGNATURES/CONFIDENTIALITY: You and/or your care partner have signed paperwork which will be entered into your electronic medical record.  These signatures attest to the fact that that the information above on your After Visit Summary has been reviewed and is understood.  Full responsibility of the confidentiality of this discharge information lies with you and/or your care-partner.

## 2012-02-15 NOTE — Progress Notes (Signed)
Rechecked patient between each dilatation per endoscope, Dr. Jarold Motto.

## 2012-02-16 ENCOUNTER — Telehealth: Payer: Self-pay | Admitting: *Deleted

## 2012-02-16 ENCOUNTER — Telehealth: Payer: Self-pay | Admitting: Gastroenterology

## 2012-02-16 ENCOUNTER — Telehealth: Payer: Self-pay

## 2012-02-16 LAB — HELICOBACTER PYLORI SCREEN-BIOPSY: UREASE: NEGATIVE

## 2012-02-16 NOTE — Telephone Encounter (Signed)
Pt has seen black stools today.  He had biopsies and a dilatation yesterday.  May be related to this digested blood.  Advised pt to continue to monitor closely.  Denied symptoms such as weakness, light-headedness, obvious blood, diaphoresis.  Able to eat and perform normal activities.  Advised pt to call for new or worsening symptoms.

## 2012-02-16 NOTE — Telephone Encounter (Signed)
  Follow up Call-  Call back number 02/15/2012  Post procedure Call Back phone  # [phone]  Permission to leave phone message Yes     Patient questions:  Person who answered the phone stated that it was the wrong number.

## 2012-02-17 ENCOUNTER — Encounter: Payer: Self-pay | Admitting: Gastroenterology

## 2012-02-18 ENCOUNTER — Telehealth: Payer: Self-pay | Admitting: Pulmonary Disease

## 2012-02-18 MED ORDER — PIOGLITAZONE HCL 30 MG PO TABS: 30.0000 mg | ORAL_TABLET | Freq: Every day | ORAL | Status: DC

## 2012-02-18 NOTE — Telephone Encounter (Signed)
Clarified with pt that he wanted a 90day supply of Actos and confirmed that refill was sent to [facility] in [place].

## 2012-02-24 NOTE — Telephone Encounter (Signed)
See other note from 115/13

## 2012-03-22 ENCOUNTER — Telehealth: Payer: Self-pay | Admitting: Pulmonary Disease

## 2012-03-22 MED ORDER — SIMVASTATIN 40 MG PO TABS: 40.0000 mg | ORAL_TABLET | Freq: Every day | ORAL | Status: DC

## 2012-03-22 NOTE — Telephone Encounter (Signed)
Per SN---ok to change to simvastatin 40 mg  1 po qhs.   Called and spoke with pt and he is aware and requested that #90 be sent to his pharmacy. This rx has been sent in and nothing further is needed.

## 2012-03-22 NOTE — Telephone Encounter (Signed)
Pt is needing a cheaper cholesterol medication. Crestor is getting too expensive. His wife takes Simvastatin and would like to try that. Please advise. Thanks!

## 2012-04-19 ENCOUNTER — Telehealth: Payer: Self-pay | Admitting: Pulmonary Disease

## 2012-04-19 NOTE — Telephone Encounter (Signed)
I spoke with the pt wife and she is concerned about the pt having some memory issues lately. She states he is forgetting everyday things, for example he took their grandson to school yesterday but then could not remember how to get back home. I advised her to set an appt to discuss this further with MD. Pt set to come in on Monday with TP. Carron Curie, CMA

## 2012-04-25 ENCOUNTER — Other Ambulatory Visit (INDEPENDENT_AMBULATORY_CARE_PROVIDER_SITE_OTHER): Payer: Medicare Other

## 2012-04-25 ENCOUNTER — Encounter: Payer: Self-pay | Admitting: Adult Health

## 2012-04-25 ENCOUNTER — Ambulatory Visit (INDEPENDENT_AMBULATORY_CARE_PROVIDER_SITE_OTHER): Payer: Medicare Other | Admitting: Adult Health

## 2012-04-25 VITALS — BP 150/84 | HR 74 | Temp 97.8°F | Ht 66.0 in | Wt 207.0 lb

## 2012-04-25 DIAGNOSIS — I10 Essential (primary) hypertension: Secondary | ICD-10-CM

## 2012-04-25 DIAGNOSIS — E119 Type 2 diabetes mellitus without complications: Secondary | ICD-10-CM

## 2012-04-25 DIAGNOSIS — F411 Generalized anxiety disorder: Secondary | ICD-10-CM

## 2012-04-25 DIAGNOSIS — R413 Other amnesia: Secondary | ICD-10-CM | POA: Insufficient documentation

## 2012-04-25 DIAGNOSIS — E538 Deficiency of other specified B group vitamins: Secondary | ICD-10-CM

## 2012-04-25 DIAGNOSIS — G459 Transient cerebral ischemic attack, unspecified: Secondary | ICD-10-CM

## 2012-04-25 LAB — BASIC METABOLIC PANEL
BUN: 23 mg/dL (ref 6–23)
CO2: 27 mEq/L (ref 19–32)
Calcium: 9.2 mg/dL (ref 8.4–10.5)
Creatinine, Ser: 1.3 mg/dL (ref 0.4–1.5)
GFR: 58.12 mL/min — ABNORMAL LOW (ref >60.00)
Glucose, Bld: 226 mg/dL — ABNORMAL HIGH (ref 70–99)
Sodium: 141 mEq/L (ref 135–145)

## 2012-04-25 LAB — URINALYSIS, ROUTINE W REFLEX MICROSCOPIC
Ketones, ur: NEGATIVE
Specific Gravity, Urine: 1.02 (ref 1.000–1.030)
Total Protein, Urine: 30
Urine Glucose: >=1000
pH: 6 (ref 5.0–8.0)

## 2012-04-25 NOTE — Assessment & Plan Note (Signed)
Mild memory impairment  Gradual onset w/ abn MMSE and Clock draw  Will check labs to look for other etiology  Refer to Neuro for further evaluation

## 2012-04-25 NOTE — Patient Instructions (Addendum)
I will call with lab results.  We are referring you to Neurology  follow up Dr. Kriste Basque  In 6 weeks  Please contact office for sooner follow up if symptoms do not improve or worsen or seek emergency care

## 2012-04-25 NOTE — Progress Notes (Signed)
Subjective:    Patient ID: Peter Nicholson, male    DOB: 02-Aug-1940, 72 y.o.   MRN: [id number]  HPI 72 y/o WM    ~  Feb10:  he is a severe vasculopath w/ vertebral art angioplasty by IR x2, and known basilar art occlusion... he remains on COUMADIN w/ protimes done by DrReynolds, Neuro...  also had left CAE in 2000 by [facility]... he denies any acute problems & feels that he has been doing well... weight = 216#, no change over the last 6months... since his brief hosp in [place] for the fecal impaction- he states "I have done some research & found a lady who does enema therapy"... ~  Dec10:  since he was here last he's had f/u [facility] 3/10 w/ CDopplers that were unchanged & no further VVS follow up deemed necessary per [facility]... he's also had 2 Q4mo MRI's of Head & Neck by DrDeveshwar w/ bilat carotid siphon region atherosclerosis & narrowing (unchanged), and markedly diseased distal left vertebral & prox basilar art narrowing & irregularity (no change)... they have recommended stopping the Coumadin & he is now on ASA & Plavix. ~  Oct11:  he's had a good 10 mo he says> active in yard, heats w/ wood & does his own wood splitting... unfortunately he hasn't lost any weight, & not really on diet... he stopped his Crestor "after talking to many people" and he stopped his Actos as well> f/u fasting labs shows LDL up to 149, and A1c up to 8.4.Marland KitchenMarland Kitchen we discussed options and he will decide what he wants to do about these serious medical problems (he decided to restart the Crestor but refuses the Actos)... due for f/u eval by DrDeveshwar/ IR to check the status of his posterior circ problems...  ~  September 30, 2010:  38mo ROV & he presents for f/u mult medical problems> He stiil cuts his own wood & mows 3 yards for exercise; not on much of a diet by his admission "I just don't eat as much, I feel great & cut back on sweets".    Known severe cerebrovasc dis w/ prev left CAE 2000 by [facility] & two vertebral art  angioplasties by DrDeveshwar for IR in 2001 & 2002; he has been stable w/o cerebral ischemic symptoms on ASA & Plavix since then...     Hx hypercholesterolemia & he had stopped his Crestor on his own last fall> f/u labs showed LDL 149 & he decided to restart the Crestor 20mg /d & has been stable on this- due for f/u FLP ==> TChol 106, TG 95, HDL 41, LDL 46    Hx DM on Metformin & Glyburide which was increased at his last OV; he had stopped Actos & refused to restart; states BS at home all ~130-140 range & he is due for A1c check= 11.2 (?what happened?); offered max oral meds vs Levemir start...    He notes loose stools (on Miralax & Colase due to hx severe constip in past); discussed Metformin & rec that he stop the Miralax & Colase for now...    He notes LBP & has been seeing his chiropractor for adjustments for many months now; known degen spondylosis from Neurosurg eval DrRoy in 2007 & he requests referral for further eval & shots...  ~  May 15, 2011:  88mo ROV & post hosp check> He was [facility] 11/26 - 03/27/11 after fall from ladder cleaning gutters w/ complex right tibial plat fx- ORIF, 2 operations by DrHardy, & hosp course complic  by ileus, TIA, electrolyte abn, etc;  Event disch to Rehab & he was attended by [facility];  Mult med changes & adjustments ==> we had a difficult time w/ med reconciliation but have the current list now up to date & accurate...    When last seen 6/12 his DM control was poor & A1c=11.2 on Metform1000Bid+Glybur10Bid; he was offered insulin vs Endocrine consult but he opted for max oral meds & Actos30+Onglyza5 added; he had no f/u visits prior to his 11/12 [facility] for fractured leg> in hosp BSs ~150-200+ and A1c=7.2; covered w/ SSI==> to nursing home rehab; we don't have rehab notes but DrGates disch pt on same prev & 1/13 f/u w/ TP showed BS~230 & A1c=7.1; we discussed better diet, get wt down, & same 4 meds for now...    He had a TIA while off his ASA/ Plavix in the  perioperative period; he was placed on Lovenox & continued this in the NH but his Plavix has not yet been restarted; pt instructed to restart the Plavix & continue his ASA (dual med converage w/ his severe vasc dis)...    Also had UTI after disch & saw TP w/ culture +Enterococcus, sens Levaquin, treated & symptoms resolved;  Also noted to have renal insuffic w/ BUN=43, Creat=2.3, K=5.4;  Lisinopril/ Lasix/ KCl- all stopped & f/u improved...    Ortho f/u DrHardy due soon; he is still non-weight bearing since his 2 operations (1st- external fixation, 2nd- ORIF) but anticipating start of phys therapy soon...    He has persistent pain in right hip area- referred from back & prev responded to shots from DrElsner/ [place]; he will call for follow up...    Wife feels that he is depressed & wants him on medication; he laughs & does not agree (thinks he is more anxious) so we settled on Zoloft 50mg /d as trial...    Note: spent w/ pt/ wife/ reviewing Hosp records/ subseq labs/ med reconciliation/ & counseling...   ~  June 29, 2011:  6wk ROV & he has some persist leg discomfort & due to f/u w/ DrHandy soon; he's noted some mild swelling & we reviewed no salt, elevation, TEDs;  We also reviewed his medical problems, medications, XRays, & LABS today> see prob list below>>  LABS 3/13:  FLP- at goals on Cres10;  Chems- all wnl & A1c=6.6 on ; CBC- Hg=11.5;  TSH=3.51;  VitD= 39;  PSA=2.75  ~  September 29, 2011:  81mo ROV & [place] called [place] in the interval since his last visit w/ some hypoglycemia- BS down to 40's at night & he decreased his Metformin & Glyburide from 2Bid to 1Bid; he was also drinking Boost up to 3 cans daily; since the adjustments his sugars have normalized w/ FBS in the 70-90 range & no hypoglycemia but still doing the Boost supplements Qhs; his weight is down 3# to 191# & he is asked to stop the Boost for now & decr the Glyburide to 5mg  Qam only- labs pending today for further adjustments...    We  reviewed prob list, meds, xrays and labs> see below >> LABS 6/18:  Chems- BS=190 A1c=7.4 and we decided to decr the Glyburide to 5mg Qam only, but keep the Metform500-2Bid & same Actos & Onglyza...  ~  January 18, 2012:  73mo ROV & unfortunately Peter Nicholson has gained 16# up to 206# & we reviewed low carb, low fat diet & exercise program needed to lose the weight;  BS is up to  171 & A1c up to 7.7;  We reiterated his important regular med regimen w/ Metform500-2Bid, Glyburide5Qam, Actos30, Onglyza5...  His CC today is incr reflux- w/ dysphagia & food sticking in mid-esoph that occurs every 2-3days & has to vomit to feel better "I quit eating at the cafe across the street"; he had stopped his PPI- Protonix on his own (?why?), PMHx indicates hx 4cmHH, erosive esoph in 2009 & stricture dilated in 2003; rec to restart the Protonix40 & we will refer to GI for EGD & dilatation...    We reviewed prob list, meds, xrays and labs> see below for updates >> OK Flu shot today. LABS 10/13:  Chems- ok x BS=171 A1c=7.7;  CBC- Hg=12.6...   04/25/2012 Acute OV  Wife reports some short-term memory issues.  pt states he is "not paying attention" and "selective memory" Family and wife have noticed a few issues on/off that his memory is just not as good as it should be.  Pt does not feel any memory issues.  Maternal Grandfather had dementia . Mother ? Dementia in mid 81s.   No other relatives known.  No headache, chest pain , n/v/d, speech/visual changes Last labs in 10/13 w/ A1C at 7.7 otherwise no significant changes  MMSE>26 /30 -poor recall  Clock draw abn    Problem List:     OBSTRUCTIVE SLEEP APNEA (ICD-327.23) - s/p split night sleep study 11/08- showing an AHI of 19, assoc w/ an O2 sat down to 78%... during the CPAP trial he titrated up to a pressure of 11cm but never ret to REM sleep so optimal pressure is still ???...he uses [facility] for his CPAP needs... ~  12/10: he tells me that he has stopped the  CPAP, that he sleeps better without it, not snoring/ rests well, & wakes feeling refreshed... he refuses Sleep Med re-eval etc...  Hx of HYPERTENSION - not currently on medications...  ~  6/12: BP= 136/80 off meds & denies HA, visual changes, CP, palipit, dizziness, syncope, dyspnea, edema, etc... he states that home BP checks are "OK". ~  1/13: he was sent home from the NH/ Rehab on Lisinopril & Lasix; both stopped 1/13 by TP w/ renal insuffic & elev K+; subseq improved & BP stable... ~  2/13: BP= 112/68 & feeling weak etc...  3/13:  BP= 140/72 & feeling some better... ~  6/13:  BP= 138/78 & feeling much better; denies CP, palpit, ch in SOB or edema... ~  10/13:  BP= 140/82 & he is c/o reflux & dysphagia today, no CP/ palpit/ SOB/ edema...  CEREBROVASCULAR DISEASE (ICD-437.9) - stable on ASA 325mg /d & PLAVIX 75mg /d per DrDeveshwar/ DrLawson/ DrReynolds... he has severe extracranial cerebrovasc disease- esp in the post circulation, and he is s/p vertebral art angioplasty x 2, and w/ a known basilar art occlusion- prev on Coumadin controlled by Neurology (switched to ASA/ Plavix in 2010)... also followed by Windell Moulding and DrDeveshwar for IR... he had a left CAE in 6/00 by [facility]... ~  CDoppler 2/07 showed 0-39% bilat ICA stenoses, s/p left CAE w/ DPA... ~  eval 3/10 by Windell Moulding w/ CDopplers that showed similar patency w/ 0-39% bilat ICA stenoses; due to the 10 yr interval DrLawson did not feel he needed any additional follow up. ~  12/10: continued f/u DrDevershwar IR w/ MRI/ MRAs showing bilat carotid siphon region atherosclerosis & narrowing (unchanged), and markedly diseased distal left vertebral & prox basilar art narrowing (no change)... ~  11/12: CTA head &  neck showed similar severe dis w/ mod to severe bilat cavernous ICA stenoses due to extensive atherosclerosis; severe distal vertebral atherosclerosis- distal right occluded, distal left w/ high grade stenosis; mod to severe bilat PCA stenoses;  chr cerebellar infarcts, no acute infarcts.  VENOUS INSUFFICIENCY (ICD-459.81) - he knows to avoid sodium, elevate legs, wear support hose...  HYPERCHOLESTEROLEMIA (ICD-272.0) - on CRESTOR 10mg /d since Hosp 12/12... ~  FLP 6/08 showed TChol 153, TG 120, HDL 36, LDL 93 ~  FLP 8/09 showed TChol 131, TG 84, HDL 38, LDL 76 ~  2010:  pt was not fasting for blood work... rec to continue Crestor10 + diet efforts. ~  FLP 10/11 showed TChol 210, TG 161, HDL 40, LDL 149... rec to start Crestor 20mg /d, he will decide. ~  FLP 6/12 on Cres20 showed TChol 106, TG 95, HDL 41, LDL 46... Continue same. ~  FLP 12/12 in Hosp (on Cres20 PTA) showed TChol 100, TG 101, HDL 37, LDL 43... They decr to Cres10. ~  FLP 3/13 on Cres10 showed TChol 118, TG 67, HDL 44, LDL 61... Continue same.  DIABETES MELLITUS (ICD-250.00) - on METFORMIN 500mg - 2Bid, GLYBURIDE 5mg Bid, ACTOS 30mg /d, & ONGLYZA 5mg /d... ~  labs 6/08 showed BS= 155, HgA1c= 8.1.Marland Kitchen. (glyburide incr to 5Bid). ~  labs 11/08 showed BS= 352, HgA1c= 8.1.... (Actos added). ~  labs 8/09 showed BS= 128, HgA1c= 7.1.Marland KitchenMarland Kitchen rec- same meds, better diet, get wt down! ~  labs 2/10 showed BS= 249, A1c= 7.3.... Metform incr to 2Bid. ~  labs 12/10 (nonfasting- on all 3 meds) showed BS= 87, A1c= 6.9 ~  labs 10/11 on Metform1000Bid+Gybur5Bid showed BS= 180, A1c= 8.4.Marland KitchenMarland Kitchen rec to incr GLYBUR10Bid + diet. ~  labs 6/12 on Metform1000Bid+Glybur10Bid showed BS= 196, A1c= 11.2 ?what happened?> he declined insulin & Endocrine consult, opted for max oral meds by adding ACTOS 30mg /d & ONGLYZA 5mg /d... ~  Labs 12/12 in [place] showed BS 150-200+ and A1c= 7.2; covered w/ SSI in hosp & placed back on 4 oral meds on disch from rehab... ~  Labs 1/13 on all 4 meds showed BS= 230, A1c=7.1; we reviewed diet & he will start PT soon... ~  Labs 3/13 on showed BS= 72, A1c= 6.6 ~  4/13: pt called w/ hypoglycemia & we decreased Glyburide 5mg  from 2Bid to 1Bid... ~  6/13:  Pt on Metform500-2Bid,  Glybur5mg Bid, Actos30, Onglyza5; BS at home 70-90 he says & still drinking Boost; Labs showed BS=190  A1c=7.4  ; we decided to cut the Glyburide5mg  Qam only but keep the Metform500-2Bid +Actos +Onglyza... ~  10/13: on his 27meds> BS=171, A1c=7.7 but he's gained 16#; told to STOP the boost, get on diet, get wt down, take all meds every day...  OVERWEIGHT (ICD-278.02) - he states that he is not on much of a diet & we reviewed low carb/ no sweets/ low fat diet. ~  weight 8/09 = 216# ~  weight 2/10 = 216# ~  weight 12/10 = 212# ~  weight 10/11 = 210# ~  Weight 6/12 = 206# and he admits to not being on much of a diet... ~  Weight 2/13 = non weight bearing still... ~  Weight 3/13 = 193# ~  Weight 6/13 = 191# ~  Weight 10/13 = 206#  GERD (ICD-530.81) - prev on Nexium40, he switched to OTC Prilosec, now PROTONIX 40mg /d due to Plavix Rx; pt had stopped this on his own & asked to restart 10/13. ~  EGD 19/03 showed 4cmHH, GERD & esoph  stricture dilated... ~  EGD 12/09 by DrPatterson showed HH & severe erosive esophagitis from GERD... ~  10/13: presents w/ incr reflux- w/ dysphagia & food sticking in mid-esoph that occurs every 2-3days & has to vomit to feel better; he had stopped his PPI on his own (?why?), rec to restart the Protonix40 & we will refer to GI for EGD & dilatation...  DIVERTICULOSIS OF COLON (ICD-562.10) - note: he was hosp in [place] briefly 12/09 for fecal impaction; prev on Miralax & Colase but recently noted loose stools on the Metformin 1000mg Bid... ~  colonoscopy 10/03 by DrPatterson showed divertics only... ~  colonoscopy 12/09 by DrPatterson was also normal x for divertics...  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 Hx of RENAL CELL CANCER (ICD-189.0) - he had a partial nephrectomy of the right side of his horseshoe kidney in 1994 due to renal cell ca... still followed by DrPeterson yearly by his hx but we don't have any recent  notes... ~  labs 10/11 showed PSA= 0.95 ~  Labs 3/13 showed BUN=21, Creat=1.1, PSA=2.75 ~  Labs 10/13 showed BUN= 25, Creat= 1.2  Hx of NEPHROLITHIASIS (ICD-592.0) - remote hx of kidney stones followed by DrPeterson...  DEGENERATIVE JOINT DISEASE (ICD-715.90) - and elbow tendonitis Rx'd OTC Prn...  GOUT (ICD-274.9) - on ALLOPURINOL 300mg /d... ~  labs 10/11 showed URIC= 4.7  BACK PAIN, LUMBAR (ICD-724.2) - known degenerative spondylitic disease at L4-5 and L5-S1 prev followed by [facility] (now DrElsner & DrHarkins)... ~  6/12: he reports further back pain w/ eval by Chiropractor showing L4 disc prob & regular adjustments; but now notes incr pain & he requests referral to Neurosurg for further eval ==> seen by DrElsner, then DrHarkins for shots which has helped...  ANXIETY (ICD-300.00) - uses ALPRAZOLAM 0.5mg  as needed.   Past Surgical History  Procedure Date  . Incisional hernia repair 1/96    Dr.Leone  . Nasal sinus surgery 12/96    Dr.Redman  . Cholecystectomy 9/04    Dr. Maryagnes Amos  . Nephrectomy 1994    renal cell cancer in horseshoe kidney; right  . Carotid endarterectomy 09/2000    Dr.Lawson  . Veterbral art angioplasty     x2  . Small intestine surgery   . External fixation leg 03/10/2011    Procedure: EXTERNAL FIXATION LEG;  Surgeon: Budd Palmer;  Location: [facility] OR;  Service: Orthopedics;  Laterality: Right;  . Orif tibia plateau 03/24/2011    Procedure: OPEN REDUCTION INTERNAL FIXATION (ORIF) TIBIAL PLATEAU;  Surgeon: Budd Palmer;  Location: [facility] OR;  Service: Orthopedics;  Laterality: Right;    Outpatient Encounter Prescriptions as of 04/25/2012  Medication Sig Dispense Refill  . allopurinol (ZYLOPRIM) 300 MG tablet Take 1 tablet (300 mg total) by mouth daily.  90 tablet  3  . ALPRAZolam (XANAX) 0.5 MG tablet TAKE ONE-HALF TO ONE TABLET BY MOUTH THREE TIMES DAILY AS NEEDED FOR  NERVES  90 tablet  5  . aspirin 325 MG EC tablet Take 325 mg by mouth daily.         Marland Kitchen b complex vitamins tablet Take 1 tablet by mouth daily.      . cholecalciferol (VITAMIN D) 1000 UNITS tablet Take 1,000 Units by mouth daily.      . clopidogrel (PLAVIX) 75 MG tablet Take 1 tablet (75 mg total) by mouth daily.  90 tablet  3  . glucose blood (BAYER CONTOUR NEXT TEST) test strip  Use as instructed  100 each  11  . glyBURIDE (DIABETA) 5 MG tablet Take 1 tablet (5 mg total) by mouth daily with breakfast.  90 tablet  3  . metFORMIN (GLUCOPHAGE) 500 MG tablet Take 2 tablets by mouth two times daily  360 tablet  3  . Multiple Vitamin (MULTIVITAMIN) tablet Take 1 tablet by mouth daily.        . pantoprazole (PROTONIX) 40 MG tablet Take 40 mg by mouth daily.      . pioglitazone (ACTOS) 30 MG tablet Take 1 tablet (30 mg total) by mouth daily.  90 tablet  3  . simvastatin (ZOCOR) 40 MG tablet Take 1 tablet (40 mg total) by mouth at bedtime.  90 tablet  3  . saxagliptin HCl (ONGLYZA) 5 MG TABS tablet Take 1 tablet (5 mg total) by mouth daily.  90 tablet  3    Allergies  Allergen Reactions  . Hydromorphone Hcl     REACTION: hallucinations  . Promethazine Hcl     REACTION: hallucinations    Current Medications, Allergies, Past Medical History, Past Surgical History, Family History, and Social History were reviewed in [employer] record.    Review of Systems        Constitutional:   No  weight loss, night sweats,  Fevers, chills,  +fatigue, or  lassitude.  HEENT:   No headaches,  Difficulty swallowing,  Tooth/dental problems, or  Sore throat,                No sneezing, itching, ear ache, nasal congestion, post nasal drip,   CV:  No chest pain,  Orthopnea, PND, swelling in lower extremities, anasarca, dizziness, palpitations, syncope.   GI  No heartburn, indigestion, abdominal pain, nausea, vomiting, diarrhea, change in bowel habits, loss of appetite, bloody stools.   Resp: No shortness of breath with exertion or at rest.  No excess mucus, no  productive cough,  No non-productive cough,  No coughing up of blood.  No change in color of mucus.  No wheezing.  No chest wall deformity  Skin: no rash or lesions.  GU: no dysuria, change in color of urine, no urgency or frequency.  No flank pain, no hematuria   MS:  No joint pain or swelling.  No decreased range of motion.  No back pain.  Psych:  No change in mood or affect. No depression or anxiety.          Objective:   Physical Exam     WD, WN, 72 y/o WM in NAD...  GENERAL:  Alert & oriented; pleasant & cooperative... HEENT:  [place]/AT, EOM-wnl, PERRLA, EACs-clear, TMs-wnl, NOSE-clear, THROAT-clear & wnl. NECK:  Supple w/ fairROM; no JVD; s/p left CAE, +bruits; no thyromegaly or nodules palpated; no lymphadenopathy. CHEST:  Clear to P & A; without wheezes/ rales/ or rhonchi heard... HEART:  Regular Rhythm; gr 1/6 SEM without rubs or gallops detected... ABDOMEN:  Soft & nontender; normal bowel sounds; no organomegaly or masses palpated... EXT: right knee deformity s/p ORIF, mod arthritic changes; no varicose veins/ +venous insuffic/ 1+ edema. NEURO:  CN's intact; no focal neuro deficits...nml gait  DERM:  No lesions noted; no rash etc...    MMSE 04/25/2012 26/30 Clock draw 04/25/2012 abn     Assessment & Plan:

## 2012-04-26 ENCOUNTER — Encounter: Payer: Self-pay | Admitting: Adult Health

## 2012-04-27 MED ORDER — CLOPIDOGREL BISULFATE 75 MG PO TABS: 75.0000 mg | ORAL_TABLET | Freq: Every day | ORAL | Status: DC

## 2012-04-27 MED ORDER — METFORMIN HCL 500 MG PO TABS: ORAL_TABLET | ORAL | Status: DC

## 2012-04-27 MED ORDER — ALLOPURINOL 300 MG PO TABS: 300.0000 mg | ORAL_TABLET | Freq: Every day | ORAL | Status: DC

## 2012-04-27 MED ORDER — GLYBURIDE 5 MG PO TABS: 5.0000 mg | ORAL_TABLET | Freq: Two times a day (BID) | ORAL | Status: DC

## 2012-04-27 NOTE — Addendum Note (Signed)
Addended by: Boone Master E on: 04/27/2012 12:43 PM   Modules accepted: Orders

## 2012-04-27 NOTE — Progress Notes (Signed)
Quick Note:  Called spoke with patient, advised of lab results / recs as stated by TP. Pt verbalized his understanding and denied any questions. Pt has yet to hear from Neuro; will back next week if ov is not scheduled. Pt requesting refills on all maintenance meds - addendum to 1.13.14 ov made for this request. ______

## 2012-05-25 ENCOUNTER — Other Ambulatory Visit: Payer: Self-pay | Admitting: Pulmonary Disease

## 2012-05-31 ENCOUNTER — Telehealth: Payer: Self-pay | Admitting: Pulmonary Disease

## 2012-05-31 MED ORDER — ALPRAZOLAM 0.5 MG PO TABS: ORAL_TABLET | ORAL | Status: DC

## 2012-05-31 NOTE — Telephone Encounter (Signed)
RX was called in 05/25/12 #90 x 2 refills. I called [facility] to verify this. RX was not received. Gave verbal order for this. Nothing further was needed

## 2012-06-10 ENCOUNTER — Encounter: Payer: Self-pay | Admitting: Neurology

## 2012-06-10 DIAGNOSIS — R208 Other disturbances of skin sensation: Secondary | ICD-10-CM | POA: Insufficient documentation

## 2012-06-10 DIAGNOSIS — H53139 Sudden visual loss, unspecified eye: Secondary | ICD-10-CM | POA: Insufficient documentation

## 2012-06-10 DIAGNOSIS — R0989 Other specified symptoms and signs involving the circulatory and respiratory systems: Secondary | ICD-10-CM | POA: Insufficient documentation

## 2012-06-10 DIAGNOSIS — G458 Other transient cerebral ischemic attacks and related syndromes: Secondary | ICD-10-CM | POA: Insufficient documentation

## 2012-06-10 DIAGNOSIS — I6529 Occlusion and stenosis of unspecified carotid artery: Secondary | ICD-10-CM | POA: Insufficient documentation

## 2012-06-15 ENCOUNTER — Telehealth: Payer: Self-pay | Admitting: Pulmonary Disease

## 2012-06-15 MED ORDER — METFORMIN HCL 500 MG PO TABS: ORAL_TABLET | ORAL | Status: DC

## 2012-06-15 NOTE — Telephone Encounter (Signed)
Called and spoke with pt and he is aware of refills that have been sent to the [facility] drug.  Nothing further is needed.

## 2012-07-11 ENCOUNTER — Other Ambulatory Visit: Payer: Self-pay | Admitting: *Deleted

## 2012-07-11 ENCOUNTER — Encounter: Payer: Self-pay | Admitting: Neurology

## 2012-07-11 ENCOUNTER — Ambulatory Visit (INDEPENDENT_AMBULATORY_CARE_PROVIDER_SITE_OTHER): Payer: Self-pay | Admitting: Neurology

## 2012-07-11 VITALS — BP 159/80 | HR 83 | Temp 97.7°F | Ht 66.3 in | Wt 193.0 lb

## 2012-07-11 DIAGNOSIS — I6529 Occlusion and stenosis of unspecified carotid artery: Secondary | ICD-10-CM

## 2012-07-11 DIAGNOSIS — I6789 Other cerebrovascular disease: Secondary | ICD-10-CM

## 2012-07-11 DIAGNOSIS — R413 Other amnesia: Secondary | ICD-10-CM

## 2012-07-11 NOTE — Patient Instructions (Signed)
I had a long discussion the patient and wife with regards to mild cognitive impairment which I think is age appropriate.We also discussed in brief risk of developing progressive dementia and Alzheimer's given his family history. The plan to check memory panel labs, EEG and MRI scan of the brain. We will also check MRA of the brain and neck given his history of cerebrovascular disease. I recommend he start taking 2 fish oil capsules daily and Cerefolin NAC one tablet daily. I also encouraged him to increase participation and cognitive Nedra Hai challenging activities like solving puzzles, crossword, sodoku Return for followup in 2 months or call earlier if necessary.

## 2012-07-11 NOTE — Progress Notes (Signed)
HPI: 83 year or Caucasian male referred by Dr. Chauncey Mann for evaluation for memory loss. He is a complain by his wife who is noted mild memory difficulties for the last 2 years. The patient himself denies this and states that his not notice any problems. The wife states that he has trouble making new memory and remembering new things. He has to be told several times and yet doesn't   remember. He reports no difficulties with performing his activities of daily living. He is fully independent and quite active physically. He denies getting lost or having trouble speaking or word finding difficulties. He does have family history of house and is in his mother and hands and the wife is concerned about this.He has to history of significant head injury with loss of consciousness, seizures or headaches. He does have a remote history of TIA in November 2012 and saw Dr. Anne Hahn at that time. He does have multiple vascular is factors of diabetes, hypertension, hyperlipidemia. He states his risk factors are under good control. He takes Plavix for stroke prevention but has stopped it a few days ago for a planned the epidural steroid injection next week. ROS: 14 system review of systems is positive for hearing loss, trouble swallowing, snoring, diarrhea, constipation, feeling cold, joint pain, memory loss, confusion, restless legs and anxiety. Physical Exam General: well developed, well nourished, seated, in no evident distress Head: head normocephalic and atraumatic. Orohparynx benign Neck: supple with no carotid or supraclavicular bruits.Left carotid surgery scar Cardiovascular: regular rate and rhythm, no murmurs Musculoskeletal no deformity Skin no rash  Neurologic Exam Mental Status: Awake and fully alert. Oriented to place and time. Recent and remote memory intact. Attention span, concentration and fund of knowledge appropriate. Mood and affect appropriate. Mini-Mental status exam scored 27/30 with 2 deficits in  orientation and one in recall. Animal naming test he scored 14 which is normal. Clock drawing 4/4 which was also normal. Cranial Nerves: Fundoscopic exam reveals sharp disc margins. Pupils equal, briskly reactive to light. Extraocular movements full without nystagmus. Visual fields full to confrontation. Hearing intact and symmetric to finer snap. Facial sensation intact. Face, tongue, palate move normally and symmetrically. Neck flexion and extension normal.  Motor: Normal bulk and tone. Normal strength in all tested extremity muscles. Sensory.: intact to tough and pinprick and vibratory.  Coordination: Rapid alternating movements normal in all extremities. Finger-to-nose and heel-to-shin performed accurately bilaterally. Gait and Station: Arises from chair without difficulty. Stance is normal. Gait demonstrates normal stride length and balance . Able to heel, toe and tandem walk without difficulty.  Reflexes: 1+ and symmetric. Toes downgoing.     ASSESSMENT::  72 year old patient with two-year history of mild memory loss likely do to age-appropriate mild cognitive impairment. Prior history of left hemispheric TIA in November 2012 and vascular risk factors of hypertension, diabetes, hyperlipidemia and cerebrovascular disease.   PLAN: Check memory panel labs, EEG and MRI scan of the brain and MRA of the brain and neck. i have advised him to start taking 2 fish oil capsules daily and Cerefolin NAC and given him  some samples. I encouraged him to participate in cognitively challenging activities like solving puzzles, crosswords, sodoku. Continue Plavix for stroke prevention with strict control of hypertension with blood pressure goal below 120/80, lipids with LDL cholesterol goal below 70 mg percent and diabetes with Hb A1c goal below 6.5%. Return for followup in 2 months.

## 2012-07-12 LAB — RPR: RPR: NONREACTIVE

## 2012-07-14 ENCOUNTER — Ambulatory Visit (INDEPENDENT_AMBULATORY_CARE_PROVIDER_SITE_OTHER): Payer: Medicare Other

## 2012-07-14 DIAGNOSIS — R413 Other amnesia: Secondary | ICD-10-CM

## 2012-07-14 NOTE — Procedures (Signed)
[facility]      [address]      [place].  [address]       Electroencephalogram Procedure Note    Indications: Diagnostic Date of Procedure : 07/14/12 Medications: none Clinical history : 72 year old male being evaluated for memory loss Technical Description  This study was performed using  26 channeldigital electroencephalographic recording equipment. International 10-20 electrode placement was used. The record was obtained with the patient awake.  The record is of excellent technical quality for purposes of interpretation.   Activation Procedures:  photic stimulation . EEG Description Awake: Alpha Activity: The waking state record contains a well-defined bi-occipital alpha rhythm of  low amplitude with a dominant frequency of  11 Hz. Reactivity is present.   Sleep: With drowsiness, there is attenuation of the background alpha activity. Advanced stages of sleep are not noted Result of Activation Procedures: Hyperventilation: Not performed. Photo Stimulation: Photic stimulation failed to activated the recording.  Summary Normal electroencephalogram, awake, asleep and with activation procedures. There are no focal lateralizing or epileptiform features.

## 2012-07-21 ENCOUNTER — Ambulatory Visit
Admission: RE | Admit: 2012-07-21 | Discharge: 2012-07-21 | Disposition: A | Discharge status: Home / Self Care | Payer: Medicare Other | Source: Ambulatory Visit | Attending: Neurology | Admitting: Neurology

## 2012-07-21 DIAGNOSIS — I6529 Occlusion and stenosis of unspecified carotid artery: Secondary | ICD-10-CM

## 2012-07-21 DIAGNOSIS — I6789 Other cerebrovascular disease: Secondary | ICD-10-CM

## 2012-07-21 DIAGNOSIS — G459 Transient cerebral ischemic attack, unspecified: Secondary | ICD-10-CM

## 2012-07-21 DIAGNOSIS — R413 Other amnesia: Secondary | ICD-10-CM

## 2012-07-21 MED ORDER — GADOBENATE DIMEGLUMINE 529 MG/ML IV SOLN
17.0000 mL | Freq: Once | INTRAVENOUS | Status: AC | PRN
  Administered 2012-07-21: 17 mL via INTRAVENOUS

## 2012-07-27 ENCOUNTER — Telehealth: Payer: Self-pay | Admitting: *Deleted

## 2012-07-27 NOTE — Telephone Encounter (Signed)
Called patient and gave him his normal EEG results.

## 2012-07-28 ENCOUNTER — Other Ambulatory Visit: Payer: Self-pay | Admitting: Pulmonary Disease

## 2012-08-01 ENCOUNTER — Encounter: Payer: Self-pay | Admitting: *Deleted

## 2012-08-01 ENCOUNTER — Telehealth: Payer: Self-pay | Admitting: Pulmonary Disease

## 2012-08-01 MED ORDER — ALPRAZOLAM 0.5 MG PO TABS: ORAL_TABLET | ORAL | Status: DC

## 2012-08-01 NOTE — Telephone Encounter (Signed)
Spoke with pt requesting rx  Xanax.    0.5mg  1/2 to 1 tablet tid prn nerves.  #90  Allergies  Allergen Reactions  . Dilaudid (Hydromorphone Hcl)   . Promethazine Hcl     REACTION: hallucinations   No OV scheduled at this time.  Dr Kriste Basque please advise  Thank you

## 2012-08-01 NOTE — Telephone Encounter (Signed)
Called and gave refills to the pharmacy for the pt per SN.  Called and spoke with pt and he is aware of refill being called to the pharmacy. Nothing further is needed.

## 2012-10-26 ENCOUNTER — Ambulatory Visit: Payer: Medicare Other | Admitting: Neurology

## 2012-12-26 ENCOUNTER — Other Ambulatory Visit: Payer: Self-pay | Admitting: Pulmonary Disease

## 2013-01-17 ENCOUNTER — Encounter: Payer: Self-pay | Admitting: Adult Health

## 2013-01-17 ENCOUNTER — Other Ambulatory Visit (INDEPENDENT_AMBULATORY_CARE_PROVIDER_SITE_OTHER): Payer: Medicare Other

## 2013-01-17 ENCOUNTER — Ambulatory Visit (INDEPENDENT_AMBULATORY_CARE_PROVIDER_SITE_OTHER): Payer: Medicare Other | Admitting: Adult Health

## 2013-01-17 VITALS — BP 126/76 | HR 103 | Temp 97.9°F | Ht 66.0 in | Wt 198.0 lb

## 2013-01-17 DIAGNOSIS — R300 Dysuria: Secondary | ICD-10-CM

## 2013-01-17 DIAGNOSIS — I10 Essential (primary) hypertension: Secondary | ICD-10-CM

## 2013-01-17 DIAGNOSIS — E119 Type 2 diabetes mellitus without complications: Secondary | ICD-10-CM

## 2013-01-17 DIAGNOSIS — F411 Generalized anxiety disorder: Secondary | ICD-10-CM

## 2013-01-17 DIAGNOSIS — E7800 Pure hypercholesterolemia, unspecified: Secondary | ICD-10-CM

## 2013-01-17 DIAGNOSIS — N390 Urinary tract infection, site not specified: Secondary | ICD-10-CM

## 2013-01-17 LAB — URINALYSIS, ROUTINE W REFLEX MICROSCOPIC
Ketones, ur: NEGATIVE
Specific Gravity, Urine: 1.015 (ref 1.000–1.030)
Urine Glucose: >=1000
Urobilinogen, UA: 0.2 (ref 0.0–1.0)

## 2013-01-17 MED ORDER — CIPROFLOXACIN HCL 500 MG PO TABS: 500.0000 mg | ORAL_TABLET | Freq: Two times a day (BID) | ORAL | Status: AC

## 2013-01-17 NOTE — Patient Instructions (Addendum)
Cipro 500 mg twice daily for 10 days. Low sweet diet. Fluids and rest. Return in 2 days for fasting labs, and new insulin start. Please contact office for sooner follow up if symptoms do not improve or worsen or seek emergency care

## 2013-01-17 NOTE — Progress Notes (Signed)
Subjective:    Patient ID: Peter Nicholson, male    DOB: 1941/03/17, 72 y.o.   MRN: [id number]  HPI 72 y/o WM    04/25/12  Acute OV  Wife reports some short-term memory issues.  pt states he is "not paying attention" and "selective memory" Family and wife have noticed a few issues on/off that his memory is just not as good as it should be.  Pt does not feel any memory issues.  Maternal Grandfather had dementia . Mother ? Dementia in mid 69s.   No other relatives known.  No headache, chest pain , n/v/d, speech/visual changes Last labs in 10/13 w/ A1C at 7.7 otherwise no significant changes  MMSE>26 /30 -poor recall  Clock draw abn  >refer to neuro   01/17/2013 Acute OV  Complains of dysuria, increased frequency, dark in color, strong odor x2 weeks - denies f/c/s, back pain. No flank pain, fever , n/v/d, or hematuria. No polydipisia. Does have polyuria and urgency. Symptoms worse for last 2 days. UA today shows signs of UTI . Large glucose in urine.  Does not check BS at home on regular basis.  BS today in office 266. We discussed his DM control . He is on max therapy and assures me he is taking meds correctly but eats what he wants.  We discussed he need to return for fasting labs.   Neurology consult 07/11/12 with mild cognitive impairment , advised to manage risk factors with LDL goal and HgA1c goal . MRI with chronic changes . MRA showed sign atherosclerosis, mild right carotid stenosis.   Pt last seen in 04/2012 , did not keep follow up appointment with Dr. Kriste Basque  With labs.   Patient denies any chest pain, orthopnea, PND, leg swelling, rash developing, bloody stools  Problem List:     OBSTRUCTIVE SLEEP APNEA (ICD-327.23) - s/p split night sleep study 11/08- showing an AHI of 19, assoc w/ an O2 sat down to 78%... during the CPAP trial he titrated up to a pressure of 11cm but never ret to REM sleep so optimal pressure is still ???...he uses [facility] for his CPAP  needs... ~  12/10: he tells me that he has stopped the CPAP, that he sleeps better without it, not snoring/ rests well, & wakes feeling refreshed... he refuses Sleep Med re-eval etc...  Hx of HYPERTENSION - not currently on medications...  ~  6/12: BP= 136/80 off meds & denies HA, visual changes, CP, palipit, dizziness, syncope, dyspnea, edema, etc... he states that home BP checks are "OK". ~  1/13: he was sent home from the NH/ Rehab on Lisinopril & Lasix; both stopped 1/13 by TP w/ renal insuffic & elev K+; subseq improved & BP stable... ~  2/13: BP= 112/68 & feeling weak etc...  3/13:  BP= 140/72 & feeling some better... ~  6/13:  BP= 138/78 & feeling much better; denies CP, palpit, ch in SOB or edema... ~  10/13:  BP= 140/82 & he is c/o reflux & dysphagia today, no CP/ palpit/ SOB/ edema...  CEREBROVASCULAR DISEASE (ICD-437.9) - stable on ASA 325mg /d & PLAVIX 75mg /d per DrDeveshwar/ DrLawson/ DrReynolds... he has severe extracranial cerebrovasc disease- esp in the post circulation, and he is s/p vertebral art angioplasty x 2, and w/ a known basilar art occlusion- prev on Coumadin controlled by Neurology (switched to ASA/ Plavix in 2010)... also followed by Windell Moulding and DrDeveshwar for IR... he had a left CAE in 6/00 by [facility]... ~  CDoppler 2/07 showed 0-39% bilat ICA stenoses, s/p left CAE w/ DPA... ~  eval 3/10 by Windell Moulding w/ CDopplers that showed similar patency w/ 0-39% bilat ICA stenoses; due to the 10 yr interval DrLawson did not feel he needed any additional follow up. ~  12/10: continued f/u DrDevershwar IR w/ MRI/ MRAs showing bilat carotid siphon region atherosclerosis & narrowing (unchanged), and markedly diseased distal left vertebral & prox basilar art narrowing (no change)... ~  11/12: CTA head & neck showed similar severe dis w/ mod to severe bilat cavernous ICA stenoses due to extensive atherosclerosis; severe distal vertebral atherosclerosis- distal right occluded, distal left w/  high grade stenosis; mod to severe bilat PCA stenoses; chr cerebellar infarcts, no acute infarcts.  VENOUS INSUFFICIENCY (ICD-459.81) - he knows to avoid sodium, elevate legs, wear support hose...  HYPERCHOLESTEROLEMIA (ICD-272.0) - on CRESTOR 10mg /d since Hosp 12/12... ~  FLP 6/08 showed TChol 153, TG 120, HDL 36, LDL 93 ~  FLP 8/09 showed TChol 131, TG 84, HDL 38, LDL 76 ~  2010:  pt was not fasting for blood work... rec to continue Crestor10 + diet efforts. ~  FLP 10/11 showed TChol 210, TG 161, HDL 40, LDL 149... rec to start Crestor 20mg /d, he will decide. ~  FLP 6/12 on Cres20 showed TChol 106, TG 95, HDL 41, LDL 46... Continue same. ~  FLP 12/12 in Hosp (on Cres20 PTA) showed TChol 100, TG 101, HDL 37, LDL 43... They decr to Cres10. ~  FLP 3/13 on Cres10 showed TChol 118, TG 67, HDL 44, LDL 61... Continue same.  DIABETES MELLITUS (ICD-250.00) - on METFORMIN 500mg - 2Bid, GLYBURIDE 5mg Bid, ACTOS 30mg /d, & ONGLYZA 5mg /d... ~  labs 6/08 showed BS= 155, HgA1c= 8.1.Marland Kitchen. (glyburide incr to 5Bid). ~  labs 11/08 showed BS= 352, HgA1c= 8.1.... (Actos added). ~  labs 8/09 showed BS= 128, HgA1c= 7.1.Marland KitchenMarland Kitchen rec- same meds, better diet, get wt down! ~  labs 2/10 showed BS= 249, A1c= 7.3.... Metform incr to 2Bid. ~  labs 12/10 (nonfasting- on all 3 meds) showed BS= 87, A1c= 6.9 ~  labs 10/11 on Metform1000Bid+Gybur5Bid showed BS= 180, A1c= 8.4.Marland KitchenMarland Kitchen rec to incr GLYBUR10Bid + diet. ~  labs 6/12 on Metform1000Bid+Glybur10Bid showed BS= 196, A1c= 11.2 ?what happened?> he declined insulin & Endocrine consult, opted for max oral meds by adding ACTOS 30mg /d & ONGLYZA 5mg /d... ~  Labs 12/12 in [place] showed BS 150-200+ and A1c= 7.2; covered w/ SSI in hosp & placed back on 4 oral meds on disch from rehab... ~  Labs 1/13 on all 4 meds showed BS= 230, A1c=7.1; we reviewed diet & he will start PT soon... ~  Labs 3/13 on showed BS= 72, A1c= 6.6 ~  4/13: pt called w/ hypoglycemia & we decreased Glyburide 5mg  from 2Bid  to 1Bid... ~  6/13:  Pt on Metform500-2Bid, Glybur5mg Bid, Actos30, Onglyza5; BS at home 70-90 he says & still drinking Boost; Labs showed BS=190  A1c=7.4  ; we decided to cut the Glyburide5mg  Qam only but keep the Metform500-2Bid +Actos +Onglyza... ~  10/13: on his 4meds> BS=171, A1c=7.7 but he's gained 16#; told to STOP the boost, get on diet, get wt down, take all meds every day...  OVERWEIGHT (ICD-278.02) - he states that he is not on much of a diet & we reviewed low carb/ no sweets/ low fat diet. ~  weight 8/09 = 216# ~  weight 2/10 = 216# ~  weight 12/10 = 212# ~  weight 10/11 = 210# ~  Weight 6/12 = 206# and he admits to not being on much of a diet... ~  Weight 2/13 = non weight bearing still... ~  Weight 3/13 = 193# ~  Weight 6/13 = 191# ~  Weight 10/13 = 206#  GERD (ICD-530.81) - prev on Nexium40, he switched to OTC Prilosec, now PROTONIX 40mg /d due to Plavix Rx; pt had stopped this on his own & asked to restart 10/13. ~  EGD 19/03 showed 4cmHH, GERD & esoph stricture dilated... ~  EGD 12/09 by DrPatterson showed HH & severe erosive esophagitis from GERD... ~  10/13: presents w/ incr reflux- w/ dysphagia & food sticking in mid-esoph that occurs every 2-3days & has to vomit to feel better; he had stopped his PPI on his own (?why?), rec to restart the Protonix40 & we will refer to GI for EGD & dilatation...  DIVERTICULOSIS OF COLON (ICD-562.10) - note: he was hosp in [place] briefly 12/09 for fecal impaction; prev on Miralax & Colase but recently noted loose stools on the Metformin 1000mg Bid... ~  colonoscopy 10/03 by DrPatterson showed divertics only... ~  colonoscopy 12/09 by DrPatterson was also normal x for divertics...  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 Hx of RENAL CELL CANCER (ICD-189.0) - he had a partial nephrectomy of the right side of his horseshoe kidney in 1994 due to renal cell ca... still followed by DrPeterson yearly  by his hx but we don't have any recent notes... ~  labs 10/11 showed PSA= 0.95 ~  Labs 3/13 showed BUN=21, Creat=1.1, PSA=2.75 ~  Labs 10/13 showed BUN= 25, Creat= 1.2  Hx of NEPHROLITHIASIS (ICD-592.0) - remote hx of kidney stones followed by DrPeterson...  DEGENERATIVE JOINT DISEASE (ICD-715.90) - and elbow tendonitis Rx'd OTC Prn...  GOUT (ICD-274.9) - on ALLOPURINOL 300mg /d... ~  labs 10/11 showed URIC= 4.7  BACK PAIN, LUMBAR (ICD-724.2) - known degenerative spondylitic disease at L4-5 and L5-S1 prev followed by [facility] for Neurosurg (now DrElsner & DrHarkins)... ~  6/12: he reports further back pain w/ eval by Chiropractor showing L4 disc prob & regular adjustments; but now notes incr pain & he requests referral to Neurosurg for further eval ==> seen by DrElsner, then DrHarkins for shots which has helped...  ANXIETY (ICD-300.00) - uses ALPRAZOLAM 0.5mg  as needed.   Past Surgical History  Procedure Laterality Date  . Incisional hernia repair  1/96    Dr.Leone  . Nasal sinus surgery  12/96    Dr.Redman  . Cholecystectomy  9/04    Dr. Maryagnes Amos  . 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 Carotid endarterectomy  09/2000    Dr.Lawson  . Veterbral art angioplasty      x2  . Small intestine surgery    . External fixation leg  03/10/2011    Procedure: EXTERNAL FIXATION LEG;  Surgeon: Budd Palmer;  Location: [facility] OR;  Service: Orthopedics;  Laterality: Right;  . Orif tibia plateau  03/24/2011    Procedure: OPEN REDUCTION INTERNAL FIXATION (ORIF) TIBIAL PLATEAU;  Surgeon: Budd Palmer;  Location: [facility] OR;  Service: Orthopedics;  Laterality: Right;    Outpatient Encounter Prescriptions as of 01/17/2013  Medication Sig Dispense Refill  . allopurinol (ZYLOPRIM) 300 MG tablet Take 1 tablet (300 mg total) by mouth daily.  90 tablet  3  . ALPRAZolam (XANAX) 0.5 MG tablet TAKE ONE-HALF TO ONE TABLET BY MOUTH THREE TIMES DAILY AS NEEDED FOR  NERVES  90 tablet  5   . aspirin 325 MG EC tablet Take 325 mg by mouth daily.        Marland Kitchen b complex vitamins tablet Take 1 tablet by mouth daily.      . cholecalciferol (VITAMIN D) 1000 UNITS tablet Take 1,000 Units by mouth daily.      . clopidogrel (PLAVIX) 75 MG tablet Take 1 tablet (75 mg total) by mouth daily.  90 tablet  3  . glyBURIDE (DIABETA) 5 MG tablet TAKE 1 TABLET (5 MG TOTAL) BY MOUTH DAILY WITH BREAKFAST.  90 tablet  0  . metFORMIN (GLUCOPHAGE) 500 MG tablet Take 2 tablets by mouth two times daily  360 tablet  3  . Multiple Vitamin (MULTIVITAMIN) tablet Take 1 tablet by mouth daily.        . pantoprazole (PROTONIX) 40 MG tablet Take 40 mg by mouth daily.      . pioglitazone (ACTOS) 30 MG tablet Take 1 tablet (30 mg total) by mouth daily.  90 tablet  3  . saxagliptin HCl (ONGLYZA) 5 MG TABS tablet Take 1 tablet (5 mg total) by mouth daily.  90 tablet  3  . simvastatin (ZOCOR) 40 MG tablet Take 1 tablet (40 mg total) by mouth at bedtime.  90 tablet  3  . traMADol (ULTRAM) 50 MG tablet Take 50 mg by mouth every 6 (six) hours as needed for pain.       . [DISCONTINUED] glyBURIDE (DIABETA) 5 MG tablet Take 1 tablet (5 mg total) by mouth 2 (two) times daily with a meal.  180 tablet  3   No facility-administered encounter medications on file as of 01/17/2013.    Allergies  Allergen Reactions  . Dilaudid [Hydromorphone Hcl]   . Promethazine Hcl     REACTION: hallucinations    Current Medications, Allergies, Past Medical History, Past Surgical History, Family History, and Social History were reviewed in [employer] record.    Review of Systems        Constitutional:   No  weight loss, night sweats,  Fevers, chills,  +fatigue, or  lassitude.  HEENT:   No headaches,  Difficulty swallowing,  Tooth/dental problems, or  Sore throat,                No sneezing, itching, ear ache, nasal congestion, post nasal drip,   CV:  No chest pain,  Orthopnea, PND, swelling in lower extremities,  anasarca, dizziness, palpitations, syncope.   GI  No heartburn, indigestion, abdominal pain, nausea, vomiting, diarrhea, change in bowel habits, loss of appetite, bloody stools.   Resp: No shortness of breath with exertion or at rest.  No excess mucus, no productive cough,  No non-productive cough,  No coughing up of blood.  No change in color of mucus.  No wheezing.  No chest wall deformity  Skin: no rash or lesions.  GU: + dysuria, change in color of urine,  urgency or frequency.  No flank pain, no hematuria   MS:  No joint pain or swelling.  No decreased range of motion.  No back pain.  Psych:  No change in mood or affect. No depression or anxiety.          Objective:   Physical Exam     WD, WN, 72 y/o WM in NAD...  GENERAL:  Alert & oriented; pleasant & cooperative... HEENT:  [place]/AT, EOM-wnl, PERRLA, EACs-clear, TMs-wnl, NOSE-clear, THROAT-clear & wnl. NECK:  Supple w/ fairROM; no  JVD; s/p left CAE, +bruits; no thyromegaly or nodules palpated; no lymphadenopathy. CHEST:  Clear to P & A; without wheezes/ rales/ or rhonchi heard... HEART:  Regular Rhythm; gr 1/6 SEM without rubs or gallops detected... ABDOMEN:  Soft & nontender; normal bowel sounds; no organomegaly or masses palpated...neg CVA tenderness  EXT: right knee deformity s/p ORIF, mod arthritic changes; no varicose veins/ +venous insuffic/ tr+ edema. NEURO:  CN's intact; no focal neuro deficits...nml gait  DERM:  No lesions noted; no rash etc...      Assessment & Plan:

## 2013-01-19 ENCOUNTER — Ambulatory Visit (INDEPENDENT_AMBULATORY_CARE_PROVIDER_SITE_OTHER): Payer: Medicare Other | Admitting: Adult Health

## 2013-01-19 ENCOUNTER — Encounter: Payer: Self-pay | Admitting: Adult Health

## 2013-01-19 ENCOUNTER — Other Ambulatory Visit (INDEPENDENT_AMBULATORY_CARE_PROVIDER_SITE_OTHER): Payer: Medicare Other

## 2013-01-19 VITALS — BP 132/78 | HR 63 | Temp 97.4°F | Ht 66.0 in | Wt 199.4 lb

## 2013-01-19 DIAGNOSIS — E119 Type 2 diabetes mellitus without complications: Secondary | ICD-10-CM

## 2013-01-19 DIAGNOSIS — Z23 Encounter for immunization: Secondary | ICD-10-CM

## 2013-01-19 DIAGNOSIS — I10 Essential (primary) hypertension: Secondary | ICD-10-CM

## 2013-01-19 DIAGNOSIS — F411 Generalized anxiety disorder: Secondary | ICD-10-CM

## 2013-01-19 DIAGNOSIS — E7800 Pure hypercholesterolemia, unspecified: Secondary | ICD-10-CM

## 2013-01-19 LAB — LIPID PANEL
Cholesterol: 136 mg/dL (ref 0–200)
HDL: 48.7 mg/dL (ref >39.00)
LDL Cholesterol: 77 mg/dL (ref 0–99)
Total CHOL/HDL Ratio: 3
Triglycerides: 54 mg/dL (ref 0.0–149.0)

## 2013-01-19 LAB — CBC WITH DIFFERENTIAL/PLATELET
Basophils Relative: 0.5 % (ref 0.0–3.0)
Eosinophils Absolute: 0.8 10*3/uL — ABNORMAL HIGH (ref 0.0–0.7)
Hemoglobin: 11.7 g/dL — ABNORMAL LOW (ref 13.0–17.0)
MCHC: 34.1 g/dL (ref 30.0–36.0)
MCV: 95.5 fl (ref 78.0–100.0)
Monocytes Absolute: 0.5 10*3/uL (ref 0.1–1.0)
Monocytes Relative: 6.8 % (ref 3.0–12.0)
Neutro Abs: 4.5 10*3/uL (ref 1.4–7.7)
RBC: 3.58 Mil/uL — ABNORMAL LOW (ref 4.22–5.81)

## 2013-01-19 LAB — URINE CULTURE: Colony Count: 50000

## 2013-01-19 LAB — BASIC METABOLIC PANEL
BUN: 38 mg/dL — ABNORMAL HIGH (ref 6–23)
Creatinine, Ser: 1.5 mg/dL (ref 0.4–1.5)
GFR: 50.68 mL/min — ABNORMAL LOW (ref >60.00)
Potassium: 5.2 mEq/L — ABNORMAL HIGH (ref 3.5–5.1)

## 2013-01-19 LAB — HEPATIC FUNCTION PANEL
ALT: 16 U/L (ref 0–53)
Total Bilirubin: 0.4 mg/dL (ref 0.3–1.2)

## 2013-01-19 MED ORDER — INSULIN GLARGINE 100 UNIT/ML SOLOSTAR PEN: 10.0000 [IU] | PEN_INJECTOR | Freq: Every day | SUBCUTANEOUS | Status: DC

## 2013-01-19 MED ORDER — INSULIN PEN NEEDLE 31G X 8 MM MISC: Status: DC

## 2013-01-19 NOTE — Assessment & Plan Note (Signed)
Poor DM control  Return for fasting labs  Consider lantus rx   Plan  Low sweet diet. Fluids and rest. Return in 2 days for fasting labs, and new insulin start. Please contact office for sooner follow up if symptoms do not improve or worsen or seek emergency care

## 2013-01-19 NOTE — Patient Instructions (Addendum)
Refer to DM teaching /dietician at [facility].  Check Blood sugars daily before eating and 1 x week 2 hours after eating.  Keep log of BS and bring to next visit.  Low sweet diet.  Stop Actos.  Begin Lantus 10units At bedtime  .  follow up Dr. Kriste Basque  In 4 weeks and As needed   Please contact office for sooner follow up if symptoms do not improve or worsen or seek emergency care  Continue with eye exam yearly.  Flu shot

## 2013-01-19 NOTE — Assessment & Plan Note (Signed)
Cipro 500 mg twice daily for 10 days.  urine cx pending  Please contact office for sooner follow up if symptoms do not improve or worsen or seek emergency care

## 2013-01-25 NOTE — Progress Notes (Signed)
Subjective:    Patient ID: Peter Nicholson, male    DOB: 23-Jan-1941, 72 y.o.   MRN: [id number]  HPI 72 y/o WM    04/25/12  Acute OV  Wife reports some short-term memory issues.  pt states he is "not paying attention" and "selective memory" Family and wife have noticed a few issues on/off that his memory is just not as good as it should be.  Pt does not feel any memory issues.  Maternal Grandfather had dementia . Mother ? Dementia in mid 61s.   No other relatives known.  No headache, chest pain , n/v/d, speech/visual changes Last labs in 10/13 w/ A1C at 7.7 otherwise no significant changes  MMSE>26 /30 -poor recall  Clock draw abn  >refer to neuro   01/17/13 Acute OV  Complains of dysuria, increased frequency, dark in color, strong odor x2 weeks - denies f/c/s, back pain. No flank pain, fever , n/v/d, or hematuria. No polydipisia. Does have polyuria and urgency. Symptoms worse for last 2 days. UA today shows signs of UTI . Large glucose in urine.  Does not check BS at home on regular basis.  BS today in office 266. We discussed his DM control . He is on max therapy and assures me he is taking meds correctly but eats what he wants.  We discussed he need to return for fasting labs.   Neurology consult 07/11/12 with mild cognitive impairment , advised to manage risk factors with LDL goal and HgA1c goal . MRI with chronic changes . MRA showed sign atherosclerosis, mild right carotid stenosis.   Pt last seen in 04/2012 , did not keep follow up appointment with Dr. Kriste Basque  With labs.   Patient denies any chest pain, orthopnea, PND, leg swelling, rash developing, bloody stools >>Cipro rx   01/19/13 Follow up for DM  Seen last ov with UTI . Urine showed high glucose.  A1C trending up at 9.1 despite taking maximum oral regimen.  We discussed insulin and he has agreed to begin Lantus daily .  Went over diet and wt loss goals and he agrees to referral to DM dietician .  Urinary urgency is improved,  continues on ciprol.  No fever, chest pain , orthopnea or edema, skin ulcers.    Problem List:     OBSTRUCTIVE SLEEP APNEA (ICD-327.23) - s/p split night sleep study 11/08- showing an AHI of 19, assoc w/ an O2 sat down to 78%... during the CPAP trial he titrated up to a pressure of 11cm but never ret to REM sleep so optimal pressure is still ???...he uses [facility] for his CPAP needs... ~  12/10: he tells me that he has stopped the CPAP, that he sleeps better without it, not snoring/ rests well, & wakes feeling refreshed... he refuses Sleep Med re-eval etc...  Hx of HYPERTENSION - not currently on medications...  ~  6/12: BP= 136/80 off meds & denies HA, visual changes, CP, palipit, dizziness, syncope, dyspnea, edema, etc... he states that home BP checks are "OK". ~  1/13: he was sent home from the NH/ Rehab on Lisinopril & Lasix; both stopped 1/13 by TP w/ renal insuffic & elev K+; subseq improved & BP stable... ~  2/13: BP= 112/68 & feeling weak etc...  3/13:  BP= 140/72 & feeling some better... ~  6/13:  BP= 138/78 & feeling much better; denies CP, palpit, ch in SOB or edema... ~  10/13:  BP= 140/82 & he is c/o  reflux & dysphagia today, no CP/ palpit/ SOB/ edema...  CEREBROVASCULAR DISEASE (ICD-437.9) - stable on ASA 325mg /d & PLAVIX 75mg /d per DrDeveshwar/ DrLawson/ DrReynolds... he has severe extracranial cerebrovasc disease- esp in the post circulation, and he is s/p vertebral art angioplasty x 2, and w/ a known basilar art occlusion- prev on Coumadin controlled by Neurology (switched to ASA/ Plavix in 2010)... also followed by Windell Moulding and DrDeveshwar for IR... he had a left CAE in 6/00 by [facility]... ~  CDoppler 2/07 showed 0-39% bilat ICA stenoses, s/p left CAE w/ DPA... ~  eval 3/10 by Windell Moulding w/ CDopplers that showed similar patency w/ 0-39% bilat ICA stenoses; due to the 10 yr interval DrLawson did not feel he needed any additional follow up. ~  12/10: continued f/u  DrDevershwar IR w/ MRI/ MRAs showing bilat carotid siphon region atherosclerosis & narrowing (unchanged), and markedly diseased distal left vertebral & prox basilar art narrowing (no change)... ~  11/12: CTA head & neck showed similar severe dis w/ mod to severe bilat cavernous ICA stenoses due to extensive atherosclerosis; severe distal vertebral atherosclerosis- distal right occluded, distal left w/ high grade stenosis; mod to severe bilat PCA stenoses; chr cerebellar infarcts, no acute infarcts.  VENOUS INSUFFICIENCY (ICD-459.81) - he knows to avoid sodium, elevate legs, wear support hose...  HYPERCHOLESTEROLEMIA (ICD-272.0) - on CRESTOR 10mg /d since Hosp 12/12... ~  FLP 6/08 showed TChol 153, TG 120, HDL 36, LDL 93 ~  FLP 8/09 showed TChol 131, TG 84, HDL 38, LDL 76 ~  2010:  pt was not fasting for blood work... rec to continue Crestor10 + diet efforts. ~  FLP 10/11 showed TChol 210, TG 161, HDL 40, LDL 149... rec to start Crestor 20mg /d, he will decide. ~  FLP 6/12 on Cres20 showed TChol 106, TG 95, HDL 41, LDL 46... Continue same. ~  FLP 12/12 in Hosp (on Cres20 PTA) showed TChol 100, TG 101, HDL 37, LDL 43... They decr to Cres10. ~  FLP 3/13 on Cres10 showed TChol 118, TG 67, HDL 44, LDL 61... Continue same.  DIABETES MELLITUS (ICD-250.00) - on METFORMIN 500mg - 2Bid, GLYBURIDE 5mg Bid, ACTOS 30mg /d, & ONGLYZA 5mg /d... ~  labs 6/08 showed BS= 155, HgA1c= 8.1.Marland Kitchen. (glyburide incr to 5Bid). ~  labs 11/08 showed BS= 352, HgA1c= 8.1.... (Actos added). ~  labs 8/09 showed BS= 128, HgA1c= 7.1.Marland KitchenMarland Kitchen rec- same meds, better diet, get wt down! ~  labs 2/10 showed BS= 249, A1c= 7.3.... Metform incr to 2Bid. ~  labs 12/10 (nonfasting- on all 3 meds) showed BS= 87, A1c= 6.9 ~  labs 10/11 on Metform1000Bid+Gybur5Bid showed BS= 180, A1c= 8.4.Marland KitchenMarland Kitchen rec to incr GLYBUR10Bid + diet. ~  labs 6/12 on Metform1000Bid+Glybur10Bid showed BS= 196, A1c= 11.2 ?what happened?> he declined insulin & Endocrine consult, opted for  max oral meds by adding ACTOS 30mg /d & ONGLYZA 5mg /d... ~  Labs 12/12 in [place] showed BS 150-200+ and A1c= 7.2; covered w/ SSI in hosp & placed back on 4 oral meds on disch from rehab... ~  Labs 1/13 on all 4 meds showed BS= 230, A1c=7.1; we reviewed diet & he will start PT soon... ~  Labs 3/13 on showed BS= 72, A1c= 6.6 ~  4/13: pt called w/ hypoglycemia & we decreased Glyburide 5mg  from 2Bid to 1Bid... ~  6/13:  Pt on Metform500-2Bid, Glybur5mg Bid, Actos30, Onglyza5; BS at home 70-90 he says & still drinking Boost; Labs showed BS=190  A1c=7.4  ; we decided to cut the Glyburide5mg  Qam only but  keep the Metform500-2Bid +Actos +Onglyza... ~  10/13: on his 41meds> BS=171, A1c=7.7 but he's gained 16#; told to STOP the boost, get on diet, get wt down, take all meds every day...  OVERWEIGHT (ICD-278.02) - he states that he is not on much of a diet & we reviewed low carb/ no sweets/ low fat diet. ~  weight 8/09 = 216# ~  weight 2/10 = 216# ~  weight 12/10 = 212# ~  weight 10/11 = 210# ~  Weight 6/12 = 206# and he admits to not being on much of a diet... ~  Weight 2/13 = non weight bearing still... ~  Weight 3/13 = 193# ~  Weight 6/13 = 191# ~  Weight 10/13 = 206#  GERD (ICD-530.81) - prev on Nexium40, he switched to OTC Prilosec, now PROTONIX 40mg /d due to Plavix Rx; pt had stopped this on his own & asked to restart 10/13. ~  EGD 19/03 showed 4cmHH, GERD & esoph stricture dilated... ~  EGD 12/09 by DrPatterson showed HH & severe erosive esophagitis from GERD... ~  10/13: presents w/ incr reflux- w/ dysphagia & food sticking in mid-esoph that occurs every 2-3days & has to vomit to feel better; he had stopped his PPI on his own (?why?), rec to restart the Protonix40 & we will refer to GI for EGD & dilatation...  DIVERTICULOSIS OF COLON (ICD-562.10) - note: he was hosp in [place] briefly 12/09 for fecal impaction; prev on Miralax & Colase but recently noted loose stools on the Metformin  1000mg Bid... ~  colonoscopy 10/03 by DrPatterson showed divertics only... ~  colonoscopy 12/09 by DrPatterson was also normal x for divertics...  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 Hx of RENAL CELL CANCER (ICD-189.0) - he had a partial nephrectomy of the right side of his horseshoe kidney in 1994 due to renal cell ca... still followed by DrPeterson yearly by his hx but we don't have any recent notes... ~  labs 10/11 showed PSA= 0.95 ~  Labs 3/13 showed BUN=21, Creat=1.1, PSA=2.75 ~  Labs 10/13 showed BUN= 25, Creat= 1.2  Hx of NEPHROLITHIASIS (ICD-592.0) - remote hx of kidney stones followed by DrPeterson...  DEGENERATIVE JOINT DISEASE (ICD-715.90) - and elbow tendonitis Rx'd OTC Prn...  GOUT (ICD-274.9) - on ALLOPURINOL 300mg /d... ~  labs 10/11 showed URIC= 4.7  BACK PAIN, LUMBAR (ICD-724.2) - known degenerative spondylitic disease at L4-5 and L5-S1 prev followed by [facility] (now DrElsner & DrHarkins)... ~  6/12: he reports further back pain w/ eval by Chiropractor showing L4 disc prob & regular adjustments; but now notes incr pain & he requests referral to Neurosurg for further eval ==> seen by DrElsner, then DrHarkins for shots which has helped...  ANXIETY (ICD-300.00) - uses ALPRAZOLAM 0.5mg  as needed.   Past Surgical History  Procedure Laterality Date  . Incisional hernia repair  1/96    Dr.Leone  . Nasal sinus surgery  12/96    Dr.Redman  . Cholecystectomy  9/04    Dr. Maryagnes Amos  . 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 Carotid endarterectomy  09/2000    Dr.Lawson  . Veterbral art angioplasty      x2  . Small intestine surgery    . External fixation leg  03/10/2011    Procedure: EXTERNAL FIXATION LEG;  Surgeon: Budd Palmer;  Location: [facility] OR;  Service: Orthopedics;  Laterality: Right;  . Orif tibia  plateau  03/24/2011    Procedure: OPEN REDUCTION INTERNAL FIXATION (ORIF) TIBIAL  PLATEAU;  Surgeon: Budd Palmer;  Location: [facility] OR;  Service: Orthopedics;  Laterality: Right;    Outpatient Encounter Prescriptions as of 01/19/2013  Medication Sig Dispense Refill  . allopurinol (ZYLOPRIM) 300 MG tablet Take 1 tablet (300 mg total) by mouth daily.  90 tablet  3  . ALPRAZolam (XANAX) 0.5 MG tablet TAKE ONE-HALF TO ONE TABLET BY MOUTH THREE TIMES DAILY AS NEEDED FOR NERVES  90 tablet  5  . aspirin 325 MG EC tablet Take 325 mg by mouth daily.        Marland Kitchen b complex vitamins tablet Take 1 tablet by mouth daily.      . cholecalciferol (VITAMIN D) 1000 UNITS tablet Take 1,000 Units by mouth daily.      . ciprofloxacin (CIPRO) 500 MG tablet Take 1 tablet (500 mg total) by mouth 2 (two) times daily.  20 tablet  0  . clopidogrel (PLAVIX) 75 MG tablet Take 1 tablet (75 mg total) by mouth daily.  90 tablet  3  . glyBURIDE (DIABETA) 5 MG tablet TAKE 1 TABLET (5 MG TOTAL) BY MOUTH DAILY WITH BREAKFAST.  90 tablet  0  . metFORMIN (GLUCOPHAGE) 500 MG tablet Take 2 tablets by mouth two times daily  360 tablet  3  . Multiple Vitamin (MULTIVITAMIN) tablet Take 1 tablet by mouth daily.        . pantoprazole (PROTONIX) 40 MG tablet Take 40 mg by mouth daily.      . saxagliptin HCl (ONGLYZA) 5 MG TABS tablet Take 1 tablet (5 mg total) by mouth daily.  90 tablet  3  . simvastatin (ZOCOR) 40 MG tablet Take 1 tablet (40 mg total) by mouth at bedtime.  90 tablet  3  . traMADol (ULTRAM) 50 MG tablet Take 50 mg by mouth every 6 (six) hours as needed for pain.       . Insulin Glargine (LANTUS SOLOSTAR) 100 UNIT/ML SOPN Inject 10 Units into the skin at bedtime.  3 mL  5  . Insulin Glargine (LANTUS SOLOSTAR) 100 UNIT/ML SOPN Inject 10 Units into the skin at bedtime.  3 mL  0  . Insulin Pen Needle 31G X 8 MM MISC Use with Lantus Solostar Pen once daily as directed  100 each  5  . [DISCONTINUED] pioglitazone (ACTOS) 30 MG tablet Take 1 tablet (30 mg total) by mouth daily.  90 tablet  3   No  facility-administered encounter medications on file as of 01/19/2013.    Allergies  Allergen Reactions  . Dilaudid [Hydromorphone Hcl]   . Promethazine Hcl     REACTION: hallucinations    Current Medications, Allergies, Past Medical History, Past Surgical History, Family History, and Social History were reviewed in [employer] record.    Review of Systems        Constitutional:   No  weight loss, night sweats,  Fevers, chills,  +fatigue, or  lassitude.  HEENT:   No headaches,  Difficulty swallowing,  Tooth/dental problems, or  Sore throat,                No sneezing, itching, ear ache, nasal congestion, post nasal drip,   CV:  No chest pain,  Orthopnea, PND, swelling in lower extremities, anasarca, dizziness, palpitations, syncope.   GI  No heartburn, indigestion, abdominal pain, nausea, vomiting, diarrhea, change in bowel habits, loss of appetite, bloody stools.   Resp:  No shortness of breath with exertion or at rest.  No excess mucus, no productive cough,  No non-productive cough,  No coughing up of blood.  No change in color of mucus.  No wheezing.  No chest wall deformity  Skin: no rash or lesions.  GU: + dysuria, change in color of urine,  urgency or frequency.  No flank pain, no hematuria   MS:  No joint pain or swelling.  No decreased range of motion.  No back pain.  Psych:  No change in mood or affect. No depression or anxiety.          Objective:   Physical Exam     WD, WN, 72 y/o WM in NAD...  GENERAL:  Alert & oriented; pleasant & cooperative... HEENT:  [place]/AT, EOM-wnl, PERRLA, EACs-clear, TMs-wnl, NOSE-clear, THROAT-clear & wnl. NECK:  Supple w/ fairROM; no JVD; s/p left CAE, +bruits; no thyromegaly or nodules palpated; no lymphadenopathy. CHEST:  Clear to P & A; without wheezes/ rales/ or rhonchi heard... HEART:  Regular Rhythm; gr 1/6 SEM without rubs or gallops detected... ABDOMEN:  Soft & nontender; normal bowel sounds; no  organomegaly or masses palpated...neg CVA tenderness  EXT: right knee deformity s/p ORIF, mod arthritic changes; no varicose veins/ +venous insuffic/ tr+ edema. NEURO:  CN's intact; no focal neuro deficits...nml gait  DERM:  No lesions noted; no rash etc...no ulcers noted.  Feet exam clear , + sensation       Assessment & Plan:

## 2013-01-25 NOTE — Assessment & Plan Note (Addendum)
Uncontrolled DM -A1C trending up  Insulin lantus injection taught with return demonstration   Plan  Refer to DM teaching /dietician at [facility].  Check Blood sugars daily before eating and 1 x week 2 hours after eating.  Keep log of BS and bring to next visit.  Low sweet diet.  Stop Actos.  Begin Lantus 10units At bedtime  .  follow up Dr. Kriste Basque  In 4 weeks and As needed   Please contact office for sooner follow up if symptoms do not improve or worsen or seek emergency care  Continue with eye exam yearly.  Flu shot

## 2013-02-22 ENCOUNTER — Ambulatory Visit: Payer: Medicare Other

## 2013-02-23 ENCOUNTER — Ambulatory Visit (INDEPENDENT_AMBULATORY_CARE_PROVIDER_SITE_OTHER): Payer: Medicare Other | Admitting: Pulmonary Disease

## 2013-02-23 ENCOUNTER — Encounter: Payer: Self-pay | Admitting: Pulmonary Disease

## 2013-02-23 ENCOUNTER — Other Ambulatory Visit (INDEPENDENT_AMBULATORY_CARE_PROVIDER_SITE_OTHER): Payer: Medicare Other

## 2013-02-23 VITALS — BP 140/86 | HR 68 | Temp 97.7°F | Ht 66.0 in | Wt 198.2 lb

## 2013-02-23 DIAGNOSIS — E119 Type 2 diabetes mellitus without complications: Secondary | ICD-10-CM

## 2013-02-23 DIAGNOSIS — Q638 Other specified congenital malformations of kidney: Secondary | ICD-10-CM

## 2013-02-23 DIAGNOSIS — M545 Low back pain: Secondary | ICD-10-CM

## 2013-02-23 DIAGNOSIS — K219 Gastro-esophageal reflux disease without esophagitis: Secondary | ICD-10-CM

## 2013-02-23 DIAGNOSIS — R413 Other amnesia: Secondary | ICD-10-CM

## 2013-02-23 DIAGNOSIS — E7800 Pure hypercholesterolemia, unspecified: Secondary | ICD-10-CM

## 2013-02-23 DIAGNOSIS — K5730 Diverticulosis of large intestine without perforation or abscess without bleeding: Secondary | ICD-10-CM

## 2013-02-23 DIAGNOSIS — M1990 Unspecified osteoarthritis, unspecified site: Secondary | ICD-10-CM

## 2013-02-23 DIAGNOSIS — F411 Generalized anxiety disorder: Secondary | ICD-10-CM

## 2013-02-23 DIAGNOSIS — I679 Cerebrovascular disease, unspecified: Secondary | ICD-10-CM

## 2013-02-23 DIAGNOSIS — I10 Essential (primary) hypertension: Secondary | ICD-10-CM

## 2013-02-23 DIAGNOSIS — I872 Venous insufficiency (chronic) (peripheral): Secondary | ICD-10-CM

## 2013-02-23 DIAGNOSIS — C649 Malignant neoplasm of unspecified kidney, except renal pelvis: Secondary | ICD-10-CM

## 2013-02-23 DIAGNOSIS — I6529 Occlusion and stenosis of unspecified carotid artery: Secondary | ICD-10-CM

## 2013-02-23 DIAGNOSIS — N289 Disorder of kidney and ureter, unspecified: Secondary | ICD-10-CM

## 2013-02-23 LAB — BASIC METABOLIC PANEL
BUN: 29 mg/dL — ABNORMAL HIGH (ref 6–23)
Calcium: 9.8 mg/dL (ref 8.4–10.5)
Chloride: 103 mEq/L (ref 96–112)
Creatinine, Ser: 1.2 mg/dL (ref 0.4–1.5)
Glucose, Bld: 182 mg/dL — ABNORMAL HIGH (ref 70–99)

## 2013-02-23 LAB — HEMOGLOBIN A1C: Hgb A1c MFr Bld: 8.3 % — ABNORMAL HIGH (ref 4.6–6.5)

## 2013-02-23 NOTE — Patient Instructions (Signed)
Today we updated your med list in our EPIC system...    Continue your current medications the same...  Today we rechecked your Diabetic labs...    We will contact you w/ the results when available...   Be sure to take your meds regularly and check your fasting blood sugar often...  Remember the importance of diet!!! NO SUGARS & NO SWEETS  Call for any questions...  Let's plan a follow up visit in 47mo, sooner if needed for problems.Marland KitchenMarland Kitchen

## 2013-02-23 NOTE — Progress Notes (Signed)
Subjective:    Patient ID: Peter Nicholson, male    DOB: [birth year], 72 y.o.   MRN: [id number]  HPI 72 y/o WM here for a follow up visit... he has multiple medical problems as noted below...   ~  May 15, 2011:  32mo ROV & post hosp check> He was [facility] 11/26 - 03/27/11 after fall from ladder cleaning gutters w/ complex right tibial plat fx- ORIF, 2 operations by DrHardy, & hosp course complic by ileus, TIA, electrolyte abn, etc;  Event disch to Rehab & he was attended by [facility];  Mult med changes & adjustments ==> we had a difficult time w/ med reconciliation but have the current list now up to date & accurate...    When last seen 6/12 his DM control was poor & A1c=11.2 on Metform1000Bid+Glybur10Bid; he was offered insulin vs Endocrine consult but he opted for max oral meds & Actos30+Onglyza5 added; he had no f/u visits prior to his 11/12 [facility] for fractured leg> in hosp BSs ~150-200+ and A1c=7.2; covered w/ SSI==> to nursing home rehab; we don't have rehab notes but DrGates disch pt on same prev & 1/13 f/u w/ TP showed BS~230 & A1c=7.1; we discussed better diet, get wt down, & same 4 meds for now...    He had a TIA while off his ASA/ Plavix in the perioperative period; he was placed on Lovenox & continued this in the NH but his Plavix has not yet been restarted; pt instructed to restart the Plavix & continue his ASA (dual med converage w/ his severe vasc dis)...    Also had UTI after disch & saw TP w/ culture +Enterococcus, sens Levaquin, treated & symptoms resolved;  Also noted to have renal insuffic w/ BUN=43, Creat=2.3, K=5.4;  Lisinopril/ Lasix/ KCl- all stopped & f/u improved...    Ortho f/u DrHardy due soon; he is still non-weight bearing since his 2 operations (1st- external fixation, 2nd- ORIF) but anticipating start of phys therapy soon...    He has persistent pain in right hip area- referred from back & prev responded to shots from DrElsner/ Ollen Bowl; he will call for follow up...     Wife feels that he is depressed & wants him on medication; he laughs & does not agree (thinks he is more anxious) so we settled on Zoloft 50mg /d as trial...    Note: spent w/ pt/ wife/ reviewing Hosp records/ subseq labs/ med reconciliation/ & counseling...   ~  June 29, 2011:  6wk ROV & he has some persist leg discomfort & due to f/u w/ DrHandy soon; he's noted some mild swelling & we reviewed no salt, elevation, TEDs;  We also reviewed his medical problems, medications, XRays, & LABS today> see prob list below>>  LABS 3/13:  FLP- at goals on Cres10;  Chems- all wnl & A1c=6.6 on ; CBC- Hg=11.5;  TSH=3.51;  VitD= 39;  PSA=2.75  ~  September 29, 2011:  70mo ROV & Peter Nicholson called [place] in the interval since his last visit w/ some hypoglycemia- BS down to 40's at night & he decreased his Metformin & Glyburide from 2Bid to 1Bid; he was also drinking Boost up to 3 cans daily; since the adjustments his sugars have normalized w/ FBS in the 70-90 range & no hypoglycemia but still doing the Boost supplements Qhs; his weight is down 3# to 191# & he is asked to stop the Boost for now & decr the Glyburide to 5mg  Qam only- labs pending today for  further adjustments...    We reviewed prob list, meds, xrays and labs> see below >> LABS 6/18:  Chems- BS=190 A1c=7.4 and we decided to decr the Glyburide to 5mg Qam only, but keep the Metform500-2Bid & same Actos & Onglyza...  ~  January 18, 2012:  64mo ROV & unfortunately Peter Nicholson has gained 16# up to 206# & we reviewed low carb, low fat diet & exercise program needed to lose the weight;  BS is up to 171 & A1c up to 7.7;  We reiterated his important regular med regimen w/ Metform500-2Bid, Glyburide5Qam, Actos30, Onglyza5...  His CC today is incr reflux- w/ dysphagia & food sticking in mid-esoph that occurs every 2-3days & has to vomit to feel better "I quit eating at the cafe across the street"; he had stopped his PPI- Protonix on his own (?why?), PMHx indicates hx 4cmHH, erosive  esoph in 2009 & stricture dilated in 2003; rec to restart the Protonix40 & we will refer to GI for EGD & dilatation...    We reviewed prob list, meds, xrays and labs> see below for updates >> OK Flu shot today. LABS 10/13:  Chems- ok x BS=171 A1c=7.7;  CBC- Hg=12.6.Marland Kitchen.   ~  February 23, 2013:  61mo ROV & Peter Nicholson's CC is LBP & he is followed by DrElsner & DrHarkins for [facility] injections, last ?[id number] & he will call for f/u eval; he has Tramadol for pain... We reviewed the following medical problems during today's office visit >>     OSA> he's been off CPAP x yrs & denies sleep disordered breathing, denies snoring, feels he rests well, & wakes refreshed w/o daytime hypersomnolence...    Borderline HBP> not currently on meds, just diet controlled; BP= 140/86 & reminded to avoid sodium & get weight down; he is essentially asymptomatic w/o CP, palpit, SOB, edema, etc...    Cerebrovasc dis> on ASA81 & Plavix75; known severe extracranial cerebrovasc dis w/ left CAE 2000 by DrLawson & known severe bilat cavernous ICA stenoses; he is also s/p vertebral art angioplasty x2 by DrTDeveshwar w/ known basilar art occlusion;     Chol> on Simva40 + diet; FLP 10/14 showed TChol 136, TG 54, HDL 49, LDL 77... Continue same.    DM> on Lantus10u, Metform500-2Bid, Diabeta5, Onglyza5, off Actos; Labs 10/14 showed BS=252, A1c=9.1 & Lantus added; f/u labs 11/14 showed BS=182, A1c=8.3; continue same, get wt down.    Overweight> weight = 198# and BMI~33; we reviewed diet, exercise, wt reduction strategies...    GI- GERD, divertics> on Protonix40 daily; he had EGD 11/13 showing HH, chr GERD, stricture dil w/ 49F Maloney dilator, erosive gastritis was HPylori neg & treated w/ daily PPI rx.    GU- Hx horseshoe kidney, renal cell cancer, nephrolithiasis> right side of horseshoe kid removed 1994 (RCCa); followed by Urology for yrs & no recurrence; Creat in the 1.2-1.5 range; PSA in the 1-3 range...    DJD, Gout, LBP> on Allopurinol300,  Tramadol50, Nortrip ?25mg ; followed by DrElsner & DrHarkins on ESI injections & Nortrip?dose, last ?[id number] & he will call for f/u eval...    Neuro- c/o memory impairment> on ASA81 & Plavix75, plus MVI etc; he has severe intracranial atherosclerotic dis and chr cerebellar infarcts on prev scans; more recent memory problems felt to be mild cognitive impairment by DrSethi on eval 3/14 (but he is clearly high risk for vascular dementia problems)...     Anxiety> on Xanax0.5 prn...  We reviewed prob list, meds, xrays and labs> see below for updates >>  he had the 2014 flu vaccine in Oct... Pt & wife reminded that he needs more reg medical follow up visits. LABS 10/14 in Epic> REVIEWED.Marland KitchenMarland Kitchen  LABS 11/14:  Chems- OK x BS=182, A1c=8.3, BUN=29, Cr=1.2.Marland KitchenMarland Kitchen           Problem List:     OBSTRUCTIVE SLEEP APNEA (ICD-327.23) - s/p split night sleep study 11/08- showing an AHI of 19, assoc w/ an O2 sat down to 78%... during the CPAP trial he titrated up to a pressure of 11cm but never ret to REM sleep so optimal pressure is still ???...he uses [facility] for his CPAP needs... ~  12/10: he tells me that he has stopped the CPAP, that he sleeps better without it, not snoring/ rests well, & wakes feeling refreshed... he refuses Sleep Med re-eval etc... ~  11/14: he's been off CPAP x yrs & denies sleep disordered breathing, denies snoring, feels he rests well, & wakes refreshed w/o daytime hypersomnolence.   Hx of HYPERTENSION - not currently on medications...  ~  6/12: BP= 136/80 off meds & denies HA, visual changes, CP, palipit, dizziness, syncope, dyspnea, edema, etc... he states that home BP checks are "OK". ~  1/13: he was sent home from the NH/ Rehab on Lisinopril & Lasix; both stopped 1/13 by TP w/ renal insuffic & elev K+; subseq improved & BP stable... ~  2/13: BP= 112/68 & feeling weak etc...  3/13:  BP= 140/72 & feeling some better... ~  6/13:  BP= 138/78 & feeling much better; denies CP,  palpit, ch in SOB or edema... ~  10/13:  BP= 140/82 & he is c/o reflux & dysphagia today, no CP/ palpit/ SOB/ edema... ~  11/14: not currently on meds, just diet controlled; BP= 140/86 & reminded to avoid sodium & get weight down; he is essentially asymptomatic w/o CP, palpit, SOB, edema, etc.  CEREBROVASCULAR DISEASE (ICD-437.9) - stable on ASA 325mg /d & PLAVIX 75mg /d per DrDeveshwar/ DrLawson/ DrReynolds... he has severe extracranial cerebrovasc disease- esp in the post circulation, and he is s/p vertebral art angioplasty x 2, and w/ a known basilar art occlusion- prev on Coumadin controlled by Neurology (switched to ASA/ Plavix in 2010)... also followed by Windell Moulding and DrDeveshwar for IR... he had a left CAE in 6/00 by [facility]... ~  CDoppler 2/07 showed 0-39% bilat ICA stenoses, s/p left CAE w/ DPA... ~  eval 3/10 by Windell Moulding w/ CDopplers that showed similar patency w/ 0-39% bilat ICA stenoses; due to the 10 yr interval DrLawson did not feel he needed any additional follow up. ~  12/10: continued f/u DrDevershwar IR w/ MRI/ MRAs showing bilat carotid siphon region atherosclerosis & narrowing (unchanged), and markedly diseased distal left vertebral & prox basilar art narrowing (no change)... ~  11/12: CTA head & neck showed similar severe dis w/ mod to severe bilat cavernous ICA stenoses due to extensive atherosclerosis; severe distal vertebral atherosclerosis- distal right occluded, distal left w/ high grade stenosis; mod to severe bilat PCA stenoses; chr cerebellar infarcts, no acute infarcts. ~  11/14: on ASA81 & Plavix75; known severe extracranial cerebrovasc dis w/ left CAE 2000 by DrLawson & known severe bilat cavernous ICA stenoses; he is also s/p vertebral art angioplasty x2 by DrTDeveshwar w/ known basilar art occlusion.  VENOUS INSUFFICIENCY (ICD-459.81) - he knows to avoid sodium, elevate legs, wear support hose...  HYPERCHOLESTEROLEMIA (ICD-272.0) - on CRESTOR 10mg /d since Hosp  12/12... ~  FLP 6/08 showed TChol 153, TG 120, HDL 36,  LDL 93 ~  FLP 8/09 showed TChol 131, TG 84, HDL 38, LDL 76 ~  2010:  pt was not fasting for blood work... rec to continue Crestor10 + diet efforts. ~  FLP 10/11 showed TChol 210, TG 161, HDL 40, LDL 149... rec to start Crestor 20mg /d, he will decide. ~  FLP 6/12 on Cres20 showed TChol 106, TG 95, HDL 41, LDL 46... Continue same. ~  FLP 12/12 in Hosp (on Cres20 PTA) showed TChol 100, TG 101, HDL 37, LDL 43... They decr to Cres10. ~  FLP 3/13 on Cres10 showed TChol 118, TG 67, HDL 44, LDL 61... Continue same, later switched to Simva40 for $$ reasons. ~  FLP 10/14 on Simva40 showed TChol 136, TG 54, HDL 49, LDL 77... Continue same.  DIABETES MELLITUS (ICD-250.00) - on METFORMIN 500mg - 2Bid, GLYBURIDE 5mg Bid, ACTOS 30mg /d, & ONGLYZA 5mg /d... ~  labs 6/08 showed BS= 155, HgA1c= 8.1.Marland Kitchen. (glyburide incr to 5Bid). ~  labs 11/08 showed BS= 352, HgA1c= 8.1.... (Actos added). ~  labs 8/09 showed BS= 128, HgA1c= 7.1.Marland KitchenMarland Kitchen rec- same meds, better diet, get wt down! ~  labs 2/10 showed BS= 249, A1c= 7.3.... Metform incr to 2Bid. ~  labs 12/10 (nonfasting- on all 3 meds) showed BS= 87, A1c= 6.9 ~  labs 10/11 on Metform1000Bid+Gybur5Bid showed BS= 180, A1c= 8.4.Marland KitchenMarland Kitchen rec to incr GLYBUR10Bid + diet. ~  labs 6/12 on Metform1000Bid+Glybur10Bid showed BS= 196, A1c= 11.2 ?what happened?> he declined insulin & Endocrine consult, opted for max oral meds by adding ACTOS 30mg /d & ONGLYZA 5mg /d... ~  Labs 12/12 in [place] showed BS 150-200+ and A1c= 7.2; covered w/ SSI in hosp & placed back on 4 oral meds on disch from rehab... ~  Labs 1/13 on all 4 meds showed BS= 230, A1c=7.1; we reviewed diet & he will start PT soon... ~  Labs 3/13 on showed BS= 72, A1c= 6.6 ~  4/13: pt called w/ hypoglycemia & we decreased Glyburide 5mg  from 2Bid to 1Bid... ~  6/13:  Pt on Metform500-2Bid, Glybur5mg Bid, Actos30, Onglyza5; BS at home 70-90 he says & still drinking Boost; Labs showed  BS=190  A1c=7.4  ; we decided to cut the Glyburide5mg  Qam only but keep the Metform500-2Bid +Actos +Onglyza... ~  10/13: on his 70meds> BS=171, A1c=7.7 but he's gained 16#; told to STOP the boost, get on diet, get wt down, take all meds every day... ~  10/14: seen by TP> Labs 10/14 showed BS=252, A1c=9.1 & Lantus added to his Metform500-2Bid, Diabeta5, Onglyza5; Lantus10u added... ~  11/14: on Lantus10u, Metform500-2Bid, Diabeta5, Onglyza5, off Actos; f/u labs 11/14 showed BS=182, A1c=8.3; continue same, get wt down.  OVERWEIGHT (ICD-278.02) - he states that he is not on much of a diet & we reviewed low carb/ no sweets/ low fat diet. ~  weight 8/09 = 216# ~  weight 2/10 = 216# ~  weight 12/10 = 212# ~  weight 10/11 = 210# ~  Weight 6/12 = 206# and he admits to not being on much of a diet... ~  Weight 2/13 = non weight bearing still... ~  Weight 3/13 = 193# ~  Weight 6/13 = 191# ~  Weight 10/13 = 206# ~  Weight 11/14 = 198#  GERD (ICD-530.81) - prev on Nexium40, he switched to OTC Prilosec, now PROTONIX 40mg /d due to Plavix Rx; pt had stopped this on his own & asked to restart 10/13. ~  EGD 19/03 showed 4cmHH, GERD & esoph stricture dilated... ~  EGD 12/09 by  DrPatterson showed HH & severe erosive esophagitis from GERD... ~  10/13: presents w/ incr reflux- w/ dysphagia & food sticking in mid-esoph that occurs every 2-3days & has to vomit to feel better; he had stopped his PPI on his own (?why?), rec to restart the Protonix40 & we will refer to GI for EGD & dilatation... ~  10/13: he saw DrPatterson> EGD 11/13 showed HH, chr GERD, stricture dil w/ 49F Maloney dilator, erosive gastritis was HPylori neg & treated w/ daily PPI rx...   DIVERTICULOSIS OF COLON (ICD-562.10) - note: he was hosp in [place] briefly 12/09 for fecal impaction; prev on Miralax & Colase but recently noted loose stools on the Metformin 1000mg Bid... ~  colonoscopy 10/03 by DrPatterson showed divertics only... ~  colonoscopy  12/09 by DrPatterson was also normal x for divertics...  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 Hx of RENAL CELL CANCER (ICD-189.0) - he had a partial nephrectomy of the right side of his horseshoe kidney in 1994 due to renal cell ca... still followed by DrPeterson yearly by his hx but we don't have any recent notes... ~  labs 10/11 showed PSA= 0.95 ~  Labs 3/13 showed BUN=21, Creat=1.1, PSA=2.75 ~  Labs 10/13 showed BUN= 25, Creat= 1.2 ~  Labs 10/14 showed BUN=38, Cr=1.5;  Labs 11/14 showed BUN=29, Cr=1.2  Hx of NEPHROLITHIASIS (ICD-592.0) - remote hx of kidney stones followed by DrPeterson...  DEGENERATIVE JOINT DISEASE (ICD-715.90) &  GOUT (ICD-274.9) - on ALLOPURINOL 300mg /d... ~  labs 10/11 showed URIC= 4.7 ~  He was [facility] 11/26 - 03/27/11 after fall from ladder cleaning gutters w/ complex right tibial plat fx- ORIF, 2 operations by DrHardy, & hosp course complic by ileus, TIA, electrolyte abn, etc;  Event disch to Rehab & he was attended by [facility]...  BACK PAIN, LUMBAR (ICD-724.2) - known degenerative spondylitic disease at L4-5 and L5-S1 prev followed by [facility] for Neurosurg (now DrElsner & DrHarkins)... ~  6/12: he reports further back pain w/ eval by Chiropractor showing L4 disc prob & regular adjustments; but now notes incr pain & he requests referral to Neurosurg for further eval ==> seen by DrElsner, then DrHarkins for shots which has helped... ~  2014: he has continued f/u w/ DrHarkins/ [facility] & received periodic ESI injections for his Lumbar DDD; they added NORTRIPTYLINE 25mg Qhs & slowly incr the dose  NEURO >> Hx Left Hemispheric TIA in 11/12 & MEMORY IMPAIRMENT ~  3/14:  He had Neuro eval DrSethi> he did Labs, EEG, MRI Brain, & MRA Brain & Neck; Rec to take 2 FishOil caps daily, CerefolinNAC daily, work puzzles etc, continue Plavix & strict control of BP, Chol, DM; they did not start Aricept...  ANXIETY (ICD-300.00) - uses  ALPRAZOLAM 0.5mg  as needed.   Past Surgical History  Procedure Laterality Date  . Incisional hernia repair  1/96    Dr.Leone  . Nasal sinus surgery  12/96    Dr.Redman  . Cholecystectomy  9/04    Dr. Maryagnes Amos  . 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 Carotid endarterectomy  09/2000    Dr.Lawson  . Veterbral art angioplasty      x2  . Small intestine surgery    . External fixation leg  03/10/2011    Procedure: EXTERNAL FIXATION LEG;  Surgeon: Budd Palmer;  Location: [facility] OR;  Service: Orthopedics;  Laterality: Right;  . Orif tibia plateau  03/24/2011  Procedure: OPEN REDUCTION INTERNAL FIXATION (ORIF) TIBIAL PLATEAU;  Surgeon: Budd Palmer;  Location: [facility] OR;  Service: Orthopedics;  Laterality: Right;    Outpatient Encounter Prescriptions as of 02/23/2013  Medication Sig  . allopurinol (ZYLOPRIM) 300 MG tablet Take 1 tablet (300 mg total) by mouth daily.  Marland Kitchen ALPRAZolam (XANAX) 0.5 MG tablet TAKE ONE-HALF TO ONE TABLET BY MOUTH THREE TIMES DAILY AS NEEDED FOR NERVES  . aspirin 325 MG EC tablet Take 325 mg by mouth daily.    Marland Kitchen b complex vitamins tablet Take 1 tablet by mouth daily.  . cholecalciferol (VITAMIN D) 1000 UNITS tablet Take 1,000 Units by mouth daily.  . clopidogrel (PLAVIX) 75 MG tablet Take 1 tablet (75 mg total) by mouth daily.  Marland Kitchen glyBURIDE (DIABETA) 5 MG tablet TAKE 1 TABLET (5 MG TOTAL) BY MOUTH DAILY WITH BREAKFAST.  Marland Kitchen Insulin Glargine (LANTUS SOLOSTAR) 100 UNIT/ML SOPN Inject 10 Units into the skin at bedtime.  . Insulin Glargine (LANTUS SOLOSTAR) 100 UNIT/ML SOPN Inject 10 Units into the skin at bedtime.  . Insulin Pen Needle 31G X 8 MM MISC Use with Lantus Solostar Pen once daily as directed  . metFORMIN (GLUCOPHAGE) 500 MG tablet Take 2 tablets by mouth two times daily  . Multiple Vitamin (MULTIVITAMIN) tablet Take 1 tablet by mouth daily.    . pantoprazole (PROTONIX) 40 MG tablet Take 40 mg by mouth daily.  . saxagliptin HCl  (ONGLYZA) 5 MG TABS tablet Take 1 tablet (5 mg total) by mouth daily.  . simvastatin (ZOCOR) 40 MG tablet Take 1 tablet (40 mg total) by mouth at bedtime.  . traMADol (ULTRAM) 50 MG tablet Take 50 mg by mouth every 6 (six) hours as needed for pain.     Allergies  Allergen Reactions  . Dilaudid [Hydromorphone Hcl]   . Promethazine Hcl     REACTION: hallucinations    Current Medications, Allergies, Past Medical History, Past Surgical History, Family History, and Social History were reviewed in [employer] record.    Review of Systems        See HPI - all other systems neg except as noted...  The patient complains of dyspnea on exertion.  The patient denies anorexia, fever, weight loss, weight gain, vision loss, decreased hearing, hoarseness, chest pain, syncope, peripheral edema, prolonged cough, headaches, hemoptysis, abdominal pain, melena, hematochezia, severe indigestion/heartburn, hematuria, incontinence, muscle weakness, suspicious skin lesions, transient blindness, difficulty walking, depression, unusual weight change, abnormal bleeding, enlarged lymph nodes, and angioedema.     Objective:   Physical Exam     WD, WN, 72 y/o WM in NAD...  GENERAL:  Alert & oriented; pleasant & cooperative... HEENT:  [place]/AT, EOM-wnl, PERRLA, EACs-clear, TMs-wnl, NOSE-clear, THROAT-clear & wnl. NECK:  Supple w/ fairROM; no JVD; s/p left CAE, +bruits; no thyromegaly or nodules palpated; no lymphadenopathy. CHEST:  Clear to P & A; without wheezes/ rales/ or rhonchi heard... HEART:  Regular Rhythm; gr 1/6 SEM without rubs or gallops detected... ABDOMEN:  Soft & nontender; normal bowel sounds; no organomegaly or masses palpated... EXT: right knee deformity s/p ORIF, mod arthritic changes; no varicose veins/ +venous insuffic/ 1+ edema. NEURO:  CN's intact; no focal neuro deficits... DERM:  No lesions noted; no rash etc...  RADIOLOGY DATA:  Reviewed in the EPIC EMR & discussed w/  the patient...    >>CXR 11/12 showed borderline heart size, tortuous aorta, clear & NAD...    >>EKG 12/12 showed NSR, rate92, NSSTTWA...  LABORATORY DATA:  Reviewed in the [facility] EMR & discussed w/ the patient...   Assessment & Plan:    CEREBROVASC Disease & TIA 12/12>  Known severe cerebrovasc dis & he had TIA while in hosp for fx leg (he was off plavix at the time); treated w/ Lovenox perioperatively but he hasn't yet restarted the Plavix; He is w/o cerebral ischemic symptoms on his Rx, s/p left CAE in 2000 & 2 vertebral art angioplasties in 2001 & 2002... NEURO> Mild Cognitive Impairment & he is at risk for vascular dementia; rec to maintain careful medical followup of BP, Chol, DM, & get the weight down!  CHOL>  on Sinva40 now & FLP looks good...  DM>  He has been started on Lantus at 10u daily, continue Metform, Glyburide, Onglyza; get on diet 7 get wt down...  Overweight>  We discussed low carb, low fat wt reducing diet...  GI>  GERD prev controlled w/ Protonix but he stopped on his own (?why?); EGD 11/13 w/ dilatation & reminded to take the Protonix40 every day...  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  DJD/ Hx Gout/ LBP>  He was seeing chiropractor regularly regarding LBP & then f/u w/ DrElsner,Neurosurg & had ESI by DrHarkins & the shots have helped...  s/p fall w/ right Tibial plateaux fx> 1st ext fixation, 2nd ORIF>  By DrHardy, Ortho; exercise is difficult but very much needed; he's had extensive PT training...  Anxiety>  He remains on Alpraz Prn...   Patient's Medications  New Prescriptions   No medications on file  Previous Medications   ALLOPURINOL (ZYLOPRIM) 300 MG TABLET    Take 1 tablet (300 mg total) by mouth daily.   ASPIRIN 325 MG EC TABLET    Take 325 mg by mouth daily.     B COMPLEX VITAMINS TABLET    Take 1 tablet by mouth daily.   CHOLECALCIFEROL (VITAMIN D) 1000 UNITS TABLET    Take 1,000 Units by  mouth daily.   INSULIN GLARGINE (LANTUS SOLOSTAR) 100 UNIT/ML SOPN    Inject 10 Units into the skin at bedtime.   INSULIN PEN NEEDLE 31G X 8 MM MISC    Use with Lantus Solostar Pen once daily as directed   METFORMIN (GLUCOPHAGE) 500 MG TABLET    Take 2 tablets by mouth two times daily   MULTIPLE VITAMIN (MULTIVITAMIN) TABLET    Take 1 tablet by mouth daily.     PANTOPRAZOLE (PROTONIX) 40 MG TABLET    Take 40 mg by mouth daily.   SAXAGLIPTIN HCL (ONGLYZA) 5 MG TABS TABLET    Take 1 tablet (5 mg total) by mouth daily.   SIMVASTATIN (ZOCOR) 40 MG TABLET    Take 1 tablet (40 mg total) by mouth at bedtime.   TRAMADOL (ULTRAM) 50 MG TABLET    Take 50 mg by mouth every 6 (six) hours as needed for pain.   Modified Medications   Modified Medication Previous Medication   ALPRAZOLAM (XANAX) 0.5 MG TABLET ALPRAZolam (XANAX) 0.5 MG tablet      TAKE ONE-HALF TO ONE TABLET BY MOUTH THREE TIMES DAILY AS NEEDED    TAKE ONE-HALF TO ONE TABLET BY MOUTH THREE TIMES DAILY AS NEEDED FOR NERVES   ALPRAZOLAM (XANAX) 0.5 MG TABLET ALPRAZolam (XANAX) 0.5 MG tablet      TAKE ONE-HALF TO ONE TABLET BY MOUTH THREE TIMES DAILY AS NEEDED FOR NERVES    TAKE ONE-HALF TO ONE TABLET BY  MOUTH THREE TIMES DAILY AS NEEDED FOR NERVES   CLOPIDOGREL (PLAVIX) 75 MG TABLET clopidogrel (PLAVIX) 75 MG tablet      TAKE 1 TABLET (75 MG TOTAL) BY MOUTH DAILY.    Take 1 tablet (75 mg total) by mouth daily.   GLUCOSE BLOOD (BAYER CONTOUR TEST) TEST STRIP glucose blood (BAYER CONTOUR TEST) test strip      Use as instructed    Use as instructed   GLYBURIDE (DIABETA) 5 MG TABLET glyBURIDE (DIABETA) 5 MG tablet      TAKE 1 TABLET (5 MG TOTAL) BY MOUTH DAILY WITH BREAKFAST.    TAKE 1 TABLET (5 MG TOTAL) BY MOUTH DAILY WITH BREAKFAST.   PANTOPRAZOLE (PROTONIX) 40 MG TABLET pantoprazole (PROTONIX) 40 MG tablet      TAKE ONE TABLET BY MOUTH DAILY AT 12 NOON    Take 1 tablet (40 mg total) by mouth daily at 12 noon.   PANTOPRAZOLE (PROTONIX) 40 MG  TABLET pantoprazole (PROTONIX) 40 MG tablet      TAKE 1 TABLET (40 MG TOTAL) BY MOUTH DAILY AT 12 NOON.    Take 1 tablet (40 mg total) by mouth daily at 12 noon.  Discontinued Medications   INSULIN GLARGINE (LANTUS SOLOSTAR) 100 UNIT/ML SOPN    Inject 10 Units into the skin at bedtime.

## 2013-03-01 ENCOUNTER — Ambulatory Visit: Payer: Medicare Other

## 2013-03-08 ENCOUNTER — Encounter: Payer: Medicare Other | Attending: Adult Health

## 2013-03-13 ENCOUNTER — Other Ambulatory Visit: Payer: Self-pay | Admitting: Pulmonary Disease

## 2013-03-15 ENCOUNTER — Ambulatory Visit: Payer: Medicare Other

## 2013-03-22 ENCOUNTER — Ambulatory Visit: Payer: Medicare Other

## 2013-03-26 ENCOUNTER — Other Ambulatory Visit: Payer: Self-pay | Admitting: Pulmonary Disease

## 2013-03-28 ENCOUNTER — Telehealth: Payer: Self-pay | Admitting: Pulmonary Disease

## 2013-03-28 MED ORDER — ALPRAZOLAM 0.5 MG PO TABS: ORAL_TABLET | ORAL | Status: DC

## 2013-03-28 NOTE — Telephone Encounter (Signed)
I have called RX into  [facility]. Pt is aware and nothing further needed.

## 2013-03-31 ENCOUNTER — Ambulatory Visit: Payer: Medicare Other

## 2013-04-03 ENCOUNTER — Telehealth: Payer: Self-pay | Admitting: Pulmonary Disease

## 2013-04-03 MED ORDER — GLUCOSE BLOOD VI STRP: ORAL_STRIP | Status: DC

## 2013-04-03 NOTE — Telephone Encounter (Signed)
I spoke with pt. He uses [facility]. I advised will send RX. Nothing further needed

## 2013-04-03 NOTE — Telephone Encounter (Signed)
Last ov w/ SN 11.13.14 LMOM TCB x1 for patient As his diabetic testing strips are going to a different pharmacy, need to verify what brand he uses

## 2013-04-04 ENCOUNTER — Telehealth: Payer: Self-pay | Admitting: Pulmonary Disease

## 2013-04-04 ENCOUNTER — Other Ambulatory Visit: Payer: Self-pay | Admitting: Pulmonary Disease

## 2013-04-04 MED ORDER — GLUCOSE BLOOD VI STRP: ORAL_STRIP | Status: DC

## 2013-04-04 NOTE — Telephone Encounter (Signed)
Rx has been sent in. Pharmacy was incorrect. Nothing further was needed.

## 2013-04-10 ENCOUNTER — Other Ambulatory Visit: Payer: Self-pay | Admitting: Adult Health

## 2013-04-10 ENCOUNTER — Other Ambulatory Visit: Payer: Self-pay | Admitting: Pulmonary Disease

## 2013-04-14 ENCOUNTER — Ambulatory Visit: Payer: Medicare Other

## 2013-04-21 ENCOUNTER — Ambulatory Visit: Payer: Medicare Other

## 2013-04-26 ENCOUNTER — Ambulatory Visit: Payer: Medicare Other

## 2013-05-03 ENCOUNTER — Ambulatory Visit: Payer: Medicare Other

## 2013-05-21 ENCOUNTER — Other Ambulatory Visit: Payer: Self-pay | Admitting: Pulmonary Disease

## 2013-05-24 ENCOUNTER — Encounter: Payer: Medicare Other | Attending: Adult Health | Admitting: *Deleted

## 2013-05-24 ENCOUNTER — Encounter: Payer: Self-pay | Admitting: Pulmonary Disease

## 2013-05-24 ENCOUNTER — Encounter: Payer: Self-pay | Admitting: *Deleted

## 2013-05-24 ENCOUNTER — Other Ambulatory Visit (INDEPENDENT_AMBULATORY_CARE_PROVIDER_SITE_OTHER): Payer: Medicare Other

## 2013-05-24 ENCOUNTER — Ambulatory Visit (INDEPENDENT_AMBULATORY_CARE_PROVIDER_SITE_OTHER): Payer: Medicare Other | Admitting: Pulmonary Disease

## 2013-05-24 VITALS — BP 124/76 | HR 82 | Temp 97.0°F | Ht 66.0 in | Wt 193.0 lb

## 2013-05-24 VITALS — Ht 66.0 in | Wt 191.0 lb

## 2013-05-24 DIAGNOSIS — M545 Low back pain, unspecified: Secondary | ICD-10-CM

## 2013-05-24 DIAGNOSIS — E119 Type 2 diabetes mellitus without complications: Secondary | ICD-10-CM

## 2013-05-24 DIAGNOSIS — Z713 Dietary counseling and surveillance: Secondary | ICD-10-CM | POA: Insufficient documentation

## 2013-05-24 DIAGNOSIS — I6529 Occlusion and stenosis of unspecified carotid artery: Secondary | ICD-10-CM

## 2013-05-24 DIAGNOSIS — N289 Disorder of kidney and ureter, unspecified: Secondary | ICD-10-CM

## 2013-05-24 DIAGNOSIS — M1990 Unspecified osteoarthritis, unspecified site: Secondary | ICD-10-CM

## 2013-05-24 DIAGNOSIS — R413 Other amnesia: Secondary | ICD-10-CM

## 2013-05-24 DIAGNOSIS — C649 Malignant neoplasm of unspecified kidney, except renal pelvis: Secondary | ICD-10-CM

## 2013-05-24 DIAGNOSIS — I10 Essential (primary) hypertension: Secondary | ICD-10-CM

## 2013-05-24 DIAGNOSIS — E7800 Pure hypercholesterolemia, unspecified: Secondary | ICD-10-CM

## 2013-05-24 DIAGNOSIS — I679 Cerebrovascular disease, unspecified: Secondary | ICD-10-CM

## 2013-05-24 LAB — BASIC METABOLIC PANEL
BUN: 29 mg/dL — ABNORMAL HIGH (ref 6–23)
CO2: 29 mEq/L (ref 19–32)
CREATININE: 1.3 mg/dL (ref 0.4–1.5)
Calcium: 9.4 mg/dL (ref 8.4–10.5)
Chloride: 107 mEq/L (ref 96–112)
GFR: 59 mL/min — AB (ref >60.00)
Glucose, Bld: 89 mg/dL (ref 70–99)
Potassium: 5 mEq/L (ref 3.5–5.1)
SODIUM: 143 meq/L (ref 135–145)

## 2013-05-24 LAB — HEMOGLOBIN A1C: HEMOGLOBIN A1C: 7.3 % — AB (ref 4.6–6.5)

## 2013-05-24 MED ORDER — SIMVASTATIN 40 MG PO TABS: ORAL_TABLET | ORAL | Status: DC

## 2013-05-24 NOTE — Patient Instructions (Signed)
Today we updated your med list in our EPIC system...    Continue your current medications the same...  Today we rechecked your DM labs...    We will contact you w/ the results when available...   Keep up the good work w/ diet, exercise & wt reduction...  Call for any questions...  Let's plan a follow up visit in 44mo, sooner if needed for problems.Marland KitchenMarland Kitchen

## 2013-05-24 NOTE — Patient Instructions (Signed)
Plan:  Eat three meals per day and evening snack if desired Aim for 3 Carb Choices per meal (45 grams) +/- 1 either way  Aim for 0-15 Carbs per snack if hungry  Consider reading food labels for Total Carbohydrate and Fat Grams of foods Consider checking BG at alternate times per day to include FBS and 2hpp as directed by MD  Continue to log glucose readings Continue taking medication  as directed by MD

## 2013-05-24 NOTE — Progress Notes (Signed)
Subjective:    Patient ID: Peter Nicholson, male    DOB: May 02, 1940, 73 y.o.   MRN: [id number]  HPI 73 y/o WM here for a follow up visit... he has multiple medical problems as noted below...   ~  June 29, 2011:  6wk ROV & he has some persist leg discomfort & due to f/u w/ Peter Nicholson soon; he's noted some mild swelling & we reviewed no salt, elevation, TEDs;  We also reviewed his medical problems, medications, XRays, & LABS today> see prob list below>>  LABS 3/13:  FLP- at goals on Cres10;  Chems- all wnl & A1c=6.6 on 12mds; CBC- Hg=11.5;  TSH=3.51;  VitD= 39;  PSA=2.75  ~  September 29, 2011:  3658moOV & Peter Nicholson called usKorean the interval since his last visit w/ some hypoglycemia- BS down to 40's at night & he decreased his Metformin & Glyburide from 2Bid to 1Bid; he was also drinking Boost up to 3 cans daily; since the adjustments his sugars have normalized w/ FBS in the 70-90 range & no hypoglycemia but still doing the Boost supplements Qhs; his weight is down 3# to 191# & he is asked to stop the Boost for now & decr the Glyburide to 41m80mam only- labs pending today for further adjustments...    We reviewed prob list, meds, xrays and labs> see below >> LABS 6/18:  Chems- BS=190 A1c=7.4 and we decided to decr the Glyburide to 41mg79m only, but keep the Metform500-2Bid & same Actos & Onglyza...  ~  January 18, 2012:  58mo 58mo& unfortunately Peter Nicholson 16# up to 206# & we reviewed low carb, low fat diet & exercise program needed to lose the weight;  BS is up to 171 & A1c up to 7.7;  We reiterated his important regular med regimen w/ Metform500-2Bid, Glyburide5Qam, Actos30, Onglyza5...  His CC today is incr reflux- w/ dysphagia & food sticking in mid-esoph that occurs every 2-3days & has to vomit to feel better "I quit eating at the cafe across the street"; he had stopped his PPI- Protonix on his own (?why?), PMHx indicates hx 4cmHH, erosive esoph in 2009 & stricture dilated in 2003; rec to restart the Protonix40  & we will refer to GI for EGD & dilatation...    We reviewed prob list, meds, xrays and labs> see below for updates >> OK Flu shot today. LABS 10/13:  Chems- ok x BS=171 A1c=7.7;  CBC- Hg=12.6...   Marland Kitchen  February 23, 2013:  59mo 53mo Peter Nicholson's CC is LBP & [facility] last ?[id number] call for f/u eval; he has Tramadol for pain... We reviewed the following medical problems during today's office visit >>     OSA> he's been off CPAP x yrs & denies sleep disordered breathing, denies snoring, feels he rests well, & wakes refreshed w/o daytime hypersomnolence...    Borderline HBP> not currently on meds, just diet controlled; BP= 140/86 & reminded to avoid sodium & get weight down; he is essentially asymptomatic w/o CP, palpit, SOB, edema, etc...    Cerebrovasc dis> on ASA81 & Plavix75; known severe extracranial cerebrovasc dis w/ left CAE 2000 by Peter Nicholson & known severe bilat cavernous ICA stenoses; he is also s/p vertebral art angioplasty x2 by Peter Nicholson w/ known basilar art occlusion;     Chol> on Simva40 + diet; FLP 10/14 showed TChol 136, TG 54, HDL 49, LDL 77... Continue same.  DM> on Lantus10u, Metform500-2Bid, Diabeta5, Onglyza5, off Actos; Labs 10/14 showed BS=252, A1c=9.1 & Lantus added; f/u labs 11/14 showed BS=182, A1c=8.3; continue same, get wt down.    Overweight> weight = 198# and BMI~33; we reviewed diet, exercise, wt reduction strategies...    GI- GERD, divertics> on Protonix40 daily; he had EGD 11/13 showing HH, chr GERD, stricture dil w/ 37F Maloney dilator, erosive gastritis was HPylori neg & treated w/ daily PPI rx.    GU- Hx horseshoe kidney, renal cell cancer, nephrolithiasis> right side of horseshoe kid removed 1994 (RCCa); followed by Urology for yrs & no recurrence; Creat in the 1.2-1.5 range; PSA in the 1-3 range...    DJD, Gout, LBP> on Allopurinol300, Tramadol50, Nortrip ?66m; followed by Peter Nicholson & Peter Nicholson on ESI injections  & Nortrip?dose, last ?[id number]& he will call for f/u eval...    Neuro- c/o memory impairment> on ASA81 & Plavix75, plus MVI etc; he has severe intracranial atherosclerotic dis and chr cerebellar infarcts on prev scans; more recent memory problems felt to be mild cognitive impairment by Peter Nicholson on eval 3/14 (but he is clearly high risk for vascular dementia problems)...     Anxiety> on Xanax0.5 prn...  We reviewed prob list, meds, xrays and labs> see below for updates >> he had the 2014 flu vaccine in Oct... Pt & wife reminded that he needs more reg medical follow up visits. LABS 10/14 in Epic> REVIEWED..Marland KitchenMarland Kitchen LABS 11/14:  Chems- OK x BS=182, A1c=8.3, BUN=29, Cr=1.2..Marland Kitchen   ~  May 24, 2013:  378moOV & DM recheck; Peter Nicholson lost 5-6# down to 193# on diet + exercise; he reports home BS checks 120-200 range, feeling well & "splitting wood daily"... He remains on Lantus10, Metform500-2Bid, Diabeta5, Onglyza5;  Labs today showed BS=89, A1c=7.3...Marland KitchenMarland KitchenWe reviewed prob list, meds, xrays and labs> see below for updates >>           Problem List:     OBSTRUCTIVE SLEEP APNEA (ICD-327.23) - s/p split night sleep study 11/08- showing an AHI of 19, assoc w/ an O2 sat down to 78%... during the CPAP trial he titrated up to a pressure of 11cm but never ret to REM sleep so optimal pressure is still ???...he uses [facility] for his CPAP needs... ~  12/10: he tells me that he has stopped the CPAP, that he sleeps better without it, not snoring/ rests well, & wakes feeling refreshed... he refuses Sleep Med re-eval etc... ~  11/14: he's been off CPAP x yrs & denies sleep disordered breathing, denies snoring, feels he rests well, & wakes refreshed w/o daytime hypersomnolence.   Hx of HYPERTENSION - not currently on medications...  ~  6/12: BP= 136/80 off meds & denies HA, visual changes, CP, palipit, dizziness, syncope, dyspnea, edema, etc... he states that home BP checks are "OK". ~  1/13: he was sent home  from the NH/ Rehab on Lisinopril & Lasix; both stopped 1/13 by TP w/ renal insuffic & elev K+; subseq improved & BP stable... ~  2/13: BP= 112/68 & feeling weak etc...  3/13:  BP= 140/72 & feeling some better... ~  6/13:  BP= 138/78 & feeling much better; denies CP, palpit, ch in SOB or edema... ~  10/13:  BP= 140/82 & he is c/o reflux & dysphagia today, no CP/ palpit/ SOB/ edema... ~  11/14: not currently on meds, just diet controlled; BP= 140/86 & reminded to avoid sodium & get weight down; he is essentially  asymptomatic w/o CP, palpit, SOB, edema, etc. ~  2/15: still not on meds, just diet controlled; BP= 124/76 & he remains asymptomatic...   CEREBROVASCULAR DISEASE (ICD-437.9) - stable on ASA 339m/d & PLAVIX 78md per DrDeveshwar/ Peter Nicholson/ DrReynolds... he has severe extracranial cerebrovasc disease- esp in the post circulation, and he is s/p vertebral art angioplasty x 2, and w/ a known basilar art occlusion- prev on Coumadin controlled by Neurology (switched to ASA/ Plavix in 2010)... also followed by DrSheryn Bisonnd DrDeveshwar for IR... [facility] ~  CDoppler 2/07 showed 0-39% bilat ICA stenoses, s/p left CAE w/ DPA... ~  eval 3/10 by DrSheryn Bison/ CDopplers that showed similar patency w/ 0-39% bilat ICA stenoses; due to the 10 yr interval Peter Nicholson did not feel he needed any additional follow up. ~  12/10: continued f/u DrDevershwar IR w/ MRI/ MRAs showing bilat carotid siphon region atherosclerosis & narrowing (unchanged), and markedly diseased distal left vertebral & prox basilar art narrowing (no change)... ~  11/12: CTA head & neck showed similar severe dis w/ mod to severe bilat cavernous ICA stenoses due to extensive atherosclerosis; severe distal vertebral atherosclerosis- distal right occluded, distal left w/ high grade stenosis; mod to severe bilat PCA stenoses; chr cerebellar infarcts, no acute infarcts. ~  11/14: on ASA81 & Plavix75; known severe  extracranial cerebrovasc dis w/ left CAE 2000 by Peter Nicholson & known severe bilat cavernous ICA stenoses; he is also s/p vertebral art angioplasty x2 by Peter Nicholson w/ known basilar art occlusion.  VENOUS INSUFFICIENCY (ICD-459.81) - he knows to avoid sodium, elevate legs, wear support hose...  HYPERCHOLESTEROLEMIA (ICD-272.0) - on CRESTOR 1029m since Hosp 12/12... ~  [place]08 showed TChol 153, TG 120, HDL 36, LDL 93 ~  FLP 8/09 showed TChol 131, TG 84, HDL 38, LDL 76 ~  2010:  pt was not fasting for blood work... rec to continue Crestor10 + diet efforts. ~  FLP 10/11 showed TChol 210, TG 161, HDL 40, LDL 149... rec to start Crestor 37m68m he will decide. ~  FLP 6/12 on Cres20 showed TChol 106, TG 95, HDL 41, LDL 46... Continue same. ~  [place] [facility] [place] showed TChol 100, TG 101, HDL 37, LDL 43... They decr to Cres10. ~  FLP 3/13 on Cres10 showed TChol 118, TG 67, HDL 44, LDL 61... Continue same, later switched to Simva40 for $$ reasons. ~  FLP 10/14 on Simva40 showed TChol 136, TG 54, HDL 49, LDL 77... Continue same.  DIABETES MELLITUS (ICD-250.00) - on METFORMIN 500mg39mid, GLYBURIDE 5mgBi23mACTOS 30mg/d37mONGLYZA 5mg/d..80m  labs 6/08 showed BS= 155, HgA1c= 8.1... (glybMarland Kitchenride incr to 5Bid). ~  labs 11/08 showed BS= 352, HgA1c= 8.1.... (Actos added). ~  labs 8/09 showed BS= 128, HgA1c= 7.1... rec- Marland KitchenMarland Kitchenme meds, better diet, get wt down! ~  labs 2/10 showed BS= 249, A1c= 7.3.... Metform incr to 2Bid. ~  labs 12/10 (nonfasting- on all 3 meds) showed BS= 87, A1c= 6.9 ~  labs 10/11 on Metform1000Bid+Gybur5Bid showed BS= 180, A1c= 8.4... rec tMarland KitchenMarland Kitchenincr GLYBUR10Bid + diet. ~  labs 6/12 on Metform1000Bid+Glybur10Bid showed BS= 196, A1c= 11.2 ?what happened?> he declined insulin & Endocrine consult, opted for max oral meds by adding ACTOS 30mg/d &43mLYZA 5mg/d... [place] and A1c= 7.2; covered w/ SSI in hosp & placed back on 4 oral meds on disch from  rehab... ~  Labs 1/13 on  all 4 meds showed BS= 230, A1c=7.1; we reviewed diet & he will start PT soon... ~  Labs 3/13 on 35mds showed BS= 72, A1c= 6.6 ~  4/13: pt called w/ hypoglycemia & we decreased Glyburide 527mfrom 2Bid to 1Bid... ~  6/13:  Pt on Metform500-2Bid, Glybur5m79md, Actos30, Onglyza5; BS at home 70-90 he says & still drinking Boost; Labs showed BS=190  A1c=7.4  ; we decided to cut the Glyburide5mg52mm only but keep the Metform500-2Bid +Actos +Onglyza... ~  10/13: on his 4med53mBS=171, A1c=7.7 but he's gained 16#; told to STOP the boost, get on diet, get wt down, take all meds every day... ~  10/14: seen by TP> Labs 10/14 showed BS=252, A1c=9.1 & Lantus added to his Metform500-2Bid, Diabeta5, Onglyza5; Lantus10u added... ~  11/14: on Lantus10u, Metform500-2Bid, Diabeta5, Onglyza5, off Actos; f/u labs 11/14 showed BS=182, A1c=8.3; continue same, get wt down.  OVERWEIGHT (ICD-278.02) - he states that he is not on much of a diet & we reviewed low carb/ no sweets/ low fat diet. ~  weight 8/09 = 216# ~  weight 2/10 = 216# ~  weight 12/10 = 212# ~  weight 10/11 = 210# ~  Weight 6/12 = 206# and he admits to not being on much of a diet... ~  Weight 2/13 = non weight bearing still... ~  Weight 3/13 = 193# ~  Weight 6/13 = 191# ~  Weight 10/13 = 206# ~  Weight 11/14 = 198# ~  Weight 2/15 = 193#  GERD (ICD-530.81) - prev on Nexium40, he switched to OTC Prilosec, now PROT[place]/70me to Plavix Rx; pt had stopped this on his own & asked to restart 10/13. ~  EGD 19/03 showed 4cmHH,[place] & esoph stricture dilated... ~  EGD 12/09 by DrPatterson showed HH & severe erosive esophagitis from GERD... ~  10/13: presents w/ incr reflux- w/ dysphagia & food sticking in mid-esoph that occurs every 2-3days & has to vomit to feel better; he had stopped his PPI on his own (?why?), rec to restart the Protonix40 & we will refer to GI for EGD & dilatation... ~  10/13: he saw DrPatterson> EGD 11/13  showed HH, chr GERD, stricture dil w/ 58F Maloney dilator, erosive gastritis was HPylori neg & treated w/ daily PPI rx...   DIVERTICULOSIS OF COLON (ICD-562.10) - note: [place]; prev on MiralaBoykinecently noted loose stools on the Metformin 1000mgBi76m ~  colonoscopy 10/03 by DrPatterson showed divertics only... ~  colonoscopy 12/09 by DrPatterson was also normal x for divertics...  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 Hx of RENAL CELL CANCER (ICD-189.0) - he had a partial nephrectomy of the right side of his horseshoe kidney in 1994 due to renal cell ca... still followed by DrPeterGlascock by his hx but we don't have any recent notes... ~  labs 10/11 showed PSA= 0.95 ~  Labs 3/13 showed BUN=21, Creat=1.1, PSA=2.75 ~  Labs 10/13 showed BUN= 25, Creat= 1.2 ~  Labs 10/14 showed BUN=38, Cr=1.5;  Labs 11/14 showed BUN=29, Cr=1.2  Hx of NEPHROLITHIASIS (ICD-592.0) - remote hx of kidney stones followed by DrPeterson...  DEGENERATIVE JOINT DISEASE (ICD-715.90) &  GOUT (ICD-274.9) - on ALLOPURINOL 300mg/d.33m  labs 10/11 showed URIC= 4.7 ~  He was Hosp 11/[facility] 03/27/11 after fall from ladder cleaning gutters w/ complex right tibial plat fx- ORIF, 2 operations by  DrHardy, & hosp course complic by ileus, TIA, electrolyte abn, etc;  Event disch to Rehab & he was attended by [facility]...  BACK PAIN, LUMBAR (ICD-724.2) - known degenerative spondylitic disease at L4-5 and L5-S1 prev followed by [facility] (now Peter Nicholson & Peter Nicholson)... ~  6/12: he reports further back pain w/ eval by Chiropractor showing L4 disc prob & regular adjustments; but now notes incr pain & he requests referral to Neurosurg for further eval ==> seen by Peter Nicholson, then Peter Nicholson for shots which has helped... ~  2014: he has continued f/u w/ Peter Nicholson[facility] & received periodic ESI injections for his Lumbar  DDD; they added NORTRIPTYLINE 17mQhs & slowly incr the dose  NEURO >> Hx Left Hemispheric TIA in 11/12 & MEMORY IMPAIRMENT ~  3/14:  He had Neuro eval Peter Nicholson> he did Labs, EEG, MRI Brain, & MRA Brain & Neck; Rec to take 2 FishOil caps daily, CerefolinNAC daily, work puzzles etc, continue Plavix & strict control of BP, Chol, DM; they did not start Aricept...  ANXIETY (ICD-300.00) - uses ALPRAZOLAM 0.563mas needed.   Past Surgical History  Procedure Laterality Date  . Incisional hernia repair  1/96    Dr.Leone  . Nasal sinus surgery  12/96    Dr.Redman  . Cholecystectomy  9/04    Dr. LeRebekah [place]. 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 Carotid endarterectomy  09/2000    Dr.Lawson  . Veterbral art angioplasty      x2  . Small intestine surgery    . External fixation leg  03/10/2011    Procedure: EXTERNAL FIXATION LEG;  Surgeon: MiRozanna Box [facility]ics;  Laterality: Right;  . Orif tibia plateau  03/24/2011    Procedure: OPEN REDUCTION INTERNAL FIXATION (ORIF) TIBIAL PLATEAU;  Surgeon: MiRozanna Box [facility]: [facility]ics;  Laterality: Right;    Outpatient Encounter Prescriptions as of 05/24/2013  Medication Sig  . allopurinol (ZYLOPRIM) 300 MG tablet Take 1 tablet (300 mg total) by mouth daily.  . Marland KitchenLPRAZolam (XANAX) 0.5 MG tablet TAKE ONE-HALF TO ONE TABLET BY MOUTH THREE TIMES DAILY AS NEEDED FOR NERVES  . aspirin 325 MG EC tablet Take 325 mg by mouth daily.    . Marland Kitchen complex vitamins tablet Take 1 tablet by mouth daily.  . cholecalciferol (VITAMIN D) 1000 UNITS tablet Take 1,000 Units by mouth daily.  . clopidogrel (PLAVIX) 75 MG tablet TAKE 1 TABLET (75 MG TOTAL) BY MOUTH DAILY.  . Marland Kitchenlucose blood (BAYER CONTOUR TEST) test strip Use as instructed  . glyBURIDE (DIABETA) 5 MG tablet TAKE 1 TABLET (5 MG TOTAL) BY MOUTH DAILY WITH BREAKFAST.  . Marland Kitchennsulin Glargine (LANTUS SOLOSTAR) 100 UNIT/ML SOPN Inject 10 Units  into the skin at bedtime.  . Insulin Pen Needle 31G X 8 MM MISC Use with Lantus Solostar Pen once daily as directed  . metFORMIN (GLUCOPHAGE) 500 MG tablet Take 2 tablets by mouth two times daily  . Multiple Vitamin (MULTIVITAMIN) tablet Take 1 tablet by mouth daily.    . pantoprazole (PROTONIX) 40 MG tablet TAKE 1 TABLET (40 MG TOTAL) BY MOUTH DAILY AT 12 NOON.  . saxagliptin HCl (ONGLYZA) 5 MG TABS tablet Take 1 tablet (5 mg total) by mouth daily.  . simvastatin (ZOCOR) 40 MG tablet TAKE ONE TABLET BY MOUTH AT BEDTIME  . traMADol (ULTRAM) 50 MG tablet Take 50 mg by mouth every 6 (six)  hours as needed for pain.   . [DISCONTINUED] ALPRAZolam (XANAX) 0.5 MG tablet TAKE ONE-HALF TO ONE TABLET BY MOUTH THREE TIMES DAILY AS NEEDED  . [DISCONTINUED] pantoprazole (PROTONIX) 40 MG tablet Take 40 mg by mouth daily.  . [DISCONTINUED] pantoprazole (PROTONIX) 40 MG tablet TAKE ONE TABLET BY MOUTH DAILY AT 12 NOON    Allergies  Allergen Reactions  . Dilaudid [Hydromorphone Hcl]   . Promethazine Hcl     REACTION: hallucinations    Current Medications, Allergies, Past Medical History, Past Surgical History, Family History, and Social History were reviewed in [employer] record.    Review of Systems        See HPI - all other systems neg except as noted...  The patient complains of dyspnea on exertion.  The patient denies anorexia, fever, weight loss, weight gain, vision loss, decreased hearing, hoarseness, chest pain, syncope, peripheral edema, prolonged cough, headaches, hemoptysis, abdominal pain, melena, hematochezia, severe indigestion/heartburn, hematuria, incontinence, muscle weakness, suspicious skin lesions, transient blindness, difficulty walking, depression, unusual weight change, abnormal bleeding, enlarged lymph nodes, and angioedema.     Objective:   Physical Exam     WD, WN, 73 y/o WM in NAD...  GENERAL:  Alert & oriented; pleasant & cooperative... HEENT:   /AT, EOM-wnl, PERRLA, EACs-clear, TMs-wnl, NOSE-clear, THROAT-clear & wnl. NECK:  Supple w/ fairROM; no JVD; s/p left CAE, +bruits; no thyromegaly or nodules palpated; no lymphadenopathy. CHEST:  Clear to P & A; without wheezes/ rales/ or rhonchi heard... HEART:  Regular Rhythm; gr 1/6 SEM without rubs or gallops detected... ABDOMEN:  Soft & nontender; normal bowel sounds; no organomegaly or masses palpated... EXT: right knee deformity s/p ORIF, mod arthritic changes; no varicose veins/ +venous insuffic/ 1+ edema. NEURO:  CN's intact; no focal neuro deficits... DERM:  No lesions noted; no rash etc...  RADIOLOGY DATA:  Reviewed in the EPIC EMR & discussed w/ the patient...    >>CXR 11/12 showed borderline heart size, tortuous aorta, clear & NAD...    >>EKG 12/12 showed NSR, rate92, NSSTTWA...  LABORATORY DATA:  Reviewed in the EPIC EMR & discussed w/ the patient...   Assessment & Plan:    DM>  On Lantus10, Metform, Diabeta, Onglyza & control is improved... Discussed diet & wt reduction...   CEREBROVASC Disease & TIA 12/12>  Known severe cerebrovasc dis & he had TIA while in hosp for fx leg (he was off plavix at the time); treated w/ Lovenox perioperatively but he hasn't yet restarted the Plavix; He is w/o cerebral ischemic symptoms on his Rx, s/p left CAE in 2000 & 2 vertebral art angioplasties in 2001 & 2002... NEURO> Mild Cognitive Impairment & he is at risk for vascular dementia; rec to maintain careful medical followup of BP, Chol, DM, & get the weight down!  CHOL>  on Simva40 now & FLP looks good...  Overweight>  We discussed low carb, low fat wt reducing diet...  GI>  GERD prev controlled w/ Protonix but he stopped on his own (?why?); EGD 11/13 w/ dilatation & reminded to take the Protonix40 every day...  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  DJD/ Hx Gout/ LBP>  He was seeing chiropractor regularly regarding LBP & then  f/u w/ Peter Nicholson,Neurosurg & had ESI by Peter Nicholson & the shots have helped...  s/p fall w/ right Tibial plateaux fx> 1st ext fixation, 2nd ORIF>  By DrHardy, Ortho; exercise is difficult but very  much needed; he's had extensive PT training...  Anxiety>  He remains on Alpraz Prn...   Patient's Medications  New Prescriptions   BLOOD GLUCOSE MONITORING SUPPL (ONE TOUCH ULTRA SYSTEM KIT) W/DEVICE KIT    Test blood sugar up to 3 times daily   GLUCOSE BLOOD TEST STRIP    Test blood sugar up to 3 times daily   SERTRALINE (ZOLOFT) 50 MG TABLET    Take 1 tablet (50 mg total) by mouth at bedtime.  Previous Medications   ASPIRIN 325 MG EC TABLET    Take 325 mg by mouth daily.     B COMPLEX VITAMINS TABLET    Take 1 tablet by mouth daily.   CHOLECALCIFEROL (VITAMIN D) 1000 UNITS TABLET    Take 1,000 Units by mouth daily.   CLOPIDOGREL (PLAVIX) 75 MG TABLET    TAKE 1 TABLET (75 MG TOTAL) BY MOUTH DAILY.   INSULIN PEN NEEDLE 31G X 8 MM MISC    Use with Lantus Solostar Pen once daily as directed   METFORMIN (GLUCOPHAGE) 500 MG TABLET    Take 2 tablets by mouth two times daily   MULTIPLE VITAMIN (MULTIVITAMIN) TABLET    Take 1 tablet by mouth daily.     PANTOPRAZOLE (PROTONIX) 40 MG TABLET    TAKE 1 TABLET (40 MG TOTAL) BY MOUTH DAILY AT 12 NOON.   SAXAGLIPTIN HCL (ONGLYZA) 5 MG TABS TABLET    Take 1 tablet (5 mg total) by mouth daily.   TRAMADOL (ULTRAM) 50 MG TABLET    Take 50 mg by mouth every 6 (six) hours as needed for pain.   Modified Medications   Modified Medication Previous Medication   ALLOPURINOL (ZYLOPRIM) 300 MG TABLET allopurinol (ZYLOPRIM) 300 MG tablet      TAKE 1 TABLET (300 MG TOTAL) BY MOUTH DAILY.    Take 1 tablet (300 mg total) by mouth daily.   ALPRAZOLAM (XANAX) 0.5 MG TABLET ALPRAZolam (XANAX) 0.5 MG tablet      TAKE ONE-HALF TO ONE TABLET BY MOUTH THREE TIMES DAILY AS NEEDED FOR NERVES    TAKE ONE-HALF TO ONE TABLET BY MOUTH THREE TIMES DAILY AS NEEDED FOR NERVES   GLYBURIDE  (DIABETA) 5 MG TABLET glyBURIDE (DIABETA) 5 MG tablet      TAKE 1 TABLET (5 MG TOTAL) BY MOUTH DAILY WITH BREAKFAST.    TAKE 1 TABLET (5 MG TOTAL) BY MOUTH DAILY WITH BREAKFAST.   INSULIN GLARGINE (LANTUS SOLOSTAR) 100 UNIT/ML SOLOSTAR PEN Insulin Glargine (LANTUS SOLOSTAR) 100 UNIT/ML SOPN      Inject 10 Units into the skin at bedtime.    Inject 10 Units into the skin at bedtime.   SIMVASTATIN (ZOCOR) 40 MG TABLET simvastatin (ZOCOR) 40 MG tablet      TAKE ONE TABLET BY MOUTH AT BEDTIME    TAKE ONE TABLET BY MOUTH AT BEDTIME  Discontinued Medications   ALPRAZOLAM (XANAX) 0.5 MG TABLET    TAKE ONE-HALF TO ONE TABLET BY MOUTH THREE TIMES DAILY AS NEEDED   ALPRAZOLAM (XANAX) 0.5 MG TABLET    TAKE ONE-HALF TO ONE TABLET BY MOUTH THREE TIMES DAILY AS NEEDED FOR NERVES   GLUCOSE BLOOD (BAYER CONTOUR TEST) TEST STRIP    Use as instructed   PANTOPRAZOLE (PROTONIX) 40 MG TABLET    Take 40 mg by mouth daily.   PANTOPRAZOLE (PROTONIX) 40 MG TABLET    TAKE ONE TABLET BY MOUTH DAILY AT 12 NOON

## 2013-05-24 NOTE — Progress Notes (Signed)
Appt start time: 0930 end time:  1100.   Assessment:  Patient was seen on  05/24/13  for individual diabetes education. Peter Nicholson presents with his wife Peter Nicholson for DSME r/t T2DM. His glucose testing routine is before bed with a 7day avg of 151mg /dl. (81-224mg /dl). This morning he did perform FBS of 117mg /dl.  Current HbA1c: 8.3% November 2014  Preferred Learning Style:   No preference indicated   Learning Readiness:   Change in progress  MEDICATIONS: See List: Lantus, Metformin, glyburide....saxagliptin noted in medication profile yet patient did not bring this medication with him.  DIETARY INTAKE:  Usual eating pattern includes 2 meals and 2 snacks per day.  Everyday foods include Well balanced food selection.  Avoided foods include fried foods.    24-hr recall:  B ( 7:30 AM): Waffle, regular syrup/bacon . Egg/bacon, occasional hash browns 1/2C, grits 9/3O, 1/2 slice toast Snk ( AM):  none L ( 3:00 PM): Does not eat true lunch...... Peanut butter/crackers, cheese/crackers  Snk ( PM): none D ( 10:00 PM): beef, small baked potato, vegetables Snk ( PM): pop corn in oil, 2 coconut cookies and milk (1%) Beverages: Iced Green tea/1/2 stevia 1/2 sugar. Drinks Boost throughout the day to take medications (2 containers)  Usual physical activity: no structured physical activity. Likes to be active such as splitting wood   Intervention:  Nutrition counseling provided.  Discussed diabetes disease process and treatment options.  Discussed physiology of diabetes and role of obesity on insulin resistance.  Encouraged moderate weight reduction to improve glucose levels.  Discussed role of medications and diet in glucose control  Provided education on macronutrients on glucose levels.  Provided education on carb counting, importance of regularly scheduled meals/snacks, and meal planning  Discussed effects of physical activity on glucose levels and long-term glucose control.  Recommended 150  minutes of physical activity/week.  Reviewed patient medications.  Discussed role of medication on blood glucose and possible side effects  Discussed blood glucose monitoring and interpretation.  Discussed recommended target ranges and individual ranges.    Described short-term complications: hyper- and hypo-glycemia.  Discussed causes,symptoms, and treatment options.  Discussed prevention, detection, and treatment of long-term complications.  Discussed the role of prolonged elevated glucose levels on body systems.  Discussed role of stress on blood glucose levels and discussed strategies to manage psychosocial issues.  Discussed recommendations for long-term diabetes self-care.  Established checklist for medical, dental, and emotional self-care.  Plan:  Eat three meals per day and evening snack if desired Aim for 3 Carb Choices per meal (45 grams) +/- 1 either way  Aim for 0-15 Carbs per snack if hungry  Begin reading food labels for Total Carbohydrate and Fat Grams of foods Consider checking BG at alternate times per day to include FBS and 2hpp or as directed by MD  Continue to log glucose readings Continue taking medication  as directed by MD  Teaching Method Utilized:  Visual Auditory Hands on  Handouts given during visit include: Living Well with Diabetes Carb Counting and Food Label handouts Meal Plan Card My plate method  Snack sheet  Barriers to learning/adherence to lifestyle change: none  Diabetes self-care support plan:   [facility] support group  wife  Demonstrated degree of understanding via:  Teach Back   Monitoring/Evaluation:  Dietary intake, exercise, test glucose, and body weight, follow up in 6 week(s).

## 2013-06-07 ENCOUNTER — Telehealth: Payer: Self-pay | Admitting: Pulmonary Disease

## 2013-06-07 NOTE — Telephone Encounter (Signed)
RX for simvastatin was sent 05/24/13 to [facility]. LMTCB x1

## 2013-06-09 ENCOUNTER — Telehealth: Payer: Self-pay | Admitting: Pulmonary Disease

## 2013-06-09 DIAGNOSIS — H9190 Unspecified hearing loss, unspecified ear: Secondary | ICD-10-CM

## 2013-06-09 NOTE — Telephone Encounter (Signed)
I called and spoke with pt. He reports he already has simvastatin now. Nothing further needed per pt

## 2013-06-09 NOTE — Telephone Encounter (Signed)
Called and spoke with pts wife and she stated that the pt needs to be set up for a hearing test.  She stated that the pt will be getting hearing aids from [facility] but they do not do the hearing tests.  SN please advise.   Allergies  Allergen Reactions  . Dilaudid [Hydromorphone Hcl]   . Promethazine Hcl     REACTION: hallucinations   Current Outpatient Prescriptions on File Prior to Visit  Medication Sig Dispense Refill  . allopurinol (ZYLOPRIM) 300 MG tablet Take 1 tablet (300 mg total) by mouth daily.  90 tablet  3  . ALPRAZolam (XANAX) 0.5 MG tablet TAKE ONE-HALF TO ONE TABLET BY MOUTH THREE TIMES DAILY AS NEEDED FOR NERVES  90 tablet  5  . aspirin 325 MG EC tablet Take 325 mg by mouth daily.        Marland Kitchen b complex vitamins tablet Take 1 tablet by mouth daily.      . cholecalciferol (VITAMIN D) 1000 UNITS tablet Take 1,000 Units by mouth daily.      . clopidogrel (PLAVIX) 75 MG tablet TAKE 1 TABLET (75 MG TOTAL) BY MOUTH DAILY.  90 tablet  1  . glucose blood (BAYER CONTOUR TEST) test strip Use as instructed  100 each  12  . glyBURIDE (DIABETA) 5 MG tablet TAKE 1 TABLET (5 MG TOTAL) BY MOUTH DAILY WITH BREAKFAST.  90 tablet  1  . Insulin Glargine (LANTUS SOLOSTAR) 100 UNIT/ML SOPN Inject 10 Units into the skin at bedtime.  3 mL  5  . Insulin Pen Needle 31G X 8 MM MISC Use with Lantus Solostar Pen once daily as directed  100 each  5  . metFORMIN (GLUCOPHAGE) 500 MG tablet Take 2 tablets by mouth two times daily  360 tablet  3  . Multiple Vitamin (MULTIVITAMIN) tablet Take 1 tablet by mouth daily.        . pantoprazole (PROTONIX) 40 MG tablet TAKE 1 TABLET (40 MG TOTAL) BY MOUTH DAILY AT 12 NOON.  90 tablet  3  . saxagliptin HCl (ONGLYZA) 5 MG TABS tablet Take 1 tablet (5 mg total) by mouth daily.  90 tablet  3  . simvastatin (ZOCOR) 40 MG tablet TAKE ONE TABLET BY MOUTH AT BEDTIME  90 tablet  3  . traMADol (ULTRAM) 50 MG tablet Take 50 mg by mouth every 6 (six) hours as needed  for pain.       . [DISCONTINUED] sertraline (ZOLOFT) 50 MG tablet Take 1 tablet (50 mg total) by mouth daily.  90 tablet  3   No current facility-administered medications on file prior to visit.

## 2013-06-13 NOTE — Telephone Encounter (Signed)
LMTCB

## 2013-06-13 NOTE — Telephone Encounter (Signed)
Per SN - Needs Audiology testing - Where does he want it??  - We usually use [facility]

## 2013-06-14 NOTE — Telephone Encounter (Signed)
LMTCBx2. Jennifer Castillo, CMA  

## 2013-06-14 NOTE — Telephone Encounter (Signed)
Order placed and pt spouse is aware. [place], CMA

## 2013-06-27 ENCOUNTER — Other Ambulatory Visit: Payer: Self-pay | Admitting: Adult Health

## 2013-06-29 NOTE — Telephone Encounter (Signed)
SN primary care pt - refills on Metformin and Allopurinol but SN is retiring from primary care as of July 12 2013.  Not to pharmacy to have pt call the office to discuss.

## 2013-07-05 ENCOUNTER — Encounter: Payer: Medicare Other | Attending: Adult Health | Admitting: *Deleted

## 2013-07-05 VITALS — Ht 66.0 in | Wt 191.5 lb

## 2013-07-05 DIAGNOSIS — E119 Type 2 diabetes mellitus without complications: Secondary | ICD-10-CM

## 2013-07-05 DIAGNOSIS — Z713 Dietary counseling and surveillance: Secondary | ICD-10-CM | POA: Insufficient documentation

## 2013-07-05 NOTE — Progress Notes (Signed)
Appt start time: 1100 end time:  1130.  Assessment:  Patient was seen on  07/05/13 for individual diabetes education. He presents with his wife. FBS this AM 110mg /dl, 2hpp after snack in the evening range 204-257mg /dl for the past 4 days. He notes he has been having large snacks. DIETARY INTAKE:Last night he has "a couple cookies", 1/2C milk, Ice cream..the past four days have been similar.  From March 1 - July 05, 2013 his 2hpp readings avg. 166mg /dl  Readiness:   Change in progress  Usual physical activity: Will the weather improving he will be out shopping wood and mowing the grass.     Intervention:  Nutrition counseling provided.  Encouraged moderate weight reduction to improve glucose levels.  Discussed role of medications and diet in glucose control  Provided education on macronutrients on glucose levels.  Provided education on carb counting, importance of regularly scheduled meals/snacks, and meal planning  Discussed effects of physical activity on glucose levels and long-term glucose control.  Recommended 150 minutes of physical activity/week.  Discussed blood glucose monitoring and interpretation.  Discussed recommended target ranges and individual ranges.    Discussed role of stress on blood glucose levels and discussed strategies to manage psychosocial issues.  PLAN: Decrease evening snack to one carbohydrate serving (15g), always have protein with your snack, consider drinking water rather than milk Test glucose Fasting in the morning and 2 hours after dinner With the weather improving, will get outside and active more  Teaching Method Utilized:  Visual Auditory Hands on  Handouts given during visit include:  BMI Chart  ADA blood glucose testing goals  Barriers to learning/adherence to lifestyle change: motivation  Screening: No falls, Advanced directive info given   Monitoring/Evaluation:  Dietary intake, exercise, test glucose, and body weight, return for  f/u prn.

## 2013-07-05 NOTE — Patient Instructions (Addendum)
PLAN: Decrease evening snack to one carbohydrate serving (15g), always have protein with your snack, consider drinking water rather than milk Test glucose Fast in the morning and 2 hours after dinner With the weather improving, will get outside and active more

## 2013-07-24 ENCOUNTER — Telehealth: Payer: Self-pay | Admitting: Pulmonary Disease

## 2013-07-24 MED ORDER — GLUCOSE BLOOD VI STRP: ORAL_STRIP | Status: DC

## 2013-07-24 NOTE — Telephone Encounter (Signed)
Pt aware rx has been sent. Nothing further needed

## 2013-07-28 ENCOUNTER — Telehealth: Payer: Self-pay | Admitting: Pulmonary Disease

## 2013-07-28 MED ORDER — GLUCOSE BLOOD VI STRP: ORAL_STRIP | Status: DC

## 2013-07-28 MED ORDER — ONETOUCH ULTRA SYSTEM W/DEVICE KIT: PACK | Status: DC

## 2013-07-28 NOTE — Telephone Encounter (Signed)
Called spoke with [facility]. They are needing RX resent with specific directions, DX code on RX and only can have 5 refills. I have done so. Nothing further needed

## 2013-07-28 NOTE — Telephone Encounter (Signed)
Pharmacy calling requesting rx for One Touch meter and strips rx so insurance will cover it.  Please advise if ok to send

## 2013-07-28 NOTE — Telephone Encounter (Signed)
rx for the meter and the test strips have been sent to the pharmacy per pharmacy request. Nothing further is needed.

## 2013-07-28 NOTE — Telephone Encounter (Signed)
Spoke with Peter Nicholson  She states that she is needing rx sent for DM test strips  I advised that we sent this in already  She states pharm did not have it  I called pharm and was advised they need new rx with dx code  I have sent this in and spouse aware  Nothing further needed

## 2013-08-15 ENCOUNTER — Telehealth: Payer: Self-pay | Admitting: Pulmonary Disease

## 2013-08-15 NOTE — Telephone Encounter (Signed)
lmtcb x1 

## 2013-08-16 NOTE — Telephone Encounter (Signed)
Spoke with patient wife. States that she feels patient is depressed; he is not eating, is losing weight, not talking/communicating, increased amounts of sleep/fatigue. Wife states that daughter also picked out these same symptoms and mentioned that she needed to contact PCP Pt has a history of taking Zoloft 50mg  1 po qd in 2013  Pt was unaware that Dr Lenna Gilford has retired from [employer].  States that she is not sure yet if she wants to relocate his care or stay with SN and just pay a higher co-pay. Explained to the patient that once she decides to let our office know.  [facility] [address], [place]  Please advise on the above Rx request Dr Lenna Gilford. Thanks.  Allergies  Allergen Reactions  . Dilaudid [Hydromorphone Hcl]   . Promethazine Hcl     REACTION: hallucinations

## 2013-08-17 NOTE — Telephone Encounter (Signed)
Per SN---  We will be happy to continue to provide care and refills for the pt until he can set up with new PCP.   Same for the wife.    Ok to call in zoloft 50 mg  1 qhs.   i have called and lmomtcb for the pts wife.

## 2013-08-18 MED ORDER — SERTRALINE HCL 50 MG PO TABS: 50.0000 mg | ORAL_TABLET | Freq: Every day | ORAL | Status: DC

## 2013-08-18 NOTE — Telephone Encounter (Signed)
Spouse aware and RX called in. Nothing further needed

## 2013-08-30 ENCOUNTER — Telehealth: Payer: Self-pay | Admitting: Pulmonary Disease

## 2013-08-30 MED ORDER — INSULIN GLARGINE 100 UNIT/ML SOLOSTAR PEN: 10.0000 [IU] | PEN_INJECTOR | Freq: Every day | SUBCUTANEOUS | Status: DC

## 2013-08-30 NOTE — Telephone Encounter (Signed)
Lantus Solostar 100 IU/mL-- Inject 10 Units into the skin at bedtime x 5RF refilled [facility] [place]  Appt 11/21/13 with Dr Nadel--pt has not chosen a new PCP yet--Pt states that he wishes to continue seeing Dr Lenna Gilford if at all possible. Pt aware that if he does not transfer that his insurance copay will increase. Pt okay with this. Pt asks if anything further needed to let him know.

## 2013-10-11 ENCOUNTER — Other Ambulatory Visit: Payer: Self-pay | Admitting: Pulmonary Disease

## 2013-10-12 ENCOUNTER — Other Ambulatory Visit: Payer: Self-pay | Admitting: Pulmonary Disease

## 2013-10-18 ENCOUNTER — Other Ambulatory Visit: Payer: Self-pay | Admitting: Pulmonary Disease

## 2013-10-26 ENCOUNTER — Telehealth: Payer: Self-pay | Admitting: Pulmonary Disease

## 2013-10-26 MED ORDER — ALPRAZOLAM 0.5 MG PO TABS: ORAL_TABLET | ORAL | Status: DC

## 2013-10-26 NOTE — Telephone Encounter (Signed)
Please advise Dr Lenna Gilford if okay to refill Alprazolam .5mg  Take 1/2-1 tab tid prn.  #90 Last filled 03/28/13 with 5 RF Upcoming OV 11/21/13 at [place], [place]

## 2013-10-26 NOTE — Telephone Encounter (Signed)
[place] for refill to last until his next OV.  thanks

## 2013-10-26 NOTE — Telephone Encounter (Signed)
rx has been called in. Nothing further needed

## 2013-11-01 ENCOUNTER — Telehealth: Payer: Self-pay | Admitting: Pulmonary Disease

## 2013-11-01 MED ORDER — ALPRAZOLAM 0.5 MG PO TABS: ORAL_TABLET | ORAL | Status: DC

## 2013-11-01 NOTE — Telephone Encounter (Signed)
Called and spoke with pts wife and she stated that the pt is needing the refill of his alprazolam.  [facility] requests to [place] but we did not respond.  No refill requests have been received. i have called this to the pharmacy for the pt.

## 2013-11-21 ENCOUNTER — Other Ambulatory Visit (INDEPENDENT_AMBULATORY_CARE_PROVIDER_SITE_OTHER): Payer: Medicare Other

## 2013-11-21 ENCOUNTER — Ambulatory Visit (INDEPENDENT_AMBULATORY_CARE_PROVIDER_SITE_OTHER): Payer: Medicare Other | Admitting: Pulmonary Disease

## 2013-11-21 ENCOUNTER — Encounter: Payer: Self-pay | Admitting: Pulmonary Disease

## 2013-11-21 VITALS — BP 142/84 | HR 97 | Temp 97.6°F | Ht 66.0 in | Wt 191.6 lb

## 2013-11-21 DIAGNOSIS — C649 Malignant neoplasm of unspecified kidney, except renal pelvis: Secondary | ICD-10-CM

## 2013-11-21 DIAGNOSIS — M1990 Unspecified osteoarthritis, unspecified site: Secondary | ICD-10-CM

## 2013-11-21 DIAGNOSIS — N289 Disorder of kidney and ureter, unspecified: Secondary | ICD-10-CM

## 2013-11-21 DIAGNOSIS — C641 Malignant neoplasm of right kidney, except renal pelvis: Secondary | ICD-10-CM

## 2013-11-21 DIAGNOSIS — I10 Essential (primary) hypertension: Secondary | ICD-10-CM

## 2013-11-21 DIAGNOSIS — E7800 Pure hypercholesterolemia, unspecified: Secondary | ICD-10-CM

## 2013-11-21 DIAGNOSIS — E119 Type 2 diabetes mellitus without complications: Secondary | ICD-10-CM

## 2013-11-21 DIAGNOSIS — I6529 Occlusion and stenosis of unspecified carotid artery: Secondary | ICD-10-CM

## 2013-11-21 DIAGNOSIS — I679 Cerebrovascular disease, unspecified: Secondary | ICD-10-CM

## 2013-11-21 DIAGNOSIS — R413 Other amnesia: Secondary | ICD-10-CM

## 2013-11-21 DIAGNOSIS — M545 Low back pain, unspecified: Secondary | ICD-10-CM

## 2013-11-21 LAB — BASIC METABOLIC PANEL
BUN: 24 mg/dL — ABNORMAL HIGH (ref 6–23)
CALCIUM: 9.9 mg/dL (ref 8.4–10.5)
CO2: 25 meq/L (ref 19–32)
CREATININE: 1.2 mg/dL (ref 0.4–1.5)
Chloride: 105 mEq/L (ref 96–112)
GFR: 61.13 mL/min (ref >60.00)
GLUCOSE: 167 mg/dL — AB (ref 70–99)
Potassium: 5.1 mEq/L (ref 3.5–5.1)
Sodium: 139 mEq/L (ref 135–145)

## 2013-11-21 LAB — HEMOGLOBIN A1C: Hgb A1c MFr Bld: 7.3 % — ABNORMAL HIGH (ref 4.6–6.5)

## 2013-11-25 NOTE — Progress Notes (Signed)
Subjective:    Patient ID: Peter Nicholson, male    DOB: 1940-11-30, 73 y.o.   MRN: [id number]  HPI 73 y/o WM here for a follow up visit... he has multiple medical problems as noted below...   ~  June 29, 2011:  6wk ROV & he has some persist leg discomfort & due to f/u w/ DrHandy soon; he's noted some mild swelling & we reviewed no salt, elevation, TEDs;  We also reviewed his medical problems, medications, XRays, & LABS today> see prob list below>>  LABS 3/13:  FLP- at goals on Cres10;  Chems- all wnl & A1c=6.6 on 615mds; CBC- Hg=11.5;  TSH=3.51;  VitD= 39;  PSA=2.75  ~  September 29, 2011:  388moOV & Peter Nicholson called usKorean the interval since his last visit w/ some hypoglycemia- BS down to 40's at night & he decreased his Metformin & Glyburide from 2Bid to 1Bid; he was also drinking Boost up to 3 cans daily; since the adjustments his sugars have normalized w/ FBS in the 70-90 range & no hypoglycemia but still doing the Boost supplements Qhs; his weight is down 3# to 191# & he is asked to stop the Boost for now & decr the Glyburide to 15m41mam only- labs pending today for further adjustments...    We reviewed prob list, meds, xrays and labs> see below >>  LABS 6/18:  Chems- BS=190 A1c=7.4 and we decided to decr the Glyburide to 15mg815m only, but keep the Metform500-2Bid & same Actos & Onglyza...  ~  January 18, 2012:  46mo 30mo& unfortunately Peter Nicholson 16# up to 206# & we reviewed low carb, low fat diet & exercise program needed to lose the weight;  BS is up to 171 & A1c up to 7.7;  We reiterated his important regular med regimen w/ Metform500-2Bid, Glyburide5Qam, Actos30, Onglyza5...  His CC today is incr reflux- w/ dysphagia & food sticking in mid-esoph that occurs every 2-3days & has to vomit to feel better "I quit eating at the cafe across the street"; he had stopped his PPI- Protonix on his own (?why?), PMHx indicates hx 4cmHH, erosive esoph in 2009 & stricture dilated in 2003; rec to restart the  Protonix40 & we will refer to GI for EGD & dilatation...    We reviewed prob list, meds, xrays and labs> see below for updates >> OK Flu shot today.  LABS 10/13:  Chems- ok x BS=171 A1c=7.7;  CBC- Hg=12.6...   Marland Kitchen  February 23, 2013:  28mo 39mo Peter Nicholson's CC is LBP & he [facility] last ?[id number] call for f/u eval; he has Tramadol for pain... We reviewed the following medical problems during today's office visit >>     OSA> he's been off CPAP x yrs & denies sleep disordered breathing, denies snoring, feels he rests well, & wakes refreshed w/o daytime hypersomnolence...    Borderline HBP> not currently on meds, just diet controlled; BP= 140/86 & reminded to avoid sodium & get weight down; he is essentially asymptomatic w/o CP, palpit, SOB, edema, etc...    Cerebrovasc dis> on ASA81 & Plavix75; known severe extracranial cerebrovasc dis w/ left CAE 2000 by DrLawson & known severe bilat cavernous ICA stenoses; he is also s/p vertebral art angioplasty x2 by DrTDeveshwar w/ known basilar art occlusion;     Chol> on Simva40 + diet; FLP 10/14 showed TChol 136, TG 54, HDL 49, LDL 77... Continue  same.    DM> on Lantus10u, Metform500-2Bid, Diabeta5, Onglyza5, off Actos; Labs 10/14 showed BS=252, A1c=9.1 & Lantus added; f/u labs 11/14 showed BS=182, A1c=8.3; continue same, get wt down.    Overweight> weight = 198# and BMI~33; we reviewed diet, exercise, wt reduction strategies...    GI- GERD, divertics> on Protonix40 daily; he had EGD 11/13 showing HH, chr GERD, stricture dil w/ 70F Maloney dilator, erosive gastritis was HPylori neg & treated w/ daily PPI rx.    GU- Hx horseshoe kidney, renal cell cancer, nephrolithiasis> right side of horseshoe kid removed 1994 ([facility]); followed by Urology for yrs & no recurrence; Creat in the 1.2-1.5 range; PSA in the 1-3 range...    DJD, Gout, LBP> on Allopurinol300, Tramadol50, Nortrip ?70m; followed by DrElsner & DrHarkins on  ESI injections & Nortrip?dose, last ?[id number]& he will call for f/u eval...    Neuro- c/o memory impairment> on ASA81 & Plavix75, plus MVI etc; he has severe intracranial atherosclerotic dis and chr cerebellar infarcts on prev scans; more recent memory problems felt to be mild cognitive impairment by DrSethi on eval 3/14 (but he is clearly high risk for vascular dementia problems)...     Anxiety> on Xanax0.5 prn...  We reviewed prob list, meds, xrays and labs> see below for updates >> he had the 2014 flu vaccine in Oct... Pt & wife reminded that he needs more reg medical follow up visits.  LABS 10/14 in Epic> REVIEWED..Marland KitchenMarland Kitchen  LABS 11/14:  Chems- OK x BS=182, A1c=8.3, BUN=29, Cr=1.2..Marland Kitchen   ~  May 24, 2013:  370moOV & DM recheck; [name] lost 5-6# down to 193# on diet + exercise; he reports home BS checks 120-200 range, feeling well & "splitting wood daily"... 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 We reviewed the following medical problems during today's office visit >>     OSA> he's been off CPAP x yrs & denies sleep disordered breathing, denies snoring, feels he rests well, & wakes refreshed w/o daytime hypersomnolence but he takes a nap every day...    Borderline HBP> not currently on meds, just diet controlled; BP= 142/84 & reminded to avoid sodium & get weight down; he is essentially asymptomatic w/o CP, palpit, SOB, edema, etc...    Cerebrovasc dis> on ASA81 & Plavix75; known severe extracranial cerebrovasc dis w/ left CAE 2000 by DrLawson & known severe bilat cavernous ICA stenoses; he is also s/p vertebral art angioplasty x2 by DrTDeveshwar w/ known  basilar art occlusion...     Chol> on Simva40 + diet; FLP 10/14 showed TChol 136, TG 54, HDL 49, LDL 77... Continue same.    DM> on Lantus10u, Metform500-2Bid, Diabeta5, Onglyza5; Labs 8/15 showed BS=167, A1c=7.3... Marland KitchenMarland Kitchenntinue meds and get wt down.    Overweight> weight = 192# and BMI~31; we reviewed diet, exercise, wt reduction strategies...    GI- GERD, divertics> on Protonix40 daily; he had EGD 11/13 showing HH, chr GERD, stricture dil w/ 70F Maloney dilator, erosive gastritis was HPylori neg & treated w/ daily PPI rx.    GU- Hx horseshoe kidney, renal cell cancer, nephrolithiasis> right side of horseshoe  kid removed 1994 ([facility]); followed by Urology for yrs & no recurrence; Creat in the 1.2-1.5 range; PSA in the 1-3 range...    DJD, Gout, LBP> on Allopurinol300, Tramadol50, Nortrip ?63m; followed by DrElsner & DrHarkins on ESI injections & Nortrip?dose, last ?[id number]& he will call for f/u eval...    Neuro- c/o memory impairment> on ASA81 & Plavix75, plus MVI etc; he has severe intracranial atherosclerotic dis and chr cerebellar infarcts on prev scans; more recent memory problems felt to be mild cognitive impairment by DrSethi on eval 3/14 (but he is clearly high risk for vascular dementia problems)...     Anxiety> on Xanax0.5 prn...  We reviewed prob list, meds, xrays and labs> see below for updates >>   LABS 8/15:  Chems- ok w/ BS=167, A1c=7.3, Cr=1.2..Marland KitchenMarland Kitchen          Problem List:     Hearing Loss >> [facility] sensor[facility] & rec to consider hearing aides...   OBSTRUCTIVE SLEEP APNEA (ICD-327.23) - s/p split night sleep study 11/08- showing an AHI of 19, assoc w/ an O2 sat down to 78%... during the CPAP trial he titrated up to a pressure of 11cm but never ret to REM sleep so optimal pressure is still ???...he uses [facility] for his CPAP needs... ~  12/10: he tells me that he has stopped the CPAP, that he sleeps better without it, not  snoring/ rests well, & wakes feeling refreshed... he refuses Sleep Med re-eval etc... ~  11/14: he's been off CPAP x yrs & denies sleep disordered breathing, denies snoring, feels he rests well, & wakes refreshed w/o daytime hypersomnolence, he naps daily...  Hx of HYPERTENSION - not currently on medications...  ~  6/12: BP= 136/80 off meds & denies HA, visual changes, CP, palipit, dizziness, syncope, dyspnea, edema, etc... he states that home BP checks are "OK". ~  CXR 11/12 showed borderline heart size, tort Ao, clear lungs w/ ?underlying COPD, NAD...  ~  1/13: [place]; both stopped 1/13 by TP w/ renal insuffic & elev K+; subseq improved & BP stable... ~  2/13: BP= 112/68 & feeling weak etc...  3/13:  BP= 140/72 & feeling some better... ~  6/13:  BP= 138/78 & feeling much better; denies CP, palpit, ch in SOB or edema... ~  10/13:  BP= 140/82 & he is c/o reflux & dysphagia today, no CP/ palpit/ SOB/ edema... ~  11/14: not currently on meds, just diet controlled; BP= 140/86 & reminded to avoid sodium & get weight down; he is essentially asymptomatic w/o CP, palpit, SOB, edema, etc. ~  2/15: still not on meds, just diet controlled; BP= 124/76 & he remains asymptomatic...   CEREBROVASCULAR DISEASE (ICD-437.9) - stable on ASA 3237md & PLAVIX 7561m per DrDeveshwar/ DrLawson/ DrReynolds... he has severe extracranial cerebrovasc disease- esp in the post circulation, and he is s/p vertebral art angioplasty x 2, and w/ a known basilar art occlusion- prev on Coumadin controlled by Neurology (switched to ASA/ Plavix in 2010)... also followed by DrLSheryn Bisond DrDeveshwar for IR... [facility] ~  CDoppler 2/07 showed 0-39% bilat ICA stenoses, s/p left CAE w/ DPA... ~  eval 3/10 by DrLSheryn Bison CDopplers that showed similar patency w/ 0-39% bilat ICA stenoses; due to the 10 yr interval DrLawson did not feel he needed any additional  follow up. ~  12/10: continued f/u DrDevershwar IR w/ MRI/ MRAs showing bilat carotid siphon region atherosclerosis & narrowing (unchanged), and markedly diseased distal left vertebral & prox basilar art narrowing (no change)... ~  11/12: CTA head & neck showed similar severe dis w/ mod to severe bilat cavernous ICA stenoses due to extensive atherosclerosis; severe distal vertebral atherosclerosis- distal right occluded, distal left w/ high grade stenosis; mod to severe bilat PCA stenoses; chr cerebellar infarcts, no acute infarcts. ~  11/14: on ASA81 & Plavix75; known severe extracranial cerebrovasc dis w/ left CAE 2000 by DrLawson & known severe bilat cavernous ICA stenoses; he is also s/p vertebral art angioplasty x2 by DrTDeveshwar w/ known basilar art occlusion.  VENOUS INSUFFICIENCY (ICD-459.81) - he knows to avoid sodium, elevate legs, wear support hose...  HYPERCHOLESTEROLEMIA (ICD-272.0) - on CRESTOR 2m/d since Hosp 12/12... ~  [place] showed TChol 153, TG 120, HDL 36, LDL 93 ~  FLP 8/09 showed TChol 131, TG 84, HDL 38, LDL 76 ~  2010:  pt was not fasting for blood work... rec to continue Crestor10 + diet efforts. ~  FLP 10/11 showed TChol 210, TG 161, HDL 40, LDL 149... rec to start Crestor 26md, he will decide. ~  FLP 6/12 on Cres20 showed TChol 106, TG 95, HDL 41, LDL 46... Continue same. ~  [place] showed TChol 100, TG 101, HDL 37, LDL 43... They decr to Cres10. ~  FLP 3/13 on Cres10 showed TChol 118, TG 67, HDL 44, LDL 61... Continue same, later switched to Simva40 for $$ reasons. ~  FLP 10/14 on Simva40 showed TChol 136, TG 54, HDL 49, LDL 77... Continue same.  DIABETES MELLITUS (ICD-250.00) - on METFORMIN 50032m2Bid, GLYBURIDE 5mg46m, ACTOS 30mg59m& ONGLYZA 5mg/d34m ~  labs 6/08 showed BS= 155, HgA1c= 8.1... (glMarland Kitchenburide incr to 5Bid). ~  labs 11/08 showed BS= 352, HgA1c= 8.1.... (Actos added). ~  labs 8/09 showed BS= 128, HgA1c= 7.1... recMarland KitchenMarland Kitchensame  meds, better diet, get wt down! ~  labs 2/10 showed BS= 249, A1c= 7.3.... Metform incr to 2Bid. ~  labs 12/10 (nonfasting- on all 3 meds) showed BS= 87, A1c= 6.9 ~  labs 10/11 on Metform1000Bid+Gybur5Bid showed BS= 180, A1c= 8.4... recMarland KitchenMarland Kitcheno incr GLYBUR10Bid + diet. ~  labs 6/12 on Metform1000Bid+Glybur10Bid showed BS= 196, A1c= 11.2 ?what happened?> he declined insulin & Endocrine consult, opted for max oral meds by adding ACTOS 30mg/d11mNGLYZA 5mg/d..84m  Labs 12/12 in Hosp sho[place]BS 150-200+ and A1c= 7.2; covered w/ SSI in hosp & placed back on 4 oral meds on disch from rehab... ~  Labs 1/13 on all 4 meds showed BS= 230, A1c=7.1; we reviewed diet & he will start PT soon... ~  Labs 3/13 on 4meds sh66md BS= 72, A1c= 6.6 ~  4/13: pt called w/ hypoglycemia & we decreased Glyburide 5mg from 63md to 1Bid... ~  6/13:  Pt on Metform500-2Bid, Glybur5mgBid, Ac70m30, Onglyza5; BS at home 70-90 he says & still drinking Boost; Labs showed BS=190  A1c=7.4  ; we decided to cut the Glyburide5mg Qam onl14mut keep the Metform500-2Bid +Actos +Onglyza... ~  10/13: on his 4meds> BS=1791mA1c=7.7 but he's gained 16#; told to STOP the boost, get on diet, get wt down, take all meds every day... ~  10/14: seen by TP> Labs 10/14 showed BS=252, A1c=9.1 & Lantus added to his Metform500-2Bid, Diabeta5, Onglyza5; Lantus10u added... ~  11/14: on Lantus10u, Metform500-2Bid, Diabeta5, Onglyza5, off  Actos; f/u labs 11/14 showed BS=182, A1c=8.3; continue same, get wt down. ~  8/15: on Lantus10u, Metform500-2Bid, Diabeta5, Onglyza5; Labs 8/15 showed BS=167, A1c=7.3.Marland KitchenMarland Kitchen Continue meds and get wt down.  OVERWEIGHT (ICD-278.02) - he states that he is not on much of a diet & we reviewed low carb/ no sweets/ low fat diet. ~  weight 8/09 = 216# ~  weight 2/10 = 216# ~  weight 12/10 = 212# ~  weight 10/11 = 210# ~  Weight 6/12 = 206# and he admits to not being on much of a diet... ~  Weight 2/13 = non weight bearing still... ~  Weight  3/13 = 193# ~  Weight 6/13 = 191# ~  Weight 10/13 = 206# ~  Weight 11/14 = 198# ~  Weight 2/15 = 193#  GERD (ICD-530.81) - prev on Nexium40, he switched to OTC Prilosec, now [place] 23m/d due to Plavix Rx; pt had stopped this on his own & asked to restart 10/13. ~  EGD 19/03 showed [place] GERD & esoph stricture dilated... ~  EGD 12/09 by DrPatterson showed HH & severe erosive esophagitis from GERD... ~  10/13: presents w/ incr reflux- w/ dysphagia & food sticking in mid-esoph that occurs every 2-3days & has to vomit to feel better; he had stopped his PPI on his own (?why?), rec to restart the Protonix40 & we will refer to GI for EGD & dilatation... ~  10/13: he saw DrPatterson> EGD 11/13 showed HH, chr GERD, stricture dil w/ 65F Maloney dilator, erosive gastritis was HPylori neg & treated w/ daily PPI rx...   DIVERTICULOSIS OF COLON (ICD-562.10) - [place]: [place]; prev on [place] recently noted loose stools on the Metformin 10096mid... ~  colonoscopy 10/03 by DrPatterson showed divertics only... ~  colonoscopy 12/09 by DrPatterson was also normal x for divertics...  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 Hx of RENAL CELL CANCER (ICD-189.0) - he had a partial nephrectomy of the right side of his horseshoe kidney in 1994 due to renal cell ca... still followed by DrSligoearly by his hx but we don't have any recent notes... ~  labs 10/11 showed PSA= 0.95 ~  Labs 3/13 showed BUN=21, Creat=1.1, PSA=2.75 ~  Labs 10/13 showed BUN= 25, Creat= 1.2 ~  Labs 10/14 showed BUN=38, Cr=1.5 (w/ enterococcus UTI);  Labs 11/14 showed BUN=29, Cr=1.2 ~  Labs 8/15 showed BUN= 24, Cr= 1.2  Hx of NEPHROLITHIASIS (ICD-592.0) - remote hx of kidney stones followed by DrPeterson...  DEGENERATIVE JOINT DISEASE (ICD-715.90) &  GOUT (ICD-274.9) - on ALLOPURINOL 30062m... ~  labs 10/11 showed URIC=  4.7 ~  [facility] after fall from ladder cleaning gutters w/ complex right tibial plat fx- ORIF, 2 operations by DrHardy, & hosp course complic by ileus, TIA, electrolyte abn, etc;  Event disch to Rehab & [facility]  BACK PAIN, LUMBAR (ICD-724.2) - known degenerative spondylitic disease at L4-5 and L5-S1 prev [facility] (now DrElsner & DrHarkins)... ~  6/12: he reports further back pain w/ eval by Chiropractor showing L4 disc prob & regular adjustments; but now notes incr pain & he requests referral to Neurosurg for further eval ==> seen by DrElsner, then DrHarkins for shots which has helped... ~  2014: he has continued f/u w/ DrHarkins/ [facility] & received periodic ESI injections for his Lumbar DDD; they  added NORTRIPTYLINE 65mQhs & slowly incr the dose  NEURO >> Hx Left Hemispheric TIA in 11/12 & MEMORY IMPAIRMENT ~  3/14:  He had Neuro eval DrSethi> he did Labs, EEG, MRI Brain, & MRA Brain & Neck; Rec to take 2 FishOil caps daily, CerefolinNAC daily, work puzzles etc, continue Plavix & strict control of BP, Chol, DM; they did not start Aricept...  ANXIETY (ICD-300.00) - uses ALPRAZOLAM 0.510mas needed.   Past Surgical History  Procedure Laterality Date  . Incisional hernia repair  1/96    Dr.Leone  . Nasal sinus surgery  12/96    Dr.Redman  . Cholecystectomy  9/04    Dr. LeRebekah [place]. 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 Carotid endarterectomy  09/2000    Dr.Lawson  . Veterbral art angioplasty      x2  . Small intestine surgery    . External fixation leg  03/10/2011    Procedure: EXTERNAL FIXATION LEG;  Surgeon: [place]Rozanna Box [place]ics;  Laterality: Right;  . Orif tibia plateau  03/24/2011    Procedure: OPEN REDUCTION INTERNAL FIXATION (ORIF) TIBIAL PLATEAU;  Surgeon: MiRozanna Box [place] Service: [place]ics;  Laterality: Right;    Outpatient  Encounter Prescriptions as of 11/21/2013  Medication Sig  . allopurinol (ZYLOPRIM) 300 MG tablet TAKE 1 TABLET (300 MG TOTAL) BY MOUTH DAILY.  . Marland KitchenLPRAZolam (XANAX) 0.5 MG tablet TAKE ONE-HALF TO ONE TABLET BY MOUTH THREE TIMES DAILY AS NEEDED FOR NERVES  . aspirin 325 MG EC tablet Take 325 mg by mouth daily.    . Marland Kitchen complex vitamins tablet Take 1 tablet by mouth daily.  . Blood Glucose Monitoring Suppl (ONE TOUCH ULTRA SYSTEM KIT) W/DEVICE KIT Test blood sugar up to 3 times daily  . cholecalciferol (VITAMIN D) 1000 UNITS tablet Take 1,000 Units by mouth daily.  . clopidogrel (PLAVIX) 75 MG tablet TAKE 1 TABLET (75 MG TOTAL) BY MOUTH DAILY.  . Marland Kitchenlucose blood test strip Test blood sugar up to 3 times daily  . glyBURIDE (DIABETA) 5 MG tablet TAKE 1 TABLET (5 MG TOTAL) BY MOUTH DAILY WITH BREAKFAST.  . Marland Kitchennsulin Glargine (LANTUS SOLOSTAR) 100 UNIT/ML Solostar Pen Inject 10 Units into the skin at bedtime.  . Insulin Pen Needle 31G X 8 MM MISC Use with Lantus Solostar Pen once daily as directed  . metFORMIN (GLUCOPHAGE) 500 MG tablet Take 2 tablets by mouth two times daily  . Multiple Vitamin (MULTIVITAMIN) tablet Take 1 tablet by mouth daily.    . pantoprazole (PROTONIX) 40 MG tablet TAKE 1 TABLET (40 MG TOTAL) BY MOUTH DAILY AT 12 NOON.  . saxagliptin HCl (ONGLYZA) 5 MG TABS tablet Take 1 tablet (5 mg total) by mouth daily.  . sertraline (ZOLOFT) 50 MG tablet Take 1 tablet (50 mg total) by mouth at bedtime.  . simvastatin (ZOCOR) 40 MG tablet TAKE ONE TABLET BY MOUTH AT BEDTIME  . traMADol (ULTRAM) 50 MG tablet Take 50 mg by mouth every 6 (six) hours as needed for pain.   . [DISCONTINUED] metFORMIN (GLUCOPHAGE) 500 MG tablet TAKE 2 TABLETS BY MOUTH TWO TIMES DAILY    Allergies  Allergen Reactions  . Dilaudid [Hydromorphone Hcl]   . Promethazine Hcl     REACTION: hallucinations    Current Medications, Allergies, Past Medical History, Past Surgical History, Family History, and Social History were  reviewed in [employer]  record.    Review of Systems        See HPI - all other systems neg except as noted...  The patient complains of dyspnea on exertion.  The patient denies anorexia, fever, weight loss, weight gain, vision loss, decreased hearing, hoarseness, chest pain, syncope, peripheral edema, prolonged cough, headaches, hemoptysis, abdominal pain, melena, hematochezia, severe indigestion/heartburn, hematuria, incontinence, muscle weakness, suspicious skin lesions, transient blindness, difficulty walking, depression, unusual weight change, abnormal bleeding, enlarged lymph nodes, and angioedema.     Objective:   Physical Exam     WD, WN, 73 y/o WM in NAD...  GENERAL:  Alert & oriented; pleasant & cooperative... HEENT:  [place]/AT, EOM-wnl, PERRLA, EACs-clear, TMs-wnl, NOSE-clear, THROAT-clear & wnl. NECK:  Supple w/ fairROM; no JVD; s/p left CAE, +bruits; no thyromegaly or nodules palpated; no lymphadenopathy. CHEST:  Clear to P & A; without wheezes/ rales/ or rhonchi heard... HEART:  Regular Rhythm; gr 1/6 SEM without rubs or gallops detected... ABDOMEN:  Soft & nontender; normal bowel sounds; no organomegaly or masses palpated... EXT: right knee deformity s/p ORIF, mod arthritic changes; no varicose veins/ +venous insuffic/ 1+ edema. NEURO:  CN's intact; no focal neuro deficits... DERM:  No lesions noted; no rash etc...  RADIOLOGY DATA:  Reviewed in the EPIC EMR & discussed w/ the patient...    >>CXR 11/12 showed borderline heart size, tortuous aorta, clear & NAD...    >>EKG 12/12 showed NSR, rate92, NSSTTWA...  LABORATORY DATA:  Reviewed in the EPIC EMR & discussed w/ the patient...   Assessment & Plan:    DM>  On Lantus10, Metform, Diabeta, Onglyza & control is improved... Discussed diet & wt reduction...   CEREBROVASC Disease & TIA 12/12>  Known severe cerebrovasc dis & he had TIA while in hosp for fx leg (he was off plavix at the time); treated w/  Lovenox perioperatively but he hasn't yet restarted the Plavix; He is w/o cerebral ischemic symptoms on his Rx, s/p left CAE in 2000 & 2 vertebral art angioplasties in 2001 & 2002... NEURO> Mild Cognitive Impairment & he is at risk for vascular dementia; rec to maintain careful medical followup of BP, Chol, DM, & get the weight down!  CHOL>  on Simva40 now & FLP looks good...  Overweight>  We discussed low carb, low fat wt reducing diet...  GI>  GERD prev controlled w/ Protonix but he stopped on his own (?why?); EGD 11/13 w/ dilatation & reminded to take the Protonix40 every day...  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  DJD/ Hx Gout/ LBP>  He was seeing chiropractor regularly regarding LBP & then f/u w/ DrElsner,Neurosurg & had ESI by DrHarkins & the shots have helped...  s/p fall w/ right Tibial plateaux fx> 1st ext fixation, 2nd ORIF>  By DrHardy, Ortho; exercise is difficult but very much needed; he's had extensive PT training...  Anxiety>  He remains on Alpraz Prn...   Patient's Medications  New Prescriptions   No medications on file  Previous Medications   ALLOPURINOL (ZYLOPRIM) 300 MG TABLET    TAKE 1 TABLET (300 MG TOTAL) BY MOUTH DAILY.   ALPRAZOLAM (XANAX) 0.5 MG TABLET    TAKE ONE-HALF TO ONE TABLET BY MOUTH THREE TIMES DAILY AS NEEDED FOR NERVES   ASPIRIN 325 MG EC TABLET    Take 325 mg by mouth daily.     B COMPLEX VITAMINS TABLET    Take 1 tablet by  mouth daily.   BLOOD GLUCOSE MONITORING SUPPL (ONE TOUCH ULTRA SYSTEM KIT) W/DEVICE KIT    Test blood sugar up to 3 times daily   CHOLECALCIFEROL (VITAMIN D) 1000 UNITS TABLET    Take 1,000 Units by mouth daily.   CLOPIDOGREL (PLAVIX) 75 MG TABLET    TAKE 1 TABLET (75 MG TOTAL) BY MOUTH DAILY.   GLUCOSE BLOOD TEST STRIP    Test blood sugar up to 3 times daily   GLYBURIDE (DIABETA) 5 MG TABLET    TAKE 1 TABLET (5 MG TOTAL) BY MOUTH DAILY WITH BREAKFAST.   INSULIN GLARGINE  (LANTUS SOLOSTAR) 100 UNIT/ML SOLOSTAR PEN    Inject 10 Units into the skin at bedtime.   INSULIN PEN NEEDLE 31G X 8 MM MISC    Use with Lantus Solostar Pen once daily as directed   METFORMIN (GLUCOPHAGE) 500 MG TABLET    Take 2 tablets by mouth two times daily   MULTIPLE VITAMIN (MULTIVITAMIN) TABLET    Take 1 tablet by mouth daily.     PANTOPRAZOLE (PROTONIX) 40 MG TABLET    TAKE 1 TABLET (40 MG TOTAL) BY MOUTH DAILY AT 12 NOON.   SAXAGLIPTIN HCL (ONGLYZA) 5 MG TABS TABLET    Take 1 tablet (5 mg total) by mouth daily.   SERTRALINE (ZOLOFT) 50 MG TABLET    Take 1 tablet (50 mg total) by mouth at bedtime.   SIMVASTATIN (ZOCOR) 40 MG TABLET    TAKE ONE TABLET BY MOUTH AT BEDTIME   TRAMADOL (ULTRAM) 50 MG TABLET    Take 50 mg by mouth every 6 (six) hours as needed for pain.   Modified Medications   No medications on file  Discontinued Medications   METFORMIN (GLUCOPHAGE) 500 MG TABLET    TAKE 2 TABLETS BY MOUTH TWO TIMES DAILY

## 2014-01-20 ENCOUNTER — Other Ambulatory Visit: Payer: Self-pay | Admitting: Pulmonary Disease

## 2014-01-24 ENCOUNTER — Telehealth: Payer: Self-pay

## 2014-01-24 NOTE — Telephone Encounter (Signed)
Left a message for call back.  Called patient regarding diabetic eye exam.  When patient calls back please ask:  Have you had a recent (2014-2015) eye exam?    Date of Exam?  Where?    

## 2014-02-01 ENCOUNTER — Other Ambulatory Visit: Payer: Self-pay | Admitting: Adult Health

## 2014-02-25 ENCOUNTER — Encounter (HOSPITAL_COMMUNITY): Payer: Self-pay | Admitting: Emergency Medicine

## 2014-02-25 ENCOUNTER — Inpatient Hospital Stay (HOSPITAL_COMMUNITY)
Admission: EM | Admit: 2014-02-25 | Discharge: 2014-02-28 | DRG: 066 | Disposition: A | Discharge status: Home / Self Care | Payer: Medicare Other | Attending: Internal Medicine | Admitting: Internal Medicine

## 2014-02-25 ENCOUNTER — Emergency Department (HOSPITAL_COMMUNITY): Payer: Medicare Other

## 2014-02-25 DIAGNOSIS — G4733 Obstructive sleep apnea (adult) (pediatric): Secondary | ICD-10-CM | POA: Diagnosis present

## 2014-02-25 DIAGNOSIS — F411 Generalized anxiety disorder: Secondary | ICD-10-CM | POA: Diagnosis present

## 2014-02-25 DIAGNOSIS — M545 Low back pain: Secondary | ICD-10-CM | POA: Diagnosis present

## 2014-02-25 DIAGNOSIS — Z6830 Body mass index (BMI) 30.0-30.9, adult: Secondary | ICD-10-CM

## 2014-02-25 DIAGNOSIS — Z85528 Personal history of other malignant neoplasm of kidney: Secondary | ICD-10-CM

## 2014-02-25 DIAGNOSIS — Z8673 Personal history of transient ischemic attack (TIA), and cerebral infarction without residual deficits: Secondary | ICD-10-CM | POA: Insufficient documentation

## 2014-02-25 DIAGNOSIS — I63132 Cerebral infarction due to embolism of left carotid artery: Secondary | ICD-10-CM | POA: Diagnosis not present

## 2014-02-25 DIAGNOSIS — Z8249 Family history of ischemic heart disease and other diseases of the circulatory system: Secondary | ICD-10-CM

## 2014-02-25 DIAGNOSIS — Z7902 Long term (current) use of antithrombotics/antiplatelets: Secondary | ICD-10-CM

## 2014-02-25 DIAGNOSIS — G459 Transient cerebral ischemic attack, unspecified: Secondary | ICD-10-CM | POA: Diagnosis present

## 2014-02-25 DIAGNOSIS — Z794 Long term (current) use of insulin: Secondary | ICD-10-CM

## 2014-02-25 DIAGNOSIS — E7800 Pure hypercholesterolemia, unspecified: Secondary | ICD-10-CM | POA: Diagnosis present

## 2014-02-25 DIAGNOSIS — M1990 Unspecified osteoarthritis, unspecified site: Secondary | ICD-10-CM | POA: Diagnosis present

## 2014-02-25 DIAGNOSIS — E785 Hyperlipidemia, unspecified: Secondary | ICD-10-CM | POA: Diagnosis present

## 2014-02-25 DIAGNOSIS — Z888 Allergy status to other drugs, medicaments and biological substances status: Secondary | ICD-10-CM

## 2014-02-25 DIAGNOSIS — M109 Gout, unspecified: Secondary | ICD-10-CM | POA: Diagnosis present

## 2014-02-25 DIAGNOSIS — I639 Cerebral infarction, unspecified: Secondary | ICD-10-CM

## 2014-02-25 DIAGNOSIS — N289 Disorder of kidney and ureter, unspecified: Secondary | ICD-10-CM | POA: Diagnosis present

## 2014-02-25 DIAGNOSIS — E119 Type 2 diabetes mellitus without complications: Secondary | ICD-10-CM | POA: Diagnosis present

## 2014-02-25 DIAGNOSIS — R471 Dysarthria and anarthria: Secondary | ICD-10-CM | POA: Diagnosis not present

## 2014-02-25 DIAGNOSIS — Z7982 Long term (current) use of aspirin: Secondary | ICD-10-CM

## 2014-02-25 DIAGNOSIS — IMO0002 Reserved for concepts with insufficient information to code with codable children: Secondary | ICD-10-CM

## 2014-02-25 DIAGNOSIS — Z885 Allergy status to narcotic agent status: Secondary | ICD-10-CM

## 2014-02-25 DIAGNOSIS — I10 Essential (primary) hypertension: Secondary | ICD-10-CM | POA: Diagnosis present

## 2014-02-25 DIAGNOSIS — E669 Obesity, unspecified: Secondary | ICD-10-CM | POA: Diagnosis present

## 2014-02-25 DIAGNOSIS — R000 Tachycardia, unspecified: Secondary | ICD-10-CM

## 2014-02-25 LAB — COMPREHENSIVE METABOLIC PANEL
ALT: 16 U/L (ref 0–53)
AST: 18 U/L (ref 0–37)
Albumin: 3.5 g/dL (ref 3.5–5.2)
Alkaline Phosphatase: 101 U/L (ref 39–117)
Anion gap: 15 (ref 5–15)
BUN: 25 mg/dL — ABNORMAL HIGH (ref 6–23)
CO2: 24 mEq/L (ref 19–32)
Calcium: 9.9 mg/dL (ref 8.4–10.5)
Chloride: 101 mEq/L (ref 96–112)
Creatinine, Ser: 1.29 mg/dL (ref 0.50–1.35)
GFR calc non Af Amer: 53 mL/min — ABNORMAL LOW (ref >90)
GFR, EST AFRICAN AMERICAN: 61 mL/min — AB (ref >90)
Glucose, Bld: 162 mg/dL — ABNORMAL HIGH (ref 70–99)
Potassium: 4.2 mEq/L (ref 3.7–5.3)
SODIUM: 140 meq/L (ref 137–147)
TOTAL PROTEIN: 7 g/dL (ref 6.0–8.3)
Total Bilirubin: 0.2 mg/dL — ABNORMAL LOW (ref 0.3–1.2)

## 2014-02-25 LAB — I-STAT CHEM 8, ED
BUN: 27 mg/dL — ABNORMAL HIGH (ref 6–23)
CREATININE: 1.4 mg/dL — AB (ref 0.50–1.35)
Calcium, Ion: 1.27 mmol/L (ref 1.13–1.30)
Chloride: 103 mEq/L (ref 96–112)
Glucose, Bld: 163 mg/dL — ABNORMAL HIGH (ref 70–99)
HEMATOCRIT: 40 % (ref 39.0–52.0)
HEMOGLOBIN: 13.6 g/dL (ref 13.0–17.0)
POTASSIUM: 4 meq/L (ref 3.7–5.3)
Sodium: 140 mEq/L (ref 137–147)
TCO2: 25 mmol/L (ref 0–100)

## 2014-02-25 LAB — DIFFERENTIAL
BASOS ABS: 0.1 10*3/uL (ref 0.0–0.1)
Basophils Relative: 1 % (ref 0–1)
Eosinophils Absolute: 0.3 10*3/uL (ref 0.0–0.7)
Eosinophils Relative: 3 % (ref 0–5)
Lymphocytes Relative: 16 % (ref 12–46)
Lymphs Abs: 1.6 10*3/uL (ref 0.7–4.0)
Monocytes Absolute: 0.5 10*3/uL (ref 0.1–1.0)
Monocytes Relative: 6 % (ref 3–12)
Neutro Abs: 7.4 10*3/uL (ref 1.7–7.7)
Neutrophils Relative %: 74 % (ref 43–77)

## 2014-02-25 LAB — CBC
HCT: 39.5 % (ref 39.0–52.0)
Hemoglobin: 13.2 g/dL (ref 13.0–17.0)
MCH: 32 pg (ref 26.0–34.0)
MCHC: 33.4 g/dL (ref 30.0–36.0)
MCV: 95.9 fL (ref 78.0–100.0)
PLATELETS: 246 10*3/uL (ref 150–400)
RBC: 4.12 MIL/uL — ABNORMAL LOW (ref 4.22–5.81)
RDW: 11.9 % (ref 11.5–15.5)
WBC: 9.9 10*3/uL (ref 4.0–10.5)

## 2014-02-25 LAB — APTT: aPTT: 26 seconds (ref 24–37)

## 2014-02-25 LAB — ETHANOL: Alcohol, Ethyl (B): <11 mg/dL (ref 0–11)

## 2014-02-25 LAB — PROTIME-INR
INR: 1.01 (ref 0.00–1.49)
PROTHROMBIN TIME: 13.4 s (ref 11.6–15.2)

## 2014-02-25 LAB — I-STAT TROPONIN, ED: Troponin i, poc: 0.01 ng/mL (ref 0.00–0.08)

## 2014-02-25 NOTE — ED Notes (Signed)
Pt placed in a gown, hooked up to th cardiac monitor, blood pressure cuff on and pulse ox on

## 2014-02-25 NOTE — ED Notes (Signed)
Dr Venora Maples seen patient in triage.

## 2014-02-25 NOTE — ED Notes (Signed)
Patient with trouble getting his words out and having trouble looking for items around the house.out.  Patient has equal hand grips and pedal pushes.

## 2014-02-25 NOTE — Progress Notes (Signed)
73 y.o male arrived to ED per private vehicle with wife, complaining of difficulty finding words. Pt brought from triage STAT to CT scan. CT negative per Neurologist. Pertinent history includes DM,Stroke, high cholesterol, HTN, anxiety. Pt has been prescribed aspirin and plavix daily but wife unsure if he takes them. NIHSS completed yielding score of 4. Pt able to have a conversation but has moderate difficulty with object recognition, scored 1 for aphasia. Mild ataxia in upper and lower limbs and partial vision impairment on left side. Per patient he began having difficulty finding words around 2 pm this afternoon, however his wife at bedside believes his speech impairment began around 9pm. At that time she was sleeping but a family member woke her up and notified her of his concern at that time. Due to patient's mild symptoms and discrepancy of LSN time he is not a candidate for TPA  per Dr.Camilo. Pt to be admitted for full stroke work up. Stroke swallow study to be completed per ED RN.

## 2014-02-25 NOTE — ED Provider Notes (Signed)
CSN: [id number]     Arrival date & time 02/25/14  2255 History   First MD Initiated Contact with Patient 02/25/14 2313     Chief Complaint  Patient presents with  . Code Stroke    An emergency department physician performed an initial assessment on this suspected stroke patient at 2300.   HPI Pt was seen at 2315. Per pt and his family: c/o sudden onset and gradual improvement of "trouble getting his words out" that began this afternoon. Pt states he began to have trouble with his speech approximately 1400 today, but pt's wife states his speech difficulty began approximately 2130 PTA. Pt denies any other symptoms. No dysphagia, no ataxia, no focal motor weakness, no tingling/numbness in extremities, no facial droop, no headache, no visual changes. Denies CP/palpitations, no SOB/cough, no abd pain, no N/V/D, no syncope/near syncope.   Past Medical History  Diagnosis Date  . Obstructive sleep apnea (adult) (pediatric)     doesnt wear CPAP  . HTN (hypertension)   . Cerebrovascular disease, unspecified   . Unspecified venous (peripheral) insufficiency   . Pure hypercholesterolemia   . Type II or unspecified type diabetes mellitus without mention of complication, not stated as uncontrolled   . Overweight(278.02)   . Diaphragmatic hernia without mention of obstruction or gangrene   . Esophageal reflux   . Diverticulosis of colon (without mention of hemorrhage)   . Other specified congenital anomaly of kidney   . Kidney carcinoma   . Calculus of kidney   . Gout, unspecified   . Lumbago   . Anxiety state, unspecified   . History of gallstones   . IBS (irritable bowel syndrome)   . History of seizures   . Bowel obstruction   . Osteoarthrosis, unspecified whether generalized or localized, unspecified site     tendonitis  . Blood transfusion without reported diagnosis   . Stroke     2001  . Seizures     last seizure November 2012   Past Surgical History  Procedure Laterality Date  .  Incisional hernia repair  1/96    Dr.Leone  . Nasal sinus surgery  12/96    Dr.Redman  . Cholecystectomy  9/04    Dr. Rebekah Chesterfield  . 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 Carotid endarterectomy  09/2000    Dr.Lawson  . Veterbral art angioplasty      x2  . Small intestine surgery    . External fixation leg  03/10/2011    Procedure: EXTERNAL FIXATION LEG;  Surgeon: Rozanna Box;  Location: [place];  Service: Orthopedics;  Laterality: Right;  . Orif tibia plateau  03/24/2011    Procedure: OPEN REDUCTION INTERNAL FIXATION (ORIF) TIBIAL PLATEAU;  Surgeon: Rozanna Box;  Location: [place];  Service: Orthopedics;  Laterality: Right;   Family History  Problem Relation Age of Onset  . Heart attack Father   . Dementia Mother   . Ovarian cancer Paternal Grandmother   . Breast cancer Sister   . Colon cancer Neg Hx   . Esophageal cancer Neg Hx   . Rectal cancer Neg Hx   . Stomach cancer Neg Hx    History  Substance Use Topics  . Smoking status: Never Smoker   . Smokeless tobacco: Never Used  . Alcohol Use: Yes     Comment: Very occasional use    Review of Systems ROS: Statement: All systems negative except as marked or noted in the  HPI; Constitutional: Negative for fever and chills. ; ; Eyes: Negative for eye pain, redness and discharge. ; ; ENMT: Negative for ear pain, hoarseness, nasal congestion, sinus pressure and sore throat. ; ; Cardiovascular: Negative for chest pain, palpitations, diaphoresis, dyspnea and peripheral edema. ; ; Respiratory: Negative for cough, wheezing and stridor. ; ; Gastrointestinal: Negative for nausea, vomiting, diarrhea, abdominal pain, blood in stool, hematemesis, jaundice and rectal bleeding. . ; ; Genitourinary: Negative for dysuria, flank pain and hematuria. ; ; Musculoskeletal: Negative for back pain and neck pain. Negative for swelling and trauma.; ; Skin: Negative for pruritus, rash, abrasions, blisters, bruising and skin  lesion.; ; Neuro: +aphasia. Negative for headache, lightheadedness and neck stiffness. Negative for weakness, altered level of consciousness , altered mental status, extremity weakness, paresthesias, involuntary movement, seizure and syncope.      Allergies  Dilaudid and Promethazine hcl  Home Medications   Prior to Admission medications   Medication Sig Start Date End Date Taking? Authorizing Provider  allopurinol (ZYLOPRIM) 300 MG tablet TAKE 1 TABLET (300 MG TOTAL) BY MOUTH DAILY.    Melvenia Needles, NP  ALPRAZolam (XANAX) 0.5 MG tablet TAKE ONE-HALF TO ONE TABLET BY MOUTH THREE TIMES DAILY AS NEEDED FOR NERVES 11/01/13   Noralee Space, MD  aspirin 325 MG EC tablet Take 325 mg by mouth daily.      Historical Provider, MD  b complex vitamins tablet Take 1 tablet by mouth daily.    Historical Provider, MD  Blood Glucose Monitoring Suppl (ONE TOUCH ULTRA SYSTEM KIT) W/DEVICE KIT Test blood sugar up to 3 times daily 07/28/13   Noralee Space, MD  cholecalciferol (VITAMIN D) 1000 UNITS tablet Take 1,000 Units by mouth daily.    Historical Provider, MD  clopidogrel (PLAVIX) 75 MG tablet TAKE 1 TABLET (75 MG TOTAL) BY MOUTH DAILY. 04/10/13   Noralee Space, MD  glucose blood test strip Test blood sugar up to 3 times daily 07/28/13   Noralee Space, MD  glyBURIDE (DIABETA) 5 MG tablet TAKE 1 TABLET (5 MG TOTAL) BY MOUTH DAILY WITH BREAKFAST. 10/11/13   Noralee Space, MD  Insulin Glargine (LANTUS SOLOSTAR) 100 UNIT/ML Solostar Pen Inject 10 Units into the skin at bedtime. 08/30/13   Noralee Space, MD  Insulin Pen Needle 31G X 8 MM MISC Use with Lantus Solostar Pen once daily as directed 01/19/13   Tammy S Parrett, NP  metFORMIN (GLUCOPHAGE) 500 MG tablet Take 2 tablets by mouth two times daily 06/15/12   Noralee Space, MD  metFORMIN (GLUCOPHAGE) 500 MG tablet TAKE 2 TABLETS BY MOUTH TWO TIMES DAILY 02/01/14   Noralee Space, MD  Multiple Vitamin (MULTIVITAMIN) tablet Take 1 tablet by mouth daily.       Historical Provider, MD  pantoprazole (PROTONIX) 40 MG tablet TAKE 1 TABLET (40 MG TOTAL) BY MOUTH DAILY AT 12 NOON. 04/10/13   Noralee Space, MD  saxagliptin HCl (ONGLYZA) 5 MG TABS tablet Take 1 tablet (5 mg total) by mouth daily. 05/15/11   Noralee Space, MD  sertraline (ZOLOFT) 50 MG tablet TAKE 1 TABLET BY MOUTH EVERY DAY AT BEDTIME 01/21/14   Noralee Space, MD  simvastatin (ZOCOR) 40 MG tablet TAKE ONE TABLET BY MOUTH AT BEDTIME 05/24/13   Noralee Space, MD  traMADol (ULTRAM) 50 MG tablet Take 50 mg by mouth every 6 (six) hours as needed for pain.  07/08/12   Historical Provider, MD   BP  153/87 mmHg  Pulse 113  Temp(Src) 98.7 F (37.1 C) (Oral)  Resp 21  Ht _0  (1.676 m)  Wt 190 lb (86.183 kg)  BMI 30.68 kg/m2  SpO2 98% Physical Exam  2320: Physical examination:  Nursing notes reviewed; Vital signs and O2 SAT reviewed;  Constitutional: Well developed, Well nourished, Well hydrated, In no acute distress; Head:  Normocephalic, atraumatic; Eyes: EOMI, PERRL, No scleral icterus; ENMT: Mouth and pharynx normal, Mucous membranes moist; Neck: Supple, Full range of motion, No lymphadenopathy; Cardiovascular: Tachycardic rate and rhythm, No gallop; Respiratory: Breath sounds clear & equal bilaterally, No wheezes.  Speaking full sentences with ease, Normal respiratory effort/excursion; Chest: Nontender, Movement normal; Abdomen: Soft, Nontender, Nondistended, Normal bowel sounds; Genitourinary: No CVA tenderness; Extremities: Pulses normal, No tenderness, No edema, No calf edema or asymmetry.; Neuro: AA&Ox3, No facial droop. Major CN grossly intact.  Speech clear. +expressive aphasia. No drift x4 extremities. No gross focal motor or sensory deficits in extremities.; Skin: Color normal, Warm, Dry.   ED Course  Procedures   2330:  Neuro MD at bedside since pt arrival: he has evaluated pt, states pt's NIH score is 4, will not TPA due to unclear time of LKW as well as improving deficits. Admit to  medicine service.  0040:  T/C to [facility] Dr. Maudie Mercury, case discussed, including:  HPI, pertinent PM/SHx, VS/PE, dx testing, ED course and treatment:  Agreeable to admit, requests he will come to the ED for evaluation.      EKG Interpretation   Date/Time:  Sunday February 25 2014 23:23:37 EST Ventricular Rate:  109 PR Interval:  172 QRS Duration: 76 QT Interval:  306 QTC Calculation: 412 R Axis:   23 Text Interpretation:  Sinus tachycardia When compared with ECG of  03/17/2011 Rate faster Otherwise no significant change Confirmed by  [facility]  MD, Nunzio Cory [phone]) on 02/25/2014 11:27:53 PM      MDM  MDM Reviewed: previous chart, nursing note and vitals Reviewed previous: labs and ECG Interpretation: labs, ECG and CT scan   Results for orders placed or performed during the hospital encounter of 02/25/14  Ethanol  Result Value Ref Range   Alcohol, Ethyl (B) <11 0 - 11 mg/dL  Protime-INR  Result Value Ref Range   Prothrombin Time 13.4 11.6 - 15.2 seconds   INR 1.01 0.00 - 1.49  APTT  Result Value Ref Range   aPTT 26 24 - 37 seconds  CBC  Result Value Ref Range   WBC 9.9 4.0 - 10.5 K/uL   RBC 4.12 (L) 4.22 - 5.81 MIL/uL   Hemoglobin 13.2 13.0 - 17.0 g/dL   HCT 39.5 39.0 - 52.0 %   MCV 95.9 78.0 - 100.0 fL   MCH 32.0 26.0 - 34.0 pg   MCHC 33.4 30.0 - 36.0 g/dL   RDW 11.9 11.5 - 15.5 %   Platelets 246 150 - 400 K/uL  Differential  Result Value Ref Range   Neutrophils Relative % 74 43 - 77 %   Neutro Abs 7.4 1.7 - 7.7 K/uL   Lymphocytes Relative 16 12 - 46 %   Lymphs Abs 1.6 0.7 - 4.0 K/uL   Monocytes Relative 6 3 - 12 %   Monocytes Absolute 0.5 0.1 - 1.0 K/uL   Eosinophils Relative 3 0 - 5 %   Eosinophils Absolute 0.3 0.0 - 0.7 K/uL   Basophils Relative 1 0 - 1 %   Basophils Absolute 0.1 0.0 - 0.1 K/uL  Comprehensive metabolic panel  Result Value Ref Range   Sodium 140 137 - 147 mEq/L   Potassium 4.2 3.7 - 5.3 mEq/L   Chloride 101 96 - 112 mEq/L   CO2 24 19 - 32  mEq/L   Glucose, Bld 162 (H) 70 - 99 mg/dL   BUN 25 (H) 6 - 23 mg/dL   Creatinine, Ser 1.29 0.50 - 1.35 mg/dL   Calcium 9.9 8.4 - 10.5 mg/dL   Total Protein 7.0 6.0 - 8.3 g/dL   Albumin 3.5 3.5 - 5.2 g/dL   AST 18 0 - 37 U/L   ALT 16 0 - 53 U/L   Alkaline Phosphatase 101 39 - 117 U/L   Total Bilirubin 0.2 (L) 0.3 - 1.2 mg/dL   GFR calc non Af Amer 53 (L) >90 mL/min   GFR calc Af Amer 61 (L) >90 mL/min   Anion gap 15 5 - 15  I-Stat Chem 8, ED  Result Value Ref Range   Sodium 140 137 - 147 mEq/L   Potassium 4.0 3.7 - 5.3 mEq/L   Chloride 103 96 - 112 mEq/L   BUN 27 (H) 6 - 23 mg/dL   Creatinine, Ser 1.40 (H) 0.50 - 1.35 mg/dL   Glucose, Bld 163 (H) 70 - 99 mg/dL   Calcium, Ion 1.27 1.13 - 1.30 mmol/L   TCO2 25 0 - 100 mmol/L   Hemoglobin 13.6 13.0 - 17.0 g/dL   HCT 40.0 39.0 - 52.0 %  I-Stat Troponin, ED (not at [facility])  Result Value Ref Range   Troponin i, poc 0.01 0.00 - 0.08 ng/mL   Comment 3           Ct Head Wo Contrast 02/25/2014   CLINICAL DATA:  Code stroke. Trouble finding words at 1400 hr today.  EXAM: CT HEAD WITHOUT CONTRAST  TECHNIQUE: Contiguous axial images were obtained from the base of the skull through the vertex without intravenous contrast.  COMPARISON:  CT angio head 03/12/2011.  MRI brain 07/21/2012.  FINDINGS: Ventricles and sulci appear symmetrical. Probable old cerebellar infarcts. Patchy low-attenuation changes in the deep white matter consistent with small vessel ischemia. No mass effect or midline shift. No abnormal extra-axial fluid collections. Gray-white matter junctions are distinct. Basal cisterns are not effaced. No evidence of acute intracranial hemorrhage. No depressed skull fractures. Visualized paranasal sinuses and mastoid air cells are not opacified.  IMPRESSION: No acute intracranial abnormalities. Chronic small vessel ischemic changes and probable old cerebellar infarcts.  These results were called by telephone at the time of interpretation on  02/25/2014 at 11:29 pm to Dr. Jola Schmidt , who verbally acknowledged these results.   Electronically Signed   By: Lucienne Capers [name]   On: 02/25/2014 23:30         Francine Graven, DO 02/26/14 [id number]

## 2014-02-25 NOTE — Consult Note (Signed)
Referring Physician: ED    Chief Complaint: code stroke, aphasia  HPI:                                                                                                                                         Peter Nicholson is an 73 y.o. male, right handed, with a past medical history significant for HTN, DM, hyperlipidemia, stroke many years ago without residual deficits, seizures (last seizure 2012), right renal cell carcinoma s/p nephrectomy, brought in due to acute onset of the above stated symptoms. Patient stated that he was eating with his family around 2 pm today when he started having difficulty finding words out. However, his wife is at the bedside and contradicts his statement saying that he was doing well approximately 915 pm when the family noticed that he was having trouble speaking.' No reported HA, vertigo, double vision, difficulty swallowing, confusion, slurred speech, or vision impairment. Initial NIHSS 4. CT brain revealed no acute intracranial abnormality. Patient thinks his language is gradually improving.  Date last known well: 02/25/14 Time last known well: unclear. tPA Given: no, discordant information regarding time of onset. NIHSS: 4 (1 for language, 1 for vision, 2 for ataxia).   Past Medical History  Diagnosis Date  . Obstructive sleep apnea (adult) (pediatric)     doesnt wear CPAP  . HTN (hypertension)   . Cerebrovascular disease, unspecified   . Unspecified venous (peripheral) insufficiency   . Pure hypercholesterolemia   . Type II or unspecified type diabetes mellitus without mention of complication, not stated as uncontrolled   . Overweight(278.02)   . Diaphragmatic hernia without mention of obstruction or gangrene   . Esophageal reflux   . Diverticulosis of colon (without mention of hemorrhage)   . Other specified congenital anomaly of kidney   . Kidney carcinoma   . Calculus of kidney   . Gout, unspecified   . Lumbago   . Anxiety state, unspecified    . History of gallstones   . IBS (irritable bowel syndrome)   . History of seizures   . Bowel obstruction   . Osteoarthrosis, unspecified whether generalized or localized, unspecified site     tendonitis  . Blood transfusion without reported diagnosis   . Stroke     2001  . Seizures     last seizure November 2012    Past Surgical History  Procedure Laterality Date  . Incisional hernia repair  1/96    Dr.Leone  . Nasal sinus surgery  12/96    Dr.Redman  . Cholecystectomy  9/04    Dr. Rebekah Chesterfield  . 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 Carotid endarterectomy  09/2000    Dr.Lawson  . Veterbral art angioplasty      x2  . Small intestine surgery    . External fixation leg  03/10/2011  Procedure: EXTERNAL FIXATION LEG;  Surgeon: Rozanna Box;  Location: [place];  Service: Orthopedics;  Laterality: Right;  . Orif tibia plateau  03/24/2011    Procedure: OPEN REDUCTION INTERNAL FIXATION (ORIF) TIBIAL PLATEAU;  Surgeon: Rozanna Box;  Location: [place];  Service: Orthopedics;  Laterality: Right;    Family History  Problem Relation Age of Onset  . Heart attack Father   . Dementia Mother   . Ovarian cancer Paternal Grandmother   . Breast cancer Sister   . Colon cancer Neg Hx   . Esophageal cancer Neg Hx   . Rectal cancer Neg Hx   . Stomach cancer Neg Hx    Social History:  reports that he has never smoked. He has never used smokeless tobacco. He reports that he drinks alcohol. He reports that he does not use illicit drugs.  Allergies:  Allergies  Allergen Reactions  . Dilaudid [Hydromorphone Hcl]   . Promethazine Hcl     REACTION: hallucinations    Medications:                                                                                                                           I have reviewed the patient's current medications.  ROS:                                                                                                                                        History obtained from the patient and family  General ROS: negative for - chills, fatigue, fever, night sweats, weight gain or weight loss Psychological ROS: negative for - behavioral disorder, hallucinations, memory difficulties, mood swings or suicidal ideation Ophthalmic ROS: negative for - blurry vision, double vision, eye pain or loss of vision ENT ROS: negative for - epistaxis, nasal discharge, oral lesions, sore throat, tinnitus or vertigo Allergy and Immunology ROS: negative for - hives or itchy/watery eyes Hematological and Lymphatic ROS: negative for - bleeding problems, bruising or swollen lymph nodes Endocrine ROS: negative for - galactorrhea, hair pattern changes, polydipsia/polyuria or temperature intolerance Respiratory ROS: negative for - cough, hemoptysis, shortness of breath or wheezing Cardiovascular ROS: negative for - chest pain, dyspnea on exertion, edema or irregular heartbeat Gastrointestinal ROS: negative for - abdominal pain, diarrhea, hematemesis, nausea/vomiting or stool incontinence Genito-Urinary ROS: negative for - dysuria, hematuria, incontinence or urinary frequency/urgency Musculoskeletal ROS: negative for -  joint swelling or muscular weakness Neurological ROS: as noted in HPI Dermatological ROS: negative for rash and skin lesion changes  Physical exam: pleasant male in no apparent distress. Blood pressure 153/87, pulse 113, temperature 98.7 F (37.1 C), temperature source Oral, resp. rate 21, height _0  (1.676 m), weight 86.183 kg (190 lb), SpO2 98 %. Head: normocephalic. Neck: supple, no bruits, no JVD. Cardiac: no murmurs. Lungs: clear. Abdomen: soft, no tender, no mass. Extremities: no edema. Neurologic Examination:                                                                                                      General: Mental Status: Alert, oriented, thought content appropriate.  Mild motor aphasia without evidence  of aphasia.  Able to follow 3 step commands without difficulty. Cranial Nerves: II: Discs flat bilaterally; Visual fields partially impaired in the left quadrant, pupils equal, round, reactive to light and accommodation III,IV, VI: ptosis not present, extra-ocular motions intact bilaterally V,VII: smile symmetric, facial light touch sensation normal bilaterally VIII: hearing normal bilaterally IX,X: gag reflex present XI: bilateral shoulder shrug XII: midline tongue extension without atrophy or fasciculations  Motor: Right : Upper extremity   5/5    Left:     Upper extremity   5/5  Lower extremity   5/5     Lower extremity   5/5 Tone and bulk:normal tone throughout; no atrophy noted Sensory: Pinprick and light touch intact throughout, bilaterally Deep Tendon Reflexes:  Right: Upper Extremity   Left: Upper extremity   biceps (C-5 to C-6) 2/4   biceps (C-5 to C-6) 2/4 tricep (C7) 2/4    triceps (C7) 2/4 Brachioradialis (C6) 2/4  Brachioradialis (C6) 2/4  Lower Extremity Lower Extremity  quadriceps (L-2 to L-4) 2/4   quadriceps (L-2 to L-4) 2/4 Achilles (S1) 2/4   Achilles (S1) 2/4  Plantars: Right: downgoing   Left: downgoing Cerebellar: Ataxic in the right arm. Gait:  No tested    Results for orders placed or performed during the hospital encounter of 02/25/14 (from the past 48 hour(s))  Ethanol     Status: None   Collection Time: 02/25/14 11:10 PM  Result Value Ref Range   Alcohol, Ethyl (B) <11 0 - 11 mg/dL    Comment:        LOWEST DETECTABLE LIMIT FOR SERUM ALCOHOL IS 11 mg/dL FOR MEDICAL PURPOSES ONLY   Protime-INR     Status: None   Collection Time: 02/25/14 11:10 PM  Result Value Ref Range   Prothrombin Time 13.4 11.6 - 15.2 seconds   INR 1.01 0.00 - 1.49  APTT     Status: None   Collection Time: 02/25/14 11:10 PM  Result Value Ref Range   aPTT 26 24 - 37 seconds  CBC     Status: Abnormal   Collection Time: 02/25/14 11:10 PM  Result Value Ref Range   WBC  9.9 4.0 - 10.5 K/uL   RBC 4.12 (L) 4.22 - 5.81 MIL/uL   Hemoglobin 13.2 13.0 - 17.0 g/dL   HCT 39.5 39.0 - 52.0 %  MCV 95.9 78.0 - 100.0 fL   MCH 32.0 26.0 - 34.0 pg   MCHC 33.4 30.0 - 36.0 g/dL   RDW 11.9 11.5 - 15.5 %   Platelets 246 150 - 400 K/uL  Differential     Status: None   Collection Time: 02/25/14 11:10 PM  Result Value Ref Range   Neutrophils Relative % 74 43 - 77 %   Neutro Abs 7.4 1.7 - 7.7 K/uL   Lymphocytes Relative 16 12 - 46 %   Lymphs Abs 1.6 0.7 - 4.0 K/uL   Monocytes Relative 6 3 - 12 %   Monocytes Absolute 0.5 0.1 - 1.0 K/uL   Eosinophils Relative 3 0 - 5 %   Eosinophils Absolute 0.3 0.0 - 0.7 K/uL   Basophils Relative 1 0 - 1 %   Basophils Absolute 0.1 0.0 - 0.1 K/uL  Comprehensive metabolic panel     Status: Abnormal   Collection Time: 02/25/14 11:10 PM  Result Value Ref Range   Sodium 140 137 - 147 mEq/L   Potassium 4.2 3.7 - 5.3 mEq/L   Chloride 101 96 - 112 mEq/L   CO2 24 19 - 32 mEq/L   Glucose, Bld 162 (H) 70 - 99 mg/dL   BUN 25 (H) 6 - 23 mg/dL   Creatinine, Ser 1.29 0.50 - 1.35 mg/dL   Calcium 9.9 8.4 - 10.5 mg/dL   Total Protein 7.0 6.0 - 8.3 g/dL   Albumin 3.5 3.5 - 5.2 g/dL   AST 18 0 - 37 U/L   ALT 16 0 - 53 U/L   Alkaline Phosphatase 101 39 - 117 U/L   Total Bilirubin 0.2 (L) 0.3 - 1.2 mg/dL   GFR calc non Af Amer 53 (L) >90 mL/min   GFR calc Af Amer 61 (L) >90 mL/min    Comment: (NOTE) The eGFR has been calculated using the CKD EPI equation. This calculation has not been validated in all clinical situations. eGFR's persistently <90 mL/min signify possible Chronic Kidney Disease.    Anion gap 15 5 - 15  I-Stat Troponin, ED (not at [facility])     Status: None   Collection Time: 02/25/14 11:18 PM  Result Value Ref Range   Troponin i, poc 0.01 0.00 - 0.08 ng/mL   Comment 3            Comment: Due to the release kinetics of cTnI, a negative result within the first hours of the onset of symptoms does not rule out myocardial infarction  with certainty. If myocardial infarction is still suspected, repeat the test at appropriate intervals.   I-Stat Chem 8, ED     Status: Abnormal   Collection Time: 02/25/14 11:19 PM  Result Value Ref Range   Sodium 140 137 - 147 mEq/L   Potassium 4.0 3.7 - 5.3 mEq/L   Chloride 103 96 - 112 mEq/L   BUN 27 (H) 6 - 23 mg/dL   Creatinine, Ser 1.40 (H) 0.50 - 1.35 mg/dL   Glucose, Bld 163 (H) 70 - 99 mg/dL   Calcium, Ion 1.27 1.13 - 1.30 mmol/L   TCO2 25 0 - 100 mmol/L   Hemoglobin 13.6 13.0 - 17.0 g/dL   HCT 40.0 39.0 - 52.0 %   Ct Head Wo Contrast  02/25/2014   CLINICAL DATA:  Code stroke. Trouble finding words at 1400 hr today.  EXAM: CT HEAD WITHOUT CONTRAST  TECHNIQUE: Contiguous axial images were obtained from the base of the skull through the vertex  without intravenous contrast.  COMPARISON:  CT angio head 03/12/2011.  MRI brain 07/21/2012.  FINDINGS: Ventricles and sulci appear symmetrical. Probable old cerebellar infarcts. Patchy low-attenuation changes in the deep white matter consistent with small vessel ischemia. No mass effect or midline shift. No abnormal extra-axial fluid collections. Gray-white matter junctions are distinct. Basal cisterns are not effaced. No evidence of acute intracranial hemorrhage. No depressed skull fractures. Visualized paranasal sinuses and mastoid air cells are not opacified.  IMPRESSION: No acute intracranial abnormalities. Chronic small vessel ischemic changes and probable old cerebellar infarcts.  These results were called by telephone at the time of interpretation on 02/25/2014 at 11:29 pm to Dr. Jola Schmidt , who verbally acknowledged these results.   Electronically Signed   By: Lucienne Capers [name]   On: 02/25/2014 23:30    Assessment: 73 y.o. male with acute language impairment. NIHSS 4 (1 for language, 1 for vision, 2 for ataxia) and CT brain without acute abnormality. Discordant information regarding last seen normal, language gradually improving,  and therefore decided against administering iv thrombolysis. Suspect acute left cortical infarct involving branch left MCA. Admit to medicine and complete stroke up. Aspirin after passing swallowing evaluation. Stroke team will follow up tomorrow.  Stroke Risk Factors -  Age, HTN, DM, hyperlipidemia, prior stroke  Plan: 1. HgbA1c, fasting lipid panel 2. MRI, MRA  of the brain without contrast 3. Echocardiogram 4. Carotid dopplers 5. Prophylactic therapy-aspirin after passing swallowing evaluation 6. Risk factor modification 7. Telemetry monitoring 8. Frequent neuro checks 9. PT/OT SLP   Dorian Pod, MD [facility] [phone]  02/25/2014, 11:52 PM

## 2014-02-26 ENCOUNTER — Encounter (HOSPITAL_COMMUNITY): Payer: Self-pay | Admitting: Internal Medicine

## 2014-02-26 ENCOUNTER — Inpatient Hospital Stay (HOSPITAL_COMMUNITY): Payer: Medicare Other

## 2014-02-26 DIAGNOSIS — E785 Hyperlipidemia, unspecified: Secondary | ICD-10-CM | POA: Diagnosis not present

## 2014-02-26 DIAGNOSIS — Z7902 Long term (current) use of antithrombotics/antiplatelets: Secondary | ICD-10-CM | POA: Diagnosis not present

## 2014-02-26 DIAGNOSIS — N289 Disorder of kidney and ureter, unspecified: Secondary | ICD-10-CM | POA: Diagnosis not present

## 2014-02-26 DIAGNOSIS — Z888 Allergy status to other drugs, medicaments and biological substances status: Secondary | ICD-10-CM | POA: Diagnosis not present

## 2014-02-26 DIAGNOSIS — I63132 Cerebral infarction due to embolism of left carotid artery: Secondary | ICD-10-CM | POA: Diagnosis present

## 2014-02-26 DIAGNOSIS — Z85528 Personal history of other malignant neoplasm of kidney: Secondary | ICD-10-CM | POA: Diagnosis not present

## 2014-02-26 DIAGNOSIS — F411 Generalized anxiety disorder: Secondary | ICD-10-CM | POA: Diagnosis not present

## 2014-02-26 DIAGNOSIS — E669 Obesity, unspecified: Secondary | ICD-10-CM | POA: Diagnosis not present

## 2014-02-26 DIAGNOSIS — R471 Dysarthria and anarthria: Secondary | ICD-10-CM

## 2014-02-26 DIAGNOSIS — M109 Gout, unspecified: Secondary | ICD-10-CM | POA: Diagnosis not present

## 2014-02-26 DIAGNOSIS — E119 Type 2 diabetes mellitus without complications: Secondary | ICD-10-CM | POA: Diagnosis not present

## 2014-02-26 DIAGNOSIS — Z8249 Family history of ischemic heart disease and other diseases of the circulatory system: Secondary | ICD-10-CM | POA: Diagnosis not present

## 2014-02-26 DIAGNOSIS — G4733 Obstructive sleep apnea (adult) (pediatric): Secondary | ICD-10-CM | POA: Diagnosis not present

## 2014-02-26 DIAGNOSIS — R000 Tachycardia, unspecified: Secondary | ICD-10-CM

## 2014-02-26 DIAGNOSIS — I6300 Cerebral infarction due to thrombosis of unspecified precerebral artery: Secondary | ICD-10-CM

## 2014-02-26 DIAGNOSIS — Z885 Allergy status to narcotic agent status: Secondary | ICD-10-CM | POA: Diagnosis not present

## 2014-02-26 DIAGNOSIS — I10 Essential (primary) hypertension: Secondary | ICD-10-CM | POA: Diagnosis not present

## 2014-02-26 DIAGNOSIS — Z7982 Long term (current) use of aspirin: Secondary | ICD-10-CM | POA: Diagnosis not present

## 2014-02-26 DIAGNOSIS — Z8673 Personal history of transient ischemic attack (TIA), and cerebral infarction without residual deficits: Secondary | ICD-10-CM | POA: Diagnosis not present

## 2014-02-26 DIAGNOSIS — E780 Pure hypercholesterolemia: Secondary | ICD-10-CM | POA: Diagnosis not present

## 2014-02-26 DIAGNOSIS — M1990 Unspecified osteoarthritis, unspecified site: Secondary | ICD-10-CM | POA: Diagnosis not present

## 2014-02-26 DIAGNOSIS — M545 Low back pain: Secondary | ICD-10-CM | POA: Diagnosis not present

## 2014-02-26 DIAGNOSIS — Z794 Long term (current) use of insulin: Secondary | ICD-10-CM | POA: Diagnosis not present

## 2014-02-26 DIAGNOSIS — I359 Nonrheumatic aortic valve disorder, unspecified: Secondary | ICD-10-CM

## 2014-02-26 DIAGNOSIS — Z6830 Body mass index (BMI) 30.0-30.9, adult: Secondary | ICD-10-CM | POA: Diagnosis not present

## 2014-02-26 LAB — GLUCOSE, CAPILLARY
GLUCOSE-CAPILLARY: 157 mg/dL — AB (ref 70–99)
Glucose-Capillary: 125 mg/dL — ABNORMAL HIGH (ref 70–99)
Glucose-Capillary: 173 mg/dL — ABNORMAL HIGH (ref 70–99)
Glucose-Capillary: 99 mg/dL (ref 70–99)
Glucose-Capillary: 166 mg/dL — ABNORMAL HIGH (ref 70–99)

## 2014-02-26 LAB — URINE MICROSCOPIC-ADD ON

## 2014-02-26 LAB — HEMOGLOBIN A1C
Hgb A1c MFr Bld: 9 % — ABNORMAL HIGH (ref <5.7)
Mean Plasma Glucose: 212 mg/dL — ABNORMAL HIGH (ref <117)

## 2014-02-26 LAB — URINALYSIS, ROUTINE W REFLEX MICROSCOPIC
BILIRUBIN URINE: NEGATIVE
Glucose, UA: 250 mg/dL — AB
HGB URINE DIPSTICK: NEGATIVE
KETONES UR: NEGATIVE mg/dL
Nitrite: NEGATIVE
PH: 5.5 (ref 5.0–8.0)
Protein, ur: 30 mg/dL — AB
Specific Gravity, Urine: 1.016 (ref 1.005–1.030)
Urobilinogen, UA: 0.2 mg/dL (ref 0.0–1.0)

## 2014-02-26 LAB — LIPID PANEL
Cholesterol: 110 mg/dL (ref 0–200)
HDL: 41 mg/dL (ref >39)
LDL CALC: 52 mg/dL (ref 0–99)
Total CHOL/HDL Ratio: 2.7 RATIO
Triglycerides: 87 mg/dL (ref <150)
VLDL: 17 mg/dL (ref 0–40)

## 2014-02-26 LAB — TSH: TSH: 1.4 u[IU]/mL (ref 0.350–4.500)

## 2014-02-26 LAB — TROPONIN I: Troponin I: <0.3 ng/mL (ref <0.30)

## 2014-02-26 LAB — RAPID URINE DRUG SCREEN, HOSP PERFORMED
Amphetamines: NOT DETECTED
BARBITURATES: NOT DETECTED
Benzodiazepines: POSITIVE — AB
COCAINE: NOT DETECTED
OPIATES: NOT DETECTED
Tetrahydrocannabinol: NOT DETECTED

## 2014-02-26 MED ORDER — ASPIRIN EC 81 MG PO TBEC
81.0000 mg | DELAYED_RELEASE_TABLET | Freq: Every day | ORAL | Status: DC
  Administered 2014-02-26 – 2014-02-28 (×3): 81 mg via ORAL
  Filled 2014-02-26 (×3): qty 1

## 2014-02-26 MED ORDER — TRAMADOL HCL 50 MG PO TABS: 50.0000 mg | ORAL_TABLET | Freq: Four times a day (QID) | ORAL | Status: DC | PRN

## 2014-02-26 MED ORDER — INSULIN GLARGINE 100 UNIT/ML ~~LOC~~ SOLN
10.0000 [IU] | Freq: Every day | SUBCUTANEOUS | Status: DC
  Administered 2014-02-26 – 2014-02-27 (×3): 10 [IU] via SUBCUTANEOUS
  Filled 2014-02-26 (×4): qty 0.1

## 2014-02-26 MED ORDER — INSULIN ASPART 100 UNIT/ML ~~LOC~~ SOLN: 0.0000 [IU] | Freq: Every day | SUBCUTANEOUS | Status: DC

## 2014-02-26 MED ORDER — CLOPIDOGREL BISULFATE 75 MG PO TABS
75.0000 mg | ORAL_TABLET | Freq: Every day | ORAL | Status: DC
  Administered 2014-02-26 – 2014-02-28 (×3): 75 mg via ORAL
  Filled 2014-02-26 (×3): qty 1

## 2014-02-26 MED ORDER — INSULIN ASPART 100 UNIT/ML ~~LOC~~ SOLN
0.0000 [IU] | Freq: Three times a day (TID) | SUBCUTANEOUS | Status: DC
  Administered 2014-02-26 (×2): 2 [IU] via SUBCUTANEOUS
  Administered 2014-02-28: 3 [IU] via SUBCUTANEOUS

## 2014-02-26 MED ORDER — PANTOPRAZOLE SODIUM 40 MG PO TBEC
40.0000 mg | DELAYED_RELEASE_TABLET | Freq: Every day | ORAL | Status: DC
  Administered 2014-02-26 – 2014-02-28 (×3): 40 mg via ORAL
  Filled 2014-02-26 (×3): qty 1

## 2014-02-26 MED ORDER — IOHEXOL 350 MG/ML SOLN
50.0000 mL | Freq: Once | INTRAVENOUS | Status: AC | PRN
  Administered 2014-02-26: 50 mL via INTRAVENOUS

## 2014-02-26 MED ORDER — INFLUENZA VAC SPLIT QUAD 0.5 ML IM SUSY
0.5000 mL | PREFILLED_SYRINGE | INTRAMUSCULAR | Status: AC
  Administered 2014-02-27: 0.5 mL via INTRAMUSCULAR
  Filled 2014-02-26: qty 0.5

## 2014-02-26 MED ORDER — LINAGLIPTIN 5 MG PO TABS
5.0000 mg | ORAL_TABLET | Freq: Every day | ORAL | Status: DC
  Administered 2014-02-26 – 2014-02-28 (×3): 5 mg via ORAL
  Filled 2014-02-26 (×3): qty 1

## 2014-02-26 MED ORDER — SODIUM CHLORIDE 0.9 % IV SOLN
INTRAVENOUS | Status: AC
  Administered 2014-02-26: 03:00:00 via INTRAVENOUS

## 2014-02-26 MED ORDER — ALLOPURINOL 100 MG PO TABS
300.0000 mg | ORAL_TABLET | Freq: Every day | ORAL | Status: DC
  Administered 2014-02-26 – 2014-02-28 (×3): 300 mg via ORAL
  Filled 2014-02-26 (×4): qty 3

## 2014-02-26 MED ORDER — SIMVASTATIN 40 MG PO TABS
40.0000 mg | ORAL_TABLET | Freq: Every day | ORAL | Status: DC
  Administered 2014-02-26 – 2014-02-28 (×3): 40 mg via ORAL
  Filled 2014-02-26 (×3): qty 1

## 2014-02-26 MED ORDER — STROKE: EARLY STAGES OF RECOVERY BOOK
Freq: Once | Status: AC
  Administered 2014-02-26: 03:00:00
  Filled 2014-02-26: qty 1

## 2014-02-26 MED ORDER — ALPRAZOLAM 0.5 MG PO TABS
0.5000 mg | ORAL_TABLET | Freq: Three times a day (TID) | ORAL | Status: DC | PRN
  Administered 2014-02-26: 0.5 mg via ORAL
  Filled 2014-02-26: qty 1

## 2014-02-26 MED ORDER — VITAMIN D 1000 UNITS PO TABS
1000.0000 [IU] | ORAL_TABLET | Freq: Every day | ORAL | Status: DC
  Administered 2014-02-26 – 2014-02-28 (×3): 1000 [IU] via ORAL
  Filled 2014-02-26 (×3): qty 1

## 2014-02-26 MED ORDER — SODIUM CHLORIDE 0.9 % IJ SOLN
3.0000 mL | Freq: Two times a day (BID) | INTRAMUSCULAR | Status: DC
  Administered 2014-02-26 (×3): 3 mL via INTRAVENOUS

## 2014-02-26 NOTE — Progress Notes (Signed)
Patient admitted after midnight by Dr. Maudie Mercury- please see H&p.  Patient still having some word finding difficulties  Dysarthria r/o TIA/CVA MRI/MRA brain: Acute small nonhemorrhagic infarcts spread throughout the left frontal lobe (parasagittal distribution), left temporal lobe and at the left temporal-parietal lobe junction (periatrial region).  Remote bilateral cerebellar infarcts more notable on the right.  Remote tiny right thalamic infarcts. Mild small vessel disease type changes. Check carotid ultrasound, check cardiac echo Pt is already on aspirin: switch to plavix 75mg  po qday.  Appreciate neurology input  Dm2: fsbs ac and qhs Pt appears to have passed nursing bedside swallow,  Diabetic diet  Tachycardia Check tsh, check trop q6h x3- negative Check echocardiogram  Renal insufficiency- baseline 1.2 Hydrate gently with normal saline.   Hypertension: allow permissive hypertension at this time Continue with current bp medications.   Eulogio Bear  DO

## 2014-02-26 NOTE — Evaluation (Signed)
Speech Language Pathology Evaluation Patient Details Name: Peter Nicholson MRN: [id number] DOB: 16-Mar-1941 Today's Date: 02/26/2014 Time: 2595-6387 SLP Time Calculation (min) (ACUTE ONLY): 23 min  Problem List:  Patient Active Problem List   Diagnosis Date Noted  . Tachycardia 02/26/2014  . Dysarthria 02/26/2014  . CVA (cerebral infarction)   . Dysesthesia 06/10/2012  . Abdominal bruit 06/10/2012  . Other specified transient cerebral ischemias 06/10/2012  . Occlusion and stenosis of carotid artery without mention of cerebral infarction 06/10/2012  . Sudden visual loss 06/10/2012  . Memory impairment 04/25/2012  . Dysphagia 01/18/2012  . Renal cell cancer 01/18/2012  . Acute UTI 04/28/2011  . Renal insufficiency 04/28/2011  . Dysuria 04/27/2011  . Acute blood loss anemia 03/27/2011  . TIA (transient ischemic attack) 03/16/2011  . Closed fracture of tibial plateau 03/11/2011  . VITAMIN B12 DEFICIENCY 07/25/2008  . OVERWEIGHT 06/06/2008  . ESOPHAGEAL STRICTURE 03/14/2008  . CONSTIPATION 03/14/2008  . OBSTRUCTIVE SLEEP APNEA 11/28/2007  . CEREBROVASCULAR DISEASE 11/28/2007  . VENOUS INSUFFICIENCY 11/28/2007  . DIVERTICULOSIS OF COLON 11/28/2007  . NEPHROLITHIASIS 11/28/2007  . HORSESHOE KIDNEY 11/28/2007  . Diabetes 03/22/2007  . HYPERCHOLESTEROLEMIA 03/22/2007  . GOUT 03/22/2007  . ANXIETY 03/22/2007  . HYPERTENSION 03/22/2007  . GERD 03/22/2007  . HIATAL HERNIA 03/22/2007  . DEGENERATIVE JOINT DISEASE 03/22/2007  . BACK PAIN, LUMBAR 03/22/2007   Past Medical History:  Past Medical History  Diagnosis Date  . Obstructive sleep apnea (adult) (pediatric)     doesnt wear CPAP  . HTN (hypertension)   . Cerebrovascular disease, unspecified   . Unspecified venous (peripheral) insufficiency   . Pure hypercholesterolemia   . Type II or unspecified type diabetes mellitus without mention of complication, not stated as uncontrolled   . Overweight(278.02)   . Diaphragmatic  hernia without mention of obstruction or gangrene   . Esophageal reflux   . Diverticulosis of colon (without mention of hemorrhage)   . Other specified congenital anomaly of kidney   . Kidney carcinoma   . Calculus of kidney   . Gout, unspecified   . Lumbago   . Anxiety state, unspecified   . History of gallstones   . IBS (irritable bowel syndrome)   . History of seizures   . Bowel obstruction   . Osteoarthrosis, unspecified whether generalized or localized, unspecified site     tendonitis  . Blood transfusion without reported diagnosis   . Stroke     2001  . Seizures     last seizure November 2012   Past Surgical History:  Past Surgical History  Procedure Laterality Date  . Incisional hernia repair  1/96    Dr.Leone  . Nasal sinus surgery  12/96    Dr.Redman  . Cholecystectomy  9/04    Dr. Rebekah Chesterfield  . 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 Carotid endarterectomy  09/2000    Dr.Lawson  . Veterbral art angioplasty      x2  . Small intestine surgery    . External fixation leg  03/10/2011    Procedure: EXTERNAL FIXATION LEG;  Surgeon: Rozanna Box;  Location: [place];  Service: Orthopedics;  Laterality: Right;  . Orif tibia plateau  03/24/2011    Procedure: OPEN REDUCTION INTERNAL FIXATION (ORIF) TIBIAL PLATEAU;  Surgeon: Rozanna Box;  Location: [place];  Service: Orthopedics;  Laterality: Right;   HPI:  73 yo male with hx of HTN, DM2, seizures, CVA admitted with c/o dysarthria  and difficulty expressing himself. CT scan was negative for any acute process. MRI revealed acute small nonhemorrhagic infarcts spread throughout the left frontal lobe (parasagittal distribution), left temporal lobe and at the left temporal-parietal lobe junction. Remote bilateral cerebellar infarcts more notable on the right. Remote tiny right thalamic infarcts   Assessment / Plan / Recommendation Clinical Impression  Pt. exhibits mild-moderate cognitive impairments  and mod-severe Wernicke's type aphasia evidenced by difficulty with phrase repetition, impairments in comprehension, dysnomia, decreased semantics, mixture of neologims/phonemic paraphasias and accuracte utterances.  He demonstrated little awareness of language errors.  Decreased problem solving and suspected memory characterize cognitive deficits.  Pt. would benefit from continued ST to facilitate linguistic and cognitive abilities.    SLP Assessment  Patient needs continued Speech Lanaguage Pathology Services    Follow Up Recommendations  Outpatient SLP    Frequency and Duration min 3x week  2 weeks   Pertinent Vitals/Pain Pain Assessment: No/denies pain   SLP Goals  Potential to Achieve Goals (ACUTE ONLY): Good  SLP Evaluation Prior Functioning  Cognitive/Linguistic Baseline: Information not available   Cognition  Overall Cognitive Status: Impaired/Different from baseline Arousal/Alertness: Awake/alert Orientation Level: Oriented to person;Disoriented to time;Oriented to place;Disoriented to situation Attention: Sustained Sustained Attention: Appears intact Memory: Impaired (will continue to assess in therapy) Awareness: Impaired Awareness Impairment: Intellectual impairment;Emergent impairment;Anticipatory impairment Problem Solving: Impaired Problem Solving Impairment: Functional basic Safety/Judgment: Impaired    Comprehension  Auditory Comprehension Overall Auditory Comprehension: Impaired Yes/No Questions: Impaired Basic Biographical Questions: 0-25% accurate Basic Immediate Environment Questions: 0-24% accurate Commands: Impaired One Step Basic Commands: 25-49% accurate Conversation: Simple Visual Recognition/Discrimination Discrimination: Not tested Reading Comprehension Reading Status: Impaired Word level: Impaired Sentence Level: Impaired    Expression Expression Primary Mode of Expression: Verbal Verbal Expression Overall Verbal Expression:  Impaired Initiation: No impairment Automatic Speech:  (imprecisions with singing and counting) Level of Generative/Spontaneous Verbalization: Conversation Repetition: Impaired Level of Impairment: Phrase level;Sentence level;Word level (word w/ 30% accuracy) Naming: Impairment Confrontation: Impaired (50%) Verbal Errors: Neologisms;Phonemic paraphasias;Perseveration;Not aware of errors Pragmatics: No impairment Written Expression Dominant Hand: Right Written Expression: Exceptions to [facility] Self Formulation Ability: Phrase   Oral / Motor Oral Motor/Sensory Function Overall Oral Motor/Sensory Function:  (to be assessed) Motor Speech Overall Motor Speech: Appears within functional limits for tasks assessed Respiration: Within functional limits Phonation: Normal Resonance: Within functional limits Articulation: Within functional limitis Intelligibility: Intelligible Motor Planning: Witnin functional limits   GO     Houston Siren 02/26/2014, 3:36 PM  [name] Nakeysha Pasqual M.Ed [employer] [phone]

## 2014-02-26 NOTE — Progress Notes (Signed)
CARE MANAGEMENT NOTE 02/26/2014  Patient:  Peter Nicholson, Peter Nicholson   Account Number:  [id number]  Date Initiated:  02/26/2014  Documentation initiated by:  Olga Coaster  Subjective/Objective Assessment:   ADMITTED WITH STROKE     Action/Plan:   CM FOLLOWING FOR DCP   Anticipated DC Date:  03/01/2014   Anticipated DC Plan:  AWAITING FOR PT/OT EVALS FOR DISPOSITION NEEDS     DC Planning Services  CM consult        Status of service:  In process, will continue to follow Medicare Important Message given?   (If response is "NO", the following Medicare IM given date fields will be blank)  Per UR Regulation:  Reviewed for med. necessity/level of care/duration of stay  Comments:  11/16/2015Mindi Slicker RN,BSN,MHA [phone]

## 2014-02-26 NOTE — Progress Notes (Signed)
STROKE TEAM PROGRESS NOTE   HISTORY Peter Nicholson is an 73 y.o. male, right handed, with a past medical history significant for HTN, DM, hyperlipidemia, stroke many years ago without residual deficits, seizures (last seizure 2012), right renal cell carcinoma s/p nephrectomy, brought in due to acute onset of the aphasia. Patient stated that he was eating with his family around 2 pm today when he started having difficulty finding words out. However, his wife is at the bedside and contradicts his statement saying that he was doing well approximately 915 pm when the family noticed that he was having trouble speaking.' No reported HA, vertigo, double vision, difficulty swallowing, confusion, slurred speech, or vision impairment. Initial NIHSS 4. CT brain revealed no acute intracranial abnormality. Patient thinks his language is gradually improving.  Date last known well: 02/25/14 Time last known well: unclear. tPA Given: no, discordant information regarding time of onset. NIHSS: 4 (1 for language, 1 for vision, 2 for ataxia).   Patient was not administered TPA secondary to late arrival  He was admitted to the neuro floor bed for further evaluation and treatment.   SUBJECTIVE (INTERVAL HISTORY) His RN is at the bedside. He has remote h/o left CEA and is unaware of any restenosis. Overall he feels his condition is gradually improving.     OBJECTIVE Temp:  [97.7 F (36.5 C)-98.7 F (37.1 C)] 97.7 F (36.5 C) (11/16 0951) Pulse Rate:  [93-128] 93 (11/16 0951) Cardiac Rhythm:  [-] Sinus tachycardia (11/16 0150) Resp:  [18-21] 18 (11/16 0951) BP: (132-174)/(65-101) 147/93 mmHg (11/16 0951) SpO2:  [95 %-98 %] 96 % (11/16 0951) Weight:  [187 lb 3.2 oz (84.913 kg)-190 lb (86.183 kg)] 187 lb 3.2 oz (84.913 kg) (11/16 0142)   Recent Labs Lab 02/26/14 0215 02/26/14 0620 02/26/14 1132  GLUCAP 125* 157* 173*    Recent Labs Lab 02/25/14 2310 02/25/14 2319  NA 140 140  K 4.2 4.0  CL 101  103  CO2 24  --   GLUCOSE 162* 163*  BUN 25* 27*  CREATININE 1.29 1.40*  CALCIUM 9.9  --     Recent Labs Lab 02/25/14 2310  AST 18  ALT 16  ALKPHOS 101  BILITOT 0.2*  PROT 7.0  ALBUMIN 3.5    Recent Labs Lab 02/25/14 2310 02/25/14 2319  WBC 9.9  --   NEUTROABS 7.4  --   HGB 13.2 13.6  HCT 39.5 40.0  MCV 95.9  --   PLT 246  --     Recent Labs Lab 02/26/14 0416 02/26/14 0525 02/26/14 1221  TROPONINI <0.30 <0.30 <0.30    Recent Labs  02/25/14 2310  LABPROT 13.4  INR 1.01    Recent Labs  02/26/14 0703  COLORURINE YELLOW  LABSPEC 1.016  PHURINE 5.5  GLUCOSEU 250*  HGBUR NEGATIVE  BILIRUBINUR NEGATIVE  KETONESUR NEGATIVE  PROTEINUR 30*  UROBILINOGEN 0.2  NITRITE NEGATIVE  LEUKOCYTESUR SMALL*       Component Value Date/Time   CHOL 110 02/26/2014 0416   TRIG 87 02/26/2014 0416   HDL 41 02/26/2014 0416   CHOLHDL 2.7 02/26/2014 0416   VLDL 17 02/26/2014 0416   LDLCALC 52 02/26/2014 0416   Lab Results  Component Value Date   HGBA1C 9.0* 02/26/2014      Component Value Date/Time   LABOPIA NONE DETECTED 02/26/2014 0703   COCAINSCRNUR NONE DETECTED 02/26/2014 0703   LABBENZ POSITIVE* 02/26/2014 0703   AMPHETMU NONE DETECTED 02/26/2014 0703   THCU NONE DETECTED 02/26/2014 0703  LABBARB NONE DETECTED 02/26/2014 0703     Recent Labs Lab 02/25/14 2310  ETH <11    Ct Head Wo Contrast  02/25/2014   CLINICAL DATA:  Code stroke. Trouble finding words at 1400 hr today.  EXAM: CT HEAD WITHOUT CONTRAST  TECHNIQUE: Contiguous axial images were obtained from the base of the skull through the vertex without intravenous contrast.  COMPARISON:  CT angio head 03/12/2011.  MRI brain 07/21/2012.  FINDINGS: Ventricles and sulci appear symmetrical. Probable old cerebellar infarcts. Patchy low-attenuation changes in the deep white matter consistent with small vessel ischemia. No mass effect or midline shift. No abnormal extra-axial fluid collections.  Gray-white matter junctions are distinct. Basal cisterns are not effaced. No evidence of acute intracranial hemorrhage. No depressed skull fractures. Visualized paranasal sinuses and mastoid air cells are not opacified.  IMPRESSION: No acute intracranial abnormalities. Chronic small vessel ischemic changes and probable old cerebellar infarcts.  These results were called by telephone at the time of interpretation on 02/25/2014 at 11:29 pm to Dr. Jola Schmidt , who verbally acknowledged these results.   Electronically Signed   By: Lucienne Capers [name]   On: 02/25/2014 23:30   [name] Wo Contrast  02/26/2014   ADDENDUM REPORT: 02/26/2014 11:21  ADDENDUM: These results were called by telephone at the time of interpretation on 02/26/2014 at 11:00 am to Dr. Eliseo Squires , who verbally acknowledged these results.   Electronically Signed   By: Chauncey Cruel [name]   On: 02/26/2014 11:21   02/26/2014   CLINICAL DATA:  73 year old diabetic hypertensive male presenting with dysarthria. Subsequent encounter.  EXAM: MRI HEAD WITHOUT CONTRAST  MRA HEAD WITHOUT CONTRAST  TECHNIQUE: Multiplanar, multiecho pulse sequences of the brain and surrounding structures were obtained without intravenous contrast. Angiographic images of the head were obtained using MRA technique without contrast.  COMPARISON:  02/25/2014 CT.  07/21/2012 MR.  FINDINGS: MRI HEAD FINDINGS  Acute small nonhemorrhagic infarcts spread throughout the left frontal lobe (parasagittal distribution), left temporal lobe and at the left temporal-parietal lobe junction (periatrial region).  Remote bilateral cerebellar infarcts more notable on the right. Remote tiny right thalamic infarcts. Mild small vessel disease type changes.  No hydrocephalus.  No intracranial hemorrhage.  No intracranial mass lesion noted on this unenhanced exam.  Mild cervical spondylotic changes upper cervical spine.  Abnormal appearance of the left internal carotid artery and right vertebral  artery. Please see below.  MRA HEAD FINDINGS  Exam is motion degraded.  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 Reconstitution of flow left supraclinoid region appears inadequate as only a small amount of flow seen at the left carotid terminus and portions of the left middle cerebral artery. Findings new from the prior exam.  Moderate to marked stenosis right internal carotid artery cavernous segment.  A2 segment of the anterior cerebral arteries supplied bilaterally from the right.  Moderate narrowing proximal A1 segment left anterior cerebral artery.  Vessel at the anterior communicating artery region appears to be branch rather than aneurysm.  Occluded right vertebral artery and right posterior inferior cerebral artery.  High-grade multiple stenosis of the distal left vertebral artery.  Moderate narrowing proximal basilar artery.  Moderate stenosis portions of the P1 segment of the posterior cerebral artery more notable on the right. Nonvisualized distal branches of the right posterior cerebral artery.  IMPRESSION: MRI HEAD  Acute small nonhemorrhagic infarcts spread throughout the left frontal lobe (parasagittal distribution), left temporal lobe and at the left temporal-parietal lobe junction (periatrial region).  Remote bilateral cerebellar infarcts more notable on the right. Remote tiny right thalamic infarcts. Mild small vessel disease type changes.  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 Reconstitution of flow left supraclinoid region appears inadequate as only a small amount of flow seen at the left carotid terminus and portions of the left middle cerebral artery. Findings new from the prior exam.  Moderate to marked stenosis right internal carotid artery cavernous segment.  A2 segment of the anterior cerebral arteries supplied bilaterally from the right.  Moderate narrowing proximal A1 segment left anterior cerebral artery.  Occluded right vertebral artery and right posterior inferior  cerebral artery. This is noted previously.  High-grade multiple stenosis of the distal left vertebral artery. This is noted previously.  Moderate narrowing proximal basilar artery. This is noted previously.  Moderate stenosis portions of the P1 segment of the posterior cerebral artery more notable on the right. Nonvisualized distal branches of the right posterior cerebral artery.  These results will be called to the ordering clinician or representative by the Radiologist Assistant, and communication documented in the PACS or zVision Dashboard.  Electronically Signed: By: Chauncey Cruel [name] On: 02/26/2014 08:49   Mr Brain Wo Contrast  02/26/2014   ADDENDUM REPORT: 02/26/2014 11:21  ADDENDUM: These results were called by telephone at the time of interpretation on 02/26/2014 at 11:00 am to Dr. Eliseo Squires , who verbally acknowledged these results.   Electronically Signed   By: Chauncey Cruel [name]   On: 02/26/2014 11:21   02/26/2014   CLINICAL DATA:  73 year old diabetic hypertensive male presenting with dysarthria. Subsequent encounter.  EXAM: MRI HEAD WITHOUT CONTRAST  MRA HEAD WITHOUT CONTRAST  TECHNIQUE: Multiplanar, multiecho pulse sequences of the brain and surrounding structures were obtained without intravenous contrast. Angiographic images of the head were obtained using MRA technique without contrast.  COMPARISON:  02/25/2014 CT.  07/21/2012 MR.  FINDINGS: MRI HEAD FINDINGS  Acute small nonhemorrhagic infarcts spread throughout the left frontal lobe (parasagittal distribution), left temporal lobe and at the left temporal-parietal lobe junction (periatrial region).  Remote bilateral cerebellar infarcts more notable on the right. Remote tiny right thalamic infarcts. Mild small vessel disease type changes.  No hydrocephalus.  No intracranial hemorrhage.  No intracranial mass lesion noted on this unenhanced exam.  Mild cervical spondylotic changes upper cervical spine.  Abnormal appearance of the left internal carotid  artery and right vertebral artery. Please see below.  MRA HEAD FINDINGS  Exam is motion degraded.  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 Reconstitution of flow left supraclinoid region appears inadequate as only a small amount of flow seen at the left carotid terminus and portions of the left middle cerebral artery. Findings new from the prior exam.  Moderate to marked stenosis right internal carotid artery cavernous segment.  A2 segment of the anterior cerebral arteries supplied bilaterally from the right.  Moderate narrowing proximal A1 segment left anterior cerebral artery.  Vessel at the anterior communicating artery region appears to be branch rather than aneurysm.  Occluded right vertebral artery and right posterior inferior cerebral artery.  High-grade multiple stenosis of the distal left vertebral artery.  Moderate narrowing proximal basilar artery.  Moderate stenosis portions of the P1 segment of the posterior cerebral artery more notable on the right. Nonvisualized distal branches of the right posterior cerebral artery.  IMPRESSION: MRI HEAD  Acute small nonhemorrhagic infarcts spread throughout the left frontal lobe (parasagittal distribution), left temporal lobe and at the left temporal-parietal lobe junction (periatrial region).  Remote bilateral cerebellar infarcts more notable on the right. Remote tiny right thalamic infarcts. Mild small vessel disease type changes.  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 Reconstitution of flow left supraclinoid region appears inadequate as only a small amount of flow seen at the left carotid terminus and portions of the left middle cerebral artery. Findings new from the prior exam.  Moderate to marked stenosis right internal carotid artery cavernous segment.  A2 segment of the anterior cerebral arteries supplied bilaterally from the right.  Moderate narrowing proximal A1 segment left anterior cerebral artery.  Occluded right vertebral artery and  right posterior inferior cerebral artery. This is noted previously.  High-grade multiple stenosis of the distal left vertebral artery. This is noted previously.  Moderate narrowing proximal basilar artery. This is noted previously.  Moderate stenosis portions of the P1 segment of the posterior cerebral artery more notable on the right. Nonvisualized distal branches of the right posterior cerebral artery.  These results will be called to the ordering clinician or representative by the Radiologist Assistant, and communication documented in the PACS or zVision Dashboard.  Electronically Signed: By: Chauncey Cruel [name] On: 02/26/2014 08:49     PHYSICAL EXAM Frail middle aged Caucasian male not in distress.Awake alert. Afebrile. Head is nontraumatic. Neck is supple without bruit. Hearing is normal. Cardiac exam no murmur or gallop. Lungs are clear to auscultation. Distal pulses are well moderate  Neurological Exam : Awake alert oriented x 3. Moderate expressive aphasia . Impaired naming, repitition. Mild right lower face asymmetry. Tongue midline. No drift. Mild diminished fine finger movements on right. Orbits left over right upper extremity. Mild right grip weak.. Normal sensation . Normal coordination.gait deferred ASSESSMENT/PLAN Mr. CLEVE PAOLILLO is a 73 y.o. 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 suboptimal reconstitution of supraclinoid LICA Carotid Doppler  Findings suggest 1-39% right internal carotid artery stenosis. The left internal carotid artery waveform is  atypical, suggestive of a possible distal obstruction. Vertebral arteries are patent with antegrade flow.    2D Echo   Left ventricle: The cavity size was normal. There was mild focal basal hypertrophy of the septum. Systolic function was at the lower limits of normal. The estimated ejection fraction was in the range of 50% to 55%.  LDL 52 mg%  HgbA1c 9.0  SCDs for VTE prophylaxis  Diet heart healthy/carb modified     aspirin 325 mg orally every day prior to admission, now on clopidogrel 75 mg orally every day  Patient counseled to be compliant with his antithrombotic medications  Ongoing aggressive risk factor management  Therapy recommendations:  pending  Disposition:  pending  Hypertension  Home meds:  Will resume   stable  Patient counseled to be compliant with his blood pressure medications  Hyperlipidemia  Home meds:    resumed in hospital  LDL 52, goal < 70  Continue statin at discharge  Diabetes  HgbA1c 9 goal < 7.0  Uncontrolled  Other Stroke Risk Factors  Advanced age  Obesity, Body mass index is 30.23 kg/(m^2).   No  Hx stroke/TIA  Family hx stroke  Obstructive sleep apnea, on CPAP at home    Other Pertinent History I have personally examined this patient, reviewed notes, independently viewed imaging studies, participated in medical decision making and plan of care. I have made any additions or clarifications directly to the above note. Agree with note above. Check CT angio brain and neck to confirm LICA occlusion versus string sign. Add Aspirin 81 mg daily to plavix for 3 months then plavix alone. D/W Dr Eliseo Squires and patient  Antony Contras, MD Medical Director [facility] Pager: [phone] 02/26/2014 3:25 PM   Hospital day # 1    To contact Stroke Continuity provider, please refer to [email or web address] After hours, contact General Neurology

## 2014-02-26 NOTE — Progress Notes (Signed)
Called E.D for report awaiting patient arrival on unit.

## 2014-02-26 NOTE — Progress Notes (Signed)
Patient arrived onunitin roon 4N25 accompanied by spouse.Patient oriented to room. Spouse and patient informedon procedures to follow as ordered for now.

## 2014-02-26 NOTE — H&P (Signed)
Peter Nicholson is an 73 y.o. male.    Pcp: Teressa Lower  Chief Complaint: dysarthria HPI: 73 yo male with hx of htn, dm2, CVA c/o dysarthria beginning about 9:30pm.  Pt states that he had difficulty expressing himself.  Pt brought to ED and CT scan was negative for any acute process.  Pt seen by neurology who didn't advocate TPA at this time.  Pt still having symptoms of dysarthria.   Pt will be admitted for w/up  Of TIA/CVA.    Past Medical History  Diagnosis Date  . Obstructive sleep apnea (adult) (pediatric)     doesnt wear CPAP  . HTN (hypertension)   . Cerebrovascular disease, unspecified   . Unspecified venous (peripheral) insufficiency   . Pure hypercholesterolemia   . Type II or unspecified type diabetes mellitus without mention of complication, not stated as uncontrolled   . Overweight(278.02)   . Diaphragmatic hernia without mention of obstruction or gangrene   . Esophageal reflux   . Diverticulosis of colon (without mention of hemorrhage)   . Other specified congenital anomaly of kidney   . Kidney carcinoma   . Calculus of kidney   . Gout, unspecified   . Lumbago   . Anxiety state, unspecified   . History of gallstones   . IBS (irritable bowel syndrome)   . History of seizures   . Bowel obstruction   . Osteoarthrosis, unspecified whether generalized or localized, unspecified site     tendonitis  . Blood transfusion without reported diagnosis   . Stroke     2001  . Seizures     last seizure November 2012    Past Surgical History  Procedure Laterality Date  . Incisional hernia repair  1/96    Dr.Leone  . Nasal sinus surgery  12/96    Dr.Redman  . Cholecystectomy  9/04    Dr. Rebekah Chesterfield  . 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 Carotid endarterectomy  09/2000    Dr.Lawson  . Veterbral art angioplasty      x2  . Small intestine surgery    . External fixation leg  03/10/2011    Procedure: EXTERNAL FIXATION LEG;  Surgeon: Rozanna Box;  Location: [facility];  Service: Orthopedics;  Laterality: Right;  . Orif tibia plateau  03/24/2011    Procedure: OPEN REDUCTION INTERNAL FIXATION (ORIF) TIBIAL PLATEAU;  Surgeon: Rozanna Box;  Location: [facility];  Service: Orthopedics;  Laterality: Right;    Family History  Problem Relation Age of Onset  . Heart attack Father   . Dementia Mother   . Ovarian cancer Paternal Grandmother   . Breast cancer Sister   . Colon cancer Neg Hx   . Esophageal cancer Neg Hx   . Rectal cancer Neg Hx   . Stomach cancer Neg Hx    Social History:  reports that he has never smoked. He has never used smokeless tobacco. He reports that he drinks alcohol. He reports that he does not use illicit drugs.  Allergies:  Allergies  Allergen Reactions  . Dilaudid [Hydromorphone Hcl]   . Promethazine Hcl     REACTION: hallucinations     (Not in a hospital admission)  Results for orders placed or performed during the hospital encounter of 02/25/14 (from the past 48 hour(s))  Ethanol     Status: None   Collection Time: 02/25/14 11:10 PM  Result Value Ref Range   Alcohol, Ethyl (B) <11  0 - 11 mg/dL    Comment:        LOWEST DETECTABLE LIMIT FOR SERUM ALCOHOL IS 11 mg/dL FOR MEDICAL PURPOSES ONLY   Protime-INR     Status: None   Collection Time: 02/25/14 11:10 PM  Result Value Ref Range   Prothrombin Time 13.4 11.6 - 15.2 seconds   INR 1.01 0.00 - 1.49  APTT     Status: None   Collection Time: 02/25/14 11:10 PM  Result Value Ref Range   aPTT 26 24 - 37 seconds  CBC     Status: Abnormal   Collection Time: 02/25/14 11:10 PM  Result Value Ref Range   WBC 9.9 4.0 - 10.5 K/uL   RBC 4.12 (L) 4.22 - 5.81 MIL/uL   Hemoglobin 13.2 13.0 - 17.0 g/dL   HCT 39.5 39.0 - 52.0 %   MCV 95.9 78.0 - 100.0 fL   MCH 32.0 26.0 - 34.0 pg   MCHC 33.4 30.0 - 36.0 g/dL   RDW 11.9 11.5 - 15.5 %   Platelets 246 150 - 400 K/uL  Differential     Status: None   Collection Time: 02/25/14 11:10 PM  Result Value Ref  Range   Neutrophils Relative % 74 43 - 77 %   Neutro Abs 7.4 1.7 - 7.7 K/uL   Lymphocytes Relative 16 12 - 46 %   Lymphs Abs 1.6 0.7 - 4.0 K/uL   Monocytes Relative 6 3 - 12 %   Monocytes Absolute 0.5 0.1 - 1.0 K/uL   Eosinophils Relative 3 0 - 5 %   Eosinophils Absolute 0.3 0.0 - 0.7 K/uL   Basophils Relative 1 0 - 1 %   Basophils Absolute 0.1 0.0 - 0.1 K/uL  Comprehensive metabolic panel     Status: Abnormal   Collection Time: 02/25/14 11:10 PM  Result Value Ref Range   Sodium 140 137 - 147 mEq/L   Potassium 4.2 3.7 - 5.3 mEq/L   Chloride 101 96 - 112 mEq/L   CO2 24 19 - 32 mEq/L   Glucose, Bld 162 (H) 70 - 99 mg/dL   BUN 25 (H) 6 - 23 mg/dL   Creatinine, Ser 1.29 0.50 - 1.35 mg/dL   Calcium 9.9 8.4 - 10.5 mg/dL   Total Protein 7.0 6.0 - 8.3 g/dL   Albumin 3.5 3.5 - 5.2 g/dL   AST 18 0 - 37 U/L   ALT 16 0 - 53 U/L   Alkaline Phosphatase 101 39 - 117 U/L   Total Bilirubin 0.2 (L) 0.3 - 1.2 mg/dL   GFR calc non Af Amer 53 (L) >90 mL/min   GFR calc Af Amer 61 (L) >90 mL/min    Comment: (NOTE) The eGFR has been calculated using the CKD EPI equation. This calculation has not been validated in all clinical situations. eGFR's persistently <90 mL/min signify possible Chronic Kidney Disease.    Anion gap 15 5 - 15  I-Stat Troponin, ED (not at [facility])     Status: None   Collection Time: 02/25/14 11:18 PM  Result Value Ref Range   Troponin i, poc 0.01 0.00 - 0.08 ng/mL   Comment 3            Comment: Due to the release kinetics of cTnI, a negative result within the first hours of the onset of symptoms does not rule out myocardial infarction with certainty. If myocardial infarction is still suspected, repeat the test at appropriate intervals.   I-Stat Chem 8, ED  Status: Abnormal   Collection Time: 02/25/14 11:19 PM  Result Value Ref Range   Sodium 140 137 - 147 mEq/L   Potassium 4.0 3.7 - 5.3 mEq/L   Chloride 103 96 - 112 mEq/L   BUN 27 (H) 6 - 23 mg/dL   Creatinine,  Ser 1.40 (H) 0.50 - 1.35 mg/dL   Glucose, Bld 163 (H) 70 - 99 mg/dL   Calcium, Ion 1.27 1.13 - 1.30 mmol/L   TCO2 25 0 - 100 mmol/L   Hemoglobin 13.6 13.0 - 17.0 g/dL   HCT 40.0 39.0 - 52.0 %   Ct Head Wo Contrast  02/25/2014   CLINICAL DATA:  Code stroke. Trouble finding words at 1400 hr today.  EXAM: CT HEAD WITHOUT CONTRAST  TECHNIQUE: Contiguous axial images were obtained from the base of the skull through the vertex without intravenous contrast.  COMPARISON:  CT angio head 03/12/2011.  MRI brain 07/21/2012.  FINDINGS: Ventricles and sulci appear symmetrical. Probable old cerebellar infarcts. Patchy low-attenuation changes in the deep white matter consistent with small vessel ischemia. No mass effect or midline shift. No abnormal extra-axial fluid collections. Gray-white matter junctions are distinct. Basal cisterns are not effaced. No evidence of acute intracranial hemorrhage. No depressed skull fractures. Visualized paranasal sinuses and mastoid air cells are not opacified.  IMPRESSION: No acute intracranial abnormalities. Chronic small vessel ischemic changes and probable old cerebellar infarcts.  These results were called by telephone at the time of interpretation on 02/25/2014 at 11:29 pm to Dr. Jola Schmidt , who verbally acknowledged these results.   Electronically Signed   By: Lucienne Capers [name]   On: 02/25/2014 23:30    Review of Systems  Constitutional: Negative for fever, chills, weight loss, malaise/fatigue and diaphoresis.  HENT: Negative for congestion, ear discharge, ear pain, hearing loss, nosebleeds, sore throat and tinnitus.   Eyes: Negative for blurred vision, double vision, photophobia, pain, discharge and redness.  Respiratory: Negative for cough, hemoptysis, sputum production, shortness of breath, wheezing and stridor.   Cardiovascular: Negative for chest pain, palpitations, orthopnea, claudication, leg swelling and PND.  Gastrointestinal: Negative for heartburn,  nausea, vomiting, abdominal pain, diarrhea, constipation, blood in stool and melena.  Genitourinary: Negative for dysuria, urgency, frequency, hematuria and flank pain.  Musculoskeletal: Positive for back pain. Negative for myalgias, joint pain, falls and neck pain.  Skin: Negative for itching and rash.  Neurological: Positive for speech change. Negative for dizziness, tingling, tremors, sensory change, focal weakness, seizures, loss of consciousness, weakness and headaches.  Endo/Heme/Allergies: Negative for environmental allergies and polydipsia. Does not bruise/bleed easily.  Psychiatric/Behavioral: Negative for depression, suicidal ideas, hallucinations, memory loss and substance abuse. The patient is not nervous/anxious and does not have insomnia.     Blood pressure 153/87, pulse 113, temperature 98.7 F (37.1 C), temperature source Oral, resp. rate 21, height '5\' 6"'  (1.676 m), weight 86.183 kg (190 lb), SpO2 98 %. Physical Exam  Constitutional: He is oriented to person, place, and time. He appears well-developed and well-nourished.  HENT:  Head: Normocephalic and atraumatic.  Eyes: Conjunctivae and EOM are normal. Pupils are equal, round, and reactive to light.  Neck: Normal range of motion. Neck supple. No JVD present. No tracheal deviation present. No thyromegaly present.  Cardiovascular: Normal rate and regular rhythm.  Exam reveals no gallop and no friction rub.   No murmur heard. Respiratory: Effort normal and breath sounds normal. No stridor. No respiratory distress. He has no wheezes. He has no rales. He exhibits no tenderness.  GI: Soft. Bowel sounds are normal. He exhibits no distension. There is no tenderness. There is no rebound and no guarding.  Musculoskeletal: Normal range of motion. He exhibits no edema or tenderness.  Neurological: He is alert and oriented to person, place, and time. He has normal reflexes. He displays normal reflexes. No cranial nerve deficit. He exhibits  normal muscle tone. Coordination normal.  + dysarthria  Skin: Skin is warm and dry. No rash noted. No erythema. No pallor.  Psychiatric: He has a normal mood and affect. His behavior is normal. Judgment and thought content normal.     Assessment/Plan Dysarthria r/o TIA/CVA Check MRI/MRA brain Check carotid ultrasound, check cardiac echo Pt is already on aspirin,  We will switch to plavix 72m po qday.  Appreciate neurology input  Dm2: fsbs ac and qhs Pt appears to have passed nursing bedside swallow,  Diabetic diet  Tachycardia Check tsh, check trop q6h x3 Check echocardiogram  Renal insufficiency: Hydrate gently with normal saline.  Check cmp in am  Hypertension: allow permissive hypertension at this time Continue with current bp medications.   DVT prophylaxis SCD,    KJON, LALL11/16/2015, 12:44 AM

## 2014-02-26 NOTE — Progress Notes (Signed)
*  PRELIMINARY RESULTS* Vascular Ultrasound Carotid Duplex (Doppler) has been completed.  Findings suggest 1-39% right internal carotid artery stenosis. The left internal carotid artery waveform is atypical, suggestive of a possible distal obstruction. Vertebral arteries are patent with antegrade flow.  02/26/2014 11:23 AM Maudry Mayhew, RVT, RDCS, RDMS

## 2014-02-26 NOTE — Progress Notes (Signed)
  Echocardiogram 2D Echocardiogram has been performed.  Diamond Nickel 02/26/2014, 9:39 AM

## 2014-02-27 ENCOUNTER — Inpatient Hospital Stay (HOSPITAL_COMMUNITY): Payer: Medicare Other

## 2014-02-27 DIAGNOSIS — I6310 Cerebral infarction due to embolism of unspecified precerebral artery: Secondary | ICD-10-CM

## 2014-02-27 DIAGNOSIS — E780 Pure hypercholesterolemia: Secondary | ICD-10-CM

## 2014-02-27 DIAGNOSIS — I63132 Cerebral infarction due to embolism of left carotid artery: Secondary | ICD-10-CM | POA: Diagnosis not present

## 2014-02-27 LAB — CBC
HCT: 36.2 % — ABNORMAL LOW (ref 39.0–52.0)
Hemoglobin: 12 g/dL — ABNORMAL LOW (ref 13.0–17.0)
MCH: 31.3 pg (ref 26.0–34.0)
MCHC: 33.1 g/dL (ref 30.0–36.0)
MCV: 94.3 fL (ref 78.0–100.0)
PLATELETS: 203 10*3/uL (ref 150–400)
RBC: 3.84 MIL/uL — ABNORMAL LOW (ref 4.22–5.81)
RDW: 12 % (ref 11.5–15.5)
WBC: 8.9 10*3/uL (ref 4.0–10.5)

## 2014-02-27 LAB — BASIC METABOLIC PANEL
Anion gap: 10 (ref 5–15)
BUN: 17 mg/dL (ref 6–23)
CALCIUM: 9.2 mg/dL (ref 8.4–10.5)
CO2: 24 mEq/L (ref 19–32)
CREATININE: 1.15 mg/dL (ref 0.50–1.35)
Chloride: 103 mEq/L (ref 96–112)
GFR calc Af Amer: 71 mL/min — ABNORMAL LOW (ref >90)
GFR, EST NON AFRICAN AMERICAN: 61 mL/min — AB (ref >90)
GLUCOSE: 189 mg/dL — AB (ref 70–99)
Potassium: 5.3 mEq/L (ref 3.7–5.3)
Sodium: 137 mEq/L (ref 137–147)

## 2014-02-27 LAB — GLUCOSE, CAPILLARY
Glucose-Capillary: 119 mg/dL — ABNORMAL HIGH (ref 70–99)
Glucose-Capillary: 191 mg/dL — ABNORMAL HIGH (ref 70–99)
Glucose-Capillary: 119 mg/dL — ABNORMAL HIGH (ref 70–99)
Glucose-Capillary: 196 mg/dL — ABNORMAL HIGH (ref 70–99)

## 2014-02-27 LAB — LIPID PANEL
Cholesterol: 124 mg/dL (ref 0–200)
HDL: 40 mg/dL (ref >39)
LDL Cholesterol: 60 mg/dL (ref 0–99)
TRIGLYCERIDES: 118 mg/dL (ref <150)
Total CHOL/HDL Ratio: 3.1 RATIO
VLDL: 24 mg/dL (ref 0–40)

## 2014-02-27 MED ORDER — SODIUM CHLORIDE 0.9 % IV SOLN
INTRAVENOUS | Status: DC
  Administered 2014-02-27: 14:00:00 via INTRAVENOUS

## 2014-02-27 MED ORDER — SODIUM CHLORIDE 0.9 % IV SOLN: INTRAVENOUS | Status: AC

## 2014-02-27 MED ORDER — IOHEXOL 300 MG/ML  SOLN
150.0000 mL | Freq: Once | INTRAMUSCULAR | Status: AC | PRN
  Administered 2014-02-27: 80 mL via INTRA_ARTERIAL

## 2014-02-27 MED ORDER — HEPARIN SOD (PORK) LOCK FLUSH 100 UNIT/ML IV SOLN
INTRAVENOUS | Status: AC | PRN
  Administered 2014-02-27: 1000 [IU] via INTRAVENOUS

## 2014-02-27 MED ORDER — FENTANYL CITRATE 0.05 MG/ML IJ SOLN
INTRAMUSCULAR | Status: AC
  Filled 2014-02-27: qty 2

## 2014-02-27 MED ORDER — HYDRALAZINE HCL 20 MG/ML IJ SOLN
INTRAMUSCULAR | Status: AC
  Filled 2014-02-27: qty 1

## 2014-02-27 MED ORDER — LIDOCAINE HCL 1 % IJ SOLN
INTRAMUSCULAR | Status: AC
  Filled 2014-02-27: qty 20

## 2014-02-27 MED ORDER — MIDAZOLAM HCL 2 MG/2ML IJ SOLN
INTRAMUSCULAR | Status: AC | PRN
  Administered 2014-02-27: 1 mg via INTRAVENOUS

## 2014-02-27 MED ORDER — MIDAZOLAM HCL 2 MG/2ML IJ SOLN
INTRAMUSCULAR | Status: AC
  Filled 2014-02-27: qty 2

## 2014-02-27 MED ORDER — HEPARIN SOD (PORK) LOCK FLUSH 100 UNIT/ML IV SOLN
INTRAVENOUS | Status: AC
  Filled 2014-02-27: qty 5

## 2014-02-27 MED ORDER — HYDRALAZINE HCL 20 MG/ML IJ SOLN
INTRAMUSCULAR | Status: AC | PRN
  Administered 2014-02-27: 5 mg via INTRAVENOUS

## 2014-02-27 NOTE — Progress Notes (Signed)
PROGRESS NOTE  Peter Nicholson LHT:[id number] DOB: 10-01-1940 DOA: 02/25/2014 PCP: Noralee Space, MD  Assessment/Plan: Dysarthria r/o TIA/CVA MRI/MRA brain: Acute small nonhemorrhagic infarcts spread throughout the left frontal lobe (parasagittal distribution), left temporal lobe and at the left temporal-parietal lobe junction (periatrial region). Remote bilateral cerebellar infarcts more notable on the right. Remote tiny right thalamic infarcts. Mild small vessel disease type changes. carotid ultrasound:Findings suggest 1-39% right internal carotid artery stenosis. The left internal carotid artery waveform is atypical, suggestive of a possible distal obstruction. Vertebral arteries are patent with antegrade flow. -CTA done that shows multiple areas of blockage cardiac echo: see below ASA + plavix 75mg  po qday.  Appreciate neurology input  Dm2: fsbs ac and qhs Pt appears to have passed nursing bedside swallow,  Diabetic diet  Tachycardia tsh ok, check trop q6h x3- negative  echocardiogram ok  Renal insufficiency- baseline 1.2 Hydrate gently with normal saline.   Hypertension: allow permissive hypertension at this time Continue with current bp medications.   Code Status: full Family Communication: patient/wife Disposition Plan:    Consultants:  neuro  Procedures:  Echo: Study Conclusions  - Left ventricle: The cavity size was normal. There was mild focal basal hypertrophy of the septum. Systolic function was at the lower limits of normal. The estimated ejection fraction was in the range of 50% to 55%. Although no diagnostic regional wall motion abnormality was identified, this possibility cannot be completely excluded on the basis of this study. Doppler parameters are consistent with abnormal left ventricular relaxation (grade 1 diastolic dysfunction). - Aortic valve: There was mild regurgitation. - Mitral valve: Calcified  annulus.   HPI/Subjective: Speech mildly improved  Objective: Filed Vitals:   02/27/14 0603  BP: 138/72  Pulse: 91  Temp: 98.6 F (37 C)  Resp: 18    Intake/Output Summary (Last 24 hours) at 02/27/14 0856 Last data filed at 02/26/14 2125  Gross per 24 hour  Intake    123 ml  Output    850 ml  Net   -727 ml   Filed Weights   02/25/14 2300 02/26/14 0142  Weight: 86.183 kg (190 lb) 84.913 kg (187 lb 3.2 oz)    Exam:   General:  Pleasant/cooperative  Cardiovascular: rrr  Respiratory: clear anterior  Abdomen: +Bs, soft  Musculoskeletal: no edema   Data Reviewed: Basic Metabolic Panel:  Recent Labs Lab 02/25/14 2310 02/25/14 2319  NA 140 140  K 4.2 4.0  CL 101 103  CO2 24  --   GLUCOSE 162* 163*  BUN 25* 27*  CREATININE 1.29 1.40*  CALCIUM 9.9  --    Liver Function Tests:  Recent Labs Lab 02/25/14 2310  AST 18  ALT 16  ALKPHOS 101  BILITOT 0.2*  PROT 7.0  ALBUMIN 3.5   No results for input(s): LIPASE, AMYLASE in the last 168 hours. No results for input(s): AMMONIA in the last 168 hours. CBC:  Recent Labs Lab 02/25/14 2310 02/25/14 2319  WBC 9.9  --   NEUTROABS 7.4  --   HGB 13.2 13.6  HCT 39.5 40.0  MCV 95.9  --   PLT 246  --    Cardiac Enzymes:  Recent Labs Lab 02/26/14 0416 02/26/14 0525 02/26/14 1221  TROPONINI <0.30 <0.30 <0.30   BNP (last 3 results) No results for input(s): PROBNP in the last 8760 hours. CBG:  Recent Labs Lab 02/26/14 0620 02/26/14 1132 02/26/14 1709 02/26/14 2110 02/27/14 0656  GLUCAP 157* 173* 99 166* 119*  No results found for this or any previous visit (from the past 240 hour(s)).   Studies: Ct Angio Head W/cm &/or Wo Cm  02/26/2014   EXAM: CT ANGIOGRAPHY HEAD AND NECK  TECHNIQUE: Multidetector CT imaging of the head and neck was performed using the standard protocol during bolus administration of intravenous contrast. Multiplanar CT image reconstructions and MIPs were obtained to  evaluate the vascular anatomy. Carotid stenosis measurements (when applicable) are obtained utilizing NASCET criteria, using the distal internal carotid diameter as the denominator.  CONTRAST:  69mL OMNIPAQUE IOHEXOL 350 MG/ML SOLN  COMPARISON:  Prior MRI from earlier the same day as well as prior CTA from 03/12/2011.  FINDINGS: CTA HEAD FINDINGS  ANTERIOR CIRCULATION:  The petrous and cavernous left ICA are occluded. There is reconstitution of the supra clinoid left ICA, likely via collateral flow cross the circle of Willis and VA the posterior circulation via the left posterior communicating artery. Petrous, cavernous, and supra clinoid right ICA are well opacified without hemodynamically significant stenosis. On the right, petrous right ICA is well opacified. There is calcified plaque within the cavernous right ICA with associated stenosis of up to 70% by NASCET criteria. Supra clinoid right ICA is well opacified.  Right A1 segment well opacified. The left A1 segment is hypoplastic. Anterior communicating artery grossly normal. Anterior cerebral arteries well opacified.  Multi focal atherosclerotic irregularity present within the left M1 segment without hemodynamically significant stenosis or occlusion. Right M1 segment is well opacified. Distal MCA branches symmetric bilaterally.  POSTERIOR CIRCULATION:  Heavy atheromatous plaque present within the distal aspects of the vertebral arteries bilaterally. There is occlusion of the right vertebral artery just distal to the takeoff of the right posterior inferior cerebral artery. Heavy atherosclerotic plaque within the left V4 segment is grossly stable relative to prior. Atherosclerotic irregularity seen at the vertebrobasilar junction. The basilar artery itself is widely patent without hemodynamically significant stenosis. Anterior inferior cerebral arteries and superior cerebral arteries are patent bilaterally.  There is atherosclerotic irregularity at the origin of  the posterior cerebral arteries bilaterally with moderate stenosis of both P1 segments. Flow within the right PCA is attenuated, similar to prior. Posterior communicating arteries are diminutive or absent.  CTA NECK FINDINGS  Visualized aortic arch is of normal caliber with normal 3 vessel morphology. No high-grade stenosis seen at the origin of the great vessels.  Prominent atheromatous plaque present at the origin of the right subclavian artery from the right brachiocephalic artery. Prominent calcified and noncalcified plaque present within the proximal left subclavian artery is well. No associated high-grade stenosis. Subclavian arteries well opacified distally.  Common carotid arteries are well opacified bilaterally without hemodynamically significant stenosis or occlusion.  Calcified and noncalcified plaque present about the carotid bifurcations bilaterally, left greater than right.  Right internal carotid artery is well opacified to the level of the skullbase without hemodynamically significant stenosis, occlusion, or dissection. Calcified plaque within the proximal right internal carotid artery resultant short-segment stenosis of approximately 30% by NASCET criteria.  On the left, sequela of prior carotid endarterectomy seen within the proximal left ICA. Just distal to the endarterectomy site, flow within the left ICA is markedly attenuated up to the level of the skullbase. There is absent flow within the petrous segment.  External carotid arteries and their branches are within normal limits.  Both vertebral arteries arise from the subclavian arteries. Left vertebral artery is dominant. Vertebral arteries well opacified up to the level of the skullbase without dissection or hemodynamically  significant stenosis.  Thyroid gland within normal limits.  No adenopathy.  Partially visualized lung apices are clear.  No acute osseous abnormality. No worrisome lytic or blastic osseous lesions.  IMPRESSION: CTA NECK  IMPRESSION:  1. Prior left carotid endarterectomy with markedly attenuated flow within the left ICA distal to the endarterectomy, which then becomes completely occluded at the level of the skullbase/petrous segment of the left ICA. 2. Relatively mild right carotid atherosclerosis without hemodynamically significant stenosis in the neck. 3. Diminutive right vertebral artery, patent within the neck, but occludes at the skullbase just beyond the origin of the right PICA. 4. Dominant left vertebral artery, patent within the neck.  CTA HEAD IMPRESSION:  1. Occlusion of the petrous and cavernous left ICA. There is some reconstitution of the distal supra clinoid left ICA likely via collateral flow across the circle of Willis or via flow from the posterior circulation via the posterior communicating arteries. 2. Moderate to severe atheromatous stenosis within the cavernous right ICA. 3. Severe distal vertebral artery atherosclerosis. The distal right vertebral artery is occluded. There is chronic high-grade stenosis of the distal left vertebral artery. These changes are similar to prior studies. 4. Moderate atheromatous stenosis of the proximal posterior cerebral arteries bilaterally.   Electronically Signed   By: Jeannine Boga [name]   On: 02/26/2014 23:52   Ct Head Wo Contrast  02/25/2014   CLINICAL DATA:  Code stroke. Trouble finding words at 1400 hr today.  EXAM: CT HEAD WITHOUT CONTRAST  TECHNIQUE: Contiguous axial images were obtained from the base of the skull through the vertex without intravenous contrast.  COMPARISON:  CT angio head 03/12/2011.  MRI brain 07/21/2012.  FINDINGS: Ventricles and sulci appear symmetrical. Probable old cerebellar infarcts. Patchy low-attenuation changes in the deep white matter consistent with small vessel ischemia. No mass effect or midline shift. No abnormal extra-axial fluid collections. Gray-white matter junctions are distinct. Basal cisterns are not effaced. No evidence  of acute intracranial hemorrhage. No depressed skull fractures. Visualized paranasal sinuses and mastoid air cells are not opacified.  IMPRESSION: No acute intracranial abnormalities. Chronic small vessel ischemic changes and probable old cerebellar infarcts.  These results were called by telephone at the time of interpretation on 02/25/2014 at 11:29 pm to Dr. Jola Schmidt , who verbally acknowledged these results.   Electronically Signed   By: Lucienne Capers [name]   On: 02/25/2014 23:30   Ct Angio Neck W/cm &/or Wo/cm  02/26/2014   EXAM: CT ANGIOGRAPHY HEAD AND NECK  TECHNIQUE: Multidetector CT imaging of the head and neck was performed using the standard protocol during bolus administration of intravenous contrast. Multiplanar CT image reconstructions and MIPs were obtained to evaluate the vascular anatomy. Carotid stenosis measurements (when applicable) are obtained utilizing NASCET criteria, using the distal internal carotid diameter as the denominator.  CONTRAST:  81mL OMNIPAQUE IOHEXOL 350 MG/ML SOLN  COMPARISON:  Prior MRI from earlier the same day as well as prior CTA from 03/12/2011.  FINDINGS: CTA HEAD FINDINGS  ANTERIOR CIRCULATION:  The petrous and cavernous left ICA are occluded. There is reconstitution of the supra clinoid left ICA, likely via collateral flow cross the circle of Willis and VA the posterior circulation via the left posterior communicating artery. Petrous, cavernous, and supra clinoid right ICA are well opacified without hemodynamically significant stenosis. On the right, petrous right ICA is well opacified. There is calcified plaque within the cavernous right ICA with associated stenosis of up to 70% by NASCET criteria. Supra clinoid  right ICA is well opacified.  Right A1 segment well opacified. The left A1 segment is hypoplastic. Anterior communicating artery grossly normal. Anterior cerebral arteries well opacified.  Multi focal atherosclerotic irregularity present within the  left M1 segment without hemodynamically significant stenosis or occlusion. Right M1 segment is well opacified. Distal MCA branches symmetric bilaterally.  POSTERIOR CIRCULATION:  Heavy atheromatous plaque present within the distal aspects of the vertebral arteries bilaterally. There is occlusion of the right vertebral artery just distal to the takeoff of the right posterior inferior cerebral artery. Heavy atherosclerotic plaque within the left V4 segment is grossly stable relative to prior. Atherosclerotic irregularity seen at the vertebrobasilar junction. The basilar artery itself is widely patent without hemodynamically significant stenosis. Anterior inferior cerebral arteries and superior cerebral arteries are patent bilaterally.  There is atherosclerotic irregularity at the origin of the posterior cerebral arteries bilaterally with moderate stenosis of both P1 segments. Flow within the right PCA is attenuated, similar to prior. Posterior communicating arteries are diminutive or absent.  CTA NECK FINDINGS  Visualized aortic arch is of normal caliber with normal 3 vessel morphology. No high-grade stenosis seen at the origin of the great vessels.  Prominent atheromatous plaque present at the origin of the right subclavian artery from the right brachiocephalic artery. Prominent calcified and noncalcified plaque present within the proximal left subclavian artery is well. No associated high-grade stenosis. Subclavian arteries well opacified distally.  Common carotid arteries are well opacified bilaterally without hemodynamically significant stenosis or occlusion.  Calcified and noncalcified plaque present about the carotid bifurcations bilaterally, left greater than right.  Right internal carotid artery is well opacified to the level of the skullbase without hemodynamically significant stenosis, occlusion, or dissection. Calcified plaque within the proximal right internal carotid artery resultant short-segment  stenosis of approximately 30% by NASCET criteria.  On the left, sequela of prior carotid endarterectomy seen within the proximal left ICA. Just distal to the endarterectomy site, flow within the left ICA is markedly attenuated up to the level of the skullbase. There is absent flow within the petrous segment.  External carotid arteries and their branches are within normal limits.  Both vertebral arteries arise from the subclavian arteries. Left vertebral artery is dominant. Vertebral arteries well opacified up to the level of the skullbase without dissection or hemodynamically significant stenosis.  Thyroid gland within normal limits.  No adenopathy.  Partially visualized lung apices are clear.  No acute osseous abnormality. No worrisome lytic or blastic osseous lesions.  IMPRESSION: CTA NECK IMPRESSION:  1. Prior left carotid endarterectomy with markedly attenuated flow within the left ICA distal to the endarterectomy, which then becomes completely occluded at the level of the skullbase/petrous segment of the left ICA. 2. Relatively mild right carotid atherosclerosis without hemodynamically significant stenosis in the neck. 3. Diminutive right vertebral artery, patent within the neck, but occludes at the skullbase just beyond the origin of the right PICA. 4. Dominant left vertebral artery, patent within the neck.  CTA HEAD IMPRESSION:  1. Occlusion of the petrous and cavernous left ICA. There is some reconstitution of the distal supra clinoid left ICA likely via collateral flow across the circle of Willis or via flow from the posterior circulation via the posterior communicating arteries. 2. Moderate to severe atheromatous stenosis within the cavernous right ICA. 3. Severe distal vertebral artery atherosclerosis. The distal right vertebral artery is occluded. There is chronic high-grade stenosis of the distal left vertebral artery. These changes are similar to prior studies. 4. Moderate atheromatous  stenosis of the  proximal posterior cerebral arteries bilaterally.   Electronically Signed   By: Jeannine Boga [name]   On: 02/26/2014 23:52   [name] Wo Contrast  02/26/2014   ADDENDUM REPORT: 02/26/2014 11:21  ADDENDUM: These results were called by telephone at the time of interpretation on 02/26/2014 at 11:00 am to Dr. Eliseo Squires , who verbally acknowledged these results.   Electronically Signed   By: Chauncey Cruel [name]   On: 02/26/2014 11:21   02/26/2014   CLINICAL DATA:  73 year old diabetic hypertensive male presenting with dysarthria. Subsequent encounter.  EXAM: MRI HEAD WITHOUT CONTRAST  MRA HEAD WITHOUT CONTRAST  TECHNIQUE: Multiplanar, multiecho pulse sequences of the brain and surrounding structures were obtained without intravenous contrast. Angiographic images of the head were obtained using MRA technique without contrast.  COMPARISON:  02/25/2014 CT.  07/21/2012 MR.  FINDINGS: MRI HEAD FINDINGS  Acute small nonhemorrhagic infarcts spread throughout the left frontal lobe (parasagittal distribution), left temporal lobe and at the left temporal-parietal lobe junction (periatrial region).  Remote bilateral cerebellar infarcts more notable on the right. Remote tiny right thalamic infarcts. Mild small vessel disease type changes.  No hydrocephalus.  No intracranial hemorrhage.  No intracranial mass lesion noted on this unenhanced exam.  Mild cervical spondylotic changes upper cervical spine.  Abnormal appearance of the left internal carotid artery and right vertebral artery. Please see below.  MRA HEAD FINDINGS  Exam is motion degraded.  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 Reconstitution of flow left supraclinoid region appears inadequate as only a small amount of flow seen at the left carotid terminus and portions of the left middle cerebral artery. Findings new from the prior exam.  Moderate to marked stenosis right internal carotid artery cavernous segment.  A2 segment of the anterior cerebral arteries  supplied bilaterally from the right.  Moderate narrowing proximal A1 segment left anterior cerebral artery.  Vessel at the anterior communicating artery region appears to be branch rather than aneurysm.  Occluded right vertebral artery and right posterior inferior cerebral artery.  High-grade multiple stenosis of the distal left vertebral artery.  Moderate narrowing proximal basilar artery.  Moderate stenosis portions of the P1 segment of the posterior cerebral artery more notable on the right. Nonvisualized distal branches of the right posterior cerebral artery.  IMPRESSION: MRI HEAD  Acute small nonhemorrhagic infarcts spread throughout the left frontal lobe (parasagittal distribution), left temporal lobe and at the left temporal-parietal lobe junction (periatrial region).  Remote bilateral cerebellar infarcts more notable on the right. Remote tiny right thalamic infarcts. Mild small vessel disease type changes.  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 Reconstitution of flow left supraclinoid region appears inadequate as only a small amount of flow seen at the left carotid terminus and portions of the left middle cerebral artery. Findings new from the prior exam.  Moderate to marked stenosis right internal carotid artery cavernous segment.  A2 segment of the anterior cerebral arteries supplied bilaterally from the right.  Moderate narrowing proximal A1 segment left anterior cerebral artery.  Occluded right vertebral artery and right posterior inferior cerebral artery. This is noted previously.  High-grade multiple stenosis of the distal left vertebral artery. This is noted previously.  Moderate narrowing proximal basilar artery. This is noted previously.  Moderate stenosis portions of the P1 segment of the posterior cerebral artery more notable on the right. Nonvisualized distal branches of the right posterior cerebral artery.  These results will be called to the ordering clinician  or representative by  the Radiologist Assistant, and communication documented in the PACS or zVision Dashboard.  Electronically Signed: By: Chauncey Cruel [name] On: 02/26/2014 08:49   Mr Brain Wo Contrast  02/26/2014   ADDENDUM REPORT: 02/26/2014 11:21  ADDENDUM: These results were called by telephone at the time of interpretation on 02/26/2014 at 11:00 am to Dr. Eliseo Squires , who verbally acknowledged these results.   Electronically Signed   By: Chauncey Cruel [name]   On: 02/26/2014 11:21   02/26/2014   CLINICAL DATA:  73 year old diabetic hypertensive male presenting with dysarthria. Subsequent encounter.  EXAM: MRI HEAD WITHOUT CONTRAST  MRA HEAD WITHOUT CONTRAST  TECHNIQUE: Multiplanar, multiecho pulse sequences of the brain and surrounding structures were obtained without intravenous contrast. Angiographic images of the head were obtained using MRA technique without contrast.  COMPARISON:  02/25/2014 CT.  07/21/2012 MR.  FINDINGS: MRI HEAD FINDINGS  Acute small nonhemorrhagic infarcts spread throughout the left frontal lobe (parasagittal distribution), left temporal lobe and at the left temporal-parietal lobe junction (periatrial region).  Remote bilateral cerebellar infarcts more notable on the right. Remote tiny right thalamic infarcts. Mild small vessel disease type changes.  No hydrocephalus.  No intracranial hemorrhage.  No intracranial mass lesion noted on this unenhanced exam.  Mild cervical spondylotic changes upper cervical spine.  Abnormal appearance of the left internal carotid artery and right vertebral artery. Please see below.  MRA HEAD FINDINGS  Exam is motion degraded.  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 Reconstitution of flow left supraclinoid region appears inadequate as only a small amount of flow seen at the left carotid terminus and portions of the left middle cerebral artery. Findings new from the prior exam.  Moderate to marked stenosis right internal carotid artery cavernous segment.  A2 segment of the  anterior cerebral arteries supplied bilaterally from the right.  Moderate narrowing proximal A1 segment left anterior cerebral artery.  Vessel at the anterior communicating artery region appears to be branch rather than aneurysm.  Occluded right vertebral artery and right posterior inferior cerebral artery.  High-grade multiple stenosis of the distal left vertebral artery.  Moderate narrowing proximal basilar artery.  Moderate stenosis portions of the P1 segment of the posterior cerebral artery more notable on the right. Nonvisualized distal branches of the right posterior cerebral artery.  IMPRESSION: MRI HEAD  Acute small nonhemorrhagic infarcts spread throughout the left frontal lobe (parasagittal distribution), left temporal lobe and at the left temporal-parietal lobe junction (periatrial region).  Remote bilateral cerebellar infarcts more notable on the right. Remote tiny right thalamic infarcts. Mild small vessel disease type changes.  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 Reconstitution of flow left supraclinoid region appears inadequate as only a small amount of flow seen at the left carotid terminus and portions of the left middle cerebral artery. Findings new from the prior exam.  Moderate to marked stenosis right internal carotid artery cavernous segment.  A2 segment of the anterior cerebral arteries supplied bilaterally from the right.  Moderate narrowing proximal A1 segment left anterior cerebral artery.  Occluded right vertebral artery and right posterior inferior cerebral artery. This is noted previously.  High-grade multiple stenosis of the distal left vertebral artery. This is noted previously.  Moderate narrowing proximal basilar artery. This is noted previously.  Moderate stenosis portions of the P1 segment of the posterior cerebral artery more notable on the right. Nonvisualized distal branches of the right posterior cerebral artery.  These results will be called to the ordering  clinician or representative by the Radiologist Assistant, and communication documented in the PACS or zVision Dashboard.  Electronically Signed: By: Chauncey Cruel [name] On: 02/26/2014 08:49    Scheduled Meds: . allopurinol  300 mg Oral Daily  . aspirin EC  81 mg Oral Daily  . cholecalciferol  1,000 Units Oral Daily  . clopidogrel  75 mg Oral Daily  . Influenza vac split quadrivalent PF  0.5 mL Intramuscular Tomorrow-1000  . insulin aspart  0-5 Units Subcutaneous QHS  . insulin aspart  0-9 Units Subcutaneous TID WC  . insulin glargine  10 Units Subcutaneous QHS  . linagliptin  5 mg Oral Daily  . pantoprazole  40 mg Oral Daily  . simvastatin  40 mg Oral q1800  . sodium chloride  3 mL Intravenous Q12H   Continuous Infusions:  Antibiotics Given (last 72 hours)    None      Active Problems:   Diabetes   HYPERCHOLESTEROLEMIA   TIA (transient ischemic attack)   Tachycardia   Dysarthria   CVA (cerebral infarction)    Time spent: 25 min    Peter Nicholson  [facility] Pager [phone]. If 7PM-7AM, please contact night-coverage at [email or web address], password [facility] 02/27/2014, 8:56 AM  LOS: 2 days

## 2014-02-27 NOTE — Procedures (Signed)
S/P 4 vessel cerebral arteriogram  RT CFA approach. Findings. 1.Severe preocclusive stenosis of Lt ICA prox. 2.80 to 85% stenosis of RT ICA prox cavsegment. 3.80% stenosis of dominant Lt VBJ just prox to basilar artery. 4.Occluded non dominant  RT VBJ at C1

## 2014-02-27 NOTE — Evaluation (Signed)
Physical Therapy Evaluation Patient Details Name: Peter Nicholson MRN: [id number] DOB: Oct 07, 1940 Today's Date: 02/27/2014   History of Present Illness  Adm 02/26/14 with Lt watershed infarcts due to occlusion of Lt ICA. PMHx- HTN, DM, OSA, Rt CVA '01 (with near resolution), kidney Ca  Clinical Impression  Pt admitted with Lt CVA with primarily cognitive and communication deficits. Cognition potentially impacting safety with mobility. Pt currently with functional limitations due to the deficits listed below (see PT Problem List).  Pt will benefit from skilled PT to increase their independence and safety with mobility to allow discharge to the venue listed below.       Follow Up Recommendations Outpatient PT;Supervision for mobility/OOB (vs no f/u PT needs (TBA))    Equipment Recommendations  None recommended by PT    Recommendations for Other Services       Precautions / Restrictions Precautions Precautions: Fall      Mobility  Bed Mobility                  Transfers Overall transfer level: Needs assistance Equipment used: None Transfers: Sit to/from Stand Sit to Stand: Supervision         General transfer comment: supervision for safety  Ambulation/Gait Ambulation/Gait assistance: Min guard;Supervision Ambulation Distance (Feet): 200 Feet Assistive device: None Gait Pattern/deviations: Step-through pattern;Decreased stride length;Drifts right/left Gait velocity: decr but able to vary   General Gait Details: drifts or decr velocity with head turns  Stairs            Wheelchair Mobility    Modified Rankin (Stroke Patients Only) Modified Rankin (Stroke Patients Only) Pre-Morbid Rankin Score: No symptoms Modified Rankin: Moderately severe disability     Balance Overall balance assessment: Needs assistance                               Standardized Balance Assessment Standardized Balance Assessment : Berg Balance Test Berg Balance  Test Sit to Stand: Able to stand without using hands and stabilize independently Standing Unsupported: Able to stand safely 2 minutes Sitting with Back Unsupported but Feet Supported on Floor or Stool: Able to sit safely and securely 2 minutes Stand to Sit: Sits safely with minimal use of hands Transfers: Able to transfer safely, minor use of hands Standing Unsupported with Eyes Closed: Able to stand 10 seconds safely Standing Ubsupported with Feet Together: Needs help to attain position and unable to hold for 15 seconds (due to decr understanding, even with visual demo) From Standing, Reach Forward with Outstretched Arm: Can reach forward >12 cm safely (5") From Standing Position, Pick up Object from Floor: Able to pick up shoe safely and easily From Standing Position, Turn to Look Behind Over each Shoulder: Looks behind one side only/other side shows less weight shift Turn 360 Degrees: Able to turn 360 degrees safely in 4 seconds or less Standing Unsupported, Alternately Place Feet on Step/Stool: Able to complete >2 steps/needs minimal assist Standing Unsupported, One Foot in Front: Able to take small step independently and hold 30 seconds Standing on One Leg: Tries to lift leg/unable to hold 3 seconds but remains standing independently Total Score: 42         Pertinent Vitals/Pain Pain Assessment: No/denies pain    Home Living Family/patient expects to be discharged to:: Private residence Living Arrangements: Spouse/significant other Available Help at Discharge: Family Type of Home: House Home Access: Ramped entrance     Home Layout:  One level Home Equipment: [place] - 2 wheels;Bedside commode;Cane - single point      Prior Function Level of Independence: Independent               Hand Dominance   Dominant Hand: Right    Extremity/Trunk Assessment   Upper Extremity Assessment: Defer to OT evaluation           Lower Extremity Assessment: Overall WFL for tasks  assessed      Cervical / Trunk Assessment: Normal  Communication   Communication: Receptive difficulties;Expressive difficulties  Cognition Arousal/Alertness: Awake/alert Behavior During Therapy: WFL for tasks assessed/performed Overall Cognitive Status: Difficult to assess                      General Comments General comments (skin integrity, edema, etc.): wife present throughout    Exercises        Assessment/Plan    PT Assessment Patient needs continued PT services  PT Diagnosis Difficulty walking   PT Problem List Decreased balance;Decreased mobility;Decreased cognition  PT Treatment Interventions DME instruction;Gait training;Stair training;Functional mobility training;Therapeutic activities;Balance training;Cognitive remediation;Patient/family education   PT Goals (Current goals can be found in the Care Plan section) Acute Rehab PT Goals Patient Stated Goal: unable to state due to decr communication PT Goal Formulation: With patient/family Time For Goal Achievement: 03/06/14 Potential to Achieve Goals: Good    Frequency Min 4X/week   Barriers to discharge        Co-evaluation               End of Session Equipment Utilized During Treatment: Gait belt Activity Tolerance: Patient tolerated treatment well Patient left: in chair;with call bell/phone within reach;with chair alarm set;with family/visitor present           Time: 0942-1006 PT Time Calculation (min) (ACUTE ONLY): 24 min   Charges:   PT Evaluation $Initial PT Evaluation Tier I: 1 Procedure PT Treatments $Gait Training: 8-22 mins   PT G Codes:          Kaisley Stiverson 18-Mar-2014, 10:20 AM Pager [phone]

## 2014-02-27 NOTE — Progress Notes (Signed)
STROKE TEAM PROGRESS NOTE   HISTORY Peter Nicholson is an 73 y.o. male, right handed, with a past medical history significant for HTN, DM, hyperlipidemia, stroke many years ago without residual deficits, seizures (last seizure 2012), right renal cell carcinoma s/p nephrectomy, brought in due to acute onset of the aphasia. Patient stated that he was eating with his family around 2 pm today when he started having difficulty finding words out. However, his wife is at the bedside and contradicts his statement saying that he was doing well approximately 915 pm when the family noticed that he was having trouble speaking.' No reported HA, vertigo, double vision, difficulty swallowing, confusion, slurred speech, or vision impairment. Initial NIHSS 4. CT brain revealed no acute intracranial abnormality. Patient thinks his language is gradually improving.  Date last known well: 02/25/14 Time last known well: unclear. tPA Given: no, discordant information regarding time of onset. NIHSS: 4 (1 for language, 1 for vision, 2 for ataxia).   Patient was not administered TPA secondary to late arrival  He was admitted to the neuro floor bed for further evaluation and treatment.   SUBJECTIVE (INTERVAL HISTORY) His RN is at the bedside. He has remote h/o left CEA and is unaware of any restenosis. Overall he feels his condition is gradually improving.  CT angio shows likely occlusion of left petrous ICA but also poor flow in left ICA just distal to CEA site raising question of CEA occlusion distally versus primary stenosis at left petrous ICA   OBJECTIVE Temp:  [97.7 F (36.5 C)-98.6 F (37 C)] 97.7 F (36.5 C) (11/17 0947) Pulse Rate:  [91-100] 100 (11/17 0947) Cardiac Rhythm:  [-] Sinus tachycardia (11/17 0800) Resp:  [18-20] 18 (11/17 0947) BP: (118-167)/(65-80) 167/68 mmHg (11/17 0947) SpO2:  [97 %-99 %] 99 % (11/17 0947)   Recent Labs Lab 02/26/14 1132 02/26/14 1709 02/26/14 2110 02/27/14 0656  02/27/14 1203  GLUCAP 173* 99 166* 119* 191*    Recent Labs Lab 02/25/14 2310 02/25/14 2319 02/27/14 1315  NA 140 140 137  K 4.2 4.0 5.3  CL 101 103 103  CO2 24  --  24  GLUCOSE 162* 163* 189*  BUN 25* 27* 17  CREATININE 1.29 1.40* 1.15  CALCIUM 9.9  --  9.2    Recent Labs Lab 02/25/14 2310  AST 18  ALT 16  ALKPHOS 101  BILITOT 0.2*  PROT 7.0  ALBUMIN 3.5    Recent Labs Lab 02/25/14 2310 02/25/14 2319 02/27/14 1315  WBC 9.9  --  8.9  NEUTROABS 7.4  --   --   HGB 13.2 13.6 12.0*  HCT 39.5 40.0 36.2*  MCV 95.9  --  94.3  PLT 246  --  203    Recent Labs Lab 02/26/14 0416 02/26/14 0525 02/26/14 1221  TROPONINI <0.30 <0.30 <0.30    Recent Labs  02/25/14 2310  LABPROT 13.4  INR 1.01    Recent Labs  02/26/14 0703  COLORURINE YELLOW  LABSPEC 1.016  PHURINE 5.5  GLUCOSEU 250*  HGBUR NEGATIVE  BILIRUBINUR NEGATIVE  KETONESUR NEGATIVE  PROTEINUR 30*  UROBILINOGEN 0.2  NITRITE NEGATIVE  LEUKOCYTESUR SMALL*       Component Value Date/Time   CHOL 124 02/27/2014 0613   TRIG 118 02/27/2014 0613   HDL 40 02/27/2014 0613   CHOLHDL 3.1 02/27/2014 0613   VLDL 24 02/27/2014 0613   LDLCALC 60 02/27/2014 0613   Lab Results  Component Value Date   HGBA1C 9.0* 02/26/2014  Component Value Date/Time   LABOPIA NONE DETECTED 02/26/2014 0703   COCAINSCRNUR NONE DETECTED 02/26/2014 0703   LABBENZ POSITIVE* 02/26/2014 0703   AMPHETMU NONE DETECTED 02/26/2014 0703   THCU NONE DETECTED 02/26/2014 0703   LABBARB NONE DETECTED 02/26/2014 0703     Recent Labs Lab 02/25/14 2310  ETH <11    Ct Angio Head W/cm &/or Wo Cm  02/26/2014   EXAM: CT ANGIOGRAPHY HEAD AND NECK  TECHNIQUE: Multidetector CT imaging of the head and neck was performed using the standard protocol during bolus administration of intravenous contrast. Multiplanar CT image reconstructions and MIPs were obtained to evaluate the vascular anatomy. Carotid stenosis measurements (when  applicable) are obtained utilizing NASCET criteria, using the distal internal carotid diameter as the denominator.  CONTRAST:  56mL OMNIPAQUE IOHEXOL 350 MG/ML SOLN  COMPARISON:  Prior MRI from earlier the same day as well as prior CTA from 03/12/2011.  FINDINGS: CTA HEAD FINDINGS  ANTERIOR CIRCULATION:  The petrous and cavernous left ICA are occluded. There is reconstitution of the supra clinoid left ICA, likely via collateral flow cross the circle of Willis and VA the posterior circulation via the left posterior communicating artery. Petrous, cavernous, and supra clinoid right ICA are well opacified without hemodynamically significant stenosis. On the right, petrous right ICA is well opacified. There is calcified plaque within the cavernous right ICA with associated stenosis of up to 70% by NASCET criteria. Supra clinoid right ICA is well opacified.  Right A1 segment well opacified. The left A1 segment is hypoplastic. Anterior communicating artery grossly normal. Anterior cerebral arteries well opacified.  Multi focal atherosclerotic irregularity present within the left M1 segment without hemodynamically significant stenosis or occlusion. Right M1 segment is well opacified. Distal MCA branches symmetric bilaterally.  POSTERIOR CIRCULATION:  Heavy atheromatous plaque present within the distal aspects of the vertebral arteries bilaterally. There is occlusion of the right vertebral artery just distal to the takeoff of the right posterior inferior cerebral artery. Heavy atherosclerotic plaque within the left V4 segment is grossly stable relative to prior. Atherosclerotic irregularity seen at the vertebrobasilar junction. The basilar artery itself is widely patent without hemodynamically significant stenosis. Anterior inferior cerebral arteries and superior cerebral arteries are patent bilaterally.  There is atherosclerotic irregularity at the origin of the posterior cerebral arteries bilaterally with moderate stenosis  of both P1 segments. Flow within the right PCA is attenuated, similar to prior. Posterior communicating arteries are diminutive or absent.  CTA NECK FINDINGS  Visualized aortic arch is of normal caliber with normal 3 vessel morphology. No high-grade stenosis seen at the origin of the great vessels.  Prominent atheromatous plaque present at the origin of the right subclavian artery from the right brachiocephalic artery. Prominent calcified and noncalcified plaque present within the proximal left subclavian artery is well. No associated high-grade stenosis. Subclavian arteries well opacified distally.  Common carotid arteries are well opacified bilaterally without hemodynamically significant stenosis or occlusion.  Calcified and noncalcified plaque present about the carotid bifurcations bilaterally, left greater than right.  Right internal carotid artery is well opacified to the level of the skullbase without hemodynamically significant stenosis, occlusion, or dissection. Calcified plaque within the proximal right internal carotid artery resultant short-segment stenosis of approximately 30% by NASCET criteria.  On the left, sequela of prior carotid endarterectomy seen within the proximal left ICA. Just distal to the endarterectomy site, flow within the left ICA is markedly attenuated up to the level of the skullbase. There is absent flow within the  petrous segment.  External carotid arteries and their branches are within normal limits.  Both vertebral arteries arise from the subclavian arteries. Left vertebral artery is dominant. Vertebral arteries well opacified up to the level of the skullbase without dissection or hemodynamically significant stenosis.  Thyroid gland within normal limits.  No adenopathy.  Partially visualized lung apices are clear.  No acute osseous abnormality. No worrisome lytic or blastic osseous lesions.  IMPRESSION: CTA NECK IMPRESSION:  1. Prior left carotid endarterectomy with markedly  attenuated flow within the left ICA distal to the endarterectomy, which then becomes completely occluded at the level of the skullbase/petrous segment of the left ICA. 2. Relatively mild right carotid atherosclerosis without hemodynamically significant stenosis in the neck. 3. Diminutive right vertebral artery, patent within the neck, but occludes at the skullbase just beyond the origin of the right PICA. 4. Dominant left vertebral artery, patent within the neck.  CTA HEAD IMPRESSION:  1. Occlusion of the petrous and cavernous left ICA. There is some reconstitution of the distal supra clinoid left ICA likely via collateral flow across the circle of Willis or via flow from the posterior circulation via the posterior communicating arteries. 2. Moderate to severe atheromatous stenosis within the cavernous right ICA. 3. Severe distal vertebral artery atherosclerosis. The distal right vertebral artery is occluded. There is chronic high-grade stenosis of the distal left vertebral artery. These changes are similar to prior studies. 4. Moderate atheromatous stenosis of the proximal posterior cerebral arteries bilaterally.   Electronically Signed   By: Jeannine Boga [name]   On: 02/26/2014 23:52   Ct Head Wo Contrast  02/25/2014   CLINICAL DATA:  Code stroke. Trouble finding words at 1400 hr today.  EXAM: CT HEAD WITHOUT CONTRAST  TECHNIQUE: Contiguous axial images were obtained from the base of the skull through the vertex without intravenous contrast.  COMPARISON:  CT angio head 03/12/2011.  MRI brain 07/21/2012.  FINDINGS: Ventricles and sulci appear symmetrical. Probable old cerebellar infarcts. Patchy low-attenuation changes in the deep white matter consistent with small vessel ischemia. No mass effect or midline shift. No abnormal extra-axial fluid collections. Gray-white matter junctions are distinct. Basal cisterns are not effaced. No evidence of acute intracranial hemorrhage. No depressed skull fractures.  Visualized paranasal sinuses and mastoid air cells are not opacified.  IMPRESSION: No acute intracranial abnormalities. Chronic small vessel ischemic changes and probable old cerebellar infarcts.  These results were called by telephone at the time of interpretation on 02/25/2014 at 11:29 pm to Dr. Jola Schmidt , who verbally acknowledged these results.   Electronically Signed   By: Lucienne Capers [name]   On: 02/25/2014 23:30   Ct Angio Neck W/cm &/or Wo/cm  02/26/2014   EXAM: CT ANGIOGRAPHY HEAD AND NECK  TECHNIQUE: Multidetector CT imaging of the head and neck was performed using the standard protocol during bolus administration of intravenous contrast. Multiplanar CT image reconstructions and MIPs were obtained to evaluate the vascular anatomy. Carotid stenosis measurements (when applicable) are obtained utilizing NASCET criteria, using the distal internal carotid diameter as the denominator.  CONTRAST:  79mL OMNIPAQUE IOHEXOL 350 MG/ML SOLN  COMPARISON:  Prior MRI from earlier the same day as well as prior CTA from 03/12/2011.  FINDINGS: CTA HEAD FINDINGS  ANTERIOR CIRCULATION:  The petrous and cavernous left ICA are occluded. There is reconstitution of the supra clinoid left ICA, likely via collateral flow cross the circle of Willis and VA the posterior circulation via the left posterior communicating artery. Petrous, cavernous,  and supra clinoid right ICA are well opacified without hemodynamically significant stenosis. On the right, petrous right ICA is well opacified. There is calcified plaque within the cavernous right ICA with associated stenosis of up to 70% by NASCET criteria. Supra clinoid right ICA is well opacified.  Right A1 segment well opacified. The left A1 segment is hypoplastic. Anterior communicating artery grossly normal. Anterior cerebral arteries well opacified.  Multi focal atherosclerotic irregularity present within the left M1 segment without hemodynamically significant stenosis or  occlusion. Right M1 segment is well opacified. Distal MCA branches symmetric bilaterally.  POSTERIOR CIRCULATION:  Heavy atheromatous plaque present within the distal aspects of the vertebral arteries bilaterally. There is occlusion of the right vertebral artery just distal to the takeoff of the right posterior inferior cerebral artery. Heavy atherosclerotic plaque within the left V4 segment is grossly stable relative to prior. Atherosclerotic irregularity seen at the vertebrobasilar junction. The basilar artery itself is widely patent without hemodynamically significant stenosis. Anterior inferior cerebral arteries and superior cerebral arteries are patent bilaterally.  There is atherosclerotic irregularity at the origin of the posterior cerebral arteries bilaterally with moderate stenosis of both P1 segments. Flow within the right PCA is attenuated, similar to prior. Posterior communicating arteries are diminutive or absent.  CTA NECK FINDINGS  Visualized aortic arch is of normal caliber with normal 3 vessel morphology. No high-grade stenosis seen at the origin of the great vessels.  Prominent atheromatous plaque present at the origin of the right subclavian artery from the right brachiocephalic artery. Prominent calcified and noncalcified plaque present within the proximal left subclavian artery is well. No associated high-grade stenosis. Subclavian arteries well opacified distally.  Common carotid arteries are well opacified bilaterally without hemodynamically significant stenosis or occlusion.  Calcified and noncalcified plaque present about the carotid bifurcations bilaterally, left greater than right.  Right internal carotid artery is well opacified to the level of the skullbase without hemodynamically significant stenosis, occlusion, or dissection. Calcified plaque within the proximal right internal carotid artery resultant short-segment stenosis of approximately 30% by NASCET criteria.  On the left, sequela  of prior carotid endarterectomy seen within the proximal left ICA. Just distal to the endarterectomy site, flow within the left ICA is markedly attenuated up to the level of the skullbase. There is absent flow within the petrous segment.  External carotid arteries and their branches are within normal limits.  Both vertebral arteries arise from the subclavian arteries. Left vertebral artery is dominant. Vertebral arteries well opacified up to the level of the skullbase without dissection or hemodynamically significant stenosis.  Thyroid gland within normal limits.  No adenopathy.  Partially visualized lung apices are clear.  No acute osseous abnormality. No worrisome lytic or blastic osseous lesions.  IMPRESSION: CTA NECK IMPRESSION:  1. Prior left carotid endarterectomy with markedly attenuated flow within the left ICA distal to the endarterectomy, which then becomes completely occluded at the level of the skullbase/petrous segment of the left ICA. 2. Relatively mild right carotid atherosclerosis without hemodynamically significant stenosis in the neck. 3. Diminutive right vertebral artery, patent within the neck, but occludes at the skullbase just beyond the origin of the right PICA. 4. Dominant left vertebral artery, patent within the neck.  CTA HEAD IMPRESSION:  1. Occlusion of the petrous and cavernous left ICA. There is some reconstitution of the distal supra clinoid left ICA likely via collateral flow across the circle of Willis or via flow from the posterior circulation via the posterior communicating arteries. 2. Moderate to  severe atheromatous stenosis within the cavernous right ICA. 3. Severe distal vertebral artery atherosclerosis. The distal right vertebral artery is occluded. There is chronic high-grade stenosis of the distal left vertebral artery. These changes are similar to prior studies. 4. Moderate atheromatous stenosis of the proximal posterior cerebral arteries bilaterally.   Electronically  Signed   By: Jeannine Boga [name]   On: 02/26/2014 23:52   [name] Wo Contrast  02/26/2014   ADDENDUM REPORT: 02/26/2014 11:21  ADDENDUM: These results were called by telephone at the time of interpretation on 02/26/2014 at 11:00 am to Dr. Eliseo Squires , who verbally acknowledged these results.   Electronically Signed   By: Chauncey Cruel [name]   On: 02/26/2014 11:21   02/26/2014   CLINICAL DATA:  73 year old diabetic hypertensive male presenting with dysarthria. Subsequent encounter.  EXAM: MRI HEAD WITHOUT CONTRAST  MRA HEAD WITHOUT CONTRAST  TECHNIQUE: Multiplanar, multiecho pulse sequences of the brain and surrounding structures were obtained without intravenous contrast. Angiographic images of the head were obtained using MRA technique without contrast.  COMPARISON:  02/25/2014 CT.  07/21/2012 MR.  FINDINGS: MRI HEAD FINDINGS  Acute small nonhemorrhagic infarcts spread throughout the left frontal lobe (parasagittal distribution), left temporal lobe and at the left temporal-parietal lobe junction (periatrial region).  Remote bilateral cerebellar infarcts more notable on the right. Remote tiny right thalamic infarcts. Mild small vessel disease type changes.  No hydrocephalus.  No intracranial hemorrhage.  No intracranial mass lesion noted on this unenhanced exam.  Mild cervical spondylotic changes upper cervical spine.  Abnormal appearance of the left internal carotid artery and right vertebral artery. Please see below.  MRA HEAD FINDINGS  Exam is motion degraded.  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 Reconstitution of flow left supraclinoid region appears inadequate as only a small amount of flow seen at the left carotid terminus and portions of the left middle cerebral artery. Findings new from the prior exam.  Moderate to marked stenosis right internal carotid artery cavernous segment.  A2 segment of the anterior cerebral arteries supplied bilaterally from the right.  Moderate narrowing proximal A1  segment left anterior cerebral artery.  Vessel at the anterior communicating artery region appears to be branch rather than aneurysm.  Occluded right vertebral artery and right posterior inferior cerebral artery.  High-grade multiple stenosis of the distal left vertebral artery.  Moderate narrowing proximal basilar artery.  Moderate stenosis portions of the P1 segment of the posterior cerebral artery more notable on the right. Nonvisualized distal branches of the right posterior cerebral artery.  IMPRESSION: MRI HEAD  Acute small nonhemorrhagic infarcts spread throughout the left frontal lobe (parasagittal distribution), left temporal lobe and at the left temporal-parietal lobe junction (periatrial region).  Remote bilateral cerebellar infarcts more notable on the right. Remote tiny right thalamic infarcts. Mild small vessel disease type changes.  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 Reconstitution of flow left supraclinoid region appears inadequate as only a small amount of flow seen at the left carotid terminus and portions of the left middle cerebral artery. Findings new from the prior exam.  Moderate to marked stenosis right internal carotid artery cavernous segment.  A2 segment of the anterior cerebral arteries supplied bilaterally from the right.  Moderate narrowing proximal A1 segment left anterior cerebral artery.  Occluded right vertebral artery and right posterior inferior cerebral artery. This is noted previously.  High-grade multiple stenosis of the distal left vertebral artery. This is noted previously.  Moderate narrowing proximal basilar  artery. This is noted previously.  Moderate stenosis portions of the P1 segment of the posterior cerebral artery more notable on the right. Nonvisualized distal branches of the right posterior cerebral artery.  These results will be called to the ordering clinician or representative by the Radiologist Assistant, and communication documented in the PACS  or zVision Dashboard.  Electronically Signed: By: Chauncey Cruel [name] On: 02/26/2014 08:49   Mr Brain Wo Contrast  02/26/2014   ADDENDUM REPORT: 02/26/2014 11:21  ADDENDUM: These results were called by telephone at the time of interpretation on 02/26/2014 at 11:00 am to Dr. Eliseo Squires , who verbally acknowledged these results.   Electronically Signed   By: Chauncey Cruel [name]   On: 02/26/2014 11:21   02/26/2014   CLINICAL DATA:  73 year old diabetic hypertensive male presenting with dysarthria. Subsequent encounter.  EXAM: MRI HEAD WITHOUT CONTRAST  MRA HEAD WITHOUT CONTRAST  TECHNIQUE: Multiplanar, multiecho pulse sequences of the brain and surrounding structures were obtained without intravenous contrast. Angiographic images of the head were obtained using MRA technique without contrast.  COMPARISON:  02/25/2014 CT.  07/21/2012 MR.  FINDINGS: MRI HEAD FINDINGS  Acute small nonhemorrhagic infarcts spread throughout the left frontal lobe (parasagittal distribution), left temporal lobe and at the left temporal-parietal lobe junction (periatrial region).  Remote bilateral cerebellar infarcts more notable on the right. Remote tiny right thalamic infarcts. Mild small vessel disease type changes.  No hydrocephalus.  No intracranial hemorrhage.  No intracranial mass lesion noted on this unenhanced exam.  Mild cervical spondylotic changes upper cervical spine.  Abnormal appearance of the left internal carotid artery and right vertebral artery. Please see below.  MRA HEAD FINDINGS  Exam is motion degraded.  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 Reconstitution of flow left supraclinoid region appears inadequate as only a small amount of flow seen at the left carotid terminus and portions of the left middle cerebral artery. Findings new from the prior exam.  Moderate to marked stenosis right internal carotid artery cavernous segment.  A2 segment of the anterior cerebral arteries supplied bilaterally from the right.  Moderate  narrowing proximal A1 segment left anterior cerebral artery.  Vessel at the anterior communicating artery region appears to be branch rather than aneurysm.  Occluded right vertebral artery and right posterior inferior cerebral artery.  High-grade multiple stenosis of the distal left vertebral artery.  Moderate narrowing proximal basilar artery.  Moderate stenosis portions of the P1 segment of the posterior cerebral artery more notable on the right. Nonvisualized distal branches of the right posterior cerebral artery.  IMPRESSION: MRI HEAD  Acute small nonhemorrhagic infarcts spread throughout the left frontal lobe (parasagittal distribution), left temporal lobe and at the left temporal-parietal lobe junction (periatrial region).  Remote bilateral cerebellar infarcts more notable on the right. Remote tiny right thalamic infarcts. Mild small vessel disease type changes.  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 Reconstitution of flow left supraclinoid region appears inadequate as only a small amount of flow seen at the left carotid terminus and portions of the left middle cerebral artery. Findings new from the prior exam.  Moderate to marked stenosis right internal carotid artery cavernous segment.  A2 segment of the anterior cerebral arteries supplied bilaterally from the right.  Moderate narrowing proximal A1 segment left anterior cerebral artery.  Occluded right vertebral artery and right posterior inferior cerebral artery. This is noted previously.  High-grade multiple stenosis of the distal left vertebral artery. This is noted previously.  Moderate narrowing proximal  basilar artery. This is noted previously.  Moderate stenosis portions of the P1 segment of the posterior cerebral artery more notable on the right. Nonvisualized distal branches of the right posterior cerebral artery.  These results will be called to the ordering clinician or representative by the Radiologist Assistant, and communication  documented in the PACS or zVision Dashboard.  Electronically Signed: By: Chauncey Cruel [name] On: 02/26/2014 08:49     PHYSICAL EXAM Frail middle aged Caucasian male not in distress.Awake alert. Afebrile. Head is nontraumatic. Neck is supple without bruit. Hearing is normal. Cardiac exam no murmur or gallop. Lungs are clear to auscultation. Distal pulses are well moderate  Neurological Exam : Awake alert oriented x 3. Moderate expressive aphasia . Impaired naming, repitition. Mild right lower face asymmetry. Tongue midline. No drift. Mild diminished fine finger movements on right. Orbits left over right upper extremity. Mild right grip weak.. Normal sensation . Normal coordination.gait deferred ASSESSMENT/PLAN Peter Nicholson is a 73 y.o. 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 suboptimal reconstitution of supraclinoid LICA Carotid Doppler  Findings suggest 1-39% right internal carotid artery stenosis. The left internal carotid artery waveform is atypical, suggestive of a possible distal obstruction. Vertebral arteries are patent with antegrade flow.    2D Echo   Left ventricle: The cavity size was normal. There was mild focal basal hypertrophy of the septum. Systolic function was at the lower limits of normal. The estimated ejection fraction was in the range of 50% to 55%.  LDL 52 mg%  HgbA1c 9.0  SCDs for VTE prophylaxis  Diet NPO time specified     aspirin 325 mg orally every day prior to admission, now on clopidogrel 75 mg orally every  day  Patient counseled to be compliant with his antithrombotic medications  Ongoing aggressive risk factor management  Therapy recommendations:  pending  Disposition:  pending  Hypertension  Home meds:  Will resume   stable  Patient counseled to be compliant with his blood pressure medications  Hyperlipidemia  Home meds:    resumed in hospital  LDL 52, goal < 70  Continue statin at discharge  Diabetes  HgbA1c 9 goal < 7.0  Uncontrolled  Other Stroke Risk Factors  Advanced age  Obesity, Body mass index is 30.23 kg/(m^2).   No Hx stroke/TIA  Family hx stroke  Obstructive sleep apnea, on CPAP at home    Other Pertinent History I have personally examined this patient, reviewed notes, independently viewed imaging studies, participated in medical decision making and plan of care. I have made any additions or clarifications directly to the above note. Agree with note above. Check cerebral catheter angio brain and neck to confirm LICA occlusion versus string sign. Continue Aspirin 81 mg daily to plavix for 3 months then plavix alone. D/W Dr  Estanislado Pandy and patient  Antony Contras, MD Medical Director [facility] Pager: [phone] 02/27/2014 2:28 PM   Hospital day # 2    To contact Stroke Continuity provider, please refer to [email or web address] After hours, contact General Neurology

## 2014-02-27 NOTE — Sedation Documentation (Signed)
Patient denies pain and is resting comfortably.  

## 2014-02-27 NOTE — Consult Note (Signed)
Chief Complaint: Chief Complaint  Patient presents with  . Code Stroke  [place] CVA 02/26/14  Referring Physician(s): Dr Leonie Man  History of Present Illness: Peter Nicholson is a 73 y.o. male  Hx renal cancer Pt has had CVA 2001---resolved almost completely without residual Suffered [place] CVA yesterday Now better speech (on ASA/Plavix) Speech is deliberate and slow but accurate Dr Leonie Man has asked for IR consult for cerebral arteriogram Dr Estanislado Pandy has reviewed imaging and chart I have seen and examined pt Now scheduled for cerebral arteriogram   Past Medical History  Diagnosis Date  . Obstructive sleep apnea (adult) (pediatric)     doesnt wear CPAP  . HTN (hypertension)   . Cerebrovascular disease, unspecified   . Unspecified venous (peripheral) insufficiency   . Pure hypercholesterolemia   . Type II or unspecified type diabetes mellitus without mention of complication, not stated as uncontrolled   . Overweight(278.02)   . Diaphragmatic hernia without mention of obstruction or gangrene   . Esophageal reflux   . Diverticulosis of colon (without mention of hemorrhage)   . Other specified congenital anomaly of kidney   . Kidney carcinoma   . Calculus of kidney   . Gout, unspecified   . Lumbago   . Anxiety state, unspecified   . History of gallstones   . IBS (irritable bowel syndrome)   . History of seizures   . Bowel obstruction   . Osteoarthrosis, unspecified whether generalized or localized, unspecified site     tendonitis  . Blood transfusion without reported diagnosis   . Stroke     2001  . Seizures     last seizure November 2012    Past Surgical History  Procedure Laterality Date  . Incisional hernia repair  1/96    Dr.Leone  . Nasal sinus surgery  12/96    Dr.Redman  . Cholecystectomy  9/04    Dr. Rebekah Chesterfield  . 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 Carotid endarterectomy  09/2000    Dr.Lawson  .  Veterbral art angioplasty      x2  . Small intestine surgery    . External fixation leg  03/10/2011    Procedure: EXTERNAL FIXATION LEG;  Surgeon: Rozanna Box;  Location: [place];  Service: Orthopedics;  Laterality: Right;  . Orif tibia plateau  03/24/2011    Procedure: OPEN REDUCTION INTERNAL FIXATION (ORIF) TIBIAL PLATEAU;  Surgeon: Rozanna Box;  Location: [place];  Service: Orthopedics;  Laterality: Right;    Allergies: Dilaudid and Fentanyl  Medications: Prior to Admission medications   Medication Sig Start Date End Date Taking? Authorizing Provider  allopurinol (ZYLOPRIM) 300 MG tablet TAKE 1 TABLET (300 MG TOTAL) BY MOUTH DAILY.   Yes Tammy S Parrett, NP  ALPRAZolam (XANAX) 0.5 MG tablet TAKE ONE-HALF TO ONE TABLET BY MOUTH THREE TIMES DAILY AS NEEDED FOR NERVES 11/01/13  Yes Noralee Space, MD  aspirin 325 MG EC tablet Take 325 mg by mouth daily.     Yes Historical Provider, MD  Blood Glucose Monitoring Suppl (ONE TOUCH ULTRA SYSTEM KIT) W/DEVICE KIT Test blood sugar up to 3 times daily 07/28/13  Yes Noralee Space, MD  clopidogrel (PLAVIX) 75 MG tablet TAKE 1 TABLET (75 MG TOTAL) BY MOUTH DAILY. 04/10/13  Yes Noralee Space, MD  glucose blood test strip Test blood sugar up to 3 times daily 07/28/13  Yes Noralee Space, MD  glyBURIDE (DIABETA) 5 MG tablet Take 5 mg by mouth 2 (two) times daily with a meal.   Yes Historical Provider, MD  Insulin Glargine (LANTUS SOLOSTAR) 100 UNIT/ML Solostar Pen Inject 10 Units into the skin at bedtime. 08/30/13  Yes Noralee Space, MD  Insulin Pen Needle 31G X 8 MM MISC Use with Lantus Solostar Pen once daily as directed 01/19/13  Yes Tammy S Parrett, NP  metFORMIN (GLUCOPHAGE) 500 MG tablet Take 2 tablets by mouth two times daily 06/15/12  Yes Noralee Space, MD  nortriptyline (PAMELOR) 25 MG capsule Take 25 mg by mouth at bedtime.   Yes Historical Provider, MD  pantoprazole (PROTONIX) 40 MG tablet TAKE 1 TABLET (40 MG TOTAL) BY MOUTH DAILY AT 12 NOON.  04/10/13  Yes Noralee Space, MD  saxagliptin HCl (ONGLYZA) 5 MG TABS tablet Take 1 tablet (5 mg total) by mouth daily. 05/15/11  Yes Noralee Space, MD  sertraline (ZOLOFT) 50 MG tablet TAKE 1 TABLET BY MOUTH EVERY DAY AT BEDTIME 01/21/14  Yes Noralee Space, MD  simvastatin (ZOCOR) 40 MG tablet TAKE ONE TABLET BY MOUTH AT BEDTIME 05/24/13  Yes Noralee Space, MD  traMADol (ULTRAM) 50 MG tablet Take 50 mg by mouth every 6 (six) hours as needed for pain.  07/08/12  Yes Historical Provider, MD    Family History  Problem Relation Age of Onset  . Heart attack Father   . Dementia Mother   . Ovarian cancer Paternal Grandmother   . Breast cancer Sister   . Colon cancer Neg Hx   . Esophageal cancer Neg Hx   . Rectal cancer Neg Hx   . Stomach cancer Neg Hx     History   Social History  . Marital Status: Married    Spouse Name: Charlett Nose    Number of Children: 2  . Years of Education: N/A   Occupational History  . barber     still working   Social History Main Topics  . Smoking status: Never Smoker   . Smokeless tobacco: Never Used  . Alcohol Use: Yes     Comment: Very occasional use  . Drug Use: No  . Sexual Activity: No   Other Topics Concern  . None   Social History Narrative   Patient drinks 4 cups of a caffinated Drinks week.     Review of Systems: A 12 point ROS discussed and pertinent positives are indicated in the HPI above.  All other systems are negative.  Review of Systems  Constitutional: Positive for activity change. Negative for fever, appetite change and fatigue.  HENT: Negative for tinnitus and trouble swallowing.   Eyes: Negative for visual disturbance.  Respiratory: Negative for chest tightness and shortness of breath.   Cardiovascular: Negative for chest pain.  Gastrointestinal: Negative for abdominal pain and abdominal distention.  Genitourinary: Negative for difficulty urinating.  Musculoskeletal: Negative for back pain.  Neurological: Positive for seizures,  speech difficulty and weakness. Negative for dizziness, tremors, syncope, facial asymmetry, light-headedness, numbness and headaches.  Psychiatric/Behavioral: Negative for behavioral problems and confusion.     Vital Signs: BP 167/68 mmHg  Pulse 100  Temp(Src) 97.7 F (36.5 C) (Oral)  Resp 18  Ht '5\' 6"'  (1.676 m)  Wt 84.913 kg (187 lb 3.2 oz)  BMI 30.23 kg/m2  SpO2 99%  Physical Exam  Constitutional: He is oriented to person, place, and time. He appears well-nourished.  Cardiovascular: Normal rate, regular rhythm and normal heart sounds.  No murmur heard. Pulmonary/Chest: Breath sounds normal. He has no wheezes.  Abdominal: Soft. Bowel sounds are normal. He exhibits no distension.  Musculoskeletal: Normal range of motion.  Neurological: He is alert and oriented to person, place, and time.  Skin: Skin is warm and dry.  Psychiatric: He has a normal mood and affect. His behavior is normal. Judgment and thought content normal.  Pts speech is slow but accurate Alert/oriented to date; time; name; dob and reason for exam    Imaging: Ct Angio Head W/cm &/or Wo Cm  02/26/2014   EXAM: CT ANGIOGRAPHY HEAD AND NECK  TECHNIQUE: Multidetector CT imaging of the head and neck was performed using the standard protocol during bolus administration of intravenous contrast. Multiplanar CT image reconstructions and MIPs were obtained to evaluate the vascular anatomy. Carotid stenosis measurements (when applicable) are obtained utilizing NASCET criteria, using the distal internal carotid diameter as the denominator.  CONTRAST:  71m OMNIPAQUE IOHEXOL 350 MG/ML SOLN  COMPARISON:  Prior MRI from earlier the same day as well as prior CTA from 03/12/2011.  FINDINGS: CTA HEAD FINDINGS  ANTERIOR CIRCULATION:  The petrous and cavernous left ICA are occluded. There is reconstitution of the supra clinoid left ICA, likely via collateral flow cross the circle of Willis and VA the posterior circulation via the left  posterior communicating artery. Petrous, cavernous, and supra clinoid right ICA are well opacified without hemodynamically significant stenosis. On the right, petrous right ICA is well opacified. There is calcified plaque within the cavernous right ICA with associated stenosis of up to 70% by NASCET criteria. Supra clinoid right ICA is well opacified.  Right A1 segment well opacified. The left A1 segment is hypoplastic. Anterior communicating artery grossly normal. Anterior cerebral arteries well opacified.  Multi focal atherosclerotic irregularity present within the left M1 segment without hemodynamically significant stenosis or occlusion. Right M1 segment is well opacified. Distal MCA branches symmetric bilaterally.  POSTERIOR CIRCULATION:  Heavy atheromatous plaque present within the distal aspects of the vertebral arteries bilaterally. There is occlusion of the right vertebral artery just distal to the takeoff of the right posterior inferior cerebral artery. Heavy atherosclerotic plaque within the left V4 segment is grossly stable relative to prior. Atherosclerotic irregularity seen at the vertebrobasilar junction. The basilar artery itself is widely patent without hemodynamically significant stenosis. Anterior inferior cerebral arteries and superior cerebral arteries are patent bilaterally.  There is atherosclerotic irregularity at the origin of the posterior cerebral arteries bilaterally with moderate stenosis of both P1 segments. Flow within the right PCA is attenuated, similar to prior. Posterior communicating arteries are diminutive or absent.  CTA NECK FINDINGS  Visualized aortic arch is of normal caliber with normal 3 vessel morphology. No high-grade stenosis seen at the origin of the great vessels.  Prominent atheromatous plaque present at the origin of the right subclavian artery from the right brachiocephalic artery. Prominent calcified and noncalcified plaque present within the proximal left  subclavian artery is well. No associated high-grade stenosis. Subclavian arteries well opacified distally.  Common carotid arteries are well opacified bilaterally without hemodynamically significant stenosis or occlusion.  Calcified and noncalcified plaque present about the carotid bifurcations bilaterally, left greater than right.  Right internal carotid artery is well opacified to the level of the skullbase without hemodynamically significant stenosis, occlusion, or dissection. Calcified plaque within the proximal right internal carotid artery resultant short-segment stenosis of approximately 30% by NASCET criteria.  On the left, sequela of prior carotid endarterectomy seen within the proximal left ICA.  Just distal to the endarterectomy site, flow within the left ICA is markedly attenuated up to the level of the skullbase. There is absent flow within the petrous segment.  External carotid arteries and their branches are within normal limits.  Both vertebral arteries arise from the subclavian arteries. Left vertebral artery is dominant. Vertebral arteries well opacified up to the level of the skullbase without dissection or hemodynamically significant stenosis.  Thyroid gland within normal limits.  No adenopathy.  Partially visualized lung apices are clear.  No acute osseous abnormality. No worrisome lytic or blastic osseous lesions.  IMPRESSION: CTA NECK IMPRESSION:  1. Prior left carotid endarterectomy with markedly attenuated flow within the left ICA distal to the endarterectomy, which then becomes completely occluded at the level of the skullbase/petrous segment of the left ICA. 2. Relatively mild right carotid atherosclerosis without hemodynamically significant stenosis in the neck. 3. Diminutive right vertebral artery, patent within the neck, but occludes at the skullbase just beyond the origin of the right PICA. 4. Dominant left vertebral artery, patent within the neck.  CTA HEAD IMPRESSION:  1. Occlusion of  the petrous and cavernous left ICA. There is some reconstitution of the distal supra clinoid left ICA likely via collateral flow across the circle of Willis or via flow from the posterior circulation via the posterior communicating arteries. 2. Moderate to severe atheromatous stenosis within the cavernous right ICA. 3. Severe distal vertebral artery atherosclerosis. The distal right vertebral artery is occluded. There is chronic high-grade stenosis of the distal left vertebral artery. These changes are similar to prior studies. 4. Moderate atheromatous stenosis of the proximal posterior cerebral arteries bilaterally.   Electronically Signed   By: Jeannine Boga [name]   On: 02/26/2014 23:52   Ct Head Wo Contrast  02/25/2014   CLINICAL DATA:  Code stroke. Trouble finding words at 1400 hr today.  EXAM: CT HEAD WITHOUT CONTRAST  TECHNIQUE: Contiguous axial images were obtained from the base of the skull through the vertex without intravenous contrast.  COMPARISON:  CT angio head 03/12/2011.  MRI brain 07/21/2012.  FINDINGS: Ventricles and sulci appear symmetrical. Probable old cerebellar infarcts. Patchy low-attenuation changes in the deep white matter consistent with small vessel ischemia. No mass effect or midline shift. No abnormal extra-axial fluid collections. Gray-white matter junctions are distinct. Basal cisterns are not effaced. No evidence of acute intracranial hemorrhage. No depressed skull fractures. Visualized paranasal sinuses and mastoid air cells are not opacified.  IMPRESSION: No acute intracranial abnormalities. Chronic small vessel ischemic changes and probable old cerebellar infarcts.  These results were called by telephone at the time of interpretation on 02/25/2014 at 11:29 pm to Dr. Jola Schmidt , who verbally acknowledged these results.   Electronically Signed   By: Lucienne Capers [name]   On: 02/25/2014 23:30   Ct Angio Neck W/cm &/or Wo/cm  02/26/2014   EXAM: CT ANGIOGRAPHY HEAD  AND NECK  TECHNIQUE: Multidetector CT imaging of the head and neck was performed using the standard protocol during bolus administration of intravenous contrast. Multiplanar CT image reconstructions and MIPs were obtained to evaluate the vascular anatomy. Carotid stenosis measurements (when applicable) are obtained utilizing NASCET criteria, using the distal internal carotid diameter as the denominator.  CONTRAST:  65m OMNIPAQUE IOHEXOL 350 MG/ML SOLN  COMPARISON:  Prior MRI from earlier the same day as well as prior CTA from 03/12/2011.  FINDINGS: CTA HEAD FINDINGS  ANTERIOR CIRCULATION:  The petrous and cavernous left ICA are occluded. There is reconstitution of  the supra clinoid left ICA, likely via collateral flow cross the circle of Willis and VA the posterior circulation via the left posterior communicating artery. Petrous, cavernous, and supra clinoid right ICA are well opacified without hemodynamically significant stenosis. On the right, petrous right ICA is well opacified. There is calcified plaque within the cavernous right ICA with associated stenosis of up to 70% by NASCET criteria. Supra clinoid right ICA is well opacified.  Right A1 segment well opacified. The left A1 segment is hypoplastic. Anterior communicating artery grossly normal. Anterior cerebral arteries well opacified.  Multi focal atherosclerotic irregularity present within the left M1 segment without hemodynamically significant stenosis or occlusion. Right M1 segment is well opacified. Distal MCA branches symmetric bilaterally.  POSTERIOR CIRCULATION:  Heavy atheromatous plaque present within the distal aspects of the vertebral arteries bilaterally. There is occlusion of the right vertebral artery just distal to the takeoff of the right posterior inferior cerebral artery. Heavy atherosclerotic plaque within the left V4 segment is grossly stable relative to prior. Atherosclerotic irregularity seen at the vertebrobasilar junction. The  basilar artery itself is widely patent without hemodynamically significant stenosis. Anterior inferior cerebral arteries and superior cerebral arteries are patent bilaterally.  There is atherosclerotic irregularity at the origin of the posterior cerebral arteries bilaterally with moderate stenosis of both P1 segments. Flow within the right PCA is attenuated, similar to prior. Posterior communicating arteries are diminutive or absent.  CTA NECK FINDINGS  Visualized aortic arch is of normal caliber with normal 3 vessel morphology. No high-grade stenosis seen at the origin of the great vessels.  Prominent atheromatous plaque present at the origin of the right subclavian artery from the right brachiocephalic artery. Prominent calcified and noncalcified plaque present within the proximal left subclavian artery is well. No associated high-grade stenosis. Subclavian arteries well opacified distally.  Common carotid arteries are well opacified bilaterally without hemodynamically significant stenosis or occlusion.  Calcified and noncalcified plaque present about the carotid bifurcations bilaterally, left greater than right.  Right internal carotid artery is well opacified to the level of the skullbase without hemodynamically significant stenosis, occlusion, or dissection. Calcified plaque within the proximal right internal carotid artery resultant short-segment stenosis of approximately 30% by NASCET criteria.  On the left, sequela of prior carotid endarterectomy seen within the proximal left ICA. Just distal to the endarterectomy site, flow within the left ICA is markedly attenuated up to the level of the skullbase. There is absent flow within the petrous segment.  External carotid arteries and their branches are within normal limits.  Both vertebral arteries arise from the subclavian arteries. Left vertebral artery is dominant. Vertebral arteries well opacified up to the level of the skullbase without dissection or  hemodynamically significant stenosis.  Thyroid gland within normal limits.  No adenopathy.  Partially visualized lung apices are clear.  No acute osseous abnormality. No worrisome lytic or blastic osseous lesions.  IMPRESSION: CTA NECK IMPRESSION:  1. Prior left carotid endarterectomy with markedly attenuated flow within the left ICA distal to the endarterectomy, which then becomes completely occluded at the level of the skullbase/petrous segment of the left ICA. 2. Relatively mild right carotid atherosclerosis without hemodynamically significant stenosis in the neck. 3. Diminutive right vertebral artery, patent within the neck, but occludes at the skullbase just beyond the origin of the right PICA. 4. Dominant left vertebral artery, patent within the neck.  CTA HEAD IMPRESSION:  1. Occlusion of the petrous and cavernous left ICA. There is some reconstitution of the distal supra  clinoid left ICA likely via collateral flow across the circle of Willis or via flow from the posterior circulation via the posterior communicating arteries. 2. Moderate to severe atheromatous stenosis within the cavernous right ICA. 3. Severe distal vertebral artery atherosclerosis. The distal right vertebral artery is occluded. There is chronic high-grade stenosis of the distal left vertebral artery. These changes are similar to prior studies. 4. Moderate atheromatous stenosis of the proximal posterior cerebral arteries bilaterally.   Electronically Signed   By: Jeannine Boga [name]   On: 02/26/2014 23:52   [name] Wo Contrast  02/26/2014   ADDENDUM REPORT: 02/26/2014 11:21  ADDENDUM: These results were called by telephone at the time of interpretation on 02/26/2014 at 11:00 am to Dr. Eliseo Squires , who verbally acknowledged these results.   Electronically Signed   By: Chauncey Cruel [name]   On: 02/26/2014 11:21   02/26/2014   CLINICAL DATA:  73 year old diabetic hypertensive male presenting with dysarthria. Subsequent encounter.  EXAM:  MRI HEAD WITHOUT CONTRAST  MRA HEAD WITHOUT CONTRAST  TECHNIQUE: Multiplanar, multiecho pulse sequences of the brain and surrounding structures were obtained without intravenous contrast. Angiographic images of the head were obtained using MRA technique without contrast.  COMPARISON:  02/25/2014 CT.  07/21/2012 MR.  FINDINGS: MRI HEAD FINDINGS  Acute small nonhemorrhagic infarcts spread throughout the left frontal lobe (parasagittal distribution), left temporal lobe and at the left temporal-parietal lobe junction (periatrial region).  Remote bilateral cerebellar infarcts more notable on the right. Remote tiny right thalamic infarcts. Mild small vessel disease type changes.  No hydrocephalus.  No intracranial hemorrhage.  No intracranial mass lesion noted on this unenhanced exam.  Mild cervical spondylotic changes upper cervical spine.  Abnormal appearance of the left internal carotid artery and right vertebral artery. Please see below.  MRA HEAD FINDINGS  Exam is motion degraded.  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 Reconstitution of flow left supraclinoid region appears inadequate as only a small amount of flow seen at the left carotid terminus and portions of the left middle cerebral artery. Findings new from the prior exam.  Moderate to marked stenosis right internal carotid artery cavernous segment.  A2 segment of the anterior cerebral arteries supplied bilaterally from the right.  Moderate narrowing proximal A1 segment left anterior cerebral artery.  Vessel at the anterior communicating artery region appears to be branch rather than aneurysm.  Occluded right vertebral artery and right posterior inferior cerebral artery.  High-grade multiple stenosis of the distal left vertebral artery.  Moderate narrowing proximal basilar artery.  Moderate stenosis portions of the P1 segment of the posterior cerebral artery more notable on the right. Nonvisualized distal branches of the right posterior cerebral artery.   IMPRESSION: MRI HEAD  Acute small nonhemorrhagic infarcts spread throughout the left frontal lobe (parasagittal distribution), left temporal lobe and at the left temporal-parietal lobe junction (periatrial region).  Remote bilateral cerebellar infarcts more notable on the right. Remote tiny right thalamic infarcts. Mild small vessel disease type changes.  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 Reconstitution of flow left supraclinoid region appears inadequate as only a small amount of flow seen at the left carotid terminus and portions of the left middle cerebral artery. Findings new from the prior exam.  Moderate to marked stenosis right internal carotid artery cavernous segment.  A2 segment of the anterior cerebral arteries supplied bilaterally from the right.  Moderate narrowing proximal A1 segment left anterior cerebral artery.  Occluded right vertebral artery and right  posterior inferior cerebral artery. This is noted previously.  High-grade multiple stenosis of the distal left vertebral artery. This is noted previously.  Moderate narrowing proximal basilar artery. This is noted previously.  Moderate stenosis portions of the P1 segment of the posterior cerebral artery more notable on the right. Nonvisualized distal branches of the right posterior cerebral artery.  These results will be called to the ordering clinician or representative by the Radiologist Assistant, and communication documented in the PACS or zVision Dashboard.  Electronically Signed: By: Chauncey Cruel [name] On: 02/26/2014 08:49   Mr Brain Wo Contrast  02/26/2014   ADDENDUM REPORT: 02/26/2014 11:21  ADDENDUM: These results were called by telephone at the time of interpretation on 02/26/2014 at 11:00 am to Dr. Eliseo Squires , who verbally acknowledged these results.   Electronically Signed   By: Chauncey Cruel [name]   On: 02/26/2014 11:21   02/26/2014   CLINICAL DATA:  73 year old diabetic hypertensive male presenting with dysarthria.  Subsequent encounter.  EXAM: MRI HEAD WITHOUT CONTRAST  MRA HEAD WITHOUT CONTRAST  TECHNIQUE: Multiplanar, multiecho pulse sequences of the brain and surrounding structures were obtained without intravenous contrast. Angiographic images of the head were obtained using MRA technique without contrast.  COMPARISON:  02/25/2014 CT.  07/21/2012 MR.  FINDINGS: MRI HEAD FINDINGS  Acute small nonhemorrhagic infarcts spread throughout the left frontal lobe (parasagittal distribution), left temporal lobe and at the left temporal-parietal lobe junction (periatrial region).  Remote bilateral cerebellar infarcts more notable on the right. Remote tiny right thalamic infarcts. Mild small vessel disease type changes.  No hydrocephalus.  No intracranial hemorrhage.  No intracranial mass lesion noted on this unenhanced exam.  Mild cervical spondylotic changes upper cervical spine.  Abnormal appearance of the left internal carotid artery and right vertebral artery. Please see below.  MRA HEAD FINDINGS  Exam is motion degraded.  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 Reconstitution of flow left supraclinoid region appears inadequate as only a small amount of flow seen at the left carotid terminus and portions of the left middle cerebral artery. Findings new from the prior exam.  Moderate to marked stenosis right internal carotid artery cavernous segment.  A2 segment of the anterior cerebral arteries supplied bilaterally from the right.  Moderate narrowing proximal A1 segment left anterior cerebral artery.  Vessel at the anterior communicating artery region appears to be branch rather than aneurysm.  Occluded right vertebral artery and right posterior inferior cerebral artery.  High-grade multiple stenosis of the distal left vertebral artery.  Moderate narrowing proximal basilar artery.  Moderate stenosis portions of the P1 segment of the posterior cerebral artery more notable on the right. Nonvisualized distal branches of the right  posterior cerebral artery.  IMPRESSION: MRI HEAD  Acute small nonhemorrhagic infarcts spread throughout the left frontal lobe (parasagittal distribution), left temporal lobe and at the left temporal-parietal lobe junction (periatrial region).  Remote bilateral cerebellar infarcts more notable on the right. Remote tiny right thalamic infarcts. Mild small vessel disease type changes.  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 Reconstitution of flow left supraclinoid region appears inadequate as only a small amount of flow seen at the left carotid terminus and portions of the left middle cerebral artery. Findings new from the prior exam.  Moderate to marked stenosis right internal carotid artery cavernous segment.  A2 segment of the anterior cerebral arteries supplied bilaterally from the right.  Moderate narrowing proximal A1 segment left anterior cerebral artery.  Occluded right vertebral artery and  right posterior inferior cerebral artery. This is noted previously.  High-grade multiple stenosis of the distal left vertebral artery. This is noted previously.  Moderate narrowing proximal basilar artery. This is noted previously.  Moderate stenosis portions of the P1 segment of the posterior cerebral artery more notable on the right. Nonvisualized distal branches of the right posterior cerebral artery.  These results will be called to the ordering clinician or representative by the Radiologist Assistant, and communication documented in the PACS or zVision Dashboard.  Electronically Signed: By: Chauncey Cruel [name] On: 02/26/2014 08:49    Labs:  CBC:  Recent Labs  02/25/14 2310 02/25/14 2319 02/27/14 1315  WBC 9.9  --  8.9  HGB 13.2 13.6 12.0*  HCT 39.5 40.0 36.2*  PLT 246  --  203    COAGS:  Recent Labs  02/25/14 2310  INR 1.01  APTT 26    BMP:  Recent Labs  05/24/13 1230 11/21/13 1226 02/25/14 2310 02/25/14 2319  NA 143 139 140 140  K 5.0 5.1 4.2 4.0  CL 107 105 101 103  CO2  '29 25 24  ' --   GLUCOSE 89 167* 162* 163*  BUN 29* 24* 25* 27*  CALCIUM 9.4 9.9 9.9  --   CREATININE 1.3 1.2 1.29 1.40*  GFRNONAA  --   --  53*  --   GFRAA  --   --  61*  --     LIVER FUNCTION TESTS:  Recent Labs  02/25/14 2310  BILITOT 0.2*  AST 18  ALT 16  ALKPHOS 101  PROT 7.0  ALBUMIN 3.5    TUMOR MARKERS: No results for input(s): AFPTM, CEA, CA199, CHROMGRNA in the last 8760 hours.  Assessment and Plan:  Hx renal ca Hx CVA 2001- resolved without residual New L watershed CVA yesterday Speech is better today For cerebral arteriogram for evaluation of cerebral vessels Pt aware of procedure benefits and risks and agreeable to proceed   Thank you for this interesting consult.  I greatly enjoyed meeting Peter Nicholson and look forward to participating in their care.    I spent a total of 40 minutes face to face in clinical consultation, greater than 50% of which was counseling/coordinating care for cerebral arteriogram  Signed: TURPIN,PAMELA A 02/27/2014, 1:50 PM

## 2014-02-27 NOTE — Progress Notes (Signed)
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 PMHx- HTN, DM, OSA, Rt CVA '01 (with near resolution), kidney Ca   Clinical Impression   Patient evaluated by Occupational Therapy with no further acute OT needs identified. All education has been completed and the patient has no further questions. See below for any follow-up Occupational Therapy or equipment needs. OT to sign off. Thank you for referral.    Follow Up Recommendations  No OT follow up;Supervision - Intermittent    Equipment Recommendations  Other (comment) (Unsure of home equipment. Will need to confirm with wife)    Recommendations for Other Services       Precautions / Restrictions Precautions Precautions: Fall Restrictions Weight Bearing Restrictions: No      Mobility Bed Mobility Overal bed mobility: Modified Independent Bed Mobility: Supine to Sit;Sit to Supine     Supine to sit: Modified independent (Device/Increase time) Sit to supine: Modified independent (Device/Increase time)   General bed mobility comments: HOB flat; use of bedrails.   Transfers Overall transfer level: Needs assistance Equipment used: Rolling walker (2 wheeled) Transfers: Sit to/from Stand Sit to Stand: Supervision         General transfer comment: Supervision for safety/balance    Balance Overall balance assessment: Needs assistance Sitting-balance support: No upper extremity supported;Feet supported Sitting balance-Leahy Scale: Good     Standing balance support: No upper extremity supported;During functional activity Standing balance-Leahy Scale: Fair Standing balance comment: Pt able to maintain balance and ambulate in bathroom from sink to toilet without UE support.                 Standardized Balance  Assessment Standardized Balance Assessment : Berg Balance Test Berg Balance Test Sit to Stand: Able to stand without using hands and stabilize independently Standing Unsupported: Able to stand safely 2 minutes Sitting with Back Unsupported but Feet Supported on Floor or Stool: Able to sit safely and securely 2 minutes Stand to Sit: Sits safely with minimal use of hands Transfers: Able to transfer safely, minor use of hands Standing Unsupported with Eyes Closed: Able to stand 10 seconds safely Standing Ubsupported with Feet Together: Needs help to attain position and unable to hold for 15 seconds (due to decr understanding, even with visual demo) From Standing, Reach Forward with Outstretched Arm: Can reach forward >12 cm safely (5") From Standing Position, Pick up Object from Floor: Able to pick up shoe safely and easily From Standing Position, Turn to Look Behind Over each Shoulder: Looks behind one side only/other side shows less weight shift Turn 360 Degrees: Able to turn 360 degrees safely in 4 seconds or less Standing Unsupported, Alternately Place Feet on Step/Stool: Able to complete >2 steps/needs minimal assist Standing Unsupported, One Foot in Front: Able to take small step independently and hold 30 seconds Standing on One Leg: Tries to lift leg/unable to hold 3 seconds but remains standing independently Total Score: 42        ADL Overall ADL's : Needs assistance/impaired     Grooming: Wash/dry hands;Wash/dry face;Oral care;Applying deodorant;Supervision/safety;Standing   Upper Body Bathing: Supervision/ safety;Standing   Lower Body Bathing: Supervison/ safety;Sit to/from stand   Upper Body Dressing : Set up;Sitting       Toilet Transfer: Supervision/safety;Ambulation;Regular Toilet;Grab bars   Toileting- Clothing Manipulation and Hygiene: Supervision/safety;Sit to/from stand  Functional mobility during ADLs: Supervision/safety;Rolling walker General ADL  Comments: Pt completed all ADLs and transfers at supervision level with no LOB or reports of dizziness. Pt appeared anxious during session and demostrated decr safety awareness.      Vision Eye Alignment: Within Functional Limits Alignment/Gaze Preference: Within Defined Limits Ocular Range of Motion: Within Functional Limits Tracking/Visual Pursuits: Able to track stimulus in all quads without difficulty   Convergence: Within functional limits     Additional Comments: Pt able to accurately read several sentences located in different visual quadrants.   Perception     Praxis      Pertinent Vitals/Pain Pain Assessment: No/denies pain     Hand Dominance Right   Extremity/Trunk Assessment Upper Extremity Assessment Upper Extremity Assessment: Overall WFL for tasks assessed   Lower Extremity Assessment Lower Extremity Assessment: Defer to PT evaluation   Cervical / Trunk Assessment Cervical / Trunk Assessment: Normal   Communication Communication Communication: Expressive difficulties;Receptive difficulties   Cognition Arousal/Alertness: Awake/alert Behavior During Therapy: WFL for tasks assessed/performed Overall Cognitive Status: Difficult to assess                     General Comments       Exercises       Shoulder Instructions      Home Living Family/patient expects to be discharged to:: Private residence Living Arrangements: Spouse/significant other Available Help at Discharge: Family Type of Home: House Home Access: Ramped entrance     Home Layout: One level     Bathroom Shower/Tub: Tub/shower unit;Walk-in shower Shower/tub characteristics:  (Unsure) Bathroom Toilet: Handicapped height     Home Equipment: Hand held shower head;Wheelchair - manual;Shower seat   Additional Comments: ? accuracy of information. Family not present to confirm. Pt states he has and uses both a walk-in shower & tub/shower. Pt became confused and was unable to  recall if the shower(s) have doors or curtain.       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

## 2014-02-27 NOTE — Sedation Documentation (Signed)
Physician performed neuro checks on the pt. It was WNL.

## 2014-02-27 NOTE — Plan of Care (Signed)
Problem: Acute Treatment Outcomes Goal: Airway maintained/protected Outcome: Completed/Met Date Met:  02/27/14     

## 2014-02-28 DIAGNOSIS — I63032 Cerebral infarction due to thrombosis of left carotid artery: Secondary | ICD-10-CM

## 2014-02-28 DIAGNOSIS — E08311 Diabetes mellitus due to underlying condition with unspecified diabetic retinopathy with macular edema: Secondary | ICD-10-CM

## 2014-02-28 LAB — CBC
HCT: 34.1 % — ABNORMAL LOW (ref 39.0–52.0)
Hemoglobin: 11.2 g/dL — ABNORMAL LOW (ref 13.0–17.0)
MCH: 31 pg (ref 26.0–34.0)
MCHC: 32.8 g/dL (ref 30.0–36.0)
MCV: 94.5 fL (ref 78.0–100.0)
Platelets: 183 10*3/uL (ref 150–400)
RBC: 3.61 MIL/uL — ABNORMAL LOW (ref 4.22–5.81)
RDW: 12 % (ref 11.5–15.5)
WBC: 6.4 10*3/uL (ref 4.0–10.5)

## 2014-02-28 LAB — GLUCOSE, CAPILLARY
GLUCOSE-CAPILLARY: 210 mg/dL — AB (ref 70–99)
GLUCOSE-CAPILLARY: 112 mg/dL — AB (ref 70–99)
Glucose-Capillary: 107 mg/dL — ABNORMAL HIGH (ref 70–99)

## 2014-02-28 LAB — BASIC METABOLIC PANEL
Anion gap: 9 (ref 5–15)
BUN: 14 mg/dL (ref 6–23)
CO2: 24 mEq/L (ref 19–32)
Calcium: 8.7 mg/dL (ref 8.4–10.5)
Chloride: 105 mEq/L (ref 96–112)
Creatinine, Ser: 1.17 mg/dL (ref 0.50–1.35)
GFR, EST AFRICAN AMERICAN: 69 mL/min — AB (ref >90)
GFR, EST NON AFRICAN AMERICAN: 60 mL/min — AB (ref >90)
Glucose, Bld: 125 mg/dL — ABNORMAL HIGH (ref 70–99)
POTASSIUM: 4.2 meq/L (ref 3.7–5.3)
Sodium: 138 mEq/L (ref 137–147)

## 2014-02-28 MED ORDER — LISINOPRIL 5 MG PO TABS: 5.0000 mg | ORAL_TABLET | Freq: Every day | ORAL | Status: DC

## 2014-02-28 MED ORDER — METFORMIN HCL 500 MG PO TABS: ORAL_TABLET | ORAL | Status: DC

## 2014-02-28 MED ORDER — ASPIRIN 81 MG PO TBEC: 81.0000 mg | DELAYED_RELEASE_TABLET | Freq: Every day | ORAL | Status: AC

## 2014-02-28 MED ORDER — CLOPIDOGREL BISULFATE 75 MG PO TABS: 75.0000 mg | ORAL_TABLET | Freq: Every day | ORAL | Status: DC

## 2014-02-28 NOTE — Plan of Care (Signed)
Problem: Acute Rehab PT Goals(only PT should resolve) Goal: Pt Will Go Supine/Side To Sit Outcome: Adequate for Discharge Goal: Pt Will Perform Standing Balance Or Pre-Gait Outcome: Completed/Met Date Met:  02/28/14 Goal: Pt Will Ambulate Outcome: Completed/Met Date Met:  02/28/14

## 2014-02-28 NOTE — Progress Notes (Signed)
Physical Therapy Treatment and Discharge Patient Details Name: Peter Nicholson MRN: [id number] DOB: 1940-11-22 Today's Date: 02/28/2014    History of Present Illness Adm 02/26/14 with Lt watershed infarcts due to occlusion of Lt ICA. PMHx- HTN, DM, OSA, Rt CVA '01 (with near resolution), kidney Ca    PT Comments    Pt much improved this date (language and balance/mobility). Berg Score incr to 49/56 (from 42 with 7 point incr indicative of a measurable functional improvement). Pt only requires assist with ambulation at this time due to IV pole. No further PT needs identified.    Follow Up Recommendations  No PT follow up;Supervision - Intermittent (may need intermittent assistance due to expressive difficult)     Equipment Recommendations  None recommended by PT    Recommendations for Other Services       Precautions / Restrictions Restrictions Weight Bearing Restrictions: No    Mobility  Bed Mobility                  Transfers Overall transfer level: Independent Equipment used: None   Sit to Stand: Independent            Ambulation/Gait Ambulation/Gait assistance: Supervision (due to IV) Ambulation Distance (Feet): 300 Feet Assistive device: None Gait Pattern/deviations: WFL(Within Functional Limits) Gait velocity: decr but able to incr with cues   General Gait Details: no drifting   Stairs Stairs: Yes Stairs assistance: Supervision Stair Management: No rails;Forwards;Step to pattern Number of Stairs: 3 General stair comments: with rail does alternating pattern; no rail he self-selected step to pattern  Wheelchair Mobility    Modified Rankin (Stroke Patients Only) Modified Rankin (Stroke Patients Only) Pre-Morbid Rankin Score: No symptoms Modified Rankin: Slight disability     Balance                                    Cognition Arousal/Alertness: Awake/alert Behavior During Therapy: WFL for tasks  assessed/performed Overall Cognitive Status: Within Functional Limits for tasks assessed                      Exercises      General Comments General comments (skin integrity, edema, etc.): Receptive language much better today with pt able to perform balance tasks with verbal commands and no visual demonstration      Pertinent Vitals/Pain      Home Living                      Prior Function            PT Goals (current goals can now be found in the care plan section) Acute Rehab PT Goals Patient Stated Goal: unable to state due to decr communication Progress towards PT goals: Goals met/education completed, patient discharged from PT    Frequency       PT Plan Discharge plan needs to be updated    Co-evaluation             End of Session Equipment Utilized During Treatment: Gait belt Activity Tolerance: Patient tolerated treatment well Patient left: in chair;with call bell/phone within reach;with chair alarm set;with family/visitor present     Time: 1015-1027 PT Time Calculation (min) (ACUTE ONLY): 12 min  Charges:  $Gait Training: 8-22 mins                    G Codes:  Peter Nicholson 02/28/2014, 10:38 AM  Pager [phone]

## 2014-02-28 NOTE — Progress Notes (Signed)
STROKE TEAM PROGRESS NOTE   HISTORY Peter Nicholson is an 73 y.o. male, right handed, with a past medical history significant for HTN, DM, hyperlipidemia, stroke many years ago without residual deficits, seizures (last seizure 2012), right renal cell carcinoma s/p nephrectomy, brought in due to acute onset of the aphasia. Patient stated that he was eating with his family around 2 pm today when he started having difficulty finding words out. However, his wife is at the bedside and contradicts his statement saying that he was doing well approximately 915 pm when the family noticed that he was having trouble speaking.' No reported HA, vertigo, double vision, difficulty swallowing, confusion, slurred speech, or vision impairment. Initial NIHSS 4. CT brain revealed no acute intracranial abnormality. Patient thinks his language is gradually improving.  Date last known well: 02/25/14 Time last known well: unclear. tPA Given: no, discordant information regarding time of onset. NIHSS: 4 (1 for language, 1 for vision, 2 for ataxia).   Patient was not administered TPA secondary to late arrival  He was admitted to the neuro floor bed for further evaluation and treatment.   SUBJECTIVE (INTERVAL HISTORY) His RN is at the bedside. He has remote h/o left CEA and is unaware of any restenosis. Overall he feels his condition is gradually improving. Cerebral catheter  angio shows severe preocclusive stenosis of the left proximal ICA with occlusion in the petrous section. 85% right ICA proximal cavernous segment stenosis. 80% stenosis of dominant left vertebral basilar junction proximal to the basilar artery. Occluded nondominant right vertebral basilar junction at C1.  OBJECTIVE Temp:  [97.8 F (36.6 C)-98.7 F (37.1 C)] 98 F (36.7 C) (11/18 1409) Pulse Rate:  [86-112] 98 (11/18 1409) Cardiac Rhythm:  [-] Sinus tachycardia (11/18 0800) Resp:  [16-24] 18 (11/18 1409) BP: (140-170)/(66-93) 167/66 mmHg (11/18  1409) SpO2:  [97 %-100 %] 100 % (11/18 1409)   Recent Labs Lab 02/27/14 1203 02/27/14 1700 02/27/14 2155 02/28/14 0644 02/28/14 1148  GLUCAP 191* 119* 196* 107* 210*    Recent Labs Lab 02/25/14 2310 02/25/14 2319 02/27/14 1315 02/28/14 0715  NA 140 140 137 138  K 4.2 4.0 5.3 4.2  CL 101 103 103 105  CO2 24  --  24 24  GLUCOSE 162* 163* 189* 125*  BUN 25* 27* 17 14  CREATININE 1.29 1.40* 1.15 1.17  CALCIUM 9.9  --  9.2 8.7    Recent Labs Lab 02/25/14 2310  AST 18  ALT 16  ALKPHOS 101  BILITOT 0.2*  PROT 7.0  ALBUMIN 3.5    Recent Labs Lab 02/25/14 2310 02/25/14 2319 02/27/14 1315 02/28/14 0715  WBC 9.9  --  8.9 6.4  NEUTROABS 7.4  --   --   --   HGB 13.2 13.6 12.0* 11.2*  HCT 39.5 40.0 36.2* 34.1*  MCV 95.9  --  94.3 94.5  PLT 246  --  203 183    Recent Labs Lab 02/26/14 0416 02/26/14 0525 02/26/14 1221  TROPONINI <0.30 <0.30 <0.30    Recent Labs  02/25/14 2310  LABPROT 13.4  INR 1.01    Recent Labs  02/26/14 0703  COLORURINE YELLOW  LABSPEC 1.016  PHURINE 5.5  GLUCOSEU 250*  HGBUR NEGATIVE  BILIRUBINUR NEGATIVE  KETONESUR NEGATIVE  PROTEINUR 30*  UROBILINOGEN 0.2  NITRITE NEGATIVE  LEUKOCYTESUR SMALL*       Component Value Date/Time   CHOL 124 02/27/2014 0613   TRIG 118 02/27/2014 0613   HDL 40 02/27/2014 2778  CHOLHDL 3.1 02/27/2014 0613   VLDL 24 02/27/2014 0613   LDLCALC 60 02/27/2014 0613   Lab Results  Component Value Date   HGBA1C 9.0* 02/26/2014      Component Value Date/Time   LABOPIA NONE DETECTED 02/26/2014 0703   COCAINSCRNUR NONE DETECTED 02/26/2014 0703   LABBENZ POSITIVE* 02/26/2014 0703   AMPHETMU NONE DETECTED 02/26/2014 0703   THCU NONE DETECTED 02/26/2014 0703   LABBARB NONE DETECTED 02/26/2014 0703     Recent Labs Lab 02/25/14 2310  ETH <11    Ct Angio Head W/cm &/or Wo Cm  02/26/2014   EXAM: CT ANGIOGRAPHY HEAD AND NECK  TECHNIQUE: Multidetector CT imaging of the head and neck was  performed using the standard protocol during bolus administration of intravenous contrast. Multiplanar CT image reconstructions and MIPs were obtained to evaluate the vascular anatomy. Carotid stenosis measurements (when applicable) are obtained utilizing NASCET criteria, using the distal internal carotid diameter as the denominator.  CONTRAST:  52mL OMNIPAQUE IOHEXOL 350 MG/ML SOLN  COMPARISON:  Prior MRI from earlier the same day as well as prior CTA from 03/12/2011.  FINDINGS: CTA HEAD FINDINGS  ANTERIOR CIRCULATION:  The petrous and cavernous left ICA are occluded. There is reconstitution of the supra clinoid left ICA, likely via collateral flow cross the circle of Willis and VA the posterior circulation via the left posterior communicating artery. Petrous, cavernous, and supra clinoid right ICA are well opacified without hemodynamically significant stenosis. On the right, petrous right ICA is well opacified. There is calcified plaque within the cavernous right ICA with associated stenosis of up to 70% by NASCET criteria. Supra clinoid right ICA is well opacified.  Right A1 segment well opacified. The left A1 segment is hypoplastic. Anterior communicating artery grossly normal. Anterior cerebral arteries well opacified.  Multi focal atherosclerotic irregularity present within the left M1 segment without hemodynamically significant stenosis or occlusion. Right M1 segment is well opacified. Distal MCA branches symmetric bilaterally.  POSTERIOR CIRCULATION:  Heavy atheromatous plaque present within the distal aspects of the vertebral arteries bilaterally. There is occlusion of the right vertebral artery just distal to the takeoff of the right posterior inferior cerebral artery. Heavy atherosclerotic plaque within the left V4 segment is grossly stable relative to prior. Atherosclerotic irregularity seen at the vertebrobasilar junction. The basilar artery itself is widely patent without hemodynamically significant  stenosis. Anterior inferior cerebral arteries and superior cerebral arteries are patent bilaterally.  There is atherosclerotic irregularity at the origin of the posterior cerebral arteries bilaterally with moderate stenosis of both P1 segments. Flow within the right PCA is attenuated, similar to prior. Posterior communicating arteries are diminutive or absent.  CTA NECK FINDINGS  Visualized aortic arch is of normal caliber with normal 3 vessel morphology. No high-grade stenosis seen at the origin of the great vessels.  Prominent atheromatous plaque present at the origin of the right subclavian artery from the right brachiocephalic artery. Prominent calcified and noncalcified plaque present within the proximal left subclavian artery is well. No associated high-grade stenosis. Subclavian arteries well opacified distally.  Common carotid arteries are well opacified bilaterally without hemodynamically significant stenosis or occlusion.  Calcified and noncalcified plaque present about the carotid bifurcations bilaterally, left greater than right.  Right internal carotid artery is well opacified to the level of the skullbase without hemodynamically significant stenosis, occlusion, or dissection. Calcified plaque within the proximal right internal carotid artery resultant short-segment stenosis of approximately 30% by NASCET criteria.  On the left, sequela of prior carotid  endarterectomy seen within the proximal left ICA. Just distal to the endarterectomy site, flow within the left ICA is markedly attenuated up to the level of the skullbase. There is absent flow within the petrous segment.  External carotid arteries and their branches are within normal limits.  Both vertebral arteries arise from the subclavian arteries. Left vertebral artery is dominant. Vertebral arteries well opacified up to the level of the skullbase without dissection or hemodynamically significant stenosis.  Thyroid gland within normal limits.  No  adenopathy.  Partially visualized lung apices are clear.  No acute osseous abnormality. No worrisome lytic or blastic osseous lesions.  IMPRESSION: CTA NECK IMPRESSION:  1. Prior left carotid endarterectomy with markedly attenuated flow within the left ICA distal to the endarterectomy, which then becomes completely occluded at the level of the skullbase/petrous segment of the left ICA. 2. Relatively mild right carotid atherosclerosis without hemodynamically significant stenosis in the neck. 3. Diminutive right vertebral artery, patent within the neck, but occludes at the skullbase just beyond the origin of the right PICA. 4. Dominant left vertebral artery, patent within the neck.  CTA HEAD IMPRESSION:  1. Occlusion of the petrous and cavernous left ICA. There is some reconstitution of the distal supra clinoid left ICA likely via collateral flow across the circle of Willis or via flow from the posterior circulation via the posterior communicating arteries. 2. Moderate to severe atheromatous stenosis within the cavernous right ICA. 3. Severe distal vertebral artery atherosclerosis. The distal right vertebral artery is occluded. There is chronic high-grade stenosis of the distal left vertebral artery. These changes are similar to prior studies. 4. Moderate atheromatous stenosis of the proximal posterior cerebral arteries bilaterally.   Electronically Signed   By: Jeannine Boga [name]   On: 02/26/2014 23:52   Ct Angio Neck W/cm &/or Wo/cm  02/26/2014   EXAM: CT ANGIOGRAPHY HEAD AND NECK  TECHNIQUE: Multidetector CT imaging of the head and neck was performed using the standard protocol during bolus administration of intravenous contrast. Multiplanar CT image reconstructions and MIPs were obtained to evaluate the vascular anatomy. Carotid stenosis measurements (when applicable) are obtained utilizing NASCET criteria, using the distal internal carotid diameter as the denominator.  CONTRAST:  30mL OMNIPAQUE IOHEXOL  350 MG/ML SOLN  COMPARISON:  Prior MRI from earlier the same day as well as prior CTA from 03/12/2011.  FINDINGS: CTA HEAD FINDINGS  ANTERIOR CIRCULATION:  The petrous and cavernous left ICA are occluded. There is reconstitution of the supra clinoid left ICA, likely via collateral flow cross the circle of Willis and VA the posterior circulation via the left posterior communicating artery. Petrous, cavernous, and supra clinoid right ICA are well opacified without hemodynamically significant stenosis. On the right, petrous right ICA is well opacified. There is calcified plaque within the cavernous right ICA with associated stenosis of up to 70% by NASCET criteria. Supra clinoid right ICA is well opacified.  Right A1 segment well opacified. The left A1 segment is hypoplastic. Anterior communicating artery grossly normal. Anterior cerebral arteries well opacified.  Multi focal atherosclerotic irregularity present within the left M1 segment without hemodynamically significant stenosis or occlusion. Right M1 segment is well opacified. Distal MCA branches symmetric bilaterally.  POSTERIOR CIRCULATION:  Heavy atheromatous plaque present within the distal aspects of the vertebral arteries bilaterally. There is occlusion of the right vertebral artery just distal to the takeoff of the right posterior inferior cerebral artery. Heavy atherosclerotic plaque within the left V4 segment is grossly stable relative to prior.  Atherosclerotic irregularity seen at the vertebrobasilar junction. The basilar artery itself is widely patent without hemodynamically significant stenosis. Anterior inferior cerebral arteries and superior cerebral arteries are patent bilaterally.  There is atherosclerotic irregularity at the origin of the posterior cerebral arteries bilaterally with moderate stenosis of both P1 segments. Flow within the right PCA is attenuated, similar to prior. Posterior communicating arteries are diminutive or absent.  CTA NECK  FINDINGS  Visualized aortic arch is of normal caliber with normal 3 vessel morphology. No high-grade stenosis seen at the origin of the great vessels.  Prominent atheromatous plaque present at the origin of the right subclavian artery from the right brachiocephalic artery. Prominent calcified and noncalcified plaque present within the proximal left subclavian artery is well. No associated high-grade stenosis. Subclavian arteries well opacified distally.  Common carotid arteries are well opacified bilaterally without hemodynamically significant stenosis or occlusion.  Calcified and noncalcified plaque present about the carotid bifurcations bilaterally, left greater than right.  Right internal carotid artery is well opacified to the level of the skullbase without hemodynamically significant stenosis, occlusion, or dissection. Calcified plaque within the proximal right internal carotid artery resultant short-segment stenosis of approximately 30% by NASCET criteria.  On the left, sequela of prior carotid endarterectomy seen within the proximal left ICA. Just distal to the endarterectomy site, flow within the left ICA is markedly attenuated up to the level of the skullbase. There is absent flow within the petrous segment.  External carotid arteries and their branches are within normal limits.  Both vertebral arteries arise from the subclavian arteries. Left vertebral artery is dominant. Vertebral arteries well opacified up to the level of the skullbase without dissection or hemodynamically significant stenosis.  Thyroid gland within normal limits.  No adenopathy.  Partially visualized lung apices are clear.  No acute osseous abnormality. No worrisome lytic or blastic osseous lesions.  IMPRESSION: CTA NECK IMPRESSION:  1. Prior left carotid endarterectomy with markedly attenuated flow within the left ICA distal to the endarterectomy, which then becomes completely occluded at the level of the skullbase/petrous segment of  the left ICA. 2. Relatively mild right carotid atherosclerosis without hemodynamically significant stenosis in the neck. 3. Diminutive right vertebral artery, patent within the neck, but occludes at the skullbase just beyond the origin of the right PICA. 4. Dominant left vertebral artery, patent within the neck.  CTA HEAD IMPRESSION:  1. Occlusion of the petrous and cavernous left ICA. There is some reconstitution of the distal supra clinoid left ICA likely via collateral flow across the circle of Willis or via flow from the posterior circulation via the posterior communicating arteries. 2. Moderate to severe atheromatous stenosis within the cavernous right ICA. 3. Severe distal vertebral artery atherosclerosis. The distal right vertebral artery is occluded. There is chronic high-grade stenosis of the distal left vertebral artery. These changes are similar to prior studies. 4. Moderate atheromatous stenosis of the proximal posterior cerebral arteries bilaterally.   Electronically Signed   By: Jeannine Boga [name]   On: 02/26/2014 23:52     PHYSICAL EXAM Frail middle aged Caucasian male not in distress.Awake alert. Afebrile. Head is nontraumatic. Neck is supple without bruit. Hearing is normal. Cardiac exam no murmur or gallop. Lungs are clear to auscultation. Distal pulses are well moderate  Neurological Exam : Awake alert oriented x 3. Moderate expressive aphasia . Impaired naming, repitition. Mild right lower face asymmetry. Tongue midline. No drift. Mild diminished fine finger movements on right. Orbits left over right upper extremity. Mild  right grip weak.. Normal sensation . Normal coordination.gait deferred ASSESSMENT/PLAN Mr. JEROL RUFENER is a 73 y.o. male with history of HT,Hyperlipidimia,OSA presenting with expressive aphasia  He   not receive IV t-PA  due to u nclear LSN MRI examination of the internal auditory canals shows left brain watershed infarcts and LICA occlusion  proximally   Stroke Dominant left brain watershed infarcts embolic secondary to proximal LICA occlusion Resultant  Moderate expressive aphasia  MRI     Small sacttered left frontal and pareital parasagittal watershed infarcts  MRA  LICA occluded in neck. suboptimal reconstitution of supraclinoid LICA Carotid Doppler  Findings suggest 1-39% right internal carotid artery stenosis. The left internal carotid artery waveform is atypical, suggestive of a possible distal obstruction. Vertebral arteries are patent with antegrade flow.    2D Echo   Left ventricle: The cavity size was normal. There was mild focal basal hypertrophy of the septum. Systolic function was at the lower limits of normal. The estimated ejection fraction was in the range of 50% to 55%.  LDL 52 mg%  HgbA1c 9.0  SCDs for VTE prophylaxis Diet heart healthy/carb modified Diet - low sodium heart healthy  Diet Carb Modified     aspirin 325 mg orally every day prior to admission, now on clopidogrel 75 mg orally every day  Patient counseled to be compliant with his antithrombotic medications  Ongoing aggressive risk factor management  Therapy recommendations:  pending  Disposition:  pending  Hypertension  Home meds:  Will resume   stable  Patient counseled to be compliant with his blood pressure medications  Hyperlipidemia  Home meds:    resumed in hospital  LDL 52, goal < 70  Continue statin at discharge  Diabetes  HgbA1c 9 goal < 7.0  Uncontrolled  Other Stroke Risk Factors  Advanced age  Obesity, Body mass index is 30.23 kg/(m^2).   No Hx stroke/TIA  Family hx stroke  Obstructive sleep apnea, on CPAP at home    Other Pertinent History I have personally examined this patient, reviewed notes, independently viewed imaging studies, participated in medical decision making and plan of care. I have made any additions or clarifications directly to the above note. Agree with note above.    Continue Aspirin 81 mg daily to plavix for 3 months then plavix alone. D/W Dr  Conni Elliot and patient Recommend outpatient follow-up with a vascular surgeon for asymptomatic right carotid stenosis revascularization F/U with me in 4 weeks or call earlier if necessary  Antony Contras, [place] Pager: [phone] 02/28/2014 3:11 PM   Hospital day # 3    To contact Stroke Continuity provider, please refer to [email or web address] After hours, contact General Neurology

## 2014-02-28 NOTE — Discharge Summary (Signed)
PATIENT DETAILS Name: Peter Nicholson Age: 73 y.o. Sex: male Date of Birth: February 07, 1941 MRN: [id number]. Admitting Physician: Jani Gravel, MD STM:[id number] M, MD  Admit Date: 02/25/2014 Discharge date: 02/28/2014  Recommendations for Outpatient Follow-up:  Please continue to aggressive treatment of risk factors for CVA Please repeat lipid panel and A1c in 3 months  PRIMARY DISCHARGE DIAGNOSIS:  Active Problems:   Diabetes   HYPERCHOLESTEROLEMIA   TIA (transient ischemic attack)   Tachycardia   Dysarthria   CVA (cerebral infarction)      PAST MEDICAL HISTORY: Past Medical History  Diagnosis Date  . Obstructive sleep apnea (adult) (pediatric)     doesnt wear CPAP  . HTN (hypertension)   . Cerebrovascular disease, unspecified   . Unspecified venous (peripheral) insufficiency   . Pure hypercholesterolemia   . Type II or unspecified type diabetes mellitus without mention of complication, not stated as uncontrolled   . Overweight(278.02)   . Diaphragmatic hernia without mention of obstruction or gangrene   . Esophageal reflux   . Diverticulosis of colon (without mention of hemorrhage)   . Other specified congenital anomaly of kidney   . Kidney carcinoma   . Calculus of kidney   . Gout, unspecified   . Lumbago   . Anxiety state, unspecified   . History of gallstones   . IBS (irritable bowel syndrome)   . History of seizures   . Bowel obstruction   . Osteoarthrosis, unspecified whether generalized or localized, unspecified site     tendonitis  . Blood transfusion without reported diagnosis   . Stroke     2001  . Seizures     last seizure November 2012    DISCHARGE MEDICATIONS: Current Discharge Medication List    START taking these medications   Details  lisinopril (PRINIVIL,ZESTRIL) 5 MG tablet Take 1 tablet (5 mg total) by mouth daily. Qty: 30 tablet, Refills: 0      CONTINUE these medications which have CHANGED   Details  aspirin EC 81 MG EC tablet  Take 1 tablet (81 mg total) by mouth daily. Qty: 30 tablet, Refills: 0    clopidogrel (PLAVIX) 75 MG tablet Take 1 tablet (75 mg total) by mouth daily. Qty: 90 tablet, Refills: 0    metFORMIN (GLUCOPHAGE) 500 MG tablet Take 2 tablets by mouth two times daily Qty: 360 tablet, Refills: 3      CONTINUE these medications which have NOT CHANGED   Details  allopurinol (ZYLOPRIM) 300 MG tablet TAKE 1 TABLET (300 MG TOTAL) BY MOUTH DAILY. Qty: 90 tablet, Refills: 1    ALPRAZolam (XANAX) 0.5 MG tablet TAKE ONE-HALF TO ONE TABLET BY MOUTH THREE TIMES DAILY AS NEEDED FOR NERVES Qty: 90 tablet, Refills: 5    Blood Glucose Monitoring Suppl (ONE TOUCH ULTRA SYSTEM KIT) W/DEVICE KIT Test blood sugar up to 3 times daily Qty: 1 each, Refills: 0    glucose blood test strip Test blood sugar up to 3 times daily Qty: 100 each, Refills: 6    glyBURIDE (DIABETA) 5 MG tablet Take 5 mg by mouth 2 (two) times daily with a meal.    Insulin Glargine (LANTUS SOLOSTAR) 100 UNIT/ML Solostar Pen Inject 10 Units into the skin at bedtime. Qty: 3 mL, Refills: 5    Insulin Pen Needle 31G X 8 MM MISC Use with Lantus Solostar Pen once daily as directed Qty: 100 each, Refills: 5    nortriptyline (PAMELOR) 25 MG capsule Take 25 mg by mouth at bedtime.  pantoprazole (PROTONIX) 40 MG tablet TAKE 1 TABLET (40 MG TOTAL) BY MOUTH DAILY AT 12 NOON. Qty: 90 tablet, Refills: 3    saxagliptin HCl (ONGLYZA) 5 MG TABS tablet Take 1 tablet (5 mg total) by mouth daily. Qty: 90 tablet, Refills: 3    sertraline (ZOLOFT) 50 MG tablet TAKE 1 TABLET BY MOUTH EVERY DAY AT BEDTIME Qty: 30 tablet, Refills: 2    simvastatin (ZOCOR) 40 MG tablet TAKE ONE TABLET BY MOUTH AT BEDTIME Qty: 90 tablet, Refills: 3    traMADol (ULTRAM) 50 MG tablet Take 50 mg by mouth every 6 (six) hours as needed for pain.       STOP taking these medications     cholecalciferol (VITAMIN D) 1000 UNITS tablet         ALLERGIES:   Allergies    Allergen Reactions  . Dilaudid [Hydromorphone Hcl] Other (See Comments)    hallucinations  . Fentanyl Other (See Comments)    hallucinations    BRIEF HPI:  See H&P, Labs, Consult and Test reports for all details in brief, patient is a 73 year old male with a history of CVA, type 2 diabetes, retention who was brought in for dysarthria. Further workup was positive for acute CVA.  CONSULTATIONS:   neurology  PERTINENT RADIOLOGIC STUDIES: Ct Angio Head W/cm &/or Wo Cm  02/26/2014   EXAM: CT ANGIOGRAPHY HEAD AND NECK  TECHNIQUE: Multidetector CT imaging of the head and neck was performed using the standard protocol during bolus administration of intravenous contrast. Multiplanar CT image reconstructions and MIPs were obtained to evaluate the vascular anatomy. Carotid stenosis measurements (when applicable) are obtained utilizing NASCET criteria, using the distal internal carotid diameter as the denominator.  CONTRAST:  3m OMNIPAQUE IOHEXOL 350 MG/ML SOLN  COMPARISON:  Prior MRI from earlier the same day as well as prior CTA from 03/12/2011.  FINDINGS: CTA HEAD FINDINGS  ANTERIOR CIRCULATION:  The petrous and cavernous left ICA are occluded. There is reconstitution of the supra clinoid left ICA, likely via collateral flow cross the circle of Willis and VA the posterior circulation via the left posterior communicating artery. Petrous, cavernous, and supra clinoid right ICA are well opacified without hemodynamically significant stenosis. On the right, petrous right ICA is well opacified. There is calcified plaque within the cavernous right ICA with associated stenosis of up to 70% by NASCET criteria. Supra clinoid right ICA is well opacified.  Right A1 segment well opacified. The left A1 segment is hypoplastic. Anterior communicating artery grossly normal. Anterior cerebral arteries well opacified.  Multi focal atherosclerotic irregularity present within the left M1 segment without hemodynamically  significant stenosis or occlusion. Right M1 segment is well opacified. Distal MCA branches symmetric bilaterally.  POSTERIOR CIRCULATION:  Heavy atheromatous plaque present within the distal aspects of the vertebral arteries bilaterally. There is occlusion of the right vertebral artery just distal to the takeoff of the right posterior inferior cerebral artery. Heavy atherosclerotic plaque within the left V4 segment is grossly stable relative to prior. Atherosclerotic irregularity seen at the vertebrobasilar junction. The basilar artery itself is widely patent without hemodynamically significant stenosis. Anterior inferior cerebral arteries and superior cerebral arteries are patent bilaterally.  There is atherosclerotic irregularity at the origin of the posterior cerebral arteries bilaterally with moderate stenosis of both P1 segments. Flow within the right PCA is attenuated, similar to prior. Posterior communicating arteries are diminutive or absent.  CTA NECK FINDINGS  Visualized aortic arch is of normal caliber with normal 3 vessel morphology.  No high-grade stenosis seen at the origin of the great vessels.  Prominent atheromatous plaque present at the origin of the right subclavian artery from the right brachiocephalic artery. Prominent calcified and noncalcified plaque present within the proximal left subclavian artery is well. No associated high-grade stenosis. Subclavian arteries well opacified distally.  Common carotid arteries are well opacified bilaterally without hemodynamically significant stenosis or occlusion.  Calcified and noncalcified plaque present about the carotid bifurcations bilaterally, left greater than right.  Right internal carotid artery is well opacified to the level of the skullbase without hemodynamically significant stenosis, occlusion, or dissection. Calcified plaque within the proximal right internal carotid artery resultant short-segment stenosis of approximately 30% by NASCET  criteria.  On the left, sequela of prior carotid endarterectomy seen within the proximal left ICA. Just distal to the endarterectomy site, flow within the left ICA is markedly attenuated up to the level of the skullbase. There is absent flow within the petrous segment.  External carotid arteries and their branches are within normal limits.  Both vertebral arteries arise from the subclavian arteries. Left vertebral artery is dominant. Vertebral arteries well opacified up to the level of the skullbase without dissection or hemodynamically significant stenosis.  Thyroid gland within normal limits.  No adenopathy.  Partially visualized lung apices are clear.  No acute osseous abnormality. No worrisome lytic or blastic osseous lesions.  IMPRESSION: CTA NECK IMPRESSION:  1. Prior left carotid endarterectomy with markedly attenuated flow within the left ICA distal to the endarterectomy, which then becomes completely occluded at the level of the skullbase/petrous segment of the left ICA. 2. Relatively mild right carotid atherosclerosis without hemodynamically significant stenosis in the neck. 3. Diminutive right vertebral artery, patent within the neck, but occludes at the skullbase just beyond the origin of the right PICA. 4. Dominant left vertebral artery, patent within the neck.  CTA HEAD IMPRESSION:  1. Occlusion of the petrous and cavernous left ICA. There is some reconstitution of the distal supra clinoid left ICA likely via collateral flow across the circle of Willis or via flow from the posterior circulation via the posterior communicating arteries. 2. Moderate to severe atheromatous stenosis within the cavernous right ICA. 3. Severe distal vertebral artery atherosclerosis. The distal right vertebral artery is occluded. There is chronic high-grade stenosis of the distal left vertebral artery. These changes are similar to prior studies. 4. Moderate atheromatous stenosis of the proximal posterior cerebral arteries  bilaterally.   Electronically Signed   By: Jeannine Boga [name]   On: 02/26/2014 23:52   Ct Head Wo Contrast  02/25/2014   CLINICAL DATA:  Code stroke. Trouble finding words at 1400 hr today.  EXAM: CT HEAD WITHOUT CONTRAST  TECHNIQUE: Contiguous axial images were obtained from the base of the skull through the vertex without intravenous contrast.  COMPARISON:  CT angio head 03/12/2011.  MRI brain 07/21/2012.  FINDINGS: Ventricles and sulci appear symmetrical. Probable old cerebellar infarcts. Patchy low-attenuation changes in the deep white matter consistent with small vessel ischemia. No mass effect or midline shift. No abnormal extra-axial fluid collections. Gray-white matter junctions are distinct. Basal cisterns are not effaced. No evidence of acute intracranial hemorrhage. No depressed skull fractures. Visualized paranasal sinuses and mastoid air cells are not opacified.  IMPRESSION: No acute intracranial abnormalities. Chronic small vessel ischemic changes and probable old cerebellar infarcts.  These results were called by telephone at the time of interpretation on 02/25/2014 at 11:29 pm to Dr. Jola Schmidt , who verbally acknowledged these results.  Electronically Signed   By: Lucienne Capers [name]   On: 02/25/2014 23:30   Ct Angio Neck W/cm &/or Wo/cm  02/26/2014   EXAM: CT ANGIOGRAPHY HEAD AND NECK  TECHNIQUE: Multidetector CT imaging of the head and neck was performed using the standard protocol during bolus administration of intravenous contrast. Multiplanar CT image reconstructions and MIPs were obtained to evaluate the vascular anatomy. Carotid stenosis measurements (when applicable) are obtained utilizing NASCET criteria, using the distal internal carotid diameter as the denominator.  CONTRAST:  36m OMNIPAQUE IOHEXOL 350 MG/ML SOLN  COMPARISON:  Prior MRI from earlier the same day as well as prior CTA from 03/12/2011.  FINDINGS: CTA HEAD FINDINGS  ANTERIOR CIRCULATION:  The petrous and  cavernous left ICA are occluded. There is reconstitution of the supra clinoid left ICA, likely via collateral flow cross the circle of Willis and VA the posterior circulation via the left posterior communicating artery. Petrous, cavernous, and supra clinoid right ICA are well opacified without hemodynamically significant stenosis. On the right, petrous right ICA is well opacified. There is calcified plaque within the cavernous right ICA with associated stenosis of up to 70% by NASCET criteria. Supra clinoid right ICA is well opacified.  Right A1 segment well opacified. The left A1 segment is hypoplastic. Anterior communicating artery grossly normal. Anterior cerebral arteries well opacified.  Multi focal atherosclerotic irregularity present within the left M1 segment without hemodynamically significant stenosis or occlusion. Right M1 segment is well opacified. Distal MCA branches symmetric bilaterally.  POSTERIOR CIRCULATION:  Heavy atheromatous plaque present within the distal aspects of the vertebral arteries bilaterally. There is occlusion of the right vertebral artery just distal to the takeoff of the right posterior inferior cerebral artery. Heavy atherosclerotic plaque within the left V4 segment is grossly stable relative to prior. Atherosclerotic irregularity seen at the vertebrobasilar junction. The basilar artery itself is widely patent without hemodynamically significant stenosis. Anterior inferior cerebral arteries and superior cerebral arteries are patent bilaterally.  There is atherosclerotic irregularity at the origin of the posterior cerebral arteries bilaterally with moderate stenosis of both P1 segments. Flow within the right PCA is attenuated, similar to prior. Posterior communicating arteries are diminutive or absent.  CTA NECK FINDINGS  Visualized aortic arch is of normal caliber with normal 3 vessel morphology. No high-grade stenosis seen at the origin of the great vessels.  Prominent  atheromatous plaque present at the origin of the right subclavian artery from the right brachiocephalic artery. Prominent calcified and noncalcified plaque present within the proximal left subclavian artery is well. No associated high-grade stenosis. Subclavian arteries well opacified distally.  Common carotid arteries are well opacified bilaterally without hemodynamically significant stenosis or occlusion.  Calcified and noncalcified plaque present about the carotid bifurcations bilaterally, left greater than right.  Right internal carotid artery is well opacified to the level of the skullbase without hemodynamically significant stenosis, occlusion, or dissection. Calcified plaque within the proximal right internal carotid artery resultant short-segment stenosis of approximately 30% by NASCET criteria.  On the left, sequela of prior carotid endarterectomy seen within the proximal left ICA. Just distal to the endarterectomy site, flow within the left ICA is markedly attenuated up to the level of the skullbase. There is absent flow within the petrous segment.  External carotid arteries and their branches are within normal limits.  Both vertebral arteries arise from the subclavian arteries. Left vertebral artery is dominant. Vertebral arteries well opacified up to the level of the skullbase without dissection or hemodynamically significant stenosis.  Thyroid gland within normal limits.  No adenopathy.  Partially visualized lung apices are clear.  No acute osseous abnormality. No worrisome lytic or blastic osseous lesions.  IMPRESSION: CTA NECK IMPRESSION:  1. Prior left carotid endarterectomy with markedly attenuated flow within the left ICA distal to the endarterectomy, which then becomes completely occluded at the level of the skullbase/petrous segment of the left ICA. 2. Relatively mild right carotid atherosclerosis without hemodynamically significant stenosis in the neck. 3. Diminutive right vertebral artery,  patent within the neck, but occludes at the skullbase just beyond the origin of the right PICA. 4. Dominant left vertebral artery, patent within the neck.  CTA HEAD IMPRESSION:  1. Occlusion of the petrous and cavernous left ICA. There is some reconstitution of the distal supra clinoid left ICA likely via collateral flow across the circle of Willis or via flow from the posterior circulation via the posterior communicating arteries. 2. Moderate to severe atheromatous stenosis within the cavernous right ICA. 3. Severe distal vertebral artery atherosclerosis. The distal right vertebral artery is occluded. There is chronic high-grade stenosis of the distal left vertebral artery. These changes are similar to prior studies. 4. Moderate atheromatous stenosis of the proximal posterior cerebral arteries bilaterally.   Electronically Signed   By: Jeannine Boga [name]   On: 02/26/2014 23:52   [name] Wo Contrast  02/26/2014   ADDENDUM REPORT: 02/26/2014 11:21  ADDENDUM: These results were called by telephone at the time of interpretation on 02/26/2014 at 11:00 am to Dr. Eliseo Squires , who verbally acknowledged these results.   Electronically Signed   By: Chauncey Cruel [name]   On: 02/26/2014 11:21   02/26/2014   CLINICAL DATA:  73 year old diabetic hypertensive male presenting with dysarthria. Subsequent encounter.  EXAM: MRI HEAD WITHOUT CONTRAST  MRA HEAD WITHOUT CONTRAST  TECHNIQUE: Multiplanar, multiecho pulse sequences of the brain and surrounding structures were obtained without intravenous contrast. Angiographic images of the head were obtained using MRA technique without contrast.  COMPARISON:  02/25/2014 CT.  07/21/2012 MR.  FINDINGS: MRI HEAD FINDINGS  Acute small nonhemorrhagic infarcts spread throughout the left frontal lobe (parasagittal distribution), left temporal lobe and at the left temporal-parietal lobe junction (periatrial region).  Remote bilateral cerebellar infarcts more notable on the right. Remote  tiny right thalamic infarcts. Mild small vessel disease type changes.  No hydrocephalus.  No intracranial hemorrhage.  No intracranial mass lesion noted on this unenhanced exam.  Mild cervical spondylotic changes upper cervical spine.  Abnormal appearance of the left internal carotid artery and right vertebral artery. Please see below.  MRA HEAD FINDINGS  Exam is motion degraded.  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 Reconstitution of flow left supraclinoid region appears inadequate as only a small amount of flow seen at the left carotid terminus and portions of the left middle cerebral artery. Findings new from the prior exam.  Moderate to marked stenosis right internal carotid artery cavernous segment.  A2 segment of the anterior cerebral arteries supplied bilaterally from the right.  Moderate narrowing proximal A1 segment left anterior cerebral artery.  Vessel at the anterior communicating artery region appears to be branch rather than aneurysm.  Occluded right vertebral artery and right posterior inferior cerebral artery.  High-grade multiple stenosis of the distal left vertebral artery.  Moderate narrowing proximal basilar artery.  Moderate stenosis portions of the P1 segment of the posterior cerebral artery more notable on the right. Nonvisualized distal branches of the right posterior cerebral artery.  IMPRESSION: MRI HEAD  Acute small nonhemorrhagic infarcts spread throughout the left frontal lobe (parasagittal distribution), left temporal lobe and at the left temporal-parietal lobe junction (periatrial region).  Remote bilateral cerebellar infarcts more notable on the right. Remote tiny right thalamic infarcts. Mild small vessel disease type changes.  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 Reconstitution of flow left supraclinoid region appears inadequate as only a small amount of flow seen at the left carotid terminus and portions of the left middle cerebral artery. Findings new from the  prior exam.  Moderate to marked stenosis right internal carotid artery cavernous segment.  A2 segment of the anterior cerebral arteries supplied bilaterally from the right.  Moderate narrowing proximal A1 segment left anterior cerebral artery.  Occluded right vertebral artery and right posterior inferior cerebral artery. This is noted previously.  High-grade multiple stenosis of the distal left vertebral artery. This is noted previously.  Moderate narrowing proximal basilar artery. This is noted previously.  Moderate stenosis portions of the P1 segment of the posterior cerebral artery more notable on the right. Nonvisualized distal branches of the right posterior cerebral artery.  These results will be called to the ordering clinician or representative by the Radiologist Assistant, and communication documented in the PACS or zVision Dashboard.  Electronically Signed: By: Chauncey Cruel [name] On: 02/26/2014 08:49   Mr Brain Wo Contrast  02/26/2014   ADDENDUM REPORT: 02/26/2014 11:21  ADDENDUM: These results were called by telephone at the time of interpretation on 02/26/2014 at 11:00 am to Dr. Eliseo Squires , who verbally acknowledged these results.   Electronically Signed   By: Chauncey Cruel [name]   On: 02/26/2014 11:21   02/26/2014   CLINICAL DATA:  73 year old diabetic hypertensive male presenting with dysarthria. Subsequent encounter.  EXAM: MRI HEAD WITHOUT CONTRAST  MRA HEAD WITHOUT CONTRAST  TECHNIQUE: Multiplanar, multiecho pulse sequences of the brain and surrounding structures were obtained without intravenous contrast. Angiographic images of the head were obtained using MRA technique without contrast.  COMPARISON:  02/25/2014 CT.  07/21/2012 MR.  FINDINGS: MRI HEAD FINDINGS  Acute small nonhemorrhagic infarcts spread throughout the left frontal lobe (parasagittal distribution), left temporal lobe and at the left temporal-parietal lobe junction (periatrial region).  Remote bilateral cerebellar infarcts more notable  on the right. Remote tiny right thalamic infarcts. Mild small vessel disease type changes.  No hydrocephalus.  No intracranial hemorrhage.  No intracranial mass lesion noted on this unenhanced exam.  Mild cervical spondylotic changes upper cervical spine.  Abnormal appearance of the left internal carotid artery and right vertebral artery. Please see below.  MRA HEAD FINDINGS  Exam is motion degraded.  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 Reconstitution of flow left supraclinoid region appears inadequate as only a small amount of flow seen at the left carotid terminus and portions of the left middle cerebral artery. Findings new from the prior exam.  Moderate to marked stenosis right internal carotid artery cavernous segment.  A2 segment of the anterior cerebral arteries supplied bilaterally from the right.  Moderate narrowing proximal A1 segment left anterior cerebral artery.  Vessel at the anterior communicating artery region appears to be branch rather than aneurysm.  Occluded right vertebral artery and right posterior inferior cerebral artery.  High-grade multiple stenosis of the distal left vertebral artery.  Moderate narrowing proximal basilar artery.  Moderate stenosis portions of the P1 segment of the posterior cerebral artery more notable on the right. Nonvisualized distal branches of the right posterior cerebral artery.  IMPRESSION: MRI HEAD  Acute small nonhemorrhagic infarcts spread throughout the left frontal lobe (parasagittal distribution), left temporal lobe and at the left temporal-parietal lobe junction (periatrial region).  Remote bilateral cerebellar infarcts more notable on the right. Remote tiny right thalamic infarcts. Mild small vessel disease type changes.  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 Reconstitution of flow left supraclinoid region appears inadequate as only a small amount of flow seen at the left carotid terminus and portions of the left middle cerebral artery.  Findings new from the prior exam.  Moderate to marked stenosis right internal carotid artery cavernous segment.  A2 segment of the anterior cerebral arteries supplied bilaterally from the right.  Moderate narrowing proximal A1 segment left anterior cerebral artery.  Occluded right vertebral artery and right posterior inferior cerebral artery. This is noted previously.  High-grade multiple stenosis of the distal left vertebral artery. This is noted previously.  Moderate narrowing proximal basilar artery. This is noted previously.  Moderate stenosis portions of the P1 segment of the posterior cerebral artery more notable on the right. Nonvisualized distal branches of the right posterior cerebral artery.  These results will be called to the ordering clinician or representative by the Radiologist Assistant, and communication documented in the PACS or zVision Dashboard.  Electronically Signed: By: Chauncey Cruel [name] On: 02/26/2014 08:49     PERTINENT LAB RESULTS: CBC:  Recent Labs  02/27/14 1315 02/28/14 0715  WBC 8.9 6.4  HGB 12.0* 11.2*  HCT 36.2* 34.1*  PLT 203 183   CMET CMP     Component Value Date/Time   NA 138 02/28/2014 0715   K 4.2 02/28/2014 0715   CL 105 02/28/2014 0715   CO2 24 02/28/2014 0715   GLUCOSE 125* 02/28/2014 0715   BUN 14 02/28/2014 0715   CREATININE 1.17 02/28/2014 0715   CALCIUM 8.7 02/28/2014 0715   CALCIUM 8.2* 03/11/2011 0547   PROT 7.0 02/25/2014 2310   ALBUMIN 3.5 02/25/2014 2310   AST 18 02/25/2014 2310   ALT 16 02/25/2014 2310   ALKPHOS 101 02/25/2014 2310   BILITOT 0.2* 02/25/2014 2310   GFRNONAA 60* 02/28/2014 0715   GFRAA 69* 02/28/2014 0715    GFR Estimated Creatinine Clearance: 56.6 mL/min (by C-G formula based on Cr of 1.17). No results for input(s): LIPASE, AMYLASE in the last 72 hours.  Recent Labs  02/26/14 0416 02/26/14 0525 02/26/14 1221  TROPONINI <0.30 <0.30 <0.30   Invalid input(s): POCBNP No results for input(s): DDIMER in  the last 72 hours.  Recent Labs  02/26/14 0016  HGBA1C 9.0*    Recent Labs  02/26/14 0416 02/27/14 0613  CHOL 110 124  HDL 41 40  LDLCALC 52 60  TRIG 87 118  CHOLHDL 2.7 3.1    Recent Labs  02/26/14 0416  TSH 1.400   No results for input(s): VITAMINB12, FOLATE, FERRITIN, TIBC, IRON, RETICCTPCT in the last 72 hours. Coags:  Recent Labs  02/25/14 2310  INR 1.01   Microbiology: No results found for this or any previous visit (from the past 240 hour(s)).   BRIEF HOSPITAL COURSE:   Active Problems:   Acute BWI:OMBTDHR was admitted with dysarthria, further workup including a MRI of the brain was done. MRI of the brain showed scattered left frontal and parietal area sagittal watershed infarcts. A carotid Doppler was suggestive of possible left distal internal carotid artery obstruction. 2-D echocardiogram was negative for any embolic source. Neurology recommended a carotid angiogram which showed severe preocclusive stenosis of the left  proximal ICA, 80-85% stenosis of the right ICA proximal cavernous segment, 80% stenosis of the dominant left vertebral basilar junction just proximal to the basilar artery. This [name], spoke with stroke [name]-Dr. Leonie Man on 11/18, and reviewed these angiogram findings. Recommendations from Dr. Leonie Man are to discharge patient on aspirin and Plavix. Dr. Leonie Man does not recommend any vascular consult at this time.Patient has been asked to follow-up with Dr. Leonie Man in 1 month.Please note, significantly improved speech at the time of discharge. Nonfocal exam.  Type 2 diabetes:A1c was 9.0. Patient was managed with sliding scale insulin and 10 units of Lantus while inpatient. On discharge patient is going to be resumed on Lantus, metformin, glyburide and Onglyza.Recommend repeat A1c in 3 months as an outpatient.  Hypertension: this was stable, patient will be started on low-dose lisinopril on discharge. Blood pressure moderately controlled without the use of any  antihypertensive medications while inpatient.  Dyslipidemia:LDL at 16, continue statins.  Sinus tachycardia: seems to have resolved, suspect secondary to anxiety. Heart rate today mostly in the 80s to 90s.2-D echocardiogram showed preserved ejection fraction, TSH within normal range.  TODAY-DAY OF DISCHARGE:  Subjective:   Prabhjot Piscitello today has no headache,no chest abdominal pain,no new weakness tingling or numbness, feels much better wants to go home today.   Objective:   Blood pressure 167/66, pulse 98, temperature 98 F (36.7 C), temperature source Oral, resp. rate 18, height '5\' 6"'  (1.676 m), weight 84.913 kg (187 lb 3.2 oz), SpO2 100 %.  Intake/Output Summary (Last 24 hours) at 02/28/14 1418 Last data filed at 02/28/14 1300  Gross per 24 hour  Intake    360 ml  Output    300 ml  Net     60 ml   Filed Weights   02/25/14 2300 02/26/14 0142  Weight: 86.183 kg (190 lb) 84.913 kg (187 lb 3.2 oz)    Exam Awake Alert, Oriented *3, No new F.N deficits, Normal affect Pax.AT,PERRAL Supple Neck,No JVD, No cervical lymphadenopathy appriciated.  Symmetrical Chest wall movement, Good air movement bilaterally, CTAB RRR,No Gallops,Rubs or new Murmurs, No Parasternal Heave +ve B.Sounds, Abd Soft, Non tender, No organomegaly appriciated, No rebound -guarding or rigidity. No Cyanosis, Clubbing or edema, No new Rash or bruise  DISCHARGE CONDITION: Stable  DISPOSITION: Home  DISCHARGE INSTRUCTIONS:    Activity:  As tolerated   Diet recommendation: Diabetic Diet Heart Healthy diet  Discharge Instructions    Call MD for:  extreme fatigue    Complete by:  As directed      Call MD for:  persistant dizziness or light-headedness    Complete by:  As directed      Diet - low sodium heart healthy    Complete by:  As directed      Diet Carb Modified    Complete by:  As directed      Increase activity slowly    Complete by:  As directed            Follow-up Information     Follow up with NADEL,SCOTT M, MD. Schedule an appointment as soon as possible for a visit in 1 week.   Specialty:  Pulmonary Disease   Contact information:   [place] [address] [phone]       Follow up with SETHI,PRAMOD, MD. Schedule an appointment as soon as possible for a visit in 1 month.   Specialties:  Neurology, Radiology   Contact information:   [address]  [place] [address] [phone]       Total Time spent on discharge equals 45 minutes.  SignedOren Binet 02/28/2014 2:18 PM

## 2014-02-28 NOTE — Progress Notes (Signed)
Talked to patient with spouse present about Outpatient speech therapy; patient in agreement to go to the [facility] in [place] for outpatient speech therapy; orders and clinical information faxed to the rehab center / they will contact the patient at home for a date and start up time for therapy; Aneta Mins [phone]

## 2014-03-02 ENCOUNTER — Other Ambulatory Visit: Payer: Self-pay | Admitting: Pulmonary Disease

## 2014-03-02 MED ORDER — SERTRALINE HCL 50 MG PO TABS: 50.0000 mg | ORAL_TABLET | Freq: Every day | ORAL | Status: DC

## 2014-03-06 ENCOUNTER — Other Ambulatory Visit (HOSPITAL_COMMUNITY): Payer: Self-pay | Admitting: Interventional Radiology

## 2014-03-06 DIAGNOSIS — I771 Stricture of artery: Secondary | ICD-10-CM

## 2014-03-12 ENCOUNTER — Ambulatory Visit (HOSPITAL_COMMUNITY)
Admission: RE | Admit: 2014-03-12 | Discharge: 2014-03-12 | Disposition: A | Discharge status: Home / Self Care | Payer: Medicare Other | Source: Ambulatory Visit | Attending: Interventional Radiology | Admitting: Interventional Radiology

## 2014-03-12 DIAGNOSIS — I771 Stricture of artery: Secondary | ICD-10-CM

## 2014-03-12 NOTE — Telephone Encounter (Signed)
No call back from patient.  Encounter closed.   

## 2014-03-27 ENCOUNTER — Other Ambulatory Visit (HOSPITAL_COMMUNITY): Payer: Self-pay | Admitting: Interventional Radiology

## 2014-03-27 DIAGNOSIS — I771 Stricture of artery: Secondary | ICD-10-CM

## 2014-03-27 DIAGNOSIS — I639 Cerebral infarction, unspecified: Secondary | ICD-10-CM

## 2014-04-08 ENCOUNTER — Emergency Department (HOSPITAL_COMMUNITY): Payer: Medicare Other

## 2014-04-08 ENCOUNTER — Encounter (HOSPITAL_COMMUNITY): Payer: Self-pay | Admitting: *Deleted

## 2014-04-08 ENCOUNTER — Inpatient Hospital Stay (HOSPITAL_COMMUNITY)
Admission: EM | Admit: 2014-04-08 | Discharge: 2014-04-13 | DRG: 064 | Disposition: A | Discharge status: Home / Self Care | Payer: Medicare Other | Attending: Internal Medicine | Admitting: Internal Medicine

## 2014-04-08 DIAGNOSIS — I6529 Occlusion and stenosis of unspecified carotid artery: Secondary | ICD-10-CM | POA: Insufficient documentation

## 2014-04-08 DIAGNOSIS — Z794 Long term (current) use of insulin: Secondary | ICD-10-CM

## 2014-04-08 DIAGNOSIS — M1990 Unspecified osteoarthritis, unspecified site: Secondary | ICD-10-CM | POA: Diagnosis present

## 2014-04-08 DIAGNOSIS — E7800 Pure hypercholesterolemia, unspecified: Secondary | ICD-10-CM | POA: Diagnosis present

## 2014-04-08 DIAGNOSIS — G459 Transient cerebral ischemic attack, unspecified: Secondary | ICD-10-CM | POA: Diagnosis present

## 2014-04-08 DIAGNOSIS — Z885 Allergy status to narcotic agent status: Secondary | ICD-10-CM

## 2014-04-08 DIAGNOSIS — I639 Cerebral infarction, unspecified: Secondary | ICD-10-CM | POA: Insufficient documentation

## 2014-04-08 DIAGNOSIS — R270 Ataxia, unspecified: Secondary | ICD-10-CM | POA: Diagnosis present

## 2014-04-08 DIAGNOSIS — I6789 Other cerebrovascular disease: Secondary | ICD-10-CM | POA: Diagnosis present

## 2014-04-08 DIAGNOSIS — N390 Urinary tract infection, site not specified: Secondary | ICD-10-CM | POA: Diagnosis present

## 2014-04-08 DIAGNOSIS — I63032 Cerebral infarction due to thrombosis of left carotid artery: Secondary | ICD-10-CM | POA: Diagnosis not present

## 2014-04-08 DIAGNOSIS — G9340 Encephalopathy, unspecified: Secondary | ICD-10-CM | POA: Diagnosis present

## 2014-04-08 DIAGNOSIS — Z905 Acquired absence of kidney: Secondary | ICD-10-CM | POA: Diagnosis present

## 2014-04-08 DIAGNOSIS — E1165 Type 2 diabetes mellitus with hyperglycemia: Secondary | ICD-10-CM | POA: Diagnosis present

## 2014-04-08 DIAGNOSIS — Z79899 Other long term (current) drug therapy: Secondary | ICD-10-CM

## 2014-04-08 DIAGNOSIS — Z7982 Long term (current) use of aspirin: Secondary | ICD-10-CM

## 2014-04-08 DIAGNOSIS — G4733 Obstructive sleep apnea (adult) (pediatric): Secondary | ICD-10-CM | POA: Diagnosis present

## 2014-04-08 DIAGNOSIS — I10 Essential (primary) hypertension: Secondary | ICD-10-CM | POA: Diagnosis present

## 2014-04-08 DIAGNOSIS — R4182 Altered mental status, unspecified: Secondary | ICD-10-CM | POA: Diagnosis not present

## 2014-04-08 DIAGNOSIS — Z7902 Long term (current) use of antithrombotics/antiplatelets: Secondary | ICD-10-CM

## 2014-04-08 DIAGNOSIS — Z8673 Personal history of transient ischemic attack (TIA), and cerebral infarction without residual deficits: Secondary | ICD-10-CM

## 2014-04-08 DIAGNOSIS — M109 Gout, unspecified: Secondary | ICD-10-CM | POA: Diagnosis present

## 2014-04-08 DIAGNOSIS — I6523 Occlusion and stenosis of bilateral carotid arteries: Secondary | ICD-10-CM | POA: Diagnosis present

## 2014-04-08 DIAGNOSIS — R4781 Slurred speech: Secondary | ICD-10-CM | POA: Diagnosis present

## 2014-04-08 DIAGNOSIS — R4701 Aphasia: Secondary | ICD-10-CM | POA: Diagnosis present

## 2014-04-08 DIAGNOSIS — E785 Hyperlipidemia, unspecified: Secondary | ICD-10-CM | POA: Insufficient documentation

## 2014-04-08 DIAGNOSIS — F411 Generalized anxiety disorder: Secondary | ICD-10-CM | POA: Diagnosis present

## 2014-04-08 DIAGNOSIS — K219 Gastro-esophageal reflux disease without esophagitis: Secondary | ICD-10-CM | POA: Diagnosis present

## 2014-04-08 DIAGNOSIS — R739 Hyperglycemia, unspecified: Secondary | ICD-10-CM

## 2014-04-08 DIAGNOSIS — IMO0002 Reserved for concepts with insufficient information to code with codable children: Secondary | ICD-10-CM

## 2014-04-08 DIAGNOSIS — I771 Stricture of artery: Secondary | ICD-10-CM

## 2014-04-08 DIAGNOSIS — I872 Venous insufficiency (chronic) (peripheral): Secondary | ICD-10-CM | POA: Diagnosis present

## 2014-04-08 DIAGNOSIS — R410 Disorientation, unspecified: Secondary | ICD-10-CM | POA: Diagnosis present

## 2014-04-08 DIAGNOSIS — I679 Cerebrovascular disease, unspecified: Secondary | ICD-10-CM | POA: Insufficient documentation

## 2014-04-08 LAB — DIFFERENTIAL
Basophils Absolute: 0.1 10*3/uL (ref 0.0–0.1)
Basophils Relative: 1 % (ref 0–1)
EOS PCT: 1 % (ref 0–5)
Eosinophils Absolute: 0.1 10*3/uL (ref 0.0–0.7)
LYMPHS ABS: 1.2 10*3/uL (ref 0.7–4.0)
Lymphocytes Relative: 13 % (ref 12–46)
Monocytes Absolute: 0.6 10*3/uL (ref 0.1–1.0)
Monocytes Relative: 6 % (ref 3–12)
Neutro Abs: 7.4 10*3/uL (ref 1.7–7.7)
Neutrophils Relative %: 79 % — ABNORMAL HIGH (ref 43–77)

## 2014-04-08 LAB — I-STAT CHEM 8, ED
BUN: 27 mg/dL — AB (ref 6–23)
CALCIUM ION: 1.18 mmol/L (ref 1.13–1.30)
CHLORIDE: 100 meq/L (ref 96–112)
CREATININE: 1.4 mg/dL — AB (ref 0.50–1.35)
Glucose, Bld: 665 mg/dL (ref 70–99)
HCT: 44 % (ref 39.0–52.0)
Hemoglobin: 15 g/dL (ref 13.0–17.0)
Potassium: 4 mmol/L (ref 3.5–5.1)
Sodium: 134 mmol/L — ABNORMAL LOW (ref 135–145)
TCO2: 20 mmol/L (ref 0–100)

## 2014-04-08 LAB — URINALYSIS, ROUTINE W REFLEX MICROSCOPIC
BILIRUBIN URINE: NEGATIVE
KETONES UR: NEGATIVE mg/dL
NITRITE: NEGATIVE
PH: 5.5 (ref 5.0–8.0)
Protein, ur: 30 mg/dL — AB
SPECIFIC GRAVITY, URINE: 1.028 (ref 1.005–1.030)
Urobilinogen, UA: 0.2 mg/dL (ref 0.0–1.0)

## 2014-04-08 LAB — CBC
HCT: 40.7 % (ref 39.0–52.0)
Hemoglobin: 13.3 g/dL (ref 13.0–17.0)
MCH: 31.3 pg (ref 26.0–34.0)
MCHC: 32.7 g/dL (ref 30.0–36.0)
MCV: 95.8 fL (ref 78.0–100.0)
Platelets: 250 10*3/uL (ref 150–400)
RBC: 4.25 MIL/uL (ref 4.22–5.81)
RDW: 12.2 % (ref 11.5–15.5)
WBC: 9.3 10*3/uL (ref 4.0–10.5)

## 2014-04-08 LAB — COMPREHENSIVE METABOLIC PANEL
ALT: 19 U/L (ref 0–53)
AST: 23 U/L (ref 0–37)
Albumin: 3.6 g/dL (ref 3.5–5.2)
Alkaline Phosphatase: 95 U/L (ref 39–117)
Anion gap: 10 (ref 5–15)
BUN: 21 mg/dL (ref 6–23)
CALCIUM: 9 mg/dL (ref 8.4–10.5)
CHLORIDE: 98 meq/L (ref 96–112)
CO2: 22 mmol/L (ref 19–32)
Creatinine, Ser: 1.65 mg/dL — ABNORMAL HIGH (ref 0.50–1.35)
GFR calc Af Amer: 46 mL/min — ABNORMAL LOW (ref >90)
GFR, EST NON AFRICAN AMERICAN: 39 mL/min — AB (ref >90)
GLUCOSE: 663 mg/dL — AB (ref 70–99)
Potassium: 3.9 mmol/L (ref 3.5–5.1)
SODIUM: 130 mmol/L — AB (ref 135–145)
Total Bilirubin: 0.4 mg/dL (ref 0.3–1.2)
Total Protein: 6.7 g/dL (ref 6.0–8.3)

## 2014-04-08 LAB — PROTIME-INR
INR: 1.07 (ref 0.00–1.49)
Prothrombin Time: 14 seconds (ref 11.6–15.2)

## 2014-04-08 LAB — I-STAT TROPONIN, ED: Troponin i, poc: 0 ng/mL (ref 0.00–0.08)

## 2014-04-08 LAB — APTT: APTT: 27 s (ref 24–37)

## 2014-04-08 LAB — URINE MICROSCOPIC-ADD ON

## 2014-04-08 LAB — CBG MONITORING, ED: Glucose-Capillary: 377 mg/dL — ABNORMAL HIGH (ref 70–99)

## 2014-04-08 MED ORDER — INSULIN ASPART 100 UNIT/ML ~~LOC~~ SOLN
10.0000 [IU] | Freq: Once | SUBCUTANEOUS | Status: AC
  Administered 2014-04-08: 10 [IU] via INTRAVENOUS
  Filled 2014-04-08: qty 1

## 2014-04-08 NOTE — ED Notes (Signed)
The pt has had stroke symptoms since last night.  He had a stroke 3 weeks ago.  He had soeech and gait problems last pm.  The pt denies that there is any problem.  The pt has been angry since yesterday also.  He denies any pain.  Speech is clear

## 2014-04-09 ENCOUNTER — Observation Stay (HOSPITAL_COMMUNITY): Payer: Medicare Other

## 2014-04-09 DIAGNOSIS — Z905 Acquired absence of kidney: Secondary | ICD-10-CM | POA: Diagnosis present

## 2014-04-09 DIAGNOSIS — M1990 Unspecified osteoarthritis, unspecified site: Secondary | ICD-10-CM | POA: Diagnosis present

## 2014-04-09 DIAGNOSIS — M109 Gout, unspecified: Secondary | ICD-10-CM | POA: Diagnosis present

## 2014-04-09 DIAGNOSIS — I6789 Other cerebrovascular disease: Secondary | ICD-10-CM | POA: Diagnosis present

## 2014-04-09 DIAGNOSIS — Z8673 Personal history of transient ischemic attack (TIA), and cerebral infarction without residual deficits: Secondary | ICD-10-CM | POA: Diagnosis not present

## 2014-04-09 DIAGNOSIS — Z7902 Long term (current) use of antithrombotics/antiplatelets: Secondary | ICD-10-CM | POA: Diagnosis not present

## 2014-04-09 DIAGNOSIS — Z885 Allergy status to narcotic agent status: Secondary | ICD-10-CM | POA: Diagnosis not present

## 2014-04-09 DIAGNOSIS — I6523 Occlusion and stenosis of bilateral carotid arteries: Secondary | ICD-10-CM

## 2014-04-09 DIAGNOSIS — E780 Pure hypercholesterolemia: Secondary | ICD-10-CM

## 2014-04-09 DIAGNOSIS — R4701 Aphasia: Secondary | ICD-10-CM | POA: Diagnosis present

## 2014-04-09 DIAGNOSIS — I10 Essential (primary) hypertension: Secondary | ICD-10-CM | POA: Diagnosis present

## 2014-04-09 DIAGNOSIS — E785 Hyperlipidemia, unspecified: Secondary | ICD-10-CM | POA: Diagnosis present

## 2014-04-09 DIAGNOSIS — R270 Ataxia, unspecified: Secondary | ICD-10-CM | POA: Diagnosis present

## 2014-04-09 DIAGNOSIS — I63032 Cerebral infarction due to thrombosis of left carotid artery: Secondary | ICD-10-CM | POA: Diagnosis present

## 2014-04-09 DIAGNOSIS — R410 Disorientation, unspecified: Secondary | ICD-10-CM

## 2014-04-09 DIAGNOSIS — I872 Venous insufficiency (chronic) (peripheral): Secondary | ICD-10-CM | POA: Diagnosis present

## 2014-04-09 DIAGNOSIS — G9340 Encephalopathy, unspecified: Secondary | ICD-10-CM | POA: Diagnosis present

## 2014-04-09 DIAGNOSIS — I639 Cerebral infarction, unspecified: Secondary | ICD-10-CM | POA: Diagnosis present

## 2014-04-09 DIAGNOSIS — I6529 Occlusion and stenosis of unspecified carotid artery: Secondary | ICD-10-CM | POA: Insufficient documentation

## 2014-04-09 DIAGNOSIS — G4733 Obstructive sleep apnea (adult) (pediatric): Secondary | ICD-10-CM | POA: Diagnosis present

## 2014-04-09 DIAGNOSIS — Z79899 Other long term (current) drug therapy: Secondary | ICD-10-CM | POA: Diagnosis not present

## 2014-04-09 DIAGNOSIS — E1165 Type 2 diabetes mellitus with hyperglycemia: Secondary | ICD-10-CM | POA: Diagnosis present

## 2014-04-09 DIAGNOSIS — F411 Generalized anxiety disorder: Secondary | ICD-10-CM | POA: Diagnosis present

## 2014-04-09 DIAGNOSIS — R4781 Slurred speech: Secondary | ICD-10-CM | POA: Diagnosis present

## 2014-04-09 DIAGNOSIS — Z7982 Long term (current) use of aspirin: Secondary | ICD-10-CM | POA: Diagnosis not present

## 2014-04-09 DIAGNOSIS — R4182 Altered mental status, unspecified: Secondary | ICD-10-CM | POA: Diagnosis present

## 2014-04-09 DIAGNOSIS — R739 Hyperglycemia, unspecified: Secondary | ICD-10-CM

## 2014-04-09 DIAGNOSIS — I679 Cerebrovascular disease, unspecified: Secondary | ICD-10-CM

## 2014-04-09 DIAGNOSIS — K219 Gastro-esophageal reflux disease without esophagitis: Secondary | ICD-10-CM | POA: Diagnosis present

## 2014-04-09 DIAGNOSIS — G459 Transient cerebral ischemic attack, unspecified: Secondary | ICD-10-CM

## 2014-04-09 DIAGNOSIS — N390 Urinary tract infection, site not specified: Secondary | ICD-10-CM | POA: Diagnosis present

## 2014-04-09 DIAGNOSIS — Z794 Long term (current) use of insulin: Secondary | ICD-10-CM | POA: Diagnosis not present

## 2014-04-09 LAB — RAPID URINE DRUG SCREEN, HOSP PERFORMED
Amphetamines: NOT DETECTED
Barbiturates: NOT DETECTED
Benzodiazepines: NOT DETECTED
COCAINE: NOT DETECTED
OPIATES: NOT DETECTED
TETRAHYDROCANNABINOL: NOT DETECTED

## 2014-04-09 LAB — CBC
HEMATOCRIT: 36.8 % — AB (ref 39.0–52.0)
Hemoglobin: 12.6 g/dL — ABNORMAL LOW (ref 13.0–17.0)
MCH: 32 pg (ref 26.0–34.0)
MCHC: 34.2 g/dL (ref 30.0–36.0)
MCV: 93.4 fL (ref 78.0–100.0)
Platelets: 235 10*3/uL (ref 150–400)
RBC: 3.94 MIL/uL — AB (ref 4.22–5.81)
RDW: 12 % (ref 11.5–15.5)
WBC: 9.7 10*3/uL (ref 4.0–10.5)

## 2014-04-09 LAB — GLUCOSE, CAPILLARY
GLUCOSE-CAPILLARY: 336 mg/dL — AB (ref 70–99)
GLUCOSE-CAPILLARY: 167 mg/dL — AB (ref 70–99)
GLUCOSE-CAPILLARY: 163 mg/dL — AB (ref 70–99)
Glucose-Capillary: 270 mg/dL — ABNORMAL HIGH (ref 70–99)
Glucose-Capillary: 194 mg/dL — ABNORMAL HIGH (ref 70–99)

## 2014-04-09 LAB — LIPID PANEL
CHOL/HDL RATIO: 4.3 ratio
Cholesterol: 175 mg/dL (ref 0–200)
HDL: 41 mg/dL (ref >39)
LDL CALC: 113 mg/dL — AB (ref 0–99)
Triglycerides: 105 mg/dL (ref <150)
VLDL: 21 mg/dL (ref 0–40)

## 2014-04-09 LAB — HEMOGLOBIN A1C
Hgb A1c MFr Bld: 9.3 % — ABNORMAL HIGH (ref <5.7)
Mean Plasma Glucose: 220 mg/dL — ABNORMAL HIGH (ref <117)

## 2014-04-09 LAB — BASIC METABOLIC PANEL
Anion gap: 6 (ref 5–15)
BUN: 18 mg/dL (ref 6–23)
CO2: 25 mmol/L (ref 19–32)
CREATININE: 1.15 mg/dL (ref 0.50–1.35)
Calcium: 9.1 mg/dL (ref 8.4–10.5)
Chloride: 109 mEq/L (ref 96–112)
GFR calc Af Amer: 71 mL/min — ABNORMAL LOW (ref >90)
GFR calc non Af Amer: 61 mL/min — ABNORMAL LOW (ref >90)
Glucose, Bld: 181 mg/dL — ABNORMAL HIGH (ref 70–99)
Potassium: 3.8 mmol/L (ref 3.5–5.1)
Sodium: 140 mmol/L (ref 135–145)

## 2014-04-09 MED ORDER — CLOPIDOGREL BISULFATE 75 MG PO TABS
75.0000 mg | ORAL_TABLET | Freq: Every day | ORAL | Status: DC
  Administered 2014-04-09 – 2014-04-13 (×4): 75 mg via ORAL
  Filled 2014-04-09 (×5): qty 1

## 2014-04-09 MED ORDER — LISINOPRIL 5 MG PO TABS
5.0000 mg | ORAL_TABLET | Freq: Every day | ORAL | Status: DC
  Administered 2014-04-09: 5 mg via ORAL
  Filled 2014-04-09: qty 1

## 2014-04-09 MED ORDER — ALLOPURINOL 100 MG PO TABS
300.0000 mg | ORAL_TABLET | Freq: Every day | ORAL | Status: DC
  Administered 2014-04-09 – 2014-04-13 (×4): 300 mg via ORAL
  Filled 2014-04-09 (×4): qty 3

## 2014-04-09 MED ORDER — SODIUM CHLORIDE 0.9 % IV SOLN
INTRAVENOUS | Status: DC
  Administered 2014-04-09: 75 mL/h via INTRAVENOUS

## 2014-04-09 MED ORDER — ASPIRIN 325 MG PO TABS
325.0000 mg | ORAL_TABLET | Freq: Every day | ORAL | Status: DC
  Administered 2014-04-09: 325 mg via ORAL
  Filled 2014-04-09: qty 1

## 2014-04-09 MED ORDER — ENOXAPARIN SODIUM 40 MG/0.4ML ~~LOC~~ SOLN
40.0000 mg | SUBCUTANEOUS | Status: DC
  Administered 2014-04-09: 40 mg via SUBCUTANEOUS
  Filled 2014-04-09: qty 0.4

## 2014-04-09 MED ORDER — INSULIN GLARGINE 100 UNIT/ML ~~LOC~~ SOLN
10.0000 [IU] | Freq: Every day | SUBCUTANEOUS | Status: DC
  Administered 2014-04-09 – 2014-04-12 (×4): 10 [IU] via SUBCUTANEOUS
  Filled 2014-04-09 (×7): qty 0.1

## 2014-04-09 MED ORDER — ASPIRIN EC 81 MG PO TBEC
81.0000 mg | DELAYED_RELEASE_TABLET | Freq: Every day | ORAL | Status: DC
  Administered 2014-04-10 – 2014-04-13 (×3): 81 mg via ORAL
  Filled 2014-04-09 (×4): qty 1

## 2014-04-09 MED ORDER — PANTOPRAZOLE SODIUM 40 MG PO TBEC
40.0000 mg | DELAYED_RELEASE_TABLET | Freq: Every day | ORAL | Status: DC
  Administered 2014-04-09 – 2014-04-13 (×4): 40 mg via ORAL
  Filled 2014-04-09 (×4): qty 1

## 2014-04-09 MED ORDER — ENOXAPARIN SODIUM 40 MG/0.4ML ~~LOC~~ SOLN
40.0000 mg | SUBCUTANEOUS | Status: DC
  Administered 2014-04-11: 40 mg via SUBCUTANEOUS
  Filled 2014-04-09 (×2): qty 0.4

## 2014-04-09 MED ORDER — SODIUM CHLORIDE 0.9 % IV SOLN
INTRAVENOUS | Status: DC
  Administered 2014-04-09 – 2014-04-10 (×3): via INTRAVENOUS

## 2014-04-09 MED ORDER — HYDRALAZINE HCL 20 MG/ML IJ SOLN
5.0000 mg | Freq: Four times a day (QID) | INTRAMUSCULAR | Status: DC | PRN
  Administered 2014-04-09: 5 mg via INTRAVENOUS
  Filled 2014-04-09: qty 1

## 2014-04-09 MED ORDER — ALPRAZOLAM 0.5 MG PO TABS
0.5000 mg | ORAL_TABLET | Freq: Three times a day (TID) | ORAL | Status: DC | PRN
  Administered 2014-04-09 (×2): 0.5 mg via ORAL
  Filled 2014-04-09 (×3): qty 1

## 2014-04-09 MED ORDER — STROKE: EARLY STAGES OF RECOVERY BOOK
Freq: Once | Status: AC
  Administered 2014-04-09: 06:00:00
  Filled 2014-04-09: qty 1

## 2014-04-09 MED ORDER — INSULIN ASPART 100 UNIT/ML ~~LOC~~ SOLN
0.0000 [IU] | Freq: Three times a day (TID) | SUBCUTANEOUS | Status: DC
  Administered 2014-04-09 (×2): 3 [IU] via SUBCUTANEOUS
  Administered 2014-04-09: 11 [IU] via SUBCUTANEOUS
  Administered 2014-04-10: 3 [IU] via SUBCUTANEOUS
  Administered 2014-04-10: 5 [IU] via SUBCUTANEOUS
  Administered 2014-04-11: 3 [IU] via SUBCUTANEOUS
  Administered 2014-04-11: 2 [IU] via SUBCUTANEOUS
  Administered 2014-04-11 – 2014-04-13 (×3): 3 [IU] via SUBCUTANEOUS

## 2014-04-09 MED ORDER — NORTRIPTYLINE HCL 25 MG PO CAPS
25.0000 mg | ORAL_CAPSULE | Freq: Every day | ORAL | Status: DC
  Administered 2014-04-09 – 2014-04-12 (×4): 25 mg via ORAL
  Filled 2014-04-09 (×5): qty 1

## 2014-04-09 MED ORDER — HYDRALAZINE HCL 20 MG/ML IJ SOLN: 5.0000 mg | Freq: Four times a day (QID) | INTRAMUSCULAR | Status: DC | PRN

## 2014-04-09 MED ORDER — GLYBURIDE 5 MG PO TABS
5.0000 mg | ORAL_TABLET | Freq: Every day | ORAL | Status: DC
  Administered 2014-04-09 – 2014-04-13 (×3): 5 mg via ORAL
  Filled 2014-04-09 (×6): qty 1

## 2014-04-09 MED ORDER — ATORVASTATIN CALCIUM 80 MG PO TABS
80.0000 mg | ORAL_TABLET | Freq: Every day | ORAL | Status: DC
  Administered 2014-04-10 – 2014-04-12 (×3): 80 mg via ORAL
  Filled 2014-04-09 (×3): qty 1

## 2014-04-09 MED ORDER — SIMVASTATIN 40 MG PO TABS
40.0000 mg | ORAL_TABLET | Freq: Every day | ORAL | Status: DC
  Administered 2014-04-09: 40 mg via ORAL
  Filled 2014-04-09: qty 1

## 2014-04-09 MED ORDER — SERTRALINE HCL 50 MG PO TABS
50.0000 mg | ORAL_TABLET | Freq: Every day | ORAL | Status: DC
  Administered 2014-04-09 – 2014-04-12 (×4): 50 mg via ORAL
  Filled 2014-04-09 (×4): qty 1

## 2014-04-09 NOTE — H&P (Addendum)
[facility] Admission History and Physical       Peter Nicholson ZGY:[id number] DOB: Jul 07, 1940 DOA: 04/08/2014  Referring physician: EDP PCP: Noralee Space, MD  Specialists:   Chief Complaint: Confusion, Trouble Speaking and Walking  HPI: Peter Nicholson is a 73 y.o. male with a history of TIA, DM2, HTN, and hyperlipidemia and remote Hx of CVA, and RCCa S/P Nephrectomy in 1997 who presents to the ED with complaints of confusion, difficulty speaking and ataxia since Saturday.    His family is at the bedside and report that he has had anomia and slurring of his speech.   He was found to have a CT scan of the Brain which was negatie for acute findings, and his labs reveal a glucose of 666.   His family report that he takes his medications, but he does not check his glucose levels because they found his glucometer and it was collecting dust.    He was administered IV Novolog x 1 in the ED and his glucose level decreased to 377, and his labs do not reveal DKA at this time.     Review of Systems:  Constitutional: No Weight Loss, No Weight Gain, Night Sweats, Fevers, Chills, Dizziness, Fatigue, or Generalized Weakness HEENT: No Headaches, Difficulty Swallowing,Tooth/Dental Problems,Sore Throat,  No Sneezing, Rhinitis, Ear Ache, Nasal Congestion, or Post Nasal Drip,  Cardio-vascular:  No Chest pain, Orthopnea, PND, Edema in Lower Extremities, Anasarca, Dizziness, Palpitations  Resp: No Dyspnea, No DOE, No Productive Cough, No Non-Productive Cough, No Hemoptysis, No Wheezing.    GI: No Heartburn, Indigestion, Abdominal Pain, Nausea, Vomiting, Diarrhea, Hematemesis, Hematochezia, Melena, Change in Bowel Habits,  Loss of Appetite  GU: No Dysuria, Change in Color of Urine, No Urgency or Frequency, No Flank pain.  Musculoskeletal: No Joint Pain or Swelling, No Decreased Range of Motion, No Back Pain.  Neurologic: No Syncope, No Seizures, Muscle Weakness, Paresthesia, Vision Disturbance or Loss,  No Diplopia, No Vertigo, No Difficulty Walking,  Skin: No Rash or Lesions. Psych: No Change in Mood or Affect, No Depression or Anxiety, No Memory loss, No Confusion, or Hallucinations   Past Medical History  Diagnosis Date  . Obstructive sleep apnea (adult) (pediatric)     doesnt wear CPAP  . HTN (hypertension)   . Cerebrovascular disease, unspecified   . Unspecified venous (peripheral) insufficiency   . Pure hypercholesterolemia   . Type II or unspecified type diabetes mellitus without mention of complication, not stated as uncontrolled   . Overweight(278.02)   . Diaphragmatic hernia without mention of obstruction or gangrene   . Esophageal reflux   . Diverticulosis of colon (without mention of hemorrhage)   . Other specified congenital anomaly of kidney   . Kidney carcinoma   . Calculus of kidney   . Gout, unspecified   . Lumbago   . Anxiety state, unspecified   . History of gallstones   . IBS (irritable bowel syndrome)   . History of seizures   . Bowel obstruction   . Osteoarthrosis, unspecified whether generalized or localized, unspecified site     tendonitis  . Blood transfusion without reported diagnosis   . Stroke     2001  . Seizures     last seizure November 2012      Past Surgical History  Procedure Laterality Date  . Incisional hernia repair  1/96    Dr.Leone  . Nasal sinus surgery  12/96    Dr.Redman  . Cholecystectomy  9/04    Dr.  Leone  . 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 Carotid endarterectomy  09/2000    Dr.Lawson  . Veterbral art angioplasty      x2  . Small intestine surgery    . External fixation leg  03/10/2011    Procedure: EXTERNAL FIXATION LEG;  Surgeon: Rozanna Box;  Location: [facility];  Service: Orthopedics;  Laterality: Right;  . Orif tibia plateau  03/24/2011    Procedure: OPEN REDUCTION INTERNAL FIXATION (ORIF) TIBIAL PLATEAU;  Surgeon: Rozanna Box;  Location: [facility];  Service: Orthopedics;   Laterality: Right;       Prior to Admission medications   Medication Sig Start Date End Date Taking? Authorizing Provider  allopurinol (ZYLOPRIM) 300 MG tablet TAKE 1 TABLET (300 MG TOTAL) BY MOUTH DAILY.   Yes Tammy S Parrett, NP  ALPRAZolam (XANAX) 0.5 MG tablet TAKE ONE-HALF TO ONE TABLET BY MOUTH THREE TIMES DAILY AS NEEDED FOR NERVES 11/01/13  Yes Noralee Space, MD  aspirin EC 81 MG EC tablet Take 1 tablet (81 mg total) by mouth daily. 02/28/14  Yes Shanker Kristeen Mans, MD  Blood Glucose Monitoring Suppl (ONE TOUCH ULTRA SYSTEM KIT) W/DEVICE KIT Test blood sugar up to 3 times daily 07/28/13  Yes Noralee Space, MD  clopidogrel (PLAVIX) 75 MG tablet Take 1 tablet (75 mg total) by mouth daily. 02/28/14  Yes Shanker Kristeen Mans, MD  glucose blood test strip Test blood sugar up to 3 times daily 07/28/13  Yes Noralee Space, MD  glyBURIDE (DIABETA) 5 MG tablet Take 5 mg by mouth daily with breakfast.    Yes Historical Provider, MD  Insulin Glargine (LANTUS SOLOSTAR) 100 UNIT/ML Solostar Pen Inject 10 Units into the skin at bedtime. 08/30/13  Yes Noralee Space, MD  Insulin Pen Needle 31G X 8 MM MISC Use with Lantus Solostar Pen once daily as directed 01/19/13  Yes Tammy S Parrett, NP  lisinopril (PRINIVIL,ZESTRIL) 5 MG tablet Take 1 tablet (5 mg total) by mouth daily. 02/28/14  Yes Shanker Kristeen Mans, MD  metFORMIN (GLUCOPHAGE) 500 MG tablet Take 2 tablets by mouth two times daily 03/01/14  Yes Shanker Kristeen Mans, MD  nortriptyline (PAMELOR) 25 MG capsule Take 25 mg by mouth at bedtime.   Yes Historical Provider, MD  pantoprazole (PROTONIX) 40 MG tablet TAKE 1 TABLET (40 MG TOTAL) BY MOUTH DAILY AT 12 NOON. 04/10/13  Yes Noralee Space, MD  saxagliptin HCl (ONGLYZA) 5 MG TABS tablet Take 1 tablet (5 mg total) by mouth daily. 05/15/11  Yes Noralee Space, MD  sertraline (ZOLOFT) 50 MG tablet Take 1 tablet (50 mg total) by mouth at bedtime. 03/02/14  Yes Noralee Space, MD  simvastatin (ZOCOR) 40 MG tablet TAKE ONE  TABLET BY MOUTH AT BEDTIME 05/24/13  Yes Noralee Space, MD  traMADol (ULTRAM) 50 MG tablet Take 50 mg by mouth every 6 (six) hours as needed for pain.  07/08/12  Yes Historical Provider, MD      Allergies  Allergen Reactions  . Dilaudid [Hydromorphone Hcl] Other (See Comments)    hallucinations  . Fentanyl Other (See Comments)    hallucinations     Social History:  reports that he has never smoked. He has never used smokeless tobacco. He reports that he drinks alcohol. He reports that he does not use illicit drugs.     Family History  Problem Relation Age of Onset  . Heart  attack Father   . Dementia Mother   . Ovarian cancer Paternal Grandmother   . Breast cancer Sister   . Colon cancer Neg Hx   . Esophageal cancer Neg Hx   . Rectal cancer Neg Hx   . Stomach cancer Neg Hx        Physical Exam:  GEN:  Pleasant Well Nourished and Well Developed Elderly 73 y.o. Caucasian male examined and in no acute distress; cooperative with exam Filed Vitals:   04/08/14 2145 04/08/14 2315 04/09/14 0015 04/09/14 0100  BP: 148/79 156/54 177/73 118/75  Pulse:  93 65 99  Temp:      TempSrc:      Resp: _0 Height:      Weight:      SpO2:  99% 99% 95%   Blood pressure 118/75, pulse 99, temperature 98.1 F (36.7 C), temperature source Oral, resp. rate 27, height 5' 7" (1.702 m), weight 83.915 kg (185 lb), SpO2 95 %. PSYCH: He is alert and oriented x4; does not appear anxious does not appear depressed; affect is normal HEENT: Normocephalic and Atraumatic, Mucous membranes pink; PERRLA; EOM intact; Fundi:  Benign;  No scleral icterus, Nares: Patent, Oropharynx: Clear, Fair Dentition,    Neck:  FROM, No Cervical Lymphadenopathy nor Thyromegaly or Carotid Bruit; No JVD; Breasts:: Not examined CHEST WALL: No tenderness CHEST: Normal respiration, clear to auscultation bilaterally HEART: Regular rate and rhythm; no murmurs rubs or gallops BACK: No kyphosis or scoliosis; No CVA  tenderness ABDOMEN: Positive Bowel Sounds, Soft Non-Tender; No Masses, No Organomegaly Rectal Exam: Not done EXTREMITIES: No Cyanosis, Clubbing, or Edema; No Ulcerations. Genitalia: not examined PULSES: 2+ and symmetric SKIN: Normal hydration no rash or ulceration  CNS:   Mental Status:  Alert, Oriented, Thought Content Appropriate. Speech Fluent without evidence of Aphasia. Able to follow 3 step commands without difficulty.  In No obvious pain.   Cranial Nerves:  II: Discs flat bilaterally; Visual fields Intact, Pupils equal and reactive.    III,IV, VI: Extra-ocular motions intact bilaterally    V,VII: smile symmetric, facial light touch sensation normal bilaterally    VIII: hearing intact bilaterally    IX,X: gag reflex present    XI: bilateral shoulder shrug    XII: midline tongue extension   Motor:  Right:  Upper extremity 5/5     Left:  Upper extremity 5/5     Right:  Lower extremity 5/5    Left:  Lower extremity 5/5     Tone and Bulk:  normal tone throughout; no atrophy noted   Sensory:  Pinprick and light touch intact throughout, bilaterally   Deep Tendon Reflexes: 2+ and symmetric throughout   Plantars/ Babinski:  Right:normal Left: normal    Cerebellar:  Finger to nose with difficulty in Right    Gait: deferred   Vascular: pulses palpable throughout    Labs on Admission:  Basic Metabolic Panel:  Recent Labs Lab 04/08/14 2143 04/08/14 2151  NA 130* 134*  K 3.9 4.0  CL 98 100  CO2 22  --   GLUCOSE 663* 665*  BUN 21 27*  CREATININE 1.65* 1.40*  CALCIUM 9.0  --    Liver Function Tests:  Recent Labs Lab 04/08/14 2143  AST 23  ALT 19  ALKPHOS 95  BILITOT 0.4  PROT 6.7  ALBUMIN 3.6   No results for input(s): LIPASE, AMYLASE in the last 168 hours. No results for input(s): AMMONIA in the last 168 hours. CBC:  Recent Labs Lab 04/08/14 2143 04/08/14 2151  WBC 9.3  --   NEUTROABS 7.4  --   HGB 13.3 15.0  HCT 40.7 44.0  MCV 95.8  --   PLT 250   --    Cardiac Enzymes: No results for input(s): CKTOTAL, CKMB, CKMBINDEX, TROPONINI in the last 168 hours.  BNP (last 3 results) No results for input(s): PROBNP in the last 8760 hours. CBG:  Recent Labs Lab 04/08/14 2345  GLUCAP 377*    Radiological Exams on Admission: Dg Chest 2 View  04/08/2014   CLINICAL DATA:  Altered mental status. Stroke 3 weeks ago. Speech problems and difficulty walking since yesterday.  EXAM: CHEST  2 VIEW  COMPARISON:  03/10/2011  FINDINGS: Heart and mediastinal contours are within normal limits. No focal opacities or effusions. No acute bony abnormality.  IMPRESSION: No active cardiopulmonary disease.   Electronically Signed   By: Rolm Baptise [name]   On: 04/08/2014 22:29   Ct Head (brain) Wo Contrast  04/08/2014   CLINICAL DATA:  Speech and difficulty walking since yesterday. History of stroke 3 weeks ago.  EXAM: CT HEAD WITHOUT CONTRAST  TECHNIQUE: Contiguous axial images were obtained from the base of the skull through the vertex without intravenous contrast.  COMPARISON:  02/26/2014  FINDINGS: Punctate low-density areas noted throughout the left cerebral hemisphere, likely related to the previously seen left cerebral infarcts by previous MRI.  Old infarcts in the right cerebellum. No acute infarct. No hemorrhage or hydrocephalus. No mass lesion or midline shift.  No acute calvarial abnormality. Visualized paranasal sinuses and mastoids clear. Orbital soft tissues unremarkable.  IMPRESSION: Punctate low-density areas throughout the left cerebral hemisphere corresponding to the areas of infarct seen on previous/recent MRI. No acute infarction.   Electronically Signed   By: Rolm Baptise [name]   On: 04/08/2014 22:18     EKG: Independently reviewed. Sinus Tachycardia rate 118 No Acute S-T changes    Assessment/Plan:   73 y.o. male with  Principal Problem:   1.   TIA (transient ischemic attack)- rule out CVA   Telemetry Monitoring   TIA Workup   Neuro Checks       MRI/MRA Brain in AM    Had Carotid US and 2D ECHO on 02/26/2014   Fasting Lipids and HbA1C in AM     ASA Rx   Active Problems:   2.   Confusion- Due to Hyperglycemia or Metabolic Cause    Monitor       3.   HYPERCHOLESTEROLEMIA   Continue Simvastatin Rx   Check Lipids in AM     4.   Essential hypertension   Continue Lisinopril    Monitor BPs    5.   Hyperglycemia and /Diabetes mellitus type II, uncontrolled   Continue Lantus and Glyburide Rx but hold if Glucose < 100   Hold Onglyza, and Metformin for now   SSI coverage PRN   Check  HbA1C    6.   Pyuria-     Urine C+S sent     7.   DVT Prophylaxis   Lovenox     Code Status:   FULL CODE    Family Communication:    Family at Bedside Disposition Plan:     Observation Telemetry Bed    Time spent:  [place] [facility] Pager [phone]   If [place] Please Contact the Day Rounding Team MD for [facility]  If 7PM-7AM, Please Contact Night-Floor Coverage  [email or web address] Password [email or web address] 04/09/2014, 1:43 AM     ADDENDUM:  Discussed changes in speech when arrived to floor with neurology ( Dr. Janann Colonel) and Neurology consulted.   Continue current plan of care.

## 2014-04-09 NOTE — Progress Notes (Signed)
[facility] PROGRESS NOTE  Peter Nicholson OJJ:[id number] DOB: Dec 10, 1940 DOA: 04/08/2014 PCP: Noralee Space, MD  Assessment/Plan: 1-Multiple acute nonhemorrhagic infarct left cerebral hemisphere:  Had Carotid US and 2D ECHO on 02/26/2014. Plan for angiography this admission.  LDL 113, on statins.   Encephalopathy: in the event of acute stroke.   Diabetes; Continue with lantus and SSI.   Acute on chronic renal diseases; stage 2  Continue with IV fluids.  Repeat renal function in am.   Hypertension:  PRN hydralazine for SBP more than 180.   Hyperlipidemia;  On statin.  LDL 113.   Pyuria; follow urine culture.   S/P nephrectomy:   Code Status: Full Code.  Family Communication: care discussed with patient Disposition Plan: remain inpatient   Consultants:  Neurology  Procedures: Had Carotid US and 2D ECHO on 02/26/2014  Antibiotics:  none  HPI/Subjective: Patient with expressive aphasia.  No complaints.    Objective: Filed Vitals:   04/09/14 0700  BP: 146/76  Pulse:   Temp: 98.5 F (36.9 C)  Resp: 20   No intake or output data in the 24 hours ending 04/09/14 0830 Filed Weights   04/08/14 2051 04/09/14 0258  Weight: 83.915 kg (185 lb) 81.874 kg (180 lb 8 oz)    Exam:   General:  Alert in no distress.   Cardiovascular: S 1, S 2 RRR  Respiratory: CTA  Abdomen: bs present, soft, nt  Musculoskeletal: no edema.   Neuro exam: Alert , not oriented, confuse, expressive aphasia. Motor strength 5-5  Data Reviewed: Basic Metabolic Panel:  Recent Labs Lab 04/08/14 2143 04/08/14 2151  NA 130* 134*  K 3.9 4.0  CL 98 100  CO2 22  --   GLUCOSE 663* 665*  BUN 21 27*  CREATININE 1.65* 1.40*  CALCIUM 9.0  --    Liver Function Tests:  Recent Labs Lab 04/08/14 2143  AST 23  ALT 19  ALKPHOS 95  BILITOT 0.4  PROT 6.7  ALBUMIN 3.6   No results for input(s): LIPASE, AMYLASE in the last 168 hours. No results for input(s): AMMONIA in  the last 168 hours. CBC:  Recent Labs Lab 04/08/14 2143 04/08/14 2151  WBC 9.3  --   NEUTROABS 7.4  --   HGB 13.3 15.0  HCT 40.7 44.0  MCV 95.8  --   PLT 250  --    Cardiac Enzymes: No results for input(s): CKTOTAL, CKMB, CKMBINDEX, TROPONINI in the last 168 hours. BNP (last 3 results) No results for input(s): PROBNP in the last 8760 hours. CBG:  Recent Labs Lab 04/08/14 2345 04/09/14 0333 04/09/14 0722  GLUCAP 377* 270* 336*    No results found for this or any previous visit (from the past 240 hour(s)).   Studies: Dg Chest 2 View  04/08/2014   CLINICAL DATA:  Altered mental status. Stroke 3 weeks ago. Speech problems and difficulty walking since yesterday.  EXAM: CHEST  2 VIEW  COMPARISON:  03/10/2011  FINDINGS: Heart and mediastinal contours are within normal limits. No focal opacities or effusions. No acute bony abnormality.  IMPRESSION: No active cardiopulmonary disease.   Electronically Signed   By: Rolm Baptise [name]   On: 04/08/2014 22:29   Ct Head (brain) Wo Contrast  04/08/2014   CLINICAL DATA:  Speech and difficulty walking since yesterday. History of stroke 3 weeks ago.  EXAM: CT HEAD WITHOUT CONTRAST  TECHNIQUE: Contiguous axial images were obtained from the base of the skull through the vertex without intravenous  contrast.  COMPARISON:  02/26/2014  FINDINGS: Punctate low-density areas noted throughout the left cerebral hemisphere, likely related to the previously seen left cerebral infarcts by previous MRI.  Old infarcts in the right cerebellum. No acute infarct. No hemorrhage or hydrocephalus. No mass lesion or midline shift.  No acute calvarial abnormality. Visualized paranasal sinuses and mastoids clear. Orbital soft tissues unremarkable.  IMPRESSION: Punctate low-density areas throughout the left cerebral hemisphere corresponding to the areas of infarct seen on previous/recent MRI. No acute infarction.   Electronically Signed   By: Rolm Baptise [name]   On:  04/08/2014 22:18   Mri Brain Without Contrast  04/09/2014   CLINICAL DATA:  Initial evaluation for 2-3 days of speech difficulty and gait instability. Recent stroke.  EXAM: MRI HEAD WITHOUT CONTRAST  MRA HEAD WITHOUT CONTRAST  TECHNIQUE: Multiplanar, multiecho pulse sequences of the brain and surrounding structures were obtained without intravenous contrast. Angiographic images of the head were obtained using MRA technique without contrast.  COMPARISON:  Prior CT from 04/08/2014 as well as previous MRI from 02/26/2014  FINDINGS: MRI HEAD FINDINGS  Multiple acute nonhemorrhagic ischemic infarcts seen scattered throughout the left cerebral hemisphere, involving the left frontal and parietal lobes, as well as the left temporal lobe at the temporoparietal junction. And left temporal lobes. These involve both the cortical gray matter and deep white matter of the left centrum semi ovale. No associated hemorrhage or significant mass effect. No intracranial hemorrhage.  Remote bilateral cerebellar infarcts again noted, right greater than left. Remote infarct within the right thalamus also again noted. Chronic small vessel ischemic changes again noted, stable.  No mass lesion or midline shift.  No hydrocephalus.  Craniocervical junction within normal limits. Degenerative changes noted within the upper cervical spine. Pituitary gland normal. No acute abnormality seen about the orbits.  Paranasal sinuses clear.  No mastoid effusion.  MRA HEAD FINDINGS  ANTERIOR CIRCULATION:  Again, the left internal carotid artery is occluded. There is some reconstitution at the left supra clinoid ICA, likely via collateral flow cross the circle of Willis. Flow within the left middle cerebral artery and distal MCA branches is markedly attenuated and diminutive as compared to the contralateral right. This finding is similar relative to prior study.  Visualized distal cervical segment of the internal carotid artery widely patent with  antegrade flow. Petrous segment widely patent. There is short-segment moderate to severe stenosis of the cavernous segment of the right ICA, also stable. Supra clinoid right ICA widely patent.  Right A1 segment is widely patent. The left A1 segment is poorly opacified, and may be hypoplastic or containing high-grade stenosis. Anterior communicating artery grossly normal. The A2 segments are widely patent bilaterally.  POSTERIOR CIRCULATION:  The right vertebral artery is occluded. Right posterior inferior cerebral artery not well evaluated, likely occluded as well. Multi focal stenoses, 1 of which is high-grade present within the distal left vertebral artery. B basilar artery is moderately marrow proximally.  Left P1 and P2 segments are widely patent. Right P1 segment not well visualized. The distal right PCA is not visualized as well, and may be occluded.  No aneurysm or vascular malformation.  IMPRESSION: MRI HEAD IMPRESSION:  1. Multi focal nonhemorrhagic ischemic infarcts involving the left frontal and parietal lobes as well as the posterior left temporal lobe at the temporoparietal junction. No significant mass effect. 2. Stable appearance of remote bilateral cerebellar infarcts and remote lacunar infarct within the right thalamus. 3. Atrophy with chronic microvascular ischemic disease.  MRA  HEAD IMPRESSION:  1. Occluded left internal carotid artery. There is some distal reconstitution at the supra clinoid left ICA, however, this is likely inadequate as flow within the left middle cerebral artery and its branches is markedly attenuated. 2. Focal moderate to severe stenosis within the cavernous right ICA. 3. Occluded right vertebral artery. 4. Multi focal moderate to severe stenosis within the distal left vertebral artery with moderate narrowing of the proximal basilar artery. 5. Poor visualization of the right P1 segment, with nonvisualization of the distal right P2 segment, which may be occluded. 6. Overall,  these findings are not significantly changed relative to prior MRA from 02/26/2014.   Electronically Signed   By: Jeannine Boga [name]   On: 04/09/2014 06:41   Mr Jodene Nam Head/brain Wo Cm  04/09/2014   CLINICAL DATA:  Initial evaluation for 2-3 days of speech difficulty and gait instability. Recent stroke.  EXAM: MRI HEAD WITHOUT CONTRAST  MRA HEAD WITHOUT CONTRAST  TECHNIQUE: Multiplanar, multiecho pulse sequences of the brain and surrounding structures were obtained without intravenous contrast. Angiographic images of the head were obtained using MRA technique without contrast.  COMPARISON:  Prior CT from 04/08/2014 as well as previous MRI from 02/26/2014  FINDINGS: MRI HEAD FINDINGS  Multiple acute nonhemorrhagic ischemic infarcts seen scattered throughout the left cerebral hemisphere, involving the left frontal and parietal lobes, as well as the left temporal lobe at the temporoparietal junction. And left temporal lobes. These involve both the cortical gray matter and deep white matter of the left centrum semi ovale. No associated hemorrhage or significant mass effect. No intracranial hemorrhage.  Remote bilateral cerebellar infarcts again noted, right greater than left. Remote infarct within the right thalamus also again noted. Chronic small vessel ischemic changes again noted, stable.  No mass lesion or midline shift.  No hydrocephalus.  Craniocervical junction within normal limits. Degenerative changes noted within the upper cervical spine. Pituitary gland normal. No acute abnormality seen about the orbits.  Paranasal sinuses clear.  No mastoid effusion.  MRA HEAD FINDINGS  ANTERIOR CIRCULATION:  Again, the left internal carotid artery is occluded. There is some reconstitution at the left supra clinoid ICA, likely via collateral flow cross the circle of Willis. Flow within the left middle cerebral artery and distal MCA branches is markedly attenuated and diminutive as compared to the contralateral right.  This finding is similar relative to prior study.  Visualized distal cervical segment of the internal carotid artery widely patent with antegrade flow. Petrous segment widely patent. There is short-segment moderate to severe stenosis of the cavernous segment of the right ICA, also stable. Supra clinoid right ICA widely patent.  Right A1 segment is widely patent. The left A1 segment is poorly opacified, and may be hypoplastic or containing high-grade stenosis. Anterior communicating artery grossly normal. The A2 segments are widely patent bilaterally.  POSTERIOR CIRCULATION:  The right vertebral artery is occluded. Right posterior inferior cerebral artery not well evaluated, likely occluded as well. Multi focal stenoses, 1 of which is high-grade present within the distal left vertebral artery. B basilar artery is moderately marrow proximally.  Left P1 and P2 segments are widely patent. Right P1 segment not well visualized. The distal right PCA is not visualized as well, and may be occluded.  No aneurysm or vascular malformation.  IMPRESSION: MRI HEAD IMPRESSION:  1. Multi focal nonhemorrhagic ischemic infarcts involving the left frontal and parietal lobes as well as the posterior left temporal lobe at the temporoparietal junction. No significant mass effect.  2. Stable appearance of remote bilateral cerebellar infarcts and remote lacunar infarct within the right thalamus. 3. Atrophy with chronic microvascular ischemic disease.  MRA HEAD IMPRESSION:  1. Occluded left internal carotid artery. There is some distal reconstitution at the supra clinoid left ICA, however, this is likely inadequate as flow within the left middle cerebral artery and its branches is markedly attenuated. 2. Focal moderate to severe stenosis within the cavernous right ICA. 3. Occluded right vertebral artery. 4. Multi focal moderate to severe stenosis within the distal left vertebral artery with moderate narrowing of the proximal basilar artery. 5.  Poor visualization of the right P1 segment, with nonvisualization of the distal right P2 segment, which may be occluded. 6. Overall, these findings are not significantly changed relative to prior MRA from 02/26/2014.   Electronically Signed   By: Jeannine Boga [name]   On: 04/09/2014 06:41    Scheduled Meds: . allopurinol  300 mg Oral Daily  . aspirin  325 mg Oral Daily  . clopidogrel  75 mg Oral Daily  . enoxaparin (LOVENOX) injection  40 mg Subcutaneous Q24H  . glyBURIDE  5 mg Oral Q breakfast  . insulin aspart  0-15 Units Subcutaneous TID WC  . insulin glargine  10 Units Subcutaneous QHS  . lisinopril  5 mg Oral Daily  . nortriptyline  25 mg Oral QHS  . pantoprazole  40 mg Oral Daily  . sertraline  50 mg Oral QHS  . simvastatin  40 mg Oral q1800   Continuous Infusions: . sodium chloride 75 mL/hr (04/09/14 0216)    Principal Problem:   TIA (transient ischemic attack) Active Problems:   HYPERCHOLESTEROLEMIA   Essential hypertension   Hyperglycemia   Confusion   Diabetes mellitus type II, uncontrolled    Time spent: 35 minutes.     Niel Hummer A  [facility] Pager [phone]. If 7PM-7AM, please contact night-coverage at [email or web address], password [email or web address] 04/09/2014, 8:30 AM  LOS: 1 day

## 2014-04-09 NOTE — ED Provider Notes (Signed)
CSN: [id number]     Arrival date & time 04/08/14  2040 History   First MD Initiated Contact with Patient 04/08/14 2125     Chief Complaint  Patient presents with  . Stroke Symptoms    level V caveat due to altered mental status.  (Consider location/radiation/quality/duration/timing/severity/associated sxs/prior Treatment) The history is provided by the patient and a relative.   patient presents with a couple days of altered mental status. Some difficulty speaking. I was initially called to his car to bring him in because he did not want to come in to the hospital. He is confused and unable to give much history. He is alert to self and location, but states the date as his birthday. He is not able to recognize his family members. Recent stroke with admission that  caused some difficulty speaking.  Past Medical History  Diagnosis Date  . Obstructive sleep apnea (adult) (pediatric)     doesnt wear CPAP  . HTN (hypertension)   . Cerebrovascular disease, unspecified   . Unspecified venous (peripheral) insufficiency   . Pure hypercholesterolemia   . Type II or unspecified type diabetes mellitus without mention of complication, not stated as uncontrolled   . Overweight(278.02)   . Diaphragmatic hernia without mention of obstruction or gangrene   . Esophageal reflux   . Diverticulosis of colon (without mention of hemorrhage)   . Other specified congenital anomaly of kidney   . Kidney carcinoma   . Calculus of kidney   . Gout, unspecified   . Lumbago   . Anxiety state, unspecified   . History of gallstones   . IBS (irritable bowel syndrome)   . History of seizures   . Bowel obstruction   . Osteoarthrosis, unspecified whether generalized or localized, unspecified site     tendonitis  . Blood transfusion without reported diagnosis   . Stroke     2001  . Seizures     last seizure November 2012   Past Surgical History  Procedure Laterality Date  . Incisional hernia repair  1/96   Dr.Leone  . Nasal sinus surgery  12/96    Dr.Redman  . Cholecystectomy  9/04    Dr. Rebekah Chesterfield  . 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 Carotid endarterectomy  09/2000    Dr.Lawson  . Veterbral art angioplasty      x2  . Small intestine surgery    . External fixation leg  03/10/2011    Procedure: EXTERNAL FIXATION LEG;  Surgeon: Rozanna Box;  Location: [place];  Service: Orthopedics;  Laterality: Right;  . Orif tibia plateau  03/24/2011    Procedure: OPEN REDUCTION INTERNAL FIXATION (ORIF) TIBIAL PLATEAU;  Surgeon: Rozanna Box;  Location: [place];  Service: Orthopedics;  Laterality: Right;   Family History  Problem Relation Age of Onset  . Heart attack Father   . Dementia Mother   . Ovarian cancer Paternal Grandmother   . Breast cancer Sister   . Colon cancer Neg Hx   . Esophageal cancer Neg Hx   . Rectal cancer Neg Hx   . Stomach cancer Neg Hx    History  Substance Use Topics  . Smoking status: Never Smoker   . Smokeless tobacco: Never Used  . Alcohol Use: Yes     Comment: Very occasional use    Review of Systems  Unable to perform ROS     Allergies  Dilaudid and Fentanyl  Home Medications  Prior to Admission medications   Medication Sig Start Date End Date Taking? Authorizing Provider  allopurinol (ZYLOPRIM) 300 MG tablet TAKE 1 TABLET (300 MG TOTAL) BY MOUTH DAILY.   Yes Tammy S Parrett, NP  ALPRAZolam (XANAX) 0.5 MG tablet TAKE ONE-HALF TO ONE TABLET BY MOUTH THREE TIMES DAILY AS NEEDED FOR NERVES 11/01/13  Yes Noralee Space, MD  aspirin EC 81 MG EC tablet Take 1 tablet (81 mg total) by mouth daily. 02/28/14  Yes Shanker Kristeen Mans, MD  Blood Glucose Monitoring Suppl (ONE TOUCH ULTRA SYSTEM KIT) W/DEVICE KIT Test blood sugar up to 3 times daily 07/28/13  Yes Noralee Space, MD  clopidogrel (PLAVIX) 75 MG tablet Take 1 tablet (75 mg total) by mouth daily. 02/28/14  Yes Shanker Kristeen Mans, MD  glucose blood test strip Test blood sugar  up to 3 times daily 07/28/13  Yes Noralee Space, MD  glyBURIDE (DIABETA) 5 MG tablet Take 5 mg by mouth daily with breakfast.    Yes Historical Provider, MD  Insulin Glargine (LANTUS SOLOSTAR) 100 UNIT/ML Solostar Pen Inject 10 Units into the skin at bedtime. 08/30/13  Yes Noralee Space, MD  Insulin Pen Needle 31G X 8 MM MISC Use with Lantus Solostar Pen once daily as directed 01/19/13  Yes Tammy S Parrett, NP  lisinopril (PRINIVIL,ZESTRIL) 5 MG tablet Take 1 tablet (5 mg total) by mouth daily. 02/28/14  Yes Shanker Kristeen Mans, MD  metFORMIN (GLUCOPHAGE) 500 MG tablet Take 2 tablets by mouth two times daily 03/01/14  Yes Shanker Kristeen Mans, MD  nortriptyline (PAMELOR) 25 MG capsule Take 25 mg by mouth at bedtime.   Yes Historical Provider, MD  pantoprazole (PROTONIX) 40 MG tablet TAKE 1 TABLET (40 MG TOTAL) BY MOUTH DAILY AT 12 NOON. 04/10/13  Yes Noralee Space, MD  saxagliptin HCl (ONGLYZA) 5 MG TABS tablet Take 1 tablet (5 mg total) by mouth daily. 05/15/11  Yes Noralee Space, MD  sertraline (ZOLOFT) 50 MG tablet Take 1 tablet (50 mg total) by mouth at bedtime. 03/02/14  Yes Noralee Space, MD  simvastatin (ZOCOR) 40 MG tablet TAKE ONE TABLET BY MOUTH AT BEDTIME 05/24/13  Yes Noralee Space, MD  traMADol (ULTRAM) 50 MG tablet Take 50 mg by mouth every 6 (six) hours as needed for pain.  07/08/12  Yes Historical Provider, MD   BP 177/73 mmHg  Pulse 65  Temp(Src) 98.1 F (36.7 C) (Oral)  Resp 18  Ht '5\' 7"'  (1.702 m)  Wt 185 lb (83.915 kg)  BMI 28.97 kg/m2  SpO2 99% Physical Exam  Constitutional: He appears well-developed and well-nourished.  HENT:  Head: Normocephalic.  Eyes: Pupils are equal, round, and reactive to light.  Cardiovascular: Normal rate and regular rhythm.   Pulmonary/Chest: Effort normal.  Abdominal: Soft.  Musculoskeletal: He exhibits no edema.  Neurological: He is alert.   Not able to remember 3 items immediately. Not able to recognize his family members. Moves all extremities  equally.  Skin: Skin is warm.    ED Course  Procedures (including critical care time) Labs Review Labs Reviewed  DIFFERENTIAL - Abnormal; Notable for the following:    Neutrophils Relative % 79 (*)    All other components within normal limits  COMPREHENSIVE METABOLIC PANEL - Abnormal; Notable for the following:    Sodium 130 (*)    Glucose, Bld 663 (*)    Creatinine, Ser 1.65 (*)    GFR calc non Af Amer 39 (*)  GFR calc Af Amer 46 (*)    All other components within normal limits  URINALYSIS, ROUTINE W REFLEX MICROSCOPIC - Abnormal; Notable for the following:    APPearance CLOUDY (*)    Glucose, UA >1000 (*)    Hgb urine dipstick SMALL (*)    Protein, ur 30 (*)    Leukocytes, UA MODERATE (*)    All other components within normal limits  CBG MONITORING, ED - Abnormal; Notable for the following:    Glucose-Capillary 377 (*)    All other components within normal limits  I-STAT CHEM 8, ED - Abnormal; Notable for the following:    Sodium 134 (*)    BUN 27 (*)    Creatinine, Ser 1.40 (*)    Glucose, Bld 665 (*)    All other components within normal limits  PROTIME-INR  APTT  CBC  URINE MICROSCOPIC-ADD ON  Randolm Idol, ED    Imaging Review Dg Chest 2 View  04/08/2014   CLINICAL DATA:  Altered mental status. Stroke 3 weeks ago. Speech problems and difficulty walking since yesterday.  EXAM: CHEST  2 VIEW  COMPARISON:  03/10/2011  FINDINGS: Heart and mediastinal contours are within normal limits. No focal opacities or effusions. No acute bony abnormality.  IMPRESSION: No active cardiopulmonary disease.   Electronically Signed   By: Rolm Baptise [name]   On: 04/08/2014 22:29   Ct Head (brain) Wo Contrast  04/08/2014   CLINICAL DATA:  Speech and difficulty walking since yesterday. History of stroke 3 weeks ago.  EXAM: CT HEAD WITHOUT CONTRAST  TECHNIQUE: Contiguous axial images were obtained from the base of the skull through the vertex without intravenous contrast.   COMPARISON:  02/26/2014  FINDINGS: Punctate low-density areas noted throughout the left cerebral hemisphere, likely related to the previously seen left cerebral infarcts by previous MRI.  Old infarcts in the right cerebellum. No acute infarct. No hemorrhage or hydrocephalus. No mass lesion or midline shift.  No acute calvarial abnormality. Visualized paranasal sinuses and mastoids clear. Orbital soft tissues unremarkable.  IMPRESSION: Punctate low-density areas throughout the left cerebral hemisphere corresponding to the areas of infarct seen on previous/recent MRI. No acute infarction.   Electronically Signed   By: Rolm Baptise [name]   On: 04/08/2014 22:18     EKG Interpretation None      MDM   Final diagnoses:  Altered mental status  Hyperglycemia     patient with altered mental status with recent stroke. Found to be hyperglycemic without DKA. Head CT stable. Hyperglycemia could cause some numbness, as could a stroke.  Will admit to internal medicine.    Jasper Riling. Alvino Chapel, MD 04/09/14 [phone]

## 2014-04-09 NOTE — Progress Notes (Signed)
Subjective: Pt doing well; has ambulated in hallway; currently denies any acute c/o; well known to IR service from recent cerebral angio (02/2014) for left brain CVA ; admitted 12/27 with confusion, speech difficulty, ataxia and apparent progression of infarcts/cerebrovascular disease; request now received for follow up cerebral arteriogram.  Objective: Vital signs in last 24 hours: Temp:  [97.8 F (36.6 C)-98.6 F (37 C)] 98.6 F (37 C) (12/28 1254) Pulse Rate:  [65-115] 79 (12/28 1254) Resp:  [18-27] 20 (12/28 1254) BP: (118-177)/(54-86) 125/62 mmHg (12/28 1254) SpO2:  [95 %-100 %] 99 % (12/28 1254) Weight:  [180 lb 8 oz (81.874 kg)-185 lb (83.915 kg)] 180 lb 8 oz (81.874 kg) (12/28 0258)    Intake/Output from previous day:   Intake/Output this shift: Total I/O In: 240 [P.O.:240] Out: 300 [Urine:300]  Pt awake/alert; answers questions appropriately; chest- CTA bilat; heart- RRR; abd- soft,+BS,NT; ext- FROM, no sig edema; strength 4-5/5 bilat; sens fxn =/intact  Lab Results:   Recent Labs  04/08/14 2143 04/08/14 2151 04/09/14 1210  WBC 9.3  --  9.7  HGB 13.3 15.0 12.6*  HCT 40.7 44.0 36.8*  PLT 250  --  235   BMET  Recent Labs  04/08/14 2143 04/08/14 2151 04/09/14 1210  NA 130* 134* 140  K 3.9 4.0 3.8  CL 98 100 109  CO2 22  --  25  GLUCOSE 663* 665* 181*  BUN 21 27* 18  CREATININE 1.65* 1.40* 1.15  CALCIUM 9.0  --  9.1   PT/INR  Recent Labs  04/08/14 2143  LABPROT 14.0  INR 1.07   ABG No results for input(s): PHART, HCO3 in the last 72 hours.  Invalid input(s): PCO2, PO2  Studies/Results: Dg Chest 2 View  04/08/2014   CLINICAL DATA:  Altered mental status. Stroke 3 weeks ago. Speech problems and difficulty walking since yesterday.  EXAM: CHEST  2 VIEW  COMPARISON:  03/10/2011  FINDINGS: Heart and mediastinal contours are within normal limits. No focal opacities or effusions. No acute bony abnormality.  IMPRESSION: No active cardiopulmonary  disease.   Electronically Signed   By: Rolm Baptise [name]   On: 04/08/2014 22:29   Ct Head (brain) Wo Contrast  04/08/2014   CLINICAL DATA:  Speech and difficulty walking since yesterday. History of stroke 3 weeks ago.  EXAM: CT HEAD WITHOUT CONTRAST  TECHNIQUE: Contiguous axial images were obtained from the base of the skull through the vertex without intravenous contrast.  COMPARISON:  02/26/2014  FINDINGS: Punctate low-density areas noted throughout the left cerebral hemisphere, likely related to the previously seen left cerebral infarcts by previous MRI.  Old infarcts in the right cerebellum. No acute infarct. No hemorrhage or hydrocephalus. No mass lesion or midline shift.  No acute calvarial abnormality. Visualized paranasal sinuses and mastoids clear. Orbital soft tissues unremarkable.  IMPRESSION: Punctate low-density areas throughout the left cerebral hemisphere corresponding to the areas of infarct seen on previous/recent MRI. No acute infarction.   Electronically Signed   By: Rolm Baptise [name]   On: 04/08/2014 22:18   Mri Brain Without Contrast  04/09/2014   CLINICAL DATA:  Initial evaluation for 2-3 days of speech difficulty and gait instability. Recent stroke.  EXAM: MRI HEAD WITHOUT CONTRAST  MRA HEAD WITHOUT CONTRAST  TECHNIQUE: Multiplanar, multiecho pulse sequences of the brain and surrounding structures were obtained without intravenous contrast. Angiographic images of the head were obtained using MRA technique without contrast.  COMPARISON:  Prior CT from 04/08/2014 as well as previous  MRI from 02/26/2014  FINDINGS: MRI HEAD FINDINGS  Multiple acute nonhemorrhagic ischemic infarcts seen scattered throughout the left cerebral hemisphere, involving the left frontal and parietal lobes, as well as the left temporal lobe at the temporoparietal junction. And left temporal lobes. These involve both the cortical gray matter and deep white matter of the left centrum semi ovale. No associated  hemorrhage or significant mass effect. No intracranial hemorrhage.  Remote bilateral cerebellar infarcts again noted, right greater than left. Remote infarct within the right thalamus also again noted. Chronic small vessel ischemic changes again noted, stable.  No mass lesion or midline shift.  No hydrocephalus.  Craniocervical junction within normal limits. Degenerative changes noted within the upper cervical spine. Pituitary gland normal. No acute abnormality seen about the orbits.  Paranasal sinuses clear.  No mastoid effusion.  MRA HEAD FINDINGS  ANTERIOR CIRCULATION:  Again, the left internal carotid artery is occluded. There is some reconstitution at the left supra clinoid ICA, likely via collateral flow cross the circle of Willis. Flow within the left middle cerebral artery and distal MCA branches is markedly attenuated and diminutive as compared to the contralateral right. This finding is similar relative to prior study.  Visualized distal cervical segment of the internal carotid artery widely patent with antegrade flow. Petrous segment widely patent. There is short-segment moderate to severe stenosis of the cavernous segment of the right ICA, also stable. Supra clinoid right ICA widely patent.  Right A1 segment is widely patent. The left A1 segment is poorly opacified, and may be hypoplastic or containing high-grade stenosis. Anterior communicating artery grossly normal. The A2 segments are widely patent bilaterally.  POSTERIOR CIRCULATION:  The right vertebral artery is occluded. Right posterior inferior cerebral artery not well evaluated, likely occluded as well. Multi focal stenoses, 1 of which is high-grade present within the distal left vertebral artery. B basilar artery is moderately marrow proximally.  Left P1 and P2 segments are widely patent. Right P1 segment not well visualized. The distal right PCA is not visualized as well, and may be occluded.  No aneurysm or vascular malformation.  IMPRESSION:  MRI HEAD IMPRESSION:  1. Multi focal nonhemorrhagic ischemic infarcts involving the left frontal and parietal lobes as well as the posterior left temporal lobe at the temporoparietal junction. No significant mass effect. 2. Stable appearance of remote bilateral cerebellar infarcts and remote lacunar infarct within the right thalamus. 3. Atrophy with chronic microvascular ischemic disease.  MRA HEAD IMPRESSION:  1. Occluded left internal carotid artery. There is some distal reconstitution at the supra clinoid left ICA, however, this is likely inadequate as flow within the left middle cerebral artery and its branches is markedly attenuated. 2. Focal moderate to severe stenosis within the cavernous right ICA. 3. Occluded right vertebral artery. 4. Multi focal moderate to severe stenosis within the distal left vertebral artery with moderate narrowing of the proximal basilar artery. 5. Poor visualization of the right P1 segment, with nonvisualization of the distal right P2 segment, which may be occluded. 6. Overall, these findings are not significantly changed relative to prior MRA from 02/26/2014.   Electronically Signed   By: Jeannine Boga [name]   On: 04/09/2014 06:41   Mr Jodene Nam Head/brain Wo Cm  04/09/2014   CLINICAL DATA:  Initial evaluation for 2-3 days of speech difficulty and gait instability. Recent stroke.  EXAM: MRI HEAD WITHOUT CONTRAST  MRA HEAD WITHOUT CONTRAST  TECHNIQUE: Multiplanar, multiecho pulse sequences of the brain and surrounding structures were obtained without  intravenous contrast. Angiographic images of the head were obtained using MRA technique without contrast.  COMPARISON:  Prior CT from 04/08/2014 as well as previous MRI from 02/26/2014  FINDINGS: MRI HEAD FINDINGS  Multiple acute nonhemorrhagic ischemic infarcts seen scattered throughout the left cerebral hemisphere, involving the left frontal and parietal lobes, as well as the left temporal lobe at the temporoparietal junction. And  left temporal lobes. These involve both the cortical gray matter and deep white matter of the left centrum semi ovale. No associated hemorrhage or significant mass effect. No intracranial hemorrhage.  Remote bilateral cerebellar infarcts again noted, right greater than left. Remote infarct within the right thalamus also again noted. Chronic small vessel ischemic changes again noted, stable.  No mass lesion or midline shift.  No hydrocephalus.  Craniocervical junction within normal limits. Degenerative changes noted within the upper cervical spine. Pituitary gland normal. No acute abnormality seen about the orbits.  Paranasal sinuses clear.  No mastoid effusion.  MRA HEAD FINDINGS  ANTERIOR CIRCULATION:  Again, the left internal carotid artery is occluded. There is some reconstitution at the left supra clinoid ICA, likely via collateral flow cross the circle of Willis. Flow within the left middle cerebral artery and distal MCA branches is markedly attenuated and diminutive as compared to the contralateral right. This finding is similar relative to prior study.  Visualized distal cervical segment of the internal carotid artery widely patent with antegrade flow. Petrous segment widely patent. There is short-segment moderate to severe stenosis of the cavernous segment of the right ICA, also stable. Supra clinoid right ICA widely patent.  Right A1 segment is widely patent. The left A1 segment is poorly opacified, and may be hypoplastic or containing high-grade stenosis. Anterior communicating artery grossly normal. The A2 segments are widely patent bilaterally.  POSTERIOR CIRCULATION:  The right vertebral artery is occluded. Right posterior inferior cerebral artery not well evaluated, likely occluded as well. Multi focal stenoses, 1 of which is high-grade present within the distal left vertebral artery. B basilar artery is moderately marrow proximally.  Left P1 and P2 segments are widely patent. Right P1 segment not well  visualized. The distal right PCA is not visualized as well, and may be occluded.  No aneurysm or vascular malformation.  IMPRESSION: MRI HEAD IMPRESSION:  1. Multi focal nonhemorrhagic ischemic infarcts involving the left frontal and parietal lobes as well as the posterior left temporal lobe at the temporoparietal junction. No significant mass effect. 2. Stable appearance of remote bilateral cerebellar infarcts and remote lacunar infarct within the right thalamus. 3. Atrophy with chronic microvascular ischemic disease.  MRA HEAD IMPRESSION:  1. Occluded left internal carotid artery. There is some distal reconstitution at the supra clinoid left ICA, however, this is likely inadequate as flow within the left middle cerebral artery and its branches is markedly attenuated. 2. Focal moderate to severe stenosis within the cavernous right ICA. 3. Occluded right vertebral artery. 4. Multi focal moderate to severe stenosis within the distal left vertebral artery with moderate narrowing of the proximal basilar artery. 5. Poor visualization of the right P1 segment, with nonvisualization of the distal right P2 segment, which may be occluded. 6. Overall, these findings are not significantly changed relative to prior MRA from 02/26/2014.   Electronically Signed   By: Jeannine Boga [name]   On: 04/09/2014 06:41    Anti-infectives: Anti-infectives    None      Assessment/Plan: Pt with hx HTN, prior left brain CVA , hyperlipidemia, DM, known cerebrovascular  disease and recent cerebral arteriogram on 03/01/2014 revealing severe preocclusive stenosis of Lt ICA prox., 80 to 85% stenosis of RT ICA prox cav segment, 80% stenosis of dominant Lt VBJ just prox to basilar artery, occluded non dominant RT VBJ at C1; now admitted with recurrent speech difficulties, confusion, ataxia, progression of disease. Plan is for follow up cerebral arteriogram on 12/29. Details/risks of procedure d/w pt/wife with their understanding and  consent.   LOS: 1 day    ALLRED,D [facility] 04/09/2014

## 2014-04-09 NOTE — Progress Notes (Signed)
STROKE TEAM PROGRESS NOTE   HISTORY Peter Nicholson is an 73 y.o. male history of TIA, DM2, HTN, and hyperlipidemia and recent CVA (discharged 02/2014) with acute left MCA infarcts. Presents to the ED 04/08/2014 with complaints of confusion, difficulty speaking and ataxia since Saturday 04/07/2014 at 1200 (LKW).His family is at the bedside and report that he has had difficulty getting words out and slurring of his speech.He was found to have a CT scan of the Brain which was negatie for acute findings (imaging reviewed), and his labs reveal a glucose of 663. His family report that he takes his medications, but he does not check his glucose levels because they found his glucometer and it was collecting dust.  At prior admission in November he had a angiogram done showing "severe preocclusive stenosis of left ICA, 80-85% stenosis of right ICA, 80% stenosis of dominant left vert and occluded right vert. With this stroke he had moderate aphasia. They state current symptoms are similar to prior stroke in November.   Of note, the family feels his symptoms (especially speech) have gotten much worse within the past few minutes. This does appear to coincide with him receiving a dose of hydralazine for a SBP of 168/75.  Patient was not administered TPA secondary to delay in arrival. He was admitted for further evaluation and treatment.   SUBJECTIVE (INTERVAL HISTORY) His wife and family are at the bedside.  Overall he feels his condition is unchanged. Wife reports continued speech abnormalities. Reports he is take Rx as prescribed.    OBJECTIVE Temp:  [98.1 F (36.7 C)-98.5 F (36.9 C)] 98.5 F (36.9 C) (12/28 0700) Pulse Rate:  [65-115] 83 (12/28 0258) Cardiac Rhythm:  [-]  Resp:  [18-27] 20 (12/28 0700) BP: (118-177)/(54-86) 146/76 mmHg (12/28 0700) SpO2:  [95 %-100 %] 97 % (12/28 0700) Weight:  [81.874 kg (180 lb 8 oz)-83.915 kg (185 lb)] 81.874 kg (180 lb 8 oz) (12/28 0258)   Recent  Labs Lab 04/08/14 2345 04/09/14 0333 04/09/14 0722  GLUCAP 377* 270* 336*    Recent Labs Lab 04/08/14 2143 04/08/14 2151  NA 130* 134*  K 3.9 4.0  CL 98 100  CO2 22  --   GLUCOSE 663* 665*  BUN 21 27*  CREATININE 1.65* 1.40*  CALCIUM 9.0  --     Recent Labs Lab 04/08/14 2143  AST 23  ALT 19  ALKPHOS 95  BILITOT 0.4  PROT 6.7  ALBUMIN 3.6    Recent Labs Lab 04/08/14 2143 04/08/14 2151  WBC 9.3  --   NEUTROABS 7.4  --   HGB 13.3 15.0  HCT 40.7 44.0  MCV 95.8  --   PLT 250  --    No results for input(s): CKTOTAL, CKMB, CKMBINDEX, TROPONINI in the last 168 hours.  Recent Labs  04/08/14 2143  LABPROT 14.0  INR 1.07    Recent Labs  04/08/14 2234  COLORURINE YELLOW  LABSPEC 1.028  PHURINE 5.5  GLUCOSEU >1000*  HGBUR SMALL*  BILIRUBINUR NEGATIVE  KETONESUR NEGATIVE  PROTEINUR 30*  UROBILINOGEN 0.2  NITRITE NEGATIVE  LEUKOCYTESUR MODERATE*       Component Value Date/Time   CHOL 175 04/09/2014 0550   TRIG 105 04/09/2014 0550   HDL 41 04/09/2014 0550   CHOLHDL 4.3 04/09/2014 0550   VLDL 21 04/09/2014 0550   LDLCALC 113* 04/09/2014 0550   Lab Results  Component Value Date   HGBA1C 9.0* 02/26/2014      Component Value Date/Time  LABOPIA NONE DETECTED 04/09/2014 0730   COCAINSCRNUR NONE DETECTED 04/09/2014 0730   LABBENZ NONE DETECTED 04/09/2014 0730   AMPHETMU NONE DETECTED 04/09/2014 0730   THCU NONE DETECTED 04/09/2014 0730   LABBARB NONE DETECTED 04/09/2014 0730    No results for input(s): ETH in the last 168 hours.  I have personally reviewed the radiological images below and agree with the radiology interpretations.  Dg Chest 2 View   04/08/2014   IMPRESSION: No active cardiopulmonary disease.      Ct Head (brain) Wo Contrast  04/08/2014  IMPRESSION: Punctate low-density areas throughout the left cerebral hemisphere corresponding to the areas of infarct seen on previous/recent MRI. No acute infarction.      Mri and Mra  Head/brain Wo Cm  04/09/2014   IMPRESSION: MRI HEAD IMPRESSION:  1. Multi focal nonhemorrhagic ischemic infarcts involving the left frontal and parietal lobes as well as the posterior left temporal lobe at the temporoparietal junction. No significant mass effect. 2. Stable appearance of remote bilateral cerebellar infarcts and remote lacunar infarct within the right thalamus. 3. Atrophy with chronic microvascular ischemic disease.  MRA HEAD IMPRESSION:  1. Occluded left internal carotid artery. There is some distal reconstitution at the supraclinoid left ICA, however, this is likely inadequate as flow within the left middle cerebral artery and its branches is markedly attenuated. 2. Focal moderate to severe stenosis within the cavernous right ICA. 3. Occluded right vertebral artery. 4. Multi focal moderate to severe stenosis within the distal left vertebral artery with moderate narrowing of the proximal basilar artery. 5. Poor visualization of the right P1 segment, with nonvisualization of the distal right P2 segment, which may be occluded. 6. Overall, these findings are not significantly changed relative to prior MRA from 02/26/2014.      2D Echocardiogram 02/2014  Left ventricle: The cavity size was normal. There was mild focal basal hypertrophy of the septum. Systolic function was at the lower limits of normal. The estimated ejection fraction was in the range of 50% to 55%.  Carotid Doppler   02/2014 1-39% right internal carotid artery stenosis. The left internal carotid artery waveform is atypical, suggestive of a possible distal obstruction. Vertebral arteries are patent with antegrade flow.    PHYSICAL EXAM  Temp:  [97.8 F (36.6 C)-98.6 F (37 C)] 98.3 F (36.8 C) (12/28 1820) Pulse Rate:  [65-100] 96 (12/28 1820) Resp:  [18-27] 20 (12/28 1820) BP: (118-177)/(54-86) 128/69 mmHg (12/28 1820) SpO2:  [95 %-100 %] 100 % (12/28 1820) Weight:  [180 lb 8 oz (81.874 kg)] 180 lb 8 oz (81.874 kg)  (12/28 0258)  General - Well nourished, well developed, in no apparent distress.  Ophthalmologic - not cooperative on exam.  Cardiovascular - Regular rate and rhythm with no murmur.  Mental Status -  Awake alert and orientated to self and place but hard to tell time. Language was assessed and found to have expressive aphasia, difficulty with naming and repetition. Comprehension intact largely.   Cranial Nerves II - XII - II - Visual field intact OU. III, IV, VI - Extraocular movements intact. V - Facial sensation intact bilaterally. VII - Facial movement intact bilaterally. VIII - Hearing & vestibular intact bilaterally. X - Palate elevates symmetrically. XI - Chin turning & shoulder shrug intact bilaterally. XII - Tongue protrusion intact.  Motor Strength - The patient's strength was normal in all extremities but pronator drift was present on the right.  Bulk was normal and fasciculations were absent.   Motor  Tone - Muscle tone was assessed at the neck and appendages and was normal.  Reflexes - The patient's reflexes were 1+ in all extremities and he had no pathological reflexes.  Sensory - Light touch, temperature/pinprick were assessed and were normal.    Coordination - The patient had normal movements in the hands and feet with no ataxia or dysmetria.  Tremor was absent.  Gait and Station - not tested.   ASSESSMENT/PLAN [name] is a 73 y.o. male with history of TIA, DM2, HTN, and hyperlipidemia and recent CVA (discharged 02/2014) with acute left MCA infarcts. Presents with complaints of confusion, difficulty speaking and ataxia. He did not receive IV t-PA  due to delay in arrival.   Stroke:  Dominant multiple left frontal, parietal and temporal infarcts, in watershed distribution, felt to be ? thromboembolic secondary to stump emboli from L ICA or from hypoperfusion due to L ICA near occlusion. Pt had previous left ICA CEA but apparently re-stenosed.  Resultant   Expressive aphasia   MRI   Multi focal left frontal, left parietal and left temporal lobe infarcts. Old bilateral cerebellar infarcts and old right thalamus lacune.   MRA  Unchanged since Nov 2015. - Occluded left ICA -cavernous right ICA focal moderate to severe stenosis  -R VA occluded -distal left vertebral artery multi focal moderate to severe stenosis with moderate narrowing of proximal BA  Carotid Doppler  L ICA distal obstruction, right ICA 1-39%  2D Echo  No source of embolus   Angio Nov 2015 severe preocclusive L ICA, R ICA 80-85%, L VBJ 80%, occluded R VBJ at C1  Dr. Estanislado Pandy had planned OP angio for Jan 2016. Will ask for angio to be done IP this admission. Khian Remo discussed with Deveshwar. Will do tomorrow, continue IVF. If left extracranial ICA not fully occluded, will recommend to consider left carotid stent. Would not recommend intracranial stent at this point.   Lovenox 40 mg sq daily for VTE prophylaxis  Diet heart healthy/carb modified thin liquids. NPO time specified.  aspirin 81 mg orally every day and clopidogrel 75 mg orally every day prior to admission, placed on last admission x 3 months then one alone; now on aspirin 325 mg orally every day and clopidogrel 75 mg orally every day. Will decrease aspirin back to 81 mg daily due to risk of hemorrhage.  Patient counseled to be compliant with his antithrombotic medications  Ongoing aggressive stroke risk factor management  Therapy recommendations:  pending   Disposition:  Anticipate return home  Hypertension  Acute neuro worsening after receiving hydralazine in ED for SBP 168  Does not treat unless SBP > 180  Keep BP 130-160  IVF 100cc/hr  BP 118-177/73-76 past 24h (04/09/2014 @ 10:33 AM)  Avoid hypotension due to critical stenosis of left ICA   Hyperlipidemia  Home meds:  zocor 40,  resumed in hospital  LDL 113, goal < 70  Switch to high dose lipitor 80  Continue lipitor at  discharge  Diabetes  HgbA1c 9.3, goal < 7.0  Uncontrolled   On insulin and gliburide  SSI  DM education.  Manage pre primary team.  Other Stroke Risk Factors  Advanced age  ETOH use  Hx stroke/TIA  Nov 2015 L MCA watershed infarcts thromboembolic secondary to proximal LICA occlusion with resultantmoderate expressive aphasia  Nov 2012 L brain TIA off Plavix in prep for leg surgery  2002 VB TIA, angioplasty of lesion  07/1999 - BA occlusion, L ICA stenosis, asx,  resultant L CEA  Obstructive sleep apnea, doesn't wear CPAP   Other Active Problems  Pyuria, C&S pending  Mild memory problems - evaluated as an OP 06/2012   Other Pertinent History  Kidney cancer  Gout  IBS  Seizures , last seizure Nov 2012  GERD  Hospital day # [place] for Pager information 04/09/2014 10:36 AM   I, the attending vascular neurologist, have personally obtained a history, examined the patient, evaluated laboratory data, individually viewed imaging studies and agree with radiology interpretations. I obtained additional history from pt's wife at bedside due to pt's aphasia. I also discussed with Dr. Delphina Cahill regarding his left ICA stenosis and he agree to perform angio in am due to pt stroke with failure on medical treatment. Together with the NP/PA, we formulated the assessment and plan of care which reflects our mutual decision.  I have made any additions or clarifications directly to the above note and agree with the findings and plan as currently documented.   73 yo M with multiple stroke risk factors had left ICA CEA more than 10 years ago was admitted one month ago for left MCA/ACA, MCA/PCA watershed distribution strokes. Found to have left ICA high grade stenosis, discharged on dural antiplatelet. On this admission, pt again had acute stroke with left MCA/ACA, MCA/PCA watershed distribution strokes. He was on dural antiplatelet and seems  he failed the medical treatment. CTA this time no significant change from one month ago but had recurrent watershed stroke, will consider angio in am and to see if pt is candidate for carotid stent.   Rosalin Hawking, MD PhD Stroke Neurology 04/09/2014 10:30 PM    To contact Stroke Continuity provider, please refer to [email or web address] After hours, contact General Neurology

## 2014-04-09 NOTE — Consult Note (Signed)
Stroke Consult    Chief Complaint: confusion, trouble speaking and walking HPI: Peter Nicholson is an 73 y.o. male history of TIA, DM2, HTN, and hyperlipidemia and recent CVA (discharged 02/2014) with acute left MCA infarcts. Presents to the ED with complaints of confusion, difficulty speaking and ataxia since Saturday.His family is at the bedside and report that he has had difficulty getting words out and slurring of his speech.He was found to have a CT scan of the Brain which was negatie for acute findings (imaging reviewed), and his labs reveal a glucose of 663. His family report that he takes his medications, but he does not check his glucose levels because they found his glucometer and it was collecting dust.  At prior admission in November he had a angiogram done showing "severe preocclusive stenosis of left ICA, 80-85% stenosis of right ICA, 80% stenosis of dominant left vert and occluded right vert. With this stroke he had moderate aphasia. They state current symptoms are similar to prior stroke in November.   Of note, the family feels his symptoms (especially speech) have gotten much worse within the past few minutes. This does appear to coincide with him receiving a dose of hydralazine for a SBP of 168/75.  Date last known well: 04/07/2014 Time last known well: 1200 tPA Given: no, outside therapeutic window  Past Medical History  Diagnosis Date  . Obstructive sleep apnea (adult) (pediatric)     doesnt wear CPAP  . HTN (hypertension)   . Cerebrovascular disease, unspecified   . Unspecified venous (peripheral) insufficiency   . Pure hypercholesterolemia   . Type II or unspecified type diabetes mellitus without mention of complication, not stated as uncontrolled   . Overweight(278.02)   . Diaphragmatic hernia without mention of obstruction or gangrene   . Esophageal reflux   . Diverticulosis of colon (without mention of hemorrhage)   . Other specified congenital anomaly of  kidney   . Kidney carcinoma   . Calculus of kidney   . Gout, unspecified   . Lumbago   . Anxiety state, unspecified   . History of gallstones   . IBS (irritable bowel syndrome)   . History of seizures   . Bowel obstruction   . Osteoarthrosis, unspecified whether generalized or localized, unspecified site     tendonitis  . Blood transfusion without reported diagnosis   . Stroke     2001  . Seizures     last seizure November 2012    Past Surgical History  Procedure Laterality Date  . Incisional hernia repair  1/96    Dr.Leone  . Nasal sinus surgery  12/96    Dr.Redman  . Cholecystectomy  9/04    Dr. Rebekah Chesterfield  . 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 Carotid endarterectomy  09/2000    Dr.Lawson  . Veterbral art angioplasty      x2  . Small intestine surgery    . External fixation leg  03/10/2011    Procedure: EXTERNAL FIXATION LEG;  Surgeon: Rozanna Box;  Location: [facility];  Service: Orthopedics;  Laterality: Right;  . Orif tibia plateau  03/24/2011    Procedure: OPEN REDUCTION INTERNAL FIXATION (ORIF) TIBIAL PLATEAU;  Surgeon: Rozanna Box;  Location: [facility];  Service: Orthopedics;  Laterality: Right;    Family History  Problem Relation Age of Onset  . Heart attack Father   . Dementia Mother   . Ovarian cancer Paternal Grandmother   .  Breast cancer Sister   . Colon cancer Neg Hx   . Esophageal cancer Neg Hx   . Rectal cancer Neg Hx   . Stomach cancer Neg Hx    Social History:  reports that he has never smoked. He has never used smokeless tobacco. He reports that he drinks alcohol. He reports that he does not use illicit drugs.  Allergies:  Allergies  Allergen Reactions  . Dilaudid [Hydromorphone Hcl] Other (See Comments)    hallucinations  . Fentanyl Other (See Comments)    hallucinations    Medications Prior to Admission  Medication Sig Dispense Refill  . allopurinol (ZYLOPRIM) 300 MG tablet TAKE 1 TABLET (300 MG TOTAL) BY  MOUTH DAILY. 90 tablet 1  . ALPRAZolam (XANAX) 0.5 MG tablet TAKE ONE-HALF TO ONE TABLET BY MOUTH THREE TIMES DAILY AS NEEDED FOR NERVES 90 tablet 5  . aspirin EC 81 MG EC tablet Take 1 tablet (81 mg total) by mouth daily. 30 tablet 0  . Blood Glucose Monitoring Suppl (ONE TOUCH ULTRA SYSTEM KIT) W/DEVICE KIT Test blood sugar up to 3 times daily 1 each 0  . clopidogrel (PLAVIX) 75 MG tablet Take 1 tablet (75 mg total) by mouth daily. 90 tablet 0  . glucose blood test strip Test blood sugar up to 3 times daily 100 each 6  . glyBURIDE (DIABETA) 5 MG tablet Take 5 mg by mouth daily with breakfast.     . Insulin Glargine (LANTUS SOLOSTAR) 100 UNIT/ML Solostar Pen Inject 10 Units into the skin at bedtime. 3 mL 5  . Insulin Pen Needle 31G X 8 MM MISC Use with Lantus Solostar Pen once daily as directed 100 each 5  . lisinopril (PRINIVIL,ZESTRIL) 5 MG tablet Take 1 tablet (5 mg total) by mouth daily. 30 tablet 0  . metFORMIN (GLUCOPHAGE) 500 MG tablet Take 2 tablets by mouth two times daily 360 tablet 3  . nortriptyline (PAMELOR) 25 MG capsule Take 25 mg by mouth at bedtime.    . pantoprazole (PROTONIX) 40 MG tablet TAKE 1 TABLET (40 MG TOTAL) BY MOUTH DAILY AT 12 NOON. 90 tablet 3  . saxagliptin HCl (ONGLYZA) 5 MG TABS tablet Take 1 tablet (5 mg total) by mouth daily. 90 tablet 3  . sertraline (ZOLOFT) 50 MG tablet Take 1 tablet (50 mg total) by mouth at bedtime. 90 tablet 2  . simvastatin (ZOCOR) 40 MG tablet TAKE ONE TABLET BY MOUTH AT BEDTIME 90 tablet 3  . traMADol (ULTRAM) 50 MG tablet Take 50 mg by mouth every 6 (six) hours as needed for pain.       ROS: Out of a complete 14 system review, the patient complains of only the following symptoms, and all other reviewed systems are negative.    Physical Examination: Filed Vitals:   04/09/14 0258  BP: 168/75  Pulse: 83  Temp: 98.4 F (36.9 C)  Resp: 22   Physical Exam  Constitutional: He appears well-developed and well-nourished.  Psych:  Affect appropriate to situation Eyes: No scleral injection HENT: No OP obstrucion Head: Normocephalic.  Cardiovascular: Normal rate and regular rhythm.  Respiratory: Effort normal and breath sounds normal.  GI: Soft. Bowel sounds are normal. No distension. There is no tenderness.  Skin: WDI  Neurologic Examination: Mental Status: Alert, oriented to name. Intermittently follows simple commands. Moderate expressive > receptive aphasia. Unable to repeat. Mild dysarthria.  Cranial Nerves: II: visual fields grossly normal, pupils equal, round, reactive to light  III,IV, VI: ptosis not present,  extra-ocular motions intact bilaterally V,VII: right facial weakness. facial light touch sensation normal bilaterally VIII: hearing normal bilaterally IX,X: gag reflex present XI: trapezius strength/neck flexion strength normal bilaterally XII: tongue strength normal  Motor: Limited due to not following commands. Grossly appears to have full strength on the left side. Mild proximal and distal RUE weakness. Able to lift RLE off bed and against light resistance. Tone and bulk:normal tone throughout; no atrophy noted Sensory: withdrawals to noxious stimuli in all 4 extremities Deep Tendon Reflexes: 2+ and symmetric throughout Plantars: Right: downgoing   Left: downgoing Cerebellar: Unable to test due to not following commands Gait: normal gait and station  Laboratory Studies:   Basic Metabolic Panel:  Recent Labs Lab 04/08/14 2143 04/08/14 2151  NA 130* 134*  K 3.9 4.0  CL 98 100  CO2 22  --   GLUCOSE 663* 665*  BUN 21 27*  CREATININE 1.65* 1.40*  CALCIUM 9.0  --     Liver Function Tests:  Recent Labs Lab 04/08/14 2143  AST 23  ALT 19  ALKPHOS 95  BILITOT 0.4  PROT 6.7  ALBUMIN 3.6   No results for input(s): LIPASE, AMYLASE in the last 168 hours. No results for input(s): AMMONIA in the last 168 hours.  CBC:  Recent Labs Lab 04/08/14 2143 04/08/14 2151  WBC 9.3   --   NEUTROABS 7.4  --   HGB 13.3 15.0  HCT 40.7 44.0  MCV 95.8  --   PLT 250  --     Cardiac Enzymes: No results for input(s): CKTOTAL, CKMB, CKMBINDEX, TROPONINI in the last 168 hours.  BNP: Invalid input(s): POCBNP  CBG:  Recent Labs Lab 04/08/14 2345 04/09/14 0333  GLUCAP 377* 270*    Microbiology: Results for orders placed or performed in visit on 01/17/13  Urine culture     Status: None   Collection Time: 01/17/13  3:50 PM  Result Value Ref Range Status   Culture ENTEROCOCCUS SPECIES  Final   Colony Count 50,000 COLONIES/ML  Final   Organism ID, Bacteria ENTEROCOCCUS SPECIES  Final      Susceptibility   Enterococcus species -  (no method available)    AMPICILLIN <=2 Sensitive     LEVOFLOXACIN 1 Sensitive     NITROFURANTOIN <=16 Sensitive     VANCOMYCIN 2 Sensitive     TETRACYCLINE >=16 Resistant     Coagulation Studies:  Recent Labs  04/08/14 2143  LABPROT 14.0  INR 1.07    Urinalysis:  Recent Labs Lab 04/08/14 2234  COLORURINE YELLOW  LABSPEC 1.028  PHURINE 5.5  GLUCOSEU >1000*  HGBUR SMALL*  BILIRUBINUR NEGATIVE  KETONESUR NEGATIVE  PROTEINUR 30*  UROBILINOGEN 0.2  NITRITE NEGATIVE  LEUKOCYTESUR MODERATE*    Lipid Panel:     Component Value Date/Time   CHOL 124 02/27/2014 0613   TRIG 118 02/27/2014 0613   HDL 40 02/27/2014 0613   CHOLHDL 3.1 02/27/2014 0613   VLDL 24 02/27/2014 0613   LDLCALC 60 02/27/2014 0613    HgbA1C:  Lab Results  Component Value Date   HGBA1C 9.0* 02/26/2014    Urine Drug Screen:     Component Value Date/Time   LABOPIA NONE DETECTED 02/26/2014 0703   COCAINSCRNUR NONE DETECTED 02/26/2014 0703   LABBENZ POSITIVE* 02/26/2014 0703   AMPHETMU NONE DETECTED 02/26/2014 0703   THCU NONE DETECTED 02/26/2014 0703   LABBARB NONE DETECTED 02/26/2014 0703    Alcohol Level: No results for input(s): ETH in the last 168  hours.  Other results:  Imaging: Dg Chest 2 View  04/08/2014   CLINICAL DATA:   Altered mental status. Stroke 3 weeks ago. Speech problems and difficulty walking since yesterday.  EXAM: CHEST  2 VIEW  COMPARISON:  03/10/2011  FINDINGS: Heart and mediastinal contours are within normal limits. No focal opacities or effusions. No acute bony abnormality.  IMPRESSION: No active cardiopulmonary disease.   Electronically Signed   By: Rolm Baptise [name]   On: 04/08/2014 22:29   Ct Head (brain) Wo Contrast  04/08/2014   CLINICAL DATA:  Speech and difficulty walking since yesterday. History of stroke 3 weeks ago.  EXAM: CT HEAD WITHOUT CONTRAST  TECHNIQUE: Contiguous axial images were obtained from the base of the skull through the vertex without intravenous contrast.  COMPARISON:  02/26/2014  FINDINGS: Punctate low-density areas noted throughout the left cerebral hemisphere, likely related to the previously seen left cerebral infarcts by previous MRI.  Old infarcts in the right cerebellum. No acute infarct. No hemorrhage or hydrocephalus. No mass lesion or midline shift.  No acute calvarial abnormality. Visualized paranasal sinuses and mastoids clear. Orbital soft tissues unremarkable.  IMPRESSION: Punctate low-density areas throughout the left cerebral hemisphere corresponding to the areas of infarct seen on previous/recent MRI. No acute infarction.   Electronically Signed   By: Rolm Baptise [name]   On: 04/08/2014 22:18    Assessment: 73 y.o. male with history of HTN, DM, HLD, recent CVA with multivessel stenosis presenting with 2-3 days of speech difficulty and gait instability. Found to have blood sugar of 663 in ED. Concern is for a new ischemic event vs recrudescence of prior stroke symptoms likely related to markedly elevated hyperglycemia.   With known multivessel stenosis would not be overly aggressive with blood pressure control at this time pending MRI/A results. Would not treat SBP unless >175. Of note, he appears to have an acute worsening after getting hydralazine for a SBP of 168.     Stroke Risk Factors -CVA, DM, HTN, carotid and vertebral stenosis Plan: 1. HgbA1c, fasting lipid panel 2. MRI, MRA  of the brain without contrast 3. PT consult, OT consult, Speech consult 4. Echocardiogram 5. Carotid dopplers 6. Prophylactic therapy-continue ASA and Plavix 7. Risk factor modification.  8. Telemetry monitoring 9. Frequent neuro checks 10. NPO until RN stroke swallow screen 11. Hyperglycemia treatment per primary team     [name] Like, DO [facility] [phone]  If 7pm- 7am, please page neurology on call as listed in [place]. 04/09/2014, 3:59 AM

## 2014-04-09 NOTE — Progress Notes (Signed)
UA sent, EKG in chart completed by Roselyn Reef, RN.    Ave Filter, RN

## 2014-04-09 NOTE — Progress Notes (Signed)
Occupational Therapy Evaluation Patient Details Name: Peter Nicholson MRN: [id number] DOB: 06/04/1940 Today's Date: 04/09/2014    History of Present Illness  73 yo male with hx of htn, dm2, CVA c/o dysarthria beginning about 9:30pm.  Pt states that he had difficulty expressing himself.  Pt brought to ED and CT scan was negative for any acute process.  MRI: Multi focal nonhemorrhagic ischemic infarcts involving the left  Pt was recently admitted in 11/15 with left watershed infarcts of the MCA due to left ICA occlusion, as well as h/o right CVA in 2001.   Clinical Impression   PTA, pt apparently lived at home with his wife. Unsure of PLOF - no family present during eval. Pt currently at S level with ADL and functional mobility for ADL. Pt with apparent expressive aphasia. Feel pt will be appropriate to D/C home with 24/7 S. OT signing off.     Follow Up Recommendations  No OT follow up;Supervision/Assistance - 24 hour    Equipment Recommendations  None recommended by OT    Recommendations for Other Services       Precautions / Restrictions Precautions Precautions: Fall Restrictions Weight Bearing Restrictions: No      Mobility Bed Mobility Overal bed mobility: Modified Independent Bed Mobility: Supine to Sit;Sit to Supine     Supine to sit: Modified independent (Device/Increase time) Sit to supine: Modified independent (Device/Increase time)      Transfers Overall transfer level: Modified independent Equipment used: None Transfers: Sit to/from Stand Sit to Stand: Modified independent (Device/Increase time)         General transfer comment: no LOB or unsteadiness noted    Balance Overall balance assessment: Needs assistance Sitting-balance support: No upper extremity supported Sitting balance-Leahy Scale: Good     Standing balance support: No upper extremity supported Standing balance-Leahy Scale: Fair                              ADL                                          General ADL Comments: Overall S wtih ADL. Most likely baseline  ?perseveration during tasks, but functional     Vision Eye Alignment: Within Functional Limits Alignment/Gaze Preference: Within Defined Limits Ocular Range of Motion: Within Functional Limits Tracking/Visual Pursuits: Able to track stimulus in all quads without difficulty Saccades: Within functional limits Convergence: Within functional limits     Additional Comments: does not appear to have field deficits   Perception Perception Comments: no deificts noted during functional task   Praxis Praxis Praxis tested?: Deficits Deficits: Perseveration    Pertinent Vitals/Pain Pain Assessment: No/denies pain     Hand Dominance Right   Extremity/Trunk Assessment Upper Extremity Assessment Upper Extremity Assessment: Overall WFL for tasks assessed   Lower Extremity Assessment Lower Extremity Assessment: Overall WFL for tasks assessed   Cervical / Trunk Assessment Cervical / Trunk Assessment: Normal   Communication Communication Communication: Expressive difficulties;Receptive difficulties   Cognition Arousal/Alertness: Awake/alert Behavior During Therapy: WFL for tasks assessed/performed Overall Cognitive Status: History of cognitive impairments - at baseline                    General Comments       Exercises       Shoulder Instructions  Home Living Family/patient expects to be discharged to:: Private residence Living Arrangements: Spouse/significant other Available Help at Discharge: Family Type of Home: House Home Access: [place]: One level     Bathroom Shower/Tub: Teacher, early years/pre: Handicapped height Bathroom Accessibility: Yes How Accessible: Accessible via walker Home Equipment: Hand held shower head;Wheelchair - Biomedical scientist Comments: home info from previous  admission, family has difficulty answering open ended questions so did not revisit this info      Prior Functioning/Environment Level of Independence: Needs assistance  Gait / Transfers Assistance Needed: states he uses a RW ADL's / Homemaking Assistance Needed: seems that wife takes care of home, unsure of pt's needs with ADL's Communication / Swallowing Assistance Needed: expressive difficulties Comments: family not present to verify PLOF    OT Diagnosis: Cognitive deficits   OT Problem List: Decreased activity tolerance;Impaired balance (sitting and/or standing);Decreased coordination;Decreased safety awareness;Decreased knowledge of use of DME or AE   OT Treatment/Interventions:      OT Goals(Current goals can be found in the care plan section) Acute Rehab OT Goals Patient Stated Goal: none stated OT Goal Formulation: All assessment and education complete, DC therapy  OT Frequency:     Barriers to D/C:            Co-evaluation              End of Session Equipment Utilized During Treatment: Gait belt Nurse Communication: Mobility status  Activity Tolerance: Patient tolerated treatment well Patient left: in bed;with call bell/phone within reach;with bed alarm set   Time: 9024-0973 OT Time Calculation (min): 17 min Charges:  OT General Charges $OT Visit: 1 Procedure OT Evaluation $Initial OT Evaluation Tier I: 1 Procedure OT Treatments $Self Care/Home Management : 8-22 mins G-Codes:    Tanaia Hawkey,HILLARY 2014-04-11, 4:56 PM   [facility], OTR/L  [phone] Apr 11, 2014

## 2014-04-09 NOTE — Evaluation (Signed)
Physical Therapy Evaluation Patient Details Name: Peter Nicholson MRN: [id number] DOB: 07/10/1940 Today's Date: 04/09/2014   History of Present Illness   73 yo male with hx of htn, dm2, CVA c/o dysarthria beginning about 9:30pm.  Pt states that he had difficulty expressing himself.  Pt brought to ED and CT scan was negative for any acute process.  MRI: Multi focal nonhemorrhagic ischemic infarcts involving the left  Pt was recently admitted in 11/15 with left watershed infarcts of the MCA due to left ICA occlusion, as well as h/o right CVA in 2001.  Clinical Impression  Pt admitted with above diagnosis. Pt currently with functional limitations due to the deficits listed below (see PT Problem List). Pt ambulated 33' with min-guard A, presents at or near baseline with mobility, family not present during session to verify though. Would like to see pt again after procedure but do not expect he will need PT after d/c.  Pt will benefit from skilled PT to increase their independence and safety with mobility to allow discharge to the venue listed below.       Follow Up Recommendations No PT follow up    Equipment Recommendations  None recommended by PT    Recommendations for Other Services       Precautions / Restrictions Precautions Precautions: Fall Restrictions Weight Bearing Restrictions: No      Mobility  Bed Mobility Overal bed mobility: Modified Independent Bed Mobility: Supine to Sit;Sit to Supine     Supine to sit: Modified independent (Device/Increase time) Sit to supine: Modified independent (Device/Increase time)      Transfers Overall transfer level: Modified independent Equipment used: None Transfers: Sit to/from Stand Sit to Stand: Modified independent (Device/Increase time)         General transfer comment: no LOB or unsteadiness noted  Ambulation/Gait Ambulation/Gait assistance: Min guard Ambulation Distance (Feet): 450 Feet Assistive device: None Gait  Pattern/deviations: Step-through pattern;Decreased stride length Gait velocity: decreased and pt reports being comfortable at this slower pace Gait velocity interpretation: Below normal speed for age/gender General Gait Details: drifts with head turns, no overt LOB  Stairs            Wheelchair Mobility    Modified Rankin (Stroke Patients Only) Modified Rankin (Stroke Patients Only) Pre-Morbid Rankin Score: Slight disability Modified Rankin: Slight disability     Balance Overall balance assessment: Needs assistance Sitting-balance support: No upper extremity supported Sitting balance-Leahy Scale: Good     Standing balance support: No upper extremity supported Standing balance-Leahy Scale: Fair                               Pertinent Vitals/Pain Pain Assessment: No/denies pain  HR 108 bpm with ambulation, O2 sats 100% on RA    Home Living Family/patient expects to be discharged to:: Private residence Living Arrangements: Spouse/significant other Available Help at Discharge: Family Type of Home: House Home Access: Ramped entrance     Home Layout: One level Home Equipment: Hand held shower head;Wheelchair - Sport and exercise psychologist Comments: home info from previous admission, family has difficulty answering open ended questions so did not revisit this info    Prior Function Level of Independence: Needs assistance   Gait / Transfers Assistance Needed: pt reports that he walks with a cane or a RW but not sure which and notes from last visit stated no AD so unclear of AD needs  ADL's / Homemaking Assistance Needed: seems  that wife takes care of home, unsure of pt's needs with ADL's  Comments: family not present to verify PLOF     Hand Dominance   Dominant Hand: Right    Extremity/Trunk Assessment   Upper Extremity Assessment: Defer to OT evaluation;Overall WFL for tasks assessed           Lower Extremity Assessment: Overall WFL for  tasks assessed      Cervical / Trunk Assessment: Normal  Communication   Communication: Expressive difficulties;Receptive difficulties  Cognition Arousal/Alertness: Awake/alert Behavior During Therapy: WFL for tasks assessed/performed;Impulsive Overall Cognitive Status: History of cognitive impairments - at baseline       Memory: Decreased short-term memory              General Comments General comments (skin integrity, edema, etc.): pt follows verbal commands consistently    Exercises        Assessment/Plan    PT Assessment Patient needs continued PT services  PT Diagnosis Abnormality of gait   PT Problem List Decreased balance;Decreased cognition;Decreased safety awareness  PT Treatment Interventions DME instruction;Gait training;Stair training;Functional mobility training;Therapeutic activities;Therapeutic exercise;Balance training;Neuromuscular re-education;Cognitive remediation;Patient/family education   PT Goals (Current goals can be found in the Care Plan section) Acute Rehab PT Goals Patient Stated Goal: unable to state due to decr communication PT Goal Formulation: With patient Time For Goal Achievement: 04/24/15 Potential to Achieve Goals: Good    Frequency Min 3X/week   Barriers to discharge        Co-evaluation               End of Session Equipment Utilized During Treatment: Gait belt Activity Tolerance: Patient tolerated treatment well Patient left: in bed;with bed alarm set;with call bell/phone within reach Nurse Communication: Mobility status         Time: 1552-0802 PT Time Calculation (min) (ACUTE ONLY): 16 min   Charges:   PT Evaluation $Initial PT Evaluation Tier I: 1 Procedure PT Treatments $Gait Training: 8-22 mins   PT G Codes:       Leighton Roach, PT  Acute Rehab Services  [place], [place] 04/09/2014, 2:03 PM

## 2014-04-10 ENCOUNTER — Inpatient Hospital Stay (HOSPITAL_COMMUNITY): Payer: Medicare Other

## 2014-04-10 LAB — CBC
HCT: 35.5 % — ABNORMAL LOW (ref 39.0–52.0)
Hemoglobin: 11.6 g/dL — ABNORMAL LOW (ref 13.0–17.0)
MCH: 30.9 pg (ref 26.0–34.0)
MCHC: 32.7 g/dL (ref 30.0–36.0)
MCV: 94.7 fL (ref 78.0–100.0)
PLATELETS: 205 10*3/uL (ref 150–400)
RBC: 3.75 MIL/uL — ABNORMAL LOW (ref 4.22–5.81)
RDW: 12.2 % (ref 11.5–15.5)
WBC: 6.7 10*3/uL (ref 4.0–10.5)

## 2014-04-10 LAB — GLUCOSE, CAPILLARY
GLUCOSE-CAPILLARY: 177 mg/dL — AB (ref 70–99)
GLUCOSE-CAPILLARY: 187 mg/dL — AB (ref 70–99)
Glucose-Capillary: 170 mg/dL — ABNORMAL HIGH (ref 70–99)
Glucose-Capillary: 208 mg/dL — ABNORMAL HIGH (ref 70–99)

## 2014-04-10 LAB — BASIC METABOLIC PANEL
Anion gap: 4 — ABNORMAL LOW (ref 5–15)
BUN: 16 mg/dL (ref 6–23)
CO2: 24 mmol/L (ref 19–32)
CREATININE: 1.16 mg/dL (ref 0.50–1.35)
Calcium: 8.8 mg/dL (ref 8.4–10.5)
Chloride: 113 mEq/L — ABNORMAL HIGH (ref 96–112)
GFR calc Af Amer: 70 mL/min — ABNORMAL LOW (ref >90)
GFR, EST NON AFRICAN AMERICAN: 60 mL/min — AB (ref >90)
Glucose, Bld: 196 mg/dL — ABNORMAL HIGH (ref 70–99)
Potassium: 4.3 mmol/L (ref 3.5–5.1)
Sodium: 141 mmol/L (ref 135–145)

## 2014-04-10 MED ORDER — MIDAZOLAM HCL 2 MG/2ML IJ SOLN
INTRAMUSCULAR | Status: AC
Start: 2014-04-10 — End: 2014-04-10
  Filled 2014-04-10: qty 4

## 2014-04-10 MED ORDER — MIDAZOLAM HCL 2 MG/2ML IJ SOLN
INTRAMUSCULAR | Status: AC | PRN
Start: 2014-04-10 — End: 2014-04-10
  Administered 2014-04-10: 1 mg via INTRAVENOUS

## 2014-04-10 MED ORDER — HYDRALAZINE HCL 20 MG/ML IJ SOLN
INTRAMUSCULAR | Status: AC
  Filled 2014-04-10: qty 1

## 2014-04-10 MED ORDER — LIDOCAINE HCL 1 % IJ SOLN
INTRAMUSCULAR | Status: AC
Start: 2014-04-10 — End: 2014-04-10
  Filled 2014-04-10: qty 20

## 2014-04-10 MED ORDER — HYDRALAZINE HCL 20 MG/ML IJ SOLN
INTRAMUSCULAR | Status: AC | PRN
  Administered 2014-04-10: 5 mg via INTRAVENOUS

## 2014-04-10 MED ORDER — IOHEXOL 300 MG/ML  SOLN
150.0000 mL | Freq: Once | INTRAMUSCULAR | Status: AC | PRN
  Administered 2014-04-10: 70 mL via INTRA_ARTERIAL

## 2014-04-10 MED ORDER — HEPARIN SODIUM (PORCINE) 1000 UNIT/ML IJ SOLN
INTRAMUSCULAR | Status: AC
  Filled 2014-04-10: qty 1

## 2014-04-10 MED ORDER — SODIUM CHLORIDE 0.9 % IV SOLN: INTRAVENOUS | Status: AC

## 2014-04-10 NOTE — Progress Notes (Addendum)
Patient returned from IR, He appears to be resting quietly, able to open eyes spontaneously with voice and stimulation. Post IR orders reviewed with patient/family. RN reviewed again that patient needs to be bedrest x 3 hrs and keep right leg straight per Louie Casa, IR RN. Patient/family verbalized understanding. Per daughter, Dr. Estanislado Pandy and family have an understanding that "surgery" post this procedure possible in place but unsure. MD paged at [phone] to confirm so diet can be advance. Awaiting for call back. Family at bedside aware. Will continue to monitor.  Dr. Estanislado Pandy called back and confirmed. Diet advanced. Family notified.    Makinna Andy, RN.

## 2014-04-10 NOTE — Procedures (Signed)
S/P bilateral common carotid arteriograms,and Lt vert artery angiogram. Rt CFa approach. Findings. 1..Approx 70 % stenosis of prox cavernous RT ICA. 2.Approx 60 % stenosis of dominant LT VA at origin. 3..Interval progression of stenosis of LT ICA distal to origin of ophthalmic artery with severe near complete occlusion of prox cavernous Lt ICA

## 2014-04-10 NOTE — Sedation Documentation (Signed)
Patient is resting comfortably. 

## 2014-04-10 NOTE — Progress Notes (Signed)
Physical Therapy Treatment Patient Details Name: Peter Nicholson MRN: [id number] DOB: Aug 06, 1940 Today's Date: 04/10/2014    History of Present Illness  74 yo male with hx of htn, dm2, CVA c/o dysarthria beginning about 9:30pm.  Pt states that he had difficulty expressing himself.  Pt brought to ED and CT scan was negative for any acute process.  MRI: Multi focal nonhemorrhagic ischemic infarcts involving the left  Pt was recently admitted in 11/15 with left watershed infarcts of the MCA due to left ICA occlusion, as well as h/o right CVA in 2001.    PT Comments    Patient appears to be close to baseline. Continues with expressive difficulties. Session limited by patient going down for angiogram. Was able to walk unit with supervision and no LOB. Patient has ramped entrance into house and will have assistance from his wife. Patient safe to D/C from a mobility standpoint based on progression towards goals set on PT eval.     Follow Up Recommendations  No PT follow up     Equipment Recommendations  None recommended by PT    Recommendations for Other Services       Precautions / Restrictions Precautions Precautions: Fall    Mobility  Bed Mobility Overal bed mobility: Modified Independent                Transfers Overall transfer level: Modified independent                  Ambulation/Gait Ambulation/Gait assistance: Supervision Ambulation Distance (Feet): 600 Feet Assistive device: None   Gait velocity: decreased and pt reports being comfortable at this slower pace   General Gait Details: Drifts to R with head turn to R. two instances where patient bumped into objust on R but able to self correct with no LOB   Stairs         General stair comments: Patient has ramped entrance into house  Wheelchair Mobility    Modified Rankin (Stroke Patients Only) Modified Rankin (Stroke Patients Only) Pre-Morbid Rankin Score: Slight disability Modified Rankin:  Moderately severe disability     Balance                                    Cognition Arousal/Alertness: Awake/alert Behavior During Therapy: WFL for tasks assessed/performed Overall Cognitive Status: History of cognitive impairments - at baseline                      Exercises      General Comments        Pertinent Vitals/Pain Pain Assessment: No/denies pain    Home Living                      Prior Function            PT Goals (current goals can now be found in the care plan section) Progress towards PT goals: Progressing toward goals    Frequency  Min 3X/week    PT Plan Current plan remains appropriate    Co-evaluation             End of Session Equipment Utilized During Treatment: Gait belt Activity Tolerance: Patient tolerated treatment well Patient left: in bed;with bed alarm set;with call bell/phone within reach     Time: 0805-0824 PT Time Calculation (min) (ACUTE ONLY): 19 min  Charges:  $Gait Training: 8-22 mins  G Codes:      Jacqualyn Posey 04/10/2014, 8:51 AM 04/10/2014 Jacqualyn Posey PTA [phone] pager [phone] office

## 2014-04-10 NOTE — Sedation Documentation (Signed)
5 frech sheath removed and 76fr exoseal deployed to RFA. No bleeding or hematoma noted.

## 2014-04-10 NOTE — Progress Notes (Signed)
Patient left unit for IR. NO consent to be sign at this time. Family at bedside. TELE removed and notified.   Ave Filter, RN

## 2014-04-10 NOTE — Progress Notes (Signed)
[facility] PROGRESS NOTE  Peter Nicholson NLZ:[id number] DOB: Oct 08, 1940 DOA: 04/08/2014 PCP: Noralee Space, MD  Assessment/Plan: 1-Multiple acute nonhemorrhagic infarct left cerebral hemisphere:  Had Carotid US and 2D ECHO on 02/26/2014. S/P  Angiography 12-29 LDL 113, on statins.   Encephalopathy: in the event of acute stroke.   Diabetes; Continue with lantus and SSI.   Acute on chronic renal diseases; stage 2  Continue with IV fluids.  Renal function stable.   Hypertension:  PRN hydralazine for SBP more than 180.   Hyperlipidemia;  On statin.  LDL 113.   Pyuria; follow urine culture growing enterococcus. Follow sensitivity  S/P nephrectomy:   Code Status: Full Code.  Family Communication: care discussed with patient Disposition Plan: remain inpatient   Consultants:  Neurology  Procedures: Had Carotid US and 2D ECHO on 02/26/2014  Antibiotics:  none  HPI/Subjective: jsut came from angiography. Following command. Alert.    Objective: Filed Vitals:   04/10/14 1446  BP: 137/66  Pulse: 93  Temp: 97.5 F (36.4 C)  Resp: 18    Intake/Output Summary (Last 24 hours) at 04/10/14 1453 Last data filed at 04/10/14 1300  Gross per 24 hour  Intake 2451.67 ml  Output    400 ml  Net 2051.67 ml   Filed Weights   04/08/14 2051 04/09/14 0258  Weight: 83.915 kg (185 lb) 81.874 kg (180 lb 8 oz)    Exam:   General:  Alert in no distress.   Cardiovascular: S 1, S 2 RRR  Respiratory: CTA  Abdomen: bs present, soft, nt  Musculoskeletal: no edema.   Neuro exam: Alert , not oriented, confuse, expressive aphasia. Motor strength 5-5  Data Reviewed: Basic Metabolic Panel:  Recent Labs Lab 04/08/14 2143 04/08/14 2151 04/09/14 1210 04/10/14 0604  NA 130* 134* 140 141  K 3.9 4.0 3.8 4.3  CL 98 100 109 113*  CO2 22  --  25 24  GLUCOSE 663* 665* 181* 196*  BUN 21 27* 18 16  CREATININE 1.65* 1.40* 1.15 1.16  CALCIUM 9.0  --  9.1 8.8    Liver Function Tests:  Recent Labs Lab 04/08/14 2143  AST 23  ALT 19  ALKPHOS 95  BILITOT 0.4  PROT 6.7  ALBUMIN 3.6   No results for input(s): LIPASE, AMYLASE in the last 168 hours. No results for input(s): AMMONIA in the last 168 hours. CBC:  Recent Labs Lab 04/08/14 2143 04/08/14 2151 04/09/14 1210 04/10/14 0604  WBC 9.3  --  9.7 6.7  NEUTROABS 7.4  --   --   --   HGB 13.3 15.0 12.6* 11.6*  HCT 40.7 44.0 36.8* 35.5*  MCV 95.8  --  93.4 94.7  PLT 250  --  235 205   Cardiac Enzymes: No results for input(s): CKTOTAL, CKMB, CKMBINDEX, TROPONINI in the last 168 hours. BNP (last 3 results) No results for input(s): PROBNP in the last 8760 hours. CBG:  Recent Labs Lab 04/09/14 1210 04/09/14 1640 04/09/14 2219 04/10/14 0654 04/10/14 1142  GLUCAP 167* 194* 163* 170* 208*    Recent Results (from the past 240 hour(s))  Culture, Urine     Status: None (Preliminary result)   Collection Time: 04/09/14  9:42 AM  Result Value Ref Range Status   Specimen Description URINE, CLEAN CATCH  Final   Special Requests NONE  Final   Colony Count   Final    >=100,000 COLONIES/ML Performed at [facility]   Final  ENTEROCOCCUS SPECIES Performed at [facility]    Report Status PENDING  Incomplete     Studies: Dg Chest 2 View  04/08/2014   CLINICAL DATA:  Altered mental status. Stroke 3 weeks ago. Speech problems and difficulty walking since yesterday.  EXAM: CHEST  2 VIEW  COMPARISON:  03/10/2011  FINDINGS: Heart and mediastinal contours are within normal limits. No focal opacities or effusions. No acute bony abnormality.  IMPRESSION: No active cardiopulmonary disease.   Electronically Signed   By: Rolm Baptise [name]   On: 04/08/2014 22:29   Ct Head (brain) Wo Contrast  04/08/2014   CLINICAL DATA:  Speech and difficulty walking since yesterday. History of stroke 3 weeks ago.  EXAM: CT HEAD WITHOUT CONTRAST  TECHNIQUE: Contiguous axial images  were obtained from the base of the skull through the vertex without intravenous contrast.  COMPARISON:  02/26/2014  FINDINGS: Punctate low-density areas noted throughout the left cerebral hemisphere, likely related to the previously seen left cerebral infarcts by previous MRI.  Old infarcts in the right cerebellum. No acute infarct. No hemorrhage or hydrocephalus. No mass lesion or midline shift.  No acute calvarial abnormality. Visualized paranasal sinuses and mastoids clear. Orbital soft tissues unremarkable.  IMPRESSION: Punctate low-density areas throughout the left cerebral hemisphere corresponding to the areas of infarct seen on previous/recent MRI. No acute infarction.   Electronically Signed   By: Rolm Baptise [name]   On: 04/08/2014 22:18   Mri Brain Without Contrast  04/09/2014   CLINICAL DATA:  Initial evaluation for 2-3 days of speech difficulty and gait instability. Recent stroke.  EXAM: MRI HEAD WITHOUT CONTRAST  MRA HEAD WITHOUT CONTRAST  TECHNIQUE: Multiplanar, multiecho pulse sequences of the brain and surrounding structures were obtained without intravenous contrast. Angiographic images of the head were obtained using MRA technique without contrast.  COMPARISON:  Prior CT from 04/08/2014 as well as previous MRI from 02/26/2014  FINDINGS: MRI HEAD FINDINGS  Multiple acute nonhemorrhagic ischemic infarcts seen scattered throughout the left cerebral hemisphere, involving the left frontal and parietal lobes, as well as the left temporal lobe at the temporoparietal junction. And left temporal lobes. These involve both the cortical gray matter and deep white matter of the left centrum semi ovale. No associated hemorrhage or significant mass effect. No intracranial hemorrhage.  Remote bilateral cerebellar infarcts again noted, right greater than left. Remote infarct within the right thalamus also again noted. Chronic small vessel ischemic changes again noted, stable.  No mass lesion or midline shift.   No hydrocephalus.  Craniocervical junction within normal limits. Degenerative changes noted within the upper cervical spine. Pituitary gland normal. No acute abnormality seen about the orbits.  Paranasal sinuses clear.  No mastoid effusion.  MRA HEAD FINDINGS  ANTERIOR CIRCULATION:  Again, the left internal carotid artery is occluded. There is some reconstitution at the left supra clinoid ICA, likely via collateral flow cross the circle of Willis. Flow within the left middle cerebral artery and distal MCA branches is markedly attenuated and diminutive as compared to the contralateral right. This finding is similar relative to prior study.  Visualized distal cervical segment of the internal carotid artery widely patent with antegrade flow. Petrous segment widely patent. There is short-segment moderate to severe stenosis of the cavernous segment of the right ICA, also stable. Supra clinoid right ICA widely patent.  Right A1 segment is widely patent. The left A1 segment is poorly opacified, and may be hypoplastic or containing high-grade stenosis. Anterior communicating artery grossly  normal. The A2 segments are widely patent bilaterally.  POSTERIOR CIRCULATION:  The right vertebral artery is occluded. Right posterior inferior cerebral artery not well evaluated, likely occluded as well. Multi focal stenoses, 1 of which is high-grade present within the distal left vertebral artery. B basilar artery is moderately marrow proximally.  Left P1 and P2 segments are widely patent. Right P1 segment not well visualized. The distal right PCA is not visualized as well, and may be occluded.  No aneurysm or vascular malformation.  IMPRESSION: MRI HEAD IMPRESSION:  1. Multi focal nonhemorrhagic ischemic infarcts involving the left frontal and parietal lobes as well as the posterior left temporal lobe at the temporoparietal junction. No significant mass effect. 2. Stable appearance of remote bilateral cerebellar infarcts and remote  lacunar infarct within the right thalamus. 3. Atrophy with chronic microvascular ischemic disease.  MRA HEAD IMPRESSION:  1. Occluded left internal carotid artery. There is some distal reconstitution at the supra clinoid left ICA, however, this is likely inadequate as flow within the left middle cerebral artery and its branches is markedly attenuated. 2. Focal moderate to severe stenosis within the cavernous right ICA. 3. Occluded right vertebral artery. 4. Multi focal moderate to severe stenosis within the distal left vertebral artery with moderate narrowing of the proximal basilar artery. 5. Poor visualization of the right P1 segment, with nonvisualization of the distal right P2 segment, which may be occluded. 6. Overall, these findings are not significantly changed relative to prior MRA from 02/26/2014.   Electronically Signed   By: Jeannine Boga [name]   On: 04/09/2014 06:41   Mr Jodene Nam Head/brain Wo Cm  04/09/2014   CLINICAL DATA:  Initial evaluation for 2-3 days of speech difficulty and gait instability. Recent stroke.  EXAM: MRI HEAD WITHOUT CONTRAST  MRA HEAD WITHOUT CONTRAST  TECHNIQUE: Multiplanar, multiecho pulse sequences of the brain and surrounding structures were obtained without intravenous contrast. Angiographic images of the head were obtained using MRA technique without contrast.  COMPARISON:  Prior CT from 04/08/2014 as well as previous MRI from 02/26/2014  FINDINGS: MRI HEAD FINDINGS  Multiple acute nonhemorrhagic ischemic infarcts seen scattered throughout the left cerebral hemisphere, involving the left frontal and parietal lobes, as well as the left temporal lobe at the temporoparietal junction. And left temporal lobes. These involve both the cortical gray matter and deep white matter of the left centrum semi ovale. No associated hemorrhage or significant mass effect. No intracranial hemorrhage.  Remote bilateral cerebellar infarcts again noted, right greater than left. Remote infarct  within the right thalamus also again noted. Chronic small vessel ischemic changes again noted, stable.  No mass lesion or midline shift.  No hydrocephalus.  Craniocervical junction within normal limits. Degenerative changes noted within the upper cervical spine. Pituitary gland normal. No acute abnormality seen about the orbits.  Paranasal sinuses clear.  No mastoid effusion.  MRA HEAD FINDINGS  ANTERIOR CIRCULATION:  Again, the left internal carotid artery is occluded. There is some reconstitution at the left supra clinoid ICA, likely via collateral flow cross the circle of Willis. Flow within the left middle cerebral artery and distal MCA branches is markedly attenuated and diminutive as compared to the contralateral right. This finding is similar relative to prior study.  Visualized distal cervical segment of the internal carotid artery widely patent with antegrade flow. Petrous segment widely patent. There is short-segment moderate to severe stenosis of the cavernous segment of the right ICA, also stable. Supra clinoid right ICA widely patent.  Right A1 segment is widely patent. The left A1 segment is poorly opacified, and may be hypoplastic or containing high-grade stenosis. Anterior communicating artery grossly normal. The A2 segments are widely patent bilaterally.  POSTERIOR CIRCULATION:  The right vertebral artery is occluded. Right posterior inferior cerebral artery not well evaluated, likely occluded as well. Multi focal stenoses, 1 of which is high-grade present within the distal left vertebral artery. B basilar artery is moderately marrow proximally.  Left P1 and P2 segments are widely patent. Right P1 segment not well visualized. The distal right PCA is not visualized as well, and may be occluded.  No aneurysm or vascular malformation.  IMPRESSION: MRI HEAD IMPRESSION:  1. Multi focal nonhemorrhagic ischemic infarcts involving the left frontal and parietal lobes as well as the posterior left temporal  lobe at the temporoparietal junction. No significant mass effect. 2. Stable appearance of remote bilateral cerebellar infarcts and remote lacunar infarct within the right thalamus. 3. Atrophy with chronic microvascular ischemic disease.  MRA HEAD IMPRESSION:  1. Occluded left internal carotid artery. There is some distal reconstitution at the supra clinoid left ICA, however, this is likely inadequate as flow within the left middle cerebral artery and its branches is markedly attenuated. 2. Focal moderate to severe stenosis within the cavernous right ICA. 3. Occluded right vertebral artery. 4. Multi focal moderate to severe stenosis within the distal left vertebral artery with moderate narrowing of the proximal basilar artery. 5. Poor visualization of the right P1 segment, with nonvisualization of the distal right P2 segment, which may be occluded. 6. Overall, these findings are not significantly changed relative to prior MRA from 02/26/2014.   Electronically Signed   By: Jeannine Boga [name]   On: 04/09/2014 06:41   Ir Angio Intra Extracran Sel Com Carotid Innominate Bilat Mod Sed  04/10/2014   CLINICAL DATA:  New onset aphasia with right-sided weakness. Multiple small ischemic strokes in the watershed area of the left cerebral hemisphere.  EXAM: BILATERAL COMMON CAROTID AND INNOMINATE ANGIOGRAPHY AND RIGHT VERTEBRAL ARTERY ANGIOGRAMS  PROCEDURE: Contrast: 60 mL OMNIPAQUE IOHEXOL 300 MG/ML  SOLN  Anesthesia/Sedation:  Conscious sedation.  Medications: Versed  1 mg IV.  Following a full explanation of the procedure along with the potential associated complications, an informed witnessed consent was obtained.  The right groin was prepped and draped in the usual sterile fashion. Thereafter using modified Seldinger technique, transfemoral access into the right common femoral artery was obtained without difficulty. Over a 0.035 inch guidewire, a 5 French Pinnacle sheath was inserted. Through this, and also over  0.035 inch guidewire, a 5 French JB1 catheter was advanced to the aortic arch region and selectively positioned in the right common carotid artery, the left common carotid artery and the left vertebral artery.  There were no acute complications. The patient tolerated the procedure well.  FINDINGS: The right common carotid arteriogram demonstrates mild narrowing of the origin of the right external carotid artery. Its branches are normally opacified.  The right internal carotid artery at the bulb has minimal atherosclerotic plaque without significant stenosis by NASCET criteria.  The vessel is otherwise seen to opacify normally to the cranial skull base.  The cervical petrous junction is normal.  The proximal cavernous segment demonstrates a tapered severe stenoses of about 70%. Distal to this and distal to the origin of the ophthalmic artery is another area of approximately 50-60% stenosis.  Distal to this the supraclinoid segment is widely patent.  The right middle and the right  anterior cerebral artery opacify normally into the capillary and the venous phases.  Prompt cross-filling via the anterior communicating artery of the left anterior cerebral artery A2 segment is seen with flash filling of the left A1 segment and also flow into the left middle cerebral artery distribution.  The left common carotid arteriogram demonstrates the left external carotid artery and its major branches to be widely patent.  The left internal carotid artery at the bulb is widely patent.  The delayed arterial images demonstrates antegrade flow albeit slowly into the left cranial skull base and into the cavernous segment of the left internal carotid artery.  Relative stagnation of flow is noted in the region with subsequent clearance.  There is also noted to be opacification of the distal cavernous and the supraclinoid segments of the left internal carotid artery from the ipsilateral ophthalmic artery. The previously noted area of  narrowing just distal to the origin of the ophthalmic artery appears narrower compared to the previous arteriogram.  More distally there is flow noted into the left middle cerebral artery distribution relatively slow compared to the external carotid artery circulation. Opacification of the proximal left anterior cerebral artery is seen also.  The left vertebral artery origin demonstrates approximately 60% narrowing. Tortuosity of the vessel is seen in the mid cervical portion with associated narrowing of approximately 30-40%.  More distally the vessel is seen to opacify to the cranial skull base.  Again demonstrated is the approximately 60-70% stenosis of the left vertebrobasilar junction distal to the origin of the left posterior inferior cerebellar artery.  The basilar artery, the left posterior cerebral artery, superior cerebellar arteries and the anterior inferior cerebellar arteries are seen to opacify into the capillary and the venous phases.  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  Nonvisualization of the right posterior cerebral artery is seen.  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  Approximately 70% narrowing of the right internal carotid artery proximal cavernous segment, with an approximately 50-60% narrowing of the distal cavernous segment of the right internal carotid artery.  Approximately 60% narrowing of the origin of the dominant left vertebral artery, with nonvisualization of the right posterior cerebral artery from this injection.  Approximately 60- 70% narrowing left vertebral basilar junction.  The angiographic findings were reviewed with the  patient, his wife and his daughter.   Electronically Signed   By: Luanne Bras [name]   On: 04/10/2014 10:34   Ir Angio Vertebral Sel Subclavian Innominate Uni L Mod Sed  04/10/2014   CLINICAL DATA:  New onset aphasia with right-sided weakness. Multiple small ischemic strokes in the watershed area of the left cerebral hemisphere.  EXAM: BILATERAL COMMON CAROTID AND INNOMINATE ANGIOGRAPHY AND RIGHT VERTEBRAL ARTERY ANGIOGRAMS  PROCEDURE: Contrast: 60 mL OMNIPAQUE IOHEXOL 300 MG/ML  SOLN  Anesthesia/Sedation:  Conscious sedation.  Medications: Versed  1 mg IV.  Following a full explanation of the procedure along with the potential associated complications, an informed witnessed consent was obtained.  The right groin was prepped and draped in the usual sterile fashion. Thereafter using modified Seldinger technique, transfemoral access into the right common femoral artery was obtained without difficulty. Over a 0.035 inch guidewire, a 5 French Pinnacle sheath was inserted.  Through this, and also over 0.035 inch guidewire, a 5 French JB1 catheter was advanced to the aortic arch region and selectively positioned in the right common carotid artery, the left common carotid artery and the left vertebral artery.  There were no acute complications. The patient tolerated the procedure well.  FINDINGS: The right common carotid arteriogram demonstrates mild narrowing of the origin of the right external carotid artery. Its branches are normally opacified.  The right internal carotid artery at the bulb has minimal atherosclerotic plaque without significant stenosis by NASCET criteria.  The vessel is otherwise seen to opacify normally to the cranial skull base.  The cervical petrous junction is normal.  The proximal cavernous segment demonstrates a tapered severe stenoses of about 70%. Distal to this and distal to the origin of the ophthalmic artery is another area of approximately 50-60% stenosis.  Distal to this the  supraclinoid segment is widely patent.  The right middle and the right anterior cerebral artery opacify normally into the capillary and the venous phases.  Prompt cross-filling via the anterior communicating artery of the left anterior cerebral artery A2 segment is seen with flash filling of the left A1 segment and also flow into the left middle cerebral artery distribution.  The left common carotid arteriogram demonstrates the left external carotid artery and its major branches to be widely patent.  The left internal carotid artery at the bulb is widely patent.  The delayed arterial images demonstrates antegrade flow albeit slowly into the left cranial skull base and into the cavernous segment of the left internal carotid artery.  Relative stagnation of flow is noted in the region with subsequent clearance.  There is also noted to be opacification of the distal cavernous and the supraclinoid segments of the left internal carotid artery from the ipsilateral ophthalmic artery. The previously noted area of narrowing just distal to the origin of the ophthalmic artery appears narrower compared to the previous arteriogram.  More distally there is flow noted into the left middle cerebral artery distribution relatively slow compared to the external carotid artery circulation. Opacification of the proximal left anterior cerebral artery is seen also.  The left vertebral artery origin demonstrates approximately 60% narrowing. Tortuosity of the vessel is seen in the mid cervical portion with associated narrowing of approximately 30-40%.  More distally the vessel is seen to opacify to the cranial skull base.  Again demonstrated is the approximately 60-70% stenosis of the left vertebrobasilar junction distal to the origin of the left posterior inferior cerebellar artery.  The basilar artery, the left posterior cerebral artery, superior cerebellar arteries and the anterior inferior cerebellar arteries are seen to opacify into the  capillary and the venous phases.  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  Nonvisualization of the right posterior cerebral artery is seen.  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  Approximately 70% narrowing of the right internal carotid artery proximal cavernous segment, with an approximately 50-60% narrowing of the distal cavernous segment of the right internal carotid artery.  Approximately 60% narrowing of the origin of the dominant left vertebral artery, with nonvisualization of the right posterior cerebral artery from this injection.  Approximately 60- 70% narrowing left vertebral basilar junction.  The  angiographic findings were reviewed with the patient, his wife and his daughter.   Electronically Signed   By: Luanne Bras [name]   On: 04/10/2014 10:34    Scheduled Meds: . allopurinol  300 mg Oral Daily  . aspirin EC  81 mg Oral Daily  . atorvastatin  80 mg Oral q1800  . clopidogrel  75 mg Oral Daily  . [START ON 04/11/2014] enoxaparin (LOVENOX) injection  40 mg Subcutaneous Q24H  . glyBURIDE  5 mg Oral Q breakfast  . heparin      . hydrALAZINE      . insulin aspart  0-15 Units Subcutaneous TID WC  . insulin glargine  10 Units Subcutaneous QHS  . lidocaine      . midazolam      . nortriptyline  25 mg Oral QHS  . pantoprazole  40 mg Oral Daily  . sertraline  50 mg Oral QHS   Continuous Infusions: . sodium chloride 100 mL/hr at 04/10/14 1236    Principal Problem:   TIA (transient ischemic attack) Active Problems:   HYPERCHOLESTEROLEMIA   Essential hypertension   Hyperglycemia   Confusion   Diabetes mellitus type II, uncontrolled    Acute, but ill-defined, cerebrovascular disease   Cerebrovascular disease   HLD (hyperlipidemia)   Carotid stenosis    Time spent: 35 minutes.     Niel Hummer A  [facility] Pager [phone]. If 7PM-7AM, please contact night-coverage at [email or web address], password [facility] 04/10/2014, 2:53 PM  LOS: 2 days

## 2014-04-10 NOTE — Evaluation (Signed)
Speech Language Pathology Evaluation Patient Details Name: Peter Nicholson MRN: [id number] DOB: 05/22/1940 Today's Date: 04/10/2014 Time: 9678-9381 SLP Time Calculation (min) (ACUTE ONLY): 25 min  Problem List:  Patient Active Problem List   Diagnosis Date Noted  . Hyperglycemia 04/09/2014  . Confusion 04/09/2014  . Diabetes mellitus type II, uncontrolled 04/09/2014  . Acute, but ill-defined, cerebrovascular disease   . Cerebrovascular disease   . HLD (hyperlipidemia)   . Carotid stenosis   . Carotid artery stenosis with cerebral infarction less than 8 weeks ago   . Tachycardia 02/26/2014  . Dysarthria 02/26/2014  . CVA (cerebral infarction)   . Dysesthesia 06/10/2012  . Abdominal bruit 06/10/2012  . Other specified transient cerebral ischemias 06/10/2012  . Occlusion and stenosis of carotid artery without mention of cerebral infarction 06/10/2012  . Sudden visual loss 06/10/2012  . Memory impairment 04/25/2012  . Dysphagia 01/18/2012  . Renal cell cancer 01/18/2012  . Acute UTI 04/28/2011  . Renal insufficiency 04/28/2011  . Dysuria 04/27/2011  . Acute blood loss anemia 03/27/2011  . TIA (transient ischemic attack) 03/16/2011  . Closed fracture of tibial plateau 03/11/2011  . VITAMIN B12 DEFICIENCY 07/25/2008  . OVERWEIGHT 06/06/2008  . ESOPHAGEAL STRICTURE 03/14/2008  . CONSTIPATION 03/14/2008  . OBSTRUCTIVE SLEEP APNEA 11/28/2007  . CEREBROVASCULAR DISEASE 11/28/2007  . VENOUS INSUFFICIENCY 11/28/2007  . DIVERTICULOSIS OF COLON 11/28/2007  . NEPHROLITHIASIS 11/28/2007  . HORSESHOE KIDNEY 11/28/2007  . Diabetes 03/22/2007  . HYPERCHOLESTEROLEMIA 03/22/2007  . GOUT 03/22/2007  . ANXIETY 03/22/2007  . Essential hypertension 03/22/2007  . GERD 03/22/2007  . HIATAL HERNIA 03/22/2007  . DEGENERATIVE JOINT DISEASE 03/22/2007  . BACK PAIN, LUMBAR 03/22/2007   Past Medical History:  Past Medical History  Diagnosis Date  . Obstructive sleep apnea (adult) (pediatric)      doesnt wear CPAP  . HTN (hypertension)   . Cerebrovascular disease, unspecified   . Unspecified venous (peripheral) insufficiency   . Pure hypercholesterolemia   . Type II or unspecified type diabetes mellitus without mention of complication, not stated as uncontrolled   . Overweight(278.02)   . Diaphragmatic hernia without mention of obstruction or gangrene   . Esophageal reflux   . Diverticulosis of colon (without mention of hemorrhage)   . Other specified congenital anomaly of kidney   . Kidney carcinoma   . Calculus of kidney   . Gout, unspecified   . Lumbago   . Anxiety state, unspecified   . History of gallstones   . IBS (irritable bowel syndrome)   . History of seizures   . Bowel obstruction   . Osteoarthrosis, unspecified whether generalized or localized, unspecified site     tendonitis  . Blood transfusion without reported diagnosis   . Stroke     2001  . Seizures     last seizure November 2012   Past Surgical History:  Past Surgical History  Procedure Laterality Date  . Incisional hernia repair  1/96    Dr.Leone  . Nasal sinus surgery  12/96    Dr.Redman  . Cholecystectomy  9/04    Dr. Rebekah Chesterfield  . 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 Carotid endarterectomy  09/2000    Dr.Lawson  . Veterbral art angioplasty      x2  . Small intestine surgery    . External fixation leg  03/10/2011    Procedure: EXTERNAL FIXATION LEG;  Surgeon: Rozanna Box;  Location: [place];  Service: Orthopedics;  Laterality: Right;  . Orif tibia plateau  03/24/2011    Procedure: OPEN REDUCTION INTERNAL FIXATION (ORIF) TIBIAL PLATEAU;  Surgeon: Rozanna Box;  Location: [place];  Service: Orthopedics;  Laterality: Right;   HPI:  73 yo male with hx of htn, dm2, CVA c/o dysarthria beginning about 9:30pm. Pt states that he had difficulty expressing himself. Pt brought to ED and CT scan was negative for any acute process. MRI: Multi focal nonhemorrhagic  ischemic infarcts involving the left frontal and pareital lobes as well as the posterior left temporal lobe at the temproparietal junction. Pt was recently admitted in 11/15 with left watershed infarcts of the MCA due to left ICA occlusion, as well as h/o right CVA in 2001.   Assessment / Plan / Recommendation Clinical Impression  Pt presents with a moderate expressive > receptive aphasia which pt and wife believe to be exacerbated by acute CVA. Pt required Mod cues from SLP for confrontational naming tasks with common objects, as well as for emergent awareness of linguistic errors. Pt is perseverative both in verbal expression and with reading, again with decreased awareness of perseverative errors. Pt will benefit from skilled SLP services. Recommend f/u OP SLP services and 24/7 supervision upon return home.    SLP Assessment  Patient needs continued Speech Lanaguage Pathology Services    Follow Up Recommendations  Outpatient SLP;24 hour supervision/assistance    Frequency and Duration min 2x/week  2 weeks   Pertinent Vitals/Pain Pain Assessment: No/denies pain   SLP Goals  Patient/Family Stated Goal: none stated Potential to Achieve Goals (ACUTE ONLY): Good Potential Considerations (ACUTE ONLY): Previous level of function  SLP Evaluation Prior Functioning  Cognitive/Linguistic Baseline: Within functional limits Type of Home: House  Lives With: Spouse;Daughter Available Help at Discharge: Family;Available 24 hours/day   Cognition  Overall Cognitive Status: Impaired/Different from baseline Arousal/Alertness: Awake/alert Orientation Level: Oriented to person;Oriented to place;Oriented to situation;Disoriented to time Attention: Sustained Sustained Attention: Appears intact Awareness: Impaired Awareness Impairment: Emergent impairment;Anticipatory impairment Safety/Judgment: Impaired    Comprehension  Auditory Comprehension Overall Auditory Comprehension: Impaired Commands:  Impaired One Step Basic Commands: 50-74% accurate Conversation: Simple Visual Recognition/Discrimination Discrimination: Within Function Limits Reading Comprehension Reading Status: Impaired Word level: Impaired    Expression Expression Primary Mode of Expression: Verbal Verbal Expression Overall Verbal Expression: Impaired Initiation: No impairment Level of Generative/Spontaneous Verbalization: Phrase;Sentence Repetition: No impairment (appears intact at word level) Naming: Impairment Confrontation: Impaired Verbal Errors: Perseveration;Not aware of errors;Aware of errors (intermittently has some awareness)   Oral / Motor Motor Speech Overall Motor Speech: Appears within functional limits for tasks assessed   GO      Germain Osgood, M.A. CCC-SLP [phone]  Germain Osgood 04/10/2014, 2:18 PM

## 2014-04-10 NOTE — Progress Notes (Signed)
STROKE TEAM PROGRESS NOTE   HISTORY Peter Nicholson is an 73 y.o. male history of TIA, DM2, HTN, and hyperlipidemia and recent CVA (discharged 02/2014) with acute left MCA infarcts. Presents to the ED 04/08/2014 with complaints of confusion, difficulty speaking and ataxia since Saturday 04/07/2014 at 1200 (LKW).His family is at the bedside and report that he has had difficulty getting words out and slurring of his speech.He was found to have a CT scan of the Brain which was negatie for acute findings (imaging reviewed), and his labs reveal a glucose of 663. His family report that he takes his medications, but he does not check his glucose levels because they found his glucometer and it was collecting dust.  At prior admission in November he had a angiogram done showing "severe preocclusive stenosis of left ICA, 80-85% stenosis of right ICA, 80% stenosis of dominant left vert and occluded right vert. With this stroke he had moderate aphasia. They state current symptoms are similar to prior stroke in November.   Of note, the family feels his symptoms (especially speech) have gotten much worse within the past few minutes. This does appear to coincide with him receiving a dose of hydralazine for a SBP of 168/75.  Patient was not administered TPA secondary to delay in arrival. He was admitted for further evaluation and treatment.   SUBJECTIVE (INTERVAL HISTORY) His wife is at the bedside. He had an angio this am. Neuro stable. No overnight acute issues. BP stable.   OBJECTIVE Temp:  [97.8 F (36.6 C)-98.6 F (37 C)] 98.4 F (36.9 C) (12/29 0619) Pulse Rate:  [79-104] 92 (12/29 0619) Cardiac Rhythm:  [-] Normal sinus rhythm (12/28 2000) Resp:  [18-20] 18 (12/29 0619) BP: (122-169)/(55-88) 133/55 mmHg (12/29 0619) SpO2:  [97 %-100 %] 100 % (12/29 0619)   Recent Labs Lab 04/09/14 0722 04/09/14 1210 04/09/14 1640 04/09/14 2219 04/10/14 0654  GLUCAP 336* 167* 194* 163* 170*     Recent Labs Lab 04/08/14 2143 04/08/14 2151 04/09/14 1210 04/10/14 0604  NA 130* 134* 140 141  K 3.9 4.0 3.8 4.3  CL 98 100 109 113*  CO2 22  --  25 24  GLUCOSE 663* 665* 181* 196*  BUN 21 27* 18 16  CREATININE 1.65* 1.40* 1.15 1.16  CALCIUM 9.0  --  9.1 8.8    Recent Labs Lab 04/08/14 2143  AST 23  ALT 19  ALKPHOS 95  BILITOT 0.4  PROT 6.7  ALBUMIN 3.6    Recent Labs Lab 04/08/14 2143 04/08/14 2151 04/09/14 1210 04/10/14 0604  WBC 9.3  --  9.7 6.7  NEUTROABS 7.4  --   --   --   HGB 13.3 15.0 12.6* 11.6*  HCT 40.7 44.0 36.8* 35.5*  MCV 95.8  --  93.4 94.7  PLT 250  --  235 205   No results for input(s): CKTOTAL, CKMB, CKMBINDEX, TROPONINI in the last 168 hours.  Recent Labs  04/08/14 2143  LABPROT 14.0  INR 1.07    Recent Labs  04/08/14 2234  COLORURINE YELLOW  LABSPEC 1.028  PHURINE 5.5  GLUCOSEU >1000*  HGBUR SMALL*  BILIRUBINUR NEGATIVE  KETONESUR NEGATIVE  PROTEINUR 30*  UROBILINOGEN 0.2  NITRITE NEGATIVE  LEUKOCYTESUR MODERATE*       Component Value Date/Time   CHOL 175 04/09/2014 0550   TRIG 105 04/09/2014 0550   HDL 41 04/09/2014 0550   CHOLHDL 4.3 04/09/2014 0550   VLDL 21 04/09/2014 0550   LDLCALC 113* 04/09/2014 0550  Lab Results  Component Value Date   HGBA1C 9.3* 04/09/2014      Component Value Date/Time   LABOPIA NONE DETECTED 04/09/2014 0730   COCAINSCRNUR NONE DETECTED 04/09/2014 0730   LABBENZ NONE DETECTED 04/09/2014 0730   AMPHETMU NONE DETECTED 04/09/2014 0730   THCU NONE DETECTED 04/09/2014 0730   LABBARB NONE DETECTED 04/09/2014 0730    No results for input(s): ETH in the last 168 hours.  Dg Chest 2 View   04/08/2014   IMPRESSION: No active cardiopulmonary disease.      Ct Head (brain) Wo Contrast  04/08/2014  IMPRESSION: Punctate low-density areas throughout the left cerebral hemisphere corresponding to the areas of infarct seen on previous/recent MRI. No acute infarction.      Mri and Mra  Head/brain Wo Cm  04/09/2014   IMPRESSION: MRI HEAD IMPRESSION:  1. Multi focal nonhemorrhagic ischemic infarcts involving the left frontal and parietal lobes as well as the posterior left temporal lobe at the temporoparietal junction. No significant mass effect. 2. Stable appearance of remote bilateral cerebellar infarcts and remote lacunar infarct within the right thalamus. 3. Atrophy with chronic microvascular ischemic disease.  MRA HEAD IMPRESSION:  1. Occluded left internal carotid artery. There is some distal reconstitution at the supraclinoid left ICA, however, this is likely inadequate as flow within the left middle cerebral artery and its branches is markedly attenuated. 2. Focal moderate to severe stenosis within the cavernous right ICA. 3. Occluded right vertebral artery. 4. Multi focal moderate to severe stenosis within the distal left vertebral artery with moderate narrowing of the proximal basilar artery. 5. Poor visualization of the right P1 segment, with nonvisualization of the distal right P2 segment, which may be occluded. 6. Overall, these findings are not significantly changed relative to prior MRA from 02/26/2014.      2D Echocardiogram 02/2014  Left ventricle: The cavity size was normal. There was mild focal basal hypertrophy of the septum. Systolic function was at the lower limits of normal. The estimated ejection fraction was in the range of 50% to 55%.  Carotid Doppler   02/2014 1-39% right internal carotid artery stenosis. The left internal carotid artery waveform is atypical, suggestive of a possible distal obstruction. Vertebral arteries are patent with antegrade flow.   Cerebral Angiogram 04/10/2014 1 Approx 70 % stenosis of prox cavernous RT ICA. 2 Approx 60 % stenosis of dominant LT VA at origin. 3 Interval progression of stenosis of LT ICA distal to origin of ophthalmic artery with severe near complete occlusion of prox cavernous Lt ICA   PHYSICAL EXAM  General - Well  nourished, well developed, in no apparent distress.  Ophthalmologic - not cooperative on exam.  Cardiovascular - Regular rate and rhythm with no murmur.  Mental Status -  Awake alert and orientated to self and place but hard to tell time. Language was assessed and found to have expressive aphasia, difficulty with naming and repetition. Comprehension intact largely.   Cranial Nerves II - XII - II - Visual field intact OU. III, IV, VI - Extraocular movements intact. V - Facial sensation intact bilaterally. VII - Facial movement intact bilaterally. VIII - Hearing & vestibular intact bilaterally. X - Palate elevates symmetrically. XI - Chin turning & shoulder shrug intact bilaterally. XII - Tongue protrusion intact.  Motor Strength - The patient's strength was normal in all extremities but pronator drift was present on the right.  Bulk was normal and fasciculations were absent.   Motor Tone - Muscle tone was  assessed at the neck and appendages and was normal.  Reflexes - The patient's reflexes were 1+ in all extremities and he had no pathological reflexes.  Sensory - Light touch, temperature/pinprick were assessed and were normal.    Coordination - The patient had normal movements in the hands and feet with no ataxia or dysmetria.  Tremor was absent.  Gait and Station - not tested.   ASSESSMENT/PLAN Peter Nicholson is a 73 y.o. male with history of TIA, DM2, HTN, and hyperlipidemia and recent CVA (discharged 02/2014) with acute left MCA infarcts. Presents with complaints of confusion, difficulty speaking and ataxia. He did not receive IV t-PA  due to delay in arrival.   Stroke:  Repeat Dominant multiple left frontal, parietal and temporal infarcts, in watershed distribution, felt to be ? thromboembolic secondary to stump emboli from L ICA or from hypoperfusion due to L ICA near occlusion following similar stroke 1 month ago. Pt had previous left ICA CEA but apparently  re-stenosed.  Resultant  Expressive aphasia   MRI   Multi focal left frontal, left parietal and left temporal lobe infarcts. Old bilateral cerebellar infarcts and old right thalamus lacune.   MRA  Unchanged since Nov 2015. - Occluded left ICA -cavernous right ICA focal moderate to severe stenosis  -R VA occluded -distal left vertebral artery multi focal moderate to severe stenosis with moderate narrowing of proximal BA  Carotid Doppler  L ICA distal obstruction, right ICA 1-39%  2D Echo  No source of embolus   Angio Nov 2015 severe preocclusive L ICA, R ICA 80-85%, L VBJ 80%, occluded R VBJ at C1  Cerebral angio today - R ICA 70%, L VA 60%, L ICA w/ interval progression of stenosis to near occlusion prox ICA distal to origin of opthalmic artery - Dr. Erlinda Hong to discuss treatment plan with Dr. Estanislado Pandy   Lovenox 40 mg sq daily for VTE prophylaxis  Diet NPO time specified Except for: Sips with Meds   aspirin 81 mg orally every day and clopidogrel 75 mg orally every day prior to admission, placed on last admission x 3 months then one alone; now on aspirin 81 mg orally every day and clopidogrel 75 mg orally every day.   Patient counseled to be compliant with his antithrombotic medications  Ongoing aggressive stroke risk factor management  Therapy recommendations:  No PT or OT f/u  Disposition:  return home  Hypertension  Acute neuro worsening after receiving hydralazine in ED for SBP 168  Does not treat unless SBP > 180  Keep BP 130-160  IVF 100cc/hr BP 122-169/66-88 past 24h (04/10/2014 @ 9:08 AM)  Avoid hypotension due to critical stenosis of left ICA   Hyperlipidemia  Home meds:  zocor 40,  resumed in hospital  LDL 113, goal < 70  Switched to high dose lipitor 80  Continue lipitor at discharge  Diabetes  HgbA1c 9.3, goal < 7.0  Uncontrolled   On insulin and gliburide  SSI  DM education.  Manage pre primary team. Need aggressive control  Other Stroke Risk  Factors  Advanced age  ETOH use  Hx stroke/TIA  Nov 2015 L MCA/ACA, ACA/PCA watershed infarcts thromboembolic secondary to proximal LICA occlusion with resultantmoderate expressive aphasia. D/c on dual antiplatelets.  Nov 2012 L brain TIA off Plavix in prep for leg surgery  2002 VB TIA, angioplasty of lesion  07/1999 - BA occlusion, L ICA stenosis, asx, resultant L CEA  Obstructive sleep apnea, doesn't wear CPAP  Other Active Problems  Pyuria, C&S pending  Mild memory problems - evaluated as an OP 06/2012   Other Pertinent History  Kidney cancer  Gout  IBS  Seizures , last seizure Nov 2012  GERD  Hospital day # [place] for Pager information 04/10/2014 10:36 AM   I, the attending vascular neurologist, have personally obtained a history, examined the patient, evaluated laboratory data, individually viewed imaging studies and agree with radiology interpretations. I also discussed with Dr. Estanislado Pandy regarding his care plan. Together with the NP/PA, we formulated the assessment and plan of care which reflects our mutual decision.  I have made any additions or clarifications directly to the above note and agree with the findings and plan as currently documented.   73 yo M with multiple stroke risk factors had previous left CEA as well multiple vessel high grade stenosis. Last month had watershed stroke on the left, this time again more watershed stroke on the left. Also became symptomatic with decreased BP on hydralazine. Angio showed progression of high grade stenosis at distal of cavenous ICA and proximal MCA. Dr. Estanislado Pandy recomended intracranial angioplasty and stent placement. However, on my review of images, there is left ICA high grade stenosis at proximal ICA. May consider carotid stent, will have to discuss with Dr. Estanislado Pandy.   Rosalin Hawking, MD PhD Stroke Neurology 04/10/2014 11:41 PM       To contact Stroke  Continuity provider, please refer to [email or web address] After hours, contact General Neurology

## 2014-04-11 DIAGNOSIS — I639 Cerebral infarction, unspecified: Secondary | ICD-10-CM

## 2014-04-11 LAB — GLUCOSE, CAPILLARY
GLUCOSE-CAPILLARY: 181 mg/dL — AB (ref 70–99)
GLUCOSE-CAPILLARY: 165 mg/dL — AB (ref 70–99)
Glucose-Capillary: 123 mg/dL — ABNORMAL HIGH (ref 70–99)
Glucose-Capillary: 200 mg/dL — ABNORMAL HIGH (ref 70–99)

## 2014-04-11 LAB — URINE CULTURE: Colony Count: >=100000

## 2014-04-11 MED ORDER — AMPICILLIN 500 MG PO CAPS
500.0000 mg | ORAL_CAPSULE | Freq: Three times a day (TID) | ORAL | Status: DC
  Administered 2014-04-11 – 2014-04-13 (×5): 500 mg via ORAL
  Filled 2014-04-11 (×9): qty 1

## 2014-04-11 NOTE — Progress Notes (Signed)
[facility] PROGRESS NOTE  Peter Nicholson VZC:[id number] DOB: 03/05/1941 DOA: 04/08/2014 PCP: Noralee Space, MD  Assessment/Plan: 1-Multiple acute nonhemorrhagic infarct left cerebral hemisphere:  Had Carotid US and 2D ECHO on 02/26/2014. S/P  Angiography 12-29 LDL 113, on statins.  Plan for stent placement by Dr Estanislado Pandy on 12-31.   Encephalopathy: in the event of acute stroke.   Diabetes; Continue with lantus and SSI.   Acute on chronic renal diseases; stage 2  Continue with IV fluids.  Renal function stable.   Hypertension:  PRN hydralazine for SBP more than 180.   Hyperlipidemia;  On statin.  LDL 113.   Pyuria; follow urine culture growing enterococcus. Follow sensitivity. Will start ampicillin.   S/P nephrectomy:   Code Status: Full Code.  Family Communication: care discussed with patient Disposition Plan: remain inpatient   Consultants:  Neurology  Procedures: Had Carotid US and 2D ECHO on 02/26/2014  Antibiotics:  none  HPI/Subjective: Per wife aphasia has improved.  Patient was bale to recognized wife, denies chest pain or dyspnea.    Objective: Filed Vitals:   04/11/14 1021  BP: 143/77  Pulse: 86  Temp: 98.4 F (36.9 C)  Resp: 16    Intake/Output Summary (Last 24 hours) at 04/11/14 1159 Last data filed at 04/11/14 0900  Gross per 24 hour  Intake    720 ml  Output    400 ml  Net    320 ml   Filed Weights   04/08/14 2051 04/09/14 0258  Weight: 83.915 kg (185 lb) 81.874 kg (180 lb 8 oz)    Exam:   General:  Alert in no distress.   Cardiovascular: S 1, S 2 RRR  Respiratory: CTA  Abdomen: bs present, soft, nt  Musculoskeletal: no edema.   Neuro exam: Alert , not oriented, confuse, expressive aphasia. Motor strength 5-5  Data Reviewed: Basic Metabolic Panel:  Recent Labs Lab 04/08/14 2143 04/08/14 2151 04/09/14 1210 04/10/14 0604  NA 130* 134* 140 141  K 3.9 4.0 3.8 4.3  CL 98 100 109 113*  CO2 22  --  25  24  GLUCOSE 663* 665* 181* 196*  BUN 21 27* 18 16  CREATININE 1.65* 1.40* 1.15 1.16  CALCIUM 9.0  --  9.1 8.8   Liver Function Tests:  Recent Labs Lab 04/08/14 2143  AST 23  ALT 19  ALKPHOS 95  BILITOT 0.4  PROT 6.7  ALBUMIN 3.6   No results for input(s): LIPASE, AMYLASE in the last 168 hours. No results for input(s): AMMONIA in the last 168 hours. CBC:  Recent Labs Lab 04/08/14 2143 04/08/14 2151 04/09/14 1210 04/10/14 0604  WBC 9.3  --  9.7 6.7  NEUTROABS 7.4  --   --   --   HGB 13.3 15.0 12.6* 11.6*  HCT 40.7 44.0 36.8* 35.5*  MCV 95.8  --  93.4 94.7  PLT 250  --  235 205   Cardiac Enzymes: No results for input(s): CKTOTAL, CKMB, CKMBINDEX, TROPONINI in the last 168 hours. BNP (last 3 results) No results for input(s): PROBNP in the last 8760 hours. CBG:  Recent Labs Lab 04/10/14 0654 04/10/14 1142 04/10/14 1651 04/10/14 2217 04/11/14 0636  GLUCAP 170* 208* 177* 187* 181*    Recent Results (from the past 240 hour(s))  Culture, Urine     Status: None   Collection Time: 04/09/14  9:42 AM  Result Value Ref Range Status   Specimen Description URINE, CLEAN CATCH  Final   Special Requests  NONE  Final   Colony Count   Final    >=100,000 COLONIES/ML Performed at [facility]    Culture   Final    ENTEROCOCCUS SPECIES Performed at [facility]    Report Status 04/11/2014 FINAL  Final   Organism ID, Bacteria ENTEROCOCCUS SPECIES  Final      Susceptibility   Enterococcus species - MIC*    AMPICILLIN <=2 SENSITIVE Sensitive     LEVOFLOXACIN 1 SENSITIVE Sensitive     NITROFURANTOIN <=16 SENSITIVE Sensitive     VANCOMYCIN 2 SENSITIVE Sensitive     TETRACYCLINE >=16 RESISTANT Resistant     * ENTEROCOCCUS SPECIES     Studies: Ir Angio Intra Extracran Sel Com Carotid Innominate Bilat Mod Sed  04/10/2014   CLINICAL DATA:  New onset aphasia with right-sided weakness. Multiple small ischemic strokes in the watershed area of the left  cerebral hemisphere.  EXAM: BILATERAL COMMON CAROTID AND INNOMINATE ANGIOGRAPHY AND RIGHT VERTEBRAL ARTERY ANGIOGRAMS  PROCEDURE: Contrast: 60 mL OMNIPAQUE IOHEXOL 300 MG/ML  SOLN  Anesthesia/Sedation:  Conscious sedation.  Medications: Versed  1 mg IV.  Following a full explanation of the procedure along with the potential associated complications, an informed witnessed consent was obtained.  The right groin was prepped and draped in the usual sterile fashion. Thereafter using modified Seldinger technique, transfemoral access into the right common femoral artery was obtained without difficulty. Over a 0.035 inch guidewire, a 5 French Pinnacle sheath was inserted. Through this, and also over 0.035 inch guidewire, a 5 French JB1 catheter was advanced to the aortic arch region and selectively positioned in the right common carotid artery, the left common carotid artery and the left vertebral artery.  There were no acute complications. The patient tolerated the procedure well.  FINDINGS: The right common carotid arteriogram demonstrates mild narrowing of the origin of the right external carotid artery. Its branches are normally opacified.  The right internal carotid artery at the bulb has minimal atherosclerotic plaque without significant stenosis by NASCET criteria.  The vessel is otherwise seen to opacify normally to the cranial skull base.  The cervical petrous junction is normal.  The proximal cavernous segment demonstrates a tapered severe stenoses of about 70%. Distal to this and distal to the origin of the ophthalmic artery is another area of approximately 50-60% stenosis.  Distal to this the supraclinoid segment is widely patent.  The right middle and the right anterior cerebral artery opacify normally into the capillary and the venous phases.  Prompt cross-filling via the anterior communicating artery of the left anterior cerebral artery A2 segment is seen with flash filling of the left A1 segment and also  flow into the left middle cerebral artery distribution.  The left common carotid arteriogram demonstrates the left external carotid artery and its major branches to be widely patent.  The left internal carotid artery at the bulb is widely patent.  The delayed arterial images demonstrates antegrade flow albeit slowly into the left cranial skull base and into the cavernous segment of the left internal carotid artery.  Relative stagnation of flow is noted in the region with subsequent clearance.  There is also noted to be opacification of the distal cavernous and the supraclinoid segments of the left internal carotid artery from the ipsilateral ophthalmic artery. The previously noted area of narrowing just distal to the origin of the ophthalmic artery appears narrower compared to the previous arteriogram.  More distally there is flow noted into the left middle cerebral  artery distribution relatively slow compared to the external carotid artery circulation. Opacification of the proximal left anterior cerebral artery is seen also.  The left vertebral artery origin demonstrates approximately 60% narrowing. Tortuosity of the vessel is seen in the mid cervical portion with associated narrowing of approximately 30-40%.  More distally the vessel is seen to opacify to the cranial skull base.  Again demonstrated is the approximately 60-70% stenosis of the left vertebrobasilar junction distal to the origin of the left posterior inferior cerebellar artery.  The basilar artery, the left posterior cerebral artery, superior cerebellar arteries and the anterior inferior cerebellar arteries are seen to opacify into the capillary and the venous phases.  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  Nonvisualization of the right posterior cerebral artery is seen.  IMPRESSION: Interval progression of stenosis of the left  internal carotid artery distal cavernous segment distal to the origin of the ophthalmic artery.  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  Approximately 70% narrowing of the right internal carotid artery proximal cavernous segment, with an approximately 50-60% narrowing of the distal cavernous segment of the right internal carotid artery.  Approximately 60% narrowing of the origin of the dominant left vertebral artery, with nonvisualization of the right posterior cerebral artery from this injection.  Approximately 60- 70% narrowing left vertebral basilar junction.  The angiographic findings were reviewed with the patient, his wife and his daughter.   Electronically Signed   By: Luanne Bras [name]   On: 04/10/2014 10:34   Ir Angio Vertebral Sel Subclavian Innominate Uni L Mod Sed  04/10/2014   CLINICAL DATA:  New onset aphasia with right-sided weakness. Multiple small ischemic strokes in the watershed area of the left cerebral hemisphere.  EXAM: BILATERAL COMMON CAROTID AND INNOMINATE ANGIOGRAPHY AND RIGHT VERTEBRAL ARTERY ANGIOGRAMS  PROCEDURE: Contrast: 60 mL OMNIPAQUE IOHEXOL 300 MG/ML  SOLN  Anesthesia/Sedation:  Conscious sedation.  Medications: Versed  1 mg IV.  Following a full explanation of the procedure along with the potential associated complications, an informed witnessed consent was obtained.  The right groin was prepped and draped in the usual sterile fashion. Thereafter using modified Seldinger technique, transfemoral access into the right common femoral artery was obtained without difficulty. Over a 0.035 inch guidewire, a 5 French Pinnacle sheath was inserted. Through this, and also over 0.035 inch guidewire, a 5 French JB1 catheter was advanced to the aortic arch region and selectively positioned in the right common carotid artery, the left common carotid artery and the left vertebral artery.  There were no acute  complications. The patient tolerated the procedure well.  FINDINGS: The right common carotid arteriogram demonstrates mild narrowing of the origin of the right external carotid artery. Its branches are normally opacified.  The right internal carotid artery at the bulb has minimal atherosclerotic plaque without significant stenosis by NASCET criteria.  The vessel is otherwise seen to opacify normally to the cranial skull base.  The cervical petrous junction is normal.  The proximal cavernous segment demonstrates a tapered severe stenoses of about 70%. Distal to this and distal to the origin of the ophthalmic artery is another area of approximately 50-60% stenosis.  Distal to this the supraclinoid segment is widely patent.  The right middle and the right anterior cerebral artery opacify normally into the capillary and the venous phases.  Prompt cross-filling via the  anterior communicating artery of the left anterior cerebral artery A2 segment is seen with flash filling of the left A1 segment and also flow into the left middle cerebral artery distribution.  The left common carotid arteriogram demonstrates the left external carotid artery and its major branches to be widely patent.  The left internal carotid artery at the bulb is widely patent.  The delayed arterial images demonstrates antegrade flow albeit slowly into the left cranial skull base and into the cavernous segment of the left internal carotid artery.  Relative stagnation of flow is noted in the region with subsequent clearance.  There is also noted to be opacification of the distal cavernous and the supraclinoid segments of the left internal carotid artery from the ipsilateral ophthalmic artery. The previously noted area of narrowing just distal to the origin of the ophthalmic artery appears narrower compared to the previous arteriogram.  More distally there is flow noted into the left middle cerebral artery distribution relatively slow compared to the  external carotid artery circulation. Opacification of the proximal left anterior cerebral artery is seen also.  The left vertebral artery origin demonstrates approximately 60% narrowing. Tortuosity of the vessel is seen in the mid cervical portion with associated narrowing of approximately 30-40%.  More distally the vessel is seen to opacify to the cranial skull base.  Again demonstrated is the approximately 60-70% stenosis of the left vertebrobasilar junction distal to the origin of the left posterior inferior cerebellar artery.  The basilar artery, the left posterior cerebral artery, superior cerebellar arteries and the anterior inferior cerebellar arteries are seen to opacify into the capillary and the venous phases.  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  Nonvisualization of the right posterior cerebral artery is seen.  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  Approximately 70% narrowing of the right internal carotid artery proximal cavernous segment, with an approximately 50-60% narrowing of the distal cavernous segment of the right internal carotid artery.  Approximately 60% narrowing of the origin of the dominant left vertebral artery, with nonvisualization of the right posterior cerebral artery from this injection.  Approximately 60- 70% narrowing left vertebral basilar junction.  The angiographic findings were reviewed with the patient, his wife and his daughter.   Electronically Signed   By: Luanne Bras [name]   On: 04/10/2014 10:34    Scheduled Meds: . allopurinol  300 mg Oral Daily  . aspirin EC  81 mg Oral Daily  . atorvastatin  80 mg Oral q1800   . clopidogrel  75 mg Oral Daily  . enoxaparin (LOVENOX) injection  40 mg Subcutaneous Q24H  . glyBURIDE  5 mg Oral Q breakfast  . insulin aspart  0-15 Units Subcutaneous TID WC  . insulin glargine  10 Units Subcutaneous QHS  . nortriptyline  25 mg Oral QHS  . pantoprazole  40 mg Oral Daily  . sertraline  50 mg Oral QHS   Continuous Infusions: . sodium chloride 100 mL/hr at 04/10/14 1236    Principal Problem:   TIA (transient ischemic attack) Active Problems:   HYPERCHOLESTEROLEMIA   Essential hypertension   Hyperglycemia   Confusion   Diabetes mellitus type II, uncontrolled   Acute, but ill-defined, cerebrovascular disease  Cerebrovascular disease   HLD (hyperlipidemia)   Carotid stenosis    Time spent: 25 minutes.     Niel Hummer A  [facility] Pager [phone]. If 7PM-7AM, please contact night-coverage at [email or web address], password [facility] 04/11/2014, 11:59 AM  LOS: 3 days

## 2014-04-11 NOTE — Progress Notes (Signed)
STROKE TEAM PROGRESS NOTE   HISTORY Peter Nicholson is an 73 y.o. male history of TIA, DM2, HTN, and hyperlipidemia and recent CVA (discharged 02/2014) with acute left MCA infarcts. Presents to the ED 04/08/2014 with complaints of confusion, difficulty speaking and ataxia since Saturday 04/07/2014 at 1200 (LKW).His family is at the bedside and report that he has had difficulty getting words out and slurring of his speech.He was found to have a CT scan of the Brain which was negatie for acute findings (imaging reviewed), and his labs reveal a glucose of 663. His family report that he takes his medications, but he does not check his glucose levels because they found his glucometer and it was collecting dust.  At prior admission in November he had a angiogram done showing "severe preocclusive stenosis of left ICA, 80-85% stenosis of right ICA, 80% stenosis of dominant left vert and occluded right vert. With this stroke he had moderate aphasia. They state current symptoms are similar to prior stroke in November.   Of note, the family feels his symptoms (especially speech) have gotten much worse within the past few minutes. This does appear to coincide with him receiving a dose of hydralazine for a SBP of 168/75.  Patient was not administered TPA secondary to delay in arrival. He was admitted for further evaluation and treatment.   SUBJECTIVE (INTERVAL HISTORY) Wife at bedside. Dr. Erlinda Hong discussed diagnosis, prognosis,  treatment options and plan of care with pt and wife related to planned intervention. Pt and wife would like to proceed with intervention tomorrow.     OBJECTIVE Temp:  [97.5 F (36.4 C)-98.8 F (37.1 C)] 98.7 F (37.1 C) (12/30 0553) Pulse Rate:  [93-105] 103 (12/30 0553) Cardiac Rhythm:  [-]  Resp:  [18-26] 18 (12/30 0553) BP: (137-168)/(61-96) 151/70 mmHg (12/30 0553) SpO2:  [99 %-100 %] 100 % (12/30 0553)   Recent Labs Lab 04/10/14 0654 04/10/14 1142 04/10/14 1651  04/10/14 2217 04/11/14 0636  GLUCAP 170* 208* 177* 187* 181*    Recent Labs Lab 04/08/14 2143 04/08/14 2151 04/09/14 1210 04/10/14 0604  NA 130* 134* 140 141  K 3.9 4.0 3.8 4.3  CL 98 100 109 113*  CO2 22  --  25 24  GLUCOSE 663* 665* 181* 196*  BUN 21 27* 18 16  CREATININE 1.65* 1.40* 1.15 1.16  CALCIUM 9.0  --  9.1 8.8    Recent Labs Lab 04/08/14 2143  AST 23  ALT 19  ALKPHOS 95  BILITOT 0.4  PROT 6.7  ALBUMIN 3.6    Recent Labs Lab 04/08/14 2143 04/08/14 2151 04/09/14 1210 04/10/14 0604  WBC 9.3  --  9.7 6.7  NEUTROABS 7.4  --   --   --   HGB 13.3 15.0 12.6* 11.6*  HCT 40.7 44.0 36.8* 35.5*  MCV 95.8  --  93.4 94.7  PLT 250  --  235 205   No results for input(s): CKTOTAL, CKMB, CKMBINDEX, TROPONINI in the last 168 hours.  Recent Labs  04/08/14 2143  LABPROT 14.0  INR 1.07    Recent Labs  04/08/14 2234  COLORURINE YELLOW  LABSPEC 1.028  PHURINE 5.5  GLUCOSEU >1000*  HGBUR SMALL*  BILIRUBINUR NEGATIVE  KETONESUR NEGATIVE  PROTEINUR 30*  UROBILINOGEN 0.2  NITRITE NEGATIVE  LEUKOCYTESUR MODERATE*       Component Value Date/Time   CHOL 175 04/09/2014 0550   TRIG 105 04/09/2014 0550   HDL 41 04/09/2014 0550   CHOLHDL 4.3 04/09/2014 0550  VLDL 21 04/09/2014 0550   LDLCALC 113* 04/09/2014 0550   Lab Results  Component Value Date   HGBA1C 9.3* 04/09/2014      Component Value Date/Time   LABOPIA NONE DETECTED 04/09/2014 0730   COCAINSCRNUR NONE DETECTED 04/09/2014 0730   LABBENZ NONE DETECTED 04/09/2014 0730   AMPHETMU NONE DETECTED 04/09/2014 0730   THCU NONE DETECTED 04/09/2014 0730   LABBARB NONE DETECTED 04/09/2014 0730    No results for input(s): ETH in the last 168 hours.  Dg Chest 2 View   04/08/2014   IMPRESSION: No active cardiopulmonary disease.      Ct Head (brain) Wo Contrast  04/08/2014  IMPRESSION: Punctate low-density areas throughout the left cerebral hemisphere corresponding to the areas of infarct seen on  previous/recent MRI. No acute infarction.      Mri and Mra Head/brain Wo Cm  04/09/2014   IMPRESSION: MRI HEAD IMPRESSION:  1. Multi focal nonhemorrhagic ischemic infarcts involving the left frontal and parietal lobes as well as the posterior left temporal lobe at the temporoparietal junction. No significant mass effect. 2. Stable appearance of remote bilateral cerebellar infarcts and remote lacunar infarct within the right thalamus. 3. Atrophy with chronic microvascular ischemic disease.  MRA HEAD IMPRESSION:  1. Occluded left internal carotid artery. There is some distal reconstitution at the supraclinoid left ICA, however, this is likely inadequate as flow within the left middle cerebral artery and its branches is markedly attenuated. 2. Focal moderate to severe stenosis within the cavernous right ICA. 3. Occluded right vertebral artery. 4. Multi focal moderate to severe stenosis within the distal left vertebral artery with moderate narrowing of the proximal basilar artery. 5. Poor visualization of the right P1 segment, with nonvisualization of the distal right P2 segment, which may be occluded. 6. Overall, these findings are not significantly changed relative to prior MRA from 02/26/2014.      2D Echocardiogram 02/2014  Left ventricle: The cavity size was normal. There was mild focal basal hypertrophy of the septum. Systolic function was at the lower limits of normal. The estimated ejection fraction was in the range of 50% to 55%.  Carotid Doppler   02/2014 1-39% right internal carotid artery stenosis. The left internal carotid artery waveform is atypical, suggestive of a possible distal obstruction. Vertebral arteries are patent with antegrade flow.   Cerebral Angiogram 04/10/2014 1 Approx 70 % stenosis of prox cavernous RT ICA. 2 Approx 60 % stenosis of dominant LT VA at origin. 3 Interval progression of stenosis of LT ICA distal to origin of ophthalmic artery with severe near complete occlusion  of prox cavernous Lt ICA   PHYSICAL EXAM  General - Well nourished, well developed, in no apparent distress.  Ophthalmologic - not cooperative on exam.  Cardiovascular - Regular rate and rhythm with no murmur.  Mental Status -  Awake alert and orientated to self and place and year. Language was assessed and found to have mild expressive aphasia, much improved than before. able to name 4/5 and still has some difficulty with repetition. Comprehension intact.   Cranial Nerves II - XII - II - Visual field intact OU. III, IV, VI - Extraocular movements intact. V - Facial sensation intact bilaterally. VII - Facial movement intact bilaterally. VIII - Hearing & vestibular intact bilaterally. X - Palate elevates symmetrically. XI - Chin turning & shoulder shrug intact bilaterally. XII - Tongue protrusion intact.  Motor Strength - The patient's strength was normal in all extremities but pronator drift was present  on the right.  Bulk was normal and fasciculations were absent.   Motor Tone - Muscle tone was assessed at the neck and appendages and was normal.  Reflexes - The patient's reflexes were 1+ in all extremities and he had no pathological reflexes.  Sensory - Light touch, temperature/pinprick were assessed and were normal.    Coordination - The patient had normal movements in the hands and feet with no ataxia or dysmetria.  Tremor was absent.  Gait and Station - not tested.   ASSESSMENT/PLAN [name] is a 73 y.o. male with history of TIA, DM2, HTN, and hyperlipidemia and recent CVA (discharged 02/2014) with acute left MCA infarcts. Presents with complaints of confusion, difficulty speaking and ataxia. He did not receive IV t-PA  due to delay in arrival.   Stroke:  Repeat Dominant multiple left frontal, parietal and temporal infarcts, in watershed distribution, felt to be ? thromboembolic secondary to stump emboli from L ICA or from hypoperfusion due to L ICA near  occlusion following similar stroke 1 month ago. Pt had previous left ICA CEA but apparently re-stenosed.  Resultant  Mild expressive aphasia   MRI   Multi focal left frontal, left parietal and left temporal lobe infarcts. Old bilateral cerebellar infarcts and old right thalamus lacune.   MRA  Unchanged since Nov 2015. - Occluded left ICA -cavernous right ICA focal moderate to severe stenosis  -R VA occluded -distal left vertebral artery multi focal moderate to severe stenosis with moderate narrowing of proximal BA  Carotid Doppler  L ICA distal obstruction, right ICA 1-39%  2D Echo  No source of embolus   Angio Nov 2015 severe preocclusive L ICA, R ICA 80-85%, L VBJ 80%, occluded R VBJ at C1  Cerebral angio - R ICA 70%, L VA 60%, L ICA w/ interval progression of stenosis to near occlusion prox ICA distal to origin of opthalmic artery   Neuro intervention with Dr. Estanislado Pandy on [place], Dec 31   Lovenox 40 mg sq daily for VTE prophylaxis  Diet heart healthy/carb modified   aspirin 81 mg orally every day and clopidogrel 75 mg orally every day prior to admission, placed on last admission x 3 months then one alone; now on aspirin 81 mg orally every day and clopidogrel 75 mg orally every day.   Patient counseled to be compliant with his antithrombotic medications  Ongoing aggressive stroke risk factor management  Therapy recommendations:  No PT or OT f/u, OP ST  Disposition:  return home w/ OP ST  Hypertension  Acute neuro worsening after receiving hydralazine in ED for SBP 168  Does not treat unless SBP > 180  Keep BP 130-160  IVF 50cc/hr BP 137-187/66-80 past 24h (04/11/2014 @ 9:49 AM) Avoid hypotension due to critical stenosis of left ICA   Hyperlipidemia  Home meds:  zocor 40,  resumed in hospital  LDL 113, goal < 70  Switched to high dose lipitor 80  Continue lipitor at discharge  Diabetes  HgbA1c 9.3, goal < 7.0  Uncontrolled   On insulin and  gliburide  SSI  DM education.  Manage pre primary team. Need aggressive control  Other Stroke Risk Factors  Advanced age  ETOH use  Hx stroke/TIA  Nov 2015 L MCA/ACA, ACA/PCA watershed infarcts thromboembolic secondary to proximal LICA occlusion with resultantmoderate expressive aphasia. D/c on dual antiplatelets.  Nov 2012 L brain TIA off Plavix in prep for leg surgery  2002 VB TIA, angioplasty of lesion  07/1999 -  BA occlusion, L ICA stenosis, asx, resultant L CEA  Obstructive sleep apnea, doesn't wear CPAP   Other Active Problems  UTI, C&S >100k enterococcus. sensitivities back. Defer treatment to IM  Acute on CKD stage 2  Mild memory problems - evaluated as an OP 06/2012   Other Pertinent History  Kidney cancer  Gout  IBS  Seizures , last seizure Nov 2012  GERD  Hospital day # [place] for Pager information 04/11/2014 10:36 AM   I, the attending vascular neurologist, have personally obtained a history, examined the patient, evaluated laboratory data, individually viewed imaging studies and agree with radiology interpretations. I also reviewed the imaging with Dr. Estanislado Pandy in his office and discussed with Dr. Estanislado Pandy in his office regarding pt care plan. Together with the NP/PA, we formulated the assessment and plan of care which reflects our mutual decision.  I have made any additions or clarifications directly to the above note and agree with the findings and plan as currently documented.   73 yo M with multiple stroke risk factors had previous left CEA as well multiple vessel high grade stenosis. Last month had watershed stroke on the left, this time again more watershed stroke on the left. Also became symptomatic with decreased BP on hydralazine. Angio showed progression of high grade stenosis at distal of cavenous ICA and proximal MCA. Dr. Estanislado Pandy recomended intracranial angioplasty and stent placement.  Viewed image with Dr. Estanislado Pandy today in his office, discussed about left ICA proximal stenosis, distal stenosis and offered pt for intervention tomorrow. I talked with pt and his wife, they would like to proceed.    Rosalin Hawking, MD PhD Stroke Neurology 04/11/2014 1:49 PM      To contact Stroke Continuity provider, please refer to [email or web address] After hours, contact General Neurology

## 2014-04-11 NOTE — Progress Notes (Signed)
Pt and wife stated that he ambulated to the bathroom by him self  to wash up and when he returned to bed noted bleeding to right groin site. This nurse assessed sited noted gauze dressing saturated with blood.  Pressure applied to site, applied dry gauze dressing to stop bleeding. Pt denied pain or discomfort. Pt agreed to call for assistance with ambulation to the bathroom. Will continue to  Monitor.

## 2014-04-11 NOTE — Progress Notes (Signed)
Speech Language Pathology Treatment: Cognitive-Linquistic  Patient Details Name: Peter Nicholson MRN: [id number] DOB: 07-15-1940 Today's Date: 04/11/2014 Time: 8563-1497 SLP Time Calculation (min) (ACUTE ONLY): 20 min  Assessment / Plan / Recommendation Clinical Impression  Pt seen for f/u aphasia treatment with improvements noted since initial evaluation yesterday. Today pt named 10/10 common objects correctly with Mod I and self-correction. Pt completed descriptive naming task with Min cues from SLP for verbal communication at the sentence level. Pt required Max multimodal cueing for divergent naming task, with perseveration most notable during this activity. Continue to recommend OP SLP.   HPI HPI: 73 yo male with hx of htn, dm2, CVA c/o dysarthria beginning about 9:30pm. Pt states that he had difficulty expressing himself. Pt brought to ED and CT scan was negative for any acute process. MRI: Multi focal nonhemorrhagic ischemic infarcts involving the left frontal and pareital lobes as well as the posterior left temporal lobe at the temproparietal junction. Pt was recently admitted in 11/15 with left watershed infarcts of the MCA due to left ICA occlusion, as well as h/o right CVA in 2001.   Pertinent Vitals Pain Assessment: No/denies pain  SLP Plan  Continue with current plan of care    Recommendations      Follow up Recommendations: Outpatient SLP;24 hour supervision/assistance Plan: Continue with current plan of care    GO      Germain Osgood, M.A. CCC-SLP [phone]  Germain Osgood 04/11/2014, 3:08 PM

## 2014-04-12 ENCOUNTER — Encounter (HOSPITAL_COMMUNITY)
Admission: EM | Disposition: A | Discharge status: Home / Self Care | Payer: Self-pay | Source: Home / Self Care | Attending: Internal Medicine

## 2014-04-12 ENCOUNTER — Inpatient Hospital Stay (HOSPITAL_COMMUNITY): Payer: Medicare Other | Admitting: Anesthesiology

## 2014-04-12 ENCOUNTER — Inpatient Hospital Stay (HOSPITAL_COMMUNITY): Payer: Medicare Other

## 2014-04-12 ENCOUNTER — Ambulatory Visit (HOSPITAL_COMMUNITY)
Admission: RE | Admit: 2014-04-12 | Payer: Medicare Other | Source: Ambulatory Visit | Admitting: Interventional Radiology

## 2014-04-12 DIAGNOSIS — I6789 Other cerebrovascular disease: Secondary | ICD-10-CM

## 2014-04-12 DIAGNOSIS — I63032 Cerebral infarction due to thrombosis of left carotid artery: Principal | ICD-10-CM

## 2014-04-12 HISTORY — PX: RADIOLOGY WITH ANESTHESIA: SHX6223

## 2014-04-12 LAB — BASIC METABOLIC PANEL
Anion gap: 5 (ref 5–15)
BUN: 14 mg/dL (ref 6–23)
CHLORIDE: 111 meq/L (ref 96–112)
CO2: 23 mmol/L (ref 19–32)
Calcium: 9.1 mg/dL (ref 8.4–10.5)
Creatinine, Ser: 1.06 mg/dL (ref 0.50–1.35)
GFR calc Af Amer: 78 mL/min — ABNORMAL LOW (ref >90)
GFR, EST NON AFRICAN AMERICAN: 67 mL/min — AB (ref >90)
Glucose, Bld: 146 mg/dL — ABNORMAL HIGH (ref 70–99)
POTASSIUM: 3.8 mmol/L (ref 3.5–5.1)
Sodium: 139 mmol/L (ref 135–145)

## 2014-04-12 LAB — GLUCOSE, CAPILLARY
GLUCOSE-CAPILLARY: 139 mg/dL — AB (ref 70–99)
GLUCOSE-CAPILLARY: 162 mg/dL — AB (ref 70–99)
Glucose-Capillary: 196 mg/dL — ABNORMAL HIGH (ref 70–99)

## 2014-04-12 LAB — POCT ACTIVATED CLOTTING TIME: Activated Clotting Time: 152 seconds

## 2014-04-12 SURGERY — RADIOLOGY WITH ANESTHESIA
Anesthesia: General

## 2014-04-12 MED ORDER — NITROGLYCERIN 1 MG/10 ML FOR IR/CATH LAB
INTRA_ARTERIAL | Status: AC
  Filled 2014-04-12: qty 10

## 2014-04-12 MED ORDER — LACTATED RINGERS IV SOLN
INTRAVENOUS | Status: DC | PRN
  Administered 2014-04-12: 09:00:00 via INTRAVENOUS

## 2014-04-12 MED ORDER — ROCURONIUM BROMIDE 100 MG/10ML IV SOLN
INTRAVENOUS | Status: DC | PRN
  Administered 2014-04-12: 40 mg via INTRAVENOUS

## 2014-04-12 MED ORDER — LABETALOL HCL 5 MG/ML IV SOLN
INTRAVENOUS | Status: DC | PRN
  Administered 2014-04-12: 2.5 mg via INTRAVENOUS
  Administered 2014-04-12: 5 mg via INTRAVENOUS

## 2014-04-12 MED ORDER — GLYCOPYRROLATE 0.2 MG/ML IJ SOLN
INTRAMUSCULAR | Status: DC | PRN
  Administered 2014-04-12: 0.2 mg via INTRAVENOUS
  Administered 2014-04-12: 0.6 mg via INTRAVENOUS

## 2014-04-12 MED ORDER — FENTANYL CITRATE 0.05 MG/ML IJ SOLN
INTRAMUSCULAR | Status: DC | PRN
  Administered 2014-04-12: 100 ug via INTRAVENOUS

## 2014-04-12 MED ORDER — PHENYLEPHRINE HCL 10 MG/ML IJ SOLN
INTRAMUSCULAR | Status: DC | PRN
  Administered 2014-04-12: 80 ug via INTRAVENOUS

## 2014-04-12 MED ORDER — CEFAZOLIN SODIUM-DEXTROSE 2-3 GM-% IV SOLR
INTRAVENOUS | Status: AC
  Administered 2014-04-12: 2 g via INTRAVENOUS
  Filled 2014-04-12: qty 50

## 2014-04-12 MED ORDER — HEPARIN SODIUM (PORCINE) 1000 UNIT/ML IJ SOLN
INTRAMUSCULAR | Status: DC | PRN
  Administered 2014-04-12: 2000 [IU] via INTRAVENOUS

## 2014-04-12 MED ORDER — AMPICILLIN 500 MG PO CAPS: 500.0000 mg | ORAL_CAPSULE | Freq: Three times a day (TID) | ORAL | Status: DC

## 2014-04-12 MED ORDER — EPHEDRINE SULFATE 50 MG/ML IJ SOLN: INTRAMUSCULAR | Status: DC | PRN

## 2014-04-12 MED ORDER — SODIUM CHLORIDE 0.9 % IV SOLN: INTRAVENOUS | Status: DC

## 2014-04-12 MED ORDER — ATORVASTATIN CALCIUM 80 MG PO TABS: 80.0000 mg | ORAL_TABLET | Freq: Every day | ORAL | Status: DC

## 2014-04-12 MED ORDER — SODIUM CHLORIDE 0.9 % IV SOLN
10.0000 mg | INTRAVENOUS | Status: DC | PRN
  Administered 2014-04-12: 25 ug/min via INTRAVENOUS

## 2014-04-12 MED ORDER — NEOSTIGMINE METHYLSULFATE 10 MG/10ML IV SOLN
INTRAVENOUS | Status: DC | PRN
  Administered 2014-04-12: 3 mg via INTRAVENOUS

## 2014-04-12 MED ORDER — LIDOCAINE HCL 1 % IJ SOLN
INTRAMUSCULAR | Status: AC
  Filled 2014-04-12: qty 20

## 2014-04-12 MED ORDER — METFORMIN HCL 500 MG PO TABS: ORAL_TABLET | ORAL | Status: DC

## 2014-04-12 MED ORDER — LACTATED RINGERS IV SOLN
INTRAVENOUS | Status: DC | PRN
  Administered 2014-04-12 (×2): via INTRAVENOUS

## 2014-04-12 MED ORDER — PROPOFOL 10 MG/ML IV BOLUS
INTRAVENOUS | Status: DC | PRN
  Administered 2014-04-12: 30 mg via INTRAVENOUS
  Administered 2014-04-12: 20 mg via INTRAVENOUS
  Administered 2014-04-12: 110 mg via INTRAVENOUS
  Administered 2014-04-12: 30 mg via INTRAVENOUS

## 2014-04-12 MED ORDER — IOHEXOL 300 MG/ML  SOLN
150.0000 mL | Freq: Once | INTRAMUSCULAR | Status: AC | PRN
  Administered 2014-04-12: 80 mL via INTRAVENOUS

## 2014-04-12 MED ORDER — LIDOCAINE HCL (CARDIAC) 20 MG/ML IV SOLN
INTRAVENOUS | Status: DC | PRN
  Administered 2014-04-12: 80 mg via INTRAVENOUS

## 2014-04-12 NOTE — Progress Notes (Signed)
PT Cancellation Note  Patient Details Name: Peter Nicholson MRN: [id number] DOB: May 07, 1940   Cancelled Treatment:    Reason Eval/Treat Not Completed: Medical issues which prohibited therapy. Patient on bedrest x4 hours. Will follow up tomorrow.    Jacqualyn Posey 04/12/2014, 1:10 PM

## 2014-04-12 NOTE — Progress Notes (Signed)
Report received from nurse, Shirlean Mylar

## 2014-04-12 NOTE — Anesthesia Postprocedure Evaluation (Signed)
  Anesthesia Post-op Note  Patient: Peter Nicholson  Procedure(s) Performed: Procedure(s): ANGIOPLASTY (N/A)  Patient Location: PACU  Anesthesia Type:General  Level of Consciousness: awake  Airway and Oxygen Therapy: Patient Spontanous Breathing  Post-op Pain: none  Post-op Assessment: Post-op Vital signs reviewed, Patient's Cardiovascular Status Stable, Respiratory Function Stable, Patent Airway, No signs of Nausea or vomiting and Pain level controlled  Post-op Vital Signs: Reviewed and stable  Last Vitals:  Filed Vitals:   04/12/14 1200  BP:   Pulse: 81  Temp:   Resp: 21    Complications: No apparent anesthesia complications

## 2014-04-12 NOTE — Procedures (Signed)
S/P Lt common carotid arteriogram, RT CFA approach. Findings. Unsucceeful attempt at revascularization of occluded LT ICA cavernous segment. Multiple collaterals noted in the LT ICA cav occluded segment.

## 2014-04-12 NOTE — Sedation Documentation (Signed)
ACT drawn 152 RFA sheath pulled and manual pressure held at site.

## 2014-04-12 NOTE — Progress Notes (Addendum)
STROKE TEAM PROGRESS NOTE   HISTORY Peter Nicholson is an 73 y.o. male history of TIA, DM2, HTN, and hyperlipidemia and recent CVA (discharged 02/2014) with acute left MCA infarcts. Presents to the ED 04/08/2014 with complaints of confusion, difficulty speaking and ataxia since Saturday 04/07/2014 at 1200 (LKW).His family is at the bedside and report that he has had difficulty getting words out and slurring of his speech.He was found to have a CT scan of the Brain which was negatie for acute findings (imaging reviewed), and his labs reveal a glucose of 663. His family report that he takes his medications, but he does not check his glucose levels because they found his glucometer and it was collecting dust.  At prior admission in November he had a angiogram done showing "severe preocclusive stenosis of left ICA, 80-85% stenosis of right ICA, 80% stenosis of dominant left vert and occluded right vert. With this stroke he had moderate aphasia. They state current symptoms are similar to prior stroke in November.   Of note, the family feels his symptoms (especially speech) have gotten much worse within the past few minutes. This does appear to coincide with him receiving a dose of hydralazine for a SBP of 168/75.  Patient was not administered TPA secondary to delay in arrival. He was admitted for further evaluation and treatment.   SUBJECTIVE (INTERVAL HISTORY) Son at bedside. Informed him that Dr. Estanislado Pandy not able to get through the left ICA due to possible occlusion distal to bulb at cavernous segment. Will keep his BP at 130-160 and encourage po fluid and recommend home BP monitoring.    OBJECTIVE Temp:  [97.4 F (36.3 C)-98.4 F (36.9 C)] 97.9 F (36.6 C) (12/31 0550) Pulse Rate:  [78-105] 81 (12/31 1200) Cardiac Rhythm:  [-] Normal sinus rhythm (12/31 1215) Resp:  [13-21] 21 (12/31 1200) BP: (137-167)/(57-80) 156/71 mmHg (12/31 1145) SpO2:  [97 %-100 %] 100 % (12/31 1200) Arterial  Line BP: (159-165)/(67-71) 159/67 mmHg (12/31 1145)   Recent Labs Lab 04/11/14 1125 04/11/14 1650 04/11/14 2143 04/12/14 0631 04/12/14 1121  GLUCAP 165* 123* 200* 139* 162*    Recent Labs Lab 04/08/14 2143 04/08/14 2151 04/09/14 1210 04/10/14 0604 04/12/14 0545  NA 130* 134* 140 141 139  K 3.9 4.0 3.8 4.3 3.8  CL 98 100 109 113* 111  CO2 22  --  25 24 23   GLUCOSE 663* 665* 181* 196* 146*  BUN 21 27* 18 16 14   CREATININE 1.65* 1.40* 1.15 1.16 1.06  CALCIUM 9.0  --  9.1 8.8 9.1    Recent Labs Lab 04/08/14 2143  AST 23  ALT 19  ALKPHOS 95  BILITOT 0.4  PROT 6.7  ALBUMIN 3.6    Recent Labs Lab 04/08/14 2143 04/08/14 2151 04/09/14 1210 04/10/14 0604  WBC 9.3  --  9.7 6.7  NEUTROABS 7.4  --   --   --   HGB 13.3 15.0 12.6* 11.6*  HCT 40.7 44.0 36.8* 35.5*  MCV 95.8  --  93.4 94.7  PLT 250  --  235 205   No results for input(s): CKTOTAL, CKMB, CKMBINDEX, TROPONINI in the last 168 hours. No results for input(s): LABPROT, INR in the last 72 hours. No results for input(s): COLORURINE, LABSPEC, [place], GLUCOSEU, HGBUR, BILIRUBINUR, KETONESUR, PROTEINUR, UROBILINOGEN, NITRITE, LEUKOCYTESUR in the last 72 hours.  Invalid input(s): APPERANCEUR     Component Value Date/Time   CHOL 175 04/09/2014 0550   TRIG 105 04/09/2014 0550   HDL 41  04/09/2014 0550   CHOLHDL 4.3 04/09/2014 0550   VLDL 21 04/09/2014 0550   LDLCALC 113* 04/09/2014 0550   Lab Results  Component Value Date   HGBA1C 9.3* 04/09/2014      Component Value Date/Time   LABOPIA NONE DETECTED 04/09/2014 0730   COCAINSCRNUR NONE DETECTED 04/09/2014 0730   LABBENZ NONE DETECTED 04/09/2014 0730   AMPHETMU NONE DETECTED 04/09/2014 0730   THCU NONE DETECTED 04/09/2014 0730   LABBARB NONE DETECTED 04/09/2014 0730    No results for input(s): ETH in the last 168 hours.  Dg Chest 2 View   04/08/2014   IMPRESSION: No active cardiopulmonary disease.      Ct Head (brain) Wo Contrast  04/08/2014   IMPRESSION: Punctate low-density areas throughout the left cerebral hemisphere corresponding to the areas of infarct seen on previous/recent MRI. No acute infarction.      Mri and Mra Head/brain Wo Cm  04/09/2014   IMPRESSION: MRI HEAD IMPRESSION:  1. Multi focal nonhemorrhagic ischemic infarcts involving the left frontal and parietal lobes as well as the posterior left temporal lobe at the temporoparietal junction. No significant mass effect. 2. Stable appearance of remote bilateral cerebellar infarcts and remote lacunar infarct within the right thalamus. 3. Atrophy with chronic microvascular ischemic disease.  MRA HEAD IMPRESSION:  1. Occluded left internal carotid artery. There is some distal reconstitution at the supraclinoid left ICA, however, this is likely inadequate as flow within the left middle cerebral artery and its branches is markedly attenuated. 2. Focal moderate to severe stenosis within the cavernous right ICA. 3. Occluded right vertebral artery. 4. Multi focal moderate to severe stenosis within the distal left vertebral artery with moderate narrowing of the proximal basilar artery. 5. Poor visualization of the right P1 segment, with nonvisualization of the distal right P2 segment, which may be occluded. 6. Overall, these findings are not significantly changed relative to prior MRA from 02/26/2014.      2D Echocardiogram 02/2014  Left ventricle: The cavity size was normal. There was mild focal basal hypertrophy of the septum. Systolic function was at the lower limits of normal. The estimated ejection fraction was in the range of 50% to 55%.  Carotid Doppler   02/2014 1-39% right internal carotid artery stenosis. The left internal carotid artery waveform is atypical, suggestive of a possible distal obstruction. Vertebral arteries are patent with antegrade flow.   Cerebral Angiogram 04/10/2014 1 Approx 70 % stenosis of prox cavernous RT ICA. 2 Approx 60 % stenosis of dominant LT VA at  origin. 3 Interval progression of stenosis of LT ICA distal to origin of ophthalmic artery with severe near complete occlusion of prox cavernous Lt ICA   PHYSICAL EXAM  General - Well nourished, well developed, in no apparent distress.  Ophthalmologic - not cooperative on exam.  Cardiovascular - Regular rate and rhythm with no murmur.  Mental Status -  Awake alert and orientated to self and place and year. Language was assessed and found to have mild expressive aphasia, much improved than before. able to name 4/5 and improved on repetition. Comprehension intact.   Cranial Nerves II - XII - II - Visual field intact OU. III, IV, VI - Extraocular movements intact. V - Facial sensation intact bilaterally. VII - Facial movement intact bilaterally. VIII - Hearing & vestibular intact bilaterally. X - Palate elevates symmetrically. XI - Chin turning & shoulder shrug intact bilaterally. XII - Tongue protrusion intact.  Motor Strength - The patient's strength was normal in  all extremities. Bulk was normal and fasciculations were absent.   Motor Tone - Muscle tone was assessed at the neck and appendages and was normal.  Reflexes - The patient's reflexes were 1+ in all extremities and he had no pathological reflexes.  Sensory - Light touch, temperature/pinprick were assessed and were normal.    Coordination - The patient had normal movements in the hands and feet with no ataxia or dysmetria.  Tremor was absent.  Gait and Station - not tested.   ASSESSMENT/PLAN Peter Nicholson is a 73 y.o. male with history of TIA, DM2, HTN, and hyperlipidemia and recent CVA (discharged 02/2014) with acute left MCA infarcts. Presents with complaints of confusion, difficulty speaking and ataxia. He did not receive IV t-PA  due to delay in arrival.   Stroke:  Repeat Dominant multiple left frontal, parietal and temporal infarcts, in watershed distribution, felt to be ? thromboembolic secondary to stump  emboli from L ICA or from hypoperfusion due to L ICA near occlusion following similar stroke 1 month ago. Pt had previous left ICA CEA but apparently re-stenosed.  Resultant  Mild expressive aphasia   MRI   Multi focal left frontal, left parietal and left temporal lobe infarcts. Old bilateral cerebellar infarcts and old right thalamus lacune.   MRA  Unchanged since Nov 2015. - Occluded left ICA -cavernous right ICA focal moderate to severe stenosis  -R VA occluded -distal left vertebral artery multi focal moderate to severe stenosis with moderate narrowing of proximal BA  Carotid Doppler  L ICA distal obstruction, right ICA 1-39%  2D Echo  No source of embolus   Angio Nov 2015 severe preocclusive L ICA, R ICA 80-85%, L VBJ 80%, occluded R VBJ at C1  Cerebral angio - R ICA 70%, L VA 60%, L ICA w/ interval progression of stenosis to near occlusion prox ICA distal to origin of opthalmic artery   Attempt left ICA angioplasty but failed due to occlusion of left ICA at cavernous segment.   Lovenox 40 mg sq daily for VTE prophylaxis  Diet - low sodium heart healthy   aspirin 81 mg orally every day and clopidogrel 75 mg orally every day prior to admission, placed on last admission x 3 months then one alone; now on aspirin 81 mg orally every day and clopidogrel 75 mg orally every day.   Patient counseled to be compliant with his antithrombotic medications  Ongoing aggressive stroke risk factor management  Therapy recommendations:  No PT or OT f/u, OP ST  Disposition:  return home w/ OP ST  Hypertension  Acute neuro worsening after receiving hydralazine in ED for SBP 168  Does not treat unless SBP > 180  Keep BP 130-160  Encourage po fluid and keep po hydration BP 137-187/66-80 past 24h (04/12/2014 @ 1:29 PM) Avoid hypotension due to occlusion of left ICA Pt need BP monitoring device at home and checking BP twice a day and follow up with PCP   Hyperlipidemia  Home meds:  zocor  40,  resumed in hospital  LDL 113, goal < 70  Switched to high dose lipitor 80  Continue lipitor at discharge  Diabetes  HgbA1c 9.3, goal < 7.0  Uncontrolled   On insulin and gliburide  SSI  DM education.  Manage pre primary team. Need aggressive control  Other Stroke Risk Factors  Advanced age  ETOH use  Hx stroke/TIA  Nov 2015 L MCA/ACA, ACA/PCA watershed infarcts thromboembolic secondary to proximal LICA occlusion with resultantmoderate  expressive aphasia. D/c on dual antiplatelets.  Nov 2012 L brain TIA off Plavix in prep for leg surgery  2002 VB TIA, angioplasty of lesion  07/1999 - BA occlusion, L ICA stenosis, asx, resultant L CEA  Obstructive sleep apnea, doesn't wear CPAP   Other Active Problems  UTI, C&S >100k enterococcus. sensitivities back. Defer treatment to IM  Acute on CKD stage 2  Mild memory problems - evaluated as an OP 06/2012   Other Pertinent History  Kidney cancer  Gout  IBS  Seizures , last seizure Nov 2012  [facility] day # 4   Neurology will sign off. Please call with questions. Pt will follow up with Dr. Leonie Man at [facility] in about 2 months. Thanks for the consult.  Rosalin Hawking, MD PhD Stroke Neurology 04/12/2014 1:29 PM    To contact Stroke Continuity provider, please refer to [email or web address] After hours, contact General Neurology

## 2014-04-12 NOTE — Anesthesia Preprocedure Evaluation (Addendum)
Anesthesia Evaluation  Patient identified by MRN, date of birth, ID band Patient awake and Patient confused    Reviewed: Allergy & Precautions, H&P , NPO status , Patient's Chart, lab work & pertinent test results  History of Anesthesia Complications Negative for: history of anesthetic complications  Airway Mallampati: III  TM Distance: >3 FB Neck ROM: full  Mouth opening: Limited Mouth Opening  Dental  (+) Teeth Intact, Dental Advidsory Given   Pulmonary sleep apnea ,  breath sounds clear to auscultation        Cardiovascular hypertension, Pt. on medications + Peripheral Vascular Disease Rhythm:Regular     Neuro/Psych Seizures -,  PSYCHIATRIC DISORDERS TIA Neuromuscular disease CVA    GI/Hepatic Neg liver ROS, GERD-  Medicated and Controlled,  Endo/Other  diabetes, Type 2, Insulin Dependent, Oral Hypoglycemic Agents  Renal/GU Renal InsufficiencyRenal disease     Musculoskeletal   Abdominal   Peds  Hematology  (+) anemia ,   Anesthesia Other Findings Poor historian.  Spoke with family and son regarding history. Study Conclusions  - Left ventricle: The cavity size was normal. There was mild focal basal hypertrophy of the septum. Systolic function was at the lower limits of normal. The estimated ejection fraction was in the range of 50% to 55%. Although no diagnostic regional wall motion abnormality was identified, this possibility cannot be completely excluded on the basis of this study. Doppler parameters are consistent with abnormal left ventricular relaxation (grade 1 diastolic dysfunction). - Aortic valve: There was mild regurgitation. - Mitral valve: Calcified annulus.   Reproductive/Obstetrics                           Anesthesia Physical Anesthesia Plan  ASA: III  Anesthesia Plan: General   Post-op Pain Management:    Induction: Intravenous  Airway Management  Planned: Oral ETT  Additional Equipment: Arterial line  Intra-op Plan:   Post-operative Plan: Extubation in OR and Possible Post-op intubation/ventilation  Informed Consent: I have reviewed the patients History and Physical, chart, labs and discussed the procedure including the risks, benefits and alternatives for the proposed anesthesia with the patient or authorized representative who has indicated his/her understanding and acceptance.   Dental Advisory Given  Plan Discussed with: Anesthesiologist, Surgeon and CRNA  Anesthesia Plan Comments:        Anesthesia Quick Evaluation

## 2014-04-12 NOTE — Sedation Documentation (Signed)
Puncture time RFA.

## 2014-04-12 NOTE — Progress Notes (Signed)
Pts wife called stated to this nurse that she didn't feel comfortable taking him home tonight because of yesterday's bleeding episode. She stated that he just returned from a procedure today and thought that he needed to stay another night for monitoring. Md notified, new order to discontinue discharge today.  Site to right groin assessed noted clean, dry and intact. Pulses present. Will continue to monitor.

## 2014-04-12 NOTE — Progress Notes (Signed)
Pt left unit for procedure this morning alert, verbal with no noted distress. Stable. Neuro intact. Family at bedside. He denied pain or discomfort.

## 2014-04-12 NOTE — Discharge Summary (Signed)
Physician Discharge Summary  Peter Nicholson SHF:[id number] DOB: Aug 10, 1940 DOA: 04/08/2014  PCP: Peter Space, MD  Admit date: 04/08/2014 Discharge date: 04/12/2014  Time spent: 35 minutes  Recommendations for Outpatient Follow-up:  Assess BP. BP goal per neurology recommendation 130 to 160. Follow up with neurology in 4 weeks.   Discharge Diagnoses:    Stroke   HYPERCHOLESTEROLEMIA   Essential hypertension   Hyperglycemia   Confusion   Diabetes mellitus type II, uncontrolled   Acute, but ill-defined, cerebrovascular disease   Cerebrovascular disease   HLD (hyperlipidemia)   Carotid stenosis     Discharge Condition: Stable.   Diet recommendation: Carb modified, Heart healthy  Filed Weights   04/08/14 2051 04/09/14 0258  Weight: 83.915 kg (185 lb) 81.874 kg (180 lb 8 oz)    History of present illness:  Peter Nicholson is a 73 y.o. male with a history of TIA, DM2, HTN, and hyperlipidemia and remote Hx of CVA, and RCCa S/P Nephrectomy in 1997 who presents to the ED with complaints of confusion, difficulty speaking and ataxia since Saturday. His family is at the bedside and report that he has had anomia and slurring of his speech. He was found to have a CT scan of the Brain which was negatie for acute findings, and his labs reveal a glucose of 666. His family report that he takes his medications, but he does not check his glucose levels because they found his glucometer and it was collecting dust. He was administered IV Novolog x 1 in the ED and his glucose level decreased to 377, and his labs do not reveal DKA at this time.   Hospital Course:  1-Multiple acute nonhemorrhagic infarct left cerebral hemisphere:  Had Carotid US and 2D ECHO on 02/26/2014. S/P Angiography 12-29 LDL 113, on statins.  Unsuccessful attempt revascularization LT ICA cavernous segment.   Per neurology ok to discharge home later today.  Risk factors modification.  Per neurology BP range  130 to 160.   Encephalopathy: in the event of acute stroke. Resolved.   Diabetes; Continue with lantus and SSI. Resume home medications.   Acute on chronic renal diseases; stage 2  Renal function stable.   Hypertension: hold lisinopril at discharge. Inpatient BP in the 150. BP goal 130 to 160.  PRN hydralazine for SBP more than 180.   Hyperlipidemia: On statin.  LDL 113.   UTI; follow urine culture growing enterococcus. Follow sensitivity. Will start ampicillin. Treat for 3 days.   S/P nephrectomy.  Procedures:  angio  Consultations:  Neurology  Discharge Exam: Filed Vitals:   04/12/14 1200  BP:   Pulse: 81  Temp:   Resp: 21    General: Alert in no distress.  Cardiovascular: S 1, S 2 RRR Respiratory: CTA Neuro; alert expressive aphasia.   Discharge Instructions   Discharge Instructions    Ambulatory referral to Nutrition and Diabetic Education    Complete by:  As directed   HgbA1C of 11.6%. Home regimen-Lantus 65 units daily and novolog meal coverage tidwc PCP-Peter Nicholson. Pt interested in OP ed.     Diet - low sodium heart healthy    Complete by:  As directed      Increase activity slowly    Complete by:  As directed           Current Discharge Medication List    START taking these medications   Details  ampicillin (PRINCIPEN) 500 MG capsule Take 1 capsule (500 mg total) by mouth every  8 (eight) hours. Qty: 6 capsule, Refills: 0    atorvastatin (LIPITOR) 80 MG tablet Take 1 tablet (80 mg total) by mouth daily at 6 PM. Qty: 30 tablet, Refills: 0      CONTINUE these medications which have NOT CHANGED   Details  allopurinol (ZYLOPRIM) 300 MG tablet TAKE 1 TABLET (300 MG TOTAL) BY MOUTH DAILY. Qty: 90 tablet, Refills: 1    ALPRAZolam (XANAX) 0.5 MG tablet TAKE ONE-HALF TO ONE TABLET BY MOUTH THREE TIMES DAILY AS NEEDED FOR NERVES Qty: 90 tablet, Refills: 5    aspirin EC 81 MG EC tablet Take 1 tablet (81 mg total) by mouth daily. Qty: 30  tablet, Refills: 0    Blood Glucose Monitoring Suppl (ONE TOUCH ULTRA SYSTEM KIT) W/DEVICE KIT Test blood sugar up to 3 times daily Qty: 1 each, Refills: 0    clopidogrel (PLAVIX) 75 MG tablet Take 1 tablet (75 mg total) by mouth daily. Qty: 90 tablet, Refills: 0    glucose blood test strip Test blood sugar up to 3 times daily Qty: 100 each, Refills: 6    glyBURIDE (DIABETA) 5 MG tablet Take 5 mg by mouth daily with breakfast.     Insulin Glargine (LANTUS SOLOSTAR) 100 UNIT/ML Solostar Pen Inject 10 Units into the skin at bedtime. Qty: 3 mL, Refills: 5    Insulin Pen Needle 31G X 8 MM MISC Use with Lantus Solostar Pen once daily as directed Qty: 100 each, Refills: 5    metFORMIN (GLUCOPHAGE) 500 MG tablet Take 2 tablets by mouth two times daily Qty: 360 tablet, Refills: 3    nortriptyline (PAMELOR) 25 MG capsule Take 25 mg by mouth at bedtime.    pantoprazole (PROTONIX) 40 MG tablet TAKE 1 TABLET (40 MG TOTAL) BY MOUTH DAILY AT 12 NOON. Qty: 90 tablet, Refills: 3    saxagliptin HCl (ONGLYZA) 5 MG TABS tablet Take 1 tablet (5 mg total) by mouth daily. Qty: 90 tablet, Refills: 3    sertraline (ZOLOFT) 50 MG tablet Take 1 tablet (50 mg total) by mouth at bedtime. Qty: 90 tablet, Refills: 2    traMADol (ULTRAM) 50 MG tablet Take 50 mg by mouth every 6 (six) hours as needed for pain.       STOP taking these medications     lisinopril (PRINIVIL,ZESTRIL) 5 MG tablet      simvastatin (ZOCOR) 40 MG tablet        Allergies  Allergen Reactions  . Dilaudid [Hydromorphone Hcl] Other (See Comments)    hallucinations  . Fentanyl Other (See Comments)    hallucinations   Follow-up Information    Follow up with Xu,Jindong, MD In 4 weeks.   Specialty:  Neurology   Contact information:   [address] [place] [address] [phone]       Follow up with Peter M, MD In 2 weeks.   Specialty:  Pulmonary Disease   Contact information:   [place] [address] [phone]        The results of significant diagnostics from this hospitalization (including imaging, microbiology, ancillary and laboratory) are listed below for reference.    Significant Diagnostic Studies: Dg Chest 2 View  04/08/2014   CLINICAL DATA:  Altered mental status. Stroke 3 weeks ago. Speech problems and difficulty walking since yesterday.  EXAM: CHEST  2 VIEW  COMPARISON:  03/10/2011  FINDINGS: Heart and mediastinal contours are within normal limits. No focal opacities or effusions. No acute  bony abnormality.  IMPRESSION: No active cardiopulmonary disease.   Electronically Signed   By: Rolm Baptise Nicholson.D.   On: 04/08/2014 22:29   Ct Head (brain) Wo Contrast  04/08/2014   CLINICAL DATA:  Speech and difficulty walking since yesterday. History of stroke 3 weeks ago.  EXAM: CT HEAD WITHOUT CONTRAST  TECHNIQUE: Contiguous axial images were obtained from the base of the skull through the vertex without intravenous contrast.  COMPARISON:  02/26/2014  FINDINGS: Punctate low-density areas noted throughout the left cerebral hemisphere, likely related to the previously seen left cerebral infarcts by previous MRI.  Old infarcts in the right cerebellum. No acute infarct. No hemorrhage or hydrocephalus. No mass lesion or midline shift.  No acute calvarial abnormality. Visualized paranasal sinuses and mastoids clear. Orbital soft tissues unremarkable.  IMPRESSION: Punctate low-density areas throughout the left cerebral hemisphere corresponding to the areas of infarct seen on previous/recent MRI. No acute infarction.   Electronically Signed   By: Rolm Baptise Nicholson.D.   On: 04/08/2014 22:18   Mri Brain Without Contrast  04/09/2014   CLINICAL DATA:  Initial evaluation for 2-3 days of speech difficulty and gait instability. Recent stroke.  EXAM: MRI HEAD WITHOUT CONTRAST  MRA HEAD WITHOUT CONTRAST  TECHNIQUE: Multiplanar, multiecho pulse sequences of the brain and surrounding  structures were obtained without intravenous contrast. Angiographic images of the head were obtained using MRA technique without contrast.  COMPARISON:  Prior CT from 04/08/2014 as well as previous MRI from 02/26/2014  FINDINGS: MRI HEAD FINDINGS  Multiple acute nonhemorrhagic ischemic infarcts seen scattered throughout the left cerebral hemisphere, involving the left frontal and parietal lobes, as well as the left temporal lobe at the temporoparietal junction. And left temporal lobes. These involve both the cortical gray matter and deep white matter of the left centrum semi ovale. No associated hemorrhage or significant mass effect. No intracranial hemorrhage.  Remote bilateral cerebellar infarcts again noted, right greater than left. Remote infarct within the right thalamus also again noted. Chronic small vessel ischemic changes again noted, stable.  No mass lesion or midline shift.  No hydrocephalus.  Craniocervical junction within normal limits. Degenerative changes noted within the upper cervical spine. Pituitary gland normal. No acute abnormality seen about the orbits.  Paranasal sinuses clear.  No mastoid effusion.  MRA HEAD FINDINGS  ANTERIOR CIRCULATION:  Again, the left internal carotid artery is occluded. There is some reconstitution at the left supra clinoid ICA, likely via collateral flow cross the circle of Willis. Flow within the left middle cerebral artery and distal MCA branches is markedly attenuated and diminutive as compared to the contralateral right. This finding is similar relative to prior study.  Visualized distal cervical segment of the internal carotid artery widely patent with antegrade flow. Petrous segment widely patent. There is short-segment moderate to severe stenosis of the cavernous segment of the right ICA, also stable. Supra clinoid right ICA widely patent.  Right A1 segment is widely patent. The left A1 segment is poorly opacified, and may be hypoplastic or containing high-grade  stenosis. Anterior communicating artery grossly normal. The A2 segments are widely patent bilaterally.  POSTERIOR CIRCULATION:  The right vertebral artery is occluded. Right posterior inferior cerebral artery not well evaluated, likely occluded as well. Multi focal stenoses, 1 of which is high-grade present within the distal left vertebral artery. B basilar artery is moderately marrow proximally.  Left P1 and P2 segments are widely patent. Right P1 segment not well visualized. The distal right PCA is  not visualized as well, and may be occluded.  No aneurysm or vascular malformation.  IMPRESSION: MRI HEAD IMPRESSION:  1. Multi focal nonhemorrhagic ischemic infarcts involving the left frontal and parietal lobes as well as the posterior left temporal lobe at the temporoparietal junction. No significant mass effect. 2. Stable appearance of remote bilateral cerebellar infarcts and remote lacunar infarct within the right thalamus. 3. Atrophy with chronic microvascular ischemic disease.  MRA HEAD IMPRESSION:  1. Occluded left internal carotid artery. There is some distal reconstitution at the supra clinoid left ICA, however, this is likely inadequate as flow within the left middle cerebral artery and its branches is markedly attenuated. 2. Focal moderate to severe stenosis within the cavernous right ICA. 3. Occluded right vertebral artery. 4. Multi focal moderate to severe stenosis within the distal left vertebral artery with moderate narrowing of the proximal basilar artery. 5. Poor visualization of the right P1 segment, with nonvisualization of the distal right P2 segment, which may be occluded. 6. Overall, these findings are not significantly changed relative to prior MRA from 02/26/2014.   Electronically Signed   By: Jeannine Boga Nicholson.D.   On: 04/09/2014 06:41   Mr Jodene Nam Head/brain Wo Cm  04/09/2014   CLINICAL DATA:  Initial evaluation for 2-3 days of speech difficulty and gait instability. Recent stroke.  EXAM:  MRI HEAD WITHOUT CONTRAST  MRA HEAD WITHOUT CONTRAST  TECHNIQUE: Multiplanar, multiecho pulse sequences of the brain and surrounding structures were obtained without intravenous contrast. Angiographic images of the head were obtained using MRA technique without contrast.  COMPARISON:  Prior CT from 04/08/2014 as well as previous MRI from 02/26/2014  FINDINGS: MRI HEAD FINDINGS  Multiple acute nonhemorrhagic ischemic infarcts seen scattered throughout the left cerebral hemisphere, involving the left frontal and parietal lobes, as well as the left temporal lobe at the temporoparietal junction. And left temporal lobes. These involve both the cortical gray matter and deep white matter of the left centrum semi ovale. No associated hemorrhage or significant mass effect. No intracranial hemorrhage.  Remote bilateral cerebellar infarcts again noted, right greater than left. Remote infarct within the right thalamus also again noted. Chronic small vessel ischemic changes again noted, stable.  No mass lesion or midline shift.  No hydrocephalus.  Craniocervical junction within normal limits. Degenerative changes noted within the upper cervical spine. Pituitary gland normal. No acute abnormality seen about the orbits.  Paranasal sinuses clear.  No mastoid effusion.  MRA HEAD FINDINGS  ANTERIOR CIRCULATION:  Again, the left internal carotid artery is occluded. There is some reconstitution at the left supra clinoid ICA, likely via collateral flow cross the circle of Willis. Flow within the left middle cerebral artery and distal MCA branches is markedly attenuated and diminutive as compared to the contralateral right. This finding is similar relative to prior study.  Visualized distal cervical segment of the internal carotid artery widely patent with antegrade flow. Petrous segment widely patent. There is short-segment moderate to severe stenosis of the cavernous segment of the right ICA, also stable. Supra clinoid right ICA widely  patent.  Right A1 segment is widely patent. The left A1 segment is poorly opacified, and may be hypoplastic or containing high-grade stenosis. Anterior communicating artery grossly normal. The A2 segments are widely patent bilaterally.  POSTERIOR CIRCULATION:  The right vertebral artery is occluded. Right posterior inferior cerebral artery not well evaluated, likely occluded as well. Multi focal stenoses, 1 of which is high-grade present within the distal left vertebral artery. B basilar  artery is moderately marrow proximally.  Left P1 and P2 segments are widely patent. Right P1 segment not well visualized. The distal right PCA is not visualized as well, and may be occluded.  No aneurysm or vascular malformation.  IMPRESSION: MRI HEAD IMPRESSION:  1. Multi focal nonhemorrhagic ischemic infarcts involving the left frontal and parietal lobes as well as the posterior left temporal lobe at the temporoparietal junction. No significant mass effect. 2. Stable appearance of remote bilateral cerebellar infarcts and remote lacunar infarct within the right thalamus. 3. Atrophy with chronic microvascular ischemic disease.  MRA HEAD IMPRESSION:  1. Occluded left internal carotid artery. There is some distal reconstitution at the supra clinoid left ICA, however, this is likely inadequate as flow within the left middle cerebral artery and its branches is markedly attenuated. 2. Focal moderate to severe stenosis within the cavernous right ICA. 3. Occluded right vertebral artery. 4. Multi focal moderate to severe stenosis within the distal left vertebral artery with moderate narrowing of the proximal basilar artery. 5. Poor visualization of the right P1 segment, with nonvisualization of the distal right P2 segment, which may be occluded. 6. Overall, these findings are not significantly changed relative to prior MRA from 02/26/2014.   Electronically Signed   By: Jeannine Boga Nicholson.D.   On: 04/09/2014 06:41   Ir Angio Intra  Extracran Sel Com Carotid Innominate Bilat Mod Sed  04/10/2014   CLINICAL DATA:  New onset aphasia with right-sided weakness. Multiple small ischemic strokes in the watershed area of the left cerebral hemisphere.  EXAM: BILATERAL COMMON CAROTID AND INNOMINATE ANGIOGRAPHY AND RIGHT VERTEBRAL ARTERY ANGIOGRAMS  PROCEDURE: Contrast: 60 mL OMNIPAQUE IOHEXOL 300 MG/ML  SOLN  Anesthesia/Sedation:  Conscious sedation.  Medications: Versed  1 mg IV.  Following a full explanation of the procedure along with the potential associated complications, an informed witnessed consent was obtained.  The right groin was prepped and draped in the usual sterile fashion. Thereafter using modified Seldinger technique, transfemoral access into the right common femoral artery was obtained without difficulty. Over a 0.035 inch guidewire, a 5 French Pinnacle sheath was inserted. Through this, and also over 0.035 inch guidewire, a 5 French JB1 catheter was advanced to the aortic arch region and selectively positioned in the right common carotid artery, the left common carotid artery and the left vertebral artery.  There were no acute complications. The patient tolerated the procedure well.  FINDINGS: The right common carotid arteriogram demonstrates mild narrowing of the origin of the right external carotid artery. Its branches are normally opacified.  The right internal carotid artery at the bulb has minimal atherosclerotic plaque without significant stenosis by NASCET criteria.  The vessel is otherwise seen to opacify normally to the cranial skull base.  The cervical petrous junction is normal.  The proximal cavernous segment demonstrates a tapered severe stenoses of about 70%. Distal to this and distal to the origin of the ophthalmic artery is another area of approximately 50-60% stenosis.  Distal to this the supraclinoid segment is widely patent.  The right middle and the right anterior cerebral artery opacify normally into the capillary  and the venous phases.  Prompt cross-filling via the anterior communicating artery of the left anterior cerebral artery A2 segment is seen with flash filling of the left A1 segment and also flow into the left middle cerebral artery distribution.  The left common carotid arteriogram demonstrates the left external carotid artery and its major branches to be widely patent.  The left internal  carotid artery at the bulb is widely patent.  The delayed arterial images demonstrates antegrade flow albeit slowly into the left cranial skull base and into the cavernous segment of the left internal carotid artery.  Relative stagnation of flow is noted in the region with subsequent clearance.  There is also noted to be opacification of the distal cavernous and the supraclinoid segments of the left internal carotid artery from the ipsilateral ophthalmic artery. The previously noted area of narrowing just distal to the origin of the ophthalmic artery appears narrower compared to the previous arteriogram.  More distally there is flow noted into the left middle cerebral artery distribution relatively slow compared to the external carotid artery circulation. Opacification of the proximal left anterior cerebral artery is seen also.  The left vertebral artery origin demonstrates approximately 60% narrowing. Tortuosity of the vessel is seen in the mid cervical portion with associated narrowing of approximately 30-40%.  More distally the vessel is seen to opacify to the cranial skull base.  Again demonstrated is the approximately 60-70% stenosis of the left vertebrobasilar junction distal to the origin of the left posterior inferior cerebellar artery.  The basilar artery, the left posterior cerebral artery, superior cerebellar arteries and the anterior inferior cerebellar arteries are seen to opacify into the capillary and the venous phases.  The delayed arterial phase demonstrates the leptomeningeal collateralization of the left parietal  cortical and subcortical branches from the left PCA P3 branches.  Nonvisualization of the right posterior cerebral artery is seen.  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  Approximately 70% narrowing of the right internal carotid artery proximal cavernous segment, with an approximately 50-60% narrowing of the distal cavernous segment of the right internal carotid artery.  Approximately 60% narrowing of the origin of the dominant left vertebral artery, with nonvisualization of the right posterior cerebral artery from this injection.  Approximately 60- 70% narrowing left vertebral basilar junction.  The angiographic findings were reviewed with the patient, his wife and his daughter.   Electronically Signed   By: Luanne Bras Nicholson.D.   On: 04/10/2014 10:34   Ir Angio Vertebral Sel Subclavian Innominate Uni L Mod Sed  04/10/2014   CLINICAL DATA:  New onset aphasia with right-sided weakness. Multiple small ischemic strokes in the watershed area of the left cerebral hemisphere.  EXAM: BILATERAL COMMON CAROTID AND INNOMINATE ANGIOGRAPHY AND RIGHT VERTEBRAL ARTERY ANGIOGRAMS  PROCEDURE: Contrast: 60 mL OMNIPAQUE IOHEXOL 300 MG/ML  SOLN  Anesthesia/Sedation:  Conscious sedation.  Medications: Versed  1 mg IV.  Following a full explanation of the procedure along with the potential associated complications, an informed witnessed consent was obtained.  The right groin was prepped and draped in the usual sterile fashion. Thereafter using modified Seldinger technique, transfemoral access into the right common femoral artery was obtained without difficulty. Over a 0.035 inch guidewire, a 5 French Pinnacle sheath was inserted. Through this, and also over 0.035 inch guidewire, a 5 French JB1 catheter  was advanced to the aortic arch region and selectively positioned in the right common carotid artery, the left common carotid artery and the left vertebral artery.  There were no acute complications. The patient tolerated the procedure well.  FINDINGS: The right common carotid arteriogram demonstrates mild narrowing of the origin of the right external carotid artery. Its branches  are normally opacified.  The right internal carotid artery at the bulb has minimal atherosclerotic plaque without significant stenosis by NASCET criteria.  The vessel is otherwise seen to opacify normally to the cranial skull base.  The cervical petrous junction is normal.  The proximal cavernous segment demonstrates a tapered severe stenoses of about 70%. Distal to this and distal to the origin of the ophthalmic artery is another area of approximately 50-60% stenosis.  Distal to this the supraclinoid segment is widely patent.  The right middle and the right anterior cerebral artery opacify normally into the capillary and the venous phases.  Prompt cross-filling via the anterior communicating artery of the left anterior cerebral artery A2 segment is seen with flash filling of the left A1 segment and also flow into the left middle cerebral artery distribution.  The left common carotid arteriogram demonstrates the left external carotid artery and its major branches to be widely patent.  The left internal carotid artery at the bulb is widely patent.  The delayed arterial images demonstrates antegrade flow albeit slowly into the left cranial skull base and into the cavernous segment of the left internal carotid artery.  Relative stagnation of flow is noted in the region with subsequent clearance.  There is also noted to be opacification of the distal cavernous and the supraclinoid segments of the left internal carotid artery from the ipsilateral ophthalmic artery. The previously noted area of narrowing just distal to the origin of the  ophthalmic artery appears narrower compared to the previous arteriogram.  More distally there is flow noted into the left middle cerebral artery distribution relatively slow compared to the external carotid artery circulation. Opacification of the proximal left anterior cerebral artery is seen also.  The left vertebral artery origin demonstrates approximately 60% narrowing. Tortuosity of the vessel is seen in the mid cervical portion with associated narrowing of approximately 30-40%.  More distally the vessel is seen to opacify to the cranial skull base.  Again demonstrated is the approximately 60-70% stenosis of the left vertebrobasilar junction distal to the origin of the left posterior inferior cerebellar artery.  The basilar artery, the left posterior cerebral artery, superior cerebellar arteries and the anterior inferior cerebellar arteries are seen to opacify into the capillary and the venous phases.  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  Nonvisualization of the right posterior cerebral artery is seen.  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  Approximately 70% narrowing of the right internal carotid artery proximal cavernous segment, with an approximately 50-60% narrowing of the distal cavernous segment of the right internal carotid artery.  Approximately 60% narrowing of the origin of the dominant left vertebral artery, with nonvisualization of the right posterior cerebral artery from this injection.  Approximately 60- 70% narrowing left vertebral basilar junction.  The angiographic findings were reviewed with the patient, his wife and his daughter.    Electronically Signed   By: Luanne Bras Nicholson.D.   On: 04/10/2014 10:34    Microbiology: Recent Results (from the past 240 hour(s))  Culture, Urine     Status: None   Collection Time: 04/09/14  9:42 AM  Result Value Ref Range Status   Specimen Description URINE, CLEAN CATCH  Final  Special Requests NONE  Final   Colony Count   Final    >=100,000 COLONIES/ML Performed at [facility]    Culture   Final    ENTEROCOCCUS SPECIES Performed at [facility]    Report Status 04/11/2014 FINAL  Final   Organism ID, Bacteria ENTEROCOCCUS SPECIES  Final      Susceptibility   Enterococcus species - MIC*    AMPICILLIN <=2 SENSITIVE Sensitive     LEVOFLOXACIN 1 SENSITIVE Sensitive     NITROFURANTOIN <=16 SENSITIVE Sensitive     VANCOMYCIN 2 SENSITIVE Sensitive     TETRACYCLINE >=16 RESISTANT Resistant     * ENTEROCOCCUS SPECIES     Labs: Basic Metabolic Panel:  Recent Labs Lab 04/08/14 2143 04/08/14 2151 04/09/14 1210 04/10/14 0604 04/12/14 0545  NA 130* 134* 140 141 139  K 3.9 4.0 3.8 4.3 3.8  CL 98 100 109 113* 111  CO2 22  --  _0 GLUCOSE 663* 665* 181* 196* 146*  BUN 21 27* _1 CREATININE 1.65* 1.40* 1.15 1.16 1.06  CALCIUM 9.0  --  9.1 8.8 9.1   Liver Function Tests:  Recent Labs Lab 04/08/14 2143  AST 23  ALT 19  ALKPHOS 95  BILITOT 0.4  PROT 6.7  ALBUMIN 3.6   No results for input(s): LIPASE, AMYLASE in the last 168 hours. No results for input(s): AMMONIA in the last 168 hours. CBC:  Recent Labs Lab 04/08/14 2143 04/08/14 2151 04/09/14 1210 04/10/14 0604  WBC 9.3  --  9.7 6.7  NEUTROABS 7.4  --   --   --   HGB 13.3 15.0 12.6* 11.6*  HCT 40.7 44.0 36.8* 35.5*  MCV 95.8  --  93.4 94.7  PLT 250  --  235 205   Cardiac Enzymes: No results for input(s): CKTOTAL, CKMB, CKMBINDEX, TROPONINI in the last 168 hours. BNP: BNP (last 3 results) No results for input(s): PROBNP in the last 8760 hours. CBG:  Recent Labs Lab  04/11/14 1125 04/11/14 1650 04/11/14 2143 04/12/14 0631 04/12/14 1121  GLUCAP 165* 123* 200* 139* 162*       Signed:  Anitta Tenny A  [facility] 04/12/2014, 1:05 PM

## 2014-04-12 NOTE — Sedation Documentation (Signed)
Anesthesia present to provide anesthesia, airway management and vitals monitoring and support.

## 2014-04-12 NOTE — Progress Notes (Signed)
Pt received at 12:20pm with no noted distress. He denies pain or discomfort. Returned on bedrest, lying flat. Stable, neuro intact.  Family at bedside. Site to right groin assessed clean gauze dressing, dry and intact. Will continue to monitor.

## 2014-04-12 NOTE — Anesthesia Procedure Notes (Signed)
Procedure Name: Intubation Date/Time: 04/12/2014 9:18 AM Performed by: Neldon Newport Pre-anesthesia Checklist: Patient identified, Emergency Drugs available, Suction available, Patient being monitored and Timeout performed Patient Re-evaluated:Patient Re-evaluated prior to inductionOxygen Delivery Method: Circle system utilized Preoxygenation: Pre-oxygenation with 100% oxygen Intubation Type: IV induction Ventilation: Mask ventilation without difficulty Laryngoscope Size: Mac and 2 (McGrath) Grade View: Grade I Tube type: Oral Number of attempts: 1 Placement Confirmation: ETT inserted through vocal cords under direct vision,  breath sounds checked- equal and bilateral and positive ETCO2 Secured at: 23 cm Tube secured with: Tape Dental Injury: Teeth and Oropharynx as per pre-operative assessment

## 2014-04-12 NOTE — Transfer of Care (Signed)
Immediate Anesthesia Transfer of Care Note  Patient: Peter Nicholson  Procedure(s) Performed: Procedure(s): ANGIOPLASTY (N/A)  Patient Location: PACU  Anesthesia Type:General  Level of Consciousness: awake, alert  and oriented  Airway & Oxygen Therapy: Patient Spontanous Breathing and Patient connected to nasal cannula oxygen  Post-op Assessment: Report given to PACU RN, Post -op Vital signs reviewed and stable and Patient moving all extremities X 4  Post vital signs: Reviewed and stable  Complications: No apparent anesthesia complications

## 2014-04-13 LAB — GLUCOSE, CAPILLARY
GLUCOSE-CAPILLARY: 172 mg/dL — AB (ref 70–99)
GLUCOSE-CAPILLARY: 170 mg/dL — AB (ref 70–99)
Glucose-Capillary: 161 mg/dL — ABNORMAL HIGH (ref 70–99)
Glucose-Capillary: 119 mg/dL — ABNORMAL HIGH (ref 70–99)

## 2014-04-13 NOTE — Discharge Summary (Signed)
Physician Discharge Summary  Peter Nicholson MVE:[id number] DOB: October 15, 1940 DOA: 04/08/2014  PCP: Noralee Space, MD  Admit date: 04/08/2014 Discharge date: 04/13/2014  Time spent: 35 minutes  Recommendations for Outpatient Follow-up:  Assess BP. BP goal per neurology recommendation 130 to 160. Follow up with neurology in 4 weeks.   Discharge Diagnoses:    Stroke   HYPERCHOLESTEROLEMIA   Essential hypertension   Hyperglycemia   Confusion   Diabetes mellitus type II, uncontrolled   Acute, but ill-defined, cerebrovascular disease   Cerebrovascular disease   HLD (hyperlipidemia)   Carotid stenosis     Discharge Condition: Stable.   Diet recommendation: Carb modified, Heart healthy  Filed Weights   04/08/14 2051 04/09/14 0258  Weight: 83.915 kg (185 lb) 81.874 kg (180 lb 8 oz)    History of present illness:  Peter Nicholson is a 74 y.o. male with a history of TIA, DM2, HTN, and hyperlipidemia and remote Hx of CVA, and RCCa S/P Nephrectomy in 1997 who presents to the ED with complaints of confusion, difficulty speaking and ataxia since Saturday. His family is at the bedside and report that he has had anomia and slurring of his speech. He was found to have a CT scan of the Brain which was negatie for acute findings, and his labs reveal a glucose of 666. His family report that he takes his medications, but he does not check his glucose levels because they found his glucometer and it was collecting dust. He was administered IV Novolog x 1 in the ED and his glucose level decreased to 377, and his labs do not reveal DKA at this time.   Hospital Course:  1-Multiple acute nonhemorrhagic infarct left cerebral hemisphere:  Had Carotid US and 2D ECHO on 02/26/2014. S/P Angiography 12-29 LDL 113, on statins.  Unsuccessful attempt revascularization LT ICA cavernous segment.   Per neurology ok to discharge home later today.  Risk factors modification.  Per neurology BP range 130  to 160.   Encephalopathy: in the event of acute stroke. Resolved.   Diabetes; Continue with lantus and SSI. Resume home medications.   Acute on chronic renal diseases; stage 2  Renal function stable.   Hypertension: hold lisinopril at discharge. Inpatient BP in the 150. BP goal 130 to 160.  PRN hydralazine for SBP more than 180.   Hyperlipidemia: On statin.  LDL 113.   UTI; follow urine culture growing enterococcus. Follow sensitivity. Will start ampicillin. Treat for 3 days.   S/P nephrectomy.  Procedures:  angio  Consultations:  Neurology  Discharge Exam: Filed Vitals:   04/13/14 0536  BP: 137/58  Pulse:   Temp: 98 F (36.7 C)  Resp: 18    General: Alert in no distress.  Cardiovascular: S 1, S 2 RRR Respiratory: CTA Neuro; alert expressive aphasia.   Discharge Instructions   Discharge Instructions    Ambulatory referral to Neurology    Complete by:  As directed   Pt will follow up with Dr. Leonie Man at [facility] in about 2 months. Pt is Dr. Clydene Fake pt in the past. Thanks.     Ambulatory referral to Nutrition and Diabetic Education    Complete by:  As directed   HgbA1C of 11.6%. Home regimen-Lantus 65 units daily and novolog meal coverage tidwc PCP-Scott Nadel. Pt interested in OP ed.     Diet - low sodium heart healthy    Complete by:  As directed      Increase activity slowly    Complete  by:  As directed           Current Discharge Medication List    START taking these medications   Details  ampicillin (PRINCIPEN) 500 MG capsule Take 1 capsule (500 mg total) by mouth every 8 (eight) hours. Qty: 6 capsule, Refills: 0    atorvastatin (LIPITOR) 80 MG tablet Take 1 tablet (80 mg total) by mouth daily at 6 PM. Qty: 30 tablet, Refills: 0      CONTINUE these medications which have CHANGED   Details  metFORMIN (GLUCOPHAGE) 500 MG tablet Take 2 tablets by mouth two times daily Qty: 360 tablet, Refills: 3      CONTINUE these medications which have NOT  CHANGED   Details  allopurinol (ZYLOPRIM) 300 MG tablet TAKE 1 TABLET (300 MG TOTAL) BY MOUTH DAILY. Qty: 90 tablet, Refills: 1    ALPRAZolam (XANAX) 0.5 MG tablet TAKE ONE-HALF TO ONE TABLET BY MOUTH THREE TIMES DAILY AS NEEDED FOR NERVES Qty: 90 tablet, Refills: 5    aspirin EC 81 MG EC tablet Take 1 tablet (81 mg total) by mouth daily. Qty: 30 tablet, Refills: 0    Blood Glucose Monitoring Suppl (ONE TOUCH ULTRA SYSTEM KIT) W/DEVICE KIT Test blood sugar up to 3 times daily Qty: 1 each, Refills: 0    clopidogrel (PLAVIX) 75 MG tablet Take 1 tablet (75 mg total) by mouth daily. Qty: 90 tablet, Refills: 0    glucose blood test strip Test blood sugar up to 3 times daily Qty: 100 each, Refills: 6    glyBURIDE (DIABETA) 5 MG tablet Take 5 mg by mouth daily with breakfast.     Insulin Glargine (LANTUS SOLOSTAR) 100 UNIT/ML Solostar Pen Inject 10 Units into the skin at bedtime. Qty: 3 mL, Refills: 5    Insulin Pen Needle 31G X 8 MM MISC Use with Lantus Solostar Pen once daily as directed Qty: 100 each, Refills: 5    nortriptyline (PAMELOR) 25 MG capsule Take 25 mg by mouth at bedtime.    pantoprazole (PROTONIX) 40 MG tablet TAKE 1 TABLET (40 MG TOTAL) BY MOUTH DAILY AT 12 NOON. Qty: 90 tablet, Refills: 3    saxagliptin HCl (ONGLYZA) 5 MG TABS tablet Take 1 tablet (5 mg total) by mouth daily. Qty: 90 tablet, Refills: 3    sertraline (ZOLOFT) 50 MG tablet Take 1 tablet (50 mg total) by mouth at bedtime. Qty: 90 tablet, Refills: 2    traMADol (ULTRAM) 50 MG tablet Take 50 mg by mouth every 6 (six) hours as needed for pain.       STOP taking these medications     lisinopril (PRINIVIL,ZESTRIL) 5 MG tablet      simvastatin (ZOCOR) 40 MG tablet        Allergies  Allergen Reactions  . Dilaudid [Hydromorphone Hcl] Other (See Comments)    hallucinations  . Fentanyl Other (See Comments)    hallucinations   Follow-up Information    Follow up with NADEL,SCOTT M, MD In 1 week.    Specialty:  Pulmonary Disease   Contact information:   [place] [address] [phone]       Follow up with SETHI,PRAMOD, MD In 2 months.   Specialties:  Neurology, Radiology   Contact information:   [address] [place] [address] [phone]        The results of significant diagnostics from this hospitalization (including imaging, microbiology, ancillary and laboratory) are listed below for reference.  Significant Diagnostic Studies: Dg Chest 2 View  04/08/2014   CLINICAL DATA:  Altered mental status. Stroke 3 weeks ago. Speech problems and difficulty walking since yesterday.  EXAM: CHEST  2 VIEW  COMPARISON:  03/10/2011  FINDINGS: Heart and mediastinal contours are within normal limits. No focal opacities or effusions. No acute bony abnormality.  IMPRESSION: No active cardiopulmonary disease.   Electronically Signed   By: Rolm Baptise [name]   On: 04/08/2014 22:29   Ct Head (brain) Wo Contrast  04/08/2014   CLINICAL DATA:  Speech and difficulty walking since yesterday. History of stroke 3 weeks ago.  EXAM: CT HEAD WITHOUT CONTRAST  TECHNIQUE: Contiguous axial images were obtained from the base of the skull through the vertex without intravenous contrast.  COMPARISON:  02/26/2014  FINDINGS: Punctate low-density areas noted throughout the left cerebral hemisphere, likely related to the previously seen left cerebral infarcts by previous MRI.  Old infarcts in the right cerebellum. No acute infarct. No hemorrhage or hydrocephalus. No mass lesion or midline shift.  No acute calvarial abnormality. Visualized paranasal sinuses and mastoids clear. Orbital soft tissues unremarkable.  IMPRESSION: Punctate low-density areas throughout the left cerebral hemisphere corresponding to the areas of infarct seen on previous/recent MRI. No acute infarction.   Electronically Signed   By: Rolm Baptise [name]   On: 04/08/2014 22:18   Mri Brain Without  Contrast  04/09/2014   CLINICAL DATA:  Initial evaluation for 2-3 days of speech difficulty and gait instability. Recent stroke.  EXAM: MRI HEAD WITHOUT CONTRAST  MRA HEAD WITHOUT CONTRAST  TECHNIQUE: Multiplanar, multiecho pulse sequences of the brain and surrounding structures were obtained without intravenous contrast. Angiographic images of the head were obtained using MRA technique without contrast.  COMPARISON:  Prior CT from 04/08/2014 as well as previous MRI from 02/26/2014  FINDINGS: MRI HEAD FINDINGS  Multiple acute nonhemorrhagic ischemic infarcts seen scattered throughout the left cerebral hemisphere, involving the left frontal and parietal lobes, as well as the left temporal lobe at the temporoparietal junction. And left temporal lobes. These involve both the cortical gray matter and deep white matter of the left centrum semi ovale. No associated hemorrhage or significant mass effect. No intracranial hemorrhage.  Remote bilateral cerebellar infarcts again noted, right greater than left. Remote infarct within the right thalamus also again noted. Chronic small vessel ischemic changes again noted, stable.  No mass lesion or midline shift.  No hydrocephalus.  Craniocervical junction within normal limits. Degenerative changes noted within the upper cervical spine. Pituitary gland normal. No acute abnormality seen about the orbits.  Paranasal sinuses clear.  No mastoid effusion.  MRA HEAD FINDINGS  ANTERIOR CIRCULATION:  Again, the left internal carotid artery is occluded. There is some reconstitution at the left supra clinoid ICA, likely via collateral flow cross the circle of Willis. Flow within the left middle cerebral artery and distal MCA branches is markedly attenuated and diminutive as compared to the contralateral right. This finding is similar relative to prior study.  Visualized distal cervical segment of the internal carotid artery widely patent with antegrade flow. Petrous segment widely patent.  There is short-segment moderate to severe stenosis of the cavernous segment of the right ICA, also stable. Supra clinoid right ICA widely patent.  Right A1 segment is widely patent. The left A1 segment is poorly opacified, and may be hypoplastic or containing high-grade stenosis. Anterior communicating artery grossly normal. The A2 segments are widely patent bilaterally.  POSTERIOR CIRCULATION:  The right vertebral artery is  occluded. Right posterior inferior cerebral artery not well evaluated, likely occluded as well. Multi focal stenoses, 1 of which is high-grade present within the distal left vertebral artery. B basilar artery is moderately marrow proximally.  Left P1 and P2 segments are widely patent. Right P1 segment not well visualized. The distal right PCA is not visualized as well, and may be occluded.  No aneurysm or vascular malformation.  IMPRESSION: MRI HEAD IMPRESSION:  1. Multi focal nonhemorrhagic ischemic infarcts involving the left frontal and parietal lobes as well as the posterior left temporal lobe at the temporoparietal junction. No significant mass effect. 2. Stable appearance of remote bilateral cerebellar infarcts and remote lacunar infarct within the right thalamus. 3. Atrophy with chronic microvascular ischemic disease.  MRA HEAD IMPRESSION:  1. Occluded left internal carotid artery. There is some distal reconstitution at the supra clinoid left ICA, however, this is likely inadequate as flow within the left middle cerebral artery and its branches is markedly attenuated. 2. Focal moderate to severe stenosis within the cavernous right ICA. 3. Occluded right vertebral artery. 4. Multi focal moderate to severe stenosis within the distal left vertebral artery with moderate narrowing of the proximal basilar artery. 5. Poor visualization of the right P1 segment, with nonvisualization of the distal right P2 segment, which may be occluded. 6. Overall, these findings are not significantly changed  relative to prior MRA from 02/26/2014.   Electronically Signed   By: Jeannine Boga [name]   On: 04/09/2014 06:41   Mr Jodene Nam Head/brain Wo Cm  04/09/2014   CLINICAL DATA:  Initial evaluation for 2-3 days of speech difficulty and gait instability. Recent stroke.  EXAM: MRI HEAD WITHOUT CONTRAST  MRA HEAD WITHOUT CONTRAST  TECHNIQUE: Multiplanar, multiecho pulse sequences of the brain and surrounding structures were obtained without intravenous contrast. Angiographic images of the head were obtained using MRA technique without contrast.  COMPARISON:  Prior CT from 04/08/2014 as well as previous MRI from 02/26/2014  FINDINGS: MRI HEAD FINDINGS  Multiple acute nonhemorrhagic ischemic infarcts seen scattered throughout the left cerebral hemisphere, involving the left frontal and parietal lobes, as well as the left temporal lobe at the temporoparietal junction. And left temporal lobes. These involve both the cortical gray matter and deep white matter of the left centrum semi ovale. No associated hemorrhage or significant mass effect. No intracranial hemorrhage.  Remote bilateral cerebellar infarcts again noted, right greater than left. Remote infarct within the right thalamus also again noted. Chronic small vessel ischemic changes again noted, stable.  No mass lesion or midline shift.  No hydrocephalus.  Craniocervical junction within normal limits. Degenerative changes noted within the upper cervical spine. Pituitary gland normal. No acute abnormality seen about the orbits.  Paranasal sinuses clear.  No mastoid effusion.  MRA HEAD FINDINGS  ANTERIOR CIRCULATION:  Again, the left internal carotid artery is occluded. There is some reconstitution at the left supra clinoid ICA, likely via collateral flow cross the circle of Willis. Flow within the left middle cerebral artery and distal MCA branches is markedly attenuated and diminutive as compared to the contralateral right. This finding is similar relative to prior  study.  Visualized distal cervical segment of the internal carotid artery widely patent with antegrade flow. Petrous segment widely patent. There is short-segment moderate to severe stenosis of the cavernous segment of the right ICA, also stable. Supra clinoid right ICA widely patent.  Right A1 segment is widely patent. The left A1 segment is poorly opacified, and may be hypoplastic  or containing high-grade stenosis. Anterior communicating artery grossly normal. The A2 segments are widely patent bilaterally.  POSTERIOR CIRCULATION:  The right vertebral artery is occluded. Right posterior inferior cerebral artery not well evaluated, likely occluded as well. Multi focal stenoses, 1 of which is high-grade present within the distal left vertebral artery. B basilar artery is moderately marrow proximally.  Left P1 and P2 segments are widely patent. Right P1 segment not well visualized. The distal right PCA is not visualized as well, and may be occluded.  No aneurysm or vascular malformation.  IMPRESSION: MRI HEAD IMPRESSION:  1. Multi focal nonhemorrhagic ischemic infarcts involving the left frontal and parietal lobes as well as the posterior left temporal lobe at the temporoparietal junction. No significant mass effect. 2. Stable appearance of remote bilateral cerebellar infarcts and remote lacunar infarct within the right thalamus. 3. Atrophy with chronic microvascular ischemic disease.  MRA HEAD IMPRESSION:  1. Occluded left internal carotid artery. There is some distal reconstitution at the supra clinoid left ICA, however, this is likely inadequate as flow within the left middle cerebral artery and its branches is markedly attenuated. 2. Focal moderate to severe stenosis within the cavernous right ICA. 3. Occluded right vertebral artery. 4. Multi focal moderate to severe stenosis within the distal left vertebral artery with moderate narrowing of the proximal basilar artery. 5. Poor visualization of the right P1  segment, with nonvisualization of the distal right P2 segment, which may be occluded. 6. Overall, these findings are not significantly changed relative to prior MRA from 02/26/2014.   Electronically Signed   By: Jeannine Boga [name]   On: 04/09/2014 06:41   Ir Angio Intra Extracran Sel Com Carotid Innominate Bilat Mod Sed  04/10/2014   CLINICAL DATA:  New onset aphasia with right-sided weakness. Multiple small ischemic strokes in the watershed area of the left cerebral hemisphere.  EXAM: BILATERAL COMMON CAROTID AND INNOMINATE ANGIOGRAPHY AND RIGHT VERTEBRAL ARTERY ANGIOGRAMS  PROCEDURE: Contrast: 60 mL OMNIPAQUE IOHEXOL 300 MG/ML  SOLN  Anesthesia/Sedation:  Conscious sedation.  Medications: Versed  1 mg IV.  Following a full explanation of the procedure along with the potential associated complications, an informed witnessed consent was obtained.  The right groin was prepped and draped in the usual sterile fashion. Thereafter using modified Seldinger technique, transfemoral access into the right common femoral artery was obtained without difficulty. Over a 0.035 inch guidewire, a 5 French Pinnacle sheath was inserted. Through this, and also over 0.035 inch guidewire, a 5 French JB1 catheter was advanced to the aortic arch region and selectively positioned in the right common carotid artery, the left common carotid artery and the left vertebral artery.  There were no acute complications. The patient tolerated the procedure well.  FINDINGS: The right common carotid arteriogram demonstrates mild narrowing of the origin of the right external carotid artery. Its branches are normally opacified.  The right internal carotid artery at the bulb has minimal atherosclerotic plaque without significant stenosis by NASCET criteria.  The vessel is otherwise seen to opacify normally to the cranial skull base.  The cervical petrous junction is normal.  The proximal cavernous segment demonstrates a tapered severe stenoses  of about 70%. Distal to this and distal to the origin of the ophthalmic artery is another area of approximately 50-60% stenosis.  Distal to this the supraclinoid segment is widely patent.  The right middle and the right anterior cerebral artery opacify normally into the capillary and the venous phases.  Prompt cross-filling via the  anterior communicating artery of the left anterior cerebral artery A2 segment is seen with flash filling of the left A1 segment and also flow into the left middle cerebral artery distribution.  The left common carotid arteriogram demonstrates the left external carotid artery and its major branches to be widely patent.  The left internal carotid artery at the bulb is widely patent.  The delayed arterial images demonstrates antegrade flow albeit slowly into the left cranial skull base and into the cavernous segment of the left internal carotid artery.  Relative stagnation of flow is noted in the region with subsequent clearance.  There is also noted to be opacification of the distal cavernous and the supraclinoid segments of the left internal carotid artery from the ipsilateral ophthalmic artery. The previously noted area of narrowing just distal to the origin of the ophthalmic artery appears narrower compared to the previous arteriogram.  More distally there is flow noted into the left middle cerebral artery distribution relatively slow compared to the external carotid artery circulation. Opacification of the proximal left anterior cerebral artery is seen also.  The left vertebral artery origin demonstrates approximately 60% narrowing. Tortuosity of the vessel is seen in the mid cervical portion with associated narrowing of approximately 30-40%.  More distally the vessel is seen to opacify to the cranial skull base.  Again demonstrated is the approximately 60-70% stenosis of the left vertebrobasilar junction distal to the origin of the left posterior inferior cerebellar artery.  The  basilar artery, the left posterior cerebral artery, superior cerebellar arteries and the anterior inferior cerebellar arteries are seen to opacify into the capillary and the venous phases.  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  Nonvisualization of the right posterior cerebral artery is seen.  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  Approximately 70% narrowing of the right internal carotid artery proximal cavernous segment, with an approximately 50-60% narrowing of the distal cavernous segment of the right internal carotid artery.  Approximately 60% narrowing of the origin of the dominant left vertebral artery, with nonvisualization of the right posterior cerebral artery from this injection.  Approximately 60- 70% narrowing left vertebral basilar junction.  The angiographic findings were reviewed with the patient, his wife and his daughter.   Electronically Signed   By: Luanne Bras [name]   On: 04/10/2014 10:34   Ir Angio Vertebral Sel Subclavian Innominate Uni L Mod Sed  04/10/2014   CLINICAL DATA:  New onset aphasia with right-sided weakness. Multiple small ischemic strokes in the watershed area of the left cerebral hemisphere.  EXAM: BILATERAL COMMON CAROTID AND INNOMINATE ANGIOGRAPHY AND RIGHT VERTEBRAL ARTERY ANGIOGRAMS  PROCEDURE: Contrast: 60 mL OMNIPAQUE IOHEXOL 300 MG/ML  SOLN  Anesthesia/Sedation:  Conscious sedation.  Medications: Versed  1 mg IV.  Following a full explanation of the procedure along with the potential associated complications, an informed witnessed consent was obtained.  The right groin was prepped and draped in the usual sterile  fashion. Thereafter using modified Seldinger technique, transfemoral access into the right common femoral artery was obtained without difficulty. Over a 0.035 inch guidewire, a 5 French Pinnacle sheath was inserted. Through this, and also over 0.035 inch guidewire, a 5 French JB1 catheter was advanced to the  aortic arch region and selectively positioned in the right common carotid artery, the left common carotid artery and the left vertebral artery.  There were no acute complications. The patient tolerated the procedure well.  FINDINGS: The right common carotid arteriogram demonstrates mild narrowing of the origin of the right external carotid artery. Its branches are normally opacified.  The right internal carotid artery at the bulb has minimal atherosclerotic plaque without significant stenosis by NASCET criteria.  The vessel is otherwise seen to opacify normally to the cranial skull base.  The cervical petrous junction is normal.  The proximal cavernous segment demonstrates a tapered severe stenoses of about 70%. Distal to this and distal to the origin of the ophthalmic artery is another area of approximately 50-60% stenosis.  Distal to this the supraclinoid segment is widely patent.  The right middle and the right anterior cerebral artery opacify normally into the capillary and the venous phases.  Prompt cross-filling via the anterior communicating artery of the left anterior cerebral artery A2 segment is seen with flash filling of the left A1 segment and also flow into the left middle cerebral artery distribution.  The left common carotid arteriogram demonstrates the left external carotid artery and its major branches to be widely patent.  The left internal carotid artery at the bulb is widely patent.  The delayed arterial images demonstrates antegrade flow albeit slowly into the left cranial skull base and into the cavernous segment of the left internal carotid artery.  Relative stagnation of flow is noted in  the region with subsequent clearance.  There is also noted to be opacification of the distal cavernous and the supraclinoid segments of the left internal carotid artery from the ipsilateral ophthalmic artery. The previously noted area of narrowing just distal to the origin of the ophthalmic artery appears narrower compared to the previous arteriogram.  More distally there is flow noted into the left middle cerebral artery distribution relatively slow compared to the external carotid artery circulation. Opacification of the proximal left anterior cerebral artery is seen also.  The left vertebral artery origin demonstrates approximately 60% narrowing. Tortuosity of the vessel is seen in the mid cervical portion with associated narrowing of approximately 30-40%.  More distally the vessel is seen to opacify to the cranial skull base.  Again demonstrated is the approximately 60-70% stenosis of the left vertebrobasilar junction distal to the origin of the left posterior inferior cerebellar artery.  The basilar artery, the left posterior cerebral artery, superior cerebellar arteries and the anterior inferior cerebellar arteries are seen to opacify into the capillary and the venous phases.  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  Nonvisualization of the right posterior cerebral artery is seen.  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  Approximately 70% narrowing of the right internal carotid artery proximal cavernous segment, with an approximately 50-60% narrowing of the distal cavernous segment of the right internal carotid artery.  Approximately 60% narrowing of the origin of  the dominant left vertebral artery, with nonvisualization of the right posterior cerebral artery from this injection.  Approximately 60- 70% narrowing left vertebral basilar junction.  The angiographic findings were reviewed with the patient, his wife and his daughter.   Electronically Signed  By: Luanne Bras [name]   On: 04/10/2014 10:34    Microbiology: Recent Results (from the past 240 hour(s))  Culture, Urine     Status: None   Collection Time: 04/09/14  9:42 AM  Result Value Ref Range Status   Specimen Description URINE, CLEAN CATCH  Final   Special Requests NONE  Final   Colony Count   Final    >=100,000 COLONIES/ML Performed at [facility]    Culture   Final    ENTEROCOCCUS SPECIES Performed at [facility]    Report Status 04/11/2014 FINAL  Final   Organism ID, Bacteria ENTEROCOCCUS SPECIES  Final      Susceptibility   Enterococcus species - MIC*    AMPICILLIN <=2 SENSITIVE Sensitive     LEVOFLOXACIN 1 SENSITIVE Sensitive     NITROFURANTOIN <=16 SENSITIVE Sensitive     VANCOMYCIN 2 SENSITIVE Sensitive     TETRACYCLINE >=16 RESISTANT Resistant     * ENTEROCOCCUS SPECIES     Labs: Basic Metabolic Panel:  Recent Labs Lab 04/08/14 2143 04/08/14 2151 04/09/14 1210 04/10/14 0604 04/12/14 0545  NA 130* 134* 140 141 139  K 3.9 4.0 3.8 4.3 3.8  CL 98 100 109 113* 111  CO2 22  --  '25 24 23  ' GLUCOSE 663* 665* 181* 196* 146*  BUN 21 27* '18 16 14  ' CREATININE 1.65* 1.40* 1.15 1.16 1.06  CALCIUM 9.0  --  9.1 8.8 9.1   Liver Function Tests:  Recent Labs Lab 04/08/14 2143  AST 23  ALT 19  ALKPHOS 95  BILITOT 0.4  PROT 6.7  ALBUMIN 3.6   No results for input(s): LIPASE, AMYLASE in the last 168 hours. No results for input(s): AMMONIA in the last 168 hours. CBC:  Recent Labs Lab 04/08/14 2143 04/08/14 2151 04/09/14 1210 04/10/14 0604  WBC 9.3  --  9.7 6.7  NEUTROABS 7.4  --   --   --   HGB 13.3 15.0 12.6* 11.6*  HCT 40.7 44.0 36.8*  35.5*  MCV 95.8  --  93.4 94.7  PLT 250  --  235 205   Cardiac Enzymes: No results for input(s): CKTOTAL, CKMB, CKMBINDEX, TROPONINI in the last 168 hours. BNP: BNP (last 3 results) No results for input(s): PROBNP in the last 8760 hours. CBG:  Recent Labs Lab 04/12/14 1121 04/12/14 1432 04/12/14 1646 04/12/14 2107 04/13/14 0635  GLUCAP 162* 161* 172* 196* 119*       Signed:  Marni Franzoni A  [facility] 04/13/2014, 7:55 AM   Patient stable, no change in medical condition, no bleeding. Discharge home today.

## 2014-04-13 NOTE — Discharge Instructions (Signed)
STROKE/TIA DISCHARGE INSTRUCTIONS SMOKING Cigarette smoking nearly doubles your risk of having a stroke & is the single most alterable risk factor  If you smoke or have smoked in the last 12 months, you are advised to quit smoking for your health.  Most of the excess cardiovascular risk related to smoking disappears within a year of stopping.  Ask you doctor about anti-smoking medications  [place] Quit Line: 1-800-QUIT NOW  Free Smoking Cessation Classes [phone]  CHOLESTEROL Know your levels; limit fat & cholesterol in your diet  Lipid Panel     Component Value Date/Time   CHOL 175 04/09/2014 0550   TRIG 105 04/09/2014 0550   HDL 41 04/09/2014 0550   CHOLHDL 4.3 04/09/2014 0550   VLDL 21 04/09/2014 0550   LDLCALC 113* 04/09/2014 0550      Many patients benefit from treatment even if their cholesterol is at goal.  Goal: Total Cholesterol (CHOL) less than 160  Goal:  Triglycerides (TRIG) less than 150  Goal:  HDL greater than 40  Goal:  LDL (LDLCALC) less than 100   BLOOD PRESSURE American Stroke Association blood pressure target is less that 120/80 mm/Hg  Your discharge blood pressure is:  BP: (!) 151/73 mmHg  Monitor your blood pressure  Limit your salt and alcohol intake  Many individuals will require more than one medication for high blood pressure  DIABETES (A1c is a blood sugar average for last 3 months) Goal HGBA1c is under 7% (HBGA1c is blood sugar average for last 3 months)  Diabetes:   Lab Results  Component Value Date   HGBA1C 9.3* 04/09/2014     Your HGBA1c can be lowered with medications, healthy diet, and exercise.  Check your blood sugar as directed by your physician  Call your physician if you experience unexplained or low blood sugars.  PHYSICAL ACTIVITY/REHABILITATION Goal is 30 minutes at least 4 days per week  Activity: Increase activity slowly, and Walk with assistance,   Activity decreases your risk of heart attack and stroke and makes  your heart stronger.  It helps control your weight and blood pressure; helps you relax and can improve your mood.  Participate in a regular exercise program.  Talk with your doctor about the best form of exercise for you (dancing, walking, swimming, cycling).  DIET/WEIGHT Goal is to maintain a healthy weight  Your discharge diet is: Diet - low sodium heart healthy Diet Carb Modified, thin liquids Your height is:  Height: 5\' 7"  (170.2 cm) Your current weight is: Weight: 81.874 kg (180 lb 8 oz) Your Body Mass Index (BMI) is:  BMI (Calculated): 28.3  Following the type of diet specifically designed for you will help prevent another stroke.  Your goal Body Mass Index (BMI) is 19-24.  Healthy food habits can help reduce 3 risk factors for stroke:  High cholesterol, hypertension, and excess weight.  RESOURCES Stroke/Support Group:  Call [phone]   STROKE EDUCATION PROVIDED/REVIEWED AND GIVEN TO PATIENT Stroke warning signs and symptoms How to activate emergency medical system (call 911). Medications prescribed at discharge. Need for follow-up after discharge. Personal risk factors for stroke. Pneumonia vaccine given: No Flu vaccine given: No My questions have been answered, the writing is legible, and I understand these instructions.  I will adhere to these goals & educational materials that have been provided to me after my discharge from the hospital.

## 2014-04-13 NOTE — Progress Notes (Signed)
Patient discharged home with wife and daughter. IVs removed, tele removed, discharge instructions and medications reviewed with patient and family. Both patient and family state they understand instructions.

## 2014-04-16 ENCOUNTER — Encounter (HOSPITAL_COMMUNITY): Payer: Self-pay | Admitting: Interventional Radiology

## 2014-04-16 ENCOUNTER — Other Ambulatory Visit: Payer: Self-pay | Admitting: Pulmonary Disease

## 2014-04-16 ENCOUNTER — Telehealth: Payer: Self-pay | Admitting: Pulmonary Disease

## 2014-04-16 ENCOUNTER — Other Ambulatory Visit: Payer: Self-pay | Admitting: Adult Health

## 2014-04-16 MED ORDER — CLOPIDOGREL BISULFATE 75 MG PO TABS: 75.0000 mg | ORAL_TABLET | Freq: Every day | ORAL | Status: DC

## 2014-04-16 NOTE — Telephone Encounter (Signed)
Pt wife requests that this be sent to [facility]([place])--request has been sent per pt request.  Dr Lenna Gilford, please advise on BS issues : Pt wife states that the patient BS is not maintaining and even with his current regimen, he is not able to keep sugar levels down. Pt has appt 04/19/14 and is requesting rec's in the meantime. Please advise, thanks.

## 2014-04-16 NOTE — Telephone Encounter (Signed)
lmomtcb x 1  For the pts wife to see where the medication will need to be sent.

## 2014-04-16 NOTE — Telephone Encounter (Signed)
Pt's wife returned call - [phone]

## 2014-04-17 ENCOUNTER — Telehealth: Payer: Self-pay | Admitting: Pulmonary Disease

## 2014-04-17 ENCOUNTER — Other Ambulatory Visit: Payer: Self-pay | Admitting: Adult Health

## 2014-04-17 ENCOUNTER — Other Ambulatory Visit: Payer: Self-pay | Admitting: Pulmonary Disease

## 2014-04-17 MED ORDER — SAXAGLIPTIN HCL 5 MG PO TABS: 5.0000 mg | ORAL_TABLET | Freq: Every day | ORAL | Status: DC

## 2014-04-17 NOTE — Telephone Encounter (Signed)
Called and spoke with pts wife and she stated that the pt was recently d/c from the hospital after having a stroke.  She stated that the pt is needing a refill of the onglyza.  This has been sent to [facility] and the pt will keep his appt with SN on Thursday.

## 2014-04-17 NOTE — Telephone Encounter (Signed)
Pt wife advised. [place] Bing, CMA

## 2014-04-17 NOTE — Telephone Encounter (Signed)
Per SN---  Labs on 8/15 were ok On lantus 10 units metfomin 500 mg  2 po bid Glyburide 5 mg  Every am onglyza 5 mg  1 daily  Increase the lantus to 15 units daily And keep follow up appt

## 2014-04-19 ENCOUNTER — Ambulatory Visit (INDEPENDENT_AMBULATORY_CARE_PROVIDER_SITE_OTHER): Payer: Medicare Other | Admitting: Pulmonary Disease

## 2014-04-19 ENCOUNTER — Encounter: Payer: Self-pay | Admitting: Pulmonary Disease

## 2014-04-19 VITALS — BP 132/80 | HR 97 | Temp 96.3°F | Ht 66.0 in | Wt 180.5 lb

## 2014-04-19 DIAGNOSIS — I679 Cerebrovascular disease, unspecified: Secondary | ICD-10-CM | POA: Diagnosis not present

## 2014-04-19 DIAGNOSIS — E7800 Pure hypercholesterolemia, unspecified: Secondary | ICD-10-CM

## 2014-04-19 DIAGNOSIS — IMO0002 Reserved for concepts with insufficient information to code with codable children: Secondary | ICD-10-CM

## 2014-04-19 DIAGNOSIS — I63032 Cerebral infarction due to thrombosis of left carotid artery: Secondary | ICD-10-CM | POA: Diagnosis not present

## 2014-04-19 DIAGNOSIS — I639 Cerebral infarction, unspecified: Secondary | ICD-10-CM

## 2014-04-19 DIAGNOSIS — M150 Primary generalized (osteo)arthritis: Secondary | ICD-10-CM

## 2014-04-19 DIAGNOSIS — N289 Disorder of kidney and ureter, unspecified: Secondary | ICD-10-CM

## 2014-04-19 DIAGNOSIS — C641 Malignant neoplasm of right kidney, except renal pelvis: Secondary | ICD-10-CM

## 2014-04-19 DIAGNOSIS — E118 Type 2 diabetes mellitus with unspecified complications: Secondary | ICD-10-CM | POA: Diagnosis not present

## 2014-04-19 DIAGNOSIS — M545 Low back pain, unspecified: Secondary | ICD-10-CM

## 2014-04-19 DIAGNOSIS — R413 Other amnesia: Secondary | ICD-10-CM

## 2014-04-19 DIAGNOSIS — E1165 Type 2 diabetes mellitus with hyperglycemia: Secondary | ICD-10-CM

## 2014-04-19 DIAGNOSIS — M159 Polyosteoarthritis, unspecified: Secondary | ICD-10-CM

## 2014-04-19 MED ORDER — INSULIN GLARGINE 100 UNITS/ML SOLOSTAR PEN: 15.0000 [IU] | PEN_INJECTOR | Freq: Every day | SUBCUTANEOUS | Status: DC

## 2014-04-19 NOTE — Progress Notes (Signed)
Subjective:    Patient ID: Peter Nicholson, male    DOB: 11-Dec-1940, 74 y.o.   MRN: [id number]  HPI 74 y/o WM here for a follow up visit... he Nicholson multiple medical problems as noted below...  ~  SEE PREV EPIC NOTES FOR OLDER DATA >>   ~  February 23, 2013:  24mo ROV & Peter Nicholson's CC is LBP & he is followed by DrElsner & DrHarkins for [facility] injections, last ?[id number] & he will call for f/u eval; he Nicholson Tramadol for pain... We reviewed the following medical problems during today's office visit >>     OSA> he's been off CPAP x yrs & denies sleep disordered breathing, denies snoring, feels he rests well, & wakes refreshed w/o daytime hypersomnolence...    Borderline HBP> not currently on meds, just diet controlled; BP= 140/86 & reminded to avoid sodium & get weight down; he is essentially asymptomatic w/o CP, palpit, SOB, edema, etc...    Cerebrovasc dis> on ASA81 & Plavix75; known severe extracranial cerebrovasc dis w/ left CAE 2000 by DrLawson & known severe bilat cavernous ICA stenoses; he is also s/p vertebral art angioplasty x2 by DrTDeveshwar w/ known basilar art occlusion;     Chol> on Simva40 + diet; FLP 10/14 showed TChol 136, TG 54, HDL 49, LDL 77... Continue same.    DM> on Lantus10u, Metform500-2Bid, Diabeta5, Onglyza5, off Actos; Labs 10/14 showed BS=252, A1c=9.1 & Lantus added; f/u labs 11/14 showed BS=182, A1c=8.3; continue same, get wt down.    Overweight> weight = 198# and BMI~33; we reviewed diet, exercise, wt reduction strategies...    GI- GERD, divertics> on Protonix40 daily; he had EGD 11/13 showing HH, chr GERD, stricture dil w/ 68F Maloney dilator, erosive gastritis was HPylori neg & treated w/ daily PPI rx.    GU- Hx horseshoe kidney, renal cell cancer, nephrolithiasis> right side of horseshoe kid removed 1994 (RCCa); followed by Urology for yrs & no recurrence; Creat in the 1.2-1.5 range; PSA in the 1-3 range...    DJD, Gout, LBP> on Allopurinol300, Tramadol50, Nortrip ?25mg ; followed by  DrElsner & DrHarkins on ESI injections & Nortrip?dose, last ?[id number] & he will call for f/u eval...    Neuro- c/o memory impairment> on ASA81 & Plavix75, plus MVI etc; he Nicholson severe intracranial atherosclerotic dis and chr cerebellar infarcts on prev scans; more recent memory problems felt to be mild cognitive impairment by DrSethi on eval 3/14 (but he is clearly high risk for vascular dementia problems)...     Anxiety> on Xanax0.5 prn...  We reviewed prob list, meds, xrays and labs> see below for updates >> he had the 2014 flu vaccine in Oct... Pt & wife reminded that he needs more reg medical follow up visits.  LABS 10/14 in Epic> REVIEWED.Marland KitchenMarland Kitchen   LABS 11/14:  [place]- [place] x BS=182, A1c=8.3, BUN=29, Cr=1.2.Marland Kitchen.   ~  May 24, 2013:  78mo ROV & DM recheck; Peter Nicholson lost 5-6# down to 193# on diet + exercise; he reports home BS checks 120-200 range, feeling well & "splitting wood daily"... He remains on Lantus10, Metform500-2Bid, Diabeta5, Onglyza5;  Labs today showed BS=89, A1c=7.3.Marland KitchenMarland Kitchen  We reviewed prob list, meds, xrays and labs> see below for updates >>   ~  November 21, 2013:  43mo ROV & Peter Nicholson to feel well, states doing well, no new complaints or concerns; "I feel as well as ever"- still works some as a Art gallery manager, mows 4 yards, and splits wood for the winter... We reviewed the  following medical problems during today's office visit >>     OSA> he's been off CPAP x yrs & denies sleep disordered breathing, denies snoring, feels he rests well, & wakes refreshed w/o daytime hypersomnolence but he takes a nap every day...    Borderline HBP> not currently on meds, just diet controlled; BP= 142/84 & reminded to avoid sodium & get weight down; he is essentially asymptomatic w/o CP, palpit, SOB, edema, etc...    Cerebrovasc dis> on ASA81 & Plavix75; known severe extracranial cerebrovasc dis w/ left CAE 2000 by DrLawson & known severe bilat cavernous ICA stenoses; he is also s/p vertebral art angioplasty x2 by  DrTDeveshwar w/ known basilar art occlusion...     Chol> on Simva40 + diet; FLP 10/14 showed TChol 136, TG 54, HDL 49, LDL 77... Continue same.    DM> on Lantus10u, Metform500-2Bid, Diabeta5, Onglyza5; Labs 8/15 showed BS=167, A1c=7.3.Marland KitchenMarland Kitchen Continue meds and get wt down.    Overweight> weight = 192# and BMI~31; we reviewed diet, exercise, wt reduction strategies...    GI- GERD, divertics> on Protonix40 daily; he had EGD 11/13 showing HH, chr GERD, stricture dil w/ 67F Maloney dilator, erosive gastritis was HPylori neg & treated w/ daily PPI rx.    GU- Hx horseshoe kidney, renal cell cancer, nephrolithiasis> right side of horseshoe kid removed 1994 (RCCa); followed by Urology for yrs & no recurrence; Creat in the 1.2-1.5 range; PSA in the 1-3 range...    DJD, Gout, LBP> on Allopurinol300, Tramadol50, Nortrip ?93m; followed by DrElsner & DrHarkins on ESI injections & Nortrip?dose, last ?[id number]& he will call for f/u eval...    Neuro- c/o memory impairment> on ASA81 & Plavix75, plus MVI etc; he Nicholson severe intracranial atherosclerotic dis and chr cerebellar infarcts on prev scans; more recent memory problems felt to be mild cognitive impairment by DrSethi on eval 3/14 (but he is clearly high risk for vascular dementia problems)...     Anxiety> on Xanax0.5 prn...  We reviewed prob list, meds, xrays and labs> see below for updates >>   LABS 8/15:  [place]- ok w/ BS=167, A1c=7.3, Cr=1.2..Marland KitchenMarland Kitchen  ~  April 19, 2014:  561moOV & s/p 2 recent hospitalizations...Marland KitchenMarland Kitchen   1) Adm 11/15- 02/28/14 by [facility] when he presented w/ dysarthria; hx severe vasc dis w/ known severe extracranial cerebrovasc dis w/ left CAE 2000 by DrLawson & known severe bilat cavernous ICA stenoses; he is also s/p vertebral art angioplasty x2 by DrTDeveshwar w/ known basilar art occlusion; he was taking meds on his own & not monitored by family- he had stopped his Plavix (confused, didn't remember), ?DM meds & Simva; Hosp eval included CT Head & MRI/MRA  Brain showing old cerebellar & thalamic infarcts and small vessel dis, acute sm nonhemorrhaghic infarcts throughout the left frontal/ temporal/ & parietal areas; Left ICA is occluded (reconstitution of flow in supraclinoid region appears inadeq), mod stenosis of Right ICA cavernous segment; occluded right vertebral and right PICA; high grade stenosis of Left vertebral art & mod narrowing of prox basilar art- see full report!  Subseq CT Angio Head & Neck showed prev Left CAE markedly attenuated & occluded at skullbase/ petrous segment of the left ICA, diminutive right vertebral that occludes at the skullbase, dominant left vertebral w/ hi-grade stenosis distally, diffuse atherosclerotic changes... 2DEcho was neg for embolic source... He was placed back on ASA/Plavix & no intervention recommended, reminded about need for strict regulation of BP, Chol, DM... Speech improved in hosp &  disch for outpt f/u w/ DrSethi (he Nicholson not returned).Marland KitchenMarland Kitchen    2) Adm 12/27- 04/13/14 by [facility] w/ confusion, ataxia, & speech difficulty w/ BS found to be 666 (this is when it was discovered that he was taking his own meds without supervision & several meds hadn't been filled in months); he had an Enterococcus UTI- treated w/ Augmentin; repeat MRI/MRA Brain w/o change from Nov; he was re-evaluated by [facility] for IR- cerebral angiography performed and attempted revasc of the left ICA was attempted but unsuccessful, he was disch home on same meds (ASA/Plavix)- see below- w/ supervision of all meds from wife; Lisinopril was held & they rec keeping BP in the 130-160 range...     We reviewed the following medical problems during today's office visit >>     OSA> he's been off CPAP x yrs & denies sleep disordered breathing, denies snoring, feels he rests well, & wakes refreshed w/o daytime hypersomnolence but he takes a nap every day...    Borderline HBP> not currently on meds, just diet controlled; BP= 132/80 & reminded to avoid sodium & get  weight down; he is essentially asymptomatic w/o CP, palpit, SOB, edema, etc...    Cerebrovasc dis> back on his ASA81 & Plavix75 (he had stopped on his own- now supervised by wife); known severe extracranial cerebrovasc dis w/ left CAE 2000 by [facility] & known severe bilat cavernous ICA stenoses (occluded left); he is also s/p vertebral art angioplasty x2 by Azell Der w/ known basilar art occlusion & severe intracranial atherosclerotic dis; Scans 11/15 revealed acute watershed infarcts in left frontal/ temporal/ parietal lobes & old infarcts in cerebellum & thalamus; DrTDeveshwar attempted intervention to left carotid system but this was reported as unsuccessful (unsucceeful attempt at revascularization of occluded LT ICA cavernous segment, multiple collaterals noted in the LT ICA cav occluded segment)...    Chol> on Lip80 now + diet; FLP 12/15 on Simva40 w/ ?compliance showed TChol 175, TG 105, HDL 41, LDL 113 => changed to Atorva80.    DM> on Lantus15u now, Metform500-2Bid, Diabeta5AM, Onglyza5PM; compliance w/ meds was poor when taking his own meds; Labs 12/15 showed BS=146-665, A1c=9.3 => now all supervised by wife...    Overweight> weight = 181# and BMI~29; we reviewed diet, exercise, wt reduction strategies...    GI- GERD, divertics> on Protonix40 daily; he had EGD 11/13 showing HH, chr GERD, stricture dil w/ 42F Maloney dilator, erosive gastritis was HPylori neg & treated w/ daily PPI rx.    GU- Hx horseshoe kidney, renal cell cancer, nephrolithiasis> right side of horseshoe kid removed 1994 (RCCa); followed by Urology for yrs & no recurrence; Creat in the 1.1-1.5 range; PSA in the 1-3 range...    DJD, Gout, LBP> on Allopurinol300, Tramadol50, Nortrip 25mg ; followed by DrElsner & DrHarkins on ESI injections & Nortrip rx, last ?June2014 & stable at present, prev mowing, yard work, Barista, etc...    Neuro- memory impairment> on ASA81 & Plavix75, plus MVI etc; he Nicholson severe intracranial  atherosclerotic dis, chr cerebellar infarcts on prev scans, & new watershed infarcts on left 11/15; memory problems felt to be mild cognitive impairment by DrSethi on eval 3/14, vascular dementia in light of numerous sm infarcts on scans...     Anxiety> on Xanax0.5 prn & Zoloft50...  We reviewed prob list, meds, xrays and labs> see below for updates >>   XRays, Scans, Angiograpy, Labs from 11-12/20015 all reviewed w/ pt & wife.Marland KitchenMarland Kitchen  Problem List:     Hearing Loss >> he had ENT eval by [facility] 3/15 w/ sensorineural hearing loss & rec to consider hearing aides...   OBSTRUCTIVE SLEEP APNEA (ICD-327.23) - s/p split night sleep study 11/08- showing an AHI of 19, assoc w/ an O2 sat down to 78%... during the CPAP trial he titrated up to a pressure of 11cm but never ret to REM sleep so optimal pressure is still ???...he uses [facility] for his CPAP needs... ~  12/10: he tells me that he Nicholson stopped the CPAP, that he sleeps better without it, not snoring/ rests well, & wakes feeling refreshed... he refuses Sleep Med re-eval etc... ~  11/14: he's been off CPAP x yrs & denies sleep disordered breathing, denies snoring, feels he rests well, & wakes refreshed w/o daytime hypersomnolence, he naps daily... ~  2015> he reports stable & denies sleep disordered breathing...  Hx of HYPERTENSION >>   ~  not currently on medications =>  ~  6/12: BP= 136/80 off meds & denies HA, visual changes, CP, palipit, dizziness, syncope, dyspnea, edema, etc... he states that home BP checks are "OK". ~  CXR 11/12 showed borderline heart size, tort Ao, clear lungs w/ ?underlying COPD, NAD...  ~  1/13: he was sent home from the [facility] on Lisinopril & Lasix; both stopped 1/13 by TP w/ renal insuffic & elev K+; subseq improved & BP stable... ~  2/13: BP= 112/68 & feeling weak etc...  3/13:  BP= 140/72 & feeling some better... ~  6/13:  BP= 138/78 & feeling much better; denies CP, palpit, ch in SOB or  edema... ~  10/13:  BP= 140/82 & he is c/o reflux & dysphagia today, no CP/ palpit/ SOB/ edema... ~  11/14: not currently on meds, just diet controlled; BP= 140/86 & reminded to avoid sodium & get weight down; he is essentially asymptomatic w/o CP, palpit, SOB, edema, etc. ~  2/15: still not on meds, just diet controlled; BP= 124/76 & he remains asymptomatic...  ~  8/15: not currently on meds, just diet controlled; BP= 142/84 & reminded to avoid sodium & get weight down; he is essentially asymptomatic w/o CP, palpit, SOB, edema, etc ~  1/16: still on meds, just diet controlled; BP= 132/80 & Neuro Nicholson rec BP range of 130-160 to aid perfusion due to his severe cerebrovasc dis...  CEREBROVASCULAR DISEASE (ICD-437.9) - stable on ASA $Remo'325mg'UuGjX$ /d & PLAVIX $Remove'75mg'xrlvzeb$ /d per DrDeveshwar/ DrLawson/ DrReynolds... he Nicholson severe extracranial cerebrovasc disease- esp in the post circulation, and he is s/p vertebral art angioplasty x 2, and w/ a known basilar art occlusion- prev on Coumadin controlled by Neurology (switched to ASA/ Plavix in 2010)... also followed by Sheryn Bison and DrDeveshwar for IR... he had a left CAE in 6/00 by [facility]... ~  CDoppler 2/07 showed 0-39% bilat ICA stenoses, s/p left CAE w/ DPA... ~  eval 3/10 by Sheryn Bison w/ CDopplers that showed similar patency w/ 0-39% bilat ICA stenoses; due to the 10 yr interval DrLawson did not feel he needed any additional follow up. ~  12/10: continued f/u DrDevershwar IR w/ MRI/ MRAs showing bilat carotid siphon region atherosclerosis & narrowing (unchanged), and markedly diseased distal left vertebral & prox basilar art narrowing (no change)... ~  11/12: CTA head & neck showed similar severe dis w/ mod to severe bilat cavernous ICA stenoses due to extensive atherosclerosis; severe distal vertebral atherosclerosis- distal right occluded, distal left w/ high grade stenosis; mod to severe  bilat PCA stenoses; chr cerebellar infarcts, no acute infarcts. ~  11/14: on ASA81 &  Plavix75; known severe extracranial cerebrovasc dis w/ left CAE 2000 by DrLawson & known severe bilat cavernous ICA stenoses; he is also s/p vertebral art angioplasty x2 by DrTDeveshwar w/ known basilar art occlusion ~  8/15: on ASA81 & Plavix75; known severe extracranial cerebrovasc dis w/ left CAE 2000 by DrLawson & known severe bilat cavernous ICA stenoses; he is also s/p vertebral art angioplasty x2 by DrTDeveshwar w/ known basilar art occlusion, he endorses medication compliance & denies cerebral ischemic symptoms... ~  11/15: Hosp by [facility] when he presented w/ dysarthria; hx severe vasc dis w/ known severe extracranial cerebrovasc dis w/ left CAE 2000 by [facility] & known severe bilat cavernous ICA stenoses; he is also s/p vertebral art angioplasty x2 by DrTDeveshwar w/ known basilar art occlusion; he was taking meds on his own & not monitored by family- he had stopped his Plavix (confused, didn't remember), ?DM meds & Simva; Hosp eval included CT Head & MRI/MRA Brain showing old cerebellar & thalamic infarcts and small vessel dis, acute sm nonhemorrhaghic infarcts throughout the left frontal/ temporal/ & parietal areas; Left ICA is occluded (reconstitution of flow in supraclinoid region appears inadeq), mod stenosis of Right ICA cavernous segment; occluded right vertebral and right PICA; high grade stenosis of Left vertebral art & mod narrowing of prox basilar art- see full report!  Subseq CT Angio Head & Neck showed prev Left CAE markedly attenuated & occluded at skullbase/ petrous segment of the left ICA, diminutive right vertebral that occludes at the skullbase, dominant left vertebral w/ hi-grade stenosis distally, diffuse atherosclerotic changes... 2DEcho was neg for embolic source... He was placed back on ASA/Plavix & no intervention recommended, reminded about need for strict regulation of BP, Chol, DM... Speech improved in [place] for outpt f/u w/ DrSethi (he did not return)... ~  12/15: Hosp  by [facility] w/ confusion, ataxia, & speech difficulty w/ BS found to be 666 (this is when it was discovered that he was taking his own meds without supervision & several meds hadn't been filled in months); he had an Enterococcus UTI- treated w/ Augmentin; repeat MRI/MRA Brain w/o change from Nov; he was re-evaluated by [facility] for IR- cerebral angiography performed and attempted revasc of the left ICA was attempted but unsuccessful, he was disch home on same meds (ASA/Plavix) w/ supervision of all meds from wife; Lisinopril was held & they rec keeping BP in the 130-160 range...   VENOUS INSUFFICIENCY (ICD-459.81) - he knows to avoid sodium, elevate legs, wear support hose...  HYPERCHOLESTEROLEMIA (ICD-272.0) - on CRESTOR $RemoveBe'10mg'kybtPZRUr$ /d since Hosp 12/12... ~  [place] 6/08 showed TChol 153, TG 120, HDL 36, LDL 93 ~  FLP 8/09 showed TChol 131, TG 84, HDL 38, LDL 76 ~  2010:  pt was not fasting for blood work... rec to continue Crestor10 + diet efforts. ~  FLP 10/11 showed TChol 210, TG 161, HDL 40, LDL 149... rec to start Crestor $RemoveBefor'20mg'QCyrzTsJwqWW$ /d, he will decide. ~  FLP 6/12 on Cres20 showed TChol 106, TG 95, HDL 41, LDL 46... Continue same. ~  [place] 12/12 in [place] (on Cres20 PTA) showed TChol 100, TG 101, HDL 37, LDL 43... They decr to Cres10. ~  FLP 3/13 on Cres10 showed TChol 118, TG 67, HDL 44, LDL 61... Continue same, later switched to Simva40 for $$ reasons. ~  FLP 10/14 on Simva40 showed TChol 136, TG 54, HDL 49, LDL 77.Marland KitchenMarland Kitchen  Continue same. ~  FLP 12/15 on Simva40 w/ ?compliance showed TChol 175, TG 105, HDL 41, LDL 113 => changed to [place] w/ supervision of all meds by wife.  DIABETES MELLITUS (ICD-250.00) >>  ~  on METFORMIN 500mg - 2Bid, GLYBURIDE 5mg Bid, ACTOS 30mg /d, & ONGLYZA 5mg /d... ~  labs 6/08 showed BS= 155, HgA1c= 8.1.Marland Kitchen. (glyburide incr to 5Bid). ~  labs 11/08 showed BS= 352, HgA1c= 8.1.... (Actos added). ~  labs 8/09 showed BS= 128, HgA1c= 7.1.Marland KitchenMarland Kitchen rec- same meds, better diet, get wt down! ~  labs 2/10  showed BS= 249, A1c= 7.3.... Metform incr to 2Bid. ~  labs 12/10 (nonfasting- on all 3 meds) showed BS= 87, A1c= 6.9 ~  labs 10/11 on Metform1000Bid+Gybur5Bid showed BS= 180, A1c= 8.4.Marland KitchenMarland Kitchen rec to incr GLYBUR10Bid + diet. ~  labs 6/12 on Metform1000Bid+Glybur10Bid showed BS= 196, A1c= 11.2 ?what happened?> he declined insulin & Endocrine consult, opted for max oral meds by adding ACTOS 30mg /d & ONGLYZA 5mg /d... ~  Labs 12/12 in [place] showed BS 150-200+ and A1c= 7.2; covered w/ SSI in hosp & placed back on 4 oral meds on disch from rehab... ~  Labs 1/13 on all 4 meds showed BS= 230, A1c=7.1; we reviewed diet & he will start PT soon... ~  Labs 3/13 on 8meds showed BS= 72, A1c= 6.6 ~  4/13: pt called w/ hypoglycemia & we decreased Glyburide 5mg  from 2Bid to 1Bid... ~  6/13:  Pt on Metform500-2Bid, Glybur5mg Bid, Actos30, Onglyza5; BS at home 70-90 he says & still drinking Boost; Labs showed BS=190  A1c=7.4  ; we decided to cut the Glyburide5mg  Qam only but keep the Metform500-2Bid +Actos +Onglyza... ~  10/13: on his 32meds> BS=171, A1c=7.7 but he's gained 16#; told to STOP the boost, get on diet, get wt down, take all meds every day... ~  10/14: seen by TP> Labs 10/14 showed BS=252, A1c=9.1 & Lantus added to his Metform500-2Bid, Diabeta5, Onglyza5; Lantus10u added... ~  11/14: on Lantus10u, Metform500-2Bid, Diabeta5, Onglyza5, off Actos; f/u labs 11/14 showed BS=182, A1c=8.3; continue same, get wt down. ~  8/15: on Lantus10u, Metform500-2Bid, Diabeta5, Onglyza5; Labs 8/15 showed BS=167, A1c=7.3.Marland KitchenMarland Kitchen Continue meds and get wt down. ~  12/15: now on Lantus15u now, Metform500-2Bid, Diabeta5AM, Onglyza5PM; compliance w/ meds was poor when taking his own meds; Labs 12/15 showed BS=146-665, A1c=9.3 => now all supervised by wife  OVERWEIGHT (ICD-278.02) - he states that he is not on much of a diet & we reviewed low carb/ no sweets/ low fat diet. ~  weight 8/09 = 216# ~  weight 2/10 = 216# ~  weight 12/10 = 212# ~   weight 10/11 = 210# ~  Weight 6/12 = 206# and he admits to not being on much of a diet... ~  Weight 2/13 = non weight bearing still... ~  Weight 3/13 = 193# ~  Weight 6/13 = 191# ~  Weight 10/13 = 206# ~  Weight 11/14 = 198# ~  Weight 2/15 = 193# ~  Weight 1/16 = 181#  GERD (ICD-530.81) - prev on Nexium40, he switched to OTC Prilosec, now [place] 40mg /d due to Plavix Rx; pt had stopped this on his own & asked to restart 10/13. ~  EGD 19/03 showed [place], GERD & esoph stricture dilated... ~  EGD 12/09 by DrPatterson showed HH & severe erosive esophagitis from GERD... ~  10/13: presents w/ incr reflux- w/ dysphagia & food sticking in mid-esoph that occurs every 2-3days & Nicholson to vomit to feel better; he had stopped his  PPI on his own (?why?), rec to restart the Protonix40 & we will refer to GI for EGD & dilatation... ~  10/13: he saw DrPatterson> EGD 11/13 showed HH, chr GERD, stricture dil w/ 40F Maloney dilator, erosive gastritis was HPylori neg & treated w/ daily PPI rx...  ~  He remains on Protonix40/d & doing satis w/o reflux symptoms...  DIVERTICULOSIS OF COLON (ICD-562.10) - note: he was hosp in [place] briefly 12/09 for fecal impaction; prev on [place] but recently noted loose stools on the Metformin 1000mg Bid... ~  colonoscopy 10/03 by DrPatterson showed divertics only... ~  colonoscopy 12/09 by DrPatterson was also normal x for divertics... ~  He denies n/v, c/d, blood seen...  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 Hx of RENAL CELL CANCER (ICD-189.0) - he had a partial nephrectomy of the right side of his horseshoe kidney in 1994 due to renal cell ca... still followed by [facility] yearly by his hx but we don't have any recent notes... ~  labs 10/11 showed PSA= 0.95 ~  Labs 3/13 showed BUN=21, Creat=1.1, PSA=2.75 ~  Labs 10/13 showed BUN= 25, Creat= 1.2 ~  Labs 10/14 showed BUN=38, Cr=1.5 (w/ enterococcus UTI);  Labs 11/14  showed BUN=29, Cr=1.2 ~  Labs 8/15 showed BUN= 24, Cr= 1.2 ~  Labs 12/15 showed Cr= 1.1-1.4  Hx of NEPHROLITHIASIS (ICD-592.0) - remote hx of kidney stones followed by DrPeterson...  DEGENERATIVE JOINT DISEASE (ICD-715.90) &  GOUT (ICD-274.9) - on ALLOPURINOL 300mg /d... ~  labs 10/11 showed URIC= 4.7 ~  He was [facility] 11/26 - 03/27/11 after fall from ladder cleaning gutters w/ complex right tibial plat fx- ORIF, 2 operations by DrHardy, & hosp course complic by ileus, TIA, electrolyte abn, etc;  Event disch to Rehab & he was attended by [facility]...  BACK PAIN, LUMBAR (ICD-724.2) - known degenerative spondylitic disease at L4-5 and L5-S1 prev followed by [facility] (now DrElsner & DrHarkins)... ~  6/12: he reports further back pain w/ eval by Chiropractor showing L4 disc prob & regular adjustments; but now notes incr pain & he requests referral to Neurosurg for further eval ==> seen by DrElsner, then DrHarkins for shots which Nicholson helped... ~  2014: he Nicholson continued f/u w/ DrHarkins/ [facility] & received periodic ESI injections for his Lumbar DDD; they added NORTRIPTYLINE 25mg Qhs & slowly incr the dose  NEURO >> Hx Left Hemispheric TIA in 11/12 & MEMORY IMPAIRMENT ~  3/14:  He had Neuro eval DrSethi> he did Labs, EEG, MRI Brain, & MRA Brain & Neck; Rec to take 2 FishOil caps daily, CerefolinNAC daily, work puzzles etc, continue Plavix & strict control of BP, Chol, DM; they did not start Aricept... ~  11/15 & 12/15> see above> Hosp w/ wastershed strokes left frontal/ temporal/ parietal areas when he wasn't taking his Plavix; now all meds supervised by wife; he had MRIs, CT Angio, and Arteriograms w/ attempted intervention on left by DrTDeveshwar- unsuccessful & rec for continued ASA/ Plavix, & control on BP, Chol, DM w/ BP in the 130-160 range...  ANXIETY (ICD-300.00) - uses Zolloft50 & ALPRAZOLAM 0.5mg  as needed.   Past Surgical History  Procedure Laterality Date  . Incisional hernia  repair  1/96    Dr.Leone  . Nasal sinus surgery  12/96    Dr.Redman  . Cholecystectomy  9/04    Dr. Rebekah Chesterfield  . 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  Carotid endarterectomy  09/2000    Dr.Lawson  . Veterbral art angioplasty      x2  . Small intestine surgery    . External fixation leg  03/10/2011    Procedure: EXTERNAL FIXATION LEG;  Surgeon: Rozanna Box;  Location: [place];  Service: Orthopedics;  Laterality: Right;  . Orif tibia plateau  03/24/2011    Procedure: OPEN REDUCTION INTERNAL FIXATION (ORIF) TIBIAL PLATEAU;  Surgeon: Rozanna Box;  Location: [place];  Service: Orthopedics;  Laterality: Right;  . Radiology with anesthesia N/A 04/12/2014    Procedure: ANGIOPLASTY;  Surgeon: Rob Hickman, MD;  Location: [place];  Service: Radiology;  Laterality: N/A;    Outpatient Encounter Prescriptions as of 04/19/2014  Medication Sig  . allopurinol (ZYLOPRIM) 300 MG tablet TAKE 1 TABLET (300 MG TOTAL) BY MOUTH DAILY.  Marland Kitchen ALPRAZolam (XANAX) 0.5 MG tablet TAKE ONE-HALF TO ONE TABLET BY MOUTH THREE TIMES DAILY AS NEEDED FOR NERVES  . ampicillin (PRINCIPEN) 500 MG capsule Take 1 capsule (500 mg total) by mouth every 8 (eight) hours.  Marland Kitchen aspirin EC 81 MG EC tablet Take 1 tablet (81 mg total) by mouth daily.  Marland Kitchen atorvastatin (LIPITOR) 80 MG tablet Take 1 tablet (80 mg total) by mouth daily at 6 PM.  . Blood Glucose Monitoring Suppl (ONE TOUCH ULTRA SYSTEM KIT) W/DEVICE KIT Test blood sugar up to 3 times daily  . clopidogrel (PLAVIX) 75 MG tablet TAKE ONE TABLET BY MOUTH EVERY DAY  . clopidogrel (PLAVIX) 75 MG tablet Take 1 tablet (75 mg total) by mouth daily.  Marland Kitchen glucose blood test strip Test blood sugar up to 3 times daily  . glyBURIDE (DIABETA) 5 MG tablet Take 5 mg by mouth daily with breakfast.   . glyBURIDE (DIABETA) 5 MG tablet TAKE ONE TABLET BY MOUTH EVERY MORNING WITH BREAKFAST  . Insulin Glargine (LANTUS SOLOSTAR) 100 UNIT/ML Solostar Pen Inject 10 Units  into the skin at bedtime.  . metFORMIN (GLUCOPHAGE) 500 MG tablet Take 2 tablets by mouth two times daily  . nortriptyline (PAMELOR) 25 MG capsule Take 25 mg by mouth at bedtime.  . pantoprazole (PROTONIX) 40 MG tablet TAKE 1 TABLET BY MOUTH ONCE DAILY AT NOON  . RELION SHORT PEN NEEDLES 31G X 8 MM MISC USE WITH LANTUS SOLOSTAR PEN ONCE DAILY AS DIRECTED  . saxagliptin HCl (ONGLYZA) 5 MG TABS tablet Take 1 tablet (5 mg total) by mouth daily.  . sertraline (ZOLOFT) 50 MG tablet Take 1 tablet (50 mg total) by mouth at bedtime.  . traMADol (ULTRAM) 50 MG tablet Take 50 mg by mouth every 6 (six) hours as needed for pain.     Allergies  Allergen Reactions  . Dilaudid [Hydromorphone Hcl] Other (See Comments)    hallucinations  . Fentanyl Other (See Comments)    hallucinations    Current Medications, Allergies, Past Medical History, Past Surgical History, Family History, and Social History were reviewed in [employer] record.    Review of Systems        See HPI - all other systems neg except as noted...  The patient complains of dyspnea on exertion.  The patient denies anorexia, fever, weight loss, weight gain, vision loss, decreased hearing, hoarseness, chest pain, syncope, peripheral edema, prolonged cough, headaches, hemoptysis, abdominal pain, melena, hematochezia, severe indigestion/heartburn, hematuria, incontinence, muscle weakness, suspicious skin lesions, transient blindness, difficulty walking, depression, unusual weight change, abnormal bleeding, enlarged lymph nodes, and angioedema.     Objective:  Physical Exam     WD, WN, 74 y/o WM in NAD...  GENERAL:  Alert & oriented; pleasant & cooperative... HEENT:  [place]/AT, EOM-wnl, PERRLA, EACs-clear, TMs-wnl, NOSE-clear, THROAT-clear & wnl. NECK:  Supple w/ fairROM; no JVD; s/p left CAE, +bruits; no thyromegaly or nodules palpated; no lymphadenopathy. CHEST:  Clear to P & A; without wheezes/ rales/ or rhonchi  heard... HEART:  Regular Rhythm; gr 1/6 SEM without rubs or gallops detected... ABDOMEN:  Soft & nontender; normal bowel sounds; no organomegaly or masses palpated... EXT: right knee deformity s/p ORIF, mod arthritic changes; no varicose veins/ +venous insuffic/ 1+ edema. NEURO:  CN's intact; no focal neuro deficits... DERM:  No lesions noted; no rash etc...  RADIOLOGY DATA:  Reviewed in the EPIC EMR & discussed w/ the patient...  LABORATORY DATA:  Reviewed in the EPIC EMR & discussed w/ the patient...   Assessment & Plan:    CEREBROVASC Disease & STROKE 11/15 & TIA 12/12>  Known severe cerebrovasc dis & he had TIA while in hosp for fx leg 2012 (he was off plavix at the time); Recurrent prob 11/15 w/ watershed infarcts in left frontal/ temporal/ parietal regions while he was off Plavix (he was taking meds on his own & hadn't been filling the Plavix); s/p extensive eval w/ MRI/MRA, CT Angio & Arteriogram by Ladene Artist w/ attempt at intervention on left unsuccessful; wife now supervises all meds...  NEURO> Mild Cognitive Impairment & he is at risk for vascular dementia; rec to maintain careful medical followup of BP, Chol, DM, & get the weight down!  CHOL>  on Atrova80 now & f/u FLP planned...  DM>  On Lantus15, Metform, Diabeta, Onglyza but compliance was poor; now supervised by wife...  Overweight>  We discussed low carb, low fat wt reducing diet...  GI>  GERD prev controlled w/ Protonix but he stopped on his own (?why?); EGD 11/13 w/ dilatation & reminded to take the Protonix40 every day...  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  DJD/ Hx Gout/ LBP>  He was seeing chiropractor regularly regarding LBP & then f/u w/ DrElsner,Neurosurg & had ESI by DrHarkins & the shots have helped...  s/p fall w/ right Tibial plateaux fx> 1st ext fixation, 2nd ORIF>  By DrHardy, Ortho; exercise is difficult but very much needed; he's had extensive PT  training...  Anxiety>  He remains on Alpraz Prn...   Patient's Medications  New Prescriptions   INSULIN GLARGINE (LANTUS) 100 UNIT/ML SOPN    Inject 0.15 mLs (15 Units total) into the skin at bedtime.  Previous Medications   ALLOPURINOL (ZYLOPRIM) 300 MG TABLET    TAKE 1 TABLET (300 MG TOTAL) BY MOUTH DAILY.   ALPRAZOLAM (XANAX) 0.5 MG TABLET    TAKE ONE-HALF TO ONE TABLET BY MOUTH THREE TIMES DAILY AS NEEDED FOR NERVES   ASPIRIN EC 81 MG EC TABLET    Take 1 tablet (81 mg total) by mouth daily.   ATORVASTATIN (LIPITOR) 80 MG TABLET    Take 1 tablet (80 mg total) by mouth daily at 6 PM.   BLOOD GLUCOSE MONITORING SUPPL (ONE TOUCH ULTRA SYSTEM KIT) W/DEVICE KIT    Test blood sugar up to 3 times daily   CLOPIDOGREL (PLAVIX) 75 MG TABLET    Take 1 tablet (75 mg total) by mouth daily.   GLUCOSE BLOOD TEST STRIP    Test blood sugar up to 3 times daily   GLYBURIDE (DIABETA)  5 MG TABLET    TAKE ONE TABLET BY MOUTH EVERY MORNING WITH BREAKFAST   METFORMIN (GLUCOPHAGE) 500 MG TABLET    Take 2 tablets by mouth two times daily   NORTRIPTYLINE (PAMELOR) 25 MG CAPSULE    Take 25 mg by mouth at bedtime.   PANTOPRAZOLE (PROTONIX) 40 MG TABLET    TAKE 1 TABLET BY MOUTH ONCE DAILY AT NOON   RELION SHORT PEN NEEDLES 31G X 8 MM MISC    USE WITH LANTUS SOLOSTAR PEN ONCE DAILY AS DIRECTED   SAXAGLIPTIN HCL (ONGLYZA) 5 MG TABS TABLET    Take 1 tablet (5 mg total) by mouth daily.   SERTRALINE (ZOLOFT) 50 MG TABLET    Take 1 tablet (50 mg total) by mouth at bedtime.   TRAMADOL (ULTRAM) 50 MG TABLET    Take 50 mg by mouth every 6 (six) hours as needed for pain.   Modified Medications   No medications on file  Discontinued Medications   AMPICILLIN (PRINCIPEN) 500 MG CAPSULE    Take 1 capsule (500 mg total) by mouth every 8 (eight) hours.   CLOPIDOGREL (PLAVIX) 75 MG TABLET    TAKE ONE TABLET BY MOUTH EVERY DAY   GLYBURIDE (DIABETA) 5 MG TABLET    Take 5 mg by mouth daily with breakfast.    INSULIN GLARGINE  (LANTUS SOLOSTAR) 100 UNIT/ML SOLOSTAR PEN    Inject 10 Units into the skin at bedtime.

## 2014-04-19 NOTE — Patient Instructions (Signed)
Today we updated your med list in our EPIC system...    Continue your current medications the same...  We reviewed your medications and recommend getting a copy of your [place]...    We will review this and work with you to save as much $$$ as poss on your meds...  Be sure to follow a strict low carb diet & avoid salt/ sodium...  Call for any questions...  Let's plan a follow up visit in 78mo, sooner if needed for problems.Marland KitchenMarland Kitchen

## 2014-04-20 ENCOUNTER — Other Ambulatory Visit (HOSPITAL_COMMUNITY): Payer: Self-pay | Admitting: Interventional Radiology

## 2014-04-20 ENCOUNTER — Telehealth (HOSPITAL_COMMUNITY): Payer: Self-pay | Admitting: Interventional Radiology

## 2014-04-20 DIAGNOSIS — I771 Stricture of artery: Secondary | ICD-10-CM

## 2014-04-20 NOTE — Telephone Encounter (Signed)
Called pt, left VM for him to call to schedule f/u consult. JM

## 2014-04-23 ENCOUNTER — Other Ambulatory Visit (HOSPITAL_COMMUNITY): Payer: Medicare Other

## 2014-04-25 ENCOUNTER — Inpatient Hospital Stay (HOSPITAL_COMMUNITY): Admission: RE | Admit: 2014-04-25 | Payer: Medicare Other | Source: Ambulatory Visit

## 2014-05-09 ENCOUNTER — Telehealth: Payer: Self-pay | Admitting: Pulmonary Disease

## 2014-05-09 MED ORDER — GLUCOSE BLOOD VI STRP: ORAL_STRIP | Status: DC

## 2014-05-09 NOTE — Telephone Encounter (Signed)
Spoke with Marjory Lies at [facility], states that pt is trying to switch test strip rx from [facility] to [facility] and per Medicare they need a new rx for this.  Sending in rx. Nothing further needed.

## 2014-05-16 ENCOUNTER — Telehealth: Payer: Self-pay | Admitting: Pulmonary Disease

## 2014-05-16 NOTE — Telephone Encounter (Signed)
lmomtcb x1 

## 2014-05-17 ENCOUNTER — Telehealth (HOSPITAL_COMMUNITY): Payer: Self-pay | Admitting: Interventional Radiology

## 2014-05-17 ENCOUNTER — Ambulatory Visit (HOSPITAL_COMMUNITY): Admission: RE | Admit: 2014-05-17 | Payer: Medicare Other | Source: Ambulatory Visit

## 2014-05-17 NOTE — Telephone Encounter (Signed)
Pt was scheduled to come in for a clinical visit today 05/17/14 at 2 pm. Per Deveshwar he does not need to see this patient in consult unless the pt is symptomatic or has questions but just needs to know he will be contacted in 6 month's time for a f/u MRI/MRA. I called pt and asked him if has any symptoms and he states that he is "doing just fine." I told him he did not need to come in today unless he wanted to for any reason and that we would schedule his f/u MRI/MRA in 6 month's time. Pt was in complete agreement with this and stated understanding. JM

## 2014-05-17 NOTE — Telephone Encounter (Signed)
LMTCB x2  

## 2014-05-18 ENCOUNTER — Ambulatory Visit: Payer: Medicare Other | Admitting: *Deleted

## 2014-05-18 NOTE — Telephone Encounter (Signed)
lmtcb x3 

## 2014-05-21 ENCOUNTER — Telehealth: Payer: Self-pay | Admitting: Pulmonary Disease

## 2014-05-21 MED ORDER — ATORVASTATIN CALCIUM 80 MG PO TABS: 80.0000 mg | ORAL_TABLET | Freq: Every day | ORAL | Status: DC

## 2014-05-21 NOTE — Telephone Encounter (Signed)
Pt has not returned our calls. Will close message at this time.

## 2014-05-21 NOTE — Telephone Encounter (Signed)
Called and spoke with pts wife and she is aware of medication refill that has been sent to the pharmacy for the pt.  She stated that the pt is coming in on Thursday to get his labs done.  Nothing further is needed.

## 2014-05-23 ENCOUNTER — Ambulatory Visit: Payer: Medicare Other | Admitting: Pulmonary Disease

## 2014-05-24 ENCOUNTER — Ambulatory Visit: Payer: Medicare Other | Admitting: Pulmonary Disease

## 2014-05-25 ENCOUNTER — Other Ambulatory Visit (INDEPENDENT_AMBULATORY_CARE_PROVIDER_SITE_OTHER): Payer: Medicare Other

## 2014-05-25 ENCOUNTER — Ambulatory Visit (INDEPENDENT_AMBULATORY_CARE_PROVIDER_SITE_OTHER): Payer: Medicare Other | Admitting: Pulmonary Disease

## 2014-05-25 ENCOUNTER — Encounter: Payer: Self-pay | Admitting: Pulmonary Disease

## 2014-05-25 ENCOUNTER — Ambulatory Visit: Payer: Medicare Other | Admitting: Pulmonary Disease

## 2014-05-25 VITALS — BP 150/80 | HR 80 | Temp 96.7°F | Ht 66.0 in | Wt 184.0 lb

## 2014-05-25 DIAGNOSIS — E118 Type 2 diabetes mellitus with unspecified complications: Secondary | ICD-10-CM | POA: Diagnosis not present

## 2014-05-25 DIAGNOSIS — N289 Disorder of kidney and ureter, unspecified: Secondary | ICD-10-CM

## 2014-05-25 DIAGNOSIS — I679 Cerebrovascular disease, unspecified: Secondary | ICD-10-CM | POA: Diagnosis not present

## 2014-05-25 DIAGNOSIS — E1165 Type 2 diabetes mellitus with hyperglycemia: Secondary | ICD-10-CM

## 2014-05-25 DIAGNOSIS — M545 Low back pain, unspecified: Secondary | ICD-10-CM

## 2014-05-25 DIAGNOSIS — IMO0002 Reserved for concepts with insufficient information to code with codable children: Secondary | ICD-10-CM

## 2014-05-25 DIAGNOSIS — C641 Malignant neoplasm of right kidney, except renal pelvis: Secondary | ICD-10-CM

## 2014-05-25 DIAGNOSIS — M159 Polyosteoarthritis, unspecified: Secondary | ICD-10-CM

## 2014-05-25 DIAGNOSIS — I639 Cerebral infarction, unspecified: Secondary | ICD-10-CM

## 2014-05-25 DIAGNOSIS — I63032 Cerebral infarction due to thrombosis of left carotid artery: Secondary | ICD-10-CM

## 2014-05-25 DIAGNOSIS — E7800 Pure hypercholesterolemia, unspecified: Secondary | ICD-10-CM

## 2014-05-25 DIAGNOSIS — R413 Other amnesia: Secondary | ICD-10-CM

## 2014-05-25 DIAGNOSIS — M150 Primary generalized (osteo)arthritis: Secondary | ICD-10-CM

## 2014-05-25 LAB — BASIC METABOLIC PANEL
BUN: 18 mg/dL (ref 6–23)
CHLORIDE: 110 meq/L (ref 96–112)
CO2: 26 mEq/L (ref 19–32)
Calcium: 9.7 mg/dL (ref 8.4–10.5)
Creatinine, Ser: 1.12 mg/dL (ref 0.40–1.50)
GFR: 68.02 mL/min (ref >60.00)
Glucose, Bld: 96 mg/dL (ref 70–99)
POTASSIUM: 4.4 meq/L (ref 3.5–5.1)
Sodium: 142 mEq/L (ref 135–145)

## 2014-05-25 LAB — HEMOGLOBIN A1C: Hgb A1c MFr Bld: 7.6 % — ABNORMAL HIGH (ref 4.6–6.5)

## 2014-05-25 NOTE — Patient Instructions (Signed)
Today we updated your med list in our EPIC system...    Continue your current medications the same...  Today we did your follow up blood work & A1c...    We will contact you w/ the results when available...   Keep up the good work w/ diet & exercise...    Continue to work on weight reduction...  Call for any questions...  Let's plan a follow up visit in 3-43mo, sooner if needed for problems.Marland KitchenMarland Kitchen

## 2014-05-26 ENCOUNTER — Encounter: Payer: Self-pay | Admitting: Pulmonary Disease

## 2014-05-26 NOTE — Progress Notes (Signed)
Subjective:    Patient ID: Peter Nicholson, male    DOB: 01-29-1941, 74 y.o.   MRN: [id number]  HPI 74 y/o WM here for a follow up visit... he has multiple medical problems as noted below...  ~  SEE PREV EPIC NOTES FOR OLDER DATA >>   ~  February 23, 2013:  44mo ROV & Peter Nicholson's CC is LBP & he is followed by DrElsner & DrHarkins for [employer] injections, last ?[id number] & he will call for f/u eval; he has Tramadol for pain... We reviewed the following medical problems during today's office visit >>     OSA> he's been off CPAP x yrs & denies sleep disordered breathing, denies snoring, feels he rests well, & wakes refreshed w/o daytime hypersomnolence...    Borderline HBP> not currently on meds, just diet controlled; BP= 140/86 & reminded to avoid sodium & get weight down; he is essentially asymptomatic w/o CP, palpit, SOB, edema, etc...    Cerebrovasc dis> on ASA81 & Plavix75; known severe extracranial cerebrovasc dis w/ left CAE 2000 by DrLawson & known severe bilat cavernous ICA stenoses; he is also s/p vertebral art angioplasty x2 by DrTDeveshwar w/ known basilar art occlusion;     Chol> on Simva40 + diet; FLP 10/14 showed TChol 136, TG 54, HDL 49, LDL 77... Continue same.    DM> on Lantus10u, Metform500-2Bid, Diabeta5, Onglyza5, off Actos; Labs 10/14 showed BS=252, A1c=9.1 & Lantus added; f/u labs 11/14 showed BS=182, A1c=8.3; continue same, get wt down.    Overweight> weight = 198# and BMI~33; we reviewed diet, exercise, wt reduction strategies...    GI- GERD, divertics> on Protonix40 daily; he had EGD 11/13 showing HH, chr GERD, stricture dil w/ 7F Maloney dilator, erosive gastritis was HPylori neg & treated w/ daily PPI rx.    GU- Hx horseshoe kidney, renal cell cancer, nephrolithiasis> right side of horseshoe kid removed 1994 (RCCa); followed by Urology for yrs & no recurrence; Creat in the 1.2-1.5 range; PSA in the 1-3 range...    DJD, Gout, LBP> on Allopurinol300, Tramadol50, Nortrip ?25mg ; followed by  DrElsner & DrHarkins on ESI injections & Nortrip?dose, last ?[id number] & he will call for f/u eval...    Neuro- c/o memory impairment> on ASA81 & Plavix75, plus MVI etc; he has severe intracranial atherosclerotic dis and chr cerebellar infarcts on prev scans; more recent memory problems felt to be mild cognitive impairment by DrSethi on eval 3/14 (but he is clearly high risk for vascular dementia problems)...     Anxiety> on Xanax0.5 prn...  We reviewed prob list, meds, xrays and labs> see below for updates >> he had the 2014 flu vaccine in Oct... Pt & wife reminded that he needs more reg medical follow up visits.  LABS 10/14 in Epic> REVIEWED.Marland KitchenMarland Kitchen   LABS 11/14:  [place]- [place] x BS=182, A1c=8.3, BUN=29, Cr=1.2.Marland Kitchen.   ~  May 24, 2013:  8mo ROV & DM recheck; Peter Nicholson has lost 5-6# down to 193# on diet + exercise; he reports home BS checks 120-200 range, feeling well & "splitting wood daily"... He remains on Lantus10, Metform500-2Bid, Diabeta5, Onglyza5;  Labs today showed BS=89, A1c=7.3.Marland KitchenMarland Kitchen  We reviewed prob list, meds, xrays and labs> see below for updates >>   ~  November 21, 2013:  44mo ROV & Peter Nicholson continues to feel well, states doing well, no new complaints or concerns; "I feel as well as ever"- still works some as a Art gallery manager, mows 4 yards, and splits wood for the winter... We reviewed the  following medical problems during today's office visit >>     OSA> he's been off CPAP x yrs & denies sleep disordered breathing, denies snoring, feels he rests well, & wakes refreshed w/o daytime hypersomnolence but he takes a nap every day...    Borderline HBP> not currently on meds, just diet controlled; BP= 142/84 & reminded to avoid sodium & get weight down; he is essentially asymptomatic w/o CP, palpit, SOB, edema, etc...    Cerebrovasc dis> on ASA81 & Plavix75; known severe extracranial cerebrovasc dis w/ left CAE 2000 by DrLawson & known severe bilat cavernous ICA stenoses; he is also s/p vertebral art angioplasty x2 by  DrTDeveshwar w/ known basilar art occlusion...     Chol> on Simva40 + diet; FLP 10/14 showed TChol 136, TG 54, HDL 49, LDL 77... Continue same.    DM> on Lantus10u, Metform500-2Bid, Diabeta5, Onglyza5; Labs 8/15 showed BS=167, A1c=7.3.Marland KitchenMarland Kitchen Continue meds and get wt down.    Overweight> weight = 192# and BMI~31; we reviewed diet, exercise, wt reduction strategies...    GI- GERD, divertics> on Protonix40 daily; he had EGD 11/13 showing HH, chr GERD, stricture dil w/ 67F Maloney dilator, erosive gastritis was HPylori neg & treated w/ daily PPI rx.    GU- Hx horseshoe kidney, renal cell cancer, nephrolithiasis> right side of horseshoe kid removed 1994 (RCCa); followed by Urology for yrs & no recurrence; Creat in the 1.2-1.5 range; PSA in the 1-3 range...    DJD, Gout, LBP> on Allopurinol300, Tramadol50, Nortrip ?93m; followed by DrElsner & DrHarkins on ESI injections & Nortrip?dose, last ?[id number]& he will call for f/u eval...    Neuro- c/o memory impairment> on ASA81 & Plavix75, plus MVI etc; he has severe intracranial atherosclerotic dis and chr cerebellar infarcts on prev scans; more recent memory problems felt to be mild cognitive impairment by DrSethi on eval 3/14 (but he is clearly high risk for vascular dementia problems)...     Anxiety> on Xanax0.5 prn...  We reviewed prob list, meds, xrays and labs> see below for updates >>   LABS 8/15:  [place]- ok w/ BS=167, A1c=7.3, Cr=1.2..Marland KitchenMarland Kitchen  ~  April 19, 2014:  561moOV & s/p 2 recent hospitalizations...Marland KitchenMarland Kitchen   1) Adm 11/15- 02/28/14 by [facility] when he presented w/ dysarthria; hx severe vasc dis w/ known severe extracranial cerebrovasc dis w/ left CAE 2000 by DrLawson & known severe bilat cavernous ICA stenoses; he is also s/p vertebral art angioplasty x2 by DrTDeveshwar w/ known basilar art occlusion; he was taking meds on his own & not monitored by family- he had stopped his Plavix (confused, didn't remember), ?DM meds & Simva; Hosp eval included CT Head & MRI/MRA  Brain showing old cerebellar & thalamic infarcts and small vessel dis, acute sm nonhemorrhaghic infarcts throughout the left frontal/ temporal/ & parietal areas; Left ICA is occluded (reconstitution of flow in supraclinoid region appears inadeq), mod stenosis of Right ICA cavernous segment; occluded right vertebral and right PICA; high grade stenosis of Left vertebral art & mod narrowing of prox basilar art- see full report!  Subseq CT Angio Head & Neck showed prev Left CAE markedly attenuated & occluded at skullbase/ petrous segment of the left ICA, diminutive right vertebral that occludes at the skullbase, dominant left vertebral w/ hi-grade stenosis distally, diffuse atherosclerotic changes... 2DEcho was neg for embolic source... He was placed back on ASA/Plavix & no intervention recommended, reminded about need for strict regulation of BP, Chol, DM... Speech improved in hosp &  disch for outpt f/u w/ DrSethi (he has not returned).Marland KitchenMarland Kitchen    2) Adm 12/27- 04/13/14 by [facility] w/ confusion, ataxia, & speech difficulty w/ BS found to be 666 (this is when it was discovered that he was taking his own meds without supervision & several meds hadn't been filled in months); he had an Enterococcus UTI- treated w/ Augmentin; repeat MRI/MRA Brain w/o change from Nov; he was re-evaluated by [facility] for IR- cerebral angiography performed and attempted revasc of the left ICA was attempted but unsuccessful, he was disch home on same meds (ASA/Plavix)- see below- w/ supervision of all meds from wife; Lisinopril was held & they rec keeping BP in the 130-160 range...     We reviewed the following medical problems during today's office visit >>     OSA> he's been off CPAP x yrs & denies sleep disordered breathing, denies snoring, feels he rests well, & wakes refreshed w/o daytime hypersomnolence but he takes a nap every day...    Borderline HBP> not currently on meds, just diet controlled; BP= 132/80 & reminded to avoid sodium & get  weight down; he is essentially asymptomatic w/o CP, palpit, SOB, edema, etc...    Cerebrovasc dis> back on his ASA81 & Plavix75 (he had stopped on his own- now supervised by wife); known severe extracranial cerebrovasc dis w/ left CAE 2000 by [facility] & known severe bilat cavernous ICA stenoses (occluded left); he is also s/p vertebral art angioplasty x2 by Azell Der w/ known basilar art occlusion & severe intracranial atherosclerotic dis; Scans 11/15 revealed acute watershed infarcts in left frontal/ temporal/ parietal lobes & old infarcts in cerebellum & thalamus; DrTDeveshwar attempted intervention to left carotid system but this was reported as unsuccessful (unsucceeful attempt at revascularization of occluded LT ICA cavernous segment, multiple collaterals noted in the LT ICA cav occluded segment)...    Chol> on Lip80 now + diet; FLP 12/15 on Simva40 w/ ?compliance showed TChol 175, TG 105, HDL 41, LDL 113 => changed to Atorva80.    DM> on Lantus15u now, Metform500-2Bid, Diabeta5AM, Onglyza5PM; compliance w/ meds was poor when taking his own meds; Labs 12/15 showed BS=146-665, A1c=9.3 => now all supervised by wife...    Overweight> weight = 181# and BMI~29; we reviewed diet, exercise, wt reduction strategies...    GI- GERD, divertics> on Protonix40 daily; he had EGD 11/13 showing HH, chr GERD, stricture dil w/ 58F Maloney dilator, erosive gastritis was HPylori neg & treated w/ daily PPI rx.    GU- Hx horseshoe kidney, renal cell cancer, nephrolithiasis> right side of horseshoe kid removed 1994 (RCCa); followed by Urology for yrs & no recurrence; Creat in the 1.1-1.5 range; PSA in the 1-3 range...    DJD, Gout, LBP> on Allopurinol300, Tramadol50, Nortrip 74m; followed by DrElsner & DrHarkins on ESI injections & Nortrip rx, last ?June2014 & stable at present, prev mowing, yard work, cBarista etc...    Neuro- memory impairment> on ASA81 & Plavix75, plus MVI etc; he has severe intracranial  atherosclerotic dis, chr cerebellar infarcts on prev scans, & new watershed infarcts on left 11/15; memory problems felt to be mild cognitive impairment by DrSethi on eval 3/14, vascular dementia in light of numerous sm infarcts on scans...     Anxiety> on Xanax0.5 prn & Zoloft50...  We reviewed prob list, meds, xrays and labs> see below for updates >>   XRays, Scans, Angiograpy, Labs from 11-12/20015 all reviewed w/ pt & wife...  ~  May 25, 2014:  165moOV &  here for DM recheck> on Lantus15, Metform500-2Bid, Diabeta5Qam, Onglyza5Qpm; all meds supervised by wife now; they state accuchecks are up & down (55-95 on the low, and 190-350 on the high);  Labs today showed BS=96, A1c improved to 7.6 => we decided to continue same meds & diet, ROV 3 months...  In addition his BP is sl elev 170/80 by nurse and 150/80 by me; wife states that Neuro wanted his BP sl higher due to severe vasc dis & perfusion concerns;  We decided to add LOSARTAN50 for his BP & vasc dis;  Continue other meds the same...           Problem List:     Hearing Loss >> he had ENT eval by [facility] 3/15 w/ sensorineural hearing loss & rec to consider hearing aides...   OBSTRUCTIVE SLEEP APNEA (ICD-327.23) - s/p split night sleep study 11/08- showing an AHI of 19, assoc w/ an O2 sat down to 78%... during the CPAP trial he titrated up to a pressure of 11cm but never ret to REM sleep so optimal pressure is still ???...he uses [facility] for his CPAP needs... ~  12/10: he tells me that he has stopped the CPAP, that he sleeps better without it, not snoring/ rests well, & wakes feeling refreshed... he refuses Sleep Med re-eval etc... ~  11/14: he's been off CPAP x yrs & denies sleep disordered breathing, denies snoring, feels he rests well, & wakes refreshed w/o daytime hypersomnolence, he naps daily... ~  2015> he reports stable & denies sleep disordered breathing...  Hx of HYPERTENSION >>   ~  not currently on  medications =>  ~  6/12: BP= 136/80 off meds & denies HA, visual changes, CP, palipit, dizziness, syncope, dyspnea, edema, etc... he states that home BP checks are "OK". ~  CXR 11/12 showed borderline heart size, tort Ao, clear lungs w/ ?underlying COPD, NAD...  ~  1/13: he was sent home from the [place] on Lisinopril & Lasix; both stopped 1/13 by TP w/ renal insuffic & elev K+; subseq improved & BP stable... ~  2/13: BP= 112/68 & feeling weak etc...  3/13:  BP= 140/72 & feeling some better... ~  6/13:  BP= 138/78 & feeling much better; denies CP, palpit, ch in SOB or edema... ~  10/13:  BP= 140/82 & he is c/o reflux & dysphagia today, no CP/ palpit/ SOB/ edema... ~  11/14: not currently on meds, just diet controlled; BP= 140/86 & reminded to avoid sodium & get weight down; he is essentially asymptomatic w/o CP, palpit, SOB, edema, etc. ~  2/15: still not on meds, just diet controlled; BP= 124/76 & he remains asymptomatic...  ~  8/15: not currently on meds, just diet controlled; BP= 142/84 & reminded to avoid sodium & get weight down; he is essentially asymptomatic w/o CP, palpit, SOB, edema, etc ~  1/16: still not on meds, just diet controlled; BP= 132/80 & Neuro has rec BP range of 130-160 to aid perfusion due to his severe cerebrovasc dis... ~  2/16: BP 170/80 => recheck 150/80 range; we decided to add Losartan $RemoveBefo'50mg'TqViXueQWFf$ /d for BP & vasc dis...  CEREBROVASCULAR DISEASE (ICD-437.9) - stable on ASA $Remo'325mg'Rnrye$ /d & PLAVIX $Remove'75mg'YJIhVQO$ /d per DrDeveshwar/ DrLawson/ DrReynolds... he has severe extracranial cerebrovasc disease- esp in the post circulation, and he is s/p vertebral art angioplasty x 2, and w/ a known basilar art occlusion- prev on Coumadin controlled by Neurology (switched to ASA/ Plavix in 2010)... also followed  by Sheryn Bison and DrDeveshwar for IR... he had a left CAE in 6/00 by [facility]... ~  CDoppler 2/07 showed 0-39% bilat ICA stenoses, s/p left CAE w/ DPA... ~  eval 3/10 by Sheryn Bison w/ CDopplers that  showed similar patency w/ 0-39% bilat ICA stenoses; due to the 10 yr interval DrLawson did not feel he needed any additional follow up. ~  12/10: continued f/u DrDevershwar IR w/ MRI/ MRAs showing bilat carotid siphon region atherosclerosis & narrowing (unchanged), and markedly diseased distal left vertebral & prox basilar art narrowing (no change)... ~  11/12: CTA head & neck showed similar severe dis w/ mod to severe bilat cavernous ICA stenoses due to extensive atherosclerosis; severe distal vertebral atherosclerosis- distal right occluded, distal left w/ high grade stenosis; mod to severe bilat PCA stenoses; chr cerebellar infarcts, no acute infarcts. ~  11/14: on ASA81 & Plavix75; known severe extracranial cerebrovasc dis w/ left CAE 2000 by DrLawson & known severe bilat cavernous ICA stenoses; he is also s/p vertebral art angioplasty x2 by DrTDeveshwar w/ known basilar art occlusion ~  8/15: on ASA81 & Plavix75; known severe extracranial cerebrovasc dis w/ left CAE 2000 by DrLawson & known severe bilat cavernous ICA stenoses; he is also s/p vertebral art angioplasty x2 by DrTDeveshwar w/ known basilar art occlusion, he endorses medication compliance & denies cerebral ischemic symptoms... ~  11/15: Hosp by [facility] when he presented w/ dysarthria; hx severe vasc dis w/ known severe extracranial cerebrovasc dis w/ left CAE 2000 by [facility] & known severe bilat cavernous ICA stenoses; he is also s/p vertebral art angioplasty x2 by DrTDeveshwar w/ known basilar art occlusion; he was taking meds on his own & not monitored by family- he had stopped his Plavix (confused, didn't remember), ?DM meds & Simva; Hosp eval included CT Head & MRI/MRA Brain showing old cerebellar & thalamic infarcts and small vessel dis, acute sm nonhemorrhaghic infarcts throughout the left frontal/ temporal/ & parietal areas; Left ICA is occluded (reconstitution of flow in supraclinoid region appears inadeq), mod stenosis of Right ICA  cavernous segment; occluded right vertebral and right PICA; high grade stenosis of Left vertebral art & mod narrowing of prox basilar art- see full report!  Subseq CT Angio Head & Neck showed prev Left CAE markedly attenuated & occluded at skullbase/ petrous segment of the left ICA, diminutive right vertebral that occludes at the skullbase, dominant left vertebral w/ hi-grade stenosis distally, diffuse atherosclerotic changes... 2DEcho was neg for embolic source... He was placed back on ASA/Plavix & no intervention recommended, reminded about need for strict regulation of BP, Chol, DM... Speech improved in [place] for outpt f/u w/ DrSethi (he did not return)... ~  12/15: Hosp by [facility] w/ confusion, ataxia, & speech difficulty w/ BS found to be 666 (this is when it was discovered that he was taking his own meds without supervision & several meds hadn't been filled in months); he had an Enterococcus UTI- treated w/ Augmentin; repeat MRI/MRA Brain w/o change from Nov; he was re-evaluated by [facility] for IR- cerebral angiography performed and attempted revasc of the left ICA was attempted but unsuccessful, he was disch home on same meds (ASA/Plavix) w/ supervision of all meds from wife; Lisinopril was held & they rec keeping BP in the 130-160 range...   VENOUS INSUFFICIENCY (ICD-459.81) - he knows to avoid sodium, elevate legs, wear support hose...  HYPERCHOLESTEROLEMIA (ICD-272.0) - on CRESTOR $RemoveBe'10mg'PvcneuNvJ$ /d since Hosp 12/12... ~  [place] 6/08 showed TChol 153, TG 120, HDL  36, LDL 93 ~  FLP 8/09 showed TChol 131, TG 84, HDL 38, LDL 76 ~  2010:  pt was not fasting for blood work... rec to continue Crestor10 + diet efforts. ~  FLP 10/11 showed TChol 210, TG 161, HDL 40, LDL 149... rec to start Crestor 20mg /d, he will decide. ~  FLP 6/12 on Cres20 showed TChol 106, TG 95, HDL 41, LDL 46... Continue same. ~  [place] 12/12 in [place] (on Cres20 PTA) showed TChol 100, TG 101, HDL 37, LDL 43... They decr to Cres10. ~  FLP  3/13 on Cres10 showed TChol 118, TG 67, HDL 44, LDL 61... Continue same, later switched to Simva40 for $$ reasons. ~  FLP 10/14 on Simva40 showed TChol 136, TG 54, HDL 49, LDL 77... Continue same. ~  FLP 12/15 on Simva40 w/ ?compliance showed TChol 175, TG 105, HDL 41, LDL 113 => changed to [place] w/ supervision of all meds by wife.  DIABETES MELLITUS (ICD-250.00) >>  ~  on METFORMIN 500mg - 2Bid, GLYBURIDE 5mg Bid, ACTOS 30mg /d, & ONGLYZA 5mg /d... ~  labs 6/08 showed BS= 155, HgA1c= 8.1.Marland Kitchen. (glyburide incr to 5Bid). ~  labs 11/08 showed BS= 352, HgA1c= 8.1.... (Actos added). ~  labs 8/09 showed BS= 128, HgA1c= 7.1.Marland KitchenMarland Kitchen rec- same meds, better diet, get wt down! ~  labs 2/10 showed BS= 249, A1c= 7.3.... Metform incr to 2Bid. ~  labs 12/10 (nonfasting- on all 3 meds) showed BS= 87, A1c= 6.9 ~  labs 10/11 on Metform1000Bid+Gybur5Bid showed BS= 180, A1c= 8.4.Marland KitchenMarland Kitchen rec to incr GLYBUR10Bid + diet. ~  labs 6/12 on Metform1000Bid+Glybur10Bid showed BS= 196, A1c= 11.2 ?what happened?> he declined insulin & Endocrine consult, opted for max oral meds by adding ACTOS 30mg /d & ONGLYZA 5mg /d... ~  Labs 12/12 in [place] showed BS 150-200+ and A1c= 7.2; covered w/ SSI in hosp & placed back on 4 oral meds on disch from rehab... ~  Labs 1/13 on all 4 meds showed BS= 230, A1c=7.1; we reviewed diet & he will start PT soon... ~  Labs 3/13 on 109meds showed BS= 72, A1c= 6.6 ~  4/13: pt called w/ hypoglycemia & we decreased Glyburide 5mg  from 2Bid to 1Bid... ~  6/13:  Pt on Metform500-2Bid, Glybur5mg Bid, Actos30, Onglyza5; BS at home 70-90 he says & still drinking Boost; Labs showed BS=190  A1c=7.4  ; we decided to cut the Glyburide5mg  Qam only but keep the Metform500-2Bid +Actos +Onglyza... ~  10/13: on his 26meds> BS=171, A1c=7.7 but he's gained 16#; told to STOP the boost, get on diet, get wt down, take all meds every day... ~  10/14: seen by TP> Labs 10/14 showed BS=252, A1c=9.1 & Lantus added to his Metform500-2Bid, Diabeta5,  Onglyza5; Lantus10u added... ~  11/14: on Lantus10u, Metform500-2Bid, Diabeta5, Onglyza5, off Actos; f/u labs 11/14 showed BS=182, A1c=8.3; continue same, get wt down. ~  8/15: on Lantus10u, Metform500-2Bid, Diabeta5, Onglyza5; Labs 8/15 showed BS=167, A1c=7.3.Marland KitchenMarland Kitchen Continue meds and get wt down. ~  12/15: now on Lantus15u now, Metform500-2Bid, Diabeta5AM, Onglyza5PM; compliance w/ meds was poor when taking his own meds; Labs 12/15 showed BS=146-665, A1c=9.3 => now all supervised by wife. ~  2/16: on Lantus15u now, Metform500-2Bid, Diabeta5AM, Onglyza5PM; Labs 2/16 showed BS=96, A1c=7.6, therefore continue same meds & diet...  OVERWEIGHT (ICD-278.02) - he states that he is not on much of a diet & we reviewed low carb/ no sweets/ low fat diet. ~  weight 8/09 = 216# ~  weight 2/10 = 216# ~  weight 12/10 = 212# ~  weight 10/11 = 210# ~  Weight 6/12 = 206# and he admits to not being on much of a diet... ~  Weight 2/13 = non weight bearing still... ~  Weight 3/13 = 193# ~  Weight 6/13 = 191# ~  Weight 10/13 = 206# ~  Weight 11/14 = 198# ~  Weight 2/15 = 193# ~  Weight 1/16 = 181#  GERD (ICD-530.81) - prev on Nexium40, he switched to OTC Prilosec, now [place] 40mg /d due to Plavix Rx; pt had stopped this on his own & asked to restart 10/13. ~  EGD 19/03 showed [place], GERD & esoph stricture dilated... ~  EGD 12/09 by DrPatterson showed HH & severe erosive esophagitis from GERD... ~  10/13: presents w/ incr reflux- w/ dysphagia & food sticking in mid-esoph that occurs every 2-3days & has to vomit to feel better; he had stopped his PPI on his own (?why?), rec to restart the Protonix40 & we will refer to GI for EGD & dilatation... ~  10/13: he saw DrPatterson> EGD 11/13 showed HH, chr GERD, stricture dil w/ 7F Maloney dilator, erosive gastritis was HPylori neg & treated w/ daily PPI rx...  ~  He remains on Protonix40/d & doing satis w/o reflux symptoms...  DIVERTICULOSIS OF COLON (ICD-562.10) - note:  he was hosp in [place] briefly 12/09 for fecal impaction; prev on [place] but recently noted loose stools on the Metformin 1000mg Bid... ~  colonoscopy 10/03 by DrPatterson showed divertics only... ~  colonoscopy 12/09 by DrPatterson was also normal x for divertics... ~  He denies n/v, c/d, blood seen...  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 Hx of RENAL CELL CANCER (ICD-189.0) - he had a partial nephrectomy of the right side of his horseshoe kidney in 1994 due to renal cell ca... still followed by [place] yearly by his hx but we don't have any recent notes... ~  labs 10/11 showed PSA= 0.95 ~  Labs 3/13 showed BUN=21, Creat=1.1, PSA=2.75 ~  Labs 10/13 showed BUN= 25, Creat= 1.2 ~  Labs 10/14 showed BUN=38, Cr=1.5 (w/ enterococcus UTI);  Labs 11/14 showed BUN=29, Cr=1.2 ~  Labs 8/15 showed BUN= 24, Cr= 1.2 ~  Labs 12/15 showed Cr= 1.1-1.4 ~  Labs 2/16 showed BUN= 18, Cr= 1.12  Hx of NEPHROLITHIASIS (ICD-592.0) - remote hx of kidney stones followed by DrPeterson...  DEGENERATIVE JOINT DISEASE (ICD-715.90) &  GOUT (ICD-274.9) - on ALLOPURINOL 300mg /d... ~  labs 10/11 showed URIC= 4.7 ~  He was [facility] 11/26 - 03/27/11 after fall from ladder cleaning gutters w/ complex right tibial plat fx- ORIF, 2 operations by DrHardy, & hosp course complic by ileus, TIA, electrolyte abn, etc;  Event disch to Rehab & he was attended by [facility]...  BACK PAIN, LUMBAR (ICD-724.2) - known degenerative spondylitic disease at L4-5 and L5-S1 prev followed by [facility] (now DrElsner & DrHarkins)... ~  6/12: he reports further back pain w/ eval by Chiropractor showing L4 disc prob & regular adjustments; but now notes incr pain & he requests referral to Neurosurg for further eval ==> seen by DrElsner, then DrHarkins for shots which has helped... ~  2014: he has continued f/u w/ DrHarkins/ [facility] & received periodic ESI injections for his Lumbar  DDD; they added NORTRIPTYLINE 25mg Qhs & slowly incr the dose  NEURO >> Hx Left Hemispheric TIA in 11/12 & MEMORY IMPAIRMENT ~  3/14:  He had Neuro eval DrSethi> he did Labs, EEG, MRI  Brain, & MRA Brain & Neck; Rec to take 2 FishOil caps daily, CerefolinNAC daily, work puzzles etc, continue Plavix & strict control of BP, Chol, DM; they did not start Aricept... ~  11/15 & 12/15> see above> Hosp w/ wastershed strokes left frontal/ temporal/ parietal areas when he wasn't taking his Plavix; now all meds supervised by wife; he had MRIs, CT Angio, and Arteriograms w/ attempted intervention on left by DrTDeveshwar- unsuccessful & rec for continued ASA/ Plavix, & control on BP, Chol, DM w/ BP in the 130-160 range...  ANXIETY (ICD-300.00) - uses Zolloft50 & ALPRAZOLAM 0.$RemoveBefore'5mg'rdBcyNFfsvgbi$  as needed.   Past Surgical History  Procedure Laterality Date  . Incisional hernia repair  1/96    Dr.Leone  . Nasal sinus surgery  12/96    Dr.Redman  . Cholecystectomy  9/04    Dr. Rebekah Chesterfield  . 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 Carotid endarterectomy  09/2000    Dr.Lawson  . Veterbral art angioplasty      x2  . Small intestine surgery    . External fixation leg  03/10/2011    Procedure: EXTERNAL FIXATION LEG;  Surgeon: Rozanna Box;  Location: [facility];  Service: Orthopedics;  Laterality: Right;  . Orif tibia plateau  03/24/2011    Procedure: OPEN REDUCTION INTERNAL FIXATION (ORIF) TIBIAL PLATEAU;  Surgeon: Rozanna Box;  Location: [facility];  Service: Orthopedics;  Laterality: Right;  . Radiology with anesthesia N/A 04/12/2014    Procedure: ANGIOPLASTY;  Surgeon: Rob Hickman, MD;  Location: [facility];  Service: Radiology;  Laterality: N/A;    Outpatient Encounter Prescriptions as of 05/25/2014  Medication Sig  . allopurinol (ZYLOPRIM) 300 MG tablet TAKE 1 TABLET (300 MG TOTAL) BY MOUTH DAILY.  Marland Kitchen ALPRAZolam (XANAX) 0.5 MG tablet TAKE ONE-HALF TO ONE TABLET BY MOUTH THREE TIMES DAILY AS  NEEDED FOR NERVES  . aspirin EC 81 MG EC tablet Take 1 tablet (81 mg total) by mouth daily.  Marland Kitchen atorvastatin (LIPITOR) 80 MG tablet Take 1 tablet (80 mg total) by mouth daily.  . Blood Glucose Monitoring Suppl (ONE TOUCH ULTRA SYSTEM KIT) W/DEVICE KIT Test blood sugar up to 3 times daily  . clopidogrel (PLAVIX) 75 MG tablet Take 1 tablet (75 mg total) by mouth daily.  Marland Kitchen glucose blood test strip Test blood sugar up to 3 times daily  . glyBURIDE (DIABETA) 5 MG tablet TAKE ONE TABLET BY MOUTH EVERY MORNING WITH BREAKFAST  . insulin glargine (LANTUS) 100 unit/mL SOPN Inject 0.15 mLs (15 Units total) into the skin at bedtime.  . metFORMIN (GLUCOPHAGE) 500 MG tablet Take 2 tablets by mouth two times daily  . nortriptyline (PAMELOR) 25 MG capsule Take 25 mg by mouth at bedtime.  . pantoprazole (PROTONIX) 40 MG tablet TAKE 1 TABLET BY MOUTH ONCE DAILY AT NOON  . RELION SHORT PEN NEEDLES 31G X 8 MM MISC USE WITH LANTUS SOLOSTAR PEN ONCE DAILY AS DIRECTED  . saxagliptin HCl (ONGLYZA) 5 MG TABS tablet Take 1 tablet (5 mg total) by mouth daily.  . sertraline (ZOLOFT) 50 MG tablet Take 1 tablet (50 mg total) by mouth at bedtime.  . traMADol (ULTRAM) 50 MG tablet Take 50 mg by mouth every 6 (six) hours as needed for pain.     Allergies  Allergen Reactions  . Dilaudid [Hydromorphone Hcl] Other (See Comments)    hallucinations  . Fentanyl Other (See Comments)    hallucinations  Current Medications, Allergies, Past Medical History, Past Surgical History, Family History, and Social History were reviewed in [employer] record.    Review of Systems        See HPI - all other systems neg except as noted...  The patient complains of dyspnea on exertion.  The patient denies anorexia, fever, weight loss, weight gain, vision loss, decreased hearing, hoarseness, chest pain, syncope, peripheral edema, prolonged cough, headaches, hemoptysis, abdominal pain, melena, hematochezia, severe  indigestion/heartburn, hematuria, incontinence, muscle weakness, suspicious skin lesions, transient blindness, difficulty walking, depression, unusual weight change, abnormal bleeding, enlarged lymph nodes, and angioedema.     Objective:   Physical Exam     WD, WN, 74 y/o WM in NAD...  GENERAL:  Alert & oriented; pleasant & cooperative... HEENT:  [place]/AT, EOM-wnl, PERRLA, EACs-clear, TMs-wnl, NOSE-clear, THROAT-clear & wnl. NECK:  Supple w/ fairROM; no JVD; s/p left CAE, +bruits; no thyromegaly or nodules palpated; no lymphadenopathy. CHEST:  Clear to P & A; without wheezes/ rales/ or rhonchi heard... HEART:  Regular Rhythm; gr 1/6 SEM without rubs or gallops detected... ABDOMEN:  Soft & nontender; normal bowel sounds; no organomegaly or masses palpated... EXT: right knee deformity s/p ORIF, mod arthritic changes; no varicose veins/ +venous insuffic/ 1+ edema. NEURO:  CN's intact; no focal neuro deficits... DERM:  No lesions noted; no rash etc...  RADIOLOGY DATA:  Reviewed in the EPIC EMR & discussed w/ the patient...  LABORATORY DATA:  Reviewed in the EPIC EMR & discussed w/ the patient...   Assessment & Plan:    BP>  Neuro has wanted his BP to be sl higher than usual for perfusion reasons but BP= 170/80 on arrival 7 improved to 150/80 w/ rest; we opted to start Losartan $RemoveBefore'50mg'SDfRlxDgdgjWE$ /d for BP & vasc dis...   CEREBROVASC Disease & STROKE 11/15 & TIA 12/12>  Known severe cerebrovasc dis & he had TIA while in hosp for fx leg 2012 (he was off plavix at the time); Recurrent prob 11/15 w/ watershed infarcts in left frontal/ temporal/ parietal regions while he was off Plavix (he was taking meds on his own & hadn't been filling the Plavix); s/p extensive eval w/ MRI/MRA, CT Angio & Arteriogram by Ladene Artist w/ attempt at intervention on left unsuccessful; wife now supervises all meds...   NEURO> Mild Cognitive Impairment & he is at risk for vascular dementia; rec to maintain careful medical followup of  BP, Chol, DM, & get the weight down!  CHOL>  on Atrova80 now & f/u FLP planned...  DM>  On Lantus15, Metform, Diabeta, Onglyza & compliance is better w/ wife supervision; Labs 2/16 showed BS=96, A1c=7.6, continue same...  Overweight>  We discussed low carb, low fat wt reducing diet...  GI>  GERD prev controlled w/ Protonix but he stopped on his own (?why?); EGD 11/13 w/ dilatation & reminded to take the Protonix40 every day...  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  DJD/ Hx Gout/ LBP>  He was seeing chiropractor regularly regarding LBP & then f/u w/ DrElsner,Neurosurg & had ESI by DrHarkins & the shots have helped...  s/p fall w/ right Tibial plateaux fx> 1st ext fixation, 2nd ORIF>  By DrHardy, Ortho; exercise is difficult but very much needed; he's had extensive PT training...  Anxiety>  He remains on Alpraz Prn...   Patient's Medications  New Prescriptions  Start LOSARTAN $RemoveBefore'50mg'aQVsEbrrkzJzD$  - one tab po Qd...  Previous Medications   ALLOPURINOL (ZYLOPRIM) 300 MG TABLET    TAKE 1 TABLET (300 MG TOTAL) BY MOUTH DAILY.   ALPRAZOLAM (XANAX) 0.5 MG TABLET    TAKE ONE-HALF TO ONE TABLET BY MOUTH THREE TIMES DAILY AS NEEDED FOR NERVES   ASPIRIN EC 81 MG EC TABLET    Take 1 tablet (81 mg total) by mouth daily.   ATORVASTATIN (LIPITOR) 80 MG TABLET    Take 1 tablet (80 mg total) by mouth daily.   BLOOD GLUCOSE MONITORING SUPPL (ONE TOUCH ULTRA SYSTEM KIT) W/DEVICE KIT    Test blood sugar up to 3 times daily   CLOPIDOGREL (PLAVIX) 75 MG TABLET    Take 1 tablet (75 mg total) by mouth daily.   GLUCOSE BLOOD TEST STRIP    Test blood sugar up to 3 times daily   GLYBURIDE (DIABETA) 5 MG TABLET    TAKE ONE TABLET BY MOUTH EVERY MORNING WITH BREAKFAST   INSULIN GLARGINE (LANTUS) 100 UNIT/ML SOPN    Inject 0.15 mLs (15 Units total) into the skin at bedtime.   METFORMIN (GLUCOPHAGE) 500 MG TABLET    Take 2  tablets by mouth two times daily   NORTRIPTYLINE (PAMELOR) 25 MG CAPSULE    Take 25 mg by mouth at bedtime.   PANTOPRAZOLE (PROTONIX) 40 MG TABLET    TAKE 1 TABLET BY MOUTH ONCE DAILY AT NOON   RELION SHORT PEN NEEDLES 31G X 8 MM MISC    USE WITH LANTUS SOLOSTAR PEN ONCE DAILY AS DIRECTED   SAXAGLIPTIN HCL (ONGLYZA) 5 MG TABS TABLET    Take 1 tablet (5 mg total) by mouth daily.   SERTRALINE (ZOLOFT) 50 MG TABLET    Take 1 tablet (50 mg total) by mouth at bedtime.   TRAMADOL (ULTRAM) 50 MG TABLET    Take 50 mg by mouth every 6 (six) hours as needed for pain.   Modified Medications   No medications on file  Discontinued Medications   No medications on file

## 2014-05-29 ENCOUNTER — Other Ambulatory Visit: Payer: Self-pay | Admitting: Pulmonary Disease

## 2014-05-29 MED ORDER — LOSARTAN POTASSIUM 50 MG PO TABS: 50.0000 mg | ORAL_TABLET | Freq: Every day | ORAL | Status: DC

## 2014-06-25 ENCOUNTER — Other Ambulatory Visit: Payer: Self-pay | Admitting: Pulmonary Disease

## 2014-07-11 ENCOUNTER — Other Ambulatory Visit: Payer: Self-pay | Admitting: Pulmonary Disease

## 2014-08-06 ENCOUNTER — Institutional Professional Consult (permissible substitution): Payer: Self-pay | Admitting: Neurology

## 2014-08-06 ENCOUNTER — Other Ambulatory Visit: Payer: Self-pay | Admitting: Pulmonary Disease

## 2014-08-07 ENCOUNTER — Encounter: Payer: Self-pay | Admitting: Neurology

## 2014-08-23 ENCOUNTER — Encounter: Payer: Self-pay | Admitting: Pulmonary Disease

## 2014-08-23 ENCOUNTER — Encounter (INDEPENDENT_AMBULATORY_CARE_PROVIDER_SITE_OTHER): Payer: Self-pay

## 2014-08-23 ENCOUNTER — Ambulatory Visit (INDEPENDENT_AMBULATORY_CARE_PROVIDER_SITE_OTHER): Payer: Medicare Other | Admitting: Pulmonary Disease

## 2014-08-23 ENCOUNTER — Other Ambulatory Visit (INDEPENDENT_AMBULATORY_CARE_PROVIDER_SITE_OTHER): Payer: Medicare Other

## 2014-08-23 DIAGNOSIS — M159 Polyosteoarthritis, unspecified: Secondary | ICD-10-CM

## 2014-08-23 DIAGNOSIS — Z8673 Personal history of transient ischemic attack (TIA), and cerebral infarction without residual deficits: Secondary | ICD-10-CM | POA: Diagnosis not present

## 2014-08-23 DIAGNOSIS — I679 Cerebrovascular disease, unspecified: Secondary | ICD-10-CM

## 2014-08-23 DIAGNOSIS — H6123 Impacted cerumen, bilateral: Secondary | ICD-10-CM | POA: Diagnosis not present

## 2014-08-23 DIAGNOSIS — R413 Other amnesia: Secondary | ICD-10-CM

## 2014-08-23 DIAGNOSIS — H905 Unspecified sensorineural hearing loss: Secondary | ICD-10-CM | POA: Diagnosis not present

## 2014-08-23 DIAGNOSIS — H9312 Tinnitus, left ear: Secondary | ICD-10-CM | POA: Diagnosis not present

## 2014-08-23 DIAGNOSIS — E7800 Pure hypercholesterolemia, unspecified: Secondary | ICD-10-CM

## 2014-08-23 DIAGNOSIS — E1165 Type 2 diabetes mellitus with hyperglycemia: Secondary | ICD-10-CM

## 2014-08-23 DIAGNOSIS — M150 Primary generalized (osteo)arthritis: Secondary | ICD-10-CM

## 2014-08-23 DIAGNOSIS — E538 Deficiency of other specified B group vitamins: Secondary | ICD-10-CM

## 2014-08-23 DIAGNOSIS — N289 Disorder of kidney and ureter, unspecified: Secondary | ICD-10-CM | POA: Diagnosis not present

## 2014-08-23 DIAGNOSIS — M545 Low back pain, unspecified: Secondary | ICD-10-CM

## 2014-08-23 DIAGNOSIS — IMO0002 Reserved for concepts with insufficient information to code with codable children: Secondary | ICD-10-CM

## 2014-08-23 DIAGNOSIS — C641 Malignant neoplasm of right kidney, except renal pelvis: Secondary | ICD-10-CM

## 2014-08-23 DIAGNOSIS — E118 Type 2 diabetes mellitus with unspecified complications: Secondary | ICD-10-CM

## 2014-08-23 DIAGNOSIS — Q631 Lobulated, fused and horseshoe kidney: Secondary | ICD-10-CM

## 2014-08-23 LAB — BASIC METABOLIC PANEL
BUN: 25 mg/dL — ABNORMAL HIGH (ref 6–23)
CHLORIDE: 108 meq/L (ref 96–112)
CO2: 29 meq/L (ref 19–32)
CREATININE: 1.27 mg/dL (ref 0.40–1.50)
Calcium: 9.5 mg/dL (ref 8.4–10.5)
GFR: 58.79 mL/min — ABNORMAL LOW (ref >60.00)
GLUCOSE: 130 mg/dL — AB (ref 70–99)
POTASSIUM: 5.2 meq/L — AB (ref 3.5–5.1)
Sodium: 141 mEq/L (ref 135–145)

## 2014-08-23 LAB — LIPID PANEL
CHOL/HDL RATIO: 2
Cholesterol: 113 mg/dL (ref 0–200)
HDL: 48.4 mg/dL (ref >39.00)
LDL Cholesterol: 54 mg/dL (ref 0–99)
NONHDL: 64.6
TRIGLYCERIDES: 51 mg/dL (ref 0.0–149.0)
VLDL: 10.2 mg/dL (ref 0.0–40.0)

## 2014-08-23 LAB — CBC WITH DIFFERENTIAL/PLATELET
BASOS ABS: 0 10*3/uL (ref 0.0–0.1)
Basophils Relative: 0.7 % (ref 0.0–3.0)
Eosinophils Absolute: 0.3 10*3/uL (ref 0.0–0.7)
Eosinophils Relative: 4.7 % (ref 0.0–5.0)
HEMATOCRIT: 36 % — AB (ref 39.0–52.0)
Hemoglobin: 12.4 g/dL — ABNORMAL LOW (ref 13.0–17.0)
LYMPHS ABS: 1.2 10*3/uL (ref 0.7–4.0)
Lymphocytes Relative: 18.2 % (ref 12.0–46.0)
MCHC: 34.5 g/dL (ref 30.0–36.0)
MCV: 90.8 fl (ref 78.0–100.0)
Monocytes Absolute: 0.6 10*3/uL (ref 0.1–1.0)
Monocytes Relative: 9.7 % (ref 3.0–12.0)
Neutro Abs: 4.4 10*3/uL (ref 1.4–7.7)
Neutrophils Relative %: 66.7 % (ref 43.0–77.0)
Platelets: 197 10*3/uL (ref 150.0–400.0)
RBC: 3.97 Mil/uL — ABNORMAL LOW (ref 4.22–5.81)
RDW: 12.8 % (ref 11.5–15.5)
WBC: 6.6 10*3/uL (ref 4.0–10.5)

## 2014-08-23 LAB — VITAMIN B12: Vitamin B-12: 196 pg/mL — ABNORMAL LOW (ref 211–911)

## 2014-08-23 LAB — HEMOGLOBIN A1C: Hgb A1c MFr Bld: 6.8 % — ABNORMAL HIGH (ref 4.6–6.5)

## 2014-08-23 NOTE — Progress Notes (Signed)
Subjective:    Patient ID: Peter Nicholson, male    DOB: 08-11-1940, 74 y.o.   MRN: [id number]  HPI 74 y/o WM here for a follow up visit... he has multiple medical problems as noted below...  ~  SEE PREV EPIC NOTES FOR OLDER DATA >>   ~  February 23, 2013:  30mo ROV & [name]'s CC is LBP & he is followed by DrElsner & DrHarkins for [facility] injections, last ?[id number] & he will call for f/u eval; he has Tramadol for pain... We reviewed the following medical problems during today's office visit >>     OSA> he's been off CPAP x yrs & denies sleep disordered breathing, denies snoring, feels he rests well, & wakes refreshed w/o daytime hypersomnolence...    Borderline HBP> not currently on meds, just diet controlled; BP= 140/86 & reminded to avoid sodium & get weight down; he is essentially asymptomatic w/o CP, palpit, SOB, edema, etc...    Cerebrovasc dis> on ASA81 & Plavix75; known severe extracranial cerebrovasc dis w/ left CAE 2000 by DrLawson & known severe bilat cavernous ICA stenoses; he is also s/p vertebral art angioplasty x2 by DrTDeveshwar w/ known basilar art occlusion;     Chol> on Simva40 + diet; FLP 10/14 showed TChol 136, TG 54, HDL 49, LDL 77... Continue same.    DM> on Lantus10u, Metform500-2Bid, Diabeta5, Onglyza5, off Actos; Labs 10/14 showed BS=252, A1c=9.1 & Lantus added; f/u labs 11/14 showed BS=182, A1c=8.3; continue same, get wt down.    Overweight> weight = 198# and BMI~33; we reviewed diet, exercise, wt reduction strategies...    GI- GERD, divertics> on Protonix40 daily; he had EGD 11/13 showing HH, chr GERD, stricture dil w/ 12F Maloney dilator, erosive gastritis was HPylori neg & treated w/ daily PPI rx.    GU- Hx horseshoe kidney, renal cell cancer, nephrolithiasis> right side of horseshoe kid removed 1994 (RCCa); followed by Urology for yrs & no recurrence; Creat in the 1.2-1.5 range; PSA in the 1-3 range...    DJD, Gout, LBP> on Allopurinol300, Tramadol50, Nortrip ?25mg ; followed by  DrElsner & DrHarkins on ESI injections & Nortrip?dose, last ?[id number] & he will call for f/u eval...    Neuro- c/o memory impairment> on ASA81 & Plavix75, plus MVI etc; he has severe intracranial atherosclerotic dis and chr cerebellar infarcts on prev scans; more recent memory problems felt to be mild cognitive impairment by DrSethi on eval 3/14 (but he is clearly high risk for vascular dementia problems)...     Anxiety> on Xanax0.5 prn...  We reviewed prob list, meds, xrays and labs> see below for updates >> he had the 2014 flu vaccine in Oct... Pt & wife reminded that he needs more reg medical follow up visits.  LABS 10/14 in Epic> REVIEWED.Marland KitchenMarland Kitchen   LABS 11/14:  Chems- OK x BS=182, A1c=8.3, BUN=29, Cr=1.2.Marland Kitchen.   ~  May 24, 2013:  4mo ROV & DM recheck; Meilech has lost 5-6# down to 193# on diet + exercise; he reports home BS checks 120-200 range, feeling well & "splitting wood daily"... He remains on Lantus10, Metform500-2Bid, Diabeta5, Onglyza5;  Labs today showed BS=89, A1c=7.3.Marland KitchenMarland Kitchen  We reviewed prob list, meds, xrays and labs> see below for updates >>   ~  November 21, 2013:  27mo ROV & Elizer continues to feel well, states doing well, no new complaints or concerns; "I feel as well as ever"- still works some as a Art gallery manager, mows 4 yards, and splits wood for the winter... We reviewed the  following medical problems during today's office visit >>     OSA> he's been off CPAP x yrs & denies sleep disordered breathing, denies snoring, feels he rests well, & wakes refreshed w/o daytime hypersomnolence but he takes a nap every day...    Borderline HBP> not currently on meds, just diet controlled; BP= 142/84 & reminded to avoid sodium & get weight down; he is essentially asymptomatic w/o CP, palpit, SOB, edema, etc...    Cerebrovasc dis> on ASA81 & Plavix75; known severe extracranial cerebrovasc dis w/ left CAE 2000 by DrLawson & known severe bilat cavernous ICA stenoses; he is also s/p vertebral art angioplasty x2 by  DrTDeveshwar w/ known basilar art occlusion...     Chol> on Simva40 + diet; FLP 10/14 showed TChol 136, TG 54, HDL 49, LDL 77... Continue same.    DM> on Lantus10u, Metform500-2Bid, Diabeta5, Onglyza5; Labs 8/15 showed BS=167, A1c=7.3.Marland KitchenMarland Kitchen Continue meds and get wt down.    Overweight> weight = 192# and BMI~31; we reviewed diet, exercise, wt reduction strategies...    GI- GERD, divertics> on Protonix40 daily; he had EGD 11/13 showing HH, chr GERD, stricture dil w/ 67F Maloney dilator, erosive gastritis was HPylori neg & treated w/ daily PPI rx.    GU- Hx horseshoe kidney, renal cell cancer, nephrolithiasis> right side of horseshoe kid removed 1994 (RCCa); followed by Urology for yrs & no recurrence; Creat in the 1.2-1.5 range; PSA in the 1-3 range...    DJD, Gout, LBP> on Allopurinol300, Tramadol50, Nortrip ?93m; followed by DrElsner & DrHarkins on ESI injections & Nortrip?dose, last ?[id number]& he will call for f/u eval...    Neuro- c/o memory impairment> on ASA81 & Plavix75, plus MVI etc; he has severe intracranial atherosclerotic dis and chr cerebellar infarcts on prev scans; more recent memory problems felt to be mild cognitive impairment by DrSethi on eval 3/14 (but he is clearly high risk for vascular dementia problems)...     Anxiety> on Xanax0.5 prn...  We reviewed prob list, meds, xrays and labs> see below for updates >>   LABS 8/15:  Chems- ok w/ BS=167, A1c=7.3, Cr=1.2..Marland KitchenMarland Kitchen  ~  April 19, 2014:  561moOV & s/p 2 recent hospitalizations...Marland KitchenMarland Kitchen   1) Adm 11/15- 02/28/14 by [facility] when he presented w/ dysarthria; hx severe vasc dis w/ known severe extracranial cerebrovasc dis w/ left CAE 2000 by DrLawson & known severe bilat cavernous ICA stenoses; he is also s/p vertebral art angioplasty x2 by DrTDeveshwar w/ known basilar art occlusion; he was taking meds on his own & not monitored by family- he had stopped his Plavix (confused, didn't remember), ?DM meds & Simva; Hosp eval included CT Head & MRI/MRA  Brain showing old cerebellar & thalamic infarcts and small vessel dis, acute sm nonhemorrhaghic infarcts throughout the left frontal/ temporal/ & parietal areas; Left ICA is occluded (reconstitution of flow in supraclinoid region appears inadeq), mod stenosis of Right ICA cavernous segment; occluded right vertebral and right PICA; high grade stenosis of Left vertebral art & mod narrowing of prox basilar art- see full report!  Subseq CT Angio Head & Neck showed prev Left CAE markedly attenuated & occluded at skullbase/ petrous segment of the left ICA, diminutive right vertebral that occludes at the skullbase, dominant left vertebral w/ hi-grade stenosis distally, diffuse atherosclerotic changes... 2DEcho was neg for embolic source... He was placed back on ASA/Plavix & no intervention recommended, reminded about need for strict regulation of BP, Chol, DM... Speech improved in hosp &  disch for outpt f/u w/ DrSethi (he has not returned).Marland KitchenMarland Kitchen    2) Adm 12/27- 04/13/14 by [facility] w/ confusion, ataxia, & speech difficulty w/ BS found to be 666 (this is when it was discovered that he was taking his own meds without supervision & several meds hadn't been filled in months); he had an Enterococcus UTI- treated w/ Augmentin; repeat MRI/MRA Brain w/o change from Nov; he was re-evaluated by [facility] for IR- cerebral angiography performed and attempted revasc of the left ICA was attempted but unsuccessful, he was disch home on same meds (ASA/Plavix)- see below- w/ supervision of all meds from wife; Lisinopril was held & they rec keeping BP in the 130-160 range...     We reviewed the following medical problems during today's office visit >>     OSA> he's been off CPAP x yrs & denies sleep disordered breathing, denies snoring, feels he rests well, & wakes refreshed w/o daytime hypersomnolence but he takes a nap every day...    Borderline HBP> not currently on meds, just diet controlled; BP= 132/80 & reminded to avoid sodium & get  weight down; he is essentially asymptomatic w/o CP, palpit, SOB, edema, etc...    Cerebrovasc dis> back on his ASA81 & Plavix75 (he had stopped on his own- now supervised by wife); known severe extracranial cerebrovasc dis w/ left CAE 2000 by [facility] & known severe bilat cavernous ICA stenoses (occluded left); he is also s/p vertebral art angioplasty x2 by Azell Der w/ known basilar art occlusion & severe intracranial atherosclerotic dis; Scans 11/15 revealed acute watershed infarcts in left frontal/ temporal/ parietal lobes & old infarcts in cerebellum & thalamus; DrTDeveshwar attempted intervention to left carotid system but this was reported as unsuccessful (unsucceeful attempt at revascularization of occluded LT ICA cavernous segment, multiple collaterals noted in the LT ICA cav occluded segment)...    Chol> on Lip80 now + diet; FLP 12/15 on Simva40 w/ ?compliance showed TChol 175, TG 105, HDL 41, LDL 113 => changed to Atorva80.    DM> on Lantus15u now, Metform500-2Bid, Diabeta5AM, Onglyza5PM; compliance w/ meds was poor when taking his own meds; Labs 12/15 showed BS=146-665, A1c=9.3 => now all supervised by wife...    Overweight> weight = 181# and BMI~29; we reviewed diet, exercise, wt reduction strategies...    GI- GERD, divertics> on Protonix40 daily; he had EGD 11/13 showing HH, chr GERD, stricture dil w/ 58F Maloney dilator, erosive gastritis was HPylori neg & treated w/ daily PPI rx.    GU- Hx horseshoe kidney, renal cell cancer, nephrolithiasis> right side of horseshoe kid removed 1994 (RCCa); followed by Urology for yrs & no recurrence; Creat in the 1.1-1.5 range; PSA in the 1-3 range...    DJD, Gout, LBP> on Allopurinol300, Tramadol50, Nortrip 74m; followed by DrElsner & DrHarkins on ESI injections & Nortrip rx, last ?June2014 & stable at present, prev mowing, yard work, cBarista etc...    Neuro- memory impairment> on ASA81 & Plavix75, plus MVI etc; he has severe intracranial  atherosclerotic dis, chr cerebellar infarcts on prev scans, & new watershed infarcts on left 11/15; memory problems felt to be mild cognitive impairment by DrSethi on eval 3/14, vascular dementia in light of numerous sm infarcts on scans...     Anxiety> on Xanax0.5 prn & Zoloft50...  We reviewed prob list, meds, xrays and labs> see below for updates >>   XRays, Scans, Angiograpy, Labs from 11-12/20015 all reviewed w/ pt & wife...  ~  May 25, 2014:  165moOV &  here for DM recheck> on Lantus15, Metform500-2Bid, Diabeta5Qam, Onglyza5Qpm; all meds supervised by wife now; they state accuchecks are up & down (55-95 on the low, and 190-350 on the high);  Labs today showed BS=96, A1c improved to 7.6 => we decided to continue same meds & diet, ROV 3 months...  In addition his BP is sl elev 170/80 by nurse and 150/80 by me; wife states that Neuro wanted his BP sl higher due to severe vasc dis & perfusion concerns;  We decided to add LOSARTAN50 for his BP & vasc dis;  Continue other meds the same...   ~  Aug 23, 2014:  38mo ROV & Mykale reports doing well, no complaints or concerns;  Wife is very concerned regarding his memory & behavior changes and she is sched forback surg soon; requesting Neuro 2nd opinion for help w/ these problems... We reviewed the following medical problems during today's office visit >>     HBP> we added Losar50 last OV; BP= 144/76 & reminded to avoid sodium & get weight down; he is essentially asymptomatic w/o CP, palpit, SOB, edema, etc...    Cerebrovasc dis> on his ASA81 & Plavix75 (he had stopped on his own- now supervised by wife); known severe extracranial cerebrovasc dis w/ left CAE 2000 by [facility] & known severe bilat cavernous ICA stenoses (occluded left); he is also s/p vertebral art angioplasty x2 by Azell Der w/ known basilar art occlusion & severe intracranial atherosclerotic dis; Scans 11/15 revealed acute watershed infarcts in left frontal/ temporal/ parietal lobes & old  infarcts in cerebellum & thalamus; DrTDeveshwar attempted intervention to left carotid system but this was reported as unsuccessful (unsucceeful attempt at revascularization of occluded LICA cavernous segment w/ multiple collaterals noted)...    Chol> on Lip80 now + diet; FLP 5/16 showed TChol 113, TG 51, HDL 48, LDL 54 => much improved, continue the same.    DM> on Lantus15u now, Metform500-2Bid, Diabeta5AM, Onglyza5PM; compliance w/ meds was poor but wife supervises now; Labs 5/16 shows BS=130, A1c=6.8 => much improved, continue same.    GU- Hx horseshoe kidney, renal cell cancer, nephrolithiasis> right side of horseshoe kid removed 1994 (RCCa); followed by Urology for yrs & no recurrence; Creat in the 1.1-1.5 range; PSA in the 1-3 range...    Neuro- memory impairment> on ASA81 & Plavix75, plus MVI etc; he has severe intracranial atherosclerotic dis, chr cerebellar infarcts on prev scans, & new watershed infarcts on left 11/15; memory problems felt to be mild cognitive impairment by DrSethi on eval 3/14, vascular dementia in light of numerous sm infarcts on scans; wife wants 2nd opinion Neuro & we will refer...    Anxiety/Depression> on Xanax0.5 prn, Pamelor25,  & Zoloft50...     B12 deficiency> Labs 5/16 showed B12 level = 196 & rec to add 1022mcg VitB12 OTC to his MVI daily... We reviewed prob list, meds, xrays and labs> see below for updates >>   LABS 5/16:  FLP- at goals on Atorva80;  Chems- ok w/ BS=130, A1c=6.8, Cr=1.27;  CBC- ok w/ Hg=12.4;  B12 level= 196...  PLAN>> BP & DM control much improved on current meds w/ wife supervising his meds Rx; FLP looks good on Atorva80; severe vasc dis w/ mult interventions and prob multi-infarct dementia- we will refer to Neuro for 2nd opinion per request; Note Vit B12 is low & we will add 1080mcg B12 to his vitamin regimen...          Problem List:     Hearing Loss >>  he had ENT eval by [facility] 3/15 w/ sensorineural hearing loss & rec to consider hearing  aides...   OBSTRUCTIVE SLEEP APNEA (ICD-327.23) - s/p split night sleep study 11/08- showing an AHI of 19, assoc w/ an O2 sat down to 78%... during the CPAP trial he titrated up to a pressure of 11cm but never ret to REM sleep so optimal pressure is still ???...he uses [facility] for his CPAP needs... ~  12/10: he tells me that he has stopped the CPAP, that he sleeps better without it, not snoring/ rests well, & wakes feeling refreshed... he refuses Sleep Med re-eval etc... ~  11/14: he's been off CPAP x yrs & denies sleep disordered breathing, denies snoring, feels he rests well, & wakes refreshed w/o daytime hypersomnolence, he naps daily... ~  2015> he reports stable & denies sleep disordered breathing...  Hx of HYPERTENSION >>   ~  not currently on medications =>  ~  6/12: BP= 136/80 off meds & denies HA, visual changes, CP, palipit, dizziness, syncope, dyspnea, edema, etc... he states that home BP checks are "OK". ~  CXR 11/12 showed borderline heart size, tort Ao, clear lungs w/ ?underlying COPD, NAD...  ~  1/13: he was sent home from the [place] on Lisinopril & Lasix; both stopped 1/13 by TP w/ renal insuffic & elev K+; subseq improved & BP stable... ~  2/13: BP= 112/68 & feeling weak etc...  3/13:  BP= 140/72 & feeling some better... ~  6/13:  BP= 138/78 & feeling much better; denies CP, palpit, ch in SOB or edema... ~  10/13:  BP= 140/82 & he is c/o reflux & dysphagia today, no CP/ palpit/ SOB/ edema... ~  11/14: not currently on meds, just diet controlled; BP= 140/86 & reminded to avoid sodium & get weight down; he is essentially asymptomatic w/o CP, palpit, SOB, edema, etc. ~  2/15: still not on meds, just diet controlled; BP= 124/76 & he remains asymptomatic...  ~  8/15: not currently on meds, just diet controlled; BP= 142/84 & reminded to avoid sodium & get weight down; he is essentially asymptomatic w/o CP, palpit, SOB, edema, etc ~  1/16: still not on meds, just  diet controlled; BP= 132/80 & Neuro has rec BP range of 130-160 to aid perfusion due to his severe cerebrovasc dis... ~  2/16: BP 170/80 => recheck 150/80 range; we decided to add Losartan $RemoveBefo'50mg'SBeIuFPwyLT$ /d for BP & vasc dis... ~  5/16: on Losartan50; BP= 144/76 & he says he's asymptomatic, continue same...  CEREBROVASCULAR DISEASE (ICD-437.9) - stable on ASA $Remo'325mg'SYvOs$ /d & PLAVIX $Remove'75mg'MMtgOJb$ /d per DrDeveshwar/ DrLawson/ DrReynolds... he has severe extracranial cerebrovasc disease- esp in the post circulation, and he is s/p vertebral art angioplasty x 2, and w/ a known basilar art occlusion- prev on Coumadin controlled by Neurology (switched to ASA/ Plavix in 2010)... also followed by Sheryn Bison and DrDeveshwar for IR... he had a left CAE in 6/00 by [facility]... ~  CDopp[place] 2/07 showed 0-39% bilat ICA stenoses, s/p left CAE w/ DPA... ~  eval 3/10 by Sheryn Bison w/ CDopplers that showed similar patency w/ 0-39% bilat ICA stenoses; due to the 10 yr interval DrLawson did not feel he needed any additional follow up. ~  12/10: continued f/u DrDevershwar IR w/ MRI/ MRAs showing bilat carotid siphon region atherosclerosis & narrowing (unchanged), and markedly diseased distal left vertebral & prox basilar art narrowing (no change)... ~  11/12: CTA head & neck showed similar severe dis w/  mod to severe bilat cavernous ICA stenoses due to extensive atherosclerosis; severe distal vertebral atherosclerosis- distal right occluded, distal left w/ high grade stenosis; mod to severe bilat PCA stenoses; chr cerebellar infarcts, no acute infarcts. ~  11/14: on ASA81 & Plavix75; known severe extracranial cerebrovasc dis w/ left CAE 2000 by DrLawson & known severe bilat cavernous ICA stenoses; he is also s/p vertebral art angioplasty x2 by DrTDeveshwar w/ known basilar art occlusion ~  8/15: on ASA81 & Plavix75; known severe extracranial cerebrovasc dis w/ left CAE 2000 by DrLawson & known severe bilat cavernous ICA stenoses; he is also s/p vertebral art  angioplasty x2 by DrTDeveshwar w/ known basilar art occlusion, he endorses medication compliance & denies cerebral ischemic symptoms... ~  11/15: Hosp by [facility] when he presented w/ dysarthria; hx severe vasc dis w/ known severe extracranial cerebrovasc dis w/ left CAE 2000 by [facility] & known severe bilat cavernous ICA stenoses; he is also s/p vertebral art angioplasty x2 by DrTDeveshwar w/ known basilar art occlusion; he was taking meds on his own & not monitored by family- he had stopped his Plavix (confused, didn't remember), ?DM meds & Simva; Hosp eval included CT Head & MRI/MRA Brain showing old cerebellar & thalamic infarcts and small vessel dis, acute sm nonhemorrhaghic infarcts throughout the left frontal/ temporal/ & parietal areas; Left ICA is occluded (reconstitution of flow in supraclinoid region appears inadeq), mod stenosis of Right ICA cavernous segment; occluded right vertebral and right PICA; high grade stenosis of Left vertebral art & mod narrowing of prox basilar art- see full report!  Subseq CT Angio Head & Neck showed prev Left CAE markedly attenuated & occluded at skullbase/ petrous segment of the left ICA, diminutive right vertebral that occludes at the skullbase, dominant left vertebral w/ hi-grade stenosis distally, diffuse atherosclerotic changes... 2DEcho was neg for embolic source... He was placed back on ASA/Plavix & no intervention recommended, reminded about need for strict regulation of BP, Chol, DM... Speech improved in [place] for outpt f/u w/ DrSethi (he did not return)... ~  12/15: Hosp by [facility] w/ confusion, ataxia, & speech difficulty w/ BS found to be 666 (this is when it was discovered that he was taking his own meds without supervision & several meds hadn't been filled in months); he had an Enterococcus UTI- treated w/ Augmentin; repeat MRI/MRA Brain w/o change from Nov; he was re-evaluated by [facility] for IR- cerebral angiography performed and attempted revasc of  the left ICA was attempted but unsuccessful, he was disch home on same meds (ASA/Plavix) w/ supervision of all meds from wife; Lisinopril was held & they rec keeping BP in the 130-160 range...  ~  5/16: he has severe vascular dis & wife is concerned about memory loss & behavioral changes- continue ASA/ Plavix & she requests Neuro 2nd opinion...  VENOUS INSUFFICIENCY (ICD-459.81) - he knows to avoid sodium, elevate legs, wear support hose...  HYPERCHOLESTEROLEMIA (ICD-272.0) - on CRESTOR $RemoveBe'10mg'rvBcQeMMy$ /d since [facility] 12/12... ~  [place] 6/08 showed TChol 153, TG 120, HDL 36, LDL 93 ~  FLP 8/09 showed TChol 131, TG 84, HDL 38, LDL 76 ~  2010:  pt was not fasting for blood work... rec to continue Crestor10 + diet efforts. ~  FLP 10/11 showed TChol 210, TG 161, HDL 40, LDL 149... rec to start Crestor $RemoveBefor'20mg'ucBcUBMsrMlA$ /d, he will decide. ~  FLP 6/12 on Cres20 showed TChol 106, TG 95, HDL 41, LDL 46... Continue same. ~  [place] 12/12 in [place] (on  Cres20 PTA) showed TChol 100, TG 101, HDL 37, LDL 43... They decr to Cres10. ~  FLP 3/13 on Cres10 showed TChol 118, TG 67, HDL 44, LDL 61... Continue same, later switched to Simva40 for $$ reasons. ~  FLP 10/14 on Simva40 showed TChol 136, TG 54, HDL 49, LDL 77... Continue same. ~  FLP 12/15 on Simva40 w/ ?compliance showed TChol 175, TG 105, HDL 41, LDL 113 => changed to Why w/ supervision of all meds by wife. ~  FLP 5/16 on Atorva80 showed TChol 113, TG 51, HDL 48, LDL 54 => much improved, continue the same.  DIABETES MELLITUS (ICD-250.00) >>  ~  on METFORMIN 500mg - 2Bid, GLYBURIDE 5mg Bid, ACTOS 30mg /d, & ONGLYZA 5mg /d... ~  labs 6/08 showed BS= 155, HgA1c= 8.1.Marland Kitchen. (glyburide incr to 5Bid). ~  labs 11/08 showed BS= 352, HgA1c= 8.1.... (Actos added). ~  labs 8/09 showed BS= 128, HgA1c= 7.1.Marland KitchenMarland Kitchen rec- same meds, better diet, get wt down! ~  labs 2/10 showed BS= 249, A1c= 7.3.... Metform incr to 2Bid. ~  labs 12/10 (nonfasting- on all 3 meds) showed BS= 87, A1c= 6.9 ~  labs 10/11 on  Metform1000Bid+Gybur5Bid showed BS= 180, A1c= 8.4.Marland KitchenMarland Kitchen rec to incr GLYBUR10Bid + diet. ~  labs 6/12 on Metform1000Bid+Glybur10Bid showed BS= 196, A1c= 11.2 ?what happened?> he declined insulin & Endocrine consult, opted for max oral meds by adding ACTOS 30mg /d & ONGLYZA 5mg /d... ~  Labs 12/12 in [place] showed BS 150-200+ and A1c= 7.2; covered w/ SSI in hosp & placed back on 4 oral meds on disch from rehab... ~  Labs 1/13 on all 4 meds showed BS= 230, A1c=7.1; we reviewed diet & he will start PT soon... ~  Labs 3/13 on 4meds showed BS= 72, A1c= 6.6 ~  4/13: pt called w/ hypoglycemia & we decreased Glyburide 5mg  from 2Bid to 1Bid... ~  6/13:  Pt on Metform500-2Bid, Glybur5mg Bid, Actos30, Onglyza5; BS at home 70-90 he says & still drinking Boost; Labs showed BS=190  A1c=7.4  ; we decided to cut the Glyburide5mg  Qam only but keep the Metform500-2Bid +Actos +Onglyza... ~  10/13: on his 74meds> BS=171, A1c=7.7 but he's gained 16#; told to STOP the boost, get on diet, get wt down, take all meds every day... ~  10/14: seen by TP> Labs 10/14 showed BS=252, A1c=9.1 & Lantus added to his Metform500-2Bid, Diabeta5, Onglyza5; Lantus10u added... ~  11/14: on Lantus10u, Metform500-2Bid, Diabeta5, Onglyza5, off Actos; f/u labs 11/14 showed BS=182, A1c=8.3; continue same, get wt down. ~  8/15: on Lantus10u, Metform500-2Bid, Diabeta5, Onglyza5; Labs 8/15 showed BS=167, A1c=7.3.Marland KitchenMarland Kitchen Continue meds and get wt down. ~  12/15: now on Lantus15u now, Metform500-2Bid, Diabeta5AM, Onglyza5PM; compliance w/ meds was poor when taking his own meds; Labs 12/15 showed BS=146-665, A1c=9.3 => now all supervised by wife. ~  2/16: on Lantus15u now, Metform500-2Bid, Diabeta5AM, Onglyza5PM; Labs 2/16 showed BS=96, A1c=7.6, therefore continue same meds & diet... ~  5/16: on Lantus15u, Metform500-2Bid, Diabeta5AM, Onglyza5PM; compliance w/ meds was poor but wife supervises now; Labs 5/16 shows BS=130, A1c=6.8 => much improved, continue  same.  OVERWEIGHT (ICD-278.02) - he states that he is not on much of a diet & we reviewed low carb/ no sweets/ low fat diet. ~  weight 8/09 = 216# ~  weight 2/10 = 216# ~  weight 12/10 = 212# ~  weight 10/11 = 210# ~  Weight 6/12 = 206# and he admits to not being on much of a diet... ~  Weight 2/13 = non weight bearing  still... ~  Weight 3/13 = 193# ~  Weight 6/13 = 191# ~  Weight 10/13 = 206# ~  Weight 11/14 = 198# ~  Weight 2/15 = 193# ~  Weight 1/16 = 181# ~  Weight 5/16 = 189#  GERD (ICD-530.81) - prev on Nexium40, he switched to OTC Prilosec, now [place] 40mg /d due to Plavix Rx; pt had stopped this on his own & asked to restart 10/13. ~  EGD 19/03 showed [place], GERD & esoph stricture dilated... ~  EGD 12/09 by DrPatterson showed HH & severe erosive esophagitis from GERD... ~  10/13: presents w/ incr reflux- w/ dysphagia & food sticking in mid-esoph that occurs every 2-3days & has to vomit to feel better; he had stopped his PPI on his own (?why?), rec to restart the Protonix40 & we will refer to GI for EGD & dilatation... ~  10/13: he saw DrPatterson> EGD 11/13 showed HH, chr GERD, stricture dil w/ 27F Maloney dilator, erosive gastritis was HPylori neg & treated w/ daily PPI rx...  ~  He remains on Protonix40/d & doing satis w/o reflux symptoms...  DIVERTICULOSIS OF COLON (ICD-562.10) - note: he was hosp in [place] briefly 12/09 for fecal impaction; prev on [place] but recently noted loose stools on the Metformin 1000mg Bid... ~  colonoscopy 10/03 by DrPatterson showed divertics only... ~  colonoscopy 12/09 by DrPatterson was also normal x for divertics... ~  He denies n/v, c/d, blood seen...  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 Hx of RENAL CELL CANCER (ICD-189.0) - he had a partial nephrectomy of the right side of his horseshoe kidney in 1994 due to renal cell ca... still followed by [facility] yearly by his hx but we  don't have any recent notes... ~  labs 10/11 showed PSA= 0.95 ~  Labs 3/13 showed BUN=21, Creat=1.1, PSA=2.75 ~  Labs 10/13 showed BUN= 25, Creat= 1.2 ~  Labs 10/14 showed BUN=38, Cr=1.5 (w/ enterococcus UTI);  Labs 11/14 showed BUN=29, Cr=1.2 ~  Labs 8/15 showed BUN= 24, Cr= 1.2 ~  Labs 12/15 showed Cr= 1.1-1.4 ~  Labs 2/16 showed BUN= 18, Cr= 1.12  Hx of NEPHROLITHIASIS (ICD-592.0) - remote hx of kidney stones followed by DrPeterson...  DEGENERATIVE JOINT DISEASE (ICD-715.90) &  GOUT (ICD-274.9) - on ALLOPURINOL 300mg /d... ~  labs 10/11 showed URIC= 4.7 ~  He was [facility] 11/26 - 03/27/11 after fall from ladder cleaning gutters w/ complex right tibial plat fx- ORIF, 2 operations by DrHardy, & hosp course complic by ileus, TIA, electrolyte abn, etc;  Event disch to Rehab & he was attended by [facility]...  BACK PAIN, LUMBAR (ICD-724.2) - known degenerative spondylitic disease at L4-5 and L5-S1 prev followed by [facility] (now DrElsner & DrHarkins)... ~  6/12: he reports further back pain w/ eval by Chiropractor showing L4 disc prob & regular adjustments; but now notes incr pain & he requests referral to Neurosurg for further eval ==> seen by DrElsner, then DrHarkins for shots which has helped... ~  2014: he has continued f/u w/ DrHarkins/ [facility] & received periodic ESI injections for his Lumbar DDD; they added NORTRIPTYLINE 25mg Qhs & slowly incr the dose  NEURO >> Hx Left Hemispheric TIA in 11/12 & MEMORY IMPAIRMENT ~  3/14:  He had Neuro eval DrSethi> he did Labs, EEG, MRI Brain, & MRA Brain & Neck; Rec to take 2 FishOil caps daily, CerefolinNAC daily, work puzzles etc, continue Plavix & strict control of  BP, Chol, DM; they did not start Aricept... ~  11/15 & 12/15> see above> Hosp w/ wastershed strokes left frontal/ temporal/ parietal areas when he wasn't taking his Plavix; now all meds supervised by wife; he had MRIs, CT Angio, and Arteriograms w/ attempted intervention on left by  DrTDeveshwar- unsuccessful & rec for continued ASA/ Plavix, & control on BP, Chol, DM w/ BP in the 130-160 range... 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  ANXIETY (ICD-300.00) - uses Zolloft50 & ALPRAZOLAM 0.$RemoveBefore'5mg'[id number]$  as needed.  B12 DEFICIENCY >>  ~  5/16:  Labs showed B12 = 196 & rec to start 1024mcg by mouth daily & we will f/i labs...   Past Surgical History  Procedure Laterality Date  . Incisional hernia repair  1/96    Dr.Leone  . Nasal sinus surgery  12/96    Dr.Redman  . Cholecystectomy  9/04    Dr. Rebekah Chesterfield  . 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 Carotid endarterectomy  09/2000    Dr.Lawson  . Veterbral art angioplasty      x2  . Small intestine surgery    . External fixation leg  03/10/2011    Procedure: EXTERNAL FIXATION LEG;  Surgeon: Rozanna Box;  Location: [place];  Service: Orthopedics;  Laterality: Right;  . Orif tibia plateau  03/24/2011    Procedure: OPEN REDUCTION INTERNAL FIXATION (ORIF) TIBIAL PLATEAU;  Surgeon: Rozanna Box;  Location: [place];  Service: Orthopedics;  Laterality: Right;  . Radiology with anesthesia N/A 04/12/2014    Procedure: ANGIOPLASTY;  Surgeon: Rob Hickman, MD;  Location: [place];  Service: Radiology;  Laterality: N/A;    Outpatient Encounter Prescriptions as of 08/23/2014  Medication Sig  . allopurinol (ZYLOPRIM) 300 MG tablet TAKE 1 TABLET (300 MG TOTAL) BY MOUTH DAILY.  Marland Kitchen ALPRAZolam (XANAX) 0.5 MG tablet TAKE 1/2 TO 1 TABLET BY MOUTH THREE TIMES DAILY AS NEEDED FOR NERVES  . aspirin EC 81 MG EC tablet Take 1 tablet (81 mg total) by mouth daily.  Marland Kitchen atorvastatin (LIPITOR) 80 MG tablet TAKE 1 TABLET BY MOUTH EVERY DAY AT 6PM  . Blood Glucose Monitoring Suppl (ONE TOUCH ULTRA SYSTEM KIT) W/DEVICE KIT Test blood sugar up to 3 times daily  . clopidogrel (PLAVIX) 75 MG tablet Take 1 tablet (75 mg total) by  mouth daily.  Marland Kitchen glucose blood test strip Test blood sugar up to 3 times daily  . glyBURIDE (DIABETA) 5 MG tablet TAKE ONE TABLET BY MOUTH EVERY MORNING WITH BREAKFAST  . insulin glargine (LANTUS) 100 unit/mL SOPN Inject 0.15 mLs (15 Units total) into the skin at bedtime.  Marland Kitchen losartan (COZAAR) 50 MG tablet Take 1 tablet (50 mg total) by mouth daily.  . metFORMIN (GLUCOPHAGE) 500 MG tablet Take 2 tablets by mouth two times daily  . nortriptyline (PAMELOR) 25 MG capsule Take 25 mg by mouth at bedtime.  . pantoprazole (PROTONIX) 40 MG tablet Take 1 tablet (40 mg total) by mouth daily.  Marland Kitchen RELION SHORT PEN NEEDLES 31G X 8 MM MISC USE WITH LANTUS SOLOSTAR PEN ONCE DAILY AS DIRECTED  . saxagliptin HCl (ONGLYZA) 5 MG TABS tablet Take 1 tablet (5 mg total) by mouth daily.  . sertraline (ZOLOFT) 50 MG tablet Take 1 tablet (50 mg total) by mouth at bedtime.  . traMADol (ULTRAM) 50 MG tablet Take 50  mg by mouth every 6 (six) hours as needed for pain.    No facility-administered encounter medications on file as of 08/23/2014.    Allergies  Allergen Reactions  . Dilaudid [Hydromorphone Hcl] Other (See Comments)    hallucinations  . Fentanyl Other (See Comments)    hallucinations    Current Medications, Allergies, Past Medical History, Past Surgical History, Family History, and Social History were reviewed in [employer] record.    Review of Systems        See HPI - all other systems neg except as noted...  The patient complains of dyspnea on exertion.  The patient denies anorexia, fever, weight loss, weight gain, vision loss, decreased hearing, hoarseness, chest pain, syncope, peripheral edema, prolonged cough, headaches, hemoptysis, abdominal pain, melena, hematochezia, severe indigestion/heartburn, hematuria, incontinence, muscle weakness, suspicious skin lesions, transient blindness, difficulty walking, depression, unusual weight change, abnormal bleeding, enlarged lymph nodes,  and angioedema.     Objective:   Physical Exam     WD, WN, 74 y/o WM in NAD...  GENERAL:  Alert & oriented; pleasant & cooperative... HEENT:  [place]/AT, EOM-wnl, PERRLA, EACs-clear, TMs-wnl, NOSE-clear, THROAT-clear & wnl. NECK:  Supple w/ fairROM; no JVD; s/p left CAE, +bruits; no thyromegaly or nodules palpated; no lymphadenopathy. CHEST:  Clear to P & A; without wheezes/ rales/ or rhonchi heard... HEART:  Regular Rhythm; gr 1/6 SEM without rubs or gallops detected... ABDOMEN:  Soft & nontender; normal bowel sounds; no organomegaly or masses palpated... EXT: right knee deformity s/p ORIF, mod arthritic changes; no varicose veins/ +venous insuffic/ 1+ edema. NEURO:  CN's intact; no focal neuro deficits... DERM:  No lesions noted; no rash etc...  RADIOLOGY DATA:  Reviewed in the EPIC EMR & discussed w/ the patient...  LABORATORY DATA:  Reviewed in the EPIC EMR & discussed w/ the patient...   Assessment & Plan:    BP>  Neuro has wanted his BP to be sl higher than usual for perfusion reasons but BP= 170/80 on arrival & improved to 150/80 w/ rest; we opted to start Losartan $RemoveBefore'50mg'awIwcanUXHrkr$ /d for BP & vasc dis... 5/16> BP= 144/76 today, continue Losar50...   CEREBROVASC Disease & STROKES 11/15 & TIA 12/12>  Known severe cerebrovasc dis & he had TIA while in hosp for fx leg 2012 (he was off plavix at the time); Recurrent prob 11/15 w/ watershed infarcts in left frontal/ temporal/ parietal regions while he was off Plavix (he was taking meds on his own & hadn't been filling the Plavix); s/p extensive eval w/ MRI/MRA, CT Angio & Arteriogram by Ladene Artist w/ attempt at intervention on left unsuccessful; wife now supervises all meds... He has prob multi-infarct dementia & wife requests Neuro 2nd opinion - we will refer...  NEURO> Mild Cognitive Impairment per DrSethi & wife is concerned re: memory/ vascular dementia; we will arrange for Neuro 2nd opinion...  CHOL>  on Atrova80 now & f/u FLP much  improved...  DM>  On Lantus15, Metform, Diabeta, Onglyza & compliance is better w/ wife supervision; Labs 5/16 showed BS=130, A1c=6.8, continue same...  Overweight>  We discussed low carb, low fat wt reducing diet...  GI>  GERD prev controlled w/ Protonix but he stopped on his own (?why?); EGD 11/13 w/ dilatation & reminded to take the Protonix40 every day...  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  DJD/ Hx Gout/ LBP>  He was seeing chiropractor regularly regarding LBP & then f/u w/  DrElsner,Neurosurg & had ESI by DrHarkins & the shots have helped...  s/p fall w/ right Tibial plateaux fx> 1st ext fixation, 2nd ORIF>  By DrHardy, Ortho; exercise is difficult but very much needed; he's had extensive PT training...  Anxiety>  He remains on Alpraz Prn... Also has [place] on his list of meds...  B12 deficiency>  Labs 5/16 w/ B12 level = 196; rec to start oral B12 supplement ~1080mcg/d & we will repeat level in follow up...   Patient's Medications  New Prescriptions   No medications on file  Previous Medications   ALLOPURINOL (ZYLOPRIM) 300 MG TABLET    TAKE 1 TABLET (300 MG TOTAL) BY MOUTH DAILY.   ALPRAZOLAM (XANAX) 0.5 MG TABLET    TAKE 1/2 TO 1 TABLET BY MOUTH THREE TIMES DAILY AS NEEDED FOR NERVES   ASPIRIN EC 81 MG EC TABLET    Take 1 tablet (81 mg total) by mouth daily.   ATORVASTATIN (LIPITOR) 80 MG TABLET    TAKE 1 TABLET BY MOUTH EVERY DAY AT 6PM   BLOOD GLUCOSE MONITORING SUPPL (ONE TOUCH ULTRA SYSTEM KIT) W/DEVICE KIT    Test blood sugar up to 3 times daily   CLOPIDOGREL (PLAVIX) 75 MG TABLET    Take 1 tablet (75 mg total) by mouth daily.   GLUCOSE BLOOD TEST STRIP    Test blood sugar up to 3 times daily   GLYBURIDE (DIABETA) 5 MG TABLET    TAKE ONE TABLET BY MOUTH EVERY MORNING WITH BREAKFAST   INSULIN GLARGINE (LANTUS) 100 UNIT/ML SOPN    Inject 0.15 mLs (15 Units total) into the skin at bedtime.   LOSARTAN  (COZAAR) 50 MG TABLET    Take 1 tablet (50 mg total) by mouth daily.   METFORMIN (GLUCOPHAGE) 500 MG TABLET    Take 2 tablets by mouth two times daily   NORTRIPTYLINE (PAMELOR) 25 MG CAPSULE    Take 25 mg by mouth at bedtime.   PANTOPRAZOLE (PROTONIX) 40 MG TABLET    Take 1 tablet (40 mg total) by mouth daily.   RELION SHORT PEN NEEDLES 31G X 8 MM MISC    USE WITH LANTUS SOLOSTAR PEN ONCE DAILY AS DIRECTED   SAXAGLIPTIN HCL (ONGLYZA) 5 MG TABS TABLET    Take 1 tablet (5 mg total) by mouth daily.   SERTRALINE (ZOLOFT) 50 MG TABLET    Take 1 tablet (50 mg total) by mouth at bedtime.   TRAMADOL (ULTRAM) 50 MG TABLET    Take 50 mg by mouth every 6 (six) hours as needed for pain.   Modified Medications   No medications on file  Discontinued Medications   No medications on file

## 2014-08-23 NOTE — Patient Instructions (Signed)
Today we updated your med list in our EPIC system...    Continue your current medications the same...  Today we rechecked your FASTING (almost) blood work...    We will contact you w/ the results when available...   We will arrange for a Neurology consult w/ [facility]...  Call for any questions...  Let's plan a follow up visit in 29mo, sooner if needed for problems.Marland KitchenMarland Kitchen

## 2014-09-05 ENCOUNTER — Telehealth: Payer: Self-pay | Admitting: Pulmonary Disease

## 2014-09-05 NOTE — Telephone Encounter (Signed)
Patient's wife says that patient needs to be seen sooner than September, his memory is getting worse, she is worried that he may be having mini-strokes.  No slurring of speech, no headaches, no stroke-like symptoms identified.  She said that her main concern is that he is forgetting how to do things.  She said that even if he has to see Leonie Man again that would be fine as long as he can get in sooner than September.  Dr. Lenna Gilford, please advise.

## 2014-09-06 NOTE — Telephone Encounter (Signed)
Spoke with office's [facility] and was informed that appt had been changed from sept 2016 to 10-18-14. Pt's wife called and informed of changed. Informed wife to call neurologist office and see if her husband could be placed on cancellation list. Pt's wife stated she would. Nothing further is needed.

## 2014-09-06 NOTE — Telephone Encounter (Signed)
Peter Nicholson, Please advise if SN has responded to this message. Thanks.

## 2014-09-13 ENCOUNTER — Other Ambulatory Visit: Payer: Self-pay | Admitting: Pulmonary Disease

## 2014-09-24 ENCOUNTER — Ambulatory Visit (INDEPENDENT_AMBULATORY_CARE_PROVIDER_SITE_OTHER): Payer: Medicare Other | Admitting: Neurology

## 2014-09-24 ENCOUNTER — Other Ambulatory Visit (INDEPENDENT_AMBULATORY_CARE_PROVIDER_SITE_OTHER): Payer: Medicare Other

## 2014-09-24 ENCOUNTER — Encounter: Payer: Self-pay | Admitting: Neurology

## 2014-09-24 VITALS — BP 138/66 | HR 88 | Resp 16 | Ht 66.0 in | Wt 189.0 lb

## 2014-09-24 DIAGNOSIS — G3184 Mild cognitive impairment, so stated: Secondary | ICD-10-CM

## 2014-09-24 DIAGNOSIS — E538 Deficiency of other specified B group vitamins: Secondary | ICD-10-CM | POA: Diagnosis not present

## 2014-09-24 LAB — TSH: TSH: 2.81 u[IU]/mL (ref 0.35–4.50)

## 2014-09-24 MED ORDER — DONEPEZIL HCL 10 MG PO TABS: ORAL_TABLET | ORAL | Status: DC

## 2014-09-24 NOTE — Progress Notes (Signed)
NEUROLOGY CONSULTATION NOTE  HERBERT MARKEN MRN: [id number] DOB: Jul 06, 1940  Referring provider: Dr. Teressa Lower Primary care provider: Dr. Teressa Lower  Reason for consult:  Memory loss  Dear Dr Lenna Gilford:  Thank you for your kind referral of JAMICHEAL HEARD for consultation of the above symptoms. Although his history is well known to you, please allow me to reiterate it for the purpose of our medical record. The patient was accompanied to the clinic by his wife who also provides collateral information. Records and images were personally reviewed where available.  HISTORY OF PRESENT ILLNESS: This is a pleasant 74 year old right-handed man with vascular risk factors including hypertension, diabetes, hyperlipidemia, peripheral vascular disease, TIA in 2012, and left MCA watershed infarcts secondary to left ICA stenosis, presenting for evaluation of worsening memory. He had previously seen neurologist Dr. Leonie Man in 2014 with his wife reporting mild memory difficulties since around 2012. He had mostly short-term memory difficulties, needing to be told several times and not remembering. His MMSE at that time was 27/30. His wife reports that after his strokes in November and December 2015, memory has been much worse. Hospital records were reviewed, in November he presented with aphasia, MRI showed left frontal and parietal watershed infarcts. His carotid angiogram showed severe preocclusive stenosis of the left proximal ICA, 80-85% stenosis of the right ICA proximal cavernous segment, 80% stenosis of the dominant left vertebral basilar junction just proximal to the basilar artery. He was discharged on aspirin and Plavix. He returned to the ER a month later with confusion, anomia, and ataxia. MRI showed multiple ischemic infarcts in the left frontal and parietal lobes, as well as the posterior left temporal lobe at the temporoparietal junction, cerebral angiogram showed interval progression of stenosis of left  ICA distal to origin of ophthalmic artery with severe near complete occlusion of proximal cavernous left ICA, 70% stenosis of proximal cavernous right ICA, and 60% stenosis of dominant left vertebral artery at origin. There was unsuccessful attempt at revascularization of occluded left ICA cavernous segment, and note of multiple collaterals in the left ICA cavernous occluded segment. He was discharged home with SBP goal between 130 to 160 and control of vascular risk factors.   He feels his memory is "average, there are times I cannot remember." His wife reports he is not remembering things he used to know to do, such as how to fix things around the house and which are the right tools to use. He has had word-finding difficulties since the stroke, his wife notices this once in a while. He forgets to take his medications, his wife now sets this out for him. He denies getting lost driving, his wife is concerned "somewhat," he occasionally cannot recall the way to go somewhere. He occasionally gets mad and frustrated, but no paranoia. He denies any alcohol or tobacco use, no significant head injuries. His mother had memory changes in her 39s.   He denies any headaches, dizziness, diplopia, dysarthria, dysphagia, neck/back pain, focal numbness/tingling/weakness, bowel/bladder dysfunction. No anosmia or tremors.    PAST MEDICAL HISTORY: Past Medical History  Diagnosis Date  . Obstructive sleep apnea (adult) (pediatric)     doesnt wear CPAP  . HTN (hypertension)   . Cerebrovascular disease, unspecified   . Unspecified venous (peripheral) insufficiency   . Pure hypercholesterolemia   . Type II or unspecified type diabetes mellitus without mention of complication, not stated as uncontrolled   . Overweight(278.02)   . Diaphragmatic hernia without mention  of obstruction or gangrene   . Esophageal reflux   . Diverticulosis of colon (without mention of hemorrhage)   . Other specified congenital anomaly of  kidney   . Kidney carcinoma   . Calculus of kidney   . Gout, unspecified   . Lumbago   . Anxiety state, unspecified   . History of gallstones   . IBS (irritable bowel syndrome)   . History of seizures   . Bowel obstruction   . Osteoarthrosis, unspecified whether generalized or localized, unspecified site     tendonitis  . Blood transfusion without reported diagnosis   . Stroke     2001  . Seizures     last seizure November 2012    PAST SURGICAL HISTORY: Past Surgical History  Procedure Laterality Date  . Incisional hernia repair  1/96    Dr.Leone  . Nasal sinus surgery  12/96    Dr.Redman  . Cholecystectomy  9/04    Dr. Rebekah Chesterfield  . 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 Carotid endarterectomy  09/2000    Dr.Lawson  . Veterbral art angioplasty      x2  . Small intestine surgery    . External fixation leg  03/10/2011    Procedure: EXTERNAL FIXATION LEG;  Surgeon: Rozanna Box;  Location: [facility];  Service: Orthopedics;  Laterality: Right;  . Orif tibia plateau  03/24/2011    Procedure: OPEN REDUCTION INTERNAL FIXATION (ORIF) TIBIAL PLATEAU;  Surgeon: Rozanna Box;  Location: [facility];  Service: Orthopedics;  Laterality: Right;  . Radiology with anesthesia N/A 04/12/2014    Procedure: ANGIOPLASTY;  Surgeon: Rob Hickman, MD;  Location: [facility];  Service: Radiology;  Laterality: N/A;    MEDICATIONS: Current Outpatient Prescriptions on File Prior to Visit  Medication Sig Dispense Refill  . allopurinol (ZYLOPRIM) 300 MG tablet TAKE 1 TABLET (300 MG TOTAL) BY MOUTH DAILY. 90 tablet 1  . ALPRAZolam (XANAX) 0.5 MG tablet TAKE 1/2 TO 1 TABLET BY MOUTH THREE TIMES DAILY AS NEEDED FOR NERVES 90 tablet 5  . aspirin EC 81 MG EC tablet Take 1 tablet (81 mg total) by mouth daily. 30 tablet 0  . atorvastatin (LIPITOR) 80 MG tablet TAKE 1 TABLET BY MOUTH EVERY DAY AT 6PM 30 tablet 2  . clopidogrel (PLAVIX) 75 MG tablet Take 1 tablet (75 mg total) by  mouth daily. 90 tablet 3  . glyBURIDE (DIABETA) 5 MG tablet TAKE ONE TABLET BY MOUTH EVERY MORNING WITH BREAKFAST 90 tablet 1  . LANTUS SOLOSTAR 100 UNIT/ML Solostar Pen INJECT 10 UNITS INTO THE SKIN AT BEDTIME. (Patient taking differently: 15 units daily) 15 mL 1  . losartan (COZAAR) 50 MG tablet Take 1 tablet (50 mg total) by mouth daily. 90 tablet 1  . metFORMIN (GLUCOPHAGE) 500 MG tablet Take 2 tablets by mouth two times daily 360 tablet 3  . nortriptyline (PAMELOR) 25 MG capsule Take 25 mg by mouth at bedtime.    . pantoprazole (PROTONIX) 40 MG tablet Take 1 tablet (40 mg total) by mouth daily. 90 tablet 3  . saxagliptin HCl (ONGLYZA) 5 MG TABS tablet Take 1 tablet (5 mg total) by mouth daily. 90 tablet 3  . sertraline (ZOLOFT) 50 MG tablet Take 1 tablet (50 mg total) by mouth at bedtime. 90 tablet 2   No current facility-administered medications on file prior to visit.    ALLERGIES: Allergies  Allergen Reactions  . Dilaudid [Hydromorphone Hcl]  Other (See Comments)    hallucinations  . Fentanyl Other (See Comments)    hallucinations    FAMILY HISTORY: Family History  Problem Relation Age of Onset  . Heart attack Father   . Dementia Mother   . Ovarian cancer Paternal Grandmother   . Breast cancer Sister   . Colon cancer Neg Hx   . Esophageal cancer Neg Hx   . Rectal cancer Neg Hx   . Stomach cancer Neg Hx     SOCIAL HISTORY: History   Social History  . Marital Status: Married    Spouse Name: Charlett Nose  . Number of Children: 2  . Years of Education: N/A   Occupational History  . barber     still working   Social History Main Topics  . Smoking status: Never Smoker   . Smokeless tobacco: Never Used  . Alcohol Use: No  . Drug Use: No  . Sexual Activity: No   Other Topics Concern  . Not on file   Social History Narrative   Patient drinks 4 cups of a caffinated Drinks week.    REVIEW OF SYSTEMS: Constitutional: No fevers, chills, or sweats, no generalized  fatigue, change in appetite Eyes: No visual changes, double vision, eye pain Ear, nose and throat: No hearing loss, ear pain, nasal congestion, sore throat Cardiovascular: No chest pain, palpitations Respiratory:  No shortness of breath at rest or with exertion, wheezes GastrointestinaI: No nausea, vomiting, diarrhea, abdominal pain, fecal incontinence Genitourinary:  No dysuria, urinary retention or frequency Musculoskeletal:  No neck pain, back pain Integumentary: No rash, pruritus, skin lesions Neurological: as above Psychiatric: No depression, insomnia, anxiety Endocrine: No palpitations, fatigue, diaphoresis, mood swings, change in appetite, change in weight, increased thirst Hematologic/Lymphatic:  No anemia, purpura, petechiae. Allergic/Immunologic: no itchy/runny eyes, nasal congestion, recent allergic reactions, rashes  PHYSICAL EXAM: Filed Vitals:   09/24/14 1301  BP: 138/66  Pulse: 88  Resp: 16   General: No acute distress Head:  Normocephalic/atraumatic Eyes: Fundoscopic exam shows bilateral sharp discs, no vessel changes, exudates, or hemorrhages Neck: supple, no paraspinal tenderness, full range of motion Back: No paraspinal tenderness Heart: regular rate and rhythm Lungs: Clear to auscultation bilaterally. Vascular: No carotid bruits. Skin/Extremities: No rash, no edema Neurological Exam: Mental status: alert and oriented to person, place, and time, no dysarthria or aphasia, Fund of knowledge is appropriate.  Remote memory intact.  Attention and concentration are impaired. Able to name objects and repeat phrases. Difficulty with naming words beginning with F (0 words), difficulty with 3-step commands. Montreal Cognitive Assessment  09/24/2014  Visuospatial/ Executive (0/5) 3  Naming (0/3) 3  Attention: Read list of digits (0/2) 2  Attention: Read list of letters (0/1) 1  Attention: Serial 7 subtraction starting at 100 (0/3) 0  Language: Repeat phrase (0/2) 2    Language : Fluency (0/1) 1  Abstraction (0/2) 1  Delayed Recall (0/5) 2  Orientation (0/6) 5  Total 20  Adjusted Score (based on education) 20   Cranial nerves: CN I: not tested CN II: pupils equal, round and reactive to light, visual fields intact, fundi unremarkable. CN III, IV, VI:  full range of motion, no nystagmus, no ptosis CN V: facial sensation intact CN VII: upper and lower face symmetric CN VIII: hearing intact to finger rub CN IX, X: gag intact, uvula midline CN XI: sternocleidomastoid and trapezius muscles intact CN XII: tongue midline Bulk & Tone: normal, no fasciculations. Motor: 5/5 throughout with no pronator drift. Sensation:  decreased pin and cold on right foot, otherwise intact to all modalities on both UE, intact to vibration and joint position sense on both LE.  No extinction to double simultaneous stimulation.  Romberg test negative Deep Tendon Reflexes: +1 throughout except for absent ankle jerks bilaterally, no ankle clonus Plantar responses: downgoing bilaterally Cerebellar: no incoordination on finger to nose, heel to shin. No dysdiadochokinesia Gait: narrow-based and steady, able to tandem walk adequately. Tremor: none  IMPRESSION: This is a 74 year old right-handed man with vascular risk factors including hypertension, diabetes, hyperlipidemia, peripheral vascular disease with 2 strokes in Nov/December 2015 due to left ICA occlusion, intracranial atherosclerosis, previous TIA in 2012, presenting with worsening memory since his last strokes. His MOCA score today is 20/30, indicating mild cognitive impairment. With history of worsening since the strokes, certainly vascular cause is considered. We discussed other causes of memory loss, check TSH. His B12 level was low, he has not started daily B12 supplements and will start 1018mcg as previously instructed. I personally reviewed MRI brain in December which shows mild to moderate chronic microvascular change,  more on the left hemisphere where watershed infarcts were seen. He may benefit from starting cholinesterase inhibitors such as Aricept, side effects and expectations from the medication were discussed. He will start low dose 5mg  daily x 1 week, then increase to 10mg  daily. We also discussed the importance of control of vascular risk factors and secondary stroke prevention, continue dual therapy with aspirin and Plavix. They know to go to ER immediately for any change in symptoms. The importance of physical exercise and brain stimulation exercises for brain health were also discussed. He will follow-up in 3 months.   Thank you for allowing me to participate in the care of this patient. Please do not hesitate to call for any questions or concerns.   Ellouise Newer, [name]  CC: Dr. Lenna Gilford

## 2014-09-24 NOTE — Patient Instructions (Addendum)
1. Bloodwork for TSH 2. Start daily vitamin B12 1059mcg once a day 3. Start Aricept 10mg : Take 1/2 tablet daily for 1 week, then increase to 1 tablet daily 4. Physical exercise and brain stimulation exercises are important for brain health 5. Continue control of BP, cholesterol, diabetes 6. Follow-up in 3 months, call for any problems

## 2014-09-25 DIAGNOSIS — E538 Deficiency of other specified B group vitamins: Secondary | ICD-10-CM | POA: Insufficient documentation

## 2014-10-02 ENCOUNTER — Other Ambulatory Visit: Payer: Self-pay | Admitting: Pulmonary Disease

## 2014-10-18 ENCOUNTER — Ambulatory Visit: Payer: Medicare Other | Admitting: Neurology

## 2014-10-26 ENCOUNTER — Other Ambulatory Visit (HOSPITAL_COMMUNITY): Payer: Self-pay | Admitting: Interventional Radiology

## 2014-10-26 DIAGNOSIS — I639 Cerebral infarction, unspecified: Secondary | ICD-10-CM

## 2014-10-26 DIAGNOSIS — R413 Other amnesia: Secondary | ICD-10-CM

## 2014-10-26 DIAGNOSIS — R42 Dizziness and giddiness: Secondary | ICD-10-CM

## 2014-10-31 ENCOUNTER — Telehealth: Payer: Self-pay | Admitting: Pulmonary Disease

## 2014-10-31 NOTE — Telephone Encounter (Signed)
Pt daughter returning call and can be reached @  [phone].Hillery Hunter

## 2014-10-31 NOTE — Telephone Encounter (Signed)
Called spoke with pt spouse. She reports pt is wanting to get his onglyza for a [place] bc it will be cheaper for them. They have never got this medication from the [facility] and wants RX faxed to [phone]. Please advise SN if you would be okay with this? thanks

## 2014-11-01 ENCOUNTER — Ambulatory Visit (HOSPITAL_COMMUNITY): Payer: Medicare Other

## 2014-11-01 ENCOUNTER — Ambulatory Visit (HOSPITAL_COMMUNITY)
Admission: RE | Admit: 2014-11-01 | Discharge: 2014-11-01 | Disposition: A | Discharge status: Home / Self Care | Payer: Medicare Other | Source: Ambulatory Visit | Attending: Interventional Radiology | Admitting: Interventional Radiology

## 2014-11-01 DIAGNOSIS — R42 Dizziness and giddiness: Secondary | ICD-10-CM | POA: Insufficient documentation

## 2014-11-01 DIAGNOSIS — I6523 Occlusion and stenosis of bilateral carotid arteries: Secondary | ICD-10-CM | POA: Diagnosis not present

## 2014-11-01 DIAGNOSIS — I639 Cerebral infarction, unspecified: Secondary | ICD-10-CM

## 2014-11-01 DIAGNOSIS — I6503 Occlusion and stenosis of bilateral vertebral arteries: Secondary | ICD-10-CM | POA: Diagnosis not present

## 2014-11-01 DIAGNOSIS — Z85528 Personal history of other malignant neoplasm of kidney: Secondary | ICD-10-CM | POA: Insufficient documentation

## 2014-11-01 DIAGNOSIS — R413 Other amnesia: Secondary | ICD-10-CM | POA: Diagnosis not present

## 2014-11-01 DIAGNOSIS — Z8673 Personal history of transient ischemic attack (TIA), and cerebral infarction without residual deficits: Secondary | ICD-10-CM | POA: Diagnosis not present

## 2014-11-01 LAB — POCT I-STAT CREATININE: CREATININE: 1.7 mg/dL — AB (ref 0.61–1.24)

## 2014-11-01 MED ORDER — GADOBENATE DIMEGLUMINE 529 MG/ML IV SOLN
10.0000 mL | Freq: Once | INTRAVENOUS | Status: AC | PRN
  Administered 2014-11-01: 8 mL via INTRAVENOUS

## 2014-11-01 NOTE — Telephone Encounter (Signed)
Spoke with pt's wife, states she has already spoken to [facility] about this issue this morning.  Forwarding to Apolonio Schneiders to see what else is needed.

## 2014-11-01 NOTE — Telephone Encounter (Signed)
Per SN,  -Ok to write script and send to [facility] faxed to # given by spouse  Called wife and informed of the above orders Informed wife that i had already faxed the script to pharmacy and would be mailing the script to her just in case there is a problem  Script placed in mail Nothing further is needed.

## 2014-11-02 ENCOUNTER — Telehealth (HOSPITAL_COMMUNITY): Payer: Self-pay | Admitting: Interventional Radiology

## 2014-11-02 ENCOUNTER — Ambulatory Visit (HOSPITAL_COMMUNITY)
Admission: RE | Admit: 2014-11-02 | Discharge: 2014-11-02 | Disposition: A | Discharge status: Home / Self Care | Payer: Medicare Other | Source: Ambulatory Visit | Attending: Interventional Radiology | Admitting: Interventional Radiology

## 2014-11-02 DIAGNOSIS — I771 Stricture of artery: Secondary | ICD-10-CM

## 2014-11-02 DIAGNOSIS — R42 Dizziness and giddiness: Secondary | ICD-10-CM | POA: Diagnosis not present

## 2014-11-02 DIAGNOSIS — I6523 Occlusion and stenosis of bilateral carotid arteries: Secondary | ICD-10-CM | POA: Diagnosis not present

## 2014-11-02 DIAGNOSIS — I6503 Occlusion and stenosis of bilateral vertebral arteries: Secondary | ICD-10-CM | POA: Diagnosis not present

## 2014-11-02 NOTE — Telephone Encounter (Signed)
Called pt's wife, left VM for her to call me to schedule consult to review recent imaging studies. JM

## 2014-11-12 ENCOUNTER — Telehealth: Payer: Self-pay | Admitting: Pulmonary Disease

## 2014-11-12 MED ORDER — SAXAGLIPTIN HCL 5 MG PO TABS
5.0000 mg | ORAL_TABLET | Freq: Every day | ORAL | Status: DC
Start: 2014-11-12 — End: 2015-12-27

## 2014-11-12 NOTE — Telephone Encounter (Signed)
Spoke with pt's wife. See telephone encounter from 11/12/2014. Nothing further was needed.

## 2014-11-12 NOTE — Telephone Encounter (Signed)
lmtcb x1 for [employer]

## 2014-11-12 NOTE — Telephone Encounter (Signed)
Spoke with the pt's spouse  She states needing new rx for Onglyza faxed to [facility] [phone]  Rx was printed and placed on SN's cart to be signed and then it will be faxed

## 2014-11-15 ENCOUNTER — Telehealth: Payer: Self-pay | Admitting: Pulmonary Disease

## 2014-11-15 NOTE — Telephone Encounter (Signed)
Called made spouse aware we do not have any samples currently. Nothing further needed

## 2014-12-22 ENCOUNTER — Other Ambulatory Visit: Payer: Self-pay | Admitting: Pulmonary Disease

## 2014-12-25 ENCOUNTER — Ambulatory Visit: Payer: Medicare Other | Admitting: Neurology

## 2014-12-27 ENCOUNTER — Encounter: Payer: Self-pay | Admitting: *Deleted

## 2014-12-31 ENCOUNTER — Encounter: Payer: Self-pay | Admitting: Pulmonary Disease

## 2014-12-31 ENCOUNTER — Ambulatory Visit (INDEPENDENT_AMBULATORY_CARE_PROVIDER_SITE_OTHER): Payer: Medicare Other | Admitting: Pulmonary Disease

## 2014-12-31 VITALS — BP 120/60 | HR 81 | Temp 98.1°F | Wt 187.0 lb

## 2014-12-31 DIAGNOSIS — J069 Acute upper respiratory infection, unspecified: Secondary | ICD-10-CM

## 2014-12-31 DIAGNOSIS — R413 Other amnesia: Secondary | ICD-10-CM | POA: Diagnosis not present

## 2014-12-31 DIAGNOSIS — E118 Type 2 diabetes mellitus with unspecified complications: Secondary | ICD-10-CM

## 2014-12-31 DIAGNOSIS — M545 Low back pain, unspecified: Secondary | ICD-10-CM

## 2014-12-31 DIAGNOSIS — M150 Primary generalized (osteo)arthritis: Secondary | ICD-10-CM

## 2014-12-31 DIAGNOSIS — Q631 Lobulated, fused and horseshoe kidney: Secondary | ICD-10-CM

## 2014-12-31 DIAGNOSIS — IMO0002 Reserved for concepts with insufficient information to code with codable children: Secondary | ICD-10-CM

## 2014-12-31 DIAGNOSIS — I679 Cerebrovascular disease, unspecified: Secondary | ICD-10-CM

## 2014-12-31 DIAGNOSIS — E538 Deficiency of other specified B group vitamins: Secondary | ICD-10-CM

## 2014-12-31 DIAGNOSIS — E7800 Pure hypercholesterolemia, unspecified: Secondary | ICD-10-CM

## 2014-12-31 DIAGNOSIS — M159 Polyosteoarthritis, unspecified: Secondary | ICD-10-CM

## 2014-12-31 DIAGNOSIS — N289 Disorder of kidney and ureter, unspecified: Secondary | ICD-10-CM

## 2014-12-31 DIAGNOSIS — E1165 Type 2 diabetes mellitus with hyperglycemia: Secondary | ICD-10-CM

## 2014-12-31 MED ORDER — AZITHROMYCIN 250 MG PO TABS: ORAL_TABLET | ORAL | Status: DC

## 2014-12-31 MED ORDER — TRAMADOL HCL 50 MG PO TABS: 50.0000 mg | ORAL_TABLET | Freq: Three times a day (TID) | ORAL | Status: DC | PRN

## 2014-12-31 MED ORDER — GLYBURIDE 5 MG PO TABS: ORAL_TABLET | ORAL | Status: DC

## 2014-12-31 NOTE — Progress Notes (Signed)
Subjective:    Patient ID: Peter Nicholson, male    DOB: [birth year], 74 y.o.   MRN: [id number]  HPI 74 y/o WM here for a follow up visit... he has multiple medical problems as noted below...  ~  SEE PREV EPIC NOTES FOR OLDER DATA >>   ~  February 23, 2013:  45moROV & Peter Nicholson's CC is LBP & he is followed by DrElsner & [facility]arkins [facility], last ?[id number]& he will call for f/u eval; he has Tramadol for pain... We reviewed the following medical problems during today's office visit >>     OSA> he's been off CPAP x yrs & denies sleep disordered breathing, denies snoring, feels he rests well, & wakes refreshed w/o daytime hypersomnolence...    Borderline HBP> not currently on meds, just diet controlled; BP= 140/86 & reminded to avoid sodium & get weight down; he is essentially asymptomatic w/o CP, palpit, SOB, edema, etc...    Cerebrovasc dis> on ASA81 & Plavix75; known severe extracranial cerebrovasc dis w/ left CAE 2000 by DrLawson & known severe bilat cavernous ICA stenoses; he is also s/p vertebral art angioplasty x2 by DrTDeveshwar w/ known basilar art occlusion;     Chol> on Simva40 + diet; FLP 10/14 showed TChol 136, TG 54, HDL 49, LDL 77... Continue same.    DM> on Lantus10u, Metform500-2Bid, Diabeta5, Onglyza5, off Actos; Labs 10/14 showed BS=252, A1c=9.1 & Lantus added; f/u labs 11/14 showed BS=182, A1c=8.3; continue same, get wt down.    Overweight> weight = 198# and BMI~33; we reviewed diet, exercise, wt reduction strategies...    GI- GERD, divertics> on Protonix40 daily; he had EGD 11/13 showing HH, chr GERD, stricture dil w/ 25F Maloney dilator, erosive gastritis was HPylori neg & treated w/ daily PPI rx.    GU- Hx horseshoe kidney, renal cell cancer, nephrolithiasis> right side of horseshoe kid removed 1994 (RCCa); followed by Urology for yrs & no recurrence; Creat in the 1.2-1.5 range; PSA in the 1-3 range...    DJD, Gout, LBP> on Allopurinol300, Tramadol50, Nortrip ?24m followed by  DrElsner & DrHarkins on ESI injections & Nortrip?dose, last ?[id number] he will call for f/u eval...    Neuro- c/o memory impairment> on ASA81 & Plavix75, plus MVI etc; he has severe intracranial atherosclerotic dis and chr cerebellar infarcts on prev scans; more recent memory problems felt to be mild cognitive impairment by DrSethi on eval 3/14 (but he is clearly high risk for vascular dementia problems)...     Anxiety> on Xanax0.5 prn...  We reviewed prob list, meds, xrays and labs> see below for updates >> he had the 2014 flu vaccine in Oct... Pt & wife reminded that he needs more reg medical follow up visits.  LABS 10/14 in Epic> REVIEWED...Marland KitchenMarland Kitchen LABS 11/14:  Chems- OK x BS=182, A1c=8.3, BUN=29, Cr=1.2...Marland Kitchen  ~  May 24, 2013:  74 y.o. V & DM recheck; JamDiallos lost 5-6# down to 193# on diet + exercise; he reports home BS checks 120-200 range, feeling well & "splitting wood daily"... He remains on Lantus10, Metform500-2Bid, Diabeta5, Onglyza5;  Labs today showed BS=89, A1c=7.3... Marland KitchenMarland Kitchene reviewed prob list, meds, xrays and labs> see below for updates >>   ~  November 21, 2013:  74 y.o. 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 We reviewed the  following medical problems during today's office visit >>     OSA> he's been off CPAP x yrs & denies sleep disordered breathing, denies snoring, feels he rests well, & wakes refreshed w/o daytime hypersomnolence but he takes a nap every day...    Borderline HBP> not currently on meds, just diet controlled; BP= 142/84 & reminded to avoid sodium & get weight down; he is essentially asymptomatic w/o CP, palpit, SOB, edema, etc...    Cerebrovasc dis> on ASA81 & Plavix75; known severe extracranial cerebrovasc dis w/ left CAE 2000 by DrLawson & known severe bilat cavernous ICA stenoses; he is also s/p vertebral art angioplasty x2 by  DrTDeveshwar w/ known basilar art occlusion...     Chol> on Simva40 + diet; FLP 10/14 showed TChol 136, TG 54, HDL 49, LDL 77... Continue same.    DM> on Lantus10u, Metform500-2Bid, Diabeta5, Onglyza5; Labs 8/15 showed BS=167, A1c=7.3.Marland KitchenMarland Kitchen Continue meds and get wt down.    Overweight> weight = 192# and BMI~31; we reviewed diet, exercise, wt reduction strategies...    GI- GERD, divertics> on Protonix40 daily; he had EGD 11/13 showing HH, chr GERD, stricture dil w/ 67F Maloney dilator, erosive gastritis was HPylori neg & treated w/ daily PPI rx.    GU- Hx horseshoe kidney, renal cell cancer, nephrolithiasis> right side of horseshoe kid removed 1994 (RCCa); followed by Urology for yrs & no recurrence; Creat in the 1.2-1.5 range; PSA in the 1-3 range...    DJD, Gout, LBP> on Allopurinol300, Tramadol50, Nortrip ?93m; followed by DrElsner & DrHarkins on ESI injections & Nortrip?dose, last ?[id number]& he will call for f/u eval...    Neuro- c/o memory impairment> on ASA81 & Plavix75, plus MVI etc; he has severe intracranial atherosclerotic dis and chr cerebellar infarcts on prev scans; more recent memory problems felt to be mild cognitive impairment by DrSethi on eval 3/14 (but he is clearly high risk for vascular dementia problems)...     Anxiety> on Xanax0.5 prn...  We reviewed prob list, meds, xrays and labs> see below for updates >>   LABS 8/15:  Chems- ok w/ BS=167, A1c=7.3, Cr=1.2..Marland KitchenMarland Kitchen  ~  April 19, 2014:  74 y.o. OV & s/p 2 recent hospitalizations...Marland KitchenMarland Kitchen   1) Adm 11/15- 02/28/14 by [facility] when he presented w/ dysarthria; hx severe vasc dis w/ known severe extracranial cerebrovasc dis w/ left CAE 2000 by DrLawson & known severe bilat cavernous ICA stenoses; he is also s/p vertebral art angioplasty x2 by DrTDeveshwar w/ known basilar art occlusion; he was taking meds on his own & not monitored by family- he had stopped his Plavix (confused, didn't remember), ?DM meds & Simva; Hosp eval included CT Head & MRI/MRA  Brain showing old cerebellar & thalamic infarcts and small vessel dis, acute sm nonhemorrhaghic infarcts throughout the left frontal/ temporal/ & parietal areas; Left ICA is occluded (reconstitution of flow in supraclinoid region appears inadeq), mod stenosis of Right ICA cavernous segment; occluded right vertebral and right PICA; high grade stenosis of Left vertebral art & mod narrowing of prox basilar art- see full report!  Subseq CT Angio Head & Neck showed prev Left CAE markedly attenuated & occluded at skullbase/ petrous segment of the left ICA, diminutive right vertebral that occludes at the skullbase, dominant left vertebral w/ hi-grade stenosis distally, diffuse atherosclerotic changes... 2DEcho was neg for embolic source... He was placed back on ASA/Plavix & no intervention recommended, reminded about need for strict regulation of BP, Chol, DM... Speech improved in hosp &  disch for outpt f/u w/ DrSethi (he has not returned).Marland KitchenMarland Kitchen    2) Adm 12/27- 04/13/14 by [facility] w/ confusion, ataxia, & speech difficulty w/ BS found to be 666 (this is when it was discovered that he was taking his own meds without supervision & several meds hadn't been filled in months); he had an Enterococcus UTI- treated w/ Augmentin; repeat MRI/MRA Brain w/o change from Nov; he was re-evaluated by [facility] for IR- cerebral angiography performed and attempted revasc of the left ICA was attempted but unsuccessful, he was disch home on same meds (ASA/Plavix)- see below- w/ supervision of all meds from wife; Lisinopril was held & they rec keeping BP in the 130-160 range...     We reviewed the following medical problems during today's office visit >>     OSA> he's been off CPAP x yrs & denies sleep disordered breathing, denies snoring, feels he rests well, & wakes refreshed w/o daytime hypersomnolence but he takes a nap every day...    Borderline HBP> not currently on meds, just diet controlled; BP= 132/80 & reminded to avoid sodium & get  weight down; he is essentially asymptomatic w/o CP, palpit, SOB, edema, etc...    Cerebrovasc dis> back on his ASA81 & Plavix75 (he had stopped on his own- now supervised by wife); known severe extracranial cerebrovasc dis w/ left CAE 2000 by [facility] & known severe bilat cavernous ICA stenoses (occluded left); he is also s/p vertebral art angioplasty x2 by Azell Der w/ known basilar art occlusion & severe intracranial atherosclerotic dis; Scans 11/15 revealed acute watershed infarcts in left frontal/ temporal/ parietal lobes & old infarcts in cerebellum & thalamus; DrTDeveshwar attempted intervention to left carotid system but this was reported as unsuccessful (unsucceeful attempt at revascularization of occluded LT ICA cavernous segment, multiple collaterals noted in the LT ICA cav occluded segment)...    Chol> on Lip80 now + diet; FLP 12/15 on Simva40 w/ ?compliance showed TChol 175, TG 105, HDL 41, LDL 113 => changed to Atorva80.    DM> on Lantus15u now, Metform500-2Bid, Diabeta5AM, Onglyza5PM; compliance w/ meds was poor when taking his own meds; Labs 12/15 showed BS=146-665, A1c=9.3 => now all supervised by wife...    Overweight> weight = 181# and BMI~29; we reviewed diet, exercise, wt reduction strategies...    GI- GERD, divertics> on Protonix40 daily; he had EGD 11/13 showing HH, chr GERD, stricture dil w/ 58F Maloney dilator, erosive gastritis was HPylori neg & treated w/ daily PPI rx.    GU- Hx horseshoe kidney, renal cell cancer, nephrolithiasis> right side of horseshoe kid removed 1994 (RCCa); followed by Urology for yrs & no recurrence; Creat in the 1.1-1.5 range; PSA in the 1-3 range...    DJD, Gout, LBP> on Allopurinol300, Tramadol50, Nortrip 74m; followed by DrElsner & DrHarkins on ESI injections & Nortrip rx, last ?June2014 & stable at present, prev mowing, yard work, cBarista etc...    Neuro- memory impairment> on ASA81 & Plavix75, plus MVI etc; he has severe intracranial  atherosclerotic dis, chr cerebellar infarcts on prev scans, & new watershed infarcts on left 11/15; memory problems felt to be mild cognitive impairment by DrSethi on eval 3/14, vascular dementia in light of numerous sm infarcts on scans...     Anxiety> on Xanax0.5 prn & Zoloft50...  We reviewed prob list, meds, xrays and labs> see below for updates >>   XRays, Scans, Angiograpy, Labs from 11-12/20015 all reviewed w/ pt & wife...  ~  May 25, 2014:  165moOV &  here for DM recheck> on Lantus15, Metform500-2Bid, Diabeta5Qam, Onglyza5Qpm; all meds supervised by wife now; they state accuchecks are up & down (55-95 on the low, and 190-350 on the high);  Labs today showed BS=96, A1c improved to 7.6 => we decided to continue same meds & diet, ROV 3 months...  In addition his BP is sl elev 170/80 by nurse and 150/80 by me; wife states that Neuro wanted his BP sl higher due to severe vasc dis & perfusion concerns;  We decided to add LOSARTAN50 for his BP & vasc dis;  Continue other meds the same...   ~  Aug 23, 2014:  58moROV & Zidane reports doing well, no complaints or concerns;  Wife is very concerned regarding his memory & behavior changes and she is sched forback surg soon; requesting Neuro 2nd opinion for help w/ these problems... We reviewed the following medical problems during today's office visit >>     HBP> we added Losar50 last OV; BP= 144/76 & reminded to avoid sodium & get weight down; he is essentially asymptomatic w/o CP, palpit, SOB, edema, etc...    Cerebrovasc dis> on his ASA81 & Plavix75 (he had stopped on his own- now supervised by wife); [facility]/ [facility] known severe bilat cavernous ICA stenoses (occluded left); he is also s/p vertebral art angioplasty x2 by DAzell Derw/ known basilar art occlusion & severe intracranial atherosclerotic dis; Scans 11/15 revealed acute watershed infarcts in left frontal/ temporal/ parietal lobes & old  infarcts in cerebellum & thalamus; DrTDeveshwar attempted intervention to left carotid system but this was reported as unsuccessful (unsucceeful attempt at revascularization of occluded LICA cavernous segment w/ multiple collaterals noted)...    Chol> on Lip80 now + diet; FLP 5/16 showed TChol 113, TG 51, HDL 48, LDL 54 => much improved, continue the same.    DM> on Lantus15u now, Metform500-2Bid, Diabeta5AM, Onglyza5PM; compliance w/ meds was poor but wife supervises now; Labs 5/16 shows BS=130, A1c=6.8 => much improved, continue same.    GU- Hx horseshoe kidney, renal cell cancer, nephrolithiasis> right side of horseshoe kid removed 1994 (RCCa); followed by Urology for yrs & no recurrence; Creat in the 1.1-1.5 range; PSA in the 1-3 range...    Neuro- memory impairment> on ASA81 & Plavix75, plus MVI etc; he has severe intracranial atherosclerotic dis, chr cerebellar infarcts on prev scans, & new watershed infarcts on left 11/15; memory problems felt to be mild cognitive impairment by DrSethi on eval 3/14, vascular dementia in light of numerous sm infarcts on scans; wife wants 2nd opinion Neuro & we will refer...    Anxiety/Depression> on Xanax0.5 prn, Pamelor25,  & Zoloft50...     B12 deficiency> Labs 5/16 showed B12 level = 196 & rec to add 10069m VitB12 OTC to his MVI daily... We reviewed prob list, meds, xrays and labs> see below for updates >>   LABS 5/16:  FLP- at goals on Atorva80;  Chems- ok w/ BS=130, A1c=6.8, Cr=1.27;  CBC- ok w/ Hg=12.4;  B12 level= 196...  PLAN>> BP & DM control much improved on current meds w/ wife supervising his meds Rx; FLP looks good on Atorva80; severe vasc dis w/ mult interventions and prob multi-infarct dementia- we will refer to Neuro for 2nd opinion per request; Note Vit B12 is low & we will add 100068mB12 to his vitamin regimen...  ~  December 31, 2014:  80mo55mo & add-on appt requested for "congestion"; pt had  some difficulty remembering why he was here, then  noted 2d hx sinus congestion, drainage, blowing sl beige mucus, assoc w/ cough prod of a sm amt of similar beige sput; denies f/c/s, CP, SOB...     BP is better regulated w/ him on the Losar50; BP= 120/60 today & he denies CP, palpit, SOB, edema, etc...    After his last visit he went to see [facility] for [facility]-Neuro 6/16 noting memory loss, MOCA=20/30, hx of severe vasc dis etc; they started Aricept5=>10, MVI, and B12 supplement; also rec physical exercise & brain stimulation thru puzzles etc.    He also had a f/u w/ DrDeveshwar from IR 7/16> MRI/MRA of the Brain revealed chronic ischemicchanges stable from his prev scans, neg for acute infarcts; chr occlusion of left ICA, mod-severe stenosis of the right cavernous carotid, occlusion of the distal right vertebral art, mod-severe stenosisof the distal left vertebral as well; he is continuing to follow the pt... EXAM showed Afeb, VSS w/ BP=120/60;  HEENT- neg, mallampati2;  Chest- clear w/o w/r/r;  Heart- RR, w/o m/r/g;  Abd- soft, neg;  Ext- neg w/o c/c/e... IMP/PLAN>>  Maaz appears to have a mild URI w/ some sinus congestion & drainage;  He is a diabetic so we will try to avoid Pred and treat w/ ZPAK, [place], DELSYM, OTC antihist prn as well;  They request TRAMADOL 66m for pain- ok... He has a regular ROV sched for Nov.          Problem List:     Hearing Loss >> [facility] w/ sensorineural hearing [facility] & rec to consider hearing aides...   OBSTRUCTIVE SLEEP APNEA (ICD-327.23) - s/p split night sleep study 11/08- showing an AHI of 19, assoc w/ an O2 sat down to 78%... during the CPAP trial he titrated up to a pressure of 11cm but never ret to REM sleep so optimal pressure is still ???...he uses [facility] for his CPAP needs... ~  12/10: he tells me that he has stopped the CPAP, that he sleeps better without it, not snoring/ rests well, & wakes feeling refreshed... he refuses Sleep Med re-eval etc... ~  11/14: he's  been off CPAP x yrs & denies sleep disordered breathing, denies snoring, feels he rests well, & wakes refreshed w/o daytime hypersomnolence, he naps daily... ~  2015> he reports stable & denies sleep disordered breathing...  Hx of HYPERTENSION >>   ~  not currently on medications =>  ~  6/12: BP= 136/80 off meds & denies HA, visual changes, CP, palipit, dizziness, syncope, dyspnea, edema, etc... he states that home BP checks are "OK". ~  CXR 11/12 showed borderline heart size, tort Ao, clear lungs w/ ?underlying COPD, NAD...  ~  1/13: [place] Lasix; both stopped 1/13 by TP w/ renal insuffic & elev K+; subseq improved & BP stable... ~  2/13: BP= 112/68 & feeling weak etc...  3/13:  BP= 140/72 & feeling some better... ~  6/13:  BP= 138/78 & feeling much better; denies CP, palpit, ch in SOB or edema... ~  10/13:  BP= 140/82 & he is c/o reflux & dysphagia today, no CP/ palpit/ SOB/ edema... ~  11/14: not currently on meds, just diet controlled; BP= 140/86 & reminded to avoid sodium & get weight down; he is essentially asymptomatic w/o CP, palpit, SOB, edema, etc. ~  2/15: still not on meds, just diet controlled;  BP= 124/76 & he remains asymptomatic...  ~  8/15: not currently on meds, just diet controlled; BP= 142/84 & reminded to avoid sodium & get weight down; he is essentially asymptomatic w/o CP, palpit, SOB, edema, etc ~  1/16: still not on meds, just diet controlled; BP= 132/80 & Neuro has rec BP range of 130-160 to aid perfusion due to his severe cerebrovasc dis... ~  2/16: BP 170/80 => recheck 150/80 range; we decided to add Losartan 57m/d for BP & vasc dis... ~  5/16: on Losartan50; BP= 144/76 & he says he's asymptomatic, continue same...  CEREBROVASCULAR DISEASE (ICD-437.9) - stable on ASA 3257md & PLAVIX 7553m per DrDeveshwar/ DrLawson/ DrReynolds... he has severe extracranial cerebrovasc disease- esp in the post circulation, and he is s/p  vertebral art angioplasty x 2, and w/ a known basilar art occlusion- prev on Coumadin controlled by Neurology (switched to ASA/ Plavix in 2010)... also followed by DrLSheryn Bisond DrDeveshwar for IR... [facility] ~  CDoppler 2/07 showed 0-39% bilat ICA stenoses, s/p left CAE w/ DPA... ~  eval 3/10 by DrLSheryn Bison CDopplers that showed similar patency w/ 0-39% bilat ICA stenoses; due to the 10 yr interval DrLawson did not feel he needed any additional follow up. ~  12/10: continued f/u DrDevershwar IR w/ MRI/ MRAs showing bilat carotid siphon region atherosclerosis & narrowing (unchanged), and markedly diseased distal left vertebral & prox basilar art narrowing (no change)... ~  11/12: CTA head & neck showed similar severe dis w/ mod to severe bilat cavernous ICA stenoses due to extensive atherosclerosis; severe distal vertebral atherosclerosis- distal right occluded, distal left w/ high grade stenosis; mod to severe bilat PCA stenoses; chr cerebellar infarcts, no acute infarcts. ~  11/14: on ASA81 & Plavix75; known severe extracranial cerebrovasc dis w/ left CAE 2000 by DrLawson & known severe bilat cavernous ICA stenoses; he is also s/p vertebral art angioplasty x2 by DrTDeveshwar w/ known basilar art occlusion ~  8/15: on ASA81 & Plavix75; known severe extracranial cerebrovasc dis w/ left CAE 2000 by DrLawson & known severe bilat cavernous ICA stenoses; he is also s/p vertebral art angioplasty x2 by DrTDeveshwar w/ known basilar art occlusion, he endorses medication compliance & denies cerebral ischemic symptoms... ~  11/15: Hosp by [facility] when he presented w/ dysarthria; hx severe vasc dis w/ known severe extracranial cerebrovasc dis w/ [facility] severe bilat cavernous ICA stenoses; he is also s/p vertebral art angioplasty x2 by DrTDeveshwar w/ known basilar art occlusion; he was taking meds on his own & not monitored by family- he had stopped his Plavix  (confused, didn't remember), ?DM meds & Simva; Hosp eval included CT Head & MRI/MRA Brain showing old cerebellar & thalamic infarcts and small vessel dis, acute sm nonhemorrhaghic infarcts throughout the left frontal/ temporal/ & parietal areas; Left ICA is occluded (reconstitution of flow in supraclinoid region appears inadeq), mod stenosis of Right ICA cavernous segment; occluded right vertebral and right PICA; high grade stenosis of Left vertebral art & mod narrowing of prox basilar art- see full report!  Subseq CT Angio Head & Neck showed prev Left CAE markedly attenuated & occluded at skullbase/ petrous segment of the left ICA, diminutive right vertebral that occludes at the skullbase, dominant left vertebral w/ hi-grade stenosis distally, diffuse atherosclerotic changes... 2DEcho was neg for embolic source... He was placed back on ASA/Plavix & no intervention recommended, reminded about need for strict regulation of  BP, Chol, DM... Speech improved in [place] for outpt f/u w/ DrSethi (he did not return)... ~  12/15: Hosp by [facility] w/ confusion, ataxia, & speech difficulty w/ BS found to be 666 (this is when it was discovered that he was taking his own meds without supervision & several meds hadn't been filled in months); he had an Enterococcus UTI- treated w/ Augmentin; repeat MRI/MRA Brain w/o change from Nov; he was re-evaluated by [facility] for IR- cerebral angiography performed and attempted revasc of the left ICA was attempted but unsuccessful, he was disch home on same meds (ASA/Plavix) w/ supervision of all meds from wife; Lisinopril was held & they rec keeping BP in the 130-160 range...  ~  5/16: he has severe vascular dis & wife is concerned about memory loss & behavioral changes- continue ASA/ Plavix & she requests Neuro 2nd opinion...  VENOUS INSUFFICIENCY (ICD-459.81) - he knows to avoid sodium, elevate legs, wear support hose...  HYPERCHOLESTEROLEMIA (ICD-272.0) - on CRESTOR 46m/d  since Hosp 12/12... ~  FR[place]6/08 showed TChol 153, TG 120, HDL 36, LDL 93 ~  FLP 8/09 showed TChol 131, TG 84, HDL 38, LDL 76 ~  2010:  pt was not fasting for blood work... rec to continue Crestor10 + diet efforts. ~  FLP 10/11 showed TChol 210, TG 161, HDL 40, LDL 149... rec to start Crestor 269md, he will decide. ~  FLP 6/12 on Cres20 showed TChol 106, TG 95, HDL 41, LDL 46... Continue same. ~  FL[place]2/[place] showed TChol 100, TG 101, HDL 37, LDL 43... They decr to Cres10. ~  FLP 3/13 on Cres10 showed TChol 118, TG 67, HDL 44, LDL 61... Continue same, later switched to Simva40 for $$ reasons. ~  FLP 10/14 on Simva40 showed TChol 136, TG 54, HDL 49, LDL 77... Continue same. ~  FLP 12/15 on Simva40 w/ ?compliance showed TChol 175, TG 105, HDL 41, LDL 113 => changed to [place]/ supervision of all meds by wife. ~  FLP 5/16 on Atorva80 showed TChol 113, TG 51, HDL 48, LDL 54 => much improved, continue the same.  DIABETES MELLITUS (ICD-250.00) >>  ~  on METFORMIN 50076m2Bid, GLYBURIDE 5mg29m, ACTOS 30mg31m& ONGLYZA 5mg/d81m ~  labs 6/08 showed BS= 155, HgA1c= 8.1... (glMarland Kitchenburide incr to 5Bid). ~  labs 11/08 showed BS= 352, HgA1c= 8.1.... (Actos added). ~  labs 8/09 showed BS= 128, HgA1c= 7.1... recMarland KitchenMarland Kitchensame meds, better diet, get wt down! ~  labs 2/10 showed BS= 249, A1c= 7.3.... Metform incr to 2Bid. ~  labs 12/10 (nonfasting- on all 3 meds) showed BS= 87, A1c= 6.9 ~  labs 10/11 on Metform1000Bid+Gybur5Bid showed BS= 180, A1c= 8.4... recMarland KitchenMarland Kitcheno incr GLYBUR10Bid + diet. ~  labs 6/12 on Metform1000Bid+Glybur10Bid showed BS= 196, A1c= 11.2 ?what happened?> he declined insulin & Endocrine consult, opted for max oral meds by adding ACTOS 30mg/d37mNGLYZA 5mg/d..95m  Labs 12/12 in Hosp shoBarstowBS 150-200+ and A1c= 7.2; covered w/ SSI in hosp & placed back on 4 oral meds on disch from rehab... ~  Labs 1/13 on all 4 meds showed BS= 230, A1c=7.1; we reviewed diet & he will start PT soon... ~   Labs 3/13 on 4meds sh59md BS= 72, A1c= 6.6 ~  4/13: pt called w/ hypoglycemia & we decreased Glyburide 5mg from 43md to 1Bid... ~  6/13:  Pt on Metform500-2Bid, Glybur5mgBid, Ac25m30, Onglyza5; BS at home 70-90 he says & still  drinking Boost; Labs showed BS=190  A1c=7.4  ; we decided to cut the Glyburide68m Qam only but keep the Metform500-2Bid +Actos +Onglyza... ~  10/13: on his 442ms> BS=171, A1c=7.7 but he's gained 16#; told to STOP the boost, get on diet, get wt down, take all meds every day... ~  10/14: seen by TP> Labs 10/14 showed BS=252, A1c=9.1 & Lantus added to his Metform500-2Bid, Diabeta5, Onglyza5; Lantus10u added... ~  11/14: on Lantus10u, Metform500-2Bid, Diabeta5, Onglyza5, off Actos; f/u labs 11/14 showed BS=182, A1c=8.3; continue same, get wt down. ~  8/15: on Lantus10u, Metform500-2Bid, Diabeta5, Onglyza5; Labs 8/15 showed BS=167, A1c=7.3...Marland KitchenMarland Kitchenontinue meds and get wt down. ~  12/15: now on Lantus15u now, Metform500-2Bid, Diabeta5AM, Onglyza5PM; compliance w/ meds was poor when taking his own meds; Labs 12/15 showed BS=146-665, A1c=9.3 => now all supervised by wife. ~  2/16: on Lantus15u now, Metform500-2Bid, Diabeta5AM, Onglyza5PM; Labs 2/16 showed BS=96, A1c=7.6, therefore continue same meds & diet... ~  5/16: on Lantus15u, Metform500-2Bid, Diabeta5AM, Onglyza5PM; compliance w/ meds was poor but wife supervises now; Labs 5/16 shows BS=130, A1c=6.8 => much improved, continue same.  OVERWEIGHT (ICD-278.02) - he states that he is not on much of a diet & we reviewed low carb/ no sweets/ low fat diet. ~  weight 8/09 = 216# ~  weight 2/10 = 216# ~  weight 12/10 = 212# ~  weight 10/11 = 210# ~  Weight 6/12 = 206# and he admits to not being on much of a diet... ~  Weight 2/13 = non weight bearing still... ~  Weight 3/13 = 193# ~  Weight 6/13 = 191# ~  Weight 10/13 = 206# ~  Weight 11/14 = 198# ~  Weight 2/15 = 193# ~  Weight 1/16 = 181# ~  Weight 5/16 = 189#  GERD (ICD-530.81) -  prev on Nexium40, he switched to OTC Prilosec, now PRHebron090m due to Plavix Rx; pt had stopped this on his own & asked to restart 10/13. ~  EGD 19/03 showed 4cmDawsonERD & esoph stricture dilated... ~  EGD 12/09 by DrPatterson showed HH & severe erosive esophagitis from GERD... ~  10/13: presents w/ incr reflux- w/ dysphagia & food sticking in mid-esoph that occurs every 2-3days & has to vomit to feel better; he had stopped his PPI on his own (?why?), rec to restart the Protonix40 & we will refer to GI for EGD & dilatation... ~  10/13: he saw DrPatterson> EGD 11/13 showed HH, chr GERD, stricture dil w/ 90F Maloney dilator, erosive gastritis was HPylori neg & treated w/ daily PPI rx...  ~  He remains on Protonix40/d & doing satis w/o reflux symptoms...  DIVERTICULOSIS OF COLON (ICD-562.10) - note: [place]; [place] noted loose stools on the Metformin 1000m70m... ~  colonoscopy 10/03 by DrPatterson showed divertics only... ~  colonoscopy 12/09 by DrPatterson was also normal x for divertics... ~  He denies n/v, c/d, blood seen...  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 Hx of RENAL CELL CANCER (ICD-189.0) - he had a partial nephrectomy of the right side of his horseshoe kidney in 1994 due to renal cell ca... still followed by DrPeMedinarly by his hx but we don't have any recent notes... ~  labs 10/11 showed PSA= 0.95 ~  Labs 3/13 showed BUN=21, Creat=1.1, PSA=2.75 ~  Labs 10/13 showed BUN= 25, Creat= 1.2 ~  Labs  10/14 showed BUN=38, Cr=1.5 (w/ enterococcus UTI);  Labs 11/14 showed BUN=29, Cr=1.2 ~  Labs 8/15 showed BUN= 24, Cr= 1.2 ~  Labs 12/15 showed Cr= 1.1-1.4 ~  Labs 2/16 showed BUN= 18, Cr= 1.12  Hx of NEPHROLITHIASIS (ICD-592.0) - remote hx of kidney stones followed by DrPeterson...  DEGENERATIVE JOINT DISEASE (ICD-715.90) &  GOUT (ICD-274.9) - on  ALLOPURINOL 312m/d... ~  labs 10/11 showed URIC= 4.7 ~  [facility] fall from ladder cleaning gutters w/ complex right tibial plat fx- ORIF, 2 operations by DrHardy, & hosp course complic by ileus, TIA, electrolyte abn, etc;  Event disch to Rehab & [facility]  BACK PAIN, LUMBAR (ICD-724.2) - known degenerative spondylitic disease at L4-5 and L5-S1 [facility] Dr[facility] DrHarkins)... ~  6/12: he reports further back pain w/ eval by Chiropractor showing L4 disc prob & regular adjustments; but now notes incr pain & he requests referral to Neurosurg for further eval ==> seen by DrElsner, then DrHarkins for shots which has helped... ~  2014: he has continued f/u w/ DrHarkins[facility] & received periodic ESI injections for his Lumbar DDD; they added NORTRIPTYLINE 220mhs & slowly incr the dose  NEURO >> Hx Left Hemispheric TIA in 11/12 & MEMORY IMPAIRMENT ~  3/14:  He had Neuro eval DrSethi> he did Labs, EEG, MRI Brain, & MRA Brain & Neck; Rec to take 2 FishOil caps daily, CerefolinNAC daily, work puzzles etc, continue Plavix & strict control of BP, Chol, DM; they did not start Aricept... ~  11/15 & 12/15> see above> Hosp w/ wastershed strokes left frontal/ temporal/ parietal areas when he wasn't taking his Plavix; now all meds supervised by wife; he had MRIs, CT Angio, and Arteriograms w/ attempted intervention on left by DrTDeveshwar- unsuccessful & rec for continued ASA/ Plavix, & control on BP, Chol, DM w/ BP in the 130-160 range... 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  ANXIETY (ICD-300.00) - uses Zolloft50 & ALPRAZOLAM 0.74m29ms needed.  B12 DEFICIENCY >>  ~  5/16:  Labs showed B12 = 196 & rec to start 1000m94my mouth daily & we will f/i labs...   Past Surgical History  Procedure Laterality Date  . Incisional hernia  repair  1/96    Dr.Leone  . Nasal sinus surgery  12/96    Dr.Redman  . Cholecystectomy  9/04    Dr. LeonRebekah ChesterfieldNephrectomy  1994    renal cell cancer in horseshoe kidney; right  . Carotid endarterectomy  09/2000    Dr.Lawson  . Veterbral art angioplasty      x2  . Small intestine surgery    . External fixation leg  03/10/2011    Procedure: EXTERNAL FIXATION LEG;  Surgeon: MichRozanna Boxocation: [facility] [place] [facility]opedics;  Laterality: Right;  . Orif tibia plateau  03/24/2011    Procedure: OPEN REDUCTION INTERNAL FIXATION (ORIF) TIBIAL PLATEAU;  Surgeon: MichRozanna Boxocation: [facility] [place]: Orthopedics;  Laterality: Right;  . Radiology with anesthesia N/A 04/12/2014    Procedure: ANGIOPLASTY;  Surgeon: SanjRob Hickman;  [facility] Radiology;  Laterality: N/A;    Outpatient Encounter Prescriptions as of 12/31/2014  Medication Sig  . ALPRAZolam (XANAX) 0.5 MG tablet TAKE 1/2 TO 1 TABLET BY MOUTH THREE TIMES DAILY  AS NEEDED FOR NERVES  . aspirin EC 81 MG EC tablet Take 1 tablet (81 mg total) by mouth daily.  Marland Kitchen atorvastatin (LIPITOR) 80 MG tablet TAKE 1 TABLET BY MOUTH EVERY DAY AT 6PM  . clopidogrel (PLAVIX) 75 MG tablet Take 1 tablet (75 mg total) by mouth daily.  Marland Kitchen donepezil (ARICEPT) 10 MG tablet Take 1/2 tablet daily for 1 week, then increase to 1 tablet daily  . glyBURIDE (DIABETA) 5 MG tablet TAKE ONE TABLET BY MOUTH EVERY MORNING WITH BREAKFAST  . LANTUS SOLOSTAR 100 UNIT/ML Solostar Pen INJECT 10 UNITS INTO THE SKIN AT BEDTIME. (Patient taking differently: 15 units daily)  . losartan (COZAAR) 50 MG tablet TAKE 1 TABLET (50 MG TOTAL) BY MOUTH DAILY.  . metFORMIN (GLUCOPHAGE) 500 MG tablet Take 2 tablets by mouth two times daily  . nortriptyline (PAMELOR) 25 MG capsule Take 25 mg by mouth at bedtime.  . pantoprazole (PROTONIX) 40 MG tablet Take 1 tablet (40 mg total) by mouth daily.  . saxagliptin HCl (ONGLYZA) 5 MG TABS tablet Take 1 tablet (5  mg total) by mouth daily.  . sertraline (ZOLOFT) 50 MG tablet TAKE 1 TABLET (50 MG TOTAL) BY MOUTH AT BEDTIME.  . [DISCONTINUED] glyBURIDE (DIABETA) 5 MG tablet TAKE ONE TABLET BY MOUTH EVERY MORNING WITH BREAKFAST  . allopurinol (ZYLOPRIM) 300 MG tablet TAKE 1 TABLET (300 MG TOTAL) BY MOUTH DAILY. (Patient not taking: Reported on 12/31/2014)  . azithromycin (ZITHROMAX Z-PAK) 250 MG tablet Use as directed  . traMADol (ULTRAM) 50 MG tablet Take 1 tablet (50 mg total) by mouth 3 (three) times daily as needed.   No facility-administered encounter medications on file as of 12/31/2014.    Allergies  Allergen Reactions  . Dilaudid [Hydromorphone Hcl] Other (See Comments)    hallucinations  . Fentanyl Other (See Comments)    hallucinations    Current Medications, Allergies, Past Medical History, Past Surgical History, Family History, and Social History were reviewed in [employer] record.    Review of Systems        See HPI - all other systems neg except as noted...  The patient complains of dyspnea on exertion.  The patient denies anorexia, fever, weight loss, weight gain, vision loss, decreased hearing, hoarseness, chest pain, syncope, peripheral edema, prolonged cough, headaches, hemoptysis, abdominal pain, melena, hematochezia, severe indigestion/heartburn, hematuria, incontinence, muscle weakness, suspicious skin lesions, transient blindness, difficulty walking, depression, unusual weight change, abnormal bleeding, enlarged lymph nodes, and angioedema.     Objective:   Physical Exam     WD, WN, 74 y/o WM in NAD...  GENERAL:  Alert & oriented; pleasant & cooperative... HEENT:  [place]/AT, EOM-wnl, PERRLA, EACs-clear, TMs-wnl, NOSE-clear, THROAT-clear & wnl. NECK:  Supple w/ fairROM; no JVD; s/p left CAE, +bruits; no thyromegaly or nodules palpated; no lymphadenopathy. CHEST:  Clear to P & A; without wheezes/ rales/ or rhonchi heard... HEART:  Regular Rhythm; gr 1/6 SEM  without rubs or gallops detected... ABDOMEN:  Soft & nontender; normal bowel sounds; no organomegaly or masses palpated... EXT: right knee deformity s/p ORIF, mod arthritic changes; no varicose veins/ +venous insuffic/ 1+ edema. NEURO:  CN's intact; no focal neuro deficits... DERM:  No lesions noted; no rash etc...  RADIOLOGY DATA:  Reviewed in the EPIC EMR & discussed w/ the patient...  LABORATORY DATA:  Reviewed in the EPIC EMR & discussed w/ the patient...   Assessment & Plan:    URI 9/16>  Jonluke appears to have a  mild URI w/ some sinus congestion & drainage;  He is a diabetic so we will try to avoid Pred and treat w/ ZPAK, [place], DELSYM, OTC antihist prn as well;  They request TRAMADOL 76m for pain- ok... He has a regular ROV sched for Nov.   BP>  Neuro has wanted his BP to be sl higher than usual for perfusion reasons but BP= 170/80 on arrival & improved to 150/80 w/ rest; we opted to start Losartan 550md for BP & vasc dis... 5/16> BP= 144/76 today, continue Losar50... 9/16> BP= 120/60 & he remains asymptomatic...  CEREBROVASC Disease & STROKES 11/15 & TIA 12/12>  Known severe cerebrovasc dis & he had TIA while in hosp for fx leg 2012 (he was off plavix at the time); Recurrent prob 11/15 w/ watershed infarcts in left frontal/ temporal/ parietal regions while he was off Plavix (he was taking meds on his own & hadn't been filling the Plavix); s/p extensive eval w/ MRI/MRA, CT Angio & Arteriogram by DrLadene Artist/ attempt at intervention on left unsuccessful; wife now supervises all meds... He has prob multi-infarct dementia & wife requests Neuro 2nd opinion - we will refer...  NEURO> Mild Cognitive Impairment per DrSethi & wife is concerned re: memory/ vascular dementia; we will arrange for Neuro 2nd opinion...  CHOL>  on Atrova80 now & f/u FLP much improved...  DM>  On Lantus15, Metform, Diabeta, Onglyza & compliance is better w/ wife supervision; Labs 5/16 showed BS=130, A1c=6.8,  continue same...  Overweight>  We discussed low carb, low fat wt reducing diet...  GI>  GERD prev controlled w/ Protonix but he stopped on his own (?why?); EGD 11/13 w/ dilatation & reminded to take the Protonix40 every day...  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  DJD/ Hx Gout/ LBP>  He was seeing chiropractor regularly regarding LBP & then f/u w/ DrElsner,Neurosurg & had ESI by DrHarkins & the shots have helped...  s/p fall w/ right Tibial plateaux fx> 1st ext fixation, 2nd ORIF>  By DrHardy, Ortho; exercise is difficult but very much needed; he's had extensive PT training...  Anxiety>  He remains on Alpraz Prn... Also has PaGratzn his list of meds...  B12 deficiency>  Labs 5/16 w/ B12 level = 196; rec to start oral B12 supplement ~100053md & we will repeat level in follow up...   Patient's Medications  New Prescriptions   AZITHROMYCIN (ZITHROMAX Z-PAK) 250 MG TABLET    Use as directed   TRAMADOL (ULTRAM) 50 MG TABLET    Take 1 tablet (50 mg total) by mouth 3 (three) times daily as needed.  Previous Medications   ALLOPURINOL (ZYLOPRIM) 300 MG TABLET    TAKE 1 TABLET (300 MG TOTAL) BY MOUTH DAILY.   ALPRAZOLAM (XANAX) 0.5 MG TABLET    TAKE 1/2 TO 1 TABLET BY MOUTH THREE TIMES DAILY AS NEEDED FOR NERVES   ASPIRIN EC 81 MG EC TABLET    Take 1 tablet (81 mg total) by mouth daily.   ATORVASTATIN (LIPITOR) 80 MG TABLET    TAKE 1 TABLET BY MOUTH EVERY DAY AT 6PM   CLOPIDOGREL (PLAVIX) 75 MG TABLET    Take 1 tablet (75 mg total) by mouth daily.   DONEPEZIL (ARICEPT) 10 MG TABLET    Take 1/2 tablet daily for 1 week, then increase to 1 tablet daily   LANTUS SOLOSTAR 100 UNIT/ML SOLOSTAR PEN    INJECT 10 UNITS  INTO THE SKIN AT BEDTIME.   LOSARTAN (COZAAR) 50 MG TABLET    TAKE 1 TABLET (50 MG TOTAL) BY MOUTH DAILY.   METFORMIN (GLUCOPHAGE) 500 MG TABLET    Take 2 tablets by mouth two times daily   NORTRIPTYLINE (PAMELOR) 25  MG CAPSULE    Take 25 mg by mouth at bedtime.   PANTOPRAZOLE (PROTONIX) 40 MG TABLET    Take 1 tablet (40 mg total) by mouth daily.   SAXAGLIPTIN HCL (ONGLYZA) 5 MG TABS TABLET    Take 1 tablet (5 mg total) by mouth daily.   SERTRALINE (ZOLOFT) 50 MG TABLET    TAKE 1 TABLET (50 MG TOTAL) BY MOUTH AT BEDTIME.  Modified Medications   Modified Medication Previous Medication   GLYBURIDE (DIABETA) 5 MG TABLET glyBURIDE (DIABETA) 5 MG tablet      TAKE ONE TABLET BY MOUTH EVERY MORNING WITH BREAKFAST    TAKE ONE TABLET BY MOUTH EVERY MORNING WITH BREAKFAST  Discontinued Medications   No medications on file

## 2014-12-31 NOTE — Patient Instructions (Signed)
Today we updated your med list in our EPIC system...    Continue your current medications the same...  We wrote for a ZPak (antibiotic) for your uppe resp infection...  You may use MUCINEX 600mg  tabs- 1 to 2 tabs twice daily w/ fluids for the thick mucus...  You may use DELSYM OTC 2 tsp twice daily as needed for cough...  Try ALLEGRA OTC as needed for allergy symptoms...  Call for any questions or if we can be of service in any way.Marland KitchenMarland Kitchen

## 2015-02-15 ENCOUNTER — Other Ambulatory Visit: Payer: Self-pay | Admitting: Pulmonary Disease

## 2015-02-15 NOTE — Telephone Encounter (Signed)
Rx request for Xanax. Last OV: 12/31/14 Last refill: 08/06/14 Next OV:  02/25/15  Ok to refill?

## 2015-02-18 NOTE — Telephone Encounter (Signed)
Per SN>>Ok to refill with no additional refills  Refill called into pt's pharmacy  Nothing further is needed

## 2015-02-19 ENCOUNTER — Other Ambulatory Visit: Payer: Self-pay | Admitting: Pulmonary Disease

## 2015-02-22 ENCOUNTER — Telehealth: Payer: Self-pay | Admitting: Pulmonary Disease

## 2015-02-22 NOTE — Telephone Encounter (Signed)
Spoke with patient wife, states that the pharmacy has not received the medication refill for Alprazolam 0.5mg  I advised her that our office phoned this in on 02/18/15 and she states that she has called up there multiple times and they do not have an Rx ready for them.  Advised that I would contact pharmacy to check for Rx.   Pharmacist ([place]) at [facility] in [place] states that this Rx was filled and picked up by the patient on 02/18/15 at around 12pm  Called pt wife back, advised her that Rx has already been filled and she stated that maybe her husband picked it up and she was unaware. States that she will check around the house and let us know if anything further needed.

## 2015-02-22 NOTE — Telephone Encounter (Signed)
Left message for spouse to call back

## 2015-02-22 NOTE — Telephone Encounter (Signed)
Patient Returned call  [phone]

## 2015-02-25 ENCOUNTER — Ambulatory Visit (INDEPENDENT_AMBULATORY_CARE_PROVIDER_SITE_OTHER): Payer: Medicare Other | Admitting: Pulmonary Disease

## 2015-02-25 ENCOUNTER — Encounter: Payer: Self-pay | Admitting: Pulmonary Disease

## 2015-02-25 ENCOUNTER — Other Ambulatory Visit (INDEPENDENT_AMBULATORY_CARE_PROVIDER_SITE_OTHER): Payer: Medicare Other

## 2015-02-25 VITALS — BP 126/68 | HR 82 | Temp 97.8°F | Wt 188.0 lb

## 2015-02-25 DIAGNOSIS — I679 Cerebrovascular disease, unspecified: Secondary | ICD-10-CM

## 2015-02-25 DIAGNOSIS — M150 Primary generalized (osteo)arthritis: Secondary | ICD-10-CM

## 2015-02-25 DIAGNOSIS — IMO0002 Reserved for concepts with insufficient information to code with codable children: Secondary | ICD-10-CM

## 2015-02-25 DIAGNOSIS — E118 Type 2 diabetes mellitus with unspecified complications: Secondary | ICD-10-CM | POA: Diagnosis not present

## 2015-02-25 DIAGNOSIS — E538 Deficiency of other specified B group vitamins: Secondary | ICD-10-CM

## 2015-02-25 DIAGNOSIS — E7800 Pure hypercholesterolemia, unspecified: Secondary | ICD-10-CM | POA: Diagnosis not present

## 2015-02-25 DIAGNOSIS — M545 Low back pain, unspecified: Secondary | ICD-10-CM

## 2015-02-25 DIAGNOSIS — N289 Disorder of kidney and ureter, unspecified: Secondary | ICD-10-CM

## 2015-02-25 DIAGNOSIS — R413 Other amnesia: Secondary | ICD-10-CM

## 2015-02-25 DIAGNOSIS — C649 Malignant neoplasm of unspecified kidney, except renal pelvis: Secondary | ICD-10-CM

## 2015-02-25 DIAGNOSIS — Z794 Long term (current) use of insulin: Secondary | ICD-10-CM

## 2015-02-25 DIAGNOSIS — M159 Polyosteoarthritis, unspecified: Secondary | ICD-10-CM

## 2015-02-25 DIAGNOSIS — E1165 Type 2 diabetes mellitus with hyperglycemia: Secondary | ICD-10-CM

## 2015-02-25 LAB — VITAMIN B12: Vitamin B-12: 426 pg/mL (ref 211–911)

## 2015-02-25 LAB — BASIC METABOLIC PANEL
BUN: 25 mg/dL — AB (ref 6–23)
CHLORIDE: 112 meq/L (ref 96–112)
CO2: 26 mEq/L (ref 19–32)
Calcium: 9.7 mg/dL (ref 8.4–10.5)
Creatinine, Ser: 1.15 mg/dL (ref 0.40–1.50)
GFR: 65.84 mL/min (ref >60.00)
Glucose, Bld: 226 mg/dL — ABNORMAL HIGH (ref 70–99)
Potassium: 5.5 mEq/L — ABNORMAL HIGH (ref 3.5–5.1)
Sodium: 144 mEq/L (ref 135–145)

## 2015-02-25 LAB — HEMOGLOBIN A1C: Hgb A1c MFr Bld: 6.7 % — ABNORMAL HIGH (ref 4.6–6.5)

## 2015-02-25 NOTE — Patient Instructions (Signed)
Today we updated your med list in our EPIC system...    Continue your current medications the same...  Today we did your follow up blood work...    We will contact you w/ the results when available...   We gave you the 2016 FLU vaccine...  Stay physically active & find some engaging mental exercise as well...  Call for any questions...  Let's plan a follow up visit in 78mo, sooner if needed for problems.Marland KitchenMarland Kitchen

## 2015-02-25 NOTE — Progress Notes (Signed)
Subjective:    Patient ID: Peter Nicholson, male    DOB: 1940/11/19, 73 y.o.   MRN: [id number]  HPI 74 y/o WM here for a follow up visit... he has multiple medical problems as noted below...  ~  SEE PREV EPIC NOTES FOR OLDER DATA >>    Hx OSA but he's been off CPAP x yrs & denies sleep disordered breathing, denies snoring, feels he rests well, & wakes refreshed w/o daytime hypersomnolence but he takes a nap every day...  known severe extracranial cerebrovasc dis w/ left CAE 2000 by [facility] & known severe bilat cavernous ICA stenoses; he is also s/p vertebral art angioplasty x2 by DrTDeveshwar w/ known basilar art occlusion  he has severe intracranial atherosclerotic dis and chr cerebellar infarcts on prev scans; memory problems felt to be mild cognitive impairment by DrSethi on eval 3/14 (but he is clearly high risk for vascular dementia problems)...   LABS 10/14 in Epic> REVIEWED.Marland KitchenMarland Kitchen   LABS 11/14:  Chems- OK x BS=182, A1c=8.3, BUN=29, Cr=1.2.Marland KitchenMarland Kitchen   LABS 8/15:  Chems- ok w/ BS=167, A1c=7.3, Cr=1.2.Marland KitchenMarland Kitchen   ~  April 19, 2014:  68moROV & s/p 2 recent hospitalizations..Marland KitchenMarland Kitchen    1) Adm 11/15- 02/28/14 by [facility] when he presented w/ dysarthria; hx severe vasc dis w/ known severe extracranial cerebrovasc dis w/ left CAE 2000 by DrLawson & known severe bilat cavernous ICA stenoses; he is also s/p vertebral art angioplasty x2 by DrTDeveshwar w/ known basilar art occlusion; he was taking meds on his own & not monitored by family- he had stopped his Plavix (confused, didn't remember), ?DM meds & Simva; Hosp eval included CT Head & MRI/MRA Brain showing old cerebellar & thalamic infarcts and small vessel dis, acute sm nonhemorrhaghic infarcts throughout the left frontal/ temporal/ & parietal areas; Left ICA is occluded (reconstitution of flow in supraclinoid region appears inadeq), mod stenosis of Right ICA cavernous segment; occluded right vertebral and right PICA; high grade stenosis of Left vertebral art & mod  narrowing of prox basilar art- see full report!  Subseq CT Angio Head & Neck showed prev Left CAE markedly attenuated & occluded at skullbase/ petrous segment of the left ICA, diminutive right vertebral that occludes at the skullbase, dominant left vertebral w/ hi-grade stenosis distally, diffuse atherosclerotic changes... 2DEcho was neg for embolic source... He was placed back on ASA/Plavix & no intervention recommended, reminded about need for strict regulation of BP, Chol, DM... Speech [place]/ DrSethi (he has not returned)..Marland KitchenMarland Kitchen   2) Adm 12/27- 04/13/14 by [facility] w/ confusion, ataxia, & speech difficulty w/ BS found to be 666 (this is when it was discovered that he was taking his own meds without supervision & several meds hadn't been filled in months); he had an Enterococcus UTI- treated w/ Augmentin; repeat MRI/MRA Brain w/o change from Nov; [facility]- cerebral angiography performed and attempted revasc of the left ICA was attempted but unsuccessful, he was disch home on same meds (ASA/Plavix)- see below- w/ supervision of all meds from wife; Lisinopril was held & they rec keeping BP in the 130-160 range...     We reviewed the following medical problems during today's office visit >>     OSA> he's been off CPAP x yrs & denies sleep disordered breathing, denies snoring, feels he rests well, & wakes refreshed w/o daytime hypersomnolence but he takes a nap every day...    Borderline HBP> not  currently on meds, just diet controlled; BP= 132/80 & reminded to avoid sodium & get weight down; he is essentially asymptomatic w/o CP, palpit, SOB, edema, etc...    Cerebrovasc dis> back on his ASA81 & Plavix75 (he had stopped on his own- now supervised by wife); known severe extracranial cerebrovasc dis w/ left CAE 2000 by [facility] & known severe bilat cavernous ICA stenoses (occluded left); he is also s/p vertebral art angioplasty x2 by Azell Der w/  known basilar art occlusion & severe intracranial atherosclerotic dis; Scans 11/15 revealed acute watershed infarcts in left frontal/ temporal/ parietal lobes & old infarcts in cerebellum & thalamus; DrTDeveshwar attempted intervention to left carotid system but this was reported as unsuccessful (unsucceeful attempt at revascularization of occluded LT ICA cavernous segment, multiple collaterals noted in the LT ICA cav occluded segment)...    Chol> on Lip80 now + diet; FLP 12/15 on Simva40 w/ ?compliance showed TChol 175, TG 105, HDL 41, LDL 113 => changed to Atorva80.    DM> on Lantus15u now, Metform500-2Bid, Diabeta5AM, Onglyza5PM; compliance w/ meds was poor when taking his own meds; Labs 12/15 showed BS=146-665, A1c=9.3 => now all supervised by wife...    Overweight> weight = 181# and BMI~29; we reviewed diet, exercise, wt reduction strategies...    GI- GERD, divertics> on Protonix40 daily; he had EGD 11/13 showing HH, chr GERD, stricture dil w/ 41F Maloney dilator, erosive gastritis was HPylori neg & treated w/ daily PPI rx.    GU- Hx horseshoe kidney, renal cell cancer, nephrolithiasis> right side of horseshoe kid removed 1994 (RCCa); followed by Urology for yrs & no recurrence; Creat in the 1.1-1.5 range; PSA in the 1-3 range...    DJD, Gout, LBP> on Allopurinol300, Tramadol50, Nortrip 54m; followed by DrElsner & DrHarkins on ESI injections & Nortrip rx, last ?June2014 & stable at present, prev mowing, yard work, cBarista etc...    Neuro- memory impairment> on ASA81 & Plavix75, plus MVI etc; he has severe intracranial atherosclerotic dis, chr cerebellar infarcts on prev scans, & new watershed infarcts on left 11/15; memory problems felt to be mild cognitive impairment by DrSethi on eval 3/14, vascular dementia in light of numerous sm infarcts on scans...     Anxiety> on Xanax0.5 prn & Zoloft50...  We reviewed prob list, meds, xrays and labs> see below for updates >>   XRays, Scans,  Angiograpy, Labs from 11-12/20015 all reviewed w/ pt & wife...  ~  May 25, 2014:  136moOV & here for DM recheck> on Lantus15, Metform500-2Bid, Diabeta5Qam, Onglyza5Qpm; all meds supervised by wife now; they state accuchecks are up & down (55-95 on the low, and 190-350 on the high);  Labs today showed BS=96, A1c improved to 7.6 => we decided to continue same meds & diet, ROV 3 months...  In addition his BP is sl elev 170/80 by nurse and 150/80 by me; wife states that Neuro wanted his BP sl higher due to severe vasc dis & perfusion concerns;  We decided to add LOSARTAN50 for his BP & vasc dis;  Continue other meds the same...   ~  Aug 23, 2014:  71m79moV & Nayan reports doing well, no complaints or concerns;  Wife is very concerned regarding his memory & behavior changes and she is sched forback surg soon; requesting Neuro 2nd opinion for help w/ these problems... We reviewed the following medical problems during today's office visit >>     HBP> we added Losar50 last OV; BP= 144/76 & reminded to  avoid sodium & get weight down; he is essentially asymptomatic w/o CP, palpit, SOB, edema, etc...    Cerebrovasc dis> on his ASA81 & Plavix75 (he had stopped on his own- now supervised by wife); known severe extracranial cerebrovasc dis w/ left CAE 2000 by [facility] & known severe bilat cavernous ICA stenoses (occluded left); he is also s/p vertebral art angioplasty x2 by Azell Der w/ known basilar art occlusion & severe intracranial atherosclerotic dis; Scans 11/15 revealed acute watershed infarcts in left frontal/ temporal/ parietal lobes & old infarcts in cerebellum & thalamus; DrTDeveshwar attempted intervention to left carotid system but this was reported as unsuccessful (unsucceeful attempt at revascularization of occluded LICA cavernous segment w/ multiple collaterals noted)...    Chol> on Lip80 now + diet; FLP 5/16 showed TChol 113, TG 51, HDL 48, LDL 54 => much improved, continue the same.    DM> on  Lantus15u now, Metform500-2Bid, Diabeta5AM, Onglyza5PM; compliance w/ meds was poor but wife supervises now; Labs 5/16 shows BS=130, A1c=6.8 => much improved, continue same.    GU- Hx horseshoe kidney, renal cell cancer, nephrolithiasis> right side of horseshoe kid removed 1994 (RCCa); followed by Urology for yrs & no recurrence; Creat in the 1.1-1.5 range; PSA in the 1-3 range...    Neuro- memory impairment> on ASA81 & Plavix75, plus MVI etc; he has severe intracranial atherosclerotic dis, chr cerebellar infarcts on prev scans, & new watershed infarcts on left 11/15; memory problems felt to be mild cognitive impairment by DrSethi on eval 3/14, vascular dementia in light of numerous sm infarcts on scans; wife wants 2nd opinion Neuro & we will refer...    Anxiety/Depression> on Xanax0.5 prn, Pamelor25,  & Zoloft50...     B12 deficiency> Labs 5/16 showed B12 level = 196 & rec to add 10617mg VitB12 OTC to his MVI daily... We reviewed prob list, meds, xrays and labs> see below for updates >>   LABS 5/16:  FLP- at goals on Atorva80;  Chems- ok w/ BS=130, A1c=6.8, Cr=1.27;  CBC- ok w/ Hg=12.4;  B12 level= 196...  PLAN>> BP & DM control much improved on current meds w/ wife supervising his meds Rx; FLP looks good on Atorva80; severe vasc dis w/ mult interventions and prob multi-infarct dementia- we will refer to Neuro for 2nd opinion per request; Note Vit B12 is low & we will add 100102m B12 to his vitamin regimen...  ~  December 31, 2014:  17m42moV & add-on appt requested for "congestion"; pt had some difficulty remembering why he was here, then noted 2d hx sinus congestion, drainage, blowing sl beige mucus, assoc w/ cough prod of a sm amt of similar beige sput; denies f/c/s, CP, SOB...     BP is better regulated w/ him on the Losar50; BP= 120/60 today & he denies CP, palpit, SOB, edema, etc...    [facility] his last visit [facility] noting memory loss, MOCA=20/30, hx of severe vasc  dis etc; they started Aricept5=>10, MVI, and B12 supplement; also rec physical exercise & brain stimulation thru puzzles etc.    He also had a f/u w/ DrDeveshwar from IR 7/16> MRI/MRA of the Brain revealed chronic ischemicchanges stable from his prev scans, neg for acute infarcts; chr occlusion of left ICA, mod-severe stenosis of the right cavernous carotid, occlusion of the distal right vertebral art, mod-severe stenosisof the distal left vertebral as well; he is continuing to follow the pt... EXAM showed Afeb, VSS w/ BP=120/60;  HEENT- neg, mallampati2;  Chest- clear w/o w/r/r;  Heart- RR, w/o m/r/g;  Abd- soft, neg;  Ext- neg w/o c/c/e... IMP/PLAN>>  Peter Nicholson appears to have a mild URI w/ some sinus congestion & drainage;  He is a diabetic so we will try to avoid Pred and treat w/ ZPAK, McDuffie, DELSYM, OTC antihist prn as well;  They request TRAMADOL 79m for pain- ok... He has a regular ROV sched for Nov.   ~  February 25, 2015:  269moOV & medical recheck> Prev congestion from 9/16 OV has resolved w/ Zpak, Mucinex, Delsym; currently reports doing satis but wife reports BS up&down 5-200; he is too sedentary- not exercising at all, just watching TV, wife says he won't read/ work puzzles/ do anything substantive regarding physical or mental exercises... We reviewed the following medical problems during today's office visit >>     OSA> he's been off CPAP x yrs & denies sleep disordered breathing, denies snoring, feels he rests well, & wakes refreshed w/o daytime hypersomnolence but he takes a nap every day...    HBP> on Losartan50; BP= 126/68 & reminded to avoid sodium & get weight down; he is essentially asymptomatic w/o CP, palpit, SOB, edema, etc...    Cerebrovasc dis> on his ASA81 & Plavix75 (meds supervised by wife); known severe extracranial cerebrovasc dis [facility]/ [facility] known severe bilat cavernous ICA stenoses (occluded left); he is also s/p vertebral art angioplasty x2 by DrAzell Derw/ known basilar art occlusion & severe intracranial atherosclerotic dis; Scans 11/15 revealed acute watershed infarcts in left frontal/ temporal/ parietal lobes & old infarcts in cerebellum & thalamus; DrTDeveshwar attempted intervention to left carotid system but this was reported as unsuccessful (unsucceeful attempt at revascularization of occluded LICA cavernous segment w/ multiple collaterals noted)...    Chol> on Lip80 now + diet; FLP 5/16 showed TChol 113, TG 51, HDL 48, LDL 54 => much improved, continue the same.    DM> on Lantus10u now, Metform500-2Bid, Diabeta5AM, Onglyza5PM; compliance w/ meds was poor but wife supervises now; Labs 5/16 shows BS=130, A1c=6.8 => Labs 11/16 showed BS=226, A1c=6.7, continue same meds, better diet.     GU- Hx horseshoe kidney, renal cell cancer, nephrolithiasis> right side of horseshoe kid removed 1994 (RCCa); followed by Urology for yrs & no recurrence; Creat in the 1.1-1.5 range; PSA in the 1-3 range...    Neuro- memory impairment> on ASA81 & Plavix75, plus MVI etc; he has severe intracranial atherosclerotic dis, chr cerebellar infarcts on prev scans, & new watershed infarcts on left 11/15; memory problems felt to be mild cognitive impairment by DrSethi on eval 3/14, vascular dementia in light of numerous sm infarcts on scans; wife wants 2nd opinion Neuro & [facility] noting memory loss, MOCA=20/30, hx of severe vasc dis etc; they started Aricept5=>10, MVI, and B12 supplement; also rec physical exercise & brain stimulation thru puzzles etc.    Anxiety/Depression> on Xanax0.5 prn & Zoloft50...     B12 deficiency> Labs 5/16 showed B12 level = 196 & rec to add 10004mVitB12 OTC to his MVI daily; Labs 11/16 showed B12=426... EXAM shows Afeb, VSS w/ BP=126/68 & O2sat=98% on RA;  HEENT- neg, mallampati2;  Chest- clear w/o w/r/r;  Heart- RR, w/o m/r/g;  Abd- soft, neg;  Ext- neg w/o c/c/e...  LABS 02/25/15 showed BS=226, A1c=6.7, K=5.5,  Cr=1.15, B12=426... IMP/PLAN>>  Problems as listed- control of BP, BS is reasonable & could be better w/ diet/ exercise/ wt reduction; reviewed  w/ pt & wife, given 2016 Flu vaccine today.          Problem List:     Hearing Loss >> he had ENT eval by [facility] 3/15 w/ sensorineural hearing loss & rec to consider hearing aides...   OBSTRUCTIVE SLEEP APNEA (ICD-327.23) - s/p split night sleep study 11/08- showing an AHI of 19, assoc w/ an O2 sat down to 78%... during the CPAP trial he titrated up to a pressure of 11cm but never ret to REM sleep so optimal pressure is still ???...he uses [facility] for his CPAP needs... ~  12/10: he tells me that he has stopped the CPAP, that he sleeps better without it, not snoring/ rests well, & wakes feeling refreshed... he refuses Sleep Med re-eval etc... ~  11/14: he's been off CPAP x yrs & denies sleep disordered breathing, denies snoring, feels he rests well, & wakes refreshed w/o daytime hypersomnolence, he naps daily... ~  2015> he reports stable & denies sleep disordered breathing...  Hx of HYPERTENSION >>   ~  not currently on medications =>  ~  6/12: BP= 136/80 off meds & denies HA, visual changes, CP, palipit, dizziness, syncope, dyspnea, edema, etc... he states that home BP checks are "OK". ~  CXR 11/12 showed borderline heart size, tort Ao, clear lungs w/ ?underlying COPD, NAD...  ~  1/13: he was sent home from the [place] on Lisinopril & Lasix; both stopped 1/13 by TP w/ renal insuffic & elev K+; subseq improved & BP stable... ~  2/13: BP= 112/68 & feeling weak etc...  3/13:  BP= 140/72 & feeling some better... ~  6/13:  BP= 138/78 & feeling much better; denies CP, palpit, ch in SOB or edema... ~  10/13:  BP= 140/82 & he is c/o reflux & dysphagia today, no CP/ palpit/ SOB/ edema... ~  11/14: not currently on meds, just diet controlled; BP= 140/86 & reminded to avoid sodium & get weight down; he is essentially asymptomatic w/o CP,  palpit, SOB, edema, etc. ~  2/15: still not on meds, just diet controlled; BP= 124/76 & he remains asymptomatic...  ~  8/15: not currently on meds, just diet controlled; BP= 142/84 & reminded to avoid sodium & get weight down; he is essentially asymptomatic w/o CP, palpit, SOB, edema, etc ~  1/16: still not on meds, just diet controlled; BP= 132/80 & Neuro has rec BP range of 130-160 to aid perfusion due to his severe cerebrovasc dis... ~  2/16: BP 170/80 => recheck 150/80 range; we decided to add Losartan $RemoveBefo'50mg'cwageCLZypy$ /d for BP & vasc dis... ~  5/16: on Losartan50; BP= 144/76 & he says he's asymptomatic, continue same...  CEREBROVASCULAR DISEASE (ICD-437.9) - stable on ASA $Remo'325mg'EJocW$ /d & PLAVIX $Remove'75mg'gsAhAHH$ /d per DrDeveshwar/ DrLawson/ DrReynolds... he has severe extracranial cerebrovasc disease- esp in the post circulation, and he is s/p vertebral art angioplasty x 2, and w/ a known basilar art occlusion- prev on Coumadin controlled by Neurology (switched to ASA/ Plavix in 2010)... also followed by Sheryn Bison and DrDeveshwar for IR... he had a left CAE in 6/00 by [facility]... ~  CDoppler 2/07 showed 0-39% bilat ICA stenoses, s/p left CAE w/ DPA... ~  eval 3/10 by Sheryn Bison w/ CDopplers that showed similar patency w/ 0-39% bilat ICA stenoses; due to the 10 yr interval DrLawson did not feel he needed any additional follow up. ~  12/10: continued f/u DrDevershwar IR w/ MRI/ MRAs showing bilat carotid siphon region atherosclerosis & narrowing (  unchanged), and markedly diseased distal left vertebral & prox basilar art narrowing (no change)... ~  11/12: CTA head & neck showed similar severe dis w/ mod to severe bilat cavernous ICA stenoses due to extensive atherosclerosis; severe distal vertebral atherosclerosis- distal right occluded, distal left w/ high grade stenosis; mod to severe bilat PCA stenoses; chr cerebellar infarcts, no acute infarcts. ~  11/14: on ASA81 & Plavix75; known severe extracranial cerebrovasc dis w/ left CAE  2000 by DrLawson & known severe bilat cavernous ICA stenoses; he is also s/p vertebral art angioplasty x2 by DrTDeveshwar w/ known basilar art occlusion ~  8/15: on ASA81 & Plavix75; known severe extracranial cerebrovasc dis w/ left CAE 2000 by DrLawson & known severe bilat cavernous ICA stenoses; he is also s/p vertebral art angioplasty x2 by DrTDeveshwar w/ known basilar art occlusion, he endorses medication compliance & denies cerebral ischemic symptoms... ~  11/15: Hosp by [facility] when he presented w/ dysarthria; hx severe vasc dis w/ known severe extracranial cerebrovasc dis w/ left CAE 2000 by [facility] & known severe bilat cavernous ICA stenoses; he is also s/p vertebral art angioplasty x2 by DrTDeveshwar w/ known basilar art occlusion; he was taking meds on his own & not monitored by family- he had stopped his Plavix (confused, didn't remember), ?DM meds & Simva; Hosp eval included CT Head & MRI/MRA Brain showing old cerebellar & thalamic infarcts and small vessel dis, acute sm nonhemorrhaghic infarcts throughout the left frontal/ temporal/ & parietal areas; Left ICA is occluded (reconstitution of flow in supraclinoid region appears inadeq), mod stenosis of Right ICA cavernous segment; occluded right vertebral and right PICA; high grade stenosis of Left vertebral art & mod narrowing of prox basilar art- see full report!  Subseq CT Angio Head & Neck showed prev Left CAE markedly attenuated & occluded at skullbase/ petrous segment of the left ICA, diminutive right vertebral that occludes at the skullbase, dominant left vertebral w/ hi-grade stenosis distally, diffuse atherosclerotic changes... 2DEcho was neg for embolic source... He was placed back on ASA/Plavix & no intervention recommended, reminded about need for strict regulation of BP, Chol, DM... Speech improved in [place] for outpt f/u w/ DrSethi (he did not return)... ~  12/15: Hosp by [facility] w/ confusion, ataxia, & speech difficulty w/ BS found to  be 666 (this is when it was discovered that he was taking his own meds without supervision & several meds hadn't been filled in months); he had an Enterococcus UTI- treated w/ Augmentin; repeat MRI/MRA Brain w/o change from Nov; he was re-evaluated by [facility] for IR- cerebral angiography performed and attempted revasc of the left ICA was attempted but unsuccessful, he was disch home on same meds (ASA/Plavix) w/ supervision of all meds from wife; Lisinopril was held & they rec keeping BP in the 130-160 range...  ~  5/16: he has severe vascular dis & wife is concerned about memory loss & behavioral changes- continue ASA/ Plavix & she requests Neuro 2nd opinion...  VENOUS INSUFFICIENCY (ICD-459.81) - he knows to avoid sodium, elevate legs, wear support hose...  HYPERCHOLESTEROLEMIA (ICD-272.0) - on CRESTOR $RemoveBe'10mg'ForuMNJok$ /d since Hosp 12/12... ~  [place] 6/08 showed TChol 153, TG 120, HDL 36, LDL 93 ~  FLP 8/09 showed TChol 131, TG 84, HDL 38, LDL 76 ~  2010:  pt was not fasting for blood work... rec to continue Crestor10 + diet efforts. ~  FLP 10/11 showed TChol 210, TG 161, HDL 40, LDL 149... rec to start Crestor $RemoveBefor'20mg'qeNIyvqdrOvC$ /d, he  will decide. ~  FLP 6/12 on Cres20 showed TChol 106, TG 95, HDL 41, LDL 46... Continue same. ~  [place] 12/12 in [place] (on Cres20 PTA) showed TChol 100, TG 101, HDL 37, LDL 43... They decr to Cres10. ~  FLP 3/13 on Cres10 showed TChol 118, TG 67, HDL 44, LDL 61... Continue same, later switched to Simva40 for $$ reasons. ~  FLP 10/14 on Simva40 showed TChol 136, TG 54, HDL 49, LDL 77... Continue same. ~  FLP 12/15 on Simva40 w/ ?compliance showed TChol 175, TG 105, HDL 41, LDL 113 => changed to [place] w/ supervision of all meds by wife. ~  FLP 5/16 on Atorva80 showed TChol 113, TG 51, HDL 48, LDL 54 => much improved, continue the same.  DIABETES MELLITUS (ICD-250.00) >>  ~  on METFORMIN 500mg - 2Bid, GLYBURIDE 5mg Bid, ACTOS 30mg /d, & ONGLYZA 5mg /d... ~  labs 6/08 showed BS= 155, HgA1c= 8.1.Marland Kitchen.  (glyburide incr to 5Bid). ~  labs 11/08 showed BS= 352, HgA1c= 8.1.... (Actos added). ~  labs 8/09 showed BS= 128, HgA1c= 7.1.Marland KitchenMarland Kitchen rec- same meds, better diet, get wt down! ~  labs 2/10 showed BS= 249, A1c= 7.3.... Metform incr to 2Bid. ~  labs 12/10 (nonfasting- on all 3 meds) showed BS= 87, A1c= 6.9 ~  labs 10/11 on Metform1000Bid+Gybur5Bid showed BS= 180, A1c= 8.4.Marland KitchenMarland Kitchen rec to incr GLYBUR10Bid + diet. ~  labs 6/12 on Metform1000Bid+Glybur10Bid showed BS= 196, A1c= 11.2 ?what happened?> he declined insulin & Endocrine consult, opted for max oral meds by adding ACTOS 30mg /d & ONGLYZA 5mg /d... ~  Labs 12/12 in [place] showed BS 150-200+ and A1c= 7.2; covered w/ SSI in hosp & placed back on 4 oral meds on disch from rehab... ~  Labs 1/13 on all 4 meds showed BS= 230, A1c=7.1; we reviewed diet & he will start PT soon... ~  Labs 3/13 on 23meds showed BS= 72, A1c= 6.6 ~  4/13: pt called w/ hypoglycemia & we decreased Glyburide 5mg  from 2Bid to 1Bid... ~  6/13:  Pt on Metform500-2Bid, Glybur5mg Bid, Actos30, Onglyza5; BS at home 70-90 he says & still drinking Boost; Labs showed BS=190  A1c=7.4  ; we decided to cut the Glyburide5mg  Qam only but keep the Metform500-2Bid +Actos +Onglyza... ~  10/13: on his 68meds> BS=171, A1c=7.7 but he's gained 16#; told to STOP the boost, get on diet, get wt down, take all meds every day... ~  10/14: seen by TP> Labs 10/14 showed BS=252, A1c=9.1 & Lantus added to his Metform500-2Bid, Diabeta5, Onglyza5; Lantus10u added... ~  11/14: on Lantus10u, Metform500-2Bid, Diabeta5, Onglyza5, off Actos; f/u labs 11/14 showed BS=182, A1c=8.3; continue same, get wt down. ~  8/15: on Lantus10u, Metform500-2Bid, Diabeta5, Onglyza5; Labs 8/15 showed BS=167, A1c=7.3.Marland KitchenMarland Kitchen Continue meds and get wt down. ~  12/15: now on Lantus15u now, Metform500-2Bid, Diabeta5AM, Onglyza5PM; compliance w/ meds was poor when taking his own meds; Labs 12/15 showed BS=146-665, A1c=9.3 => now all supervised by wife. ~   2/16: on Lantus15u now, Metform500-2Bid, Diabeta5AM, Onglyza5PM; Labs 2/16 showed BS=96, A1c=7.6, therefore continue same meds & diet... ~  5/16: on Lantus15u, Metform500-2Bid, Diabeta5AM, Onglyza5PM; compliance w/ meds was poor but wife supervises now; Labs 5/16 shows BS=130, A1c=6.8 => much improved, continue same.  OVERWEIGHT (ICD-278.02) - he states that he is not on much of a diet & we reviewed low carb/ no sweets/ low fat diet. ~  weight 8/09 = 216# ~  weight 2/10 = 216# ~  weight 12/10 = 212# ~  weight 10/11 =  210# ~  Weight 6/12 = 206# and he admits to not being on much of a diet... ~  Weight 2/13 = non weight bearing still... ~  Weight 3/13 = 193# ~  Weight 6/13 = 191# ~  Weight 10/13 = 206# ~  Weight 11/14 = 198# ~  Weight 2/15 = 193# ~  Weight 1/16 = 181# ~  Weight 5/16 = 189#  GERD (ICD-530.81) - prev on Nexium40, he switched to OTC Prilosec, now [place] 40mg /d due to Plavix Rx; pt had stopped this on his own & asked to restart 10/13. ~  EGD 19/03 showed [place], GERD & esoph stricture dilated... ~  EGD 12/09 by DrPatterson showed HH & severe erosive esophagitis from GERD... ~  10/13: presents w/ incr reflux- w/ dysphagia & food sticking in mid-esoph that occurs every 2-3days & has to vomit to feel better; he had stopped his PPI on his own (?why?), rec to restart the Protonix40 & we will refer to GI for EGD & dilatation... ~  10/13: he saw DrPatterson> EGD 11/13 showed HH, chr GERD, stricture dil w/ 61F [place] dilator, erosive gastritis was HPylori neg & treated w/ daily PPI rx...  ~  He remains on Protonix40/d & doing satis w/o reflux symptoms...  DIVERTICULOSIS OF COLON (ICD-562.10) - note: he was hosp in [place] briefly 12/09 for fecal impaction; prev on [place] but recently noted loose stools on the Metformin 1000mg Bid... ~  colonoscopy 10/03 by DrPatterson showed divertics only... ~  colonoscopy 12/09 by DrPatterson was also normal x for divertics... ~  He  denies n/v, c/d, blood seen...  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 Hx of RENAL CELL CANCER (ICD-189.0) - he had a partial nephrectomy of the right side of his horseshoe kidney in 1994 due to renal cell ca... still followed by [place] yearly by his hx but we don't have any recent notes... ~  labs 10/11 showed PSA= 0.95 ~  Labs 3/13 showed BUN=21, Creat=1.1, PSA=2.75 ~  Labs 10/13 showed BUN= 25, Creat= 1.2 ~  Labs 10/14 showed BUN=38, Cr=1.5 (w/ enterococcus UTI);  Labs 11/14 showed BUN=29, Cr=1.2 ~  Labs 8/15 showed BUN= 24, Cr= 1.2 ~  Labs 12/15 showed Cr= 1.1-1.4 ~  Labs 2/16 showed BUN= 18, Cr= 1.12  Hx of NEPHROLITHIASIS (ICD-592.0) - remote hx of kidney stones followed by DrPeterson...  DEGENERATIVE JOINT DISEASE (ICD-715.90) &  GOUT (ICD-274.9) - on ALLOPURINOL 300mg /d... ~  labs 10/11 showed URIC= 4.7 ~  He was [facility] 11/26 - 03/27/11 after fall from ladder cleaning gutters w/ complex right tibial plat fx- ORIF, 2 operations by DrHardy, & hosp course complic by ileus, TIA, electrolyte abn, etc;  Event disch to Rehab & he was attended by [facility]...  BACK PAIN, LUMBAR (ICD-724.2) - known degenerative spondylitic disease at L4-5 and L5-S1 prev followed by [facility] (now DrElsner & DrHarkins)... ~  6/12: he reports further back pain w/ eval by Chiropractor showing L4 disc prob & regular adjustments; but now notes incr pain & he requests referral to Neurosurg for further eval ==> seen by DrElsner, then DrHarkins for shots which has helped... ~  2014: he has continued f/u w/ DrHarkins/ [facility] & received periodic ESI injections for his Lumbar DDD; they added NORTRIPTYLINE 25mg Qhs & slowly incr the dose  NEURO >> Hx Left Hemispheric TIA in 11/12 & MEMORY IMPAIRMENT ~  3/14:  He had Neuro eval DrSethi> he did Labs,  EEG, MRI Brain, & MRA Brain & Neck; Rec to take 2 FishOil caps daily, CerefolinNAC daily, work puzzles etc,  continue Plavix & strict control of BP, Chol, DM; they did not start Aricept... ~  11/15 & 12/15> see above> Hosp w/ wastershed strokes left frontal/ temporal/ parietal areas when he wasn't taking his Plavix; now all meds supervised by wife; he had MRIs, CT Angio, and Arteriograms w/ attempted intervention on left by DrTDeveshwar- unsuccessful & rec for continued ASA/ Plavix, & control on BP, Chol, DM w/ BP in the 130-160 range... 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  ANXIETY (ICD-300.00) - uses Zolloft50 & ALPRAZOLAM 0.$RemoveBefore'5mg'EunalruVvFHNX$  as needed.  B12 DEFICIENCY >>  ~  5/16:  Labs showed B12 = 196 & rec to start 1073mcg by mouth daily & we will f/i labs...   Past Surgical History  Procedure Laterality Date  . Incisional hernia repair  1/96    Dr.Leone  . Nasal sinus surgery  12/96    Dr.Redman  . Cholecystectomy  9/04    Dr. Rebekah Chesterfield  . 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 Carotid endarterectomy  09/2000    Dr.Lawson  . Veterbral art angioplasty      x2  . Small intestine surgery    . External fixation leg  03/10/2011    Procedure: EXTERNAL FIXATION LEG;  Surgeon: Rozanna Box;  Location: [place];  Service: Orthopedics;  Laterality: Right;  . Orif tibia plateau  03/24/2011    Procedure: OPEN REDUCTION INTERNAL FIXATION (ORIF) TIBIAL PLATEAU;  Surgeon: Rozanna Box;  Location: [place];  Service: Orthopedics;  Laterality: Right;  . Radiology with anesthesia N/A 04/12/2014    Procedure: ANGIOPLASTY;  Surgeon: Rob Hickman, MD;  Location: [place];  Service: Radiology;  Laterality: N/A;    Outpatient Encounter Prescriptions as of 02/25/2015  Medication Sig  . ALPRAZolam (XANAX) 0.5 MG tablet TAKE 1/2-1 TABLET BY MOUTH 3 TIMES DAILY AS NEEDED FOR NERVES  . aspirin EC 81 MG EC tablet Take 1 tablet (81 mg total) by mouth daily.  Marland Kitchen atorvastatin (LIPITOR) 80 MG  tablet TAKE 1 TABLET BY MOUTH EVERY DAY AT 6PM  . clopidogrel (PLAVIX) 75 MG tablet Take 1 tablet (75 mg total) by mouth daily.  Marland Kitchen donepezil (ARICEPT) 10 MG tablet Take 1/2 tablet daily for 1 week, then increase to 1 tablet daily  . glyBURIDE (DIABETA) 5 MG tablet TAKE ONE TABLET BY MOUTH EVERY MORNING WITH BREAKFAST  . LANTUS SOLOSTAR 100 UNIT/ML Solostar Pen INJECT 10 UNITS INTO THE SKIN AT BEDTIME. (Patient taking differently: 15 units daily)  . losartan (COZAAR) 50 MG tablet TAKE 1 TABLET (50 MG TOTAL) BY MOUTH DAILY.  . metFORMIN (GLUCOPHAGE) 500 MG tablet Take 2 tablets by mouth two times daily  . pantoprazole (PROTONIX) 40 MG tablet Take 1 tablet (40 mg total) by mouth daily.  . saxagliptin HCl (ONGLYZA) 5 MG TABS tablet Take 1 tablet (5 mg total) by mouth daily.  . sertraline (ZOLOFT) 50 MG tablet TAKE 1 TABLET (50 MG TOTAL) BY MOUTH AT BEDTIME.  . traMADol (ULTRAM) 50 MG tablet Take 1 tablet (50 mg total) by mouth 3 (three) times daily as needed.  . [DISCONTINUED] allopurinol (ZYLOPRIM) 300 MG tablet TAKE 1 TABLET (300 MG TOTAL) BY MOUTH DAILY. (Patient not taking: Reported on 12/31/2014)  . [DISCONTINUED]  azithromycin (ZITHROMAX Z-PAK) 250 MG tablet Use as directed (Patient not taking: Reported on 02/25/2015)  . [DISCONTINUED] nortriptyline (PAMELOR) 25 MG capsule Take 25 mg by mouth at bedtime.   No facility-administered encounter medications on file as of 02/25/2015.    Allergies  Allergen Reactions  . Dilaudid [Hydromorphone Hcl] Other (See Comments)    hallucinations  . Fentanyl Other (See Comments)    hallucinations    Current Medications, Allergies, Past Medical History, Past Surgical History, Family History, and Social History were reviewed in [employer] record.    Review of Systems        See HPI - all other systems neg except as noted...  The patient complains of dyspnea on exertion.  The patient denies anorexia, fever, weight loss, weight  gain, vision loss, decreased hearing, hoarseness, chest pain, syncope, peripheral edema, prolonged cough, headaches, hemoptysis, abdominal pain, melena, hematochezia, severe indigestion/heartburn, hematuria, incontinence, muscle weakness, suspicious skin lesions, transient blindness, difficulty walking, depression, unusual weight change, abnormal bleeding, enlarged lymph nodes, and angioedema.     Objective:   Physical Exam     WD, WN, 74 y/o WM in NAD...  GENERAL:  Alert & oriented; pleasant & cooperative... HEENT:  [place]/AT, EOM-wnl, PERRLA, EACs-clear, TMs-wnl, NOSE-clear, THROAT-clear & wnl. NECK:  Supple w/ fairROM; no JVD; s/p left CAE, +bruits; no thyromegaly or nodules palpated; no lymphadenopathy. CHEST:  Clear to P & A; without wheezes/ rales/ or rhonchi heard... HEART:  Regular Rhythm; gr 1/6 SEM without rubs or gallops detected... ABDOMEN:  Soft & nontender; normal bowel sounds; no organomegaly or masses palpated... EXT: right knee deformity s/p ORIF, mod arthritic changes; no varicose veins/ +venous insuffic/ 1+ edema. NEURO:  CN's intact; no focal neuro deficits... DERM:  No lesions noted; no rash etc...  RADIOLOGY DATA:  Reviewed in the EPIC EMR & discussed w/ the patient...  LABORATORY DATA:  Reviewed in the EPIC EMR & discussed w/ the patient...   Assessment & Plan:    BP>  Neuro has wanted his BP to be sl higher than usual for perfusion reasons but BP= 170/80 on arrival & improved to 150/80 w/ rest; we opted to start Losartan $RemoveBefore'50mg'OszAIodSyKDQH$ /d for BP & vasc dis... 5/16> BP= 144/76 today, continue Losar50... 9/16> BP= 120/60 & he remains asymptomatic...  CEREBROVASC Disease & STROKES 11/15 & TIA 12/12>  Known severe cerebrovasc dis & he had TIA while in hosp for fx leg 2012 (he was off plavix at the time); Recurrent prob 11/15 w/ watershed infarcts in left frontal/ temporal/ parietal regions while he was off Plavix (he was taking meds on his own & hadn't been filling the Plavix); s/p  extensive eval w/ MRI/MRA, CT Angio & Arteriogram by Ladene Artist w/ attempt at intervention on left unsuccessful; wife now supervises all meds... He has prob multi-infarct dementia & wife requests Neuro 2nd opinion - we will refer...  NEURO> Mild Cognitive Impairment per DrSethi & wife is concerned re: memory/ vascular dementia; we will arrange for Neuro 2nd opinion...  CHOL>  on Atrova80 now & f/u FLP much improved...  DM>  On Lantus15, Metform, Diabeta, Onglyza & compliance is better w/ wife supervision; Labs 5/16 showed BS=130, A1c=6.8, continue same...  Overweight>  We discussed low carb, low fat wt reducing diet...  GI>  GERD prev controlled w/ Protonix but he stopped on his own (?why?); EGD 11/13 w/ dilatation & reminded to take the Protonix40 every day...  Hx Renal Cell Ca in right side of horseshoe kidney>  removed in 1994 & no known recurrence...  DJD/ Hx Gout/ LBP>  He was seeing chiropractor regularly regarding LBP & then f/u w/ DrElsner,Neurosurg & had ESI by DrHarkins & the shots have helped...  s/p fall w/ right Tibial plateaux fx> 1st ext fixation, 2nd ORIF>  By DrHardy, Ortho; exercise is difficult but very much needed; he's had extensive PT training...  Anxiety>  He remains on Alpraz Prn... Also has [place] on his list of meds...  B12 deficiency>  Labs 5/16 w/ B12 level = 196; rec to start oral B12 supplement ~1097mcg/d & repeat B12 level 11/16= 426   Patient's Medications  New Prescriptions   No medications on file  Previous Medications   ALPRAZOLAM (XANAX) 0.5 MG TABLET    TAKE 1/2-1 TABLET BY MOUTH 3 TIMES DAILY AS NEEDED FOR NERVES   ASPIRIN EC 81 MG EC TABLET    Take 1 tablet (81 mg total) by mouth daily.   ATORVASTATIN (LIPITOR) 80 MG TABLET    TAKE 1 TABLET BY MOUTH EVERY DAY AT 6PM   CLOPIDOGREL (PLAVIX) 75 MG TABLET    Take 1 tablet (75 mg total) by mouth daily.   DONEPEZIL (ARICEPT) 10 MG TABLET    Take 1/2 tablet daily for 1 week, then increase  to 1 tablet daily   GLYBURIDE (DIABETA) 5 MG TABLET    TAKE ONE TABLET BY MOUTH EVERY MORNING WITH BREAKFAST   LANTUS SOLOSTAR 100 UNIT/ML SOLOSTAR PEN    INJECT 10 UNITS INTO THE SKIN AT BEDTIME.   LOSARTAN (COZAAR) 50 MG TABLET    TAKE 1 TABLET (50 MG TOTAL) BY MOUTH DAILY.   METFORMIN (GLUCOPHAGE) 500 MG TABLET    Take 2 tablets by mouth two times daily   PANTOPRAZOLE (PROTONIX) 40 MG TABLET    Take 1 tablet (40 mg total) by mouth daily.   SAXAGLIPTIN HCL (ONGLYZA) 5 MG TABS TABLET    Take 1 tablet (5 mg total) by mouth daily.   SERTRALINE (ZOLOFT) 50 MG TABLET    TAKE 1 TABLET (50 MG TOTAL) BY MOUTH AT BEDTIME.   TRAMADOL (ULTRAM) 50 MG TABLET    Take 1 tablet (50 mg total) by mouth 3 (three) times daily as needed.  Modified Medications   No medications on file  Discontinued Medications   ALLOPURINOL (ZYLOPRIM) 300 MG TABLET    TAKE 1 TABLET (300 MG TOTAL) BY MOUTH DAILY.   AZITHROMYCIN (ZITHROMAX Z-PAK) 250 MG TABLET    Use as directed   NORTRIPTYLINE (PAMELOR) 25 MG CAPSULE    Take 25 mg by mouth at bedtime.

## 2015-02-27 NOTE — Progress Notes (Signed)
Quick Note:  Spoke with pt and notified of results per Dr. Wert. Pt verbalized understanding and denied any questions.  ______ 

## 2015-02-28 DIAGNOSIS — H5203 Hypermetropia, bilateral: Secondary | ICD-10-CM | POA: Diagnosis not present

## 2015-02-28 DIAGNOSIS — E113393 Type 2 diabetes mellitus with moderate nonproliferative diabetic retinopathy without macular edema, bilateral: Secondary | ICD-10-CM | POA: Diagnosis not present

## 2015-02-28 DIAGNOSIS — H2513 Age-related nuclear cataract, bilateral: Secondary | ICD-10-CM | POA: Diagnosis not present

## 2015-03-08 ENCOUNTER — Telehealth: Payer: Self-pay | Admitting: Internal Medicine

## 2015-03-08 NOTE — Telephone Encounter (Signed)
Peter Nicholson Hipp wife Ashyr Cuen called wants appt to see Lenna Gilford ASAP next few days for something of a knot on back. Advised to to go to ER if urgen but triage pls call and give appt

## 2015-03-09 ENCOUNTER — Other Ambulatory Visit: Payer: Self-pay | Admitting: Pulmonary Disease

## 2015-03-11 ENCOUNTER — Ambulatory Visit (INDEPENDENT_AMBULATORY_CARE_PROVIDER_SITE_OTHER): Payer: Medicare Other | Admitting: Pulmonary Disease

## 2015-03-11 VITALS — BP 140/60 | HR 70 | Temp 98.0°F | Wt 188.6 lb

## 2015-03-11 DIAGNOSIS — L723 Sebaceous cyst: Secondary | ICD-10-CM | POA: Diagnosis not present

## 2015-03-11 MED ORDER — CEPHALEXIN 500 MG PO CAPS: 500.0000 mg | ORAL_CAPSULE | Freq: Three times a day (TID) | ORAL | Status: DC

## 2015-03-11 NOTE — Telephone Encounter (Signed)
Called pt. appt scheduled this AM to see SN. nothing further needed

## 2015-03-11 NOTE — Patient Instructions (Signed)
Today we updated your med list in our EPIC system...    Continue your current medications the same...  You have a sebaceous cyst on your back>>    This will need to be cleaned w/ mild soapy water twice daily...    Then pat dry, apply neosporin ointment, and a light dressing (either band-aid or gauze w/ paper tape)  We are starting an antibiotic- KEFLEX (generic) 500mg  one tab three times daily for 7 d...  If is not substantially resolved, the next step is drainage/ removal by a general surgeon...    Call me in 1 week to report how it is doing, and sooner if not responding to treatment.Marland KitchenMarland Kitchen

## 2015-03-31 ENCOUNTER — Other Ambulatory Visit: Payer: Self-pay | Admitting: Pulmonary Disease

## 2015-04-01 ENCOUNTER — Other Ambulatory Visit: Payer: Self-pay | Admitting: Pulmonary Disease

## 2015-04-12 ENCOUNTER — Encounter: Payer: Self-pay | Admitting: Pulmonary Disease

## 2015-04-12 NOTE — Progress Notes (Signed)
Subjective:    Patient ID: Peter Nicholson, male    DOB: 1940/11/19, 74 y.o.   MRN: [id number]  HPI 74 y/o WM here for a follow up visit... he has multiple medical problems as noted below...  ~  SEE PREV EPIC NOTES FOR OLDER DATA >>    Hx OSA but he's been off CPAP x yrs & denies sleep disordered breathing, denies snoring, feels he rests well, & wakes refreshed w/o daytime hypersomnolence but he takes a nap every day...  known severe extracranial cerebrovasc dis w/ left CAE 2000 by [facility] & known severe bilat cavernous ICA stenoses; he is also s/p vertebral art angioplasty x2 by DrTDeveshwar w/ known basilar art occlusion  he has severe intracranial atherosclerotic dis and chr cerebellar infarcts on prev scans; memory problems felt to be mild cognitive impairment by DrSethi on eval 3/14 (but he is clearly high risk for vascular dementia problems)...   LABS 10/14 in Epic> REVIEWED.Marland KitchenMarland Kitchen   LABS 11/14:  Chems- OK x BS=182, A1c=8.3, BUN=29, Cr=1.2.Marland KitchenMarland Kitchen   LABS 8/15:  Chems- ok w/ BS=167, A1c=7.3, Cr=1.2.Marland KitchenMarland Kitchen   ~  April 19, 2014:  75moROV & s/p 2 recent hospitalizations..Marland KitchenMarland Kitchen    1) Adm 11/15- 02/28/14 by [facility] when he presented w/ dysarthria; hx severe vasc dis w/ known severe extracranial cerebrovasc dis w/ left CAE 2000 by DrLawson & known severe bilat cavernous ICA stenoses; he is also s/p vertebral art angioplasty x2 by DrTDeveshwar w/ known basilar art occlusion; he was taking meds on his own & not monitored by family- he had stopped his Plavix (confused, didn't remember), ?DM meds & Simva; Hosp eval included CT Head & MRI/MRA Brain showing old cerebellar & thalamic infarcts and small vessel dis, acute sm nonhemorrhaghic infarcts throughout the left frontal/ temporal/ & parietal areas; Left ICA is occluded (reconstitution of flow in supraclinoid region appears inadeq), mod stenosis of Right ICA cavernous segment; occluded right vertebral and right PICA; high grade stenosis of Left vertebral art & mod  narrowing of prox basilar art- see full report!  Subseq CT Angio Head & Neck showed prev Left CAE markedly attenuated & occluded at skullbase/ petrous segment of the left ICA, diminutive right vertebral that occludes at the skullbase, dominant left vertebral w/ hi-grade stenosis distally, diffuse atherosclerotic changes... 2DEcho was neg for embolic source... He was placed back on ASA/Plavix & no intervention recommended, reminded about need for strict regulation of BP, Chol, DM... Speech improved [place]/u w/ DrSethi (he has not returned)..Marland KitchenMarland Kitchen   2) Adm 12/27- 04/13/14 by [facility] w/ confusion, ataxia, & speech difficulty w/ BS found to be 666 (this is when it was discovered that he was taking his own meds without supervision & several meds hadn't been filled in months); he had an Enterococcus UTI- treated w/ Augmentin; repeat MRI/MRA Brain w/o change from Nov; [facility] performed and attempted revasc of the left ICA was attempted but unsuccessful, he was disch home on same meds (ASA/Plavix)- see below- w/ supervision of all meds from wife; Lisinopril was held & they rec keeping BP in the 130-160 range...     We reviewed the following medical problems during today's office visit >>     OSA> he's been off CPAP x yrs & denies sleep disordered breathing, denies snoring, feels he rests well, & wakes refreshed w/o daytime hypersomnolence but he takes a nap every day...    Borderline HBP> not  currently on meds, just diet controlled; BP= 132/80 & reminded to avoid sodium & get weight down; he is essentially asymptomatic w/o CP, palpit, SOB, edema, etc...    Cerebrovasc dis> back on his ASA81 & Plavix75 (he had stopped on his own- now supervised by wife); known severe extracranial cerebrovasc dis w/ left CAE 2000 by [facility] & known severe bilat cavernous ICA stenoses (occluded left); he is also s/p vertebral art angioplasty x2 by Azell Der w/  known basilar art occlusion & severe intracranial atherosclerotic dis; Scans 11/15 revealed acute watershed infarcts in left frontal/ temporal/ parietal lobes & old infarcts in cerebellum & thalamus; DrTDeveshwar attempted intervention to left carotid system but this was reported as unsuccessful (unsucceeful attempt at revascularization of occluded LT ICA cavernous segment, multiple collaterals noted in the LT ICA cav occluded segment)...    Chol> on Lip80 now + diet; FLP 12/15 on Simva40 w/ ?compliance showed TChol 175, TG 105, HDL 41, LDL 113 => changed to Atorva80.    DM> on Lantus15u now, Metform500-2Bid, Diabeta5AM, Onglyza5PM; compliance w/ meds was poor when taking his own meds; Labs 12/15 showed BS=146-665, A1c=9.3 => now all supervised by wife...    Overweight> weight = 181# and BMI~29; we reviewed diet, exercise, wt reduction strategies...    GI- GERD, divertics> on Protonix40 daily; he had EGD 11/13 showing HH, chr GERD, stricture dil w/ 41F Maloney dilator, erosive gastritis was HPylori neg & treated w/ daily PPI rx.    GU- Hx horseshoe kidney, renal cell cancer, nephrolithiasis> right side of horseshoe kid removed 1994 (RCCa); followed by Urology for yrs & no recurrence; Creat in the 1.1-1.5 range; PSA in the 1-3 range...    DJD, Gout, LBP> on Allopurinol300, Tramadol50, Nortrip 54m; followed by DrElsner & DrHarkins on ESI injections & Nortrip rx, last ?June2014 & stable at present, prev mowing, yard work, cBarista etc...    Neuro- memory impairment> on ASA81 & Plavix75, plus MVI etc; he has severe intracranial atherosclerotic dis, chr cerebellar infarcts on prev scans, & new watershed infarcts on left 11/15; memory problems felt to be mild cognitive impairment by DrSethi on eval 3/14, vascular dementia in light of numerous sm infarcts on scans...     Anxiety> on Xanax0.5 prn & Zoloft50...  We reviewed prob list, meds, xrays and labs> see below for updates >>   XRays, Scans,  Angiograpy, Labs from 11-12/20015 all reviewed w/ pt & wife...  ~  May 25, 2014:  136moOV & here for DM recheck> on Lantus15, Metform500-2Bid, Diabeta5Qam, Onglyza5Qpm; all meds supervised by wife now; they state accuchecks are up & down (55-95 on the low, and 190-350 on the high);  Labs today showed BS=96, A1c improved to 7.6 => we decided to continue same meds & diet, ROV 3 months...  In addition his BP is sl elev 170/80 by nurse and 150/80 by me; wife states that Neuro wanted his BP sl higher due to severe vasc dis & perfusion concerns;  We decided to add LOSARTAN50 for his BP & vasc dis;  Continue other meds the same...   ~  Aug 23, 2014:  71m79moV & Jevonte reports doing well, no complaints or concerns;  Wife is very concerned regarding his memory & behavior changes and she is sched forback surg soon; requesting Neuro 2nd opinion for help w/ these problems... We reviewed the following medical problems during today's office visit >>     HBP> we added Losar50 last OV; BP= 144/76 & reminded to  avoid sodium & get weight down; he is essentially asymptomatic w/o CP, palpit, SOB, edema, etc...    Cerebrovasc dis> on his ASA81 & Plavix75 (he had stopped on his own- now supervised by wife); known severe extracranial cerebrovasc dis w/ left CAE 2000 by [facility] & known severe bilat cavernous ICA stenoses (occluded left); he is also s/p vertebral art angioplasty x2 by Azell Der w/ known basilar art occlusion & severe intracranial atherosclerotic dis; Scans 11/15 revealed acute watershed infarcts in left frontal/ temporal/ parietal lobes & old infarcts in cerebellum & thalamus; DrTDeveshwar attempted intervention to left carotid system but this was reported as unsuccessful (unsucceeful attempt at revascularization of occluded LICA cavernous segment w/ multiple collaterals noted)...    Chol> on Lip80 now + diet; FLP 5/16 showed TChol 113, TG 51, HDL 48, LDL 54 => much improved, continue the same.    DM> on  Lantus15u now, Metform500-2Bid, Diabeta5AM, Onglyza5PM; compliance w/ meds was poor but wife supervises now; Labs 5/16 shows BS=130, A1c=6.8 => much improved, continue same.    GU- Hx horseshoe kidney, renal cell cancer, nephrolithiasis> right side of horseshoe kid removed 1994 (RCCa); followed by Urology for yrs & no recurrence; Creat in the 1.1-1.5 range; PSA in the 1-3 range...    Neuro- memory impairment> on ASA81 & Plavix75, plus MVI etc; he has severe intracranial atherosclerotic dis, chr cerebellar infarcts on prev scans, & new watershed infarcts on left 11/15; memory problems felt to be mild cognitive impairment by DrSethi on eval 3/14, vascular dementia in light of numerous sm infarcts on scans; wife wants 2nd opinion Neuro & we will refer...    Anxiety/Depression> on Xanax0.5 prn, Pamelor25,  & Zoloft50...     B12 deficiency> Labs 5/16 showed B12 level = 196 & rec to add 10617mg VitB12 OTC to his MVI daily... We reviewed prob list, meds, xrays and labs> see below for updates >>   LABS 5/16:  FLP- at goals on Atorva80;  Chems- ok w/ BS=130, A1c=6.8, Cr=1.27;  CBC- ok w/ Hg=12.4;  B12 level= 196...  PLAN>> BP & DM control much improved on current meds w/ wife supervising his meds Rx; FLP looks good on Atorva80; severe vasc dis w/ mult interventions and prob multi-infarct dementia- we will refer to Neuro for 2nd opinion per request; Note Vit B12 is low & we will add 100102m B12 to his vitamin regimen...  ~  December 31, 2014:  17m42moV & add-on appt requested for "congestion"; pt had some difficulty remembering why he was here, then noted 2d hx sinus congestion, drainage, blowing sl beige mucus, assoc w/ cough prod of a sm amt of similar beige sput; denies f/c/s, CP, SOB...     BP is better regulated w/ him on the Losar50; BP= 120/60 today & he denies CP, palpit, SOB, edema, etc...    After his last visit [facility] noting memory loss, MOCA=20/30, hx of severe vasc  dis etc; they started Aricept5=>10, MVI, and B12 supplement; also rec physical exercise & brain stimulation thru puzzles etc.    He also had a f/u w/ DrDeveshwar from IR 7/16> MRI/MRA of the Brain revealed chronic ischemicchanges stable from his prev scans, neg for acute infarcts; chr occlusion of left ICA, mod-severe stenosis of the right cavernous carotid, occlusion of the distal right vertebral art, mod-severe stenosisof the distal left vertebral as well; he is continuing to follow the pt... EXAM showed Afeb, VSS w/ BP=120/60;  HEENT- neg, mallampati2;  Chest- clear w/o w/r/r;  Heart- RR, w/o m/r/g;  Abd- soft, neg;  Ext- neg w/o c/c/e... IMP/PLAN>>  Victoriano appears to have a mild URI w/ some sinus congestion & drainage;  He is a diabetic so we will try to avoid Pred and treat w/ ZPAK, McDuffie, DELSYM, OTC antihist prn as well;  They request TRAMADOL 79m for pain- ok... He has a regular ROV sched for Nov.   ~  February 25, 2015:  269moOV & medical recheck> Prev congestion from 9/16 OV has resolved w/ Zpak, Mucinex, Delsym; currently reports doing satis but wife reports BS up&down 5-200; he is too sedentary- not exercising at all, just watching TV, wife says he won't read/ work puzzles/ do anything substantive regarding physical or mental exercises... We reviewed the following medical problems during today's office visit >>     OSA> he's been off CPAP x yrs & denies sleep disordered breathing, denies snoring, feels he rests well, & wakes refreshed w/o daytime hypersomnolence but he takes a nap every day...    HBP> on Losartan50; BP= 126/68 & reminded to avoid sodium & get weight down; he is essentially asymptomatic w/o CP, palpit, SOB, edema, etc...    Cerebrovasc dis> on his ASA81 & Plavix75 (meds supervised by wife); known severe extracranial cerebrovasc dis [facility]/ [facility] known severe bilat cavernous ICA stenoses (occluded left); he is also s/p vertebral art angioplasty x2 by DrAzell Derw/ known basilar art occlusion & severe intracranial atherosclerotic dis; Scans 11/15 revealed acute watershed infarcts in left frontal/ temporal/ parietal lobes & old infarcts in cerebellum & thalamus; DrTDeveshwar attempted intervention to left carotid system but this was reported as unsuccessful (unsucceeful attempt at revascularization of occluded LICA cavernous segment w/ multiple collaterals noted)...    Chol> on Lip80 now + diet; FLP 5/16 showed TChol 113, TG 51, HDL 48, LDL 54 => much improved, continue the same.    DM> on Lantus10u now, Metform500-2Bid, Diabeta5AM, Onglyza5PM; compliance w/ meds was poor but wife supervises now; Labs 5/16 shows BS=130, A1c=6.8 => Labs 11/16 showed BS=226, A1c=6.7, continue same meds, better diet.     GU- Hx horseshoe kidney, renal cell cancer, nephrolithiasis> right side of horseshoe kid removed 1994 (RCCa); followed by Urology for yrs & no recurrence; Creat in the 1.1-1.5 range; PSA in the 1-3 range...    Neuro- memory impairment> on ASA81 & Plavix75, plus MVI etc; he has severe intracranial atherosclerotic dis, chr cerebellar infarcts on prev scans, & new watershed infarcts on left 11/15; memory problems felt to be mild cognitive impairment by DrSethi on eval 3/14, vascular dementia in light of numerous sm infarcts on scans; wife wants 2nd opinion Neuro & [facility] noting memory loss, MOCA=20/30, hx of severe vasc dis etc; they started Aricept5=>10, MVI, and B12 supplement; also rec physical exercise & brain stimulation thru puzzles etc.    Anxiety/Depression> on Xanax0.5 prn & Zoloft50...     B12 deficiency> Labs 5/16 showed B12 level = 196 & rec to add 10004mVitB12 OTC to his MVI daily; Labs 11/16 showed B12=426... EXAM shows Afeb, VSS w/ BP=126/68 & O2sat=98% on RA;  HEENT- neg, mallampati2;  Chest- clear w/o w/r/r;  Heart- RR, w/o m/r/g;  Abd- soft, neg;  Ext- neg w/o c/c/e...  LABS 02/25/15 showed BS=226, A1c=6.7, K=5.5,  Cr=1.15, B12=426... IMP/PLAN>>  Problems as listed- control of BP, BS is reasonable & could be better w/ diet/ exercise/ wt reduction; reviewed  w/ pt & wife, given 2016 Flu vaccine today.  ~  March 11, 2015:  2wk add-on appt requested to check sebaceous cyst on his back> quarter sized, sl red, min tender in mid-back; showed up over the past 1-2 wks he says, no drainage, denies f/c/s, never had prev cysts... EXAM shows Afeb, VSS, O2sat=99% on RA;  Sebaceous cyst in mid back- sebum expressed, not foul, no adenopathy, etc... REC>>  Clean w/ mild soapy water Bid, cover w/ neosporin/ dry gauze/ & paper tape, Rx Keflex 500 Tis x7d and call if not responding for referral to general surg for drainage...          Problem List:     Hearing Loss >> he had ENT eval by [facility] 3/15 w/ sensorineural hearing loss & rec to consider hearing aides...   OBSTRUCTIVE SLEEP APNEA (ICD-327.23) - s/p split night sleep study 11/08- showing an AHI of 19, assoc w/ an O2 sat down to 78%... during the CPAP trial he titrated up to a pressure of 11cm but never ret to REM sleep so optimal pressure is still ???...he uses [facility] for his CPAP needs... ~  12/10: he tells me that he has stopped the CPAP, that he sleeps better without it, not snoring/ rests well, & wakes feeling refreshed... he refuses Sleep Med re-eval etc... ~  11/14: he's been off CPAP x yrs & denies sleep disordered breathing, denies snoring, feels he rests well, & wakes refreshed w/o daytime hypersomnolence, he naps daily... ~  2015> he reports stable & denies sleep disordered breathing...  Hx of HYPERTENSION >>   ~  not currently on medications =>  ~  6/12: BP= 136/80 off meds & denies HA, visual changes, CP, palipit, dizziness, syncope, dyspnea, edema, etc... he states that home BP checks are "OK". ~  CXR 11/12 showed borderline heart size, tort Ao, clear lungs w/ ?underlying COPD, NAD...  ~  1/13: he was sent home from the [place]  on Lisinopril & Lasix; both stopped 1/13 by TP w/ renal insuffic & elev K+; subseq improved & BP stable... ~  2/13: BP= 112/68 & feeling weak etc...  3/13:  BP= 140/72 & feeling some better... ~  6/13:  BP= 138/78 & feeling much better; denies CP, palpit, ch in SOB or edema... ~  10/13:  BP= 140/82 & he is c/o reflux & dysphagia today, no CP/ palpit/ SOB/ edema... ~  11/14: not currently on meds, just diet controlled; BP= 140/86 & reminded to avoid sodium & get weight down; he is essentially asymptomatic w/o CP, palpit, SOB, edema, etc. ~  2/15: still not on meds, just diet controlled; BP= 124/76 & he remains asymptomatic...  ~  8/15: not currently on meds, just diet controlled; BP= 142/84 & reminded to avoid sodium & get weight down; he is essentially asymptomatic w/o CP, palpit, SOB, edema, etc ~  1/16: still not on meds, just diet controlled; BP= 132/80 & Neuro has rec BP range of 130-160 to aid perfusion due to his severe cerebrovasc dis... ~  2/16: BP 170/80 => recheck 150/80 range; we decided to add Losartan 72m/d for BP & vasc dis... ~  5/16: on Losartan50; BP= 144/76 & he says he's asymptomatic, continue same...  CEREBROVASCULAR DISEASE (ICD-437.9) - stable on ASA 3270md & PLAVIX 7551m per DrDeveshwar/ DrLawson/ DrReynolds... he has severe extracranial cerebrovasc disease- esp in the post circulation, and he is s/p vertebral art angioplasty x 2, and w/ a known basilar  art occlusion- prev on Coumadin controlled by Neurology (switched to ASA/ Plavix in 2010)... also followed by Sheryn Bison and DrDeveshwar for IR... he had a left CAE in 6/00 by [facility]... ~  CDoppler 2/07 showed 0-39% bilat ICA stenoses, s/p left CAE w/ DPA... ~  eval 3/10 by Sheryn Bison w/ CDopplers that showed similar patency w/ 0-39% bilat ICA stenoses; due to the 10 yr interval DrLawson did not feel he needed any additional follow up. ~  12/10: continued f/u DrDevershwar IR w/ MRI/ MRAs showing bilat carotid siphon region  atherosclerosis & narrowing (unchanged), and markedly diseased distal left vertebral & prox basilar art narrowing (no change)... ~  11/12: CTA head & neck showed similar severe dis w/ mod to severe bilat cavernous ICA stenoses due to extensive atherosclerosis; severe distal vertebral atherosclerosis- distal right occluded, distal left w/ high grade stenosis; mod to severe bilat PCA stenoses; chr cerebellar infarcts, no acute infarcts. ~  11/14: on ASA81 & Plavix75; known severe extracranial cerebrovasc dis w/ left CAE 2000 by DrLawson & known severe bilat cavernous ICA stenoses; he is also s/p vertebral art angioplasty x2 by DrTDeveshwar w/ known basilar art occlusion ~  8/15: on ASA81 & Plavix75; known severe extracranial cerebrovasc dis w/ left CAE 2000 by DrLawson & known severe bilat cavernous ICA stenoses; he is also s/p vertebral art angioplasty x2 by DrTDeveshwar w/ known basilar art occlusion, he endorses medication compliance & denies cerebral ischemic symptoms... ~  11/15: Hosp by [facility] when he presented w/ dysarthria; hx severe vasc dis w/ known severe extracranial cerebrovasc dis w/ left CAE 2000 by [facility] & known severe bilat cavernous ICA stenoses; he is also s/p vertebral art angioplasty x2 by DrTDeveshwar w/ known basilar art occlusion; he was taking meds on his own & not monitored by family- he had stopped his Plavix (confused, didn't remember), ?DM meds & Simva; Hosp eval included CT Head & MRI/MRA Brain showing old cerebellar & thalamic infarcts and small vessel dis, acute sm nonhemorrhaghic infarcts throughout the left frontal/ temporal/ & parietal areas; Left ICA is occluded (reconstitution of flow in supraclinoid region appears inadeq), mod stenosis of Right ICA cavernous segment; occluded right vertebral and right PICA; high grade stenosis of Left vertebral art & mod narrowing of prox basilar art- see full report!  Subseq CT Angio Head & Neck showed prev Left CAE markedly attenuated &  occluded at skullbase/ petrous segment of the left ICA, diminutive right vertebral that occludes at the skullbase, dominant left vertebral w/ hi-grade stenosis distally, diffuse atherosclerotic changes... 2DEcho was neg for embolic source... He was placed back on ASA/Plavix & no intervention recommended, reminded about need for strict regulation of BP, Chol, DM... Speech improved in [place] for outpt f/u w/ DrSethi (he did not return)... ~  12/15: Hosp by [facility] w/ confusion, ataxia, & speech difficulty w/ BS found to be 666 (this is when it was discovered that he was taking his own meds without supervision & several meds hadn't been filled in months); he had an Enterococcus UTI- treated w/ Augmentin; repeat MRI/MRA Brain w/o change from Nov; he was re-evaluated by [facility] for IR- cerebral angiography performed and attempted revasc of the left ICA was attempted but unsuccessful, he was disch home on same meds (ASA/Plavix) w/ supervision of all meds from wife; Lisinopril was held & they rec keeping BP in the 130-160 range...  ~  5/16: he has severe vascular dis & wife is concerned about memory loss & behavioral changes- continue  ASA/ Plavix & she requests Neuro 2nd opinion...  VENOUS INSUFFICIENCY (ICD-459.81) - he knows to avoid sodium, elevate legs, wear support hose...  HYPERCHOLESTEROLEMIA (ICD-272.0) - on CRESTOR 55m/d since Hosp 12/12... ~  [place] showed TChol 153, TG 120, HDL 36, LDL 93 ~  FLP 8/09 showed TChol 131, TG 84, HDL 38, LDL 76 ~  2010:  pt was not fasting for blood work... rec to continue Crestor10 + diet efforts. ~  FLP 10/11 showed TChol 210, TG 161, HDL 40, LDL 149... rec to start Crestor 247md, he will decide. ~  FLP 6/12 on Cres20 showed TChol 106, TG 95, HDL 41, LDL 46... Continue same. ~  FLBib[place]2/12 in [place]) showed TChol 100, TG 101, HDL 37, LDL 43... They decr to Cres10. ~  FLP 3/13 on Cres10 showed TChol 118, TG 67, HDL 44, LDL 61... Continue same,  later switched to Simva40 for $$ reasons. ~  FLP 10/14 on Simva40 showed TChol 136, TG 54, HDL 49, LDL 77... Continue same. ~  FLP 12/15 on Simva40 w/ ?compliance showed TChol 175, TG 105, HDL 41, LDL 113 => [place]/ supervision of all meds by wife. ~  FLP 5/16 on Atorva80 showed TChol 113, TG 51, HDL 48, LDL 54 => much improved, continue the same.  DIABETES MELLITUS (ICD-250.00) >>  ~  on METFORMIN 50078m2Bid, GLYBURIDE 5mg18m, ACTOS 30mg55m& ONGLYZA 5mg/d29m ~  labs 6/08 showed BS= 155, HgA1c= 8.1... (glMarland Kitchenburide incr to 5Bid). ~  labs 11/08 showed BS= 352, HgA1c= 8.1.... (Actos added). ~  labs 8/09 showed BS= 128, HgA1c= 7.1... recMarland KitchenMarland Kitchensame meds, better diet, get wt down! ~  labs 2/10 showed BS= 249, A1c= 7.3.... Metform incr to 2Bid. ~  labs 12/10 (nonfasting- on all 3 meds) showed BS= 87, A1c= 6.9 ~  labs 10/11 on Metform1000Bid+Gybur5Bid showed BS= 180, A1c= 8.4... recMarland KitchenMarland Kitcheno incr GLYBUR10Bid + diet. ~  labs 6/12 on Metform1000Bid+Glybur10Bid showed BS= 196, A1c= 11.2 ?what happened?> he declined insulin & Endocrine consult, opted for max oral meds by adding ACTOS 30mg/d38mNGLYZA 5mg/d..99m  Labs 12/12 in Hosp shoEdmondBS 150-200+ and A1c= 7.2; covered w/ SSI in hosp & placed back on 4 oral meds on disch from rehab... ~  Labs 1/13 on all 4 meds showed BS= 230, A1c=7.1; we reviewed diet & he will start PT soon... ~  Labs 3/13 on 4meds sh20md BS= 72, A1c= 6.6 ~  4/13: pt called w/ hypoglycemia & we decreased Glyburide 5mg from 34md to 1Bid... ~  6/13:  Pt on Metform500-2Bid, Glybur5mgBid, Ac14m30, Onglyza5; BS at home 70-90 he says & still drinking Boost; Labs showed BS=190  A1c=7.4  ; we decided to cut the Glyburide5mg Qam onl56mut keep the Metform500-2Bid +Actos +Onglyza... ~  10/13: on his 4meds> BS=1756mA1c=7.7 but he's gained 16#; told to STOP the boost, get on diet, get wt down, take all meds every day... ~  10/14: seen by TP> Labs 10/14 showed BS=252, A1c=9.1 & Lantus added to  his Metform500-2Bid, Diabeta5, Onglyza5; Lantus10u added... ~  11/14: on Lantus10u, Metform500-2Bid, Diabeta5, Onglyza5, off Actos; f/u labs 11/14 showed BS=182, A1c=8.3; continue same, get wt down. ~  8/15: on Lantus10u, Metform500-2Bid, Diabeta5, Onglyza5; Labs 8/15 showed BS=167, A1c=7.3... Continue mMarland KitchenMarland Kitchens and get wt down. ~  12/15: now on Lantus15u now, Metform500-2Bid, Diabeta5AM, Onglyza5PM; compliance w/ meds was poor when taking his own meds; Labs 12/15 showed BS=146-665, A1c=9.3 => now all supervised by  wife. ~  2/16: on Lantus15u now, Metform500-2Bid, Diabeta5AM, Onglyza5PM; Labs 2/16 showed BS=96, A1c=7.6, therefore continue same meds & diet... ~  5/16: on Lantus15u, Metform500-2Bid, Diabeta5AM, Onglyza5PM; compliance w/ meds was poor but wife supervises now; Labs 5/16 shows BS=130, A1c=6.8 => much improved, continue same.  OVERWEIGHT (ICD-278.02) - he states that he is not on much of a diet & we reviewed low carb/ no sweets/ low fat diet. ~  weight 8/09 = 216# ~  weight 2/10 = 216# ~  weight 12/10 = 212# ~  weight 10/11 = 210# ~  Weight 6/12 = 206# and he admits to not being on much of a diet... ~  Weight 2/13 = non weight bearing still... ~  Weight 3/13 = 193# ~  Weight 6/13 = 191# ~  Weight 10/13 = 206# ~  Weight 11/14 = 198# ~  Weight 2/15 = 193# ~  Weight 1/16 = 181# ~  Weight 5/16 = 189#  GERD (ICD-530.81) - prev on Nexium40, he switched to OTC Prilosec, now [place] 65m/d due to Plavix Rx; pt had stopped this on his own & asked to restart 10/13. ~  EGD 19/03 showed [place] GERD & esoph stricture dilated... ~  EGD 12/09 by DrPatterson showed HH & severe erosive esophagitis from GERD... ~  10/13: presents w/ incr reflux- w/ dysphagia & food sticking in mid-esoph that occurs every 2-3days & has to vomit to feel better; he had stopped his PPI on his own (?why?), rec to restart the Protonix40 & we will refer to GI for EGD & dilatation... ~  10/13: he saw DrPatterson> EGD 11/13  showed HH, chr GERD, stricture dil w/ 28F [place] dilator, erosive gastritis was HPylori neg & treated w/ daily PPI rx...  ~  He remains on Protonix40/d & doing satis w/o reflux symptoms...  DIVERTICULOSIS OF COLON (ICD-562.10) - note: [place] loose stools on the Metformin 10021mid... ~  colonoscopy 10/03 by DrPatterson showed divertics only... ~  colonoscopy 12/09 by DrPatterson was also normal x for divertics... ~  He denies n/v, c/d, blood seen...  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 Hx of RENAL CELL CANCER (ICD-189.0) - he had a partial nephrectomy of the right side of his horseshoe kidney in 1994 due to renal cell ca... still followed [place]early [place] his hx but we don't have any recent notes... ~  labs 10/11 showed PSA= 0.95 ~  Labs 3/13 showed BUN=21, Creat=1.1, PSA=2.75 ~  Labs 10/13 showed BUN= 25, Creat= 1.2 ~  Labs 10/14 showed BUN=38, Cr=1.5 (w/ enterococcus UTI);  Labs 11/14 showed BUN=29, Cr=1.2 ~  Labs 8/15 showed BUN= 24, Cr= 1.2 ~  Labs 12/15 showed Cr= 1.1-1.4 ~  Labs 2/16 showed BUN= 18, Cr= 1.12  Hx of NEPHROLITHIASIS (ICD-592.0) - remote hx of kidney stones followed by DrPeterson...  DEGENERATIVE JOINT DISEASE (ICD-715.90) &  GOUT (ICD-274.9) - on ALLOPURINOL 30067m... ~  labs 10/11 showed URIC= 4.7 ~  [facility] after fall from ladder cleaning gutters w/ complex right tibial plat fx- ORIF, 2 operations by DrHardy, & hosp course complic by ileus, TIA, electrolyte abn, etc;  Event disch [facility] Rehab & [facility]  BACK PAIN, LUMBAR (ICD-724.2) - known degenerative spondylitic disease at L4-5 and L5-S1 prev [facility] (now DrElsner & DrHarkins)... ~  6/12: he reports further back pain w/ eval by Chiropractor showing L4 disc prob & regular adjustments; but now notes  incr pain & he requests referral to Neurosurg for further eval ==> seen by DrElsner, then DrHarkins for shots which has helped... ~  2014: he has continued f/u w/ DrHarkins[facility] & received periodic ESI injections for his Lumbar DDD; they added NORTRIPTYLINE 47mQhs & slowly incr the dose  NEURO >> Hx Left Hemispheric TIA in 11/12 & MEMORY IMPAIRMENT ~  3/14:  He had Neuro eval DrSethi> he did Labs, EEG, MRI Brain, & MRA Brain & Neck; Rec to take 2 FishOil caps daily, CerefolinNAC daily, work puzzles etc, continue Plavix & strict control of BP, Chol, DM; they did not start Aricept... ~  11/15 & 12/15> see above> Hosp w/ wastershed strokes left frontal/ temporal/ parietal areas when he wasn't taking his Plavix; now all meds supervised by wife; he had MRIs, CT Angio, and Arteriograms w/ attempted intervention on left by DrTDeveshwar- unsuccessful & rec for continued ASA/ Plavix, & control on BP, Chol, DM w/ BP in the 130-160 range... 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  ANXIETY (ICD-300.00) - uses Zolloft50 & ALPRAZOLAM 0.567mas needed.  B12 DEFICIENCY >>  ~  5/16:  Labs showed B12 = 196 & rec to start 100092mby mouth daily & we will f/i labs...   Past Surgical History  Procedure Laterality Date  . Incisional hernia repair  1/96    Dr.Leone  . Nasal sinus surgery  12/96    Dr.Redman  . Cholecystectomy  9/04    Dr. LeoRebekah [place] Nephrectomy  1994    renal cell cancer in horseshoe kidney; right  . Carotid endarterectomy  09/2000    Dr.Lawson  . Veterbral art angioplasty      x2  . Small intestine surgery    . External fixation leg  03/10/2011    Procedure: EXTERNAL FIXATION LEG;  Surgeon: MicRozanna BoxLocation: [facility];  Laterality: Right;  . Orif tibia plateau  03/24/2011    Procedure: OPEN REDUCTION INTERNAL FIXATION (ORIF) TIBIAL PLATEAU;  Surgeon:  MicRozanna BoxLocation[facility] [place];  Laterality: Right;  . Radiology with anesthesia N/A 04/12/2014    Procedure: ANGIOPLASTY;  Surgeon: SanRob HickmanD;  [facility];  Laterality: N/A;    Outpatient Encounter Prescriptions as of 03/11/2015  Medication Sig  . ALPRAZolam (XANAX) 0.5 MG tablet TAKE 1/2-1 TABLET BY MOUTH 3 TIMES DAILY AS NEEDED FOR NERVES  . aspirin EC 81 MG EC tablet Take 1 tablet (81 mg total) by mouth daily.  . clopidogrel (PLAVIX) 75 MG tablet Take 1 tablet (75 mg total) by mouth daily.  . dMarland Kitchennepezil (ARICEPT) 10 MG tablet Take 1/2 tablet daily for 1 week, then increase to 1 tablet daily  . glyBURIDE (DIABETA) 5 MG tablet TAKE ONE TABLET BY MOUTH EVERY MORNING WITH BREAKFAST  . LANTUS SOLOSTAR 100 UNIT/ML Solostar Pen INJECT 10 UNITS INTO THE SKIN AT BEDTIME.  . lMarland Kitchensartan (COZAAR) 50 MG tablet TAKE 1 TABLET (50 MG TOTAL) BY MOUTH DAILY.  . pantoprazole (PROTONIX) 40 MG tablet Take 1 tablet (40 mg total) by mouth daily.  . saxagliptin HCl (ONGLYZA) 5 MG TABS tablet Take 1 tablet (5 mg total) by mouth daily.  . sertraline (ZOLOFT) 50 MG tablet  TAKE 1 TABLET (50 MG TOTAL) BY MOUTH AT BEDTIME.  . traMADol (ULTRAM) 50 MG tablet Take 1 tablet (50 mg total) by mouth 3 (three) times daily as needed.  . [DISCONTINUED] atorvastatin (LIPITOR) 80 MG tablet TAKE 1 TABLET BY MOUTH EVERY DAY AT 6PM  . [DISCONTINUED] metFORMIN (GLUCOPHAGE) 500 MG tablet Take 2 tablets by mouth two times daily  . cephALEXin (KEFLEX) 500 MG capsule Take 1 capsule (500 mg total) by mouth 3 (three) times daily.  . [DISCONTINUED] LANTUS SOLOSTAR 100 UNIT/ML Solostar Pen INJECT 10 UNITS INTO THE SKIN AT BEDTIME. (Patient taking differently: 15 units daily)   No facility-administered encounter medications on file as of 03/11/2015.    Allergies  Allergen Reactions  . Dilaudid [Hydromorphone Hcl] Other (See Comments)    hallucinations  . Fentanyl Other (See  Comments)    hallucinations    Current Medications, Allergies, Past Medical History, Past Surgical History, Family History, and Social History were reviewed in [employer] record.    Review of Systems        See HPI - all other systems neg except as noted...  The patient complains of cyst on back & dyspnea on exertion.  The patient denies anorexia, fever, weight loss, weight gain, vision loss, decreased hearing, hoarseness, chest pain, syncope, peripheral edema, prolonged cough, headaches, hemoptysis, abdominal pain, melena, hematochezia, severe indigestion/heartburn, hematuria, incontinence, muscle weakness, suspicious skin lesions, transient blindness, difficulty walking, depression, unusual weight change, abnormal bleeding, enlarged lymph nodes, and angioedema.     Objective:   Physical Exam     WD, WN, 74 y/o WM in NAD...  GENERAL:  Alert & oriented; pleasant & cooperative... HEENT:  [place]/AT, EOM-wnl, PERRLA, EACs-clear, TMs-wnl, NOSE-clear, THROAT-clear & wnl. NECK:  Supple w/ fairROM; no JVD; s/p left CAE, +bruits; no thyromegaly or nodules palpated; no lymphadenopathy. CHEST:  Clear to P & A; without wheezes/ rales/ or rhonchi heard... HEART:  Regular Rhythm; gr 1/6 SEM without rubs or gallops detected... ABDOMEN:  Soft & nontender; normal bowel sounds; no organomegaly or masses palpated... EXT: right knee deformity s/p ORIF, mod arthritic changes; no varicose veins/ +venous insuffic/ 1+ edema. NEURO:  CN's intact; no focal neuro deficits... DERM:  Quarter sized sebaceous cyst in mid back, sl red, min tender...  RADIOLOGY DATA:  Reviewed in the EPIC EMR & discussed w/ the patient...  LABORATORY DATA:  Reviewed in the EPIC EMR & discussed w/ the patient...   Assessment & Plan:    Sebaceous cyst>> sebum expressed, clean w/ mild soapy water, cover w/ neosporin/ dry gauze/ paper tape, Rx w/ Keflex x 7d & call if not resolved for referral to surgeon for  drainage...   BP>  Neuro has wanted his BP to be sl higher than usual for perfusion reasons but BP= 170/80 on arrival & improved to 150/80 w/ rest; we opted to start Losartan 55m/d for BP & vasc dis... 5/16> BP= 144/76 today, continue Losar50... 9/16> BP= 120/60 & he remains asymptomatic...  CEREBROVASC Disease & STROKES 11/15 & TIA 12/12>  Known severe cerebrovasc dis & he had TIA while in hosp for fx leg 2012 (he was off plavix at the time); Recurrent prob 11/15 w/ watershed infarcts in left frontal/ temporal/ parietal regions while he was off Plavix (he was taking meds on his own & hadn't been filling the Plavix); s/p extensive eval w/ MRI/MRA, CT Angio & Arteriogram by DLadene Artistw/ attempt at intervention on left unsuccessful; wife now supervises all meds... He  has prob multi-infarct dementia & wife requests Neuro 2nd opinion - we will refer...  NEURO> Mild Cognitive Impairment per DrSethi & wife is concerned re: memory/ vascular dementia; we will arrange for Neuro 2nd opinion...  CHOL>  on Atrova80 now & f/u FLP much improved...  DM>  On Lantus15, Metform, Diabeta, Onglyza & compliance is better w/ wife supervision; Labs 5/16 showed BS=130, A1c=6.8, continue same...  Overweight>  We discussed low carb, low fat wt reducing diet...  GI>  GERD prev controlled w/ Protonix but he stopped on his own (?why?); EGD 11/13 w/ dilatation & reminded to take the Protonix40 every day...  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  DJD/ Hx Gout/ LBP>  He was seeing chiropractor regularly regarding LBP & then f/u w/ DrElsner,Neurosurg & had ESI by DrHarkins & the shots have helped...  s/p fall w/ right Tibial plateaux fx> 1st ext fixation, 2nd ORIF>  By DrHardy, Ortho; exercise is difficult but very much needed; he's had extensive PT training...  Anxiety>  He remains on Alpraz Prn... Also has [place] on his list of meds...  B12  deficiency>  Labs 5/16 w/ B12 level = 196; rec to start oral B12 supplement ~108mg/d & repeat B12 level 11/16= 426   Patient's Medications  New Prescriptions   CEPHALEXIN (KEFLEX) 500 MG CAPSULE    Take 1 capsule (500 mg total) by mouth 3 (three) times daily.  Previous Medications   ALPRAZOLAM (XANAX) 0.5 MG TABLET    TAKE 1/2-1 TABLET BY MOUTH 3 TIMES DAILY AS NEEDED FOR NERVES   ASPIRIN EC 81 MG EC TABLET    Take 1 tablet (81 mg total) by mouth daily.   CLOPIDOGREL (PLAVIX) 75 MG TABLET    Take 1 tablet (75 mg total) by mouth daily.   DONEPEZIL (ARICEPT) 10 MG TABLET    Take 1/2 tablet daily for 1 week, then increase to 1 tablet daily   GLYBURIDE (DIABETA) 5 MG TABLET    TAKE ONE TABLET BY MOUTH EVERY MORNING WITH BREAKFAST   LOSARTAN (COZAAR) 50 MG TABLET    TAKE 1 TABLET (50 MG TOTAL) BY MOUTH DAILY.   PANTOPRAZOLE (PROTONIX) 40 MG TABLET    Take 1 tablet (40 mg total) by mouth daily.   SAXAGLIPTIN HCL (ONGLYZA) 5 MG TABS TABLET    Take 1 tablet (5 mg total) by mouth daily.   SERTRALINE (ZOLOFT) 50 MG TABLET    TAKE 1 TABLET (50 MG TOTAL) BY MOUTH AT BEDTIME.   TRAMADOL (ULTRAM) 50 MG TABLET    Take 1 tablet (50 mg total) by mouth 3 (three) times daily as needed.  Modified Medications   Modified Medication Previous Medication   ATORVASTATIN (LIPITOR) 80 MG TABLET atorvastatin (LIPITOR) 80 MG tablet      TAKE 1 TABLET BY MOUTH EVERY DAY AT 6PM    TAKE 1 TABLET BY MOUTH EVERY DAY AT 6PM   LANTUS SOLOSTAR 100 UNIT/ML SOLOSTAR PEN LANTUS SOLOSTAR 100 UNIT/ML Solostar Pen      INJECT 10 UNITS INTO THE SKIN AT BEDTIME.    INJECT 10 UNITS INTO THE SKIN AT BEDTIME.   METFORMIN (GLUCOPHAGE) 500 MG TABLET metFORMIN (GLUCOPHAGE) 500 MG tablet      TAKE 2 TABLETS TWICE A DAY    Take 2 tablets by mouth two times daily  Discontinued Medications   No medications on file

## 2015-04-15 ENCOUNTER — Other Ambulatory Visit: Payer: Self-pay | Admitting: Pulmonary Disease

## 2015-04-17 ENCOUNTER — Other Ambulatory Visit (HOSPITAL_COMMUNITY): Payer: Self-pay | Admitting: Interventional Radiology

## 2015-04-17 DIAGNOSIS — I771 Stricture of artery: Secondary | ICD-10-CM

## 2015-04-17 DIAGNOSIS — I639 Cerebral infarction, unspecified: Secondary | ICD-10-CM

## 2015-04-17 NOTE — Telephone Encounter (Signed)
Received refill request for Xanax. Please advise if ok to refill. Thank you.

## 2015-04-25 ENCOUNTER — Other Ambulatory Visit: Payer: Self-pay | Admitting: *Deleted

## 2015-04-25 MED ORDER — ALPRAZOLAM 0.5 MG PO TABS: ORAL_TABLET | ORAL | Status: DC

## 2015-05-02 ENCOUNTER — Ambulatory Visit (HOSPITAL_COMMUNITY)
Admission: RE | Admit: 2015-05-02 | Discharge: 2015-05-02 | Disposition: A | Discharge status: Home / Self Care | Payer: Medicare Other | Source: Ambulatory Visit | Attending: Interventional Radiology | Admitting: Interventional Radiology

## 2015-05-02 ENCOUNTER — Telehealth: Payer: Self-pay | Admitting: Pulmonary Disease

## 2015-05-02 ENCOUNTER — Ambulatory Visit (HOSPITAL_COMMUNITY): Payer: Medicare Other

## 2015-05-02 DIAGNOSIS — I6501 Occlusion and stenosis of right vertebral artery: Secondary | ICD-10-CM | POA: Insufficient documentation

## 2015-05-02 DIAGNOSIS — I771 Stricture of artery: Secondary | ICD-10-CM | POA: Insufficient documentation

## 2015-05-02 DIAGNOSIS — I6529 Occlusion and stenosis of unspecified carotid artery: Secondary | ICD-10-CM | POA: Insufficient documentation

## 2015-05-02 DIAGNOSIS — I6523 Occlusion and stenosis of bilateral carotid arteries: Secondary | ICD-10-CM | POA: Diagnosis not present

## 2015-05-02 DIAGNOSIS — I6502 Occlusion and stenosis of left vertebral artery: Secondary | ICD-10-CM | POA: Insufficient documentation

## 2015-05-02 DIAGNOSIS — I639 Cerebral infarction, unspecified: Secondary | ICD-10-CM | POA: Diagnosis not present

## 2015-05-02 DIAGNOSIS — I63033 Cerebral infarction due to thrombosis of bilateral carotid arteries: Secondary | ICD-10-CM | POA: Diagnosis not present

## 2015-05-02 LAB — CREATININE, SERUM
Creatinine, Ser: 1.17 mg/dL (ref 0.61–1.24)
GFR calc Af Amer: >60 mL/min (ref >60)
GFR calc non Af Amer: 59 mL/min — ABNORMAL LOW (ref >60)

## 2015-05-02 MED ORDER — INSULIN GLARGINE 100 UNIT/ML SOLOSTAR PEN: PEN_INJECTOR | SUBCUTANEOUS | Status: DC

## 2015-05-02 MED ORDER — GADOBENATE DIMEGLUMINE 529 MG/ML IV SOLN
20.0000 mL | Freq: Once | INTRAVENOUS | Status: AC
  Administered 2015-05-02: 17 mL via INTRAVENOUS

## 2015-05-02 NOTE — Telephone Encounter (Signed)
Spoke with pt's wife. Pt needs refills on his Lantus Solostar Pens. She would like these to be sent in. This has been done. Nothing further was needed.

## 2015-05-14 ENCOUNTER — Telehealth (HOSPITAL_COMMUNITY): Payer: Self-pay

## 2015-05-14 ENCOUNTER — Telehealth: Payer: Self-pay | Admitting: Pulmonary Disease

## 2015-05-14 NOTE — Telephone Encounter (Signed)
lmtcb X1 for pt's wife.  

## 2015-05-14 NOTE — Telephone Encounter (Signed)
Spoke with pt's wife and informed her that Dr. Estanislado Pandy would like the pt to f/u in 6 months with a MRI. She agreed with this plan. AW

## 2015-05-15 NOTE — Telephone Encounter (Signed)
LMTCB

## 2015-05-16 NOTE — Telephone Encounter (Signed)
LM for patient wife. 

## 2015-05-17 ENCOUNTER — Telehealth: Payer: Self-pay | Admitting: Pulmonary Disease

## 2015-05-17 NOTE — Telephone Encounter (Signed)
Called and line rings busy x 3. WCB

## 2015-05-20 MED ORDER — INJECTION DEVICE FOR INSULIN DEVI: Status: DC

## 2015-05-20 NOTE — Telephone Encounter (Signed)
atc pt X2, line rings busy.  Wcb.

## 2015-05-20 NOTE — Telephone Encounter (Signed)
Rx has been sent in. Pt's wife is aware. Nothing further was needed. 

## 2015-05-22 ENCOUNTER — Telehealth: Payer: Self-pay | Admitting: Pulmonary Disease

## 2015-05-22 NOTE — Telephone Encounter (Signed)
Called and spoke with Charlett Nose. She states the patient needs his lancets refilled. I explained to her that they were refilled on 05/20/15 by Mendel Ryder. [facility] [address]. She voiced understanding and had no further questions. Nothing further needed.

## 2015-05-27 ENCOUNTER — Other Ambulatory Visit: Payer: Self-pay | Admitting: Pulmonary Disease

## 2015-05-29 ENCOUNTER — Telehealth: Payer: Self-pay | Admitting: Pulmonary Disease

## 2015-05-29 MED ORDER — GLUCOSE BLOOD VI STRP: ORAL_STRIP | Status: DC

## 2015-05-29 NOTE — Telephone Encounter (Signed)
Spoke with patient's wife(DPR on file); she states she called last week needing patients test strips [facility] [place] [place] [facility] [address]. States they were never sent in. [facility] [place] [place] and was informed that the [facility] for test strips [facility] [address] in [place], [place] and we as the MD office would have to contact that pharmacy to get Rx transferred [facility] [place], [place].   Contacted Allison at [place] location-states that they do not have any rx's on file for patient's test strips; was told to just send new Rx [facility] test strips to [facility] [place], [place].   Pt's wife is aware that Rx has been taken care of and sent to the correct pharmacy. Nothing more needed at this time.

## 2015-05-30 ENCOUNTER — Telehealth: Payer: Self-pay | Admitting: *Deleted

## 2015-05-30 NOTE — Telephone Encounter (Signed)
Initiated PA for Glyburide 5mg  thru [facility]. Key: [id number] Will await response.  [facility] (p) [phone]  (f)  [phone]

## 2015-05-30 NOTE — Telephone Encounter (Signed)
Glyburide was denied stating it is not a covered medication due to being not on the drug list. States pt must try Glipizide regular/extended release.

## 2015-06-05 NOTE — Telephone Encounter (Signed)
SN is out of office this week. Will address when he returns.

## 2015-06-07 ENCOUNTER — Telehealth: Payer: Self-pay | Admitting: Pulmonary Disease

## 2015-06-07 MED ORDER — GLUCOSE BLOOD VI STRP: ORAL_STRIP | Status: DC

## 2015-06-07 MED ORDER — GLIPIZIDE ER 5 MG PO TB24: 5.0000 mg | ORAL_TABLET | Freq: Every day | ORAL | Status: DC

## 2015-06-07 NOTE — Telephone Encounter (Signed)
Spoke with pt spouse. She is aware of below. The pharmacy called back and needs exact name of testing strips to provide. Pt has one touch ultra 2 monitor. Otilio Jefferson the pharmacists the name of testing device. Nothing further needed

## 2015-06-07 NOTE — Telephone Encounter (Signed)
[facility]ed [facility] [place]. Was advised they never received RX for test strips from 05/29/15. Per the pharmacists since pt has medicare, unable to take VO over the phone. Will need to send in RX. I have electronically sent this in.  Called spoke with spouse--LMTCB x1  Also see previous message about PA denial

## 2015-06-07 NOTE — Telephone Encounter (Signed)
Per SN: we can changed to Glipizide ER 5 mg 1 po qam #30 or #90 Be sure he knows change is required by his insurance. ---  ATC pt, NA, VM not set up yet. WCB

## 2015-06-07 NOTE — Telephone Encounter (Signed)
Called spoke with pt spouse. She is aware of below. Requests 90 day supply. I have sent this in. Nothing further needed

## 2015-06-16 ENCOUNTER — Other Ambulatory Visit: Payer: Self-pay | Admitting: Pulmonary Disease

## 2015-06-20 ENCOUNTER — Other Ambulatory Visit: Payer: Self-pay | Admitting: Pulmonary Disease

## 2015-06-20 NOTE — Telephone Encounter (Signed)
[facility] Pt last OV 11/16 and scheduled for OV 5/17. Last fill 04/25/15 for #90.

## 2015-06-28 MED ORDER — ALPRAZOLAM 0.5 MG PO TABS: 0.2500 mg | ORAL_TABLET | Freq: Three times a day (TID) | ORAL | Status: DC | PRN

## 2015-06-28 NOTE — Telephone Encounter (Signed)
Ok to refill per JJ Refilled #[facility] x zero [facility].  Nothing further needed.

## 2015-06-30 ENCOUNTER — Other Ambulatory Visit: Payer: Self-pay | Admitting: Pulmonary Disease

## 2015-07-15 ENCOUNTER — Other Ambulatory Visit: Payer: Self-pay | Admitting: Pulmonary Disease

## 2015-07-15 ENCOUNTER — Other Ambulatory Visit: Payer: Self-pay | Admitting: Neurology

## 2015-07-22 ENCOUNTER — Other Ambulatory Visit: Payer: Self-pay | Admitting: Neurology

## 2015-07-26 ENCOUNTER — Other Ambulatory Visit: Payer: Self-pay | Admitting: Neurology

## 2015-07-29 ENCOUNTER — Telehealth: Payer: Self-pay | Admitting: Neurology

## 2015-07-29 DIAGNOSIS — G3184 Mild cognitive impairment, so stated: Secondary | ICD-10-CM

## 2015-07-29 MED ORDER — DONEPEZIL HCL 10 MG PO TABS: 10.0000 mg | ORAL_TABLET | Freq: Every day | ORAL | Status: DC

## 2015-07-29 NOTE — Telephone Encounter (Signed)
Pt is out of the aricept and would like a refill he has appt to see dr Delice Lesch coming up please call patient at [phone] would liek it [facility] [place]

## 2015-08-02 ENCOUNTER — Encounter: Payer: Self-pay | Admitting: Neurology

## 2015-08-02 ENCOUNTER — Ambulatory Visit (INDEPENDENT_AMBULATORY_CARE_PROVIDER_SITE_OTHER): Payer: Medicare Other | Admitting: Neurology

## 2015-08-02 VITALS — BP 128/70 | HR 61 | Ht 66.0 in | Wt 175.0 lb

## 2015-08-02 DIAGNOSIS — E785 Hyperlipidemia, unspecified: Secondary | ICD-10-CM | POA: Insufficient documentation

## 2015-08-02 DIAGNOSIS — I10 Essential (primary) hypertension: Secondary | ICD-10-CM

## 2015-08-02 DIAGNOSIS — Z8673 Personal history of transient ischemic attack (TIA), and cerebral infarction without residual deficits: Secondary | ICD-10-CM | POA: Insufficient documentation

## 2015-08-02 DIAGNOSIS — G3184 Mild cognitive impairment, so stated: Secondary | ICD-10-CM

## 2015-08-02 DIAGNOSIS — E119 Type 2 diabetes mellitus without complications: Secondary | ICD-10-CM

## 2015-08-02 DIAGNOSIS — Z794 Long term (current) use of insulin: Secondary | ICD-10-CM

## 2015-08-02 MED ORDER — DONEPEZIL HCL 10 MG PO TABS: 10.0000 mg | ORAL_TABLET | Freq: Every day | ORAL | Status: DC

## 2015-08-02 NOTE — Progress Notes (Signed)
NEUROLOGY FOLLOW UP OFFICE NOTE  PASCO DENARO AU:[id number]  HISTORY OF PRESENT ILLNESS: I had the pleasure of seeing Samuelle Fulford in follow-up in the neurology clinic on 08/02/2015.  He is again accompanied by his wife who helps supplement the history today. The patient was last seen a year ago for worsening memory. MOCA score in June 2016 was 20/30, indicating mild cognitive impairment. He was started on Aricept 10mg  daily, which he is tolerating without side effects. His wife reports memory may be a little worse but feels that the Aricept is helping. She states it is worse because she would ask him to find something and he brings back a different thing. He has occasional word-finding difficulties. His wife puts his medications in a pillbox and occasionally reminds him to take the medication. She is in charge of bill payments. He denies getting lost driving, but only drives minimally, his wife has no driving concerns. No difficulties with ADLs. He has occasional dizziness on standing, otherwise denies any headaches, vision changes, focal numbness/tingling/weakness, bowel/bladder dysfunction, hallucinations, or falls.   HPI: This is a pleasant 75 yo RH man with vascular risk factors including hypertension, diabetes, hyperlipidemia, peripheral vascular disease, TIA in 2012, and left MCA watershed infarcts secondary to left ICA stenosis, presenting for evaluation of worsening memory. He had previously seen neurologist Dr. Leonie Man in 2014 with his wife reporting mild memory difficulties since around 2012. He had mostly short-term memory difficulties, needing to be told several times and not remembering. His MMSE at that time was 27/30. His wife reports that after his strokes in November and December 2015, memory has been much worse. Hospital records were reviewed, in November he presented with aphasia, MRI showed left frontal and parietal watershed infarcts. His carotid angiogram showed severe preocclusive  stenosis of the left proximal ICA, 80-85% stenosis of the right ICA proximal cavernous segment, 80% stenosis of the dominant left vertebral basilar junction just proximal to the basilar artery. He was discharged on aspirin and Plavix. He returned to the ER a month later with confusion, anomia, and ataxia. MRI showed multiple ischemic infarcts in the left frontal and parietal lobes, as well as the posterior left temporal lobe at the temporoparietal junction, cerebral angiogram showed interval progression of stenosis of left ICA distal to origin of ophthalmic artery with severe near complete occlusion of proximal cavernous left ICA, 70% stenosis of proximal cavernous right ICA, and 60% stenosis of dominant left vertebral artery at origin. There was unsuccessful attempt at revascularization of occluded left ICA cavernous segment, and note of multiple collaterals in the left ICA cavernous occluded segment. He was discharged home with SBP goal between 130 to 160 and control of vascular risk factors.   He feels his memory is "average, there are times I cannot remember." His wife reports he is not remembering things he used to know to do, such as how to fix things around the house and which are the right tools to use. He has had word-finding difficulties since the stroke, his wife notices this once in a while. He forgets to take his medications, his wife now sets this out for him. He denies getting lost driving, his wife is concerned "somewhat," he occasionally cannot recall the way to go somewhere. He occasionally gets mad and frustrated, but no paranoia. He denies any alcohol or tobacco use, no significant head injuries. His mother had memory changes in her 76s.   PAST MEDICAL HISTORY: Past Medical History  Diagnosis Date  .  Obstructive sleep apnea (adult) (pediatric)     doesnt wear CPAP  . HTN (hypertension)   . Cerebrovascular disease, unspecified   . Unspecified venous (peripheral) insufficiency   . Pure  hypercholesterolemia   . Type II or unspecified type diabetes mellitus without mention of complication, not stated as uncontrolled   . Overweight(278.02)   . Diaphragmatic hernia without mention of obstruction or gangrene   . Esophageal reflux   . Diverticulosis of colon (without mention of hemorrhage)   . Other specified congenital anomaly of kidney   . Kidney carcinoma ([place])   . Calculus of kidney   . Gout, unspecified   . Lumbago   . Anxiety state, unspecified   . History of gallstones   . IBS (irritable bowel syndrome)   . History of seizures   . Bowel obstruction ([place])   . Osteoarthrosis, unspecified whether generalized or localized, unspecified site     tendonitis  . Blood transfusion without reported diagnosis   . Stroke ([place])     2001  . Seizures ([place])     last seizure November 2012    MEDICATIONS: Current Outpatient Prescriptions on File Prior to Visit  Medication Sig Dispense Refill  . ALPRAZolam (XANAX) 0.5 MG tablet Take 0.5-1 tablets (0.25-0.5 mg total) by mouth 3 (three) times daily as needed. 90 tablet 0  . aspirin EC 81 MG EC tablet Take 1 tablet (81 mg total) by mouth daily. 30 tablet 0  . atorvastatin (LIPITOR) 80 MG tablet TAKE 1 TABLET BY MOUTH EVERY DAY AT 6PM 30 tablet 2  . clopidogrel (PLAVIX) 75 MG tablet TAKE 1 TABLET BY MOUTH EVERY DAY 90 tablet 0  . donepezil (ARICEPT) 10 MG tablet Take 1 tablet (10 mg total) by mouth daily. 30 tablet 0  . glipiZIDE (GLUCOTROL XL) 5 MG 24 hr tablet Take 1 tablet (5 mg total) by mouth daily with breakfast. 90 tablet 3  . Insulin Glargine (LANTUS SOLOSTAR) 100 UNIT/ML Solostar Pen INJECT 10 UNITS INTO THE SKIN AT BEDTIME. (Patient taking differently: 15 Units. INJECT 10 UNITS INTO THE SKIN AT BEDTIME.) 15 pen 5  . losartan (COZAAR) 50 MG tablet TAKE 1 TABLET (50 MG TOTAL) BY MOUTH DAILY. 90 tablet 1  . metFORMIN (GLUCOPHAGE) 500 MG tablet TAKE 2 TABLETS TWICE A DAY 360 tablet 2  . pantoprazole (PROTONIX) 40 MG tablet Take  1 tablet (40 mg total) by mouth daily. 90 tablet 3  . saxagliptin HCl (ONGLYZA) 5 MG TABS tablet Take 1 tablet (5 mg total) by mouth daily. 90 tablet 3  . sertraline (ZOLOFT) 50 MG tablet TAKE 1 TABLET (50 MG TOTAL) BY MOUTH AT BEDTIME. 90 tablet 2   No current facility-administered medications on file prior to visit.    ALLERGIES: Allergies  Allergen Reactions  . Dilaudid [Hydromorphone Hcl] Other (See Comments)    hallucinations  . Fentanyl Other (See Comments)    hallucinations    FAMILY HISTORY: Family History  Problem Relation Age of Onset  . Heart attack Father   . Dementia Mother   . Ovarian cancer Paternal Grandmother   . Breast cancer Sister   . Colon cancer Neg Hx   . Esophageal cancer Neg Hx   . Rectal cancer Neg Hx   . Stomach cancer Neg Hx     SOCIAL HISTORY: Social History   Social History  . Marital Status: Married    Spouse Name: Charlett Nose  . Number of Children: 2  . Years of Education: N/A  Occupational History  . barber     still working   Social History Main Topics  . Smoking status: Never Smoker   . Smokeless tobacco: Never Used  . Alcohol Use: No  . Drug Use: No  . Sexual Activity: No   Other Topics Concern  . Not on file   Social History Narrative   Patient drinks 4 cups of a caffinated Drinks week.    REVIEW OF SYSTEMS: Constitutional: No fevers, chills, or sweats, no generalized fatigue, change in appetite Eyes: No visual changes, double vision, eye pain Ear, nose and throat: No hearing loss, ear pain, nasal congestion, sore throat Cardiovascular: No chest pain, palpitations Respiratory:  No shortness of breath at rest or with exertion, wheezes GastrointestinaI: No nausea, vomiting, diarrhea, abdominal pain, fecal incontinence Genitourinary:  No dysuria, urinary retention or frequency Musculoskeletal:  No neck pain, back pain Integumentary: No rash, pruritus, skin lesions Neurological: as above Psychiatric: No depression,  insomnia, anxiety Endocrine: No palpitations, fatigue, diaphoresis, mood swings, change in appetite, change in weight, increased thirst Hematologic/Lymphatic:  No anemia, purpura, petechiae. Allergic/Immunologic: no itchy/runny eyes, nasal congestion, recent allergic reactions, rashes  PHYSICAL EXAM: Filed Vitals:   08/02/15 0950  BP: 128/70  Pulse: 61   General: No acute distress Head:  Normocephalic/atraumatic Neck: supple, no paraspinal tenderness, full range of motion Heart:  Regular rate and rhythm Lungs:  Clear to auscultation bilaterally Back: No paraspinal tenderness Skin/Extremities: No rash, no edema Neurological Exam: alert and oriented to person, place, and time. No aphasia or dysarthria. Fund of knowledge is appropriate.  Recent and remote memory are intact.  Attention and concentration are normal.    Able to name objects and repeat phrases. CDT 4/5 MMSE - Mini Mental State Exam 08/02/2015  Orientation to time 3  Orientation to Place 5  Registration 3  Attention/ Calculation 4  Recall 0  Language- name 2 objects 2  Language- repeat 1  Language- follow 3 step command 3  Language- read & follow direction 1  Write a sentence 1  Copy design 1  Total score 24   Cranial Nerves: Pupils equal, round, reactive to light. Extraocular movements intact with no nystagmus. Visual fields full. Facial sensation intact. No facial asymmetry. Tongue, uvula, palate midline.  Motor: Bulk and tone normal, muscle strength 5/5 throughout with no pronator drift.  Sensation to light touch intact.  No extinction to double simultaneous stimulation.  Deep tendon reflexes +1 throughout, toes downgoing.  Finger to nose testing intact.  Gait narrow-based and steady, able to tandem walk adequately.  Romberg negative.  IMPRESSION: This is a 75 yo RH man with vascular risk factors including hypertension, diabetes, hyperlipidemia, peripheral vascular disease with 2 strokes in Nov/December 2015 due to left  ICA occlusion, intracranial atherosclerosis, previous TIA in 2012, with mild cognitive impairment. MMSE today 24/30, again indicating MCI. Continue Aricept 10mg  daily. We again discussed expectations from the medication. We also discussed the importance of control of vascular risk factors and secondary stroke prevention, continue dual therapy with aspirin and Plavix. They know to go to ER immediately for any change in symptoms. The importance of physical exercise and brain stimulation exercises for brain health were again discussed. He will follow-up in 1 year and knows to call for any changes.   Thank you for allowing me to participate in his care.  Please do not hesitate to call for any questions or concerns.  The duration of this appointment visit was 25 minutes of face-to-face time  with the patient.  Greater than 50% of this time was spent in counseling, explanation of diagnosis, planning of further management, and coordination of care.   Ellouise Newer, [name]   CC: Dr. Lenna Gilford

## 2015-08-02 NOTE — Patient Instructions (Signed)
1. Continue Aricept 10mg  daily 2. Continue all your medications 3. Control of blood pressure, cholesterol, and diabetes are important for brain health 4. Physical exercise and brain stimulation exercises keep the brain healthy 5. For any change in symptoms (weakness on one side, speech difficulty), go to ER immediately 6. Follow-up in 1 year

## 2015-08-20 ENCOUNTER — Other Ambulatory Visit: Payer: Self-pay | Admitting: Pulmonary Disease

## 2015-08-22 ENCOUNTER — Encounter: Payer: Self-pay | Admitting: Pulmonary Disease

## 2015-08-22 ENCOUNTER — Ambulatory Visit (INDEPENDENT_AMBULATORY_CARE_PROVIDER_SITE_OTHER): Payer: Medicare Other | Admitting: Pulmonary Disease

## 2015-08-22 ENCOUNTER — Other Ambulatory Visit (INDEPENDENT_AMBULATORY_CARE_PROVIDER_SITE_OTHER): Payer: Medicare Other

## 2015-08-22 ENCOUNTER — Ambulatory Visit (INDEPENDENT_AMBULATORY_CARE_PROVIDER_SITE_OTHER)
Admission: RE | Admit: 2015-08-22 | Discharge: 2015-08-22 | Disposition: A | Discharge status: Home / Self Care | Payer: Medicare Other | Source: Ambulatory Visit | Attending: Pulmonary Disease | Admitting: Pulmonary Disease

## 2015-08-22 VITALS — BP 132/60 | HR 65 | Ht 66.0 in | Wt 173.0 lb

## 2015-08-22 DIAGNOSIS — Q631 Lobulated, fused and horseshoe kidney: Secondary | ICD-10-CM

## 2015-08-22 DIAGNOSIS — I6523 Occlusion and stenosis of bilateral carotid arteries: Secondary | ICD-10-CM | POA: Diagnosis not present

## 2015-08-22 DIAGNOSIS — R413 Other amnesia: Secondary | ICD-10-CM

## 2015-08-22 DIAGNOSIS — I679 Cerebrovascular disease, unspecified: Secondary | ICD-10-CM | POA: Diagnosis not present

## 2015-08-22 DIAGNOSIS — C641 Malignant neoplasm of right kidney, except renal pelvis: Secondary | ICD-10-CM

## 2015-08-22 DIAGNOSIS — I10 Essential (primary) hypertension: Secondary | ICD-10-CM

## 2015-08-22 DIAGNOSIS — Z794 Long term (current) use of insulin: Secondary | ICD-10-CM

## 2015-08-22 DIAGNOSIS — E7800 Pure hypercholesterolemia, unspecified: Secondary | ICD-10-CM

## 2015-08-22 DIAGNOSIS — I63032 Cerebral infarction due to thrombosis of left carotid artery: Secondary | ICD-10-CM | POA: Diagnosis not present

## 2015-08-22 DIAGNOSIS — N289 Disorder of kidney and ureter, unspecified: Secondary | ICD-10-CM

## 2015-08-22 DIAGNOSIS — E119 Type 2 diabetes mellitus without complications: Secondary | ICD-10-CM

## 2015-08-22 LAB — CBC WITH DIFFERENTIAL/PLATELET
BASOS ABS: 0.1 10*3/uL (ref 0.0–0.1)
Basophils Relative: 0.7 % (ref 0.0–3.0)
Eosinophils Absolute: 0.3 10*3/uL (ref 0.0–0.7)
Eosinophils Relative: 3.7 % (ref 0.0–5.0)
HCT: 32.9 % — ABNORMAL LOW (ref 39.0–52.0)
HEMOGLOBIN: 11.1 g/dL — AB (ref 13.0–17.0)
LYMPHS ABS: 1.2 10*3/uL (ref 0.7–4.0)
Lymphocytes Relative: 13.9 % (ref 12.0–46.0)
MCHC: 33.8 g/dL (ref 30.0–36.0)
MCV: 94 fl (ref 78.0–100.0)
MONO ABS: 0.6 10*3/uL (ref 0.1–1.0)
MONOS PCT: 7.4 % (ref 3.0–12.0)
NEUTROS PCT: 74.3 % (ref 43.0–77.0)
Neutro Abs: 6.5 10*3/uL (ref 1.4–7.7)
Platelets: 188 10*3/uL (ref 150.0–400.0)
RBC: 3.5 Mil/uL — AB (ref 4.22–5.81)
RDW: 12.7 % (ref 11.5–15.5)
WBC: 8.7 10*3/uL (ref 4.0–10.5)

## 2015-08-22 LAB — COMPREHENSIVE METABOLIC PANEL
ALBUMIN: 4.1 g/dL (ref 3.5–5.2)
ALK PHOS: 57 U/L (ref 39–117)
ALT: 15 U/L (ref 0–53)
AST: 15 U/L (ref 0–37)
BILIRUBIN TOTAL: 0.3 mg/dL (ref 0.2–1.2)
BUN: 53 mg/dL — ABNORMAL HIGH (ref 6–23)
CALCIUM: 9.9 mg/dL (ref 8.4–10.5)
CO2: 21 mEq/L (ref 19–32)
CREATININE: 1.43 mg/dL (ref 0.40–1.50)
Chloride: 111 mEq/L (ref 96–112)
GFR: 51.13 mL/min — ABNORMAL LOW (ref >60.00)
Glucose, Bld: 142 mg/dL — ABNORMAL HIGH (ref 70–99)
Potassium: 5.5 mEq/L — ABNORMAL HIGH (ref 3.5–5.1)
Sodium: 144 mEq/L (ref 135–145)
Total Protein: 6.3 g/dL (ref 6.0–8.3)

## 2015-08-22 LAB — TSH: TSH: 2.5 u[IU]/mL (ref 0.35–4.50)

## 2015-08-22 LAB — PSA: PSA: 1.01 ng/mL (ref 0.10–4.00)

## 2015-08-22 MED ORDER — ALPRAZOLAM 0.5 MG PO TABS: 0.5000 mg | ORAL_TABLET | Freq: Every evening | ORAL | Status: DC | PRN

## 2015-08-22 NOTE — Progress Notes (Signed)
Subjective:    Patient ID: Peter Nicholson, male    DOB: 1940/11/19, 75 y.o.   MRN: [id number]  HPI 75 y/o WM here for a follow up visit... he has multiple medical problems as noted below...  ~  SEE PREV EPIC NOTES FOR OLDER DATA >>    Hx OSA but he's been off CPAP x yrs & denies sleep disordered breathing, denies snoring, feels he rests well, & wakes refreshed w/o daytime hypersomnolence but he takes a nap every day...  known severe extracranial cerebrovasc dis w/ left CAE 2000 by [facility] & known severe bilat cavernous ICA stenoses; he is also s/p vertebral art angioplasty x2 by DrTDeveshwar w/ known basilar art occlusion  he has severe intracranial atherosclerotic dis and chr cerebellar infarcts on prev scans; memory problems felt to be mild cognitive impairment by DrSethi on eval 3/14 (but he is clearly high risk for vascular dementia problems)...   LABS 10/14 in Epic> REVIEWED.Marland KitchenMarland Kitchen   LABS 11/14:  Chems- OK x BS=182, A1c=8.3, BUN=29, Cr=1.2.Marland KitchenMarland Kitchen   LABS 8/15:  Chems- ok w/ BS=167, A1c=7.3, Cr=1.2.Marland KitchenMarland Kitchen   ~  April 19, 2014:  68moROV & s/p 2 recent hospitalizations..Marland KitchenMarland Kitchen    1) Adm 11/15- 02/28/14 by [facility] when he presented w/ dysarthria; hx severe vasc dis w/ known severe extracranial cerebrovasc dis w/ left CAE 2000 by DrLawson & known severe bilat cavernous ICA stenoses; he is also s/p vertebral art angioplasty x2 by DrTDeveshwar w/ known basilar art occlusion; he was taking meds on his own & not monitored by family- he had stopped his Plavix (confused, didn't remember), ?DM meds & Simva; Hosp eval included CT Head & MRI/MRA Brain showing old cerebellar & thalamic infarcts and small vessel dis, acute sm nonhemorrhaghic infarcts throughout the left frontal/ temporal/ & parietal areas; Left ICA is occluded (reconstitution of flow in supraclinoid region appears inadeq), mod stenosis of Right ICA cavernous segment; occluded right vertebral and right PICA; high grade stenosis of Left vertebral art & mod  narrowing of prox basilar art- see full report!  Subseq CT Angio Head & Neck showed prev Left CAE markedly attenuated & occluded at skullbase/ petrous segment of the left ICA, diminutive right vertebral that occludes at the skullbase, dominant left vertebral w/ hi-grade stenosis distally, diffuse atherosclerotic changes... 2DEcho was neg for embolic source... He was placed back on ASA/Plavix & no intervention recommended, reminded about need for strict regulation of BP, Chol, DM... Speech improved in hArtesiafor outpt f/u w/ DrSethi (he has not returned)..Marland KitchenMarland Kitchen   2) Adm 12/27- 04/13/14 by [facility] w/ confusion, ataxia, & speech difficulty w/ BS found to be 666 (this is when it was discovered that he was taking his own meds without supervision & several meds hadn't been filled in months); he had an Enterococcus UTI- treated w/ Augmentin; repeat MRI/MRA Brain w/o change from Nov; [facility] performed and attempted revasc of the left ICA was attempted but unsuccessful, he was disch home on same meds (ASA/Plavix)- see below- w/ supervision of all meds from wife; Lisinopril was held & they rec keeping BP in the 130-160 range...     We reviewed the following medical problems during today's office visit >>     OSA> he's been off CPAP x yrs & denies sleep disordered breathing, denies snoring, feels he rests well, & wakes refreshed w/o daytime hypersomnolence but he takes a nap every day...    Borderline HBP> not  currently on meds, just diet controlled; BP= 132/80 & reminded to avoid sodium & get weight down; he is essentially asymptomatic w/o CP, palpit, SOB, edema, etc...    Cerebrovasc dis> back on his ASA81 & Plavix75 (he had stopped on his own- now supervised by wife); known severe extracranial cerebrovasc dis w/ left CAE 2000 by [facility] & known severe bilat cavernous ICA stenoses (occluded left); he is also s/p vertebral art angioplasty x2 by Azell Der w/  known basilar art occlusion & severe intracranial atherosclerotic dis; Scans 11/15 revealed acute watershed infarcts in left frontal/ temporal/ parietal lobes & old infarcts in cerebellum & thalamus; DrTDeveshwar attempted intervention to left carotid system but this was reported as unsuccessful (unsucceeful attempt at revascularization of occluded LT ICA cavernous segment, multiple collaterals noted in the LT ICA cav occluded segment)...    Chol> on Lip80 now + diet; FLP 12/15 on Simva40 w/ ?compliance showed TChol 175, TG 105, HDL 41, LDL 113 => changed to Atorva80.    DM> on Lantus15u now, Metform500-2Bid, Diabeta5AM, Onglyza5PM; compliance w/ meds was poor when taking his own meds; Labs 12/15 showed BS=146-665, A1c=9.3 => now all supervised by wife...    Overweight> weight = 181# and BMI~29; we reviewed diet, exercise, wt reduction strategies...    GI- GERD, divertics> on Protonix40 daily; he had EGD 11/13 showing HH, chr GERD, stricture dil w/ 41F Maloney dilator, erosive gastritis was HPylori neg & treated w/ daily PPI rx.    GU- Hx horseshoe kidney, renal cell cancer, nephrolithiasis> right side of horseshoe kid removed 1994 (RCCa); followed by Urology for yrs & no recurrence; Creat in the 1.1-1.5 range; PSA in the 1-3 range...    DJD, Gout, LBP> on Allopurinol300, Tramadol50, Nortrip 54m; followed by DrElsner & DrHarkins on ESI injections & Nortrip rx, last ?June2014 & stable at present, prev mowing, yard work, cBarista etc...    Neuro- memory impairment> on ASA81 & Plavix75, plus MVI etc; he has severe intracranial atherosclerotic dis, chr cerebellar infarcts on prev scans, & new watershed infarcts on left 11/15; memory problems felt to be mild cognitive impairment by DrSethi on eval 3/14, vascular dementia in light of numerous sm infarcts on scans...     Anxiety> on Xanax0.5 prn & Zoloft50...  We reviewed prob list, meds, xrays and labs> see below for updates >>   XRays, Scans,  Angiograpy, Labs from 11-12/20015 all reviewed w/ pt & wife...  ~  May 25, 2014:  136moOV & here for DM recheck> on Lantus15, Metform500-2Bid, Diabeta5Qam, Onglyza5Qpm; all meds supervised by wife now; they state accuchecks are up & down (55-95 on the low, and 190-350 on the high);  Labs today showed BS=96, A1c improved to 7.6 => we decided to continue same meds & diet, ROV 3 months...  In addition his BP is sl elev 170/80 by nurse and 150/80 by me; wife states that Neuro wanted his BP sl higher due to severe vasc dis & perfusion concerns;  We decided to add LOSARTAN50 for his BP & vasc dis;  Continue other meds the same...   ~  Aug 23, 2014:  71m79moV & Sheppard reports doing well, no complaints or concerns;  Wife is very concerned regarding his memory & behavior changes and she is sched forback surg soon; requesting Neuro 2nd opinion for help w/ these problems... We reviewed the following medical problems during today's office visit >>     HBP> we added Losar50 last OV; BP= 144/76 & reminded to  avoid sodium & get weight down; he is essentially asymptomatic w/o CP, palpit, SOB, edema, etc...    Cerebrovasc dis> on his ASA81 & Plavix75 (he had stopped on his own- now supervised by wife); known severe extracranial cerebrovasc dis w/ left CAE 2000 by [facility] & known severe bilat cavernous ICA stenoses (occluded left); he is also s/p vertebral art angioplasty x2 by Azell Der w/ known basilar art occlusion & severe intracranial atherosclerotic dis; Scans 11/15 revealed acute watershed infarcts in left frontal/ temporal/ parietal lobes & old infarcts in cerebellum & thalamus; DrTDeveshwar attempted intervention to left carotid system but this was reported as unsuccessful (unsucceeful attempt at revascularization of occluded LICA cavernous segment w/ multiple collaterals noted)...    Chol> on Lip80 now + diet; FLP 5/16 showed TChol 113, TG 51, HDL 48, LDL 54 => much improved, continue the same.    DM> on  Lantus15u now, Metform500-2Bid, Diabeta5AM, Onglyza5PM; compliance w/ meds was poor but wife supervises now; Labs 5/16 shows BS=130, A1c=6.8 => much improved, continue same.    GU- Hx horseshoe kidney, renal cell cancer, nephrolithiasis> right side of horseshoe kid removed 1994 (RCCa); followed by Urology for yrs & no recurrence; Creat in the 1.1-1.5 range; PSA in the 1-3 range...    Neuro- memory impairment> on ASA81 & Plavix75, plus MVI etc; he has severe intracranial atherosclerotic dis, chr cerebellar infarcts on prev scans, & new watershed infarcts on left 11/15; memory problems felt to be mild cognitive impairment by DrSethi on eval 3/14, vascular dementia in light of numerous sm infarcts on scans; wife wants 2nd opinion Neuro & we will refer...    Anxiety/Depression> on Xanax0.5 prn, Pamelor25,  & Zoloft50...     B12 deficiency> Labs 5/16 showed B12 level = 196 & rec to add 10617mg VitB12 OTC to his MVI daily... We reviewed prob list, meds, xrays and labs> see below for updates >>   LABS 5/16:  FLP- at goals on Atorva80;  Chems- ok w/ BS=130, A1c=6.8, Cr=1.27;  CBC- ok w/ Hg=12.4;  B12 level= 196...  PLAN>> BP & DM control much improved on current meds w/ wife supervising his meds Rx; FLP looks good on Atorva80; severe vasc dis w/ mult interventions and prob multi-infarct dementia- we will refer to Neuro for 2nd opinion per request; Note Vit B12 is low & we will add 100102m B12 to his vitamin regimen...  ~  December 31, 2014:  17m42moV & add-on appt requested for "congestion"; pt had some difficulty remembering why he was here, then noted 2d hx sinus congestion, drainage, blowing sl beige mucus, assoc w/ cough prod of a sm amt of similar beige sput; denies f/c/s, CP, SOB...     BP is better regulated w/ him on the Losar50; BP= 120/60 today & he denies CP, palpit, SOB, edema, etc...    After his last visit [facility] noting memory loss, MOCA=20/30, hx of severe vasc  dis etc; they started Aricept5=>10, MVI, and B12 supplement; also rec physical exercise & brain stimulation thru puzzles etc.    He also had a f/u w/ DrDeveshwar from IR 7/16> MRI/MRA of the Brain revealed chronic ischemicchanges stable from his prev scans, neg for acute infarcts; chr occlusion of left ICA, mod-severe stenosis of the right cavernous carotid, occlusion of the distal right vertebral art, mod-severe stenosisof the distal left vertebral as well; he is continuing to follow the pt... EXAM showed Afeb, VSS w/ BP=120/60;  HEENT- neg, mallampati2;  Chest- clear w/o w/r/r;  Heart- RR, w/o m/r/g;  Abd- soft, neg;  Ext- neg w/o c/c/e... IMP/PLAN>>  Peter Nicholson appears to have a mild URI w/ some sinus congestion & drainage;  He is a diabetic so we will try to avoid Pred and treat w/ ZPAK, [place], DELSYM, OTC antihist prn as well;  They request TRAMADOL 26m for pain- ok... He has a regular ROV sched for Nov.   ~  February 25, 2015:  276moOV & medical recheck> Prev congestion from 9/16 OV has resolved w/ Zpak, Mucinex, Delsym; currently reports doing satis but wife reports BS up&down 5-200; he is too sedentary- not exercising at all, just watching TV, wife says he won't read/ work puzzles/ do anything substantive regarding physical or mental exercises... We reviewed the following medical problems during today's office visit >>     OSA> he's been off CPAP x yrs & denies sleep disordered breathing, denies snoring, feels he rests well, & wakes refreshed w/o daytime hypersomnolence but he takes a nap every day...    HBP> on Losartan50; BP= 126/68 & reminded to avoid sodium & get weight down; he is essentially asymptomatic w/o CP, palpit, SOB, edema, etc...    Cerebrovasc dis> on his ASA81 & Plavix75 (meds supervised by wife); known severe extracranial cerebrovasc dis [facility]/ [facility] severe bilat cavernous ICA stenoses (occluded left); he is also s/p vertebral art angioplasty x2 by DrAzell Derw/ known basilar art occlusion & severe intracranial atherosclerotic dis; Scans 11/15 revealed acute watershed infarcts in left frontal/ temporal/ parietal lobes & old infarcts in cerebellum & thalamus; DrTDeveshwar attempted intervention to left carotid system but this was reported as unsuccessful (unsucceeful attempt at revascularization of occluded LICA cavernous segment w/ multiple collaterals noted)...    Chol> on Lip80 now + diet; FLP 5/16 showed TChol 113, TG 51, HDL 48, LDL 54 => much improved, continue the same.    DM> on Lantus15u, Metform500-2Bid, Diabeta5AM, Onglyza5PM; compliance w/ meds was poor but wife supervises now; Labs 5/16 shows BS=130, A1c=6.8 => Labs 11/16 showed BS=226, A1c=6.7, continue same meds, better diet.     GU- Hx horseshoe kidney, renal cell cancer, nephrolithiasis> right side of horseshoe kid removed 1994 (RCCa); followed by Urology for yrs & no recurrence; Creat in the 1.1-1.5 range; PSA in the 1-3 range...    Neuro- memory impairment> on ASA81 & Plavix75, plus MVI etc; he has severe intracranial atherosclerotic dis, chr cerebellar infarcts on prev scans, & new watershed infarcts on left 11/15; memory problems felt to be mild cognitive impairment by DrSethi on eval 3/14, vascular dementia in light of numerous sm infarcts on scans; wife wants 2nd opinion Neuro & [facility] noting memory loss, MOCA=20/30, hx of severe vasc dis etc; they started Aricept5=>10, MVI, and B12 supplement; also rec physical exercise & brain stimulation thru puzzles etc.    Anxiety/Depression> on Xanax0.5 prn & Zoloft50...     B12 deficiency> Labs 5/16 showed B12 level = 196 & rec to add 100041mVitB12 OTC to his MVI daily; Labs 11/16 showed B12=426... EXAM shows Afeb, VSS w/ BP=126/68 & O2sat=98% on RA;  HEENT- neg, mallampati2;  Chest- clear w/o w/r/r;  Heart- RR, w/o m/r/g;  Abd- soft, neg;  Ext- neg w/o c/c/e...  LABS 02/25/15 showed BS=226, A1c=6.7, K=5.5,  Cr=1.15, B12=426... IMP/PLAN>>  Problems as listed- control of BP, BS is reasonable & could be better w/ diet/ exercise/ wt reduction; reviewed w/  pt & wife, given 2016 Flu vaccine today.  ~  March 11, 2015:  2wk add-on appt requested to check sebaceous cyst on his back> quarter sized, sl red, min tender in mid-back; showed up over the past 1-2 wks he says, no drainage, denies f/c/s, never had prev cysts... EXAM shows Afeb, VSS, O2sat=99% on RA;  Sebaceous cyst in mid back- sebum expressed, not foul, no adenopathy, etc... REC>>  Clean w/ mild soapy water Bid, cover w/ neosporin/ dry gauze/ & paper tape, Rx Keflex 500 Tis x7d and call if not responding for referral to general surg for drainage...  ~  Aug 22, 2015:  55moROV & Oseas indicates that he is doing well, no new complaints or concerns- and his wife confirms; due for f/u CXR & full labs today but he is not fasting, therefore we will elim the FLP & check everything else; they request refill of Alpraz0.5=> done... We reviewed the following medical problems during today's office visit >>     OSA> he's been off CPAP x yrs & denies sleep disordered breathing, denies snoring, feels he rests well, & wakes refreshed w/o daytime hypersomnolence but he takes a nap every day...    HBP> on Losartan50; BP= 132/60 & reminded to avoid sodium & keep weight down; he is essentially asymptomatic w/o CP, palpit, SOB, edema, etc...    Cerebrovasc dis> on his ASA81 & Plavix75 (meds supervised by wife); known severe [facility]/ [facility] known severe bilat cavernous ICA stenoses (occluded left); he is also s/p vertebral art angioplasty x2 by DAzell Derw/ known basilar art occlusion & severe intracranial atherosclerotic dis; Scans 11/15 revealed acute watershed infarcts in left frontal/ temporal/ parietal lobes & old infarcts in cerebellum & thalamus; DrTDeveshwar attempted intervention to left carotid system but this was reported  as unsuccessful (unsucceeful attempt at revascularization of occluded LICA cavernous segment w/ multiple collaterals noted);  Last saw DrTDeveshwar 10/2014- note reviewed & 7/16 MRI/MRA & catheter angiogram reviewed=> tight focal stenosis of the right internal carotid artery distal cavernous segment, and the left vertebrobasilar junction suggestive of interval worsening. However no parenchymal changes suggestive of intervening or acute ischemic changes were noted on the DWI images- therefore they opted for continued medical management.    Chol> on Lip80 now + diet; last FLP 5/16 showed TChol 113, TG 51, HDL 48, LDL 54 => much improved, continue the same; reminded to come fasting for f/u FLP.    DM> wt is down 11# to 173# today; on Lantus15u now, Metform500-2Bid, Diabeta5AM, Onglyza5PM; compliance w/ meds was poor but wife supervises now; Labs 5/16 shows BS=130, A1c=6.8 => Labs 11/16 showed BS=226, A1c=6.7 => Labs 5/17 showed BS=142 & A1c wasn't done; Rec to continue same meds, good job w/ wt loss...    GU- Hx horseshoe kidney, renal cell cancer, nephrolithiasis> right side of horseshoe kid removed 1994 (RCCa); followed by Urology for yrs & no recurrence; Creat in the 1.1-1.5 range; PSA in the 1-3 range; Labs 5/17 showed BUN=53, Cr=1.43, PSA=1.01; Rec to incr water intake...    Neuro- memory impairment> on ASA81 & Plavix75, plus MVI etc; he has severe intracranial atherosclerotic dis, chr cerebellar infarcts on prev scans, & new watershed infarcts on left 11/15; memory problems felt to be mild cognitive impairment by DrSethi on eval 3/14, vascular dementia in light of numerous sm infarcts on scans; wife wants 2nd opinion Neuro & [facility] loss, MOCA=20/30, hx  of severe vasc dis etc; they started Aricept5=>10, MVI, and B12 supplement; also rec physical exercise & brain stimulation thru puzzles etc; Last f/u DrAquino 08/02/15- reviewed, tolerating Aricept, stable & independent w/  ADLs, drives a little, IFOY=77/41, no ch in meds.     Anxiety/Depression> on Xanax0.5 prn & Zoloft50...     B12 deficiency> Labs 5/16 showed B12 level = 196 & rec to add 10108mg VitB12 OTC to his MVI daily; Labs 11/16 showed B12=426...    Mild Anemia>  Labs 5/17 showed Hg=11.1, MCV=94;  Rec to add 1-a-day w/ Fe to his B12 supplement EXAM shows Afeb, VSS w/ BP=126/68 & O2sat=98% on RA;  HEENT- neg, mallampati2;  Chest- clear w/o w/r/r;  Heart- RR, w/o m/r/g;  Abd- soft, neg;  Ext- neg w/o c/c/e...  CXR 08/22/15 showed norm heart size, atherosclerotic Ao, clear lungs, NAD; arthritis in Tspine...   LABS 5/17>  FLP- not done;  Chems- ok x BUN=53, Cr=1.43, BS=142 (rec to incr free water + low carb diet);  CBC- ok x Hg=11.1, MCV=94 (rec to add 1-a-day w/ Fe to his B12 supplement daily);  TSH=2.50;  PSA=1.01... IMP/PLAN>>  JNoahis mostly stable- he's followed by Neuro & IR for his cerebrovasc dis & MCI on Aricept;  Medically improved w/ wt reduction- good job!  Rec to continue same meds regularly, diet, exercise, drink more free water, & add 1-a-ady w/ Fe to his B12 vit daily...          Problem List:     Hearing Loss >> [facility] & rec to consider hearing aides...   OBSTRUCTIVE SLEEP APNEA (ICD-327.23) - s/p split night sleep study 11/08- showing an AHI of 19, assoc w/ an O2 sat down to 78%... during the CPAP trial he titrated up to a pressure of 11cm but never ret to REM sleep so optimal pressure is still ???...he uses [facility] for his CPAP needs... ~  12/10: he tells me that he has stopped the CPAP, that he sleeps better without it, not snoring/ rests well, & wakes feeling refreshed... he refuses Sleep Med re-eval etc... ~  11/14: he's been off CPAP x yrs & denies sleep disordered breathing, denies snoring, feels he rests well, & wakes refreshed w/o daytime hypersomnolence, he naps daily... ~  2015> he reports stable & denies sleep  disordered breathing...  Hx of HYPERTENSION >>   ~  not currently on medications =>  ~  6/12: BP= 136/80 off meds & denies HA, visual changes, CP, palipit, dizziness, syncope, dyspnea, edema, etc... he states that home BP checks are "OK". ~  CXR 11/12 showed borderline heart size, tort Ao, clear lungs w/ ?underlying COPD, NAD...  ~  1/13: [place]; both stopped 1/13 by TP w/ renal insuffic & elev K+; subseq improved & BP stable... ~  2/13: BP= 112/68 & feeling weak etc...  3/13:  BP= 140/72 & feeling some better... ~  6/13:  BP= 138/78 & feeling much better; denies CP, palpit, ch in SOB or edema... ~  10/13:  BP= 140/82 & he is c/o reflux & dysphagia today, no CP/ palpit/ SOB/ edema... ~  11/14: not currently on meds, just diet controlled; BP= 140/86 & reminded to avoid sodium & get weight down; he is essentially asymptomatic w/o CP, palpit, SOB, edema, etc. ~  2/15: still not on meds, just diet controlled;  BP= 124/76 & he remains asymptomatic...  ~  8/15: not currently on meds, just diet controlled; BP= 142/84 & reminded to avoid sodium & get weight down; he is essentially asymptomatic w/o CP, palpit, SOB, edema, etc ~  1/16: still not on meds, just diet controlled; BP= 132/80 & Neuro has rec BP range of 130-160 to aid perfusion due to his severe cerebrovasc dis... ~  2/16: BP 170/80 => recheck 150/80 range; we decided to add Losartan 18m/d for BP & vasc dis... ~  5/16: on Losartan50; BP= 144/76 & he says he's asymptomatic, continue same... ~  11/16: on Losartan50; BP= 126/68 & reminded to avoid sodium & get weight down; he is essentially asymptomatic w/o CP, palpit, SOB, edema, etc... ~  5/17: on Losartan50; BP= 132/60 & reminded to avoid sodium & keep weight down; he remains largely asymptomatic.  CEREBROVASCULAR DISEASE (ICD-437.9) - stable on ASA 3214md & PLAVIX 7550m per DrDeveshwar/ DrLawson/ DrReynolds... he has severe extracranial  cerebrovasc disease- esp in the post circulation, and he is s/p vertebral art angioplasty x 2, and w/ a known basilar art occlusion- prev on Coumadin controlled by Neurology (switched to ASA/ Plavix in 2010)... also followed by DrLSheryn Bisond DrDeveshwar for IR... [facility]  CD[facility]ler 2/07 showed 0-39% bilat ICA stenoses, s/p left CAE w/ DPA... ~  eval 3/10 by DrLSheryn Bison CDopplers that showed similar patency w/ 0-39% bilat ICA stenoses; due to the 10 yr interval DrLawson did not feel he needed any additional follow up. ~  12/10: continued f/u DrDevershwar IR w/ MRI/ MRAs showing bilat carotid siphon region atherosclerosis & narrowing (unchanged), and markedly diseased distal left vertebral & prox basilar art narrowing (no change)... ~  11/12: CTA head & neck showed similar severe dis w/ mod to severe bilat cavernous ICA stenoses due to extensive atherosclerosis; severe distal vertebral atherosclerosis- distal right occluded, distal left w/ high grade stenosis; mod to severe bilat PCA stenoses; chr cerebellar infarcts, no acute infarcts. ~  11/14: on ASA81 & Plavix75; known severe extracranial cerebrovasc dis w/ left CAE 2000 by DrLawson & known severe bilat cavernous ICA stenoses; he is also s/p vertebral art angioplasty x2 by DrTDeveshwar w/ known basilar art occlusion ~  8/15: on ASA81 & Plavix75; known severe extracranial cerebrovasc dis w/ left CAE 2000 by DrLawson & known severe bilat cavernous ICA stenoses; he is also s/p vertebral art angioplasty x2 by DrTDeveshwar w/ known basilar art occlusion, he endorses medication compliance & denies cerebral ischemic symptoms... ~  11/15: Hosp by [facility] when he presented w/ dysarthria; hx severe vasc dis w/ known severe extracranial cerebrovasc dis w/ [facility] cavernous ICA stenoses; he is also s/p vertebral art angioplasty x2 by DrTDeveshwar w/ known basilar art occlusion; he was taking meds on  his own & not monitored by family- he had stopped his Plavix (confused, didn't remember), ?DM meds & Simva; Hosp eval included CT Head & MRI/MRA Brain showing old cerebellar & thalamic infarcts and small vessel dis, acute sm nonhemorrhaghic infarcts throughout the left frontal/ temporal/ & parietal areas; Left ICA is occluded (reconstitution of flow in supraclinoid region appears inadeq), mod stenosis of Right ICA cavernous segment; occluded right vertebral and right PICA; high grade stenosis of Left vertebral art & mod narrowing of prox basilar art- see full report!  Subseq CT Angio Head & Neck showed prev Left CAE markedly attenuated & occluded at skullbase/ petrous  segment of the left ICA, diminutive right vertebral that occludes at the skullbase, dominant left vertebral w/ hi-grade stenosis distally, diffuse atherosclerotic changes... 2DEcho was neg for embolic source... He was placed back on ASA/Plavix & no intervention recommended, reminded about need for strict regulation of BP, Chol, DM... Speech improved in [place] for outpt f/u w/ DrSethi (he did not return)... ~  12/15: Hosp by [facility] w/ confusion, ataxia, & speech difficulty w/ BS found to be 666 (this is when it was discovered that he was taking his own meds without supervision & several meds hadn't been filled in months); he had an Enterococcus UTI- treated w/ Augmentin; repeat MRI/MRA Brain w/o change from Nov; he was re-evaluated by [facility] for IR- cerebral angiography performed and attempted revasc of the left ICA was attempted but unsuccessful, he was disch home on same meds (ASA/Plavix) w/ supervision of all meds from wife; Lisinopril was held & they rec keeping BP in the 130-160 range...  ~  5/16: he has severe vascular dis & wife is concerned about memory loss & behavioral changes- continue ASA/ Plavix & she requests Neuro 2nd opinion... 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 However no parenchymal changes suggestive of intervening or acute ischemic changes were noted on the DWI images- therefore they opted for continued medical management. ~  5/17: he remains on ASA81 + Plavix75 => clinically stable w/ his severe cerebrovasc dis...  VENOUS INSUFFICIENCY (ICD-459.81) - he knows to avoid sodium, elevate legs, wear support hose...  HYPERCHOLESTEROLEMIA (ICD-272.0) - on CRESTOR 29m/d since Hosp 12/12... ~  FMeade6/08 showed TChol 153, TG 120, HDL 36, LDL 93 ~  FLP 8/09 showed TChol 131, TG 84, HDL 38, LDL 76 ~  2010:  pt was not fasting for blood work... rec to continue Crestor10 + diet efforts. ~  FLP 10/11 showed TChol 210, TG 161, HDL 40, LDL 149... rec to start Crestor 247md, he will decide. ~  FLP 6/12 on Cres20 showed TChol 106, TG 95, HDL 41, LDL 46... Continue same. ~  [place] [facility] showed TChol 100, TG 101, HDL 37, LDL 43... They decr to Cres10. ~  FLP 3/13 on Cres10 showed TChol 118, TG 67, HDL 44, LDL 61... Continue same, later switched to Simva40 for $$ reasons. ~  FLP 10/14 on Simva40 showed TChol 136, TG 54, HDL 49, LDL 77... Continue  same. ~  FLP 12/15 on Simva40 w/ ?compliance showed TChol 175, TG 105, HDL 41, LDL 113 => [place] supervision of all meds by wife. ~  FLP 5/16 on Atorva80 showed TChol 113, TG 51, HDL 48, LDL 54 => much improved, continue the same.  DIABETES MELLITUS (ICD-250.00) >>  ~  on METFORMIN  564m- 2Bid, GLYBURIDE 518mid, ACTOS 3013m, & ONGLYZA 5mg45m.. ~  labs 6/08 showed BS= 155, HgA1c= 8.1... (Marland Kitchenlyburide incr to 5Bid). ~  labs 11/08 showed BS= 352, HgA1c= 8.1.... (Actos added). ~  labs 8/09 showed BS= 128, HgA1c= 7.1... rMarland KitchenMarland Kitchen- same meds, better diet, get wt down! ~  labs 2/10 showed BS= 249, A1c= 7.3.... Metform incr to 2Bid. ~  labs 12/10 (nonfasting- on all 3 meds) showed BS= 87, A1c= 6.9 ~  labs 10/11 on Metform1000Bid+Gybur5Bid showed BS= 180, A1c= 8.4... rMarland KitchenMarland Kitchen to incr GLYBUR10Bid + diet. ~  labs 6/12 on Metform1000Bid+Glybur10Bid showed BS= 196, A1c= 11.2 ?what happened?> he declined insulin & Endocrine consult, opted for max oral meds by adding ACTOS 30mg63m ONGLYZA 5mg/d67m ~  [facility] 150[facility]200[facility] and A1c= 7.2; covered w/ SSI in hosp & placed back on 4 oral meds on disch from rehab... ~  Labs 1/13 on all 4 meds showed BS= 230, A1c=7.1; we reviewed diet & he will start PT soon... ~  Labs 3/13 on 4meds 62mwed BS= 72, A1c= 6.6 ~  4/13: pt called w/ hypoglycemia & we decreased Glyburide 5mg fro53mBid to 1Bid... ~  6/13:  Pt on Metform500-2Bid, Glybur5mgBid, 26mos30, Onglyza5; BS at home 70-90 he says & still drinking Boost; Labs showed BS=190  A1c=7.4  ; we decided to cut the Glyburide5mg Qam o19m but keep the Metform500-2Bid +Actos +Onglyza... ~  10/13: on his 4meds> BS=72m, A1c=7.7 but he's gained 16#; told to STOP the boost, get on diet, get wt down, take all meds every day... ~  10/14: seen by TP> Labs 10/14 showed BS=252, A1c=9.1 & Lantus added to his Metform500-2Bid, Diabeta5, Onglyza5; Lantus10u added... ~  11/14: on Lantus10u, Metform500-2Bid, Diabeta5, Onglyza5,  off Actos; f/u labs 11/14 showed BS=182, A1c=8.3; continue same, get wt down. ~  8/15: on Lantus10u, Metform500-2Bid, Diabeta5, Onglyza5; Labs 8/15 showed BS=167, A1c=7.3... ContinueMarland KitchenMarland Kitcheneds and get wt down. ~  12/15: now on Lantus15u now, Metform500-2Bid, Diabeta5AM, Onglyza5PM; compliance w/ meds was poor when taking his own meds; Labs 12/15 showed BS=146-665, A1c=9.3 => now all supervised by wife. ~  2/16: on Lantus15u now, Metform500-2Bid, Diabeta5AM, Onglyza5PM; Labs 2/16 showed BS=96, A1c=7.6, therefore continue same meds & diet... ~  5/16: on Lantus15u, Metform500-2Bid, Diabeta5AM, Onglyza5PM; compliance w/ meds was poor but wife supervises now; Labs 5/16 shows BS=130, A1c=6.8 => much improved, continue same. ~  11/16: on Lantus15u, Metform500-2Bid, Diabeta5AM, Onglyza5PM; wife supervises meds; Labs 11/16 showed BS=226, A1c=6.7, continue same meds, better diet. ~  5/17: on same meds & doing satis;  Labs 5/17 showed random BS=142, A1c wasn't done as requested...  OVERWEIGHT (ICD-278.02) - he states that he is not on much of a diet & we reviewed low carb/ no sweets/ low fat diet. ~  weight 8/09 = 216# ~  weight 2/10 = 216# ~  weight 12/10 = 212# ~  weight 10/11 = 210# ~  Weight 6/12 = 206# and he admits to not being on much of a diet... ~  Weight 2/13 = non weight bearing still... ~  Weight 3/13 = 193# ~  Weight 6/13 = 191# ~  Weight 10/13 = 206# ~  Weight 11/14 = 198# ~  Weight 2/15 = 193# ~  Weight 1/16 = 181# ~  Weight 5/16 = 189# ~  Weight 11/16 = 189# ~  Weight 5/17 = 173# good job w/ wt reduction, diet, exercise...  GERD (ICD-530.81) - prev on Nexium40, he switched to OTC Prilosec, now  PROTONIX 33m/d due to Plavix Rx; pt had stopped this on his own & asked to restart 10/13. ~  EGD 19/03 showed 4Roundup GERD & esoph stricture dilated... ~  EGD 12/09 by DrPatterson showed HH & severe erosive esophagitis from GERD... ~  10/13: presents w/ incr reflux- w/ dysphagia & food sticking in  mid-esoph that occurs every 2-3days & has to vomit to feel better; he had stopped his PPI on his own (?why?), rec to restart the Protonix40 & we will refer to GI for EGD & dilatation... ~  10/13: he saw DrPatterson> EGD 11/13 showed HH, chr GERD, stricture dil w/ 89F Maloney dilator, erosive gastritis was HPylori neg & treated w/ daily PPI rx...  ~  He remains on Protonix40/d & doing satis w/o reflux symptoms...  DIVERTICULOSIS OF COLON (ICD-562.10) - note: [place]; prev on MAnascobut recently noted loose stools on the Metformin 10076mid... ~  colonoscopy 10/03 by DrPatterson showed divertics only... ~  colonoscopy 12/09 by DrPatterson was also normal x for divertics... ~  He denies n/v, c/d, blood seen...  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 Hx of RENAL CELL CANCER (ICD-189.0) - he had a partial nephrectomy of the right side of his horseshoe kidney in 1994 due to renal cell ca... still followed by DrStartexearly by his hx but we don't have any recent notes... ~  labs 10/11 showed PSA= 0.95 ~  Labs 3/13 showed BUN=21, Creat=1.1, PSA=2.75 ~  Labs 10/13 showed BUN= 25, Creat= 1.2 ~  Labs 10/14 showed BUN=38, Cr=1.5 (w/ enterococcus UTI);  Labs 11/14 showed BUN=29, Cr=1.2 ~  Labs 8/15 showed BUN= 24, Cr= 1.2 ~  Labs 12/15 showed Cr= 1.1-1.4 ~  Labs 2/16 showed BUN= 18, Cr= 1.12  Hx of NEPHROLITHIASIS (ICD-592.0) - remote hx of kidney stones followed by DrPeterson...  DEGENERATIVE JOINT DISEASE (ICD-715.90) &  GOUT (ICD-274.9) - on ALLOPURINOL 30020m... ~  labs 10/11 showed URIC= 4.7 ~  [facility] after fall from ladder cleaning gutters w/ complex right tibial plat fx- ORIF, 2 operations by DrHardy, & hosp course complic by ileus, TIA, electrolyte abn, etc;  Event [facility] & [facility]  BACK PAIN, LUMBAR (ICD-724.2) - known degenerative  spondylitic disease at L4-5 and L5-S1 prev [facility] (now DrElsner & DrHarkins)... ~  6/12: he reports further back pain w/ eval by Chiropractor showing L4 disc prob & regular adjustments; but now notes incr pain & he requests referral to Neurosurg for further eval ==> seen by DrElsner, then DrHarkins for shots which has helped... ~  2014: he has continued f/u w/ DrHarkins[facility] & received periodic ESI injections for his Lumbar DDD; they added NORTRIPTYLINE 33m67m & slowly incr the dose  NEURO >> Hx Left Hemispheric TIA in 11/12 & MEMORY IMPAIRMENT ~  3/14:  He had Neuro eval DrSethi> he did Labs, EEG, MRI Brain, & MRA Brain & Neck; Rec to take 2 FishOil caps daily, CerefolinNAC daily, work puzzles etc, continue Plavix & strict control of BP, Chol, DM; they did not start Aricept... ~  11/15 & 12/15> see above> Hosp w/ wastershed strokes left frontal/ temporal/ parietal areas when he wasn't taking his Plavix; now all meds supervised by wife; he had MRIs, CT Angio, and Arteriograms w/ attempted intervention on left by DrTDeveshwar- unsuccessful & rec for continued  ASA/ Plavix, & control on BP, Chol, DM w/ BP in the 130-160 range... 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 ~  6/16: Eval by [facility], c/w mild cognitive impairment w/ MOCA=20/30, started on Aricept & B12 supplement... ~  4/17: f/u DrAquino 08/02/15- reviewed, tolerating Aricept, stable & independent w/ ADLs, drives a little, KDTO=67/12, no ch in meds.   ANXIETY (ICD-300.00) - uses Zolloft50 & ALPRAZOLAM 0.16m as needed.  B12 DEFICIENCY >>  ~  5/16:  Labs showed B12 = 196 & rec to start 10062m by mouth daily & we will f/u labs... ~  11/16: f/u labs showed B12 = 426, continue B12 supplement daily... ~  Labs 5/17:  CBC showed Hg= 11.1, MCV=94, and rec to add 1-a-day w/ Fe to the B12 daily  supplement.   Past Surgical History  Procedure Laterality Date  . Incisional hernia repair  1/96    Dr.Leone  . Nasal sinus surgery  12/96    Dr.Redman  . Cholecystectomy  9/04    Dr. LeRebekah [place]. 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 Carotid endarterectomy  09/2000    Dr.Lawson  . Veterbral art angioplasty      x2  . Small intestine surgery    . External fixation leg  03/10/2011    Procedure: EXTERNAL FIXATION LEG;  Surgeon: MiRozanna Box [facility];  Laterality: Right;  . Orif tibia plateau  03/24/2011    Procedure: OPEN REDUCTION INTERNAL FIXATION (ORIF) TIBIAL PLATEAU;  Surgeon: MiRozanna Box [place] [facility];  Laterality: Right;  . Radiology with anesthesia N/A 04/12/2014    Procedure: ANGIOPLASTY;  Surgeon: SaRob HickmanMD;  [facility];  Laterality: N/A;    Outpatient Encounter Prescriptions as of 08/22/2015  Medication Sig  . ALPRAZolam (XANAX) 0.5 MG tablet Take 0.5-1 tablets (0.25-0.5 mg total) by mouth 3 (three) times daily as needed.  . Marland Kitchenspirin EC 81 MG EC tablet Take 1 tablet (81 mg total) by mouth daily.  . Marland Kitchentorvastatin (LIPITOR) 80 MG tablet TAKE 1 TABLET BY MOUTH EVERY DAY AT 6PM  . clopidogrel (PLAVIX) 75 MG tablet TAKE 1 TABLET BY MOUTH EVERY DAY  . donepezil (ARICEPT) 10 MG tablet Take 1 tablet (10 mg total) by mouth daily.  . Marland KitchenlipiZIDE (GLUCOTROL XL) 5 MG 24 hr tablet Take 1 tablet (5 mg total) by mouth daily with breakfast.  . Insulin Glargine (LANTUS SOLOSTAR) 100 UNIT/ML Solostar Pen INJECT 10 UNITS INTO THE SKIN AT BEDTIME. (Patient taking differently: 15 Units. INJECT 10 UNITS INTO THE SKIN AT BEDTIME.)  . losartan (COZAAR) 50 MG tablet TAKE 1 TABLET (50 MG TOTAL) BY MOUTH DAILY.  . metFORMIN (GLUCOPHAGE) 500 MG tablet TAKE 2 TABLETS TWICE A DAY  . pantoprazole (PROTONIX) 40 MG tablet Take 1 tablet (40 mg total) by mouth daily.  .  saxagliptin HCl (ONGLYZA) 5 MG TABS tablet Take 1 tablet (5 mg total) by mouth daily.  . sertraline (ZOLOFT) 50 MG tablet TAKE 1 TABLET (50 MG TOTAL) BY MOUTH AT BEDTIME.  . Marland KitchenLPRAZolam (XANAX) 0.5 MG tablet Take 1 tablet (0.5 mg total) by mouth at bedtime as needed for anxiety.   No facility-administered encounter medications on file as of 08/22/2015.    Allergies  Allergen Reactions  . Dilaudid [Hydromorphone  Hcl] Other (See Comments)    hallucinations  . Fentanyl Other (See Comments)    hallucinations    Current Medications, Allergies, Past Medical History, Past Surgical History, Family History, and Social History were reviewed in [employer] record.    Review of Systems        See HPI - all other systems neg except as noted...  The patient complains of cyst on back & dyspnea on exertion.  The patient denies anorexia, fever, weight loss, weight gain, vision loss, decreased hearing, hoarseness, chest pain, syncope, peripheral edema, prolonged cough, headaches, hemoptysis, abdominal pain, melena, hematochezia, severe indigestion/heartburn, hematuria, incontinence, muscle weakness, suspicious skin lesions, transient blindness, difficulty walking, depression, unusual weight change, abnormal bleeding, enlarged lymph nodes, and angioedema.     Objective:   Physical Exam     WD, WN, 75 y/o WM in NAD...  GENERAL:  Alert & oriented; pleasant & cooperative... HEENT:  [place]/AT, EOM-wnl, PERRLA, EACs-clear, TMs-wnl, NOSE-clear, THROAT-clear & wnl. NECK:  Supple w/ fairROM; no JVD; s/p left CAE, +bruits; no thyromegaly or nodules palpated; no lymphadenopathy. CHEST:  Clear to P & A; without wheezes/ rales/ or rhonchi heard... HEART:  Regular Rhythm; gr 1/6 SEM without rubs or gallops detected... ABDOMEN:  Soft & nontender; normal bowel sounds; no organomegaly or masses palpated... EXT: right knee deformity s/p ORIF, mod arthritic changes; no varicose veins/ +venous insuffic/  1+ edema. NEURO:  CN's intact; no focal neuro deficits... DERM:  Quarter sized sebaceous cyst in mid back, sl red, min tender...  RADIOLOGY DATA:  Reviewed in the EPIC EMR & discussed w/ the patient...  LABORATORY DATA:  Reviewed in the EPIC EMR & discussed w/ the patient...   Assessment & Plan:    BP>  Neuro has wanted his BP to be sl higher than usual for perfusion reasons but BP= 170/80 on arrival & improved to 150/80 w/ rest; we opted to start Losartan 78m/d for BP & vasc dis... 5/16> BP= 144/76 today, continue Losar50... 9/16> BP= 120/60 & he remains asymptomatic... ~  Subsequent ROVs w/ stable BP on Losartan50 + diet, wt reduction...  CEREBROVASC Disease & STROKES 11/15 & TIA 12/12>  Known severe cerebrovasc dis & he had TIA while in hosp for fx leg 2012 (he was off plavix at the time); Recurrent prob 11/15 w/ watershed infarcts in left frontal/ temporal/ parietal regions while he was off Plavix (he was taking meds on his own & hadn't been filling the Plavix); s/p extensive eval w/ MRI/MRA, CT Angio & Arteriogram by DLadene Artistw/ attempt at intervention on left unsuccessful; wife now supervises all meds... He has prob multi-infarct dementia & wife requests Neuro 2nd opinion => [facility]...  NEURO> Mild Cognitive Impairment per DrSethi & wife is concerned re: memory/ vascular dementia; we will arrange for Neuro 2nd opinion=> started on Aricept & stable...  CHOL>  on Atrova80 now & f/u FLP much improved...  DM>  On Lantus15, Metform, Diabeta, Onglyza & compliance is better w/ wife supervision; Labs stable w/ A1c in the 6's...  Overweight>  We discussed low carb, low fat wt reducing diet;  Nice job w/ wt down to 173# 5/17 OV...  GI>  GERD prev controlled w/ Protonix but he stopped on his own (?why?); EGD 11/13 w/ dilatation & reminded to take the Protonix40 every day...  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  DJD/  Hx Gout/ LBP>  He  was seeing chiropractor regularly regarding LBP & then f/u w/ DrElsner,Neurosurg & had ESI by DrHarkins & the shots have helped...  s/p fall w/ right Tibial plateaux fx> 1st ext fixation, 2nd ORIF>  By DrHardy, Ortho; exercise is difficult but very much needed; he's had extensive PT training...  Anxiety>  He remains on Alpraz Prn... Also has [place] on his list of meds...  B12 deficiency>  Labs 5/16 w/ B12 level = 196; rec to start oral B12 supplement ~1053mg/d & repeat B12 level 11/16= 426   Patient's Medications  New Prescriptions   ALPRAZOLAM (XANAX) 0.5 MG TABLET    Take 1 tablet (0.5 mg total) by mouth at bedtime as needed for anxiety.  Previous Medications   ALPRAZOLAM (XANAX) 0.5 MG TABLET    Take 0.5-1 tablets (0.25-0.5 mg total) by mouth 3 (three) times daily as needed.   ASPIRIN EC 81 MG EC TABLET    Take 1 tablet (81 mg total) by mouth daily.   ATORVASTATIN (LIPITOR) 80 MG TABLET    TAKE 1 TABLET BY MOUTH EVERY DAY AT 6PM   CLOPIDOGREL (PLAVIX) 75 MG TABLET    TAKE 1 TABLET BY MOUTH EVERY DAY   DONEPEZIL (ARICEPT) 10 MG TABLET    Take 1 tablet (10 mg total) by mouth daily.   GLIPIZIDE (GLUCOTROL XL) 5 MG 24 HR TABLET    Take 1 tablet (5 mg total) by mouth daily with breakfast.   INSULIN GLARGINE (LANTUS SOLOSTAR) 100 UNIT/ML SOLOSTAR PEN    INJECT 10 UNITS INTO THE SKIN AT BEDTIME.   LOSARTAN (COZAAR) 50 MG TABLET    TAKE 1 TABLET (50 MG TOTAL) BY MOUTH DAILY.   METFORMIN (GLUCOPHAGE) 500 MG TABLET    TAKE 2 TABLETS TWICE A DAY   PANTOPRAZOLE (PROTONIX) 40 MG TABLET    Take 1 tablet (40 mg total) by mouth daily.   SAXAGLIPTIN HCL (ONGLYZA) 5 MG TABS TABLET    Take 1 tablet (5 mg total) by mouth daily.   SERTRALINE (ZOLOFT) 50 MG TABLET    TAKE 1 TABLET (50 MG TOTAL) BY MOUTH AT BEDTIME.  Modified Medications   No medications on file  Discontinued Medications   No medications on file

## 2015-08-22 NOTE — Patient Instructions (Signed)
Today we updated your med list in our EPIC system...    Continue your current medications the same...  We refilled the alprazolam per request...  Today we rechecked your CXR & lab work...    We will contact you w/ the results when available...   Keep up the good work w/ diet & weight reduction!    Now you NEED to increase you activity level...  Call for any questions...  Let's plan a follow up visit in 45mo, sooner if needed for problems.Marland KitchenMarland Kitchen

## 2015-08-23 NOTE — Progress Notes (Signed)
Quick Note:  Called spoke with pt. Reviewed results and recs. Pt voiced understanding and had no further questions. ______ 

## 2015-08-23 NOTE — Progress Notes (Signed)
Quick Note:  Called spoke with pt. Reivewed results and recs. Pt voiced understanding and had no further questions. ______

## 2015-08-26 ENCOUNTER — Ambulatory Visit: Payer: Medicare Other | Admitting: Pulmonary Disease

## 2015-09-02 ENCOUNTER — Ambulatory Visit: Payer: Medicare Other | Admitting: Pulmonary Disease

## 2015-09-04 ENCOUNTER — Other Ambulatory Visit: Payer: Self-pay | Admitting: Pulmonary Disease

## 2015-09-22 ENCOUNTER — Other Ambulatory Visit: Payer: Self-pay | Admitting: Pulmonary Disease

## 2015-09-26 NOTE — Telephone Encounter (Signed)
Pt requesting refill on alprazolam 0.5mg . Last refill: 08/22/15 #90- 1 tab po qhs prn.  Prior refill had different sig- 0.5-1 tab tid prn #90 with 0 refills.    SN please advise on refill.  Thanks!

## 2015-09-27 NOTE — Telephone Encounter (Signed)
Per SN: call pt/wife please - how is he taking this med???

## 2015-09-30 ENCOUNTER — Telehealth: Payer: Self-pay | Admitting: Pulmonary Disease

## 2015-09-30 NOTE — Telephone Encounter (Signed)
Spoke with pt's daughter, Santiago Glad. Pt is taking 0.5-1 tablet po TID prn.  SN - please advise on refill. Thanks.

## 2015-09-30 NOTE — Telephone Encounter (Signed)
Garrett still waiting on the xanax

## 2015-09-30 NOTE — Telephone Encounter (Signed)
Received fax from [facility] Pt is scheduled for cleaning tomorrow 6/20 and asked if pt needed to stop his Plavix Per SN, okay to stop day prior to procedure and resume day after Called spoke with patient who already took it earlier today Spoke with SN again who reported pt can skip tomorrow and resume day after procedure Pt voiced his understanding Form faxed back to [facility] @ [phone] Sent for scan  Will sign off

## 2015-10-02 NOTE — Telephone Encounter (Signed)
Called [facility] and spoke with Lattie Haw > authorization given for Alprazolam 0.5mg  1/2-1 tab TID prn #90 with 1 add'l refill Med list updated Detailed message for pt's daughter left informing her refill was taken care of  Will sign off

## 2015-10-11 ENCOUNTER — Other Ambulatory Visit: Payer: Self-pay | Admitting: Pulmonary Disease

## 2015-10-24 DIAGNOSIS — I10 Essential (primary) hypertension: Secondary | ICD-10-CM | POA: Diagnosis not present

## 2015-10-24 DIAGNOSIS — R4781 Slurred speech: Secondary | ICD-10-CM | POA: Diagnosis not present

## 2015-10-24 DIAGNOSIS — R402362 Coma scale, best motor response, obeys commands, at arrival to emergency department: Secondary | ICD-10-CM | POA: Diagnosis not present

## 2015-10-24 DIAGNOSIS — I639 Cerebral infarction, unspecified: Secondary | ICD-10-CM | POA: Diagnosis not present

## 2015-10-24 DIAGNOSIS — E785 Hyperlipidemia, unspecified: Secondary | ICD-10-CM | POA: Diagnosis not present

## 2015-10-24 DIAGNOSIS — I6523 Occlusion and stenosis of bilateral carotid arteries: Secondary | ICD-10-CM | POA: Diagnosis not present

## 2015-10-24 DIAGNOSIS — I6501 Occlusion and stenosis of right vertebral artery: Secondary | ICD-10-CM | POA: Diagnosis not present

## 2015-10-24 DIAGNOSIS — Z8673 Personal history of transient ischemic attack (TIA), and cerebral infarction without residual deficits: Secondary | ICD-10-CM | POA: Diagnosis not present

## 2015-10-24 DIAGNOSIS — Z794 Long term (current) use of insulin: Secondary | ICD-10-CM | POA: Diagnosis not present

## 2015-10-24 DIAGNOSIS — I6502 Occlusion and stenosis of left vertebral artery: Secondary | ICD-10-CM | POA: Diagnosis not present

## 2015-10-24 DIAGNOSIS — R4182 Altered mental status, unspecified: Secondary | ICD-10-CM | POA: Diagnosis not present

## 2015-10-24 DIAGNOSIS — Z79899 Other long term (current) drug therapy: Secondary | ICD-10-CM | POA: Diagnosis not present

## 2015-10-24 DIAGNOSIS — Z7902 Long term (current) use of antithrombotics/antiplatelets: Secondary | ICD-10-CM | POA: Diagnosis not present

## 2015-10-24 DIAGNOSIS — I63233 Cerebral infarction due to unspecified occlusion or stenosis of bilateral carotid arteries: Secondary | ICD-10-CM | POA: Diagnosis not present

## 2015-10-24 DIAGNOSIS — Z7984 Long term (current) use of oral hypoglycemic drugs: Secondary | ICD-10-CM | POA: Diagnosis not present

## 2015-10-24 DIAGNOSIS — I129 Hypertensive chronic kidney disease with stage 1 through stage 4 chronic kidney disease, or unspecified chronic kidney disease: Secondary | ICD-10-CM | POA: Diagnosis not present

## 2015-10-24 DIAGNOSIS — R413 Other amnesia: Secondary | ICD-10-CM | POA: Diagnosis not present

## 2015-10-24 DIAGNOSIS — E1122 Type 2 diabetes mellitus with diabetic chronic kidney disease: Secondary | ICD-10-CM | POA: Diagnosis not present

## 2015-10-24 DIAGNOSIS — R402252 Coma scale, best verbal response, oriented, at arrival to emergency department: Secondary | ICD-10-CM | POA: Diagnosis not present

## 2015-10-24 DIAGNOSIS — I342 Nonrheumatic mitral (valve) stenosis: Secondary | ICD-10-CM | POA: Diagnosis not present

## 2015-10-24 DIAGNOSIS — R001 Bradycardia, unspecified: Secondary | ICD-10-CM | POA: Diagnosis not present

## 2015-10-24 DIAGNOSIS — R402142 Coma scale, eyes open, spontaneous, at arrival to emergency department: Secondary | ICD-10-CM | POA: Diagnosis not present

## 2015-10-24 DIAGNOSIS — R2990 Unspecified symptoms and signs involving the nervous system: Secondary | ICD-10-CM | POA: Diagnosis not present

## 2015-10-24 DIAGNOSIS — N183 Chronic kidney disease, stage 3 (moderate): Secondary | ICD-10-CM | POA: Diagnosis not present

## 2015-10-25 DIAGNOSIS — R413 Other amnesia: Secondary | ICD-10-CM | POA: Diagnosis not present

## 2015-10-25 DIAGNOSIS — R4781 Slurred speech: Secondary | ICD-10-CM | POA: Diagnosis not present

## 2015-10-25 DIAGNOSIS — E1122 Type 2 diabetes mellitus with diabetic chronic kidney disease: Secondary | ICD-10-CM | POA: Insufficient documentation

## 2015-10-25 DIAGNOSIS — R2990 Unspecified symptoms and signs involving the nervous system: Secondary | ICD-10-CM | POA: Diagnosis not present

## 2015-10-25 DIAGNOSIS — Z8673 Personal history of transient ischemic attack (TIA), and cerebral infarction without residual deficits: Secondary | ICD-10-CM | POA: Diagnosis not present

## 2015-10-25 DIAGNOSIS — I10 Essential (primary) hypertension: Secondary | ICD-10-CM | POA: Diagnosis not present

## 2015-10-27 DIAGNOSIS — I6381 Other cerebral infarction due to occlusion or stenosis of small artery: Secondary | ICD-10-CM | POA: Insufficient documentation

## 2015-11-04 ENCOUNTER — Inpatient Hospital Stay: Payer: Medicare Other | Admitting: Pulmonary Disease

## 2015-11-06 ENCOUNTER — Other Ambulatory Visit: Payer: Self-pay | Admitting: Pulmonary Disease

## 2015-11-13 ENCOUNTER — Ambulatory Visit (INDEPENDENT_AMBULATORY_CARE_PROVIDER_SITE_OTHER): Payer: Medicare Other | Admitting: Pulmonary Disease

## 2015-11-13 ENCOUNTER — Encounter: Payer: Self-pay | Admitting: Pulmonary Disease

## 2015-11-13 VITALS — BP 126/62 | HR 83 | Temp 97.7°F | Ht 66.0 in | Wt 169.4 lb

## 2015-11-13 DIAGNOSIS — I679 Cerebrovascular disease, unspecified: Secondary | ICD-10-CM

## 2015-11-13 DIAGNOSIS — I63232 Cerebral infarction due to unspecified occlusion or stenosis of left carotid arteries: Secondary | ICD-10-CM | POA: Diagnosis not present

## 2015-11-13 DIAGNOSIS — R413 Other amnesia: Secondary | ICD-10-CM

## 2015-11-13 DIAGNOSIS — I6523 Occlusion and stenosis of bilateral carotid arteries: Secondary | ICD-10-CM

## 2015-11-13 NOTE — Patient Instructions (Addendum)
Today we updated your med list in our EPIC system...    Continue your current medications the same...  Continue your Aspirin/ Plavix regimen for stroke prevention...  We will arrange for a follow up appt w/ your Neurologist, DrAquino...  Call for any questions...  You already have a follow up appt w/ me in about 21mo (Nov 2017)...

## 2015-11-13 NOTE — Progress Notes (Signed)
Subjective:    Patient ID: Peter Nicholson, male    DOB: 02/22/1941, 75 y.o.   MRN: [id number]  HPI 75 y/o WM here for a follow up visit... he has multiple medical problems as noted below...  ~  SEE PREV EPIC NOTES FOR OLDER DATA >>     Hx OSA but he's been off CPAP x yrs & denies sleep disordered breathing, denies snoring, feels he rests well, & wakes refreshed w/o daytime hypersomnolence but he takes a nap every day...  known severe extracranial cerebrovasc dis w/ [facility] 2000 [facility] & known severe bilat cavernous ICA stenoses; he is also s/p vertebral art angioplasty x2 by DrTDeveshwar w/ known basilar art occlusion  he has severe intracranial atherosclerotic dis and chr cerebellar infarcts on prev scans; memory problems felt to be mild cognitive impairment by DrSethi on eval 3/14 (but he is clearly high risk for vascular dementia problems)...   LABS 10/14 in Epic> REVIEWED.Marland KitchenMarland Kitchen   LABS 11/14:  Chems- OK x BS=182, A1c=8.3, BUN=29, Cr=1.2.Marland KitchenMarland Kitchen   LABS 8/15:  Chems- ok w/ BS=167, A1c=7.3, Cr=1.2.Marland Kitchen.  ~  Adm 11/15- 02/28/14 by [facility] when he presented w/ dysarthria; hx severe vasc dis w/ known severe extracranial cerebrovasc dis w/ left CAE 2000 by DrLawson & known severe bilat cavernous ICA stenoses; he is also s/p vertebral art angioplasty x2 by DrTDeveshwar w/ known basilar art occlusion; he was taking meds on his own & not monitored by family- he had stopped his Plavix (confused, didn't remember), ?DM meds & Simva; Hosp eval included CT Head & MRI/MRA Brain showing old cerebellar & thalamic infarcts and small vessel dis, acute sm nonhemorrhaghic infarcts throughout the left frontal/ temporal/ & parietal areas; Left ICA is occluded (reconstitution of flow in supraclinoid region appears inadeq), mod stenosis of Right ICA cavernous segment; occluded right vertebral and right PICA; high grade stenosis of Left vertebral art & mod narrowing of prox basilar art- see full report!  Subseq CT Angio Head  & Neck showed prev Left CAE markedly attenuated & occluded at skullbase/ petrous segment of the left ICA, diminutive right vertebral that occludes at the skullbase, dominant left vertebral w/ hi-grade stenosis distally, diffuse atherosclerotic changes... 2DEcho was neg for embolic source... He was placed back on ASA/Plavix & no intervention recommended, reminded about need for strict regulation of BP, Chol, DM... Speech improved in [place] for outpt f/u w/ DrSethi (he has not returned)... ~  Adm 12/27- 04/13/14 by [facility] w/ confusion, ataxia, & speech difficulty w/ BS found to be 666 (this is when it was discovered that he was taking his own meds without supervision & several meds hadn't been filled in months); he had an Enterococcus UTI- treated w/ Augmentin; repeat MRI/MRA Brain w/o change from Nov; he was re-evaluated by [facility] for IR- cerebral angiography performed and attempted revasc of the left ICA was attempted but unsuccessful, he was disch home on same meds (ASA/Plavix) w/ supervision of all meds from wife; Lisinopril was held & they rec keeping BP in the 130-160 range...    ~  May 25, 2014:  73moROV & here for DM recheck> on Lantus15, Metform500-2Bid, Diabeta5Qam, Onglyza5Qpm; all meds supervised by wife now; they state accuchecks are up & down (55-95 on the low, and 190-350 on the high);  Labs today showed BS=96, A1c improved to 7.6 => we decided to continue same meds & diet, ROV 3 months...  In addition his BP is sl elev 170/80 by nurse and  150/80 by me; wife states that Neuro wanted his BP sl higher due to severe vasc dis & perfusion concerns;  We decided to add LOSARTAN50 for his BP & vasc dis;  Continue other meds the same...   ~  Aug 23, 2014:  46moROV & [name] reports doing well, no complaints or concerns;  Wife is very concerned regarding his memory & behavior changes and she is sched forback surg soon; requesting Neuro 2nd opinion for help w/ these problems... We reviewed the  following medical problems during today's office visit >>     HBP> we added Losar50 last OV; BP= 144/76 & reminded to avoid sodium & get weight down; he is essentially asymptomatic w/o CP, palpit, SOB, edema, etc...    Cerebrovasc dis> on his ASA81 & Plavix75 (he had stopped on his own- now supervised by wife); [facility] known severe bilat cavernous ICA stenoses (occluded left); he is also s/p vertebral art angioplasty x2 by DAzell Derw/ known basilar art occlusion & severe intracranial atherosclerotic dis; Scans 11/15 revealed acute watershed infarcts in left frontal/ temporal/ parietal lobes & old infarcts in cerebellum & thalamus; DrTDeveshwar attempted intervention to left carotid system but this was reported as unsuccessful (unsucceeful attempt at revascularization of occluded LICA cavernous segment w/ multiple collaterals noted)...    Chol> on Lip80 now + diet; FLP 5/16 showed TChol 113, TG 51, HDL 48, LDL 54 => much improved, continue the same.    DM> on Lantus15u now, Metform500-2Bid, Diabeta5AM, Onglyza5PM; compliance w/ meds was poor but wife supervises now; Labs 5/16 shows BS=130, A1c=6.8 => much improved, continue same.    GU- Hx horseshoe kidney, renal cell cancer, nephrolithiasis> right side of horseshoe kid removed 1994 (RCCa); followed by Urology for yrs & no recurrence; Creat in the 1.1-1.5 range; PSA in the 1-3 range...    Neuro- memory impairment> on ASA81 & Plavix75, plus MVI etc; he has severe intracranial atherosclerotic dis, chr cerebellar infarcts on prev scans, & new watershed infarcts on left 11/15; memory problems felt to be mild cognitive impairment by DrSethi on eval 3/14, vascular dementia in light of numerous sm infarcts on scans; wife wants 2nd opinion Neuro & we will refer...    Anxiety/Depression> on Xanax0.5 prn, Pamelor25,  & Zoloft50...     B12 deficiency> Labs 5/16 showed B12 level = 196 & rec to add  10019m VitB12 OTC to his MVI daily... We reviewed prob list, meds, xrays and labs> see below for updates >>   LABS 5/16:  FLP- at goals on Atorva80;  Chems- ok w/ BS=130, A1c=6.8, Cr=1.27;  CBC- ok w/ Hg=12.4;  B12 level= 196...  PLAN>> BP & DM control much improved on current meds w/ wife supervising his meds Rx; FLP looks good on Atorva80; severe vasc dis w/ mult interventions and prob multi-infarct dementia- we will refer to Neuro for 2nd opinion per request; Note Vit B12 is low & we will add 100028mB12 to his vitamin regimen...  ~  December 31, 2014:  64mo66mo & add-on appt requested for "congestion"; pt had some difficulty remembering why he was here, then noted 2d hx sinus congestion, drainage, blowing sl beige mucus, assoc w/ cough prod of a sm amt of similar beige sput; denies f/c/s, CP, SOB...     BP is better regulated w/ him on the Losar50; BP= 120/60 today & he denies CP, palpit, SOB, edema, etc...    After his  last visit he went to see [facility] for [facility] 6/16 noting memory loss, MOCA=20/30, hx of severe vasc dis etc; they started Aricept5=>10, MVI, and B12 supplement; also rec physical exercise & brain stimulation thru puzzles etc.    He also had a f/u w/ DrDeveshwar from IR 7/16> MRI/MRA of the Brain revealed chronic ischemicchanges stable from his prev scans, neg for acute infarcts; chr occlusion of left ICA, mod-severe stenosis of the right cavernous carotid, occlusion of the distal right vertebral art, mod-severe stenosisof the distal left vertebral as well; he is continuing to follow the pt... EXAM showed Afeb, VSS w/ BP=120/60;  HEENT- neg, mallampati2;  Chest- clear w/o w/r/r;  Heart- RR, w/o m/r/g;  Abd- soft, neg;  Ext- neg w/o c/c/e... IMP/PLAN>>  Peter Nicholson appears to have a mild URI w/ some sinus congestion & drainage;  He is a diabetic so we will try to avoid Pred and treat w/ ZPAK, [place], DELSYM, OTC antihist prn as well;  They request TRAMADOL 80m for pain- ok... He has a  regular ROV sched for Nov.   ~  February 25, 2015:  226moOV & medical recheck> Prev congestion from 9/16 OV has resolved w/ Zpak, Mucinex, Delsym; currently reports doing satis but wife reports BS up&down 5-200; he is too sedentary- not exercising at all, just watching TV, wife says he won't read/ work puzzles/ do anything substantive regarding physical or mental exercises... We reviewed the following medical problems during today's office visit >>     OSA> he's been off CPAP x yrs & denies sleep disordered breathing, denies snoring, feels he rests well, & wakes refreshed w/o daytime hypersomnolence but he takes a nap every day...    HBP> on Losartan50; BP= 126/68 & reminded to avoid sodium & get weight down; he is essentially asymptomatic w/o CP, palpit, SOB, edema, etc...    Cerebrovasc dis> on his ASA81 & Plavix75 (meds supervised by wife); known severe [facility]/ [facility] known severe bilat cavernous ICA stenoses (occluded left); he is also s/p vertebral art angioplasty x2 by DrAzell Der/ known basilar art occlusion & severe intracranial atherosclerotic dis; Scans 11/15 revealed acute watershed infarcts in left frontal/ temporal/ parietal lobes & old infarcts in cerebellum & thalamus; DrTDeveshwar attempted intervention to left carotid system but this was reported as unsuccessful (unsucceeful attempt at revascularization of occluded LICA cavernous segment w/ multiple collaterals noted)...    Chol> on Lip80 now + diet; FLP 5/16 showed TChol 113, TG 51, HDL 48, LDL 54 => much improved, continue the same.    DM> on Lantus15u, Metform500-2Bid, Diabeta5AM, Onglyza5PM; compliance w/ meds was poor but wife supervises now; Labs 5/16 shows BS=130, A1c=6.8 => Labs 11/16 showed BS=226, A1c=6.7, continue same meds, better diet.     GU- Hx horseshoe kidney, renal cell cancer, nephrolithiasis> right side of horseshoe kid removed 1994 (RCCa); followed by Urology for yrs & no  recurrence; Creat in the 1.1-1.5 range; PSA in the 1-3 range...    Neuro- memory impairment> on ASA81 & Plavix75, plus MVI etc; he has severe intracranial atherosclerotic dis, chr cerebellar infarcts on prev scans, & new watershed infarcts on left 11/15; memory problems felt to be mild cognitive impairment by DrSethi on eval 3/14, vascular dementia in light of numerous sm infarcts on scans; wife wants 2nd opinion Neuro & [facility] noting memory loss, MOCA=20/30, hx of severe vasc dis etc; they started Aricept5=>10, MVI, and B12 supplement;  also rec physical exercise & brain stimulation thru puzzles etc.    Anxiety/Depression> on Xanax0.5 prn & Zoloft50...     B12 deficiency> Labs 5/16 showed B12 level = 196 & rec to add 1025mg VitB12 OTC to his MVI daily; Labs 11/16 showed B12=426... EXAM shows Afeb, VSS w/ BP=126/68 & O2sat=98% on RA;  HEENT- neg, mallampati2;  Chest- clear w/o w/r/r;  Heart- RR, w/o m/r/g;  Abd- soft, neg;  Ext- neg w/o c/c/e...  LABS 02/25/15 showed BS=226, A1c=6.7, K=5.5, Cr=1.15, B12=426... IMP/PLAN>>  Problems as listed- control of BP, BS is reasonable & could be better w/ diet/ exercise/ wt reduction; reviewed w/ pt & wife, given 2016 Flu vaccine today.  ~  March 11, 2015:  2wk add-on appt requested to check sebaceous cyst on his back> quarter sized, sl red, min tender in mid-back; showed up over the past 1-2 wks he says, no drainage, denies f/c/s, never had prev cysts... EXAM shows Afeb, VSS, O2sat=99% on RA;  Sebaceous cyst in mid back- sebum expressed, not foul, no adenopathy, etc... REC>>  Clean w/ mild soapy water Bid, cover w/ neosporin/ dry gauze/ & paper tape, Rx Keflex 500 Tis x7d and call if not responding for referral to general surg for drainage...  ~  Aug 22, 2015:  611moOV & Peter Nicholson indicates that he is doing well, no new complaints or concerns- and his wife confirms; due for f/u CXR & full labs today but he is not fasting,  therefore we will elim the FLP & check everything else; they request refill of Alpraz0.5=> done... We reviewed the following medical problems during today's office visit >>     OSA> he's been off CPAP x yrs & denies sleep disordered breathing, denies snoring, feels he rests well, & wakes refreshed w/o daytime hypersomnolence but he takes a nap every day...    HBP> on Losartan50; BP= 132/60 & reminded to avoid sodium & keep weight down; he is essentially asymptomatic w/o CP, palpit, SOB, edema, etc...    Cerebrovasc dis> on his ASA81 & Plavix75 (meds supervised by wife); known severe [facility]/ [facility] known severe bilat cavernous ICA stenoses (occluded left); he is also s/p vertebral art angioplasty x2 by DrAzell Der/ known basilar art occlusion & severe intracranial atherosclerotic dis; Scans 11/15 revealed acute watershed infarcts in left frontal/ temporal/ parietal lobes & old infarcts in cerebellum & thalamus; DrTDeveshwar attempted intervention to left carotid system but this was reported as unsuccessful (unsucceeful attempt at revascularization of occluded LICA cavernous segment w/ multiple collaterals noted);  Last saw DrTDeveshwar 10/2014- note reviewed & 7/16 MRI/MRA & catheter angiogram reviewed=> tight focal stenosis of the right internal carotid artery distal cavernous segment, and the left vertebrobasilar junction suggestive of interval worsening. However no parenchymal changes suggestive of intervening or acute ischemic changes were noted on the DWI images- therefore they opted for continued medical management.    Chol> on Lip80 now + diet; last FLP 5/16 showed TChol 113, TG 51, HDL 48, LDL 54 => much improved, continue the same; reminded to come fasting for f/u FLP.    DM> wt is down 11# to 173# today; on Lantus15u now, Metform500-2Bid, Diabeta5AM, Onglyza5PM; compliance w/ meds was poor but wife supervises now; Labs 5/16 shows BS=130, A1c=6.8 => Labs  11/16 showed BS=226, A1c=6.7 => Labs 5/17 showed BS=142 & A1c wasn't done; Rec to continue same meds, good job w/ wt loss...    GU- Hx horseshoe kidney,  renal cell cancer, nephrolithiasis> right side of horseshoe kid removed 1994 (RCCa); followed by Urology for yrs & no recurrence; Creat in the 1.1-1.5 range; PSA in the 1-3 range; Labs 5/17 showed BUN=53, Cr=1.43, PSA=1.01; Rec to incr water intake...    Neuro- memory impairment> on ASA81 & Plavix75, plus MVI etc; he has severe intracranial atherosclerotic dis, chr cerebellar infarcts on prev scans, & new watershed infarcts on left 11/15; memory problems felt to be mild cognitive impairment by DrSethi on eval 3/14, vascular dementia in light of numerous sm infarcts on scans; wife wants 2nd opinion Neuro & we referred to [facility] 6/16 noting memory loss, MOCA=20/30, hx of severe vasc dis etc; they started Aricept5=>10, MVI, and B12 supplement; also rec physical exercise & brain stimulation thru puzzles etc; Last f/u DrAquino 08/02/15- reviewed, tolerating Aricept, stable & independent w/ ADLs, drives a little, DHRC=16/38, no ch in meds.     Anxiety/Depression> on Xanax0.5 prn & Zoloft50...     B12 deficiency> Labs 5/16 showed B12 level = 196 & rec to add 1062mg VitB12 OTC to his MVI daily; Labs 11/16 showed B12=426...    Mild Anemia>  Labs 5/17 showed Hg=11.1, MCV=94;  Rec to add 1-a-day w/ Fe to his B12 supplement EXAM shows Afeb, VSS w/ BP=126/68 & O2sat=98% on RA;  HEENT- neg, mallampati2;  Chest- clear w/o w/r/r;  Heart- RR, w/o m/r/g;  Abd- soft, neg;  Ext- neg w/o c/c/e...  CXR 08/22/15 showed norm heart size, atherosclerotic Ao, clear lungs, NAD; arthritis in Tspine...   LABS 5/17>  FLP- not done;  Chems- ok x BUN=53, Cr=1.43, BS=142 (rec to incr free water + low carb diet);  CBC- ok x Hg=11.1, MCV=94 (rec to add 1-a-day w/ Fe to his B12 supplement daily);  TSH=2.50;  PSA=1.01... IMP/PLAN>>  JDemariusis mostly stable- he's followed by Neuro & IR for  his cerebrovasc dis & MCI on Aricept;  Medically improved w/ wt reduction- good job!  Rec to continue same meds regularly, diet, exercise, drink more free water, & add 1-a-ady w/ Fe to his B12 vit daily...  ~  November 13, 2015:  358moOV &post hosp check>  [facility] in [facility] Everywhere> presented w/ unsteady gait, slurred speech & word finding problems; eval revealed an acute left brain stroke (MRI showed lacunar infarct in left caudate nucleus) & chronically occluded left ICA w/ stenosis in right ICA and left vertebral art => they rec [facility] adjusted on DiTempletonome (see below);  Since disch they note slurring has resolved, unsteadiness about the same & no change over the last several months-- we discussed referral & f/u by NEURO, DrAquino (to consider poss Neuro-rehab) but they decline f/u appt w/ DrTDeveshwar at this time...  we reviewed the following medical problems during today's office visit >>     OSA> he's been off CPAP x yrs & denies sleep disordered breathing, denies snoring, feels he rests well, & wakes refreshed w/o daytime hypersomnolence but he takes a nap every day...    HBP> on Losartan50 + diet; BP= 126/62 & reminded to avoid sodium & keep weight down; he is essentially asymptomatic w/o CP, palpit, SOB, edema, etc...    Severe cerebrovasc dis> on his ASA81 & Plavix75 (meds supervised by wife); known severe [facility]/ [facility] known severe bilat cavernous ICA stenoses (occluded left); he is also  s/p vertebral art angioplasty x2 by DrTDeveshwar w/ known basilar art occlusion & severe intracranial atherosclerotic dis; Scans 11/15 revealed acute watershed infarcts in left frontal/ temporal/ parietal lobes & old infarcts in cerebellum & thalamus; DrTDeveshwar attempted intervention to left carotid system but this was reported as unsuccessful (unsucceeful attempt at revascularization  of occluded LICA cavernous segment w/ multiple collaterals noted);  Last saw DrTDeveshwar 10/2014- note reviewed & 7/16 MRI/MRA & catheter angiogram reviewed=> tight focal stenosis of the right internal carotid artery distal cavernous segment, and the left vertebrobasilar junction suggestive of interval worsening- they opted for continued medical management;  ADM to [facility] 10/2015 w/ acute lacunar infarct in left caudate nucleus- DC on same ASA/PLAVIX to f/u w/ Neuro & IR...    Chol> on Lip80 + diet; last FLP 5/16 showed TChol 113, TG 51, HDL 48, LDL 54 => much improved, continue the same; reminded to come fasting for f/u FLP.    DM> wt is down 3# to 170# today; on Lantus15u now, Metform500-2Bid, Glucotrol5AM, Onglyza5PM; compliance w/ meds was poor but wife supervises now; Labs 5/16 shows BS=130, A1c=6.8 => Labs 11/16 showed BS=226, A1c=6.7 => Labs 5/17 showed BS=142 & A1c wasn't done; Rec to continue same meds, good job w/ wt loss...    GU- Hx horseshoe kidney, renal cell cancer, RI, nephrolithiasis> right side of horseshoe kid removed 1994 (RCCa); followed by Urology for yrs & no recurrence; Creat in the 1.1-1.5 range; PSA in the 1-3 range; Labs 5/17 showed BUN=53, Cr=1.43, PSA=1.01; Rec to incr water intake...    Neuro- memory impairment> on ASA81 & Plavix75, plus MVI etc; he has severe intracranial atherosclerotic dis, chr cerebellar infarcts on prev scans, & new watershed infarcts on left 11/15; memory problems felt to be mild cognitive impairment by DrSethi on eval 3/14, vascular dementia in light of numerous sm infarcts on scans; wife wants 2nd opinion Neuro & we referred to [facility] 6/16 noting memory loss, MOCA=20/30, hx of severe vasc dis etc; they started Aricept5=>10, MVI, and B12 supplement; also rec physical exercise & brain stimulation thru puzzles etc; Last f/u DrAquino 08/02/15- reviewed, tolerating Aricept, stable & independent w/ ADLs, drives a little, HTDS=28/76, no ch in meds.      Anxiety/Depression> on Xanax0.5 prn & Zoloft50...     B12 deficiency> Labs 5/16 showed B12 level = 196 & rec to add 1045mg VitB12 OTC to his MVI daily; Labs 11/16 showed B12=426...    Mild Anemia>  Labs 5/17 showed Hg=11.1, MCV=94;  Rec to add 1-a-day w/ Fe to his B12 supplement EXAM shows Afeb, VSS w/ BP=126/62 & O2sat=100% on RA;  HEENT- neg, mallampati2;  Chest- clear w/o w/r/r;  Heart- RR, w/o m/r/g;  Abd- soft, neg;  Ext- neg w/o c/c/e; Neuro- sl slow mentation, no focal deficits...  CT Head [facility] 10/22/15>  No acute changes-- low attenuation in left frontal & left parietal lobes c/w old infarcts, old right cerebellar infarct...  MRI Brain [facility] 10/25/15>  small acute lacunar infarct in the left caudate nucleus; no associated hemorrhage or mass effect; underlying severe chronic ischemia and circle of Willis vasculopathy (including chronic left ICA occlusion) as described on the comparison MRI/MRA from 04/2015...  EKG [facility] 10/26/15>  NSR, rate60, wnl, NAD...  2DEcho [facility] 10/25/15>  normal left ventricle size and function, with no regional wall motion abn, EOT=15-72% normal diastolic function, no evidence of elevated left atrial pressure, no evidence of systolic or diastolic heart failure, no cardiac source of emboli detected, mild  aortic valve sclerosis, good mobility, trace regurgitation and no evidence of stenosis.  LABS [facility] 10/2015>  Chems- ok x BS=68-199, BUN=37, Cr=1.46;  CBC- Hg=11.1.Marland KitchenMarland Kitchen  IMP/PLAN>>  Jonavon is back to his baseline which includes severe cerebrovasc dis- s/p mult interventions by DrTDeveshwar & carotid surg [facility] yrs ago [facility]; he remains on ASA/ Plavix & we will refer to Neuro-DrAquino for her review; pt does not want [place] to set up a follow up appt w/ drDeveshwar at this time... We plan recheck 41mo..          Problem List:     Hearing Loss >> [facility] sensor[facility] hearing [facility] & rec to consider hearing aides...   OBSTRUCTIVE SLEEP APNEA (ICD-327.23) -  s/p split night sleep study 11/08- showing an AHI of 19, assoc w/ an O2 sat down to 78%... during the CPAP trial he titrated up to a pressure of 11cm but never ret to REM sleep so optimal pressure is still ???...he uses [facility] for his CPAP needs... ~  12/10: he tells me that he has stopped the CPAP, that he sleeps better without it, not snoring/ rests well, & wakes feeling refreshed... he refuses Sleep Med re-eval etc... ~  11/14: he's been off CPAP x yrs & denies sleep disordered breathing, denies snoring, feels he rests well, & wakes refreshed w/o daytime hypersomnolence, he naps daily... ~  2015> he reports stable & denies sleep disordered breathing...  Hx of HYPERTENSION >>   ~  not currently on medications =>  ~  6/12: BP= 136/80 off meds & denies HA, visual changes, CP, palipit, dizziness, syncope, dyspnea, edema, etc... he states that home BP checks are "OK". ~  CXR 11/12 showed borderline heart size, tort Ao, clear lungs w/ ?underlying COPD, NAD...  ~  [place]13[place] both stopped 1/13 by TP w/ renal insuffic & elev K+; subseq improved & BP stable... ~  2/13: BP= 112/68 & feeling weak etc...  3/13:  BP= 140/72 & feeling some better... ~  6/13:  BP= 138/78 & feeling much better; denies CP, palpit, ch in SOB or edema... ~  10/13:  BP= 140/82 & he is c/o reflux & dysphagia today, no CP/ palpit/ SOB/ edema... ~  11/14: not currently on meds, just diet controlled; BP= 140/86 & reminded to avoid sodium & get weight down; he is essentially asymptomatic w/o CP, palpit, SOB, edema, etc. ~  2/15: still not on meds, just diet controlled; BP= 124/76 & he remains asymptomatic...  ~  8/15: not currently on meds, just diet controlled; BP= 142/84 & reminded to avoid sodium & get weight down; he is essentially asymptomatic w/o CP, palpit, SOB, edema, etc ~  1/16: still not on meds, just diet controlled; BP= 132/80 & Neuro has rec BP range of  130-160 to aid perfusion due to his severe cerebrovasc dis... ~  2/16: BP 170/80 => recheck 150/80 range; we decided to add Losartan 567md for BP & vasc dis... ~  5/16: on Losartan50; BP= 144/76 & he says he's asymptomatic, continue same... ~  11/16: on Losartan50; BP= 126/68 & reminded to avoid sodium & get weight down; he is essentially asymptomatic w/o CP, palpit, SOB, edema, etc... ~  5/17: on Losartan50; BP= 132/60 & reminded to avoid sodium & keep weight down; he remains largely asymptomatic.  CEREBROVASCULAR DISEASE (ICD-437.9) - stable on ASA 32517m & PLAVIX  1m/d per DrDeveshwar/ DrLawson/ DrReynolds... he has severe extracranial cerebrovasc disease- esp in the post circulation, and he is s/p vertebral art angioplasty x 2, and w/ a known basilar art occlusion- prev on Coumadin controlled by Neurology (switched to ASA/ Plavix in 2010)... also followed by DSheryn Bisonand DrDeveshwar for IR... [facility] ~  CDoppler 2/07 showed 0-39% bilat ICA stenoses, s/p left CAE w/ DPA... ~  eval 3/10 by DSheryn Bisonw/ CDopplers that showed similar patency w/ 0-39% bilat ICA stenoses; due to the 10 yr interval DrLawson did not feel he needed any additional follow up. ~  12/10: continued f/u DrDevershwar IR w/ MRI/ MRAs showing bilat carotid siphon region atherosclerosis & narrowing (unchanged), and markedly diseased distal left vertebral & prox basilar art narrowing (no change)... ~  11/12: CTA head & neck showed similar severe dis w/ mod to severe bilat cavernous ICA stenoses due to extensive atherosclerosis; severe distal vertebral atherosclerosis- distal right occluded, distal left w/ high grade stenosis; mod to severe bilat PCA stenoses; chr cerebellar infarcts, no acute infarcts. ~  11/14: on ASA81 & Plavix75; known severe extracranial cerebrovasc dis w/ left CAE 2000 by DrLawson & known severe bilat cavernous ICA stenoses; he is also s/p vertebral art angioplasty x2 by  DrTDeveshwar w/ known basilar art occlusion ~  8/15: on ASA81 & Plavix75; known severe extracranial cerebrovasc dis w/ left CAE 2000 by DrLawson & known severe bilat cavernous ICA stenoses; he is also s/p vertebral art angioplasty x2 by DrTDeveshwar w/ known basilar art occlusion, he endorses medication compliance & denies cerebral ischemic symptoms... ~  11/15: Hosp by [facility] when he presented w/ dysarthria; hx severe vasc dis w/ known severe [facility]ranial [facility]asc [facility] known severe bilat cavernous ICA stenoses; he is also s/p vertebral art angioplasty x2 by DrTDeveshwar w/ known basilar art occlusion; he was taking meds on his own & not monitored by family- he had stopped his Plavix (confused, didn't remember), ?DM meds & Simva; Hosp eval included CT Head & MRI/MRA Brain showing old cerebellar & thalamic infarcts and small vessel dis, acute sm nonhemorrhaghic infarcts throughout the left frontal/ temporal/ & parietal areas; Left ICA is occluded (reconstitution of flow in supraclinoid region appears inadeq), mod stenosis of Right ICA cavernous segment; occluded right vertebral and right PICA; high grade stenosis of Left vertebral art & mod narrowing of prox basilar art- see full report!  Subseq CT Angio Head & Neck showed prev Left CAE markedly attenuated & occluded at skullbase/ petrous segment of the left ICA, diminutive right vertebral that occludes at the skullbase, dominant left vertebral w/ hi-grade stenosis distally, diffuse atherosclerotic changes... 2DEcho was neg for embolic source... He was placed back on ASA/Plavix & no intervention recommended, reminded about need for strict regulation of BP, Chol, DM... [place] DrSethi (he did not return)... ~  12/15: Hosp by [facility] w/ confusion, ataxia, & speech difficulty w/ BS found to be 666 (this is when it was discovered that he was taking his own meds without supervision & several meds  hadn't been filled in months); he had an Enterococcus UTI- treated w/ Augmentin; repeat MRI/MRA Brain w/o change from Nov; [facility] performed and attempted revasc of the left ICA was attempted but unsuccessful, he was disch home on same meds (ASA/Plavix) w/ supervision of all meds from wife; Lisinopril was  held & they rec keeping BP in the 130-160 range...  ~  5/16: he has severe vascular dis & wife is concerned about memory loss & behavioral changes- continue ASA/ Plavix & she requests Neuro 2nd opinion... ~  11/16: on his ASA81 & Plavix75 (meds supervised by wife); known severe extracranial cerebrovasc dis w/ left CAE 2000 by [facility] & known severe bilat cavernous ICA stenoses (occluded left); he is also s/p vertebral art angioplasty x2 by Azell Der w/ known basilar art occlusion & severe intracranial atherosclerotic dis; Scans 11/15 revealed acute watershed infarcts in left frontal/ temporal/ parietal lobes & old infarcts in cerebellum & thalamus; DrTDeveshwar attempted intervention to left carotid system but this was reported as unsuccessful (unsucceeful attempt at revascularization of occluded LICA cavernous segment w/ multiple collaterals noted);  Last saw DrTDeveshwar 10/2014- note reviewed & 7/16 MRI/MRA & catheter angiogram reviewed=> tight focal stenosis of the right internal carotid artery distal cavernous segment, and the left vertebrobasilar junction suggestive of interval worsening. However no parenchymal changes suggestive of intervening or acute ischemic changes were noted on the DWI images- therefore they opted for continued medical management. ~  5/17: he remains on ASA81 + Plavix75 => clinically stable w/ his severe cerebrovasc dis... ~  10/2015:  He was HOSP at [facility] w/ lacunar infarct in left caudate nucleus => spont improvement, no change in meds, referred back to Neurology- DrAquino...  VENOUS INSUFFICIENCY (ICD-459.81) - he knows  to avoid sodium, elevate legs, wear support hose...  HYPERCHOLESTEROLEMIA (ICD-272.0) - on CRESTOR '10mg'$ /d since Hosp 12/12... ~  [place] 6/08 showed TChol 153, TG 120, HDL 36, LDL 93 ~  FLP 8/09 showed TChol 131, TG 84, HDL 38, LDL 76 ~  2010:  pt was not fasting for blood work... rec to continue Crestor10 + diet efforts. ~  FLP 10/11 showed TChol 210, TG 161, HDL 40, LDL 149... rec to start Crestor '20mg'$ /d, he will decide. ~  FLP 6/12 on Cres20 showed TChol 106, TG 95, HDL 41, LDL 46... Continue same. ~  [place] 12/12 in [place] (on Cres20 PTA) showed TChol 100, TG 101, HDL 37, LDL 43... They decr to Cres10. ~  FLP 3/13 on Cres10 showed TChol 118, TG 67, HDL 44, LDL 61... Continue same, later switched to Simva40 for $$ reasons. ~  FLP 10/14 on Simva40 showed TChol 136, TG 54, HDL 49, LDL 77... Continue same. ~  FLP 12/15 on Simva40 w/ ?compliance showed TChol 175, TG 105, HDL 41, LDL 113 => changed to [place] w/ supervision of all meds by wife. ~  FLP 5/16 on Atorva80 showed TChol 113, TG 51, HDL 48, LDL 54 => much improved, continue the same.  DIABETES MELLITUS (ICD-250.00) >>  ~  on METFORMIN '500mg'$ - 2Bid, GLYBURIDE '5mg'$ Bid, ACTOS '30mg'$ /d, & ONGLYZA '5mg'$ /d... ~  labs 6/08 showed BS= 155, HgA1c= 8.1.Marland Kitchen. (glyburide incr to 5Bid). ~  labs 11/08 showed BS= 352, HgA1c= 8.1.... (Actos added). ~  labs 8/09 showed BS= 128, HgA1c= 7.1.Marland KitchenMarland Kitchen rec- same meds, better diet, get wt down! ~  labs 2/10 showed BS= 249, A1c= 7.3.... Metform incr to 2Bid. ~  labs 12/10 (nonfasting- on all 3 meds) showed BS= 87, A1c= 6.9 ~  labs 10/11 on Metform1000Bid+Gybur5Bid showed BS= 180, A1c= 8.4.Marland KitchenMarland Kitchen rec to incr GLYBUR10Bid + diet. ~  labs 6/12 on Metform1000Bid+Glybur10Bid showed BS= 196, A1c= 11.2 ?what happened?> he declined insulin & Endocrine consult, opted for max oral meds by adding ACTOS '30mg'$ /d & ONGLYZA '5mg'$ /d... ~  Labs 12/12 in [place]  showed BS 150-200+ and A1c= 7.2; covered w/ SSI in hosp & placed back on 4 oral meds on disch from  rehab... ~  Labs 1/13 on all 4 meds showed BS= 230, A1c=7.1; we reviewed diet & he will start PT soon... ~  Labs 3/13 on 53mds showed BS= 72, A1c= 6.6 ~  4/13: pt called w/ hypoglycemia & we decreased Glyburide 578mfrom 2Bid to 1Bid... ~  6/13:  Pt on Metform500-2Bid, Glybur5m64md, Actos30, Onglyza5; BS at home 70-90 he says & still drinking Boost; Labs showed BS=190  A1c=7.4  ; we decided to cut the Glyburide5mg29mm only but keep the Metform500-2Bid +Actos +Onglyza... ~  10/13: on his 4med70mBS=171, A1c=7.7 but he's gained 16#; told to STOP the boost, get on diet, get wt down, take all meds every day... ~  10/14: seen by TP> Labs 10/14 showed BS=252, A1c=9.1 & Lantus added to his Metform500-2Bid, Diabeta5, Onglyza5; Lantus10u added... ~  11/14: on Lantus10u, Metform500-2Bid, Diabeta5, Onglyza5, off Actos; f/u labs 11/14 showed BS=182, A1c=8.3; continue same, get wt down. ~  8/15: on Lantus10u, Metform500-2Bid, Diabeta5, Onglyza5; Labs 8/15 showed BS=167, A1c=7.3... CoMarland KitchenMarland Kitcheninue meds and get wt down. ~  12/15: now on Lantus15u now, Metform500-2Bid, Diabeta5AM, Onglyza5PM; compliance w/ meds was poor when taking his own meds; Labs 12/15 showed BS=146-665, A1c=9.3 => now all supervised by wife. ~  2/16: on Lantus15u now, Metform500-2Bid, Diabeta5AM, Onglyza5PM; Labs 2/16 showed BS=96, A1c=7.6, therefore continue same meds & diet... ~  5/16: on Lantus15u, Metform500-2Bid, Diabeta5AM, Onglyza5PM; compliance w/ meds was poor but wife supervises now; Labs 5/16 shows BS=130, A1c=6.8 => much improved, continue same. ~  11/16: on Lantus15u, Metform500-2Bid, Diabeta5AM, Onglyza5PM; wife supervises meds; Labs 11/16 showed BS=226, A1c=6.7, continue same meds, better diet. ~  5/17: on same meds & doing satis;  Labs 5/17 showed random BS=142, A1c wasn't done as requested...  OVERWEIGHT (ICD-278.02) - he states that he is not on much of a diet & we reviewed low carb/ no sweets/ low fat diet. ~  weight 8/09 = 216# ~   weight 2/10 = 216# ~  weight 12/10 = 212# ~  weight 10/11 = 210# ~  Weight 6/12 = 206# and he admits to not being on much of a diet... ~  Weight 2/13 = non weight bearing still... ~  Weight 3/13 = 193# ~  Weight 6/13 = 191# ~  Weight 10/13 = 206# ~  Weight 11/14 = 198# ~  Weight 2/15 = 193# ~  Weight 1/16 = 181# ~  Weight 5/16 = 189# ~  Weight 11/16 = 189# ~  Weight 5/17 = 173# good job w/ wt reduction, diet, exercise...  GERD (ICD-530.81) - prev on Nexium40, he switched to OTC Prilosec, now PROTO[place]/91me to Plavix Rx; pt had stopped this on his own & asked to restart 10/13. ~  EGD 19/03 showed 4cmHH,[place] & esoph stricture dilated... ~  EGD 12/09 by DrPatterson showed HH & severe erosive esophagitis from GERD... ~  10/13: presents w/ incr reflux- w/ dysphagia & food sticking in mid-esoph that occurs every 2-3days & has to vomit to feel better; he had stopped his PPI on his own (?why?), rec to restart the Protonix40 & we will refer to GI for EGD & dilatation... ~  10/13: he saw DrPatterson> EGD 11/13 showed HH, chr GERD, stricture dil w/ 27F Maloney dilator, erosive gastritis was HPylori neg & treated w/ daily PPI rx...  ~  He remains on Protonix40/d & doing  satis w/o reflux symptoms...  DIVERTICULOSIS OF COLON (ICD-562.10) - note: he was hosp in [place] briefly 12/09 for fecal impaction; prev on [place] but recently noted loose stools on the Metformin 1051mBid... ~  colonoscopy 10/03 by DrPatterson showed divertics only... ~  colonoscopy 12/09 by DrPatterson was also normal x for divertics... ~  He denies n/v, c/d, blood seen...  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 Hx of RENAL CELL CANCER (ICD-189.0) - he had a partial nephrectomy of the right side of his horseshoe kidney in 1994 due to renal cell ca... [place] but we don't have any recent notes... ~  labs 10/11 showed PSA= 0.95 ~   Labs 3/13 showed BUN=21, Creat=1.1, PSA=2.75 ~  Labs 10/13 showed BUN= 25, Creat= 1.2 ~  Labs 10/14 showed BUN=38, Cr=1.5 (w/ enterococcus UTI);  Labs 11/14 showed BUN=29, Cr=1.2 ~  Labs 8/15 showed BUN= 24, Cr= 1.2 ~  Labs 12/15 showed Cr= 1.1-1.4 ~  Labs 2/16 showed BUN= 18, Cr= 1.12  Hx of NEPHROLITHIASIS (ICD-592.0) - remote hx of kidney stones followed by DrPeterson...  DEGENERATIVE JOINT DISEASE (ICD-715.90) &  GOUT (ICD-274.9) - on ALLOPURINOL 3015md... ~  labs 10/11 showed URIC= 4.7 ~  [facility] fall from ladder cleaning gutters w/ complex right tibial plat fx- ORIF, 2 operations by DrHardy, & hosp course complic by ileus, TIA, electrolyte abn, etc;  Event disch to Rehab & [facility]  BACK PAIN, LUMBAR (ICD-724.2) - known degenerative spondylitic disease at L4-5 and L5-S1 prev [facility] (now DrElsner & DrHarkins)... ~  6/12: he reports further back pain w/ eval by Chiropractor showing L4 disc prob & regular adjustments; but now notes incr pain & he requests referral to Neurosurg for further eval ==> seen by DrElsner, then DrHarkins for shots which has helped... ~  2014: he has continued f/u w/ DrHarkins/ [facility] & received periodic ESI injections for his Lumbar DDD; they added NORTRIPTYLINE 2573ms & slowly incr the dose  NEURO >> Hx Left Hemispheric TIA in 11/12 & MEMORY IMPAIRMENT ~  3/14:  He had Neuro eval DrSethi> he did Labs, EEG, MRI Brain, & MRA Brain & Neck; Rec to take 2 FishOil caps daily, CerefolinNAC daily, work puzzles etc, continue Plavix & strict control of BP, Chol, DM; they did not start Aricept... ~  11/15 & 12/15> see above> Hosp w/ wastershed strokes left frontal/ temporal/ parietal areas when he wasn't taking his Plavix; now all meds supervised by wife; he had MRIs, CT Angio, and Arteriograms w/ attempted intervention on left by DrTDeveshwar- unsuccessful & rec for continued ASA/ Plavix, &  control on BP, Chol, DM w/ BP in the 130-160 range... 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 ~  6/16: Eval by [facility], c/w mild cognitive impairment w/ MOCA=20/30, started on Aricept & B12 supplement... ~  4/17: f/u DrAquino 08/02/15- reviewed, tolerating Aricept, stable & independent w/ ADLs, drives a little, MMSKNLZ=76/73o ch in meds.   ANXIETY (ICD-300.00) - uses Zolloft50 & ALPRAZOLAM 0.5mg83m needed.  B12 DEFICIENCY >>  ~  5/16:  Labs showed B12 = 196 & rec to start 1000mc32m mouth daily & we will f/u labs... ~  11/16: f/u labs showed B12 = 426, continue B12 supplement  daily... ~  Labs 5/17:  CBC showed Hg= 11.1, MCV=94, and rec to add 1-a-day w/ Fe to the B12 daily supplement.   Past Surgical History:  Procedure Laterality Date  . CAROTID ENDARTERECTOMY  09/2000   Dr.Lawson  . CHOLECYSTECTOMY  9/04   Dr. Rebekah Chesterfield  . EXTERNAL FIXATION LEG  03/10/2011   Procedure: EXTERNAL FIXATION LEG;  Surgeon: Rozanna Box;  Location: [place];  Service: Orthopedics;  Laterality: Right;  . INCISIONAL HERNIA REPAIR  1/96   Dr.Leone  . NASAL SINUS SURGERY  12/96   Dr.Redman  . 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 ORIF TIBIA PLATEAU  03/24/2011   Procedure: OPEN REDUCTION INTERNAL FIXATION (ORIF) TIBIAL PLATEAU;  Surgeon: Rozanna Box;  Location: [place];  Service: Orthopedics;  Laterality: Right;  . RADIOLOGY WITH ANESTHESIA N/A 04/12/2014   Procedure: ANGIOPLASTY;  Surgeon: Rob Hickman, MD;  Location: [place];  Service: Radiology;  Laterality: N/A;  . SMALL INTESTINE SURGERY    . veterbral art angioplasty     x2    Outpatient Encounter Prescriptions as of 11/13/2015  Medication Sig Dispense Refill  . ALPRAZolam (XANAX) 0.5 MG tablet Take 0.5-1 tablets (0.25-0.5 mg total) by mouth 3 (three) times daily as needed. 90 tablet 0  . aspirin EC 81 MG EC  tablet Take 1 tablet (81 mg total) by mouth daily. 30 tablet 0  . atorvastatin (LIPITOR) 80 MG tablet TAKE 1 TABLET BY MOUTH EVERY DAY AT 6PM 30 tablet 2  . clopidogrel (PLAVIX) 75 MG tablet TAKE 1 TABLET BY MOUTH EVERY DAY 90 tablet 0  . donepezil (ARICEPT) 10 MG tablet Take 1 tablet (10 mg total) by mouth daily. 90 tablet 3  . glipiZIDE (GLUCOTROL XL) 5 MG 24 hr tablet Take 1 tablet (5 mg total) by mouth daily with breakfast. 90 tablet 3  . Insulin Glargine (LANTUS SOLOSTAR) 100 UNIT/ML Solostar Pen INJECT 10 UNITS INTO THE SKIN AT BEDTIME. (Patient taking differently: 15 Units. INJECT 10 UNITS INTO THE SKIN AT BEDTIME.) 15 pen 5  . losartan (COZAAR) 50 MG tablet TAKE 1 TABLET (50 MG TOTAL) BY MOUTH DAILY. 90 tablet 1  . metFORMIN (GLUCOPHAGE) 500 MG tablet TAKE 2 TABLETS TWICE A DAY 360 tablet 2  . pantoprazole (PROTONIX) 40 MG tablet TAKE 1 TABLET (40 MG TOTAL) BY MOUTH DAILY. 90 tablet 3  . saxagliptin HCl (ONGLYZA) 5 MG TABS tablet Take 1 tablet (5 mg total) by mouth daily. 90 tablet 3  . sertraline (ZOLOFT) 50 MG tablet TAKE 1 TABLET (50 MG TOTAL) BY MOUTH AT BEDTIME. 90 tablet 2  . [DISCONTINUED] ALPRAZolam (XANAX) 0.5 MG tablet TAKE 1 TABLET BY MOUTH 3 TIMES A DAY AS NEEDED (Patient not taking: Reported on 11/13/2015) 90 tablet 1   No facility-administered encounter medications on file as of 11/13/2015.     Allergies  Allergen Reactions  . Dilaudid [Hydromorphone Hcl] Other (See Comments)    hallucinations  . Fentanyl Other (See Comments)    hallucinations    Current Medications, Allergies, Past Medical History, Past Surgical History, Family History, and Social History were reviewed in [employer] record.    Review of Systems        See HPI - all other systems neg except as noted...  The patient complains of cyst on back & dyspnea on exertion.  The patient denies anorexia, fever, weight loss, weight gain, vision loss, decreased hearing, hoarseness, chest  pain, syncope, peripheral edema, prolonged cough, headaches, hemoptysis, abdominal pain, melena, hematochezia, severe indigestion/heartburn, hematuria, incontinence, muscle weakness, suspicious skin lesions, transient blindness, difficulty walking, depression, unusual weight change, abnormal bleeding, enlarged lymph nodes, and angioedema.     Objective:   Physical Exam     WD, WN, 75 y/o WM in NAD...  GENERAL:  Alert & oriented; pleasant & cooperative... HEENT:  [place]/AT, EOM-wnl, PERRLA, EACs-clear, TMs-wnl, NOSE-clear, THROAT-clear & wnl. NECK:  Supple w/ fairROM; no JVD; s/p left CAE, +bruits; no thyromegaly or nodules palpated; no lymphadenopathy. CHEST:  Clear to P & A; without wheezes/ rales/ or rhonchi heard... HEART:  Regular Rhythm; gr 1/6 SEM without rubs or gallops detected... ABDOMEN:  Soft & nontender; normal bowel sounds; no organomegaly or masses palpated... EXT: right knee deformity s/p ORIF, mod arthritic changes; no varicose veins/ +venous insuffic/ 1+ edema. NEURO:  CN's intact; no focal neuro deficits... DERM:  Quarter sized sebaceous cyst in mid back.   RADIOLOGY DATA:  Reviewed in the EPIC EMR & discussed w/ the patient...  LABORATORY DATA:  Reviewed in the EPIC EMR & discussed w/ the patient...   Assessment & Plan:    BP>  Neuro has wanted his BP to be sl higher than usual for perfusion reasons but BP= 170/80 on arrival & improved to 150/80 w/ rest; we opted to start Losartan 46m/d for BP & vasc dis... 5/16> BP= 144/76 today, continue Losar50... 9/16> BP= 120/60 & he remains asymptomatic... ~  Subsequent ROVs w/ stable BP on Losartan50 + diet, wt reduction...  CEREBROVASC Disease & STROKES 7/17, 11/15 & TIA 12/12>  Known severe cerebrovasc dis & he had TIA while in hosp for fx leg 2012 (he was off plavix at the time); Recurrent prob 11/15 w/ watershed infarcts in left frontal/ temporal/ parietal regions while he was off Plavix (he was taking meds on his own & hadn't  been filling the Plavix); s/p extensive eval w/ MRI/MRA, CT Angio & Arteriogram by DLadene Artistw/ attempt at intervention on left unsuccessful; wife now supervises all meds... He has prob multi-infarct dementia & wife requests Neuro 2nd opinion => [facility]...  NEURO> Mild Cognitive Impairment per DrSethi & wife is concerned re: memory/ vascular dementia; we will arrange for Neuro 2nd opinion=> started on Aricept & stable...  CHOL>  on Atrova80 now & f/u FLP much improved...  DM>  On Lantus15, Metform, Diabeta, Onglyza & compliance is better w/ wife supervision; Labs stable w/ A1c in the 6's...  Overweight>  We discussed low carb, low fat wt reducing diet;  Nice job w/ wt down to 173# 5/17 OV...  GI>  GERD prev controlled w/ Protonix but he stopped on his own (?why?); EGD 11/13 w/ dilatation & reminded to take the Protonix40 every day...  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  DJD/ Hx Gout/ LBP>  He was seeing chiropractor regularly regarding LBP & then f/u w/ DrElsner,Neurosurg & had ESI by DrHarkins & the shots have helped...  s/p fall w/ right Tibial plateaux fx> 1st ext fixation, 2nd ORIF>  By DrHardy, Ortho; exercise is difficult but very much needed; he's had extensive PT training...  Anxiety>  He remains on Alpraz Prn... Also has PSwayzeeon his list of meds...  B12 deficiency>  Labs 5/16 w/ B12 level = 196; rec to start oral B12 supplement ~10046m/d & repeat B12 level 11/16= 426   Patient's Medications  New Prescriptions   No medications on file  Previous Medications   ALPRAZOLAM (XANAX) 0.5 MG TABLET    Take 0.5-1 tablets (0.25-0.5 mg total) by mouth 3 (three) times daily as needed.   ASPIRIN EC 81 MG EC TABLET    Take 1 tablet (81 mg total) by mouth daily.   ATORVASTATIN (LIPITOR) 80 MG TABLET    TAKE 1 TABLET BY MOUTH EVERY DAY AT 6PM   CLOPIDOGREL (PLAVIX) 75 MG TABLET    TAKE 1 TABLET BY MOUTH  EVERY DAY   DONEPEZIL (ARICEPT) 10 MG TABLET    Take 1 tablet (10 mg total) by mouth daily.   GLIPIZIDE (GLUCOTROL XL) 5 MG 24 HR TABLET    Take 1 tablet (5 mg total) by mouth daily with breakfast.   INSULIN GLARGINE (LANTUS SOLOSTAR) 100 UNIT/ML SOLOSTAR PEN    INJECT 10 UNITS INTO THE SKIN AT BEDTIME.   LOSARTAN (COZAAR) 50 MG TABLET    TAKE 1 TABLET (50 MG TOTAL) BY MOUTH DAILY.   METFORMIN (GLUCOPHAGE) 500 MG TABLET    TAKE 2 TABLETS TWICE A DAY   PANTOPRAZOLE (PROTONIX) 40 MG TABLET    TAKE 1 TABLET (40 MG TOTAL) BY MOUTH DAILY.   SAXAGLIPTIN HCL (ONGLYZA) 5 MG TABS TABLET    Take 1 tablet (5 mg total) by mouth daily.   SERTRALINE (ZOLOFT) 50 MG TABLET    TAKE 1 TABLET (50 MG TOTAL) BY MOUTH AT BEDTIME.  Modified Medications   No medications on file  Discontinued Medications   ALPRAZOLAM (XANAX) 0.5 MG TABLET    TAKE 1 TABLET BY MOUTH 3 TIMES A DAY AS NEEDED

## 2015-11-26 ENCOUNTER — Telehealth (HOSPITAL_COMMUNITY): Payer: Self-pay

## 2015-11-26 NOTE — Telephone Encounter (Signed)
Called to schedule MRA, left message for pt to return call. AW

## 2015-12-02 ENCOUNTER — Other Ambulatory Visit: Payer: Self-pay | Admitting: Pulmonary Disease

## 2015-12-10 ENCOUNTER — Other Ambulatory Visit: Payer: Self-pay | Admitting: Pulmonary Disease

## 2015-12-10 MED ORDER — ATORVASTATIN CALCIUM 80 MG PO TABS: ORAL_TABLET | ORAL | 3 refills | Status: DC

## 2015-12-10 NOTE — Telephone Encounter (Signed)
Refill has been sent to the pharmacy. Nothing further is needed. 

## 2015-12-26 ENCOUNTER — Telehealth: Payer: Self-pay | Admitting: Pulmonary Disease

## 2015-12-26 NOTE — Telephone Encounter (Signed)
lmtcb x1 

## 2015-12-27 ENCOUNTER — Other Ambulatory Visit: Payer: Self-pay | Admitting: Pulmonary Disease

## 2015-12-27 MED ORDER — SAXAGLIPTIN HCL 5 MG PO TABS: 5.0000 mg | ORAL_TABLET | Freq: Every day | ORAL | 3 refills | Status: DC

## 2015-12-27 NOTE — Telephone Encounter (Signed)
Spoke with spouse. They need onglyza sent to their home. I have printed RX, sn has signed this and was placed in the mail. Nothing further needed

## 2016-01-10 ENCOUNTER — Other Ambulatory Visit: Payer: Self-pay | Admitting: Pulmonary Disease

## 2016-01-24 ENCOUNTER — Telehealth: Payer: Self-pay | Admitting: Pulmonary Disease

## 2016-01-24 MED ORDER — ALPRAZOLAM 0.5 MG PO TABS: ORAL_TABLET | ORAL | 5 refills | Status: DC

## 2016-01-24 NOTE — Telephone Encounter (Signed)
Called and spoke with pts wife and she is aware of rx that has been called to the pharmacy.

## 2016-01-24 NOTE — Telephone Encounter (Signed)
Received electronic refill request on  Alprazolam 0.5mg  Take 1/2-1 tablets by mouth 3 times a day as needed  #90 with no refills Last refilled on 06/28/15  SN please advise if okay to refill

## 2016-02-11 ENCOUNTER — Telehealth (HOSPITAL_COMMUNITY): Payer: Self-pay

## 2016-02-11 NOTE — Telephone Encounter (Signed)
Called to schedule f/u mra, left message for pt to return call. AW 

## 2016-02-24 ENCOUNTER — Ambulatory Visit: Payer: Medicare Other | Admitting: Pulmonary Disease

## 2016-03-12 ENCOUNTER — Other Ambulatory Visit (HOSPITAL_COMMUNITY): Payer: Self-pay | Admitting: Interventional Radiology

## 2016-03-12 DIAGNOSIS — I771 Stricture of artery: Secondary | ICD-10-CM

## 2016-03-18 ENCOUNTER — Ambulatory Visit: Payer: Medicare Other | Admitting: Neurology

## 2016-03-18 ENCOUNTER — Other Ambulatory Visit: Payer: Self-pay | Admitting: Pulmonary Disease

## 2016-03-20 ENCOUNTER — Ambulatory Visit (HOSPITAL_COMMUNITY)
Admission: RE | Admit: 2016-03-20 | Discharge: 2016-03-20 | Disposition: A | Discharge status: Home / Self Care | Payer: Medicare Other | Source: Ambulatory Visit | Attending: Interventional Radiology | Admitting: Interventional Radiology

## 2016-03-20 ENCOUNTER — Encounter (HOSPITAL_COMMUNITY): Payer: Self-pay | Admitting: Radiology

## 2016-03-20 ENCOUNTER — Ambulatory Visit (HOSPITAL_COMMUNITY): Payer: Medicare Other

## 2016-03-20 DIAGNOSIS — I6522 Occlusion and stenosis of left carotid artery: Secondary | ICD-10-CM | POA: Diagnosis not present

## 2016-03-20 DIAGNOSIS — I771 Stricture of artery: Secondary | ICD-10-CM | POA: Diagnosis not present

## 2016-03-20 DIAGNOSIS — I6782 Cerebral ischemia: Secondary | ICD-10-CM | POA: Insufficient documentation

## 2016-03-20 DIAGNOSIS — I6503 Occlusion and stenosis of bilateral vertebral arteries: Secondary | ICD-10-CM | POA: Diagnosis not present

## 2016-03-20 DIAGNOSIS — I6523 Occlusion and stenosis of bilateral carotid arteries: Secondary | ICD-10-CM | POA: Diagnosis not present

## 2016-03-20 LAB — CREATININE, SERUM
Creatinine, Ser: 1.34 mg/dL — ABNORMAL HIGH (ref 0.61–1.24)
GFR calc Af Amer: 58 mL/min — ABNORMAL LOW (ref >60)
GFR calc non Af Amer: 50 mL/min — ABNORMAL LOW (ref >60)

## 2016-03-20 MED ORDER — GADOBENATE DIMEGLUMINE 529 MG/ML IV SOLN
16.0000 mL | Freq: Once | INTRAVENOUS | Status: AC | PRN
  Administered 2016-03-20: 16 mL via INTRAVENOUS

## 2016-04-15 ENCOUNTER — Ambulatory Visit: Payer: Medicare Other | Admitting: Neurology

## 2016-04-16 ENCOUNTER — Telehealth (HOSPITAL_COMMUNITY): Payer: Self-pay

## 2016-04-16 NOTE — Telephone Encounter (Signed)
Left message for pt to return call. AW 

## 2016-04-20 ENCOUNTER — Encounter: Payer: Self-pay | Admitting: Neurology

## 2016-04-30 ENCOUNTER — Ambulatory Visit: Payer: Medicare Other | Admitting: Neurology

## 2016-05-06 DIAGNOSIS — H906 Mixed conductive and sensorineural hearing loss, bilateral: Secondary | ICD-10-CM | POA: Diagnosis not present

## 2016-05-06 DIAGNOSIS — H6123 Impacted cerumen, bilateral: Secondary | ICD-10-CM | POA: Diagnosis not present

## 2016-05-10 ENCOUNTER — Other Ambulatory Visit: Payer: Self-pay | Admitting: Pulmonary Disease

## 2016-05-13 ENCOUNTER — Ambulatory Visit (INDEPENDENT_AMBULATORY_CARE_PROVIDER_SITE_OTHER): Payer: Medicare Other | Admitting: Neurology

## 2016-05-13 ENCOUNTER — Encounter: Payer: Self-pay | Admitting: Neurology

## 2016-05-13 VITALS — BP 130/72 | HR 67 | Ht 66.0 in | Wt 172.1 lb

## 2016-05-13 DIAGNOSIS — F0390 Unspecified dementia without behavioral disturbance: Secondary | ICD-10-CM

## 2016-05-13 DIAGNOSIS — F03A0 Unspecified dementia, mild, without behavioral disturbance, psychotic disturbance, mood disturbance, and anxiety: Secondary | ICD-10-CM

## 2016-05-13 MED ORDER — DONEPEZIL HCL 10 MG PO TABS: 10.0000 mg | ORAL_TABLET | Freq: Every day | ORAL | 3 refills | Status: DC

## 2016-05-13 NOTE — Patient Instructions (Signed)
1. Continue Aricept 10mg daily 2. Control of blood pressure, cholesterol, as well as physical exercise and brain stimulation exercises are important for brain health. 3. Follow-up in 1 year, call for any changes 

## 2016-05-13 NOTE — Progress Notes (Signed)
NEUROLOGY FOLLOW UP OFFICE NOTE  ANGELIS Nicholson AU:[id number]  HISTORY OF PRESENT ILLNESS: I had the pleasure of seeing Peter Nicholson in follow-up in the neurology clinic on 05/13/2016.  He is again accompanied by his wife who helps supplement the history today. The patient was last seen 9 months ago for worsening memory. MMSE in April 2017 was 24/30. He is taking Aricept 10mg  daily without side effects. He feels his memory is fine. His wife feels memory is a little worse, he can't find things, especially his keys. He drives minimally and denies getting lost. His wife puts his medications in a pillbox and occasionally reminds him to take the medication. She is in charge of bill payments. No difficulties with ADLs. No personality changes. He denies any headaches, vision changes, focal numbness/tingling/weakness, bowel/bladder dysfunction, hallucinations, or falls. His wife reports some weight loss.  HPI: This is a pleasant 76 yo RH man with vascular risk factors including hypertension, diabetes, hyperlipidemia, peripheral vascular disease, TIA in 2012, and left MCA watershed infarcts secondary to left ICA stenosis, presenting for evaluation of worsening memory. He had previously seen neurologist Dr. Leonie Man in 2014 with his wife reporting mild memory difficulties since around 2012. He had mostly short-term memory difficulties, needing to be told several times and not remembering. His MMSE at that time was 27/30. His wife reports that after his strokes in November and December 2015, memory has been much worse. Hospital records were reviewed, in November he presented with aphasia, MRI showed left frontal and parietal watershed infarcts. His carotid angiogram showed severe preocclusive stenosis of the left proximal ICA, 80-85% stenosis of the right ICA proximal cavernous segment, 80% stenosis of the dominant left vertebral basilar junction just proximal to the basilar artery. He was discharged on aspirin and  Plavix. He returned to the ER a month later with confusion, anomia, and ataxia. MRI showed multiple ischemic infarcts in the left frontal and parietal lobes, as well as the posterior left temporal lobe at the temporoparietal junction, cerebral angiogram showed interval progression of stenosis of left ICA distal to origin of ophthalmic artery with severe near complete occlusion of proximal cavernous left ICA, 70% stenosis of proximal cavernous right ICA, and 60% stenosis of dominant left vertebral artery at origin. There was unsuccessful attempt at revascularization of occluded left ICA cavernous segment, and note of multiple collaterals in the left ICA cavernous occluded segment. He was discharged home with SBP goal between 130 to 160 and control of vascular risk factors.   He feels his memory is "average, there are times I cannot remember." His wife reports he is not remembering things he used to know to do, such as how to fix things around the house and which are the right tools to use. He has had word-finding difficulties since the stroke, his wife notices this once in a while. He forgets to take his medications, his wife now sets this out for him. He denies getting lost driving, his wife is concerned "somewhat," he occasionally cannot recall the way to go somewhere. He occasionally gets mad and frustrated, but no paranoia. He denies any alcohol or tobacco use, no significant head injuries. His mother had memory changes in her 57s.   PAST MEDICAL HISTORY: Past Medical History:  Diagnosis Date  . Anxiety state, unspecified   . Blood transfusion without reported diagnosis   . Bowel obstruction   . Calculus of kidney   . Cerebrovascular disease, unspecified   . Diaphragmatic hernia without mention  of obstruction or gangrene   . Diverticulosis of colon (without mention of hemorrhage)   . Esophageal reflux   . Gout, unspecified   . History of gallstones   . History of seizures   . HTN (hypertension)    . IBS (irritable bowel syndrome)   . Kidney carcinoma ([place])   . Lumbago   . Obstructive sleep apnea (adult) (pediatric)    doesnt wear CPAP  . Osteoarthrosis, unspecified whether generalized or localized, unspecified site    tendonitis  . Other specified congenital anomaly of kidney   . Overweight(278.02)   . Pure hypercholesterolemia   . Seizures ([place])    last seizure November 2012  . Stroke ([place])    2001  . Type II or unspecified type diabetes mellitus without mention of complication, not stated as uncontrolled   . Unspecified venous (peripheral) insufficiency     MEDICATIONS: Current Outpatient Prescriptions on File Prior to Visit  Medication Sig Dispense Refill  . ALPRAZolam (XANAX) 0.5 MG tablet Take 1/2 to 1 tablet by mouth three times daily as needed for anxiety 90 tablet 5  . aspirin EC 81 MG EC tablet Take 1 tablet (81 mg total) by mouth daily. 30 tablet 0  . atorvastatin (LIPITOR) 80 MG tablet TAKE 1 TABLET BY MOUTH EVERY DAY AT 6PM 90 tablet 3  . clopidogrel (PLAVIX) 75 MG tablet TAKE 1 TABLET BY MOUTH EVERY DAY 90 tablet 0  . donepezil (ARICEPT) 10 MG tablet Take 1 tablet (10 mg total) by mouth daily. 90 tablet 3  . glipiZIDE (GLUCOTROL XL) 5 MG 24 hr tablet TAKE 1 TABLET (5 MG TOTAL) BY MOUTH DAILY WITH BREAKFAST. 30 tablet 0  . Insulin Glargine (LANTUS SOLOSTAR) 100 UNIT/ML Solostar Pen INJECT 10 UNITS INTO THE SKIN AT BEDTIME. (Patient taking differently: 15 Units. INJECT 10 UNITS INTO THE SKIN AT BEDTIME.) 15 pen 5  . losartan (COZAAR) 50 MG tablet TAKE 1 TABLET (50 MG TOTAL) BY MOUTH DAILY. 90 tablet 1  . metFORMIN (GLUCOPHAGE) 500 MG tablet TAKE 2 TABLETS TWICE A DAY 360 tablet 2  . pantoprazole (PROTONIX) 40 MG tablet TAKE 1 TABLET (40 MG TOTAL) BY MOUTH DAILY. 90 tablet 3  . saxagliptin HCl (ONGLYZA) 5 MG TABS tablet Take 1 tablet (5 mg total) by mouth daily. 90 tablet 3  . sertraline (ZOLOFT) 50 MG tablet TAKE 1 TABLET (50 MG TOTAL) BY MOUTH AT BEDTIME. 90  tablet 2   No current facility-administered medications on file prior to visit.     ALLERGIES: Allergies  Allergen Reactions  . Dilaudid [Hydromorphone Hcl] Other (See Comments)    hallucinations  . Fentanyl Other (See Comments)    hallucinations    FAMILY HISTORY: Family History  Problem Relation Age of Onset  . Heart attack Father   . Dementia Mother   . Ovarian cancer Paternal Grandmother   . Breast cancer Sister   . Colon cancer Neg Hx   . Esophageal cancer Neg Hx   . Rectal cancer Neg Hx   . Stomach cancer Neg Hx     SOCIAL HISTORY: Social History   Social History  . Marital status: Married    Spouse name: Peter Nicholson  . Number of children: 2  . Years of education: N/A   Occupational History  . barber     still working   Social History Main Topics  . Smoking status: Never Smoker  . Smokeless tobacco: Never Used  . Alcohol use No  . Drug use:  No  . Sexual activity: No   Other Topics Concern  . Not on file   Social History Narrative   Patient drinks 4 cups of a caffinated Drinks week.    REVIEW OF SYSTEMS: Constitutional: No fevers, chills, or sweats, no generalized fatigue, change in appetite Eyes: No visual changes, double vision, eye pain Ear, Nicholson and throat: No hearing loss, ear pain, nasal congestion, sore throat Cardiovascular: No chest pain, palpitations Respiratory:  No shortness of breath at rest or with exertion, wheezes GastrointestinaI: No nausea, vomiting, diarrhea, abdominal pain, fecal incontinence Genitourinary:  No dysuria, urinary retention or frequency Musculoskeletal:  No neck pain, back pain Integumentary: No rash, pruritus, skin lesions Neurological: as above Psychiatric: No depression, insomnia, anxiety Endocrine: No palpitations, fatigue, diaphoresis, mood swings, change in appetite, change in weight, increased thirst Hematologic/Lymphatic:  No anemia, purpura, petechiae. Allergic/Immunologic: no itchy/runny eyes, nasal  congestion, recent allergic reactions, rashes  PHYSICAL EXAM: Vitals:   05/13/16 1402  BP: 130/72  Pulse: 67   General: No acute distress Head:  Normocephalic/atraumatic Neck: supple, no paraspinal tenderness, full range of motion Heart:  Regular rate and rhythm Lungs:  Clear to auscultation bilaterally Back: No paraspinal tenderness Skin/Extremities: No rash, no edema Neurological Exam: alert and oriented to person, place, month/day/season. No aphasia or dysarthria. Fund of knowledge is appropriate.  Recent and remote memory are intact.  Attention and concentration are normal.    Able to name objects and repeat phrases. CDT 4/5 MMSE - Mini Mental State Exam 05/13/2016 08/02/2015  Orientation to time 3 3  Orientation to Place 4 5  Registration 3 3  Attention/ Calculation 3 4  Recall 0 0  Language- name 2 objects 2 2  Language- repeat 1 1  Language- follow 3 step command 3 3  Language- read & follow direction 1 1  Write a sentence 1 1  Copy design 1 1  Total score 22 24   Cranial Nerves: Pupils equal, round, reactive to light. Extraocular movements intact with no nystagmus. Visual fields full. Facial sensation intact. No facial asymmetry. Tongue, uvula, palate midline.  Motor: Bulk and tone normal, muscle strength 5/5 throughout with no pronator drift.  Sensation to light touch intact.  No extinction to double simultaneous stimulation.  Deep tendon reflexes +1 throughout, toes downgoing.  Finger to Nicholson testing intact.  Gait narrow-based and steady, able to tandem walk adequately.  Romberg negative.  IMPRESSION: This is a 76 yo RH man with vascular risk factors including hypertension, diabetes, hyperlipidemia, peripheral vascular disease with 2 strokes in Nov/December 2015 due to left ICA occlusion, intracranial atherosclerosis, previous TIA in 2012, with mild cognitive impairment. MMSE today 22/30, with note of decline from last visit, indicating mild dementia. Continue Aricept 10mg   daily. We again discussed the importance of control of vascular risk factors and secondary stroke prevention, continue dual therapy with aspirin and Plavix. They know to go to ER immediately for any change in symptoms. The importance of physical exercise and brain stimulation exercises for brain health were again discussed. He will follow-up in 1 year and knows to call for any changes.   Thank you for allowing me to participate in his care.  Please do not hesitate to call for any questions or concerns.  The duration of this appointment visit was 25 minutes of face-to-face time with the patient.  Greater than 50% of this time was spent in counseling, explanation of diagnosis, planning of further management, and coordination of care.   Santiago Glad  Delice Lesch, [name]   CC: Dr. Lenna Gilford

## 2016-05-23 ENCOUNTER — Encounter: Payer: Self-pay | Admitting: Neurology

## 2016-05-23 DIAGNOSIS — F0390 Unspecified dementia without behavioral disturbance: Secondary | ICD-10-CM | POA: Insufficient documentation

## 2016-06-01 ENCOUNTER — Other Ambulatory Visit: Payer: Self-pay | Admitting: Pulmonary Disease

## 2016-06-07 ENCOUNTER — Other Ambulatory Visit: Payer: Self-pay | Admitting: Pulmonary Disease

## 2016-06-08 ENCOUNTER — Other Ambulatory Visit (INDEPENDENT_AMBULATORY_CARE_PROVIDER_SITE_OTHER): Payer: Medicare Other

## 2016-06-08 ENCOUNTER — Ambulatory Visit (INDEPENDENT_AMBULATORY_CARE_PROVIDER_SITE_OTHER): Payer: Medicare Other | Admitting: Pulmonary Disease

## 2016-06-08 ENCOUNTER — Encounter: Payer: Self-pay | Admitting: Pulmonary Disease

## 2016-06-08 VITALS — BP 150/60 | HR 64 | Temp 97.2°F | Ht 66.0 in | Wt 169.1 lb

## 2016-06-08 DIAGNOSIS — N289 Disorder of kidney and ureter, unspecified: Secondary | ICD-10-CM

## 2016-06-08 DIAGNOSIS — E559 Vitamin D deficiency, unspecified: Secondary | ICD-10-CM | POA: Diagnosis not present

## 2016-06-08 DIAGNOSIS — E7800 Pure hypercholesterolemia, unspecified: Secondary | ICD-10-CM | POA: Diagnosis not present

## 2016-06-08 DIAGNOSIS — Z23 Encounter for immunization: Secondary | ICD-10-CM

## 2016-06-08 DIAGNOSIS — I63032 Cerebral infarction due to thrombosis of left carotid artery: Secondary | ICD-10-CM

## 2016-06-08 DIAGNOSIS — N320 Bladder-neck obstruction: Secondary | ICD-10-CM | POA: Diagnosis not present

## 2016-06-08 DIAGNOSIS — E118 Type 2 diabetes mellitus with unspecified complications: Secondary | ICD-10-CM

## 2016-06-08 DIAGNOSIS — I10 Essential (primary) hypertension: Secondary | ICD-10-CM

## 2016-06-08 DIAGNOSIS — C641 Malignant neoplasm of right kidney, except renal pelvis: Secondary | ICD-10-CM | POA: Diagnosis not present

## 2016-06-08 DIAGNOSIS — E1165 Type 2 diabetes mellitus with hyperglycemia: Secondary | ICD-10-CM

## 2016-06-08 DIAGNOSIS — E538 Deficiency of other specified B group vitamins: Secondary | ICD-10-CM

## 2016-06-08 DIAGNOSIS — R413 Other amnesia: Secondary | ICD-10-CM | POA: Diagnosis not present

## 2016-06-08 DIAGNOSIS — I679 Cerebrovascular disease, unspecified: Secondary | ICD-10-CM

## 2016-06-08 DIAGNOSIS — D638 Anemia in other chronic diseases classified elsewhere: Secondary | ICD-10-CM

## 2016-06-08 DIAGNOSIS — IMO0002 Reserved for concepts with insufficient information to code with codable children: Secondary | ICD-10-CM

## 2016-06-08 DIAGNOSIS — I6523 Occlusion and stenosis of bilateral carotid arteries: Secondary | ICD-10-CM

## 2016-06-08 DIAGNOSIS — Q631 Lobulated, fused and horseshoe kidney: Secondary | ICD-10-CM | POA: Diagnosis not present

## 2016-06-08 DIAGNOSIS — Z794 Long term (current) use of insulin: Secondary | ICD-10-CM

## 2016-06-08 LAB — CBC WITH DIFFERENTIAL/PLATELET
BASOS PCT: 1 % (ref 0.0–3.0)
Basophils Absolute: 0.1 10*3/uL (ref 0.0–0.1)
EOS ABS: 0.4 10*3/uL (ref 0.0–0.7)
EOS PCT: 5.9 % — AB (ref 0.0–5.0)
HCT: 32.4 % — ABNORMAL LOW (ref 39.0–52.0)
HEMOGLOBIN: 10.9 g/dL — AB (ref 13.0–17.0)
Lymphocytes Relative: 13.5 % (ref 12.0–46.0)
Lymphs Abs: 1 10*3/uL (ref 0.7–4.0)
MCHC: 33.6 g/dL (ref 30.0–36.0)
MCV: 97.2 fl (ref 78.0–100.0)
MONO ABS: 0.6 10*3/uL (ref 0.1–1.0)
Monocytes Relative: 8.1 % (ref 3.0–12.0)
Neutro Abs: 5.1 10*3/uL (ref 1.4–7.7)
Neutrophils Relative %: 71.5 % (ref 43.0–77.0)
PLATELETS: 209 10*3/uL (ref 150.0–400.0)
RBC: 3.33 Mil/uL — ABNORMAL LOW (ref 4.22–5.81)
RDW: 12.1 % (ref 11.5–15.5)
WBC: 7.1 10*3/uL (ref 4.0–10.5)

## 2016-06-08 LAB — LIPID PANEL
CHOLESTEROL: 111 mg/dL (ref 0–200)
HDL: 49.4 mg/dL (ref >39.00)
LDL CALC: 35 mg/dL (ref 0–99)
NonHDL: 61.34
TRIGLYCERIDES: 132 mg/dL (ref 0.0–149.0)
Total CHOL/HDL Ratio: 2
VLDL: 26.4 mg/dL (ref 0.0–40.0)

## 2016-06-08 LAB — COMPREHENSIVE METABOLIC PANEL
ALBUMIN: 3.8 g/dL (ref 3.5–5.2)
ALT: 15 U/L (ref 0–53)
AST: 16 U/L (ref 0–37)
Alkaline Phosphatase: 70 U/L (ref 39–117)
BUN: 38 mg/dL — ABNORMAL HIGH (ref 6–23)
CALCIUM: 9.8 mg/dL (ref 8.4–10.5)
CHLORIDE: 109 meq/L (ref 96–112)
CO2: 25 meq/L (ref 19–32)
CREATININE: 1.28 mg/dL (ref 0.40–1.50)
GFR: 57.99 mL/min — ABNORMAL LOW (ref >60.00)
Glucose, Bld: 181 mg/dL — ABNORMAL HIGH (ref 70–99)
POTASSIUM: 4.5 meq/L (ref 3.5–5.1)
Sodium: 141 mEq/L (ref 135–145)
Total Bilirubin: 0.3 mg/dL (ref 0.2–1.2)
Total Protein: 6.3 g/dL (ref 6.0–8.3)

## 2016-06-08 LAB — VITAMIN B12: VITAMIN B 12: 704 pg/mL (ref 211–911)

## 2016-06-08 LAB — TSH: TSH: 2.81 u[IU]/mL (ref 0.35–4.50)

## 2016-06-08 LAB — VITAMIN D 25 HYDROXY (VIT D DEFICIENCY, FRACTURES): VITD: 38.38 ng/mL (ref 30.00–100.00)

## 2016-06-08 LAB — PSA: PSA: 1.16 ng/mL (ref 0.10–4.00)

## 2016-06-08 NOTE — Progress Notes (Signed)
Subjective:    Patient ID: Peter Nicholson, male    DOB: [birth year], 76 y.o.   MRN: [id number]  HPI 76 y/o Peter Nicholson here for a follow up visit... Peter Nicholson has multiple medical problems as noted below...  ~  SEE PREV EPIC NOTES FOR OLDER DATA >>     Hx OSA but Peter Nicholson's been off CPAP x yrs & denies sleep disordered breathing, denies snoring, feels Peter Nicholson rests well, & wakes refreshed w/o daytime hypersomnolence but Peter Nicholson takes a nap every day...  known severe extracranial cerebrovasc dis w/ left CAE 2000 by [facility] & known severe bilat cavernous ICA stenoses; Peter Nicholson is also s/p vertebral art angioplasty x2 by DrTDeveshwar w/ known basilar art occlusion  Peter Nicholson has severe intracranial atherosclerotic dis and chr cerebellar infarcts on prev scans; memory problems felt to be mild cognitive impairment by DrSethi on eval 3/14 (but Peter Nicholson is clearly high risk for vascular dementia problems)...   LABS 10/14 in Epic> REVIEWED.Marland KitchenMarland Kitchen   LABS 11/14:  Chems- OK x BS=182, A1c=8.3, BUN=29, Cr=1.2.Marland KitchenMarland Kitchen   LABS 8/15:  Chems- ok w/ BS=167, A1c=7.3, Cr=1.2.Marland Kitchen.  ~  Adm 11/15- 02/28/14 by [facility] when Peter Nicholson presented w/ dysarthria; hx severe vasc dis w/ known severe extracranial cerebrovasc dis w/ left CAE 2000 by DrLawson & known severe bilat cavernous ICA stenoses; Peter Nicholson is also s/p vertebral art angioplasty x2 by DrTDeveshwar w/ known basilar art occlusion; Peter Nicholson was taking meds on his own & not monitored by family- Peter Nicholson had stopped his Plavix (confused, didn't remember), ?DM meds & Simva; Hosp eval included CT Head & MRI/MRA Brain showing old cerebellar & thalamic infarcts and small vessel dis, acute sm nonhemorrhaghic infarcts throughout the left frontal/ temporal/ & parietal areas; Left ICA is occluded (reconstitution of flow in supraclinoid region appears inadeq), mod stenosis of Right ICA cavernous segment; occluded right vertebral and right PICA; high grade stenosis of Left vertebral art & mod narrowing of prox basilar art- see full report!  Subseq CT Angio Head  & Neck showed prev Left CAE markedly attenuated & occluded at skullbase/ petrous segment of the left ICA, diminutive right vertebral that occludes at the skullbase, dominant left vertebral w/ hi-grade stenosis distally, diffuse atherosclerotic changes... 2DEcho was neg for embolic source... Peter Nicholson was placed back on ASA/Plavix & no intervention recommended, reminded about need for strict regulation of BP, Chol, DM... Speech improved in [place] for outpt f/u w/ DrSethi (Peter Nicholson has not returned)... ~  Adm 12/27- 04/13/14 by [facility] w/ confusion, ataxia, & speech difficulty w/ BS found to be 666 (this is when it was discovered that Peter Nicholson was taking his own meds without supervision & several meds hadn't been filled in months); Peter Nicholson had an Enterococcus UTI- treated w/ Augmentin; repeat MRI/MRA Brain w/o change from Nov; Peter Nicholson was re-evaluated by [facility] for IR- cerebral angiography performed and attempted revasc of the left ICA was attempted but unsuccessful, Peter Nicholson was disch home on same meds (ASA/Plavix) w/ supervision of all meds from wife; Lisinopril was held & they rec keeping BP in the 130-160 range...   LABS 5/16:  FLP- at goals on Atorva80;  Chems- ok w/ BS=130, A1c=6.8, Cr=1.27;  CBC- ok w/ Hg=12.4;  B12 level= 196...  ~  09/2014:  Peter Nicholson went to see [facility] for [facility]Neuro 6/16 noting memory loss, MOCA=20/30, hx of severe vasc dis etc; they started Aricept5=>10, MVI, and B12 supplement; also rec physical exercise & brain stimulation thru puzzles etc. ~  10/2014: Peter Nicholson had f/u w/ DrDeveshwar from IR>  MRI/MRA of the Brain revealed chronic ischemic changes stable from his prev scans, neg for acute infarcts; chr occlusion of left ICA, mod-severe stenosis of the right cavernous carotid, occlusion of the distal right vertebral art, mod-severe stenosis of the distal left vertebral as well; Peter Nicholson is continuing to follow the pt...  ~  02/2015:  Peter Nicholson is too sedentary- not exercising at all, just watching TV, wife says Peter Nicholson won't read/ work  puzzles/ do anything substantive regarding physical or mental exercises...   LABS 02/25/15 showed BS=226, A1c=6.7, K=5.5, Cr=1.15, B12=426...   ~  Aug 22, 2015:  42moROV & Peter Nicholson indicates that Peter Nicholson is doing well, no new complaints or concerns- and his wife confirms; due for f/u CXR & full labs today but Peter Nicholson is not fasting, therefore we will elim the FLP & check everything else; they request refill of Alpraz0.5=> done... We reviewed the following medical problems during today's office visit >>     OSA> Peter Nicholson's been off CPAP x yrs & denies sleep disordered breathing, denies snoring, feels Peter Nicholson rests well, & wakes refreshed w/o daytime hypersomnolence but Peter Nicholson takes a nap every day...    HBP> on Losartan50; BP= 132/60 & reminded to avoid sodium & keep weight down; Peter Nicholson is essentially asymptomatic w/o CP, palpit, SOB, edema, etc...    Cerebrovasc dis> on his ASA81 & Plavix75 (meds supervised by wife); known [facility]/ [facility] known severe bilat cavernous ICA stenoses (occluded left); Peter Nicholson is also s/p vertebral art angioplasty x2 by DAzell Derw/ known basilar art occlusion & severe intracranial atherosclerotic dis; Scans 11/15 revealed acute watershed infarcts in left frontal/ temporal/ parietal lobes & old infarcts in cerebellum & thalamus; DrTDeveshwar attempted intervention to left carotid system but this was reported as unsuccessful (unsucceeful attempt at revascularization of occluded LICA cavernous segment w/ multiple collaterals noted);  Last saw DrTDeveshwar 10/2014- note reviewed & 7/16 MRI/MRA & catheter angiogram reviewed=> tight focal stenosis of the right internal carotid artery distal cavernous segment, and the left vertebrobasilar junction suggestive of interval worsening. However no parenchymal changes suggestive of intervening or acute ischemic changes were noted on the DWI images- therefore they opted for continued medical management.    Chol> on Lip80 now +  diet; last FLP 5/16 showed TChol 113, TG 51, HDL 48, LDL 54 => much improved, continue the same; reminded to come fasting for f/u FLP.    DM> wt is down 11# to 173# today; on Lantus15u now, Metform500-2Bid, Diabeta5AM, Onglyza5PM; compliance w/ meds was poor but wife supervises now; Labs 5/16 shows BS=130, A1c=6.8 => Labs 11/16 showed BS=226, A1c=6.7 => Labs 5/17 showed BS=142 & A1c wasn't done; Rec to continue same meds, good job w/ wt loss...    GU- Hx horseshoe kidney, renal cell cancer, nephrolithiasis> right side of horseshoe kid removed 1994 (RCCa); followed by Urology for yrs & no recurrence; Creat in the 1.1-1.5 range; PSA in the 1-3 range; Labs 5/17 showed BUN=53, Cr=1.43, PSA=1.01; Rec to incr water intake...    Neuro- memory impairment> on ASA81 & Plavix75, plus MVI etc; Peter Nicholson has severe intracranial atherosclerotic dis, chr cerebellar infarcts on prev scans, & new watershed infarcts on left 11/15; memory problems felt to be mild cognitive impairment by DrSethi on eval 3/14, vascular dementia in light of numerous sm infarcts on scans; [facility], MOCA=20/30, hx of severe vasc dis etc; they started Aricept5=>10, MVI,  and B12 supplement; also rec physical exercise & brain stimulation thru puzzles etc; Last f/u DrAquino 08/02/15- reviewed, tolerating Aricept, stable & independent w/ ADLs, drives a little, DSKA=76/81, no ch in meds.     Anxiety/Depression> on Xanax0.5 prn & Zoloft50...     B12 deficiency> Labs 5/16 showed B12 level = 196 & rec to add 1077mg VitB12 OTC to his MVI daily; Labs 11/16 showed B12=426...    Mild Anemia>  Labs 5/17 showed Hg=11.1, MCV=94;  Rec to add 1-a-day w/ Fe to his B12 supplement EXAM shows Afeb, VSS w/ BP=126/68 & O2sat=98% on RA;  HEENT- neg, mallampati2;  Chest- clear w/o w/r/r;  Heart- RR, w/o m/r/g;  Abd- soft, neg;  Ext- neg w/o c/c/e...  CXR 08/22/15 showed norm heart size, atherosclerotic Ao,  clear lungs, NAD; arthritis in Tspine...   LABS 5/17>  FLP- not done;  Chems- ok x BUN=53, Cr=1.43, BS=142 (rec to incr free water + low carb diet);  CBC- ok x Hg=11.1, MCV=94 (rec to add 1-a-day w/ Fe to his B12 supplement daily);  TSH=2.50;  PSA=1.01... IMP/PLAN>>  Peter Nicholson mostly stable- Peter Nicholson's followed by Neuro & IR for his cerebrovasc dis & MCI on Aricept;  Medically improved w/ wt reduction- good job!  Rec to continue same meds regularly, diet, exercise, drink more free water, & add 1-a-ady w/ Fe to his B12 vit daily...  ~  November 13, 2015:  321moOV &post hosp check>  [facility]> presented w/ unsteady gait, slurred speech & word finding problems; eval revealed an acute left brain stroke (MRI showed lacunar infarct in left caudate nucleus) & chronically occluded left ICA w/ stenosis in right ICA and left vertebral art => they rec that Peter Nicholson [facility] on DiMaceoome (see below);  Since disch they note slurring has resolved, unsteadiness about the same & no change over the last several months-- we discussed referral & f/u by NEURO, DrAquino (to consider poss Neuro-rehab) but they decline f/u appt w/ DrTDeveshwar at this time...  we reviewed the following medical problems during today's office visit >>     OSA> Peter Nicholson's been off CPAP x yrs & denies sleep disordered breathing, denies snoring, feels Peter Nicholson rests well, & wakes refreshed w/o daytime hypersomnolence but Peter Nicholson takes a nap every day...    HBP> on Losartan50 + diet; BP= 126/62 & reminded to avoid sodium & keep weight down; Peter Nicholson is essentially asymptomatic w/o CP, palpit, SOB, edema, etc...    Severe cerebrovasc dis> on his ASA81 & Plavix75 (meds supervised by wife); known severe extracranial [facility]asc [facility]/ [facility] known severe bilat cavernous ICA stenoses (occluded left); Peter Nicholson is also s/p vertebral art angioplasty x2 by DrAzell Der/ known  basilar art occlusion & severe intracranial atherosclerotic dis; Scans 11/15 revealed acute watershed infarcts in left frontal/ temporal/ parietal lobes & old infarcts in cerebellum & thalamus; DrTDeveshwar attempted intervention to left carotid system but this was reported as unsuccessful (unsucceeful attempt at revascularization of occluded LICA cavernous segment w/ multiple collaterals noted);  Last saw DrTDeveshwar 10/2014- note reviewed & 7/16 MRI/MRA & catheter angiogram reviewed=> tight focal stenosis of the right internal carotid artery distal cavernous segment, and the left vertebrobasilar junction suggestive of interval worsening- they opted for continued medical management;  ADM to [facility] 10/2015 w/ acute lacunar infarct in left caudate nucleus- DC on same  ASA/PLAVIX to f/u w/ Neuro & IR...    Chol> on Lip80 + diet; last FLP 5/16 showed TChol 113, TG 51, HDL 48, LDL 54 => much improved, continue the same; reminded to come fasting for f/u FLP.    DM> wt is down 3# to 170# today; on Lantus15u now, Metform500-2Bid, Glucotrol5AM, Onglyza5PM; compliance w/ meds was poor but wife supervises now; Labs 5/16 shows BS=130, A1c=6.8 => Labs 11/16 showed BS=226, A1c=6.7 => Labs 5/17 showed BS=142 & A1c wasn't done; Rec to continue same meds, good job w/ wt loss...    GU- Hx horseshoe kidney, renal cell cancer, RI, nephrolithiasis> right side of horseshoe kid removed 1994 (RCCa); followed by Urology for yrs & no recurrence; Creat in the 1.1-1.5 range; PSA in the 1-3 range; Labs 5/17 showed BUN=53, Cr=1.43, PSA=1.01; Rec to incr water intake...    Neuro- memory impairment> on ASA81 & Plavix75, plus MVI etc; Peter Nicholson has severe intracranial atherosclerotic dis, chr cerebellar infarcts on prev scans, & new watershed infarcts on left 11/15; memory problems felt to be mild cognitive impairment by DrSethi on eval 3/14, vascular dementia in light of numerous sm infarcts on scans; wife wants 2nd opinion Neuro & we referred to  [facility] 6/16 noting memory loss, MOCA=20/30, hx of severe vasc dis etc; they started Aricept5=>10, MVI, and B12 supplement; also rec physical exercise & brain stimulation thru puzzles etc; Last f/u DrAquino 08/02/15- reviewed, tolerating Aricept, stable & independent w/ ADLs, drives a little, FGHW=29/93, no ch in meds.     Anxiety/Depression> on Xanax0.5 prn & Zoloft50...     B12 deficiency> Labs 5/16 showed B12 level = 196 & rec to add 1029mg VitB12 OTC to his MVI daily; Labs 11/16 showed B12=426...    Mild Anemia>  Labs 5/17 showed Hg=11.1, MCV=94;  Rec to add 1-a-day w/ Fe to his B12 supplement EXAM shows Afeb, VSS w/ BP=126/62 & O2sat=100% on RA;  HEENT- neg, mallampati2;  Chest- clear w/o w/r/r;  Heart- RR, w/o m/r/g;  Abd- soft, neg;  Ext- neg w/o c/c/e; Neuro- sl slow mentation, no focal deficits...  CT Head [facility] 10/22/15>  No acute changes-- low attenuation in left frontal & left parietal lobes c/w old infarcts, old right cerebellar infarct...  MRI Brain [facility] 10/25/15>  small acute lacunar infarct in the left caudate nucleus; no associated hemorrhage or mass effect; underlying severe chronic ischemia and circle of Willis vasculopathy (including chronic left ICA occlusion) as described on the comparison MRI/MRA from 04/2015...  EKG [facility] 10/26/15>  NSR, rate60, wnl, NAD...  2DEcho [facility] 10/25/15>  normal left ventricle size and function, with no regional wall motion abn, EZJ=69-67% normal diastolic function, no evidence of elevated left atrial pressure, no evidence of systolic or diastolic heart failure, no cardiac source of emboli detected, mild aortic valve sclerosis, good mobility, trace regurgitation and no evidence of stenosis.  LABS [facility] 10/2015>  Chems- ok x BS=68-199, BUN=37, Cr=1.46;  CBC- Hg=11.1..Marland KitchenMarland Kitchen IMP/PLAN>>  Peter Nicholson back to his baseline which includes severe cerebrovasc dis- s/p mult interventions [facility] DrTDeveshwar [facility] remains on ASA/ Plavix & we will  refer to Neuro-DrAquino for her review; pt does not want uKoreato set up a follow up appt w/ DrDeveshwar at this time... We plan recheck 344mo.   ~  June 08, 2016:  6-37m52moV & Peter Nicholson has a relatively quiet interval since Peter Nicholson was last seen 11/2015> wife notes that memory seems sl worse, repeating  himself, etc but Peter Nicholson has not required [facility] or ER visits since the [place] for an acute left sided lacunar infarct;  we reviewed EPIC data & the following medical problems during today's office visit >>     Peter Nicholson had IR f/u eval by [facility] 03/2016 w/ MRI/ MRA done>  It was stable compared to 04/2015 w/ left ICA occlusion, high-grade right cavernous ICA narrowing, chronic right vertebral occlusion & hig-grade distal left verttebral stenosis, no acute infarcts...    Peter Nicholson had ENT eval DrBates 05/06/16>  Hearing loss & cerumen removed...    Peter Nicholson had a Neuro f/u appt w/ DrAquino 05/13/16>  cerebrovasc dis, vasc dementia on Aricept10, memory about the same- MMSE=22/30, again stressed the need for phys activ & mental exercoise...    OSA> Peter Nicholson's been off CPAP x yrs & denies sleep disordered breathing, denies snoring, feels Peter Nicholson rests well, & wakes refreshed w/o daytime hypersomnolence but Peter Nicholson takes a nap every day...    HBP> on Losartan50 + diet; BP= 150/60 & reminded to avoid sodium & keep weight down; Peter Nicholson is essentially asymptomatic w/o CP, palpit, SOB, edema, etc...    Severe cerebrovasc dis> on his ASA81 & Plavix75 (meds supervised by wife); known severe extracranial cerebrovasc dis w/ [facility] & known severe bilat cavernous ICA stenoses (occluded left); Peter Nicholson is also s/p vertebral art angioplasty x2 by Azell Der w/ known basilar art occlusion & severe intracranial atherosclerotic dis; Scans 11/15 revealed acute watershed infarcts in left frontal/ temporal/ parietal lobes & old infarcts in cerebellum & thalamus; DrTDeveshwar attempted intervention to left carotid system but this was reported as unsuccessful  (unsucceeful attempt at revascularization of occluded LICA cavernous segment w/ multiple collaterals noted);  Last saw DrTDeveshwar 10/2014- note reviewed & 7/16 MRI/MRA & catheter angiogram reviewed=> tight focal stenosis of the right internal carotid artery distal cavernous segment, and the left vertebrobasilar junction suggestive of interval worsening- they opted for continued medical management;  ADM to [facility] 10/2015 w/ acute lacunar infarct in left caudate nucleus- DC on same ASA/PLAVIX to f/u w/ Neuro & IR;  F/u IR assessment 03/2016 by DrDevershwar as above...    Chol> on Lip80 + diet; last FLP 2/18 showed TChol 111, TG 132, HDL 49, LDL 35 => much improved, continue the same; reminded to come fasting for f/u FLP.    DM> wt is stable at 169# today; on Lantus15u now, Metform500-2Bid, Glucotrol5AM, Onglyza5PM; compliance w/ meds was poor but wife supervises now; Labs w/ BS ~140-180, A1c=6.7;  Rec to continue same meds + diet.    GU- Hx horseshoe kidney, renal cell cancer, RI, nephrolithiasis> right side of horseshoe kid removed 1994 (RCCa); followed by Urology for yrs & no recurrence; Creat in the 1.1-1.5 range; PSA in the 1-3 range; Labs 2/18 showed BUN=38, Cr=1.28, PSA=1.16; Rec to incr water intake...    Neuro- memory impairment> on ASA81 & Plavix75, plus MVI etc; Peter Nicholson has severe intracranial atherosclerotic dis, chr cerebellar infarcts on prev scans, & new watershed infarcts on left 11/15; memory problems felt to be mild cognitive impairment by DrSethi on eval 3/14, vascular dementia in light of numerous sm infarcts on scans; wife wants 2nd opinion Neuro & [facility]16 noting memory loss, MOCA=20/30, hx of severe vasc dis etc; they started Aricept5=>10, MVI, and B12 supplement; also rec physical exercise & brain stimulation thru puzzles etc; Last f/u DrAquino 1/18- reviewed, tolerating Aricept, stable & independent w/ ADLs, drives a little, YTKZ=60/10, no ch  in meds.      Anxiety/Depression> on Xanax0.5 prn & Zoloft50...     B12 deficiency> Labs 2/18 showed B12 level = 704 on 1019mg VitB12 OTC + MVI daily    Mild Anemia>  Labs 2/18 showed Hg=10.9, MCV=94;  Rec to add 1-a-day w/ Fe to his B12 supplement EXAM shows Afeb, VSS w/ BP=150/60 & O2sat=100% on RA;  HEENT- neg, mallampati2;  Chest- clear w/o w/r/r;  Heart- RR, w/o m/r/g;  Abd- soft, neg;  Ext- neg w/o c/c/e; Neuro- sl slow mentation, no focal deficits...  MRI/ MRA Brain done 03/20/16 by DrTDeveshwar>  stable compared to 04/2015 w/ left ICA occlusion, high-grade right cavernous ICA narrowing, chronic right vertebral occlusion & hig-grade distal left verttebral stenosis, no acute infarcts...  LABS 05/2016>  FLP- at goals on Lip80;  Chems- ok w/ Cr=1.28. BS=181;  CBC- mild anemia w/ Hg=10.9, mcv=97;  TSH=2.81;  PSA=1.16;  B12=704, VitD=38 IMP/PLAN>>  JArekhas had a relatively quiet/ stable interval & is doing satis on his current regimen;  On ASA/ Plavix & Losar50 w/ adeq BP control x "white coat" (reminded no salt);  DM treated w/ Lantus 15u + 3 meds & labs are reasonably well maintained;  Continue same meds, PLEASE incr phys activity & mental exercises, rov 698mo.          Problem List:     Hearing Loss >> [facility] Nich[facility] w/ sensorineural hearing loss & rec to consider hearing aides...   OBSTRUCTIVE SLEEP APNEA (ICD-327.23) - s/p split night sleep study 11/08- showing an AHI of 19, assoc w/ an O2 sat down to 78%... during the CPAP trial Peter Nicholson titrated up to a pressure of 11cm but never ret to REM sleep so optimal pressure is still ???...Peter Nicholson uses [facility] for his CPAP needs... ~  12/10: Peter Nicholson tells me that Peter Nicholson has stopped the CPAP, that Peter Nicholson sleeps better without it, not snoring/ rests well, & wakes feeling refreshed... Peter Nicholson refuses Sleep Med re-eval etc... ~  11/14: Peter Nicholson's been off CPAP x yrs & denies sleep disordered breathing, denies snoring, feels Peter Nicholson rests well, & wakes refreshed w/o  daytime hypersomnolence, Peter Nicholson naps daily... ~  2015> Peter Nicholson reports stable & denies sleep disordered breathing...  Hx of HYPERTENSION >>   ~  not currently on medications =>  ~  6/12: BP= 136/80 off meds & denies HA, visual changes, CP, palipit, dizziness, syncope, dyspnea, edema, etc... Peter Nicholson states that home BP checks are "OK". ~  CXR 11/12 showed borderline heart size, tort Ao, clear lungs w/ ?underlying COPD, NAD...  ~  1/13: [facility] Nich[facility] Las[facility]; both stopped 1/13 by TP w/ renal insuffic & elev K+; subseq improved & BP stable... ~  2/13: BP= 112/68 & feeling weak etc...  3/13:  BP= 140/72 & feeling some better... ~  6/13:  BP= 138/78 & feeling much better; denies CP, palpit, ch in SOB or edema... ~  10/13:  BP= 140/82 & Peter Nicholson is c/o reflux & dysphagia today, no CP/ palpit/ SOB/ edema... ~  11/14: not currently on meds, just diet controlled; BP= 140/86 & reminded to avoid sodium & get weight down; Peter Nicholson is essentially asymptomatic w/o CP, palpit, SOB, edema, etc. ~  2/15: still not on meds, just diet controlled; BP= 124/76 & Peter Nicholson remains asymptomatic...  ~  8/15: not currently on meds, just diet controlled; BP= 142/84 & reminded to avoid sodium & get  weight down; Peter Nicholson is essentially asymptomatic w/o CP, palpit, SOB, edema, etc ~  1/16: still not on meds, just diet controlled; BP= 132/80 & Neuro has rec BP range of 130-160 to aid perfusion due to his severe cerebrovasc dis... ~  2/16: BP 170/80 => recheck 150/80 range; we decided to add Losartan 47m/d for BP & vasc dis... ~  5/16: on Losartan50; BP= 144/76 & Peter Nicholson says Peter Nicholson's asymptomatic, continue same... ~  11/16: on Losartan50; BP= 126/68 & reminded to avoid sodium & get weight down; Peter Nicholson is essentially asymptomatic w/o CP, palpit, SOB, edema, etc... ~  5/17: on Losartan50; BP= 132/60 & reminded to avoid sodium & keep weight down; Peter Nicholson remains largely asymptomatic.  CEREBROVASCULAR DISEASE (ICD-437.9) - stable on ASA 3234md &  PLAVIX 7520m per DrDeveshwar/ DrLawson/ DrReynolds... Peter Nicholson has severe extracranial cerebrovasc disease- esp in the post circulation, and Peter Nicholson is s/p vertebral art angioplasty x 2, and w/ a known basilar art occlusion- prev on Coumadin controlled by Neurology (switched to ASA/ Plavix in 2010)... also followed by DrLSheryn Bisond DrDeveshwar for IR... Peter Nich[facility]  CD[facility]ler 2/07 showed 0-39% bilat ICA stenoses, s/p left CAE w/ DPA... ~  eval 3/10 by DrLSheryn Bison CDopplers that showed similar patency w/ 0-39% bilat ICA stenoses; due to the 10 yr interval DrLawson did not feel Peter Nicholson needed any additional follow up. ~  12/10: continued f/u DrDevershwar IR w/ MRI/ MRAs showing bilat carotid siphon region atherosclerosis & narrowing (unchanged), and markedly diseased distal left vertebral & prox basilar art narrowing (no change)... ~  11/12: CTA head & neck showed similar severe dis w/ mod to severe bilat cavernous ICA stenoses due to extensive atherosclerosis; severe distal vertebral atherosclerosis- distal right occluded, distal left w/ high grade stenosis; mod to severe bilat PCA stenoses; chr cerebellar infarcts, no acute infarcts. ~  11/14: on ASA81 & Plavix75; known severe extracranial cerebrovasc dis w/ left CAE 2000 by DrLawson & known severe bilat cavernous ICA stenoses; Peter Nicholson is also s/p vertebral art angioplasty x2 by DrTDeveshwar w/ known basilar art occlusion ~  8/15: on ASA81 & Plavix75; known severe extracranial cerebrovasc dis w/ left CAE 2000 by DrLawson & known severe bilat cavernous ICA stenoses; Peter Nicholson is also s/p vertebral art angioplasty x2 by DrTDeveshwar w/ known basilar art occlusion, Peter Nicholson endorses medication compliance & denies cerebral ischemic symptoms... ~  11/15: Hosp by [facility] when Peter Nicholson presented w/ dysarthria; hx severe vasc dis w/ known severe extracranial cerebrovasc dis w/ [facility] bilat cavernous ICA stenoses; Peter Nicholson is also s/p vertebral art  angioplasty x2 by DrTDeveshwar w/ known basilar art occlusion; Peter Nicholson was taking meds on his own & not monitored by family- Peter Nicholson had stopped his Plavix (confused, didn't remember), ?DM meds & Simva; Hosp eval included CT Head & MRI/MRA Brain showing old cerebellar & thalamic infarcts and small vessel dis, acute sm nonhemorrhaghic infarcts throughout the left frontal/ temporal/ & parietal areas; Left ICA is occluded (reconstitution of flow in supraclinoid region appears inadeq), mod stenosis of Right ICA cavernous segment; occluded right vertebral and right PICA; high grade stenosis of Left vertebral art & mod narrowing of prox basilar art- see full report!  Subseq CT Angio Head & Neck showed prev Left CAE markedly attenuated & occluded at skullbase/ petrous segment of the left ICA, diminutive right vertebral that occludes at the skullbase, dominant left vertebral w/ hi-grade stenosis distally, diffuse atherosclerotic changes... 2DEcho was neg  for embolic source... Peter Nicholson was placed back on ASA/Plavix & no intervention recommended, reminded about need for strict regulation of BP, Chol, DM... Speech improved in [place] for outpt f/u w/ DrSethi (Peter Nicholson did not return)... ~  12/15: Hosp by [facility] w/ confusion, ataxia, & speech difficulty w/ BS found to be 666 (this is when it was discovered that Peter Nicholson was taking his own meds without supervision & several meds hadn't been filled in months); Peter Nicholson had an Enterococcus UTI- treated w/ Augmentin; repeat MRI/MRA Brain w/o change from Nov; Peter Nicholson was re-evaluated by [facility] for IR- cerebral angiography performed and attempted revasc of the left ICA was attempted but unsuccessful, Peter Nicholson was disch home on same meds (ASA/Plavix) w/ supervision of all meds from wife; Lisinopril was held & they rec keeping BP in the 130-160 range...  ~  5/16: Peter Nicholson has severe vascular dis & wife is concerned about memory loss & behavioral changes- continue ASA/ Plavix & she requests Neuro 2nd opinion... 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 by [facility] & known severe bilat cavernous ICA stenoses (occluded left); Peter Nicholson is also s/p vertebral art angioplasty x2 by Azell Der w/ known basilar art occlusion & severe intracranial atherosclerotic dis; Scans 11/15 revealed acute watershed infarcts in left frontal/ temporal/ parietal lobes & old infarcts in cerebellum & thalamus; DrTDeveshwar attempted intervention to left carotid system but this was reported as unsuccessful (unsucceeful attempt at revascularization of occluded LICA cavernous segment w/ multiple collaterals noted);  Last saw DrTDeveshwar 10/2014- note reviewed & 7/16 MRI/MRA & catheter angiogram reviewed=> tight focal stenosis of the right internal carotid artery distal cavernous segment, and the left vertebrobasilar junction suggestive of interval worsening. However no parenchymal changes suggestive of intervening or acute ischemic changes were noted on the DWI images- therefore they opted for continued medical management. ~  5/17: Peter Nicholson remains on ASA81 + Plavix75 => clinically stable w/ his severe cerebrovasc dis... ~  10/2015:  Peter Nicholson was HOSP at [facility] w/ lacunar infarct in left caudate nucleus => spont improvement, no change in meds, referred back to Neurology- DrAquino...  VENOUS INSUFFICIENCY (ICD-459.81) - Peter Nicholson knows to avoid sodium, elevate legs, wear support hose...  HYPERCHOLESTEROLEMIA (ICD-272.0) - on CRESTOR 54m/d since Hosp 12/12... ~  FHepzibah6/08 showed TChol 153, TG 120, HDL 36, LDL 93 ~  FLP 8/09 showed TChol 131, TG 84, HDL 38, LDL 76 ~  2010:  pt was not fasting for blood work... rec to continue Crestor10 + diet efforts. ~  FLP 10/11 showed TChol 210, TG 161, HDL 40, LDL 149... rec to start Crestor 221md, Peter Nicholson will decide. ~  FLP 6/12 on Cres20 showed TChol 106, TG 95, HDL 41, LDL 46... Continue same. ~  FLL[place]2/[place] showed TChol 100, TG 101, HDL 37, LDL  43... They decr to Cres10. ~  FLP 3/13 on Cres10 showed TChol 118, TG 67, HDL 44, LDL 61... Continue same, later switched to Simva40 for $$ reasons. ~  FLP 10/14 on Simva40 showed TChol 136, TG 54, HDL 49, LDL 77... Continue same. ~  FLP 12/15 on Simva40 w/ ?compliance showed TChol 175, TG 105, HDL 41, LDL 113 => [place]/ supervision of all meds by wife. ~  FLP 5/16 on Atorva80 showed TChol 113, TG 51, HDL 48, LDL 54 => much improved, continue the same.  DIABETES MELLITUS (ICD-250.00) >>  ~  on METFORMIN 523m- 2Bid, GLYBURIDE 569mid, ACTOS 3021m, & ONGLYZA 5mg65m.. ~  labs 6/08 showed BS= 155, HgA1c= 8.1... (Marland Kitchenlyburide incr to 5Bid). ~  labs 11/08 showed BS= 352, HgA1c= 8.1.... (Actos added). ~  labs 8/09 showed BS= 128, HgA1c= 7.1... rMarland KitchenMarland Kitchen- same meds, better diet, get wt down! ~  labs 2/10 showed BS= 249, A1c= 7.3.... Metform incr to 2Bid. ~  labs 12/10 (nonfasting- on all 3 meds) showed BS= 87, A1c= 6.9 ~  labs 10/11 on Metform1000Bid+Gybur5Bid showed BS= 180, A1c= 8.4... rMarland KitchenMarland Kitchen to incr GLYBUR10Bid + diet. ~  labs 6/12 on Metform1000Bid+Glybur10Bid showed BS= 196, A1c= 11.2 ?what happened?> Peter Nicholson declined insulin & Endocrine consult, opted for max oral meds by adding ACTOS 30mg62m ONGLYZA 5mg/d91m ~  [place] [facility] [place] 150-200+ and A1c= 7.2; covered w/ SSI in hosp & placed back on 4 oral meds on disch from rehab... ~  Labs 1/13 on all 4 meds showed BS= 230, A1c=7.1; we reviewed diet & Peter Nicholson will start PT soon... ~  Labs 3/13 on 4meds 24mwed BS= 72, A1c= 6.6 ~  4/13: pt called w/ hypoglycemia & we decreased Glyburide 5mg fro89mBid to 1Bid... ~  6/13:  Pt on Metform500-2Bid, Glybur5mgBid, 48mos30, Onglyza5; BS at home 70-90 Peter Nicholson says & still drinking Boost; Labs showed BS=190  A1c=7.4  ; we decided to cut the Glyburide5mg Qam o44m but keep the Metform500-2Bid +Actos +Onglyza... ~  10/13: on his 4meds> BS=71m, A1c=7.7 but Peter Nicholson's gained 16#; told to STOP the boost, get on diet, get wt  down, take all meds every day... ~  10/14: seen by TP> Labs 10/14 showed BS=252, A1c=9.1 & Lantus added to his Metform500-2Bid, Diabeta5, Onglyza5; Lantus10u added... ~  11/14: on Lantus10u, Metform500-2Bid, Diabeta5, Onglyza5, off Actos; f/u labs 11/14 showed BS=182, A1c=8.3; continue same, get wt down. ~  8/15: on Lantus10u, Metform500-2Bid, Diabeta5, Onglyza5; Labs 8/15 showed BS=167, A1c=7.3... ContinueMarland KitchenMarland Kitcheneds and get wt down. ~  12/15: now on Lantus15u now, Metform500-2Bid, Diabeta5AM, Onglyza5PM; compliance w/ meds was poor when taking his own meds; Labs 12/15 showed BS=146-665, A1c=9.3 => now all supervised by wife. ~  2/16: on Lantus15u now, Metform500-2Bid, Diabeta5AM, Onglyza5PM; Labs 2/16 showed BS=96, A1c=7.6, therefore continue same meds & diet... ~  5/16: on Lantus15u, Metform500-2Bid, Diabeta5AM, Onglyza5PM; compliance w/ meds was poor but wife supervises now; Labs 5/16 shows BS=130, A1c=6.8 => much improved, continue same. ~  11/16: on Lantus15u, Metform500-2Bid, Diabeta5AM, Onglyza5PM; wife supervises meds; Labs 11/16 showed BS=226, A1c=6.7, continue same meds, better diet. ~  5/17: on same meds & doing satis;  Labs 5/17 showed random BS=142, A1c wasn't done as requested...  OVERWEIGHT (ICD-278.02) - Peter Nicholson states that Peter Nicholson is not on much of a diet & we reviewed low carb/ no sweets/ low fat diet. ~  weight 8/09 = 216# ~  weight 2/10 = 216# ~  weight 12/10 = 212# ~  weight 10/11 = 210# ~  Weight 6/12 = 206# and Peter Nicholson admits to not being on much of a diet... ~  Weight 2/13 = non weight bearing still... ~  Weight 3/13 = 193# ~  Weight 6/13 = 191# ~  Weight 10/13 = 206# ~  Weight 11/14 = 198# ~  Weight 2/15 = 193# ~  Weight 1/16 = 181# ~  Weight 5/16 = 189# ~  Weight 11/16 = 189# ~  Weight 5/17 = 173# good job w/ wt reduction, diet, exercise...  GERD (ICD-530.81) - prev on Nexium40, Peter Nicholson switched to OTC  Prilosec, now Alfarata 38m/d due to Plavix Rx; pt had stopped this on his own & asked  to restart 10/13. ~  EGD 19/03 showed 4Katy GERD & esoph stricture dilated... ~  EGD 12/09 by DrPatterson showed HH & severe erosive esophagitis from GERD... ~  10/13: presents w/ incr reflux- w/ dysphagia & food sticking in mid-esoph that occurs every 2-3days & has to vomit to feel better; Peter Nicholson had stopped his PPI on his own (?why?), rec to restart the Protonix40 & we will refer to GI for EGD & dilatation... ~  10/13: Peter Nicholson saw DrPatterson> EGD 11/13 showed HH, chr GERD, stricture dil w/ 45F Maloney dilator, erosive gastritis was HPylori neg & treated w/ daily PPI rx...  ~  Peter Nicholson remains on Protonix40/d & doing satis w/o reflux symptoms...  DIVERTICULOSIS OF COLON (ICD-562.10) - note: [place]; prev on [place] recently noted loose stools on the Metformin 10012mid... ~  colonoscopy 10/03 by DrPatterson showed divertics only... ~  colonoscopy 12/09 by DrPatterson was also normal x for divertics... ~  Peter Nicholson denies n/v, c/d, blood seen...  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 Hx of RENAL CELL CANCER (ICD-189.0) - Peter Nicholson had a partial nephrectomy of the right side of his horseshoe kidney in 1994 due to renal cell ca... still followed by DrNew Athensearly by his hx but we don't have any recent notes... ~  labs 10/11 showed PSA= 0.95 ~  Labs 3/13 showed BUN=21, Creat=1.1, PSA=2.75 ~  Labs 10/13 showed BUN= 25, Creat= 1.2 ~  Labs 10/14 showed BUN=38, Cr=1.5 (w/ enterococcus UTI);  Labs 11/14 showed BUN=29, Cr=1.2 ~  Labs 8/15 showed BUN= 24, Cr= 1.2 ~  Labs 12/15 showed Cr= 1.1-1.4 ~  Labs 2/16 showed BUN= 18, Cr= 1.12  Hx of NEPHROLITHIASIS (ICD-592.0) - remote hx of kidney stones followed by DrPeterson...  DEGENERATIVE JOINT DISEASE (ICD-715.90) &  GOUT (ICD-274.9) - on ALLOPURINOL 3003m... ~  labs 10/11 showed URIC= 4.7 ~  [facility] after fall from ladder cleaning gutters w/  complex right tibial plat fx- ORIF, 2 operations by DrHardy, & hosp course complic by ileus, TIA, electrolyte abn, etc;  Event disch to Rehab & [facility]  BACK PAIN, LUMBAR (ICD-724.2) - known degenerative spondylitic disease at L4-5 and L5-S1 prev [facility] (now DrElsner & DrHarkins)... ~  6/12: Peter Nicholson reports further back pain w/ eval by Chiropractor showing L4 disc prob & regular adjustments; but now notes incr pain & Peter Nicholson requests referral to Neurosurg for further eval ==> seen by DrElsner, then DrHarkins for shots which has helped... ~  2014: Peter Nicholson has continued f/u w/ DrHarkins/ [facility] & received periodic ESI injections for his Lumbar DDD; they added NORTRIPTYLINE 70m46m & slowly incr the dose  NEURO >> Hx Left Hemispheric TIA in 11/12 & MEMORY IMPAIRMENT ~  3/14:  Peter Nicholson had Neuro eval DrSethi> Peter Nicholson did Labs, EEG, MRI Brain, & MRA Brain & Neck; Rec to take 2 FishOil caps daily, CerefolinNAC daily, work puzzles etc, continue Plavix & strict control of BP, Chol, DM; they did not start Aricept... ~  11/15 & 12/15> see above> Hosp w/ wastershed strokes left frontal/ temporal/ parietal areas when Peter Nicholson wasn't taking his Plavix; now all meds supervised by wife; Peter Nicholson had MRIs, CT Angio, and Arteriograms w/ attempted intervention on left by DrTDeveshwar- unsuccessful & rec  for continued ASA/ Plavix, & control on BP, Chol, DM w/ BP in the 130-160 range... 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 ~  6/16: Eval by [facility], c/w mild cognitive impairment w/ MOCA=20/30, started on Aricept & B12 supplement... ~  4/17: f/u DrAquino 08/02/15- reviewed, tolerating Aricept, stable & independent w/ ADLs, drives a little, GHWE=99/37, no ch in meds.   ANXIETY (ICD-300.00) - uses Zolloft50 & ALPRAZOLAM 0.74m as needed.  B12 DEFICIENCY >>  ~  5/16:  Labs showed B12 = 196 & rec to  start 10069m by mouth daily & we will f/u labs... ~  11/16: f/u labs showed B12 = 426, continue B12 supplement daily... ~  Labs 5/17:  CBC showed Hg= 11.1, MCV=94, and rec to add 1-a-day w/ Fe to the B12 daily supplement.   Past Surgical History:  Procedure Laterality Date  . CAROTID ENDARTERECTOMY  09/2000   Dr.Lawson  . CHOLECYSTECTOMY  9/04   Dr. LeRebekah Chesterfield. EXTERNAL FIXATION LEG  03/10/2011   Procedure: EXTERNAL FIXATION LEG;  Surgeon: MiRozanna Box [place];  Laterality: Right;  . INCISIONAL HERNIA REPAIR  1/96   Dr.Leone  . NASAL SINUS SURGERY  12/96   Dr.Redman  . 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 ORIF TIBIA PLATEAU  03/24/2011   Procedure: OPEN REDUCTION INTERNAL FIXATION (ORIF) TIBIAL PLATEAU;  Surgeon: MiRozanna Box [place] [facility] Service: [place]ics;  Laterality: Right;  . RADIOLOGY WITH ANESTHESIA N/A 04/12/2014   Procedure: ANGIOPLASTY;  Surgeon: SaRob HickmanMD;  [place] [facility] Service: Radiology;  Laterality: N/A;  . SMALL INTESTINE SURGERY    . veterbral art angioplasty     x2    Outpatient Encounter Prescriptions as of 06/08/2016  Medication Sig Dispense Refill  . ALPRAZolam (XANAX) 0.5 MG tablet Take 1/2 to 1 tablet by mouth three times daily as needed for anxiety 90 tablet 5  . aspirin EC 81 MG EC tablet Take 1 tablet (81 mg total) by mouth daily. 30 tablet 0  . atorvastatin (LIPITOR) 80 MG tablet TAKE 1 TABLET BY MOUTH EVERY DAY AT 6PM 90 tablet 3  . clopidogrel (PLAVIX) 75 MG tablet TAKE 1 TABLET BY MOUTH EVERY DAY 90 tablet 0  . donepezil (ARICEPT) 10 MG tablet Take 1 tablet (10 mg total) by mouth daily. 90 tablet 3  . glipiZIDE (GLUCOTROL XL) 5 MG 24 hr tablet TAKE 1 TABLET (5 MG TOTAL) BY MOUTH DAILY WITH BREAKFAST. 30 tablet 0  . LANTUS SOLOSTAR 100 UNIT/ML Solostar Pen INJECT 10 UNITS INTO THE SKIN AT BEDTIME. (Patient taking differently: INJECT 15 UNITS INTO THE SKIN AT  BEDTIME.) 15 pen 0  . losartan (COZAAR) 50 MG tablet TAKE 1 TABLET (50 MG TOTAL) BY MOUTH DAILY. 90 tablet 1  . metFORMIN (GLUCOPHAGE) 500 MG tablet TAKE 2 TABLETS TWICE A DAY 360 tablet 2  . pantoprazole (PROTONIX) 40 MG tablet TAKE 1 TABLET (40 MG TOTAL) BY MOUTH DAILY. 90 tablet 3  . saxagliptin HCl (ONGLYZA) 5 MG TABS tablet Take 1 tablet (5 mg total) by mouth daily. 90 tablet 3  . sertraline (ZOLOFT) 50 MG tablet TAKE 1 TABLET (50 MG TOTAL) BY MOUTH AT BEDTIME. 90 tablet 2   No facility-administered encounter medications on file as of 06/08/2016.     Allergies  Allergen Reactions  . Dilaudid [Hydromorphone Hcl] Other (See Comments)    hallucinations  . Fentanyl Other (See Comments)    hallucinations    Current Medications, Allergies, Past Medical History, Past Surgical History, Family History, and Social History were reviewed in [employer] record.    Review of Systems        See HPI - all other systems neg except as noted...  The patient complains of cyst on back & dyspnea on exertion.  The patient denies anorexia, fever, weight loss, weight gain, vision loss, decreased hearing, hoarseness, chest pain, syncope, peripheral edema, prolonged cough, headaches, hemoptysis, abdominal pain, melena, hematochezia, severe indigestion/heartburn, hematuria, incontinence, muscle weakness, suspicious skin lesions, transient blindness, difficulty walking, depression, unusual weight change, abnormal bleeding, enlarged lymph nodes, and angioedema.     Objective:   Physical Exam     WD, WN, 76 y/o Peter Nicholson in NAD...  GENERAL:  Alert & oriented; pleasant & cooperative... HEENT:  [place]/AT, EOM-wnl, PERRLA, EACs-clear, TMs-wnl, NOSE-clear, THROAT-clear & wnl. NECK:  Supple w/ fairROM; no JVD; s/p left CAE, +bruits; no thyromegaly or nodules palpated; no lymphadenopathy. CHEST:  Clear to P & A; without wheezes/ rales/ or rhonchi heard... HEART:  Regular Rhythm; gr 1/6 SEM without rubs  or gallops detected... ABDOMEN:  Soft & nontender; normal bowel sounds; no organomegaly or masses palpated... EXT: right knee deformity s/p ORIF, mod arthritic changes; no varicose veins/ +venous insuffic/ 1+ edema. NEURO:  CN's intact; no focal neuro deficits... DERM:  Quarter sized sebaceous cyst in mid back.   RADIOLOGY DATA:  Reviewed in the EPIC EMR & discussed w/ the patient...  LABORATORY DATA:  Reviewed in the EPIC EMR & discussed w/ the patient...   Assessment & Plan:    BP>  Neuro has wanted his BP to be sl higher than usual for perfusion reasons but BP= 170/80 on arrival & improved to 150/80 w/ rest; we opted to start Losartan 81m/d for BP & vasc dis... 5/16> BP= 144/76 today, continue Losar50... 9/16> BP= 120/60 & Peter Nicholson remains asymptomatic... ~  Subsequent ROVs w/ stable BP on Losartan50 + diet, wt reduction...  CEREBROVASC Disease & STROKES 7/17, 11/15 & TIA 12/12>  Known severe cerebrovasc dis & Peter Nicholson had TIA while in hosp for fx leg 2012 (Peter Nicholson was off plavix at the time); Recurrent prob 11/15 w/ watershed infarcts in left frontal/ temporal/ parietal regions while Peter Nicholson was off Plavix (Peter Nicholson was taking meds on his own & hadn't been filling the Plavix); s/p extensive eval w/ MRI/MRA, CT Angio & Arteriogram by DLadene Artistw/ attempt at intervention on left unsuccessful; wife now supervises all meds... Peter Nicholson has prob multi-infarct dementia & wife requests Neuro 2nd opinion => [facility]>   JLexxhas had a relatively quiet/ stable interval & is doing satis on his current regimen;  On ASA/ Plavix & Losar50 w/ adeq BP control x "white coat" (reminded no salt);  DM treated w/ Lantus 15u + 3 meds & labs are reasonably well maintained;  Continue same meds, PLEASE incr phys activity & mental exercises, rov 640mo NEURO> Mild Cognitive Impairment per DrSethi & wife is concerned re: memory/ vascular dementia; we will arrange for Neuro 2nd opinion=> started on Aricept &  stable...  CHOL>  on Atrova80 now & f/u FLP much improved...  DM>  On Lantus15, Metform, Diabeta, Onglyza & compliance is better w/ wife supervision; Labs stable w/ A1c in the 6's...  Overweight>  We discussed low  carb, low fat wt reducing diet;  Nice job w/ wt down to 173# 5/17 OV...  GI>  GERD prev controlled w/ Protonix but Peter Nicholson stopped on his own (?why?); EGD 11/13 w/ dilatation & reminded to take the Protonix40 every day...  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  DJD/ Hx Gout/ LBP>  Peter Nicholson was seeing chiropractor regularly regarding LBP & then f/u w/ DrElsner,Neurosurg & had ESI by DrHarkins & the shots have helped...  s/p fall w/ right Tibial plateaux fx> 1st ext fixation, 2nd ORIF>  By DrHardy, Ortho; exercise is difficult but very much needed; Peter Nicholson's had extensive PT training...  Anxiety>  Peter Nicholson remains on Alpraz Prn... Also has [place] on his list of meds...  B12 deficiency>  Labs 5/16 w/ B12 level = 196; rec to start oral B12 supplement ~1023mg/d & repeat B12 level 11/16= 426   Patient's Medications  New Prescriptions   No medications on file  Previous Medications   ALPRAZOLAM (XANAX) 0.5 MG TABLET    Take 1/2 to 1 tablet by mouth three times daily as needed for anxiety   ASPIRIN EC 81 MG EC TABLET    Take 1 tablet (81 mg total) by mouth daily.   ATORVASTATIN (LIPITOR) 80 MG TABLET    TAKE 1 TABLET BY MOUTH EVERY DAY AT 6PM   CLOPIDOGREL (PLAVIX) 75 MG TABLET    TAKE 1 TABLET BY MOUTH EVERY DAY   DONEPEZIL (ARICEPT) 10 MG TABLET    Take 1 tablet (10 mg total) by mouth daily.   GLIPIZIDE (GLUCOTROL XL) 5 MG 24 HR TABLET    TAKE 1 TABLET (5 MG TOTAL) BY MOUTH DAILY WITH BREAKFAST.   LANTUS SOLOSTAR 100 UNIT/ML SOLOSTAR PEN    INJECT 10 UNITS INTO THE SKIN AT BEDTIME.   LOSARTAN (COZAAR) 50 MG TABLET    TAKE 1 TABLET (50 MG TOTAL) BY MOUTH DAILY.   METFORMIN (GLUCOPHAGE) 500 MG TABLET    TAKE 2 TABLETS TWICE A DAY    PANTOPRAZOLE (PROTONIX) 40 MG TABLET    TAKE 1 TABLET (40 MG TOTAL) BY MOUTH DAILY.   SAXAGLIPTIN HCL (ONGLYZA) 5 MG TABS TABLET    Take 1 tablet (5 mg total) by mouth daily.   SERTRALINE (ZOLOFT) 50 MG TABLET    TAKE 1 TABLET (50 MG TOTAL) BY MOUTH AT BEDTIME.  Modified Medications   No medications on file  Discontinued Medications   No medications on file

## 2016-06-08 NOTE — Patient Instructions (Signed)
Today we updated your med list in our EPIC system...    Continue your current medications the same...  Today we checked your f/u FASTING blood work...    We will contact you w/ the results when available...   We discussed the combination of >>    PHYSICAL Exercise- silver sneakers, join the Y, do it in your home gym, etc...    MENTAL Exercise- crosswords, [place], puzzles, reading, etc...  We gave you the 2017 flu shot today...  Call for any questions...  Let's plan a follow up visit in 56mo, sooner if needed for problems.Marland KitchenMarland Kitchen

## 2016-06-11 ENCOUNTER — Telehealth: Payer: Self-pay | Admitting: Pulmonary Disease

## 2016-06-11 NOTE — Telephone Encounter (Signed)
Notes Recorded by Noralee Space, MD on 06/09/2016 at 4:04 PM EST Please notify patient> LABS look OK... Continue same meds, take all meds regularly, +better diet FLP at goals on Lip80- continue same... Chems- ok x BS=181 from his DM- on Lantus15 + 3 meds- needs to take meds regularly & follow low carb/ no seets diet! CBC shows mild chr anemia w/ Hg ~11; B12 is WNL.Marland KitchenMarland Kitchen Thyroid is wnl; PSA is also WNL; Vit D is ok at low end of norm range... ----------------------  Spoke with pt's wife (dpr on file), aware of results/recs.  Nothing further needed.

## 2016-06-11 NOTE — Progress Notes (Signed)
Left message to call office for medical results.

## 2016-06-30 ENCOUNTER — Other Ambulatory Visit: Payer: Self-pay | Admitting: Pulmonary Disease

## 2016-07-08 ENCOUNTER — Other Ambulatory Visit: Payer: Self-pay | Admitting: Pulmonary Disease

## 2016-07-20 ENCOUNTER — Telehealth: Payer: Self-pay | Admitting: Pulmonary Disease

## 2016-07-20 MED ORDER — GLUCOSE BLOOD VI STRP: ORAL_STRIP | 6 refills | Status: DC

## 2016-07-20 NOTE — Telephone Encounter (Signed)
Refill of test strips have been sent to the pharmacy for the pt.  Called and lmom to make the pt aware.

## 2016-08-03 ENCOUNTER — Ambulatory Visit: Payer: Medicare Other | Admitting: Neurology

## 2016-08-11 ENCOUNTER — Other Ambulatory Visit: Payer: Self-pay | Admitting: Pulmonary Disease

## 2016-09-05 ENCOUNTER — Other Ambulatory Visit: Payer: Self-pay | Admitting: Pulmonary Disease

## 2016-09-06 ENCOUNTER — Other Ambulatory Visit: Payer: Self-pay | Admitting: Pulmonary Disease

## 2016-09-19 ENCOUNTER — Other Ambulatory Visit: Payer: Self-pay | Admitting: Pulmonary Disease

## 2016-09-20 ENCOUNTER — Other Ambulatory Visit: Payer: Self-pay | Admitting: Pulmonary Disease

## 2016-09-21 NOTE — Telephone Encounter (Signed)
Please advise if ok to refill alprazolam 0.5 mg Last given # 90 tablets on 01/24/16 with 5 refills  Next ov here 12/07/16  Thanks

## 2016-10-02 IMAGING — MR MR MRA HEAD W/O CM
8 of 12 series · 29 of 48 positions shown · non-contrast
Comparison: 02/25/2014 CT.  07/21/2012 MR.

ADDENDUM:
These results were called by telephone at the time of interpretation
on 02/26/2014 at [DATE] to Dr. Moolman , who verbally acknowledged
these results.
CLINICAL DATA: 73-year-old diabetic hypertensive male presenting
with dysarthria. Subsequent encounter.

EXAM:
MRI HEAD WITHOUT CONTRAST
MRA HEAD WITHOUT CONTRAST
TECHNIQUE: Multiplanar, multiecho pulse sequences of the brain and surrounding
structures were obtained without intravenous contrast. Angiographic
images of the head were obtained using MRA technique without
contrast.

[Series 4: DWI · axial · 5.0mm · 1.09mm/px · z∈[-80,+65]mm · 5 of 60 slices shown (1 of 4)]
[im 1/60]
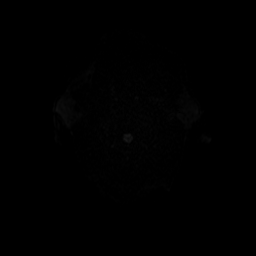
[im 15/60]
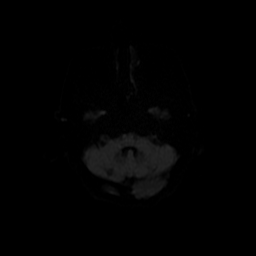
[im 30/60]
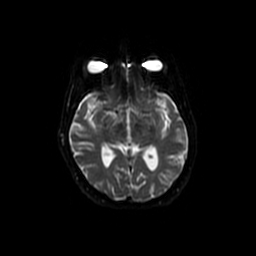
[im 45/60]
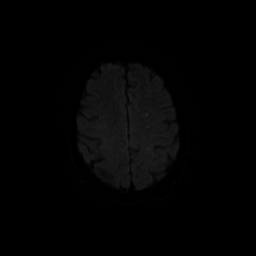
[im 60/60]
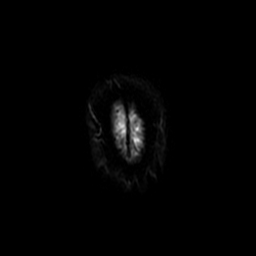

[Series 6: T2 · axial · 5.0mm · 0.43mm/px · z∈[-81,+62]mm · 3 of 25 slices shown (1 of 2)]
[im 1/25]
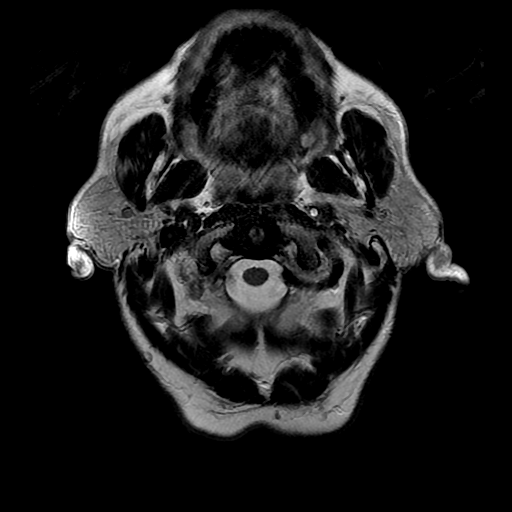
[im 13/25]
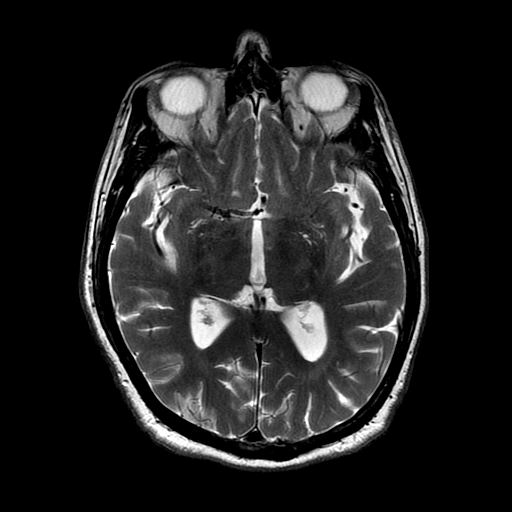
[im 25/25]
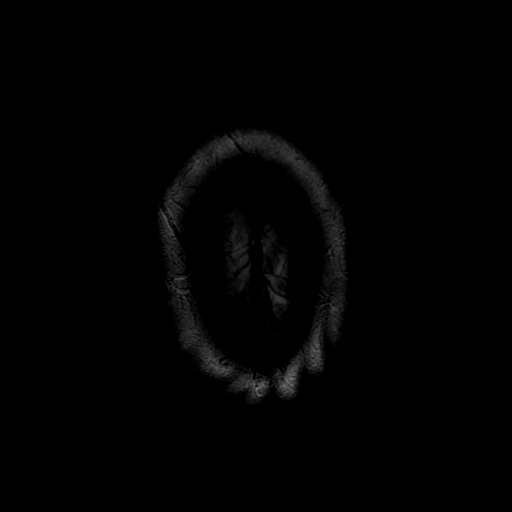

[Series 8: DWI · coronal · 5.0mm · 1.09mm/px · 7 of 66 slices shown (2 of 4)]
[im 1/66]
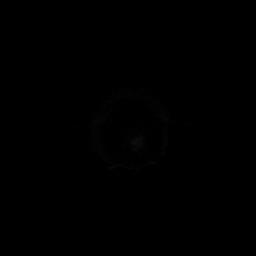
[im 11/66]
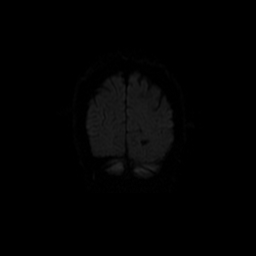
[im 22/66]
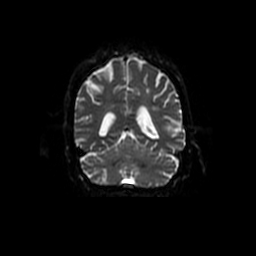
[im 33/66]
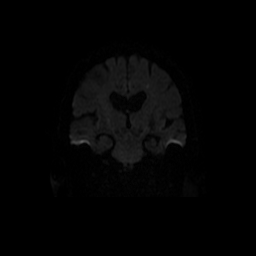
[im 44/66]
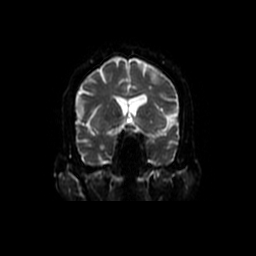
[im 55/66]
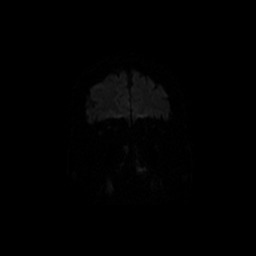
[im 66/66]
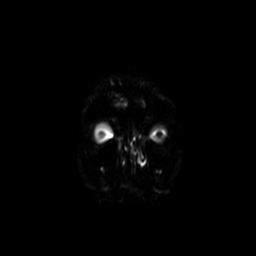

[Series 10: FLAIR · axial · 5.0mm · 0.43mm/px · z∈[-81,+62]mm · 3 of 25 slices shown (1 of 2)]
[im 1/25]
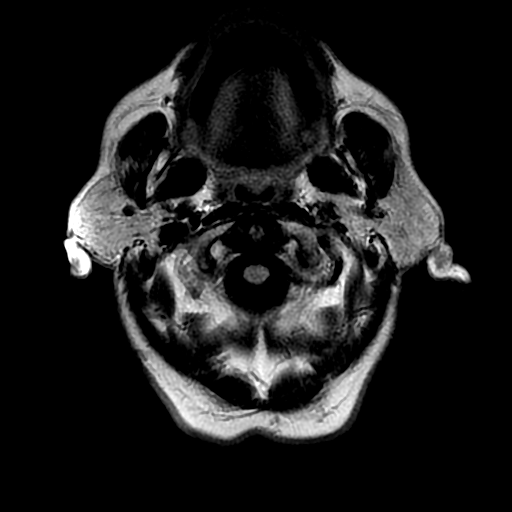
[im 13/25]
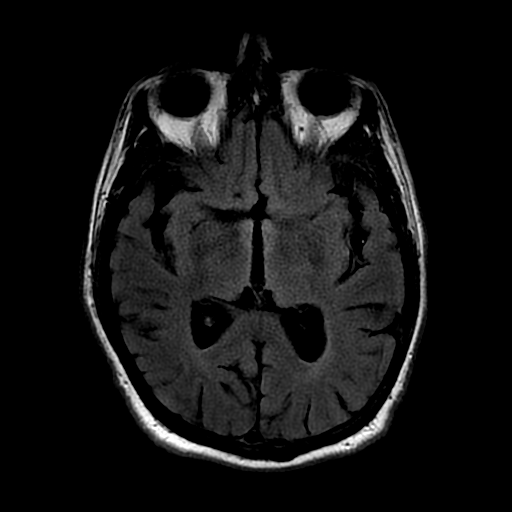
[im 25/25]
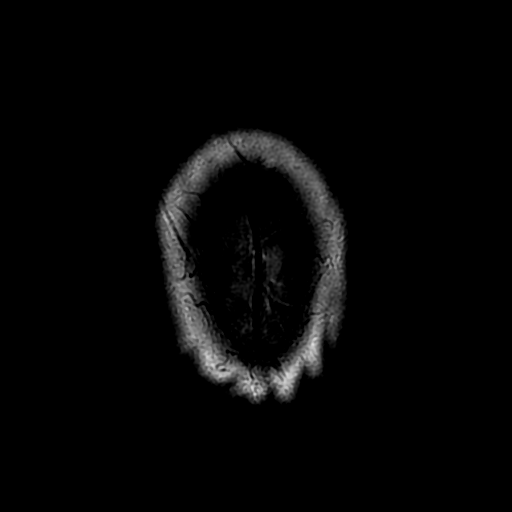

[Series 11: T2 · coronal · 5.0mm · 0.47mm/px · 2 of 26 slices shown (2 of 2)]
[im 1/26]
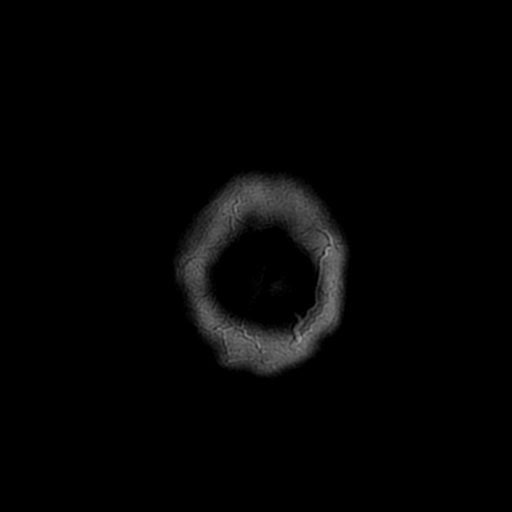
[im 13/26]
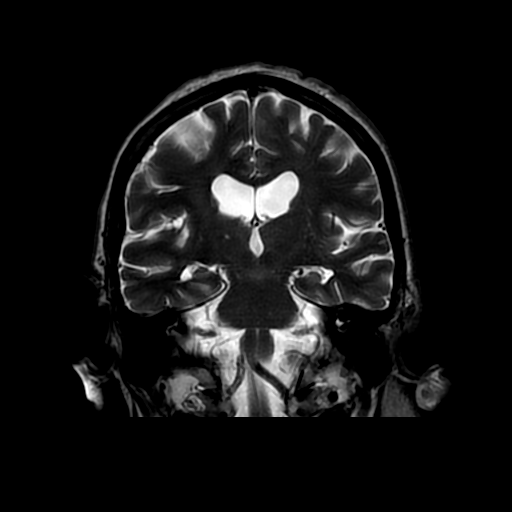

[Series 12: FLAIR · axial · 5.0mm · 0.43mm/px · z∈[-81,+62]mm · 3 of 25 slices shown (2 of 2)]
[im 1/25]
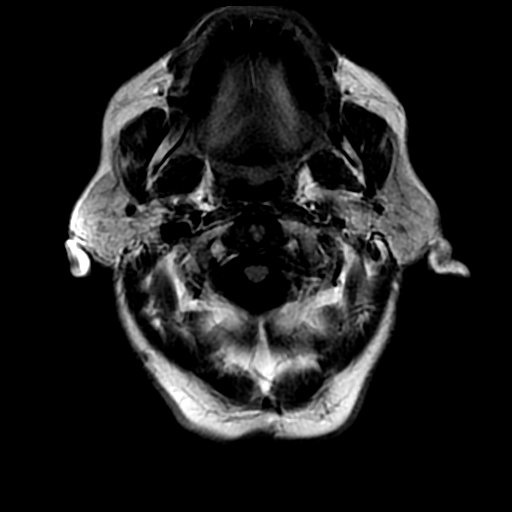
[im 13/25]
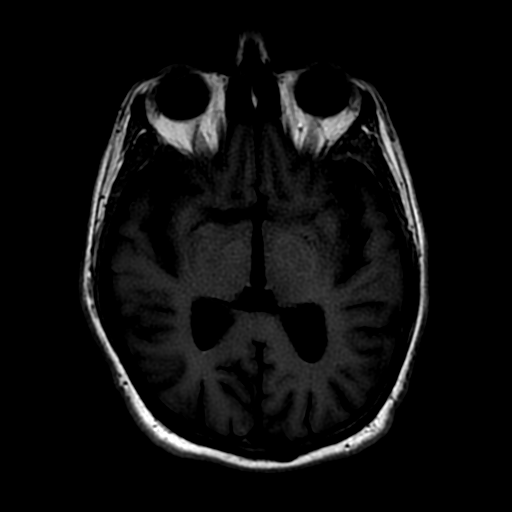
[im 25/25]
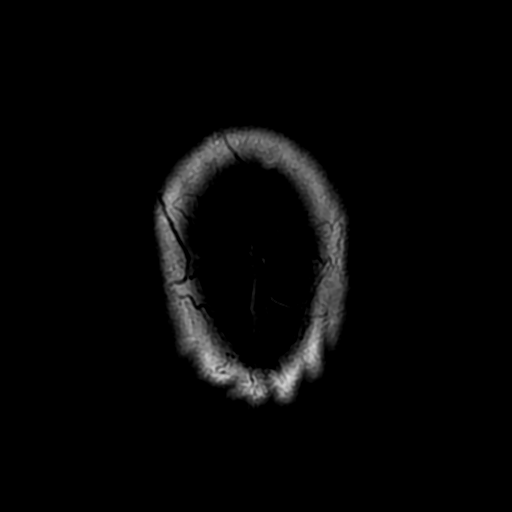

[Series 400: DWI · axial · 5.0mm · 1.09mm/px · z∈[-80,+65]mm · 3 of 30 slices shown (3 of 4)]
[im 1/30]
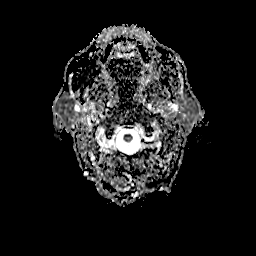
[im 15/30]
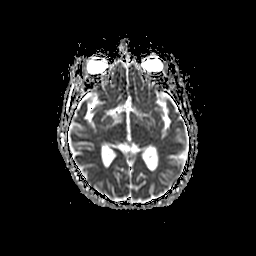
[im 30/30]
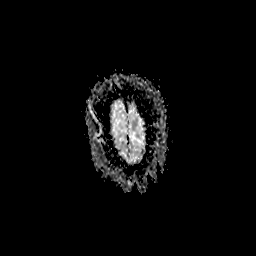

[Series 800: DWI · coronal · 5.0mm · 1.09mm/px · 3 of 33 slices shown (4 of 4)]
[im 1/33]
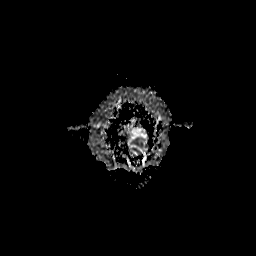
[im 17/33]
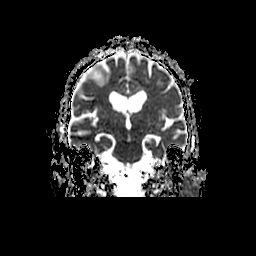
[im 33/33]
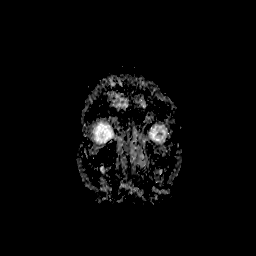

[29 of 48 positions shown; findings below may reference images not displayed]

FINDINGS: MRI HEAD FINDINGS

Acute small nonhemorrhagic infarcts spread throughout the left
frontal lobe (parasagittal distribution), left temporal lobe and at
the left temporal-parietal lobe junction (periatrial region).

Remote bilateral cerebellar infarcts more notable on the right.
Remote tiny right thalamic infarcts. Mild small vessel disease type
changes.

No hydrocephalus.

No intracranial hemorrhage.

No intracranial mass lesion noted on this unenhanced exam.

Mild cervical spondylotic changes upper cervical spine.

Abnormal appearance of the left internal carotid artery and right
vertebral artery. Please see below.

MRA HEAD FINDINGS

Exam is motion degraded.

Left internal carotid artery is occluded. Reconstitution of flow
left supraclinoid region appears inadequate as only a small amount
of flow seen at the left carotid terminus and portions of the left
middle cerebral artery. Findings new from the prior exam.

Moderate to marked stenosis right internal carotid artery cavernous
segment.

A2 segment of the anterior cerebral arteries supplied bilaterally
from the right.

Moderate narrowing proximal A1 segment left anterior cerebral
artery.

Vessel at the anterior communicating artery region appears to be
branch rather than aneurysm.

Occluded right vertebral artery and right posterior inferior
cerebral artery.

High-grade multiple stenosis of the distal left vertebral artery.

Moderate narrowing proximal basilar artery.

Moderate stenosis portions of the P1 segment of the posterior
cerebral artery more notable on the right. Nonvisualized distal
branches of the right posterior cerebral artery.
IMPRESSION: MRI HEAD

Acute small nonhemorrhagic infarcts spread throughout the left
frontal lobe (parasagittal distribution), left temporal lobe and at
the left temporal-parietal lobe junction (periatrial region).

Remote bilateral cerebellar infarcts more notable on the right.
Remote tiny right thalamic infarcts. Mild small vessel disease type
changes.

MRA HEAD

Left internal carotid artery is occluded. Reconstitution of flow
left supraclinoid region appears inadequate as only a small amount
of flow seen at the left carotid terminus and portions of the left
middle cerebral artery. Findings new from the prior exam.

Moderate to marked stenosis right internal carotid artery cavernous
segment.

A2 segment of the anterior cerebral arteries supplied bilaterally
from the right.

Moderate narrowing proximal A1 segment left anterior cerebral
artery.

Occluded right vertebral artery and right posterior inferior
cerebral artery. This is noted previously.

High-grade multiple stenosis of the distal left vertebral artery.
This is noted previously.

Moderate narrowing proximal basilar artery. This is noted
previously.

Moderate stenosis portions of the P1 segment of the posterior
cerebral artery more notable on the right. Nonvisualized distal
branches of the right posterior cerebral artery.

These results will be called to the ordering clinician or
representative by the Radiologist Assistant, and communication
documented in the PACS or zVision Dashboard.

## 2016-10-03 ENCOUNTER — Other Ambulatory Visit: Payer: Self-pay | Admitting: Pulmonary Disease

## 2016-10-04 ENCOUNTER — Other Ambulatory Visit: Payer: Self-pay | Admitting: Pulmonary Disease

## 2016-10-21 ENCOUNTER — Telehealth: Payer: Self-pay | Admitting: Pulmonary Disease

## 2016-10-21 NOTE — Telephone Encounter (Signed)
Spoke with patient wife, advised that once they get checked and decide which pharmacy to give [place] a call back. Pt is requesting we send Lantus and Onglyza to the pharmacy. Pt may have to pay for these out of pocket as insurance has already paid for this months supply which he left at home. Will await call back

## 2016-10-22 NOTE — Telephone Encounter (Signed)
ATC x1 to pt and pt's wife was unable to leave a vm

## 2016-10-23 MED ORDER — INSULIN PEN NEEDLE 31G X 8 MM MISC: 0 refills | Status: DC

## 2016-10-23 NOTE — Telephone Encounter (Signed)
I have sent in the needles for the pt to the pharmacy at [facility].  I have called and lmom to make the pts wife aware.

## 2016-10-23 NOTE — Telephone Encounter (Signed)
Wife returned phone call stating: do no need the Lantus pen, but need a prescription for the needles to be sent to [facility] [address], [place], [place]  Fax#  [phone] have not had any medication in 3 days...ert

## 2016-10-23 NOTE — Telephone Encounter (Signed)
lmomtcb x 2 for the pts wife.   

## 2016-11-03 DIAGNOSIS — E113393 Type 2 diabetes mellitus with moderate nonproliferative diabetic retinopathy without macular edema, bilateral: Secondary | ICD-10-CM | POA: Diagnosis not present

## 2016-11-03 DIAGNOSIS — H3562 Retinal hemorrhage, left eye: Secondary | ICD-10-CM | POA: Diagnosis not present

## 2016-11-03 DIAGNOSIS — H18413 Arcus senilis, bilateral: Secondary | ICD-10-CM | POA: Diagnosis not present

## 2016-11-03 DIAGNOSIS — H52223 Regular astigmatism, bilateral: Secondary | ICD-10-CM | POA: Diagnosis not present

## 2016-11-04 ENCOUNTER — Other Ambulatory Visit: Payer: Self-pay | Admitting: *Deleted

## 2016-11-04 MED ORDER — GLIPIZIDE ER 5 MG PO TB24: 5.0000 mg | ORAL_TABLET | Freq: Every day | ORAL | 1 refills | Status: DC

## 2016-11-19 ENCOUNTER — Encounter: Payer: Self-pay | Admitting: Pulmonary Disease

## 2016-11-19 DIAGNOSIS — H35033 Hypertensive retinopathy, bilateral: Secondary | ICD-10-CM | POA: Diagnosis not present

## 2016-11-19 DIAGNOSIS — E119 Type 2 diabetes mellitus without complications: Secondary | ICD-10-CM | POA: Diagnosis not present

## 2016-11-19 DIAGNOSIS — H348122 Central retinal vein occlusion, left eye, stable: Secondary | ICD-10-CM | POA: Diagnosis not present

## 2016-11-19 DIAGNOSIS — H43813 Vitreous degeneration, bilateral: Secondary | ICD-10-CM | POA: Diagnosis not present

## 2016-11-19 LAB — HM DIABETES EYE EXAM

## 2016-11-23 ENCOUNTER — Telehealth: Payer: Self-pay | Admitting: Pulmonary Disease

## 2016-11-23 NOTE — Telephone Encounter (Signed)
lmomtcb x 1 for the pts wife to find out exactly what it is she is needing for the pt.

## 2016-11-24 NOTE — Telephone Encounter (Signed)
lmomtcb x 2 for the pts wife to call back .

## 2016-11-25 ENCOUNTER — Telehealth: Payer: Self-pay | Admitting: Pulmonary Disease

## 2016-11-25 NOTE — Telephone Encounter (Signed)
lmomtcb x 3 for Peter Nicholson.  We have called pts wife x 3 with no returned calls.  Will sign off of this message.

## 2016-11-25 NOTE — Telephone Encounter (Signed)
Spoke with patient's wife. She was requesting a current copy of patient's medication list. During a previous call, she was calling for a refill on pt's Zoloft 50mg . She stated that she picked up the RX from the pharmacy yesterday.   Advised her that I will print out a copy of his medication list and mail it to them. Verified address on the phone. Nothing else was needed at time of call.

## 2016-12-04 ENCOUNTER — Other Ambulatory Visit: Payer: Self-pay | Admitting: Pulmonary Disease

## 2016-12-05 ENCOUNTER — Other Ambulatory Visit: Payer: Self-pay | Admitting: Pulmonary Disease

## 2016-12-07 ENCOUNTER — Other Ambulatory Visit (INDEPENDENT_AMBULATORY_CARE_PROVIDER_SITE_OTHER): Payer: Medicare Other

## 2016-12-07 ENCOUNTER — Encounter: Payer: Self-pay | Admitting: Pulmonary Disease

## 2016-12-07 ENCOUNTER — Ambulatory Visit (INDEPENDENT_AMBULATORY_CARE_PROVIDER_SITE_OTHER): Payer: Medicare Other | Admitting: Pulmonary Disease

## 2016-12-07 ENCOUNTER — Ambulatory Visit: Payer: Medicare Other

## 2016-12-07 VITALS — BP 140/70 | HR 67 | Temp 98.0°F | Ht 66.0 in | Wt 174.0 lb

## 2016-12-07 DIAGNOSIS — I10 Essential (primary) hypertension: Secondary | ICD-10-CM

## 2016-12-07 DIAGNOSIS — Q631 Lobulated, fused and horseshoe kidney: Secondary | ICD-10-CM

## 2016-12-07 DIAGNOSIS — E7800 Pure hypercholesterolemia, unspecified: Secondary | ICD-10-CM

## 2016-12-07 DIAGNOSIS — Z794 Long term (current) use of insulin: Secondary | ICD-10-CM | POA: Diagnosis not present

## 2016-12-07 DIAGNOSIS — I679 Cerebrovascular disease, unspecified: Secondary | ICD-10-CM

## 2016-12-07 DIAGNOSIS — D638 Anemia in other chronic diseases classified elsewhere: Secondary | ICD-10-CM

## 2016-12-07 DIAGNOSIS — E1165 Type 2 diabetes mellitus with hyperglycemia: Secondary | ICD-10-CM

## 2016-12-07 DIAGNOSIS — R413 Other amnesia: Secondary | ICD-10-CM

## 2016-12-07 DIAGNOSIS — E118 Type 2 diabetes mellitus with unspecified complications: Secondary | ICD-10-CM

## 2016-12-07 DIAGNOSIS — C641 Malignant neoplasm of right kidney, except renal pelvis: Secondary | ICD-10-CM | POA: Diagnosis not present

## 2016-12-07 DIAGNOSIS — I63032 Cerebral infarction due to thrombosis of left carotid artery: Secondary | ICD-10-CM | POA: Diagnosis not present

## 2016-12-07 DIAGNOSIS — I6523 Occlusion and stenosis of bilateral carotid arteries: Secondary | ICD-10-CM | POA: Diagnosis not present

## 2016-12-07 DIAGNOSIS — Z23 Encounter for immunization: Secondary | ICD-10-CM

## 2016-12-07 DIAGNOSIS — E538 Deficiency of other specified B group vitamins: Secondary | ICD-10-CM

## 2016-12-07 DIAGNOSIS — IMO0002 Reserved for concepts with insufficient information to code with codable children: Secondary | ICD-10-CM

## 2016-12-07 DIAGNOSIS — N289 Disorder of kidney and ureter, unspecified: Secondary | ICD-10-CM | POA: Diagnosis not present

## 2016-12-07 LAB — CBC WITH DIFFERENTIAL/PLATELET
BASOS PCT: 1.2 % (ref 0.0–3.0)
Basophils Absolute: 0.1 10*3/uL (ref 0.0–0.1)
EOS ABS: 0.4 10*3/uL (ref 0.0–0.7)
Eosinophils Relative: 5.9 % — ABNORMAL HIGH (ref 0.0–5.0)
HCT: 32.9 % — ABNORMAL LOW (ref 39.0–52.0)
Hemoglobin: 10.8 g/dL — ABNORMAL LOW (ref 13.0–17.0)
Lymphocytes Relative: 13 % (ref 12.0–46.0)
Lymphs Abs: 0.9 10*3/uL (ref 0.7–4.0)
MCHC: 32.9 g/dL (ref 30.0–36.0)
MCV: 99.2 fl (ref 78.0–100.0)
MONO ABS: 0.6 10*3/uL (ref 0.1–1.0)
Monocytes Relative: 9.4 % (ref 3.0–12.0)
NEUTROS ABS: 4.6 10*3/uL (ref 1.4–7.7)
NEUTROS PCT: 70.5 % (ref 43.0–77.0)
PLATELETS: 223 10*3/uL (ref 150.0–400.0)
RBC: 3.32 Mil/uL — ABNORMAL LOW (ref 4.22–5.81)
RDW: 12.1 % (ref 11.5–15.5)
WBC: 6.6 10*3/uL (ref 4.0–10.5)

## 2016-12-07 LAB — BASIC METABOLIC PANEL
BUN: 34 mg/dL — ABNORMAL HIGH (ref 6–23)
CALCIUM: 9.7 mg/dL (ref 8.4–10.5)
CO2: 29 meq/L (ref 19–32)
CREATININE: 1.42 mg/dL (ref 0.40–1.50)
Chloride: 109 mEq/L (ref 96–112)
GFR: 51.37 mL/min — AB (ref >60.00)
GLUCOSE: 91 mg/dL (ref 70–99)
Potassium: 4.5 mEq/L (ref 3.5–5.1)
Sodium: 142 mEq/L (ref 135–145)

## 2016-12-07 LAB — HEMOGLOBIN A1C: Hgb A1c MFr Bld: 6.4 % (ref 4.6–6.5)

## 2016-12-07 NOTE — Patient Instructions (Signed)
Today we updated your med list in our EPIC system...    Continue your current medications the same...  Today we rechecked your blood work...    We will contact you w/ the results when available...   Again-- try to increase your physical activity (eg- silver sneakers)and mental activity (consider Luminosity on the computer)...  Call for any questions...  Let's plan a follow up visit in 78mo, sooner if needed for problems.Marland KitchenMarland Kitchen

## 2016-12-07 NOTE — Progress Notes (Signed)
Subjective:    Patient ID: Peter Nicholson, male    DOB: [birth year], 76 y.o.   MRN: [id number]  HPI 76 y/o WM here for a follow up visit... he has multiple medical problems as noted below...  ~  SEE PREV EPIC NOTES FOR OLDER DATA >>     Hx OSA but he's been off CPAP x yrs & denies sleep disordered breathing, denies snoring, feels he rests well, & wakes refreshed w/o daytime hypersomnolence but he takes a nap every day...  known severe extracranial cerebrovasc dis w/ left CAE 2000 by [facility] & known severe bilat cavernous ICA stenoses; he is also s/p vertebral art angioplasty x2 by DrTDeveshwar w/ known basilar art occlusion  he has severe intracranial atherosclerotic dis and chr cerebellar infarcts on prev scans; memory problems felt to be mild cognitive impairment by DrSethi on eval 3/14 (but he is clearly high risk for vascular dementia problems)...   LABS 10/14 in Epic> REVIEWED.Marland KitchenMarland Kitchen   LABS 11/14:  Chems- OK x BS=182, A1c=8.3, BUN=29, Cr=1.2.Marland KitchenMarland Kitchen   LABS 8/15:  Chems- ok w/ BS=167, A1c=7.3, Cr=1.2.Marland Kitchen.  ~  Adm 11/15- 02/28/14 by [facility] when he presented w/ dysarthria; hx severe vasc dis w/ known severe extracranial cerebrovasc dis w/ left CAE 2000 by DrLawson & known severe bilat cavernous ICA stenoses; he is also s/p vertebral art angioplasty x2 by DrTDeveshwar w/ known basilar art occlusion; he was taking meds on his own & not monitored by family- he had stopped his Plavix (confused, didn't remember), ?DM meds & Simva; Hosp eval included CT Head & MRI/MRA Brain showing old cerebellar & thalamic infarcts and small vessel dis, acute sm nonhemorrhaghic infarcts throughout the left frontal/ temporal/ & parietal areas; Left ICA is occluded (reconstitution of flow in supraclinoid region appears inadeq), mod stenosis of Right ICA cavernous segment; occluded right vertebral and right PICA; high grade stenosis of Left vertebral art & mod narrowing of prox basilar art- see full report!  Subseq CT Angio Head  & Neck showed prev Left CAE markedly attenuated & occluded at skullbase/ petrous segment of the left ICA, diminutive right vertebral that occludes at the skullbase, dominant left vertebral w/ hi-grade stenosis distally, diffuse atherosclerotic changes... 2DEcho was neg for embolic source... He was placed back on ASA/Plavix & no intervention recommended, reminded about need for strict regulation of BP, Chol, DM... Speech improved in [place] for outpt f/u w/ DrSethi (he has not returned)... ~  Adm 12/27- 04/13/14 by [facility] w/ confusion, ataxia, & speech difficulty w/ BS found to be 666 (this is when it was discovered that he was taking his own meds without supervision & several meds hadn't been filled in months); he had an Enterococcus UTI- treated w/ Augmentin; repeat MRI/MRA Brain w/o change from Nov; he was re-evaluated by [facility] for IR- cerebral angiography performed and attempted revasc of the left ICA was attempted but unsuccessful, he was disch home on same meds (ASA/Plavix) w/ supervision of all meds from wife; Lisinopril was held & they rec keeping BP in the 130-160 range...   LABS 5/16:  FLP- at goals on Atorva80;  Chems- ok w/ BS=130, A1c=6.8, Cr=1.27;  CBC- ok w/ Hg=12.4;  B12 level= 196...  ~  09/2014:  he went to see [facility] for [facility]Neuro 6/16 noting memory loss, MOCA=20/30, hx of severe vasc dis etc; they started Aricept5=>10, MVI, and B12 supplement; also rec physical exercise & brain stimulation thru puzzles etc. ~  10/2014: he had f/u w/ DrDeveshwar from IR>  MRI/MRA of the Brain revealed chronic ischemic changes stable from his prev scans, neg for acute infarcts; chr occlusion of left ICA, mod-severe stenosis of the right cavernous carotid, occlusion of the distal right vertebral art, mod-severe stenosis of the distal left vertebral as well; he is continuing to follow the pt...  ~  02/2015:  he is too sedentary- not exercising at all, just watching TV, wife says he won't read/ work  puzzles/ do anything substantive regarding physical or mental exercises...   LABS 02/25/15 showed BS=226, A1c=6.7, K=5.5, Cr=1.15, B12=426...   ~  Aug 22, 2015:  48moROV & [name] indicates that he is doing well, no new complaints or concerns- and his wife confirms; due for f/u CXR & full labs today but he is not fasting, therefore we will elim the FLP & check everything else; they request refill of Alpraz0.5=> done... We reviewed the following medical problems during today's office visit >>     OSA> he's been off CPAP x yrs & denies sleep disordered breathing, denies snoring, feels he rests well, & wakes refreshed w/o daytime hypersomnolence but he takes a nap every day...    HBP> on Losartan50; BP= 132/60 & reminded to avoid sodium & keep weight down; he is essentially asymptomatic w/o CP, palpit, SOB, edema, etc...    Cerebrovasc dis> on his ASA81 & Plavix75 (meds supervised by wife); known severe [facility]/ [facility] known severe bilat cavernous ICA stenoses (occluded left); he is also s/p vertebral art angioplasty x2 by DAzell Derw/ known basilar art occlusion & severe intracranial atherosclerotic dis; Scans 11/15 revealed acute watershed infarcts in left frontal/ temporal/ parietal lobes & old infarcts in cerebellum & thalamus; DrTDeveshwar attempted intervention to left carotid system but this was reported as unsuccessful (unsucceeful attempt at revascularization of occluded LICA cavernous segment w/ multiple collaterals noted);  Last saw DrTDeveshwar 10/2014- note reviewed & 7/16 MRI/MRA & catheter angiogram reviewed=> tight focal stenosis of the right internal carotid artery distal cavernous segment, and the left vertebrobasilar junction suggestive of interval worsening. However no parenchymal changes suggestive of intervening or acute ischemic changes were noted on the DWI images- therefore they opted for continued medical management.    Chol> on Lip80 now +  diet; last FLP 5/16 showed TChol 113, TG 51, HDL 48, LDL 54 => much improved, continue the same; reminded to come fasting for f/u FLP.    DM> wt is down 11# to 173# today; on Lantus15u now, Metform500-2Bid, Diabeta5AM, Onglyza5PM; compliance w/ meds was poor but wife supervises now; Labs 5/16 shows BS=130, A1c=6.8 => Labs 11/16 showed BS=226, A1c=6.7 => Labs 5/17 showed BS=142 & A1c wasn't done; Rec to continue same meds, good job w/ wt loss...    GU- Hx horseshoe kidney, renal cell cancer, nephrolithiasis> right side of horseshoe kid removed 1994 (RCCa); followed by Urology for yrs & no recurrence; Creat in the 1.1-1.5 range; PSA in the 1-3 range; Labs 5/17 showed BUN=53, Cr=1.43, PSA=1.01; Rec to incr water intake...    Neuro- memory impairment> on ASA81 & Plavix75, plus MVI etc; he has severe intracranial atherosclerotic dis, chr cerebellar infarcts on prev scans, & new watershed infarcts on left 11/15; memory problems felt to be mild cognitive impairment by DrSethi on eval 3/14, vascular dementia in light of numerous sm infarcts on scans; wife wants 2nd opinion Neuro & [facility] noting memory loss, MOCA=20/30, hx of severe vasc dis etc; they started Aricept5=>10, MVI,  and B12 supplement; also rec physical exercise & brain stimulation thru puzzles etc; Last f/u DrAquino 08/02/15- reviewed, tolerating Aricept, stable & independent w/ ADLs, drives a little, DSKA=76/81, no ch in meds.     Anxiety/Depression> on Xanax0.5 prn & Zoloft50...     B12 deficiency> Labs 5/16 showed B12 level = 196 & rec to add 1077mg VitB12 OTC to his MVI daily; Labs 11/16 showed B12=426...    Mild Anemia>  Labs 5/17 showed Hg=11.1, MCV=94;  Rec to add 1-a-day w/ Fe to his B12 supplement EXAM shows Afeb, VSS w/ BP=126/68 & O2sat=98% on RA;  HEENT- neg, mallampati2;  Chest- clear w/o w/r/r;  Heart- RR, w/o m/r/g;  Abd- soft, neg;  Ext- neg w/o c/c/e...  CXR 08/22/15 showed norm heart size, atherosclerotic Ao,  clear lungs, NAD; arthritis in Tspine...   LABS 5/17>  FLP- not done;  Chems- ok x BUN=53, Cr=1.43, BS=142 (rec to incr free water + low carb diet);  CBC- ok x Hg=11.1, MCV=94 (rec to add 1-a-day w/ Fe to his B12 supplement daily);  TSH=2.50;  PSA=1.01... IMP/PLAN>>  Peter Nicholson mostly stable- he's followed by Neuro & IR for his cerebrovasc dis & MCI on Aricept;  Medically improved w/ wt reduction- good job!  Rec to continue same meds regularly, diet, exercise, drink more free water, & add 1-a-ady w/ Fe to his B12 vit daily...  ~  November 13, 2015:  321moOV &post hosp check>  [facility]> presented w/ unsteady gait, slurred speech & word finding problems; eval revealed an acute left brain stroke (MRI showed lacunar infarct in left caudate nucleus) & chronically occluded left ICA w/ stenosis in right ICA and left vertebral art => they rec [facility] on DiMaceoome (see below);  Since disch they note slurring has resolved, unsteadiness about the same & no change over the last several months-- we discussed referral & f/u by NEURO, DrAquino (to consider poss Neuro-rehab) but they decline f/u appt w/ DrTDeveshwar at this time...  we reviewed the following medical problems during today's office visit >>     OSA> he's been off CPAP x yrs & denies sleep disordered breathing, denies snoring, feels he rests well, & wakes refreshed w/o daytime hypersomnolence but he takes a nap every day...    HBP> on Losartan50 + diet; BP= 126/62 & reminded to avoid sodium & keep weight down; he is essentially asymptomatic w/o CP, palpit, SOB, edema, etc...    Severe cerebrovasc dis> on his ASA81 & Plavix75 (meds supervised by wife); known severe extracranial [facility]asc [facility]/ [facility] known severe bilat cavernous ICA stenoses (occluded left); he is also s/p vertebral art angioplasty x2 by DrAzell Der/ known  basilar art occlusion & severe intracranial atherosclerotic dis; Scans 11/15 revealed acute watershed infarcts in left frontal/ temporal/ parietal lobes & old infarcts in cerebellum & thalamus; DrTDeveshwar attempted intervention to left carotid system but this was reported as unsuccessful (unsucceeful attempt at revascularization of occluded LICA cavernous segment w/ multiple collaterals noted);  Last saw DrTDeveshwar 10/2014- note reviewed & 7/16 MRI/MRA & catheter angiogram reviewed=> tight focal stenosis of the right internal carotid artery distal cavernous segment, and the left vertebrobasilar junction suggestive of interval worsening- they opted for continued medical management;  ADM to [facility] 10/2015 w/ acute lacunar infarct in left caudate nucleus- DC on same  ASA/PLAVIX to f/u w/ Neuro & IR...    Chol> on Lip80 + diet; last FLP 5/16 showed TChol 113, TG 51, HDL 48, LDL 54 => much improved, continue the same; reminded to come fasting for f/u FLP.    DM> wt is down 3# to 170# today; on Lantus15u now, Metform500-2Bid, Glucotrol5AM, Onglyza5PM; compliance w/ meds was poor but wife supervises now; Labs 5/16 shows BS=130, A1c=6.8 => Labs 11/16 showed BS=226, A1c=6.7 => Labs 5/17 showed BS=142 & A1c wasn't done; Rec to continue same meds, good job w/ wt loss...    GU- Hx horseshoe kidney, renal cell cancer, RI, nephrolithiasis> right side of horseshoe kid removed 1994 (RCCa); followed by Urology for yrs & no recurrence; Creat in the 1.1-1.5 range; PSA in the 1-3 range; Labs 5/17 showed BUN=53, Cr=1.43, PSA=1.01; Rec to incr water intake...    Neuro- memory impairment> on ASA81 & Plavix75, plus MVI etc; he has severe intracranial atherosclerotic dis, chr cerebellar infarcts on prev scans, & new watershed infarcts on left 11/15; memory problems felt to be mild cognitive impairment by DrSethi on eval 3/14, vascular dementia in light of numerous sm infarcts on scans; wife wants 2nd opinion Neuro & we referred to  [facility] 6/16 noting memory loss, MOCA=20/30, hx of severe vasc dis etc; they started Aricept5=>10, MVI, and B12 supplement; also rec physical exercise & brain stimulation thru puzzles etc; Last f/u DrAquino 08/02/15- reviewed, tolerating Aricept, stable & independent w/ ADLs, drives a little, WYOV=78/58, no ch in meds.     Anxiety/Depression> on Xanax0.5 prn & Zoloft50...     B12 deficiency> Labs 5/16 showed B12 level = 196 & rec to add 1072mg VitB12 OTC to his MVI daily; Labs 11/16 showed B12=426...    Mild Anemia>  Labs 5/17 showed Hg=11.1, MCV=94;  Rec to add 1-a-day w/ Fe to his B12 supplement EXAM shows Afeb, VSS w/ BP=126/62 & O2sat=100% on RA;  HEENT- neg, mallampati2;  Chest- clear w/o w/r/r;  Heart- RR, w/o m/r/g;  Abd- soft, neg;  Ext- neg w/o c/c/e; Neuro- sl slow mentation, no focal deficits...  CT Head [facility] 10/22/15>  No acute changes-- low attenuation in left frontal & left parietal lobes c/w old infarcts, old right cerebellar infarct...  MRI Brain [facility] 10/25/15>  small acute lacunar infarct in the left caudate nucleus; no associated hemorrhage or mass effect; underlying severe chronic ischemia and circle of Willis vasculopathy (including chronic left ICA occlusion) as described on the comparison MRI/MRA from 04/2015...  EKG [facility] 10/26/15>  NSR, rate60, wnl, NAD...  2DEcho [facility] 10/25/15>  normal left ventricle size and function, with no regional wall motion abn, EIF=02-77% normal diastolic function, no evidence of elevated left atrial pressure, no evidence of systolic or diastolic heart failure, no cardiac source of emboli detected, mild aortic valve sclerosis, good mobility, trace regurgitation and no evidence of stenosis.  LABS [facility] 10/2015>  Chems- ok x BS=68-199, BUN=37, Cr=1.46;  CBC- Hg=11.1..Marland KitchenMarland Kitchen IMP/PLAN>>  Peter Nicholson back to his baseline which includes severe cerebrovasc dis- s/p mult interventions by DrTDeveshwar & [facility] he remains on ASA/ Plavix & we will  refer to Neuro-DrAquino for her review; pt does not want uKoreato set up a follow up appt w/ DrDeveshwar at this time... We plan recheck 3766mo.  ~  June 08, 2016:  6-66m31moV & JamTramaynes has a relatively quiet interval since he was last seen 11/2015> wife notes that memory seems sl worse, repeating himself,  etc but he has not required [facility] or ER visits since the [place] for an acute left sided lacunar infarct;  we reviewed EPIC data & the following medical problems during today's office visit >>     He had IR f/u eval by [facility] 03/2016 w/ MRI/ MRA done>  It was stable compared to 04/2015 w/ left ICA occlusion, high-grade right cavernous ICA narrowing, chronic right vertebral occlusion & high-grade distal left vertebral stenosis, no acute infarcts...    He had ENT eval DrBates 05/06/16>  Hearing loss & cerumen removed...    He had a Neuro f/u appt w/ DrAquino 05/13/16>  cerebrovasc dis, vasc dementia on Aricept10, memory about the same- MMSE=22/30, again stressed the need for phys activ & mental exercoise...    OSA> he's been off CPAP x yrs & denies sleep disordered breathing, denies snoring, feels he rests well, & wakes refreshed w/o daytime hypersomnolence but he takes a nap every day...    HBP> on Losartan50 + diet; BP= 150/60 & reminded to avoid sodium & keep weight down; he is essentially asymptomatic w/o CP, palpit, SOB, edema, etc...    Severe cerebrovasc dis> on his ASA81 & Plavix75 (meds supervised by wife); known severe extracranial cerebrovasc dis [facility]/ [facility] & known severe bilat cavernous ICA stenoses (occluded left); he is also s/p vertebral art angioplasty x2 by Azell Der w/ known basilar art occlusion & severe intracranial atherosclerotic dis; Scans 11/15 revealed acute watershed infarcts in left frontal/ temporal/ parietal lobes & old infarcts in cerebellum & thalamus; DrTDeveshwar attempted intervention to left carotid system but this was reported as unsuccessful  (unsucceeful attempt at revascularization of occluded LICA cavernous segment w/ multiple collaterals noted);  Last saw DrTDeveshwar 10/2014- note reviewed & 7/16 MRI/MRA & catheter angiogram reviewed=> tight focal stenosis of the right internal carotid artery distal cavernous segment, and the left vertebrobasilar junction suggestive of interval worsening- they opted for continued medical management;  ADM to [facility] 10/2015 w/ acute lacunar infarct in left caudate nucleus- DC on same ASA/PLAVIX to f/u w/ Neuro & IR;  F/u IR assessment 03/2016 by DrDevershwar as above...    Chol> on Lip80 + diet; last FLP 2/18 showed TChol 111, TG 132, HDL 49, LDL 35 => much improved, continue the same; reminded to come fasting for f/u FLP.    DM> wt is stable at 169# today; on Lantus15u now, Metform500-2Bid, Glucotrol5AM, Onglyza5PM; compliance w/ meds was poor but wife supervises now; Labs w/ BS ~140-180, A1c=6.7;  Rec to continue same meds + diet.    GU- Hx horseshoe kidney, renal cell cancer, RI, nephrolithiasis> right side of horseshoe kid removed 1994 (RCCa); followed by Urology for yrs & no recurrence; Creat in the 1.1-1.5 range; PSA in the 1-3 range; Labs 2/18 showed BUN=38, Cr=1.28, PSA=1.16; Rec to incr water intake...    Neuro- memory impairment> on ASA81 & Plavix75, plus MVI etc; he has severe intracranial atherosclerotic dis, chr cerebellar infarcts on prev scans, & new watershed infarcts on left 11/15; memory problems felt to be mild cognitive impairment by DrSethi on eval 3/14, vascular dementia in light of numerous sm infarcts on scans; wife wants 2nd opinion Neuro & we referred to [facility] 6/16 noting memory loss, MOCA=20/30, hx of severe vasc dis etc; they started Aricept5=>10, MVI, and B12 supplement; also rec physical exercise & brain stimulation thru puzzles etc; Last f/u DrAquino 1/18- reviewed, tolerating Aricept, stable & independent w/ ADLs, drives a little, OINO=67/67, no ch in  meds.      Anxiety/Depression> on Xanax0.5 prn & Zoloft50...     B12 deficiency> Labs 2/18 showed B12 level = 704 on 1029mg VitB12 OTC + MVI daily    Mild Anemia>  Labs 2/18 showed Hg=10.9, MCV=94;  Rec to add 1-a-day w/ Fe to his B12 supplement EXAM shows Afeb, VSS w/ BP=150/60 & O2sat=100% on RA;  HEENT- neg, mallampati2;  Chest- clear w/o w/r/r;  Heart- RR, w/o m/r/g;  Abd- soft, neg;  Ext- neg w/o c/c/e; Neuro- sl slow mentation, no focal deficits...  MRI/ MRA Brain done 03/20/16 by DrTDeveshwar>  stable compared to 04/2015 w/ left ICA occlusion, high-grade right cavernous ICA narrowing, chronic right vertebral occlusion & hig-grade distal left verttebral stenosis, no acute infarcts...  LABS 05/2016>  FLP- at goals on Lip80;  Chems- ok w/ Cr=1.28. BS=181;  CBC- mild anemia w/ Hg=10.9, mcv=97;  TSH=2.81;  PSA=1.16;  B12=704, VitD=38 IMP/PLAN>>  Peter Nicholson had a relatively quiet/ stable interval & is doing satis on his current regimen;  On ASA/ Plavix & Losar50 w/ adeq BP control x "white coat" (reminded no salt);  DM treated w/ Lantus 15u + 3 meds & labs are reasonably well maintained;  Continue same meds, PLEASE incr phys activity & mental exercises, rov 6454mo.   ~  December 07, 2016:  54m9moV & medical follow up visit> Peter Nicholson he's "doing great" no new complaints or concerns from pt;  Epic review shows no interval medical specialty visits, ER, or hospitalizations... We reviewed the following medical problems during today's office visit >>     His breathing is good- no cough, sput, hemoptysis, SOB, etc...    His BP is stable on Losartan50- measures 140/70 here today & he denies CP, palpit, dizzy, SOB, edema...    He remains on ASA/PLAVIX for his severe cerebrovasc dis & he says stable w/o interval cerebral ischemic symptoms...    DM controlled on Lantus15, Metform500-2Bid, Glucotrol-XL 5mg34mm and Onglyza5mg 78m  Labs today showed BS=91, A1c=6.4, Cr=1.42;  He is maintainig good DM regulation, continue same  rx...    Neuro-- no change per wife, memory loss stable, cooperative, not getting enough phys activity or mental exercises! EXAM shows Afeb, VSS w/ BP=140/70 & O2sat=98% on RA;  HEENT- neg, mallampati2;  Chest- clear w/o w/r/r;  Heart- RR, w/o m/r/g;  Abd- soft, neg;  Ext- neg w/o c/c/e; Neuro- sl slow mentation, no focal deficits...  LABS 12/07/16>  Chems- ok w/ BS=91, BUN=34, Cr=1.42, A1c=6.4;  CBC- mild anemia w/ Hg=10.8, mcv=99 (no change from prev)...  IMP/PLAN>>  Peter Nicholson stable on current regimen- BP is ok, DM cotrol is good;  Major prob is his atherosclerotic dis & cerebrovasc changes w/ mult occlusions, strokes, etc-- follwed by Neuro-DrAquino, and IR-DrDeveshwar... Rec to continue same meds and follow up visits...           Problem List:     Hearing Loss >> [facility] & rec to consider hearing aides...   OBSTRUCTIVE SLEEP APNEA (ICD-327.23) - s/p split night sleep study 11/08- showing an AHI of 19, assoc w/ an O2 sat down to 78%... during the CPAP trial he titrated up to a pressure of 11cm but never ret to REM sleep so optimal pressure is still ???...he uses [facility] for his CPAP needs... ~  12/10: he tells me that he has stopped the CPAP, that he sleeps better without it, not snoring/  rests well, & wakes feeling refreshed... he refuses Sleep Med re-eval etc... ~  11/14: he's been off CPAP x yrs & denies sleep disordered breathing, denies snoring, feels he rests well, & wakes refreshed w/o daytime hypersomnolence, he naps daily... ~  2015> he reports stable & denies sleep disordered breathing...  Hx of HYPERTENSION >>   ~  not currently on medications =>  ~  6/12: BP= 136/80 off meds & denies HA, visual changes, CP, palipit, dizziness, syncope, dyspnea, edema, etc... he states that home BP checks are "OK". ~  CXR 11/12 showed borderline heart size, tort Ao, clear lungs w/ ?underlying COPD, NAD...  ~  1/13: he was  sent home from the [place] on Lisinopril & Lasix; both stopped 1/13 by TP w/ renal insuffic & elev K+; subseq improved & BP stable... ~  2/13: BP= 112/68 & feeling weak etc...  3/13:  BP= 140/72 & feeling some better... ~  6/13:  BP= 138/78 & feeling much better; denies CP, palpit, ch in SOB or edema... ~  10/13:  BP= 140/82 & he is c/o reflux & dysphagia today, no CP/ palpit/ SOB/ edema... ~  11/14: not currently on meds, just diet controlled; BP= 140/86 & reminded to avoid sodium & get weight down; he is essentially asymptomatic w/o CP, palpit, SOB, edema, etc. ~  2/15: still not on meds, just diet controlled; BP= 124/76 & he remains asymptomatic...  ~  8/15: not currently on meds, just diet controlled; BP= 142/84 & reminded to avoid sodium & get weight down; he is essentially asymptomatic w/o CP, palpit, SOB, edema, etc ~  1/16: still not on meds, just diet controlled; BP= 132/80 & Neuro has rec BP range of 130-160 to aid perfusion due to his severe cerebrovasc dis... ~  2/16: BP 170/80 => recheck 150/80 range; we decided to add Losartan 87m/d for BP & vasc dis... ~  5/16: on Losartan50; BP= 144/76 & he says he's asymptomatic, continue same... ~  11/16: on Losartan50; BP= 126/68 & reminded to avoid sodium & get weight down; he is essentially asymptomatic w/o CP, palpit, SOB, edema, etc... ~  5/17: on Losartan50; BP= 132/60 & reminded to avoid sodium & keep weight down; he remains largely asymptomatic.  CEREBROVASCULAR DISEASE (ICD-437.9) - stable on ASA 3284md & PLAVIX 7530m per DrDeveshwar/ DrLawson/ DrReynolds... he has severe extracranial cerebrovasc disease- esp in the post circulation, and he is s/p vertebral art angioplasty x 2, and w/ a known basilar art occlusion- prev on Coumadin controlled by Neurology (switched to ASA/ Plavix in 2010)... also followed by DrLSheryn Bisond DrDeveshwar for IR... [facility]  CDoppler 2/07 showed 0-39% bilat ICA stenoses, s/p  left CAE w/ DPA... ~  eval 3/10 by DrLSheryn Bison CDopplers that showed similar patency w/ 0-39% bilat ICA stenoses; due to the 10 yr interval DrLawson did not feel he needed any additional follow up. ~  12/10: continued f/u DrDevershwar IR w/ MRI/ MRAs showing bilat carotid siphon region atherosclerosis & narrowing (unchanged), and markedly diseased distal left vertebral & prox basilar art narrowing (no change)... ~  11/12: CTA head & neck showed similar severe dis w/ mod to severe bilat cavernous ICA stenoses due to extensive atherosclerosis; severe distal vertebral atherosclerosis- distal right occluded, distal left w/ high grade stenosis; mod to severe bilat PCA stenoses; chr cerebellar infarcts, no acute infarcts. 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 ~  11/15: Hosp by [facility] when he presented w/ dysarthria; hx severe vasc dis w/ known severe extracranial cerebrovasc dis w/ left CAE 2000 by [facility] & known severe bilat cavernous ICA stenoses; he is also s/p vertebral art angioplasty x2 by DrTDeveshwar w/ known basilar art occlusion; he was taking meds on his own & not monitored by family- he had stopped his Plavix (confused, didn't remember), ?DM meds & Simva; Hosp eval included CT Head & MRI/MRA Brain showing old cerebellar & thalamic infarcts and small vessel dis, acute sm nonhemorrhaghic infarcts throughout the left frontal/ temporal/ & parietal areas; Left ICA is occluded (reconstitution of flow in  supraclinoid region appears inadeq), mod stenosis of Right ICA cavernous segment; occluded right vertebral and right PICA; high grade stenosis of Left vertebral art & mod narrowing of prox basilar art- see full report!  Subseq CT Angio Head & Neck showed prev Left CAE markedly attenuated & occluded at skullbase/ petrous segment of the left ICA, diminutive right vertebral that occludes at the skullbase, dominant left vertebral w/ hi-grade stenosis distally, diffuse atherosclerotic changes... 2DEcho was neg for embolic source... He was placed back on ASA/Plavix & no intervention recommended, reminded about need for strict regulation of BP, Chol, DM... Speech improved in [place] for outpt f/u w/ DrSethi (he did not return)... ~  12/15: Hosp by [facility] w/ confusion, ataxia, & speech difficulty w/ BS found to be 666 (this is when it was discovered that he was taking his own meds without supervision & several meds hadn't been filled in months); he had an Enterococcus UTI- treated w/ Augmentin; repeat MRI/MRA Brain w/o change from Nov; he was re-evaluated by [facility] for IR- cerebral angiography performed and attempted revasc of the left ICA was attempted but unsuccessful, he was disch home on same meds (ASA/Plavix) w/ supervision of all meds from wife; Lisinopril was held & they rec keeping BP in the 130-160 range...  ~  5/16: he has severe vascular dis & wife is concerned about memory loss & behavioral changes- continue ASA/ Plavix & she requests Neuro 2nd opinion... ~  11/16: on his ASA81 & Plavix75 (meds supervised by wife); known severe extracranial cerebrovasc dis w/ left CAE 2000 by [facility] & known severe bilat cavernous ICA stenoses (occluded left); he is also s/p vertebral art angioplasty x2 by Azell Der w/ known basilar art occlusion & severe intracranial atherosclerotic dis; Scans 11/15 revealed acute watershed infarcts in left frontal/ temporal/ parietal lobes & old infarcts in cerebellum &  thalamus; DrTDeveshwar attempted intervention to left carotid system but this was reported as unsuccessful (unsucceeful attempt at revascularization of occluded LICA cavernous segment w/ multiple collaterals noted);  Last saw DrTDeveshwar 10/2014- note reviewed & 7/16 MRI/MRA & catheter angiogram reviewed=> tight focal stenosis of the right internal carotid artery distal cavernous segment, and the left vertebrobasilar junction suggestive of interval worsening. However no parenchymal changes suggestive of intervening or acute ischemic changes were noted on the DWI images- therefore they opted for continued medical management. ~  5/17: he remains on ASA81 +  Plavix75 => clinically stable w/ his severe cerebrovasc dis... ~  10/2015:  He was HOSP at [facility] w/ lacunar infarct in left caudate nucleus => spont improvement, no change in meds, referred back to Neurology- DrAquino...  VENOUS INSUFFICIENCY (ICD-459.81) - he knows to avoid sodium, elevate legs, wear support hose...  HYPERCHOLESTEROLEMIA (ICD-272.0) - on CRESTOR 75m/d since Hosp 12/12... ~  [place]6/08 showed TChol 153, TG 120, HDL 36, LDL 93 ~  FLP 8/09 showed TChol 131, TG 84, HDL 38, LDL 76 ~  2010:  pt was not fasting for blood work... rec to continue Crestor10 + diet efforts. ~  FLP 10/11 showed TChol 210, TG 161, HDL 40, LDL 149... rec to start Crestor 280md, he will decide. ~  FLP 6/12 on Cres20 showed TChol 106, TG 95, HDL 41, LDL 46... Continue same. ~  [place] showed TChol 100, TG 101, HDL 37, LDL 43... They decr to Cres10. ~  FLP 3/13 on Cres10 showed TChol 118, TG 67, HDL 44, LDL 61... Continue same, later switched to Simva40 for $$ reasons. ~  FLP 10/14 on Simva40 showed TChol 136, TG 54, HDL 49, LDL 77... Continue same. ~  FLP 12/15 on Simva40 w/ ?compliance showed TChol 175, TG 105, HDL 41, LDL 113 => changed to [place]/ supervision of all meds by wife. ~  FLP 5/16 on Atorva80 showed TChol 113, TG 51, HDL 48,  LDL 54 => much improved, continue the same.  DIABETES MELLITUS (ICD-250.00) >>  ~  on METFORMIN 50068m2Bid, GLYBURIDE 5mg40m, ACTOS 30mg27m& ONGLYZA 5mg/d37m ~  labs 6/08 showed BS= 155, HgA1c= 8.1... (glMarland Kitchenburide incr to 5Bid). ~  labs 11/08 showed BS= 352, HgA1c= 8.1.... (Actos added). ~  labs 8/09 showed BS= 128, HgA1c= 7.1... recMarland KitchenMarland Kitchensame meds, better diet, get wt down! ~  labs 2/10 showed BS= 249, A1c= 7.3.... Metform incr to 2Bid. ~  labs 12/10 (nonfasting- on all 3 meds) showed BS= 87, A1c= 6.9 ~  labs 10/11 on Metform1000Bid+Gybur5Bid showed BS= 180, A1c= 8.4... recMarland KitchenMarland Kitcheno incr GLYBUR10Bid + diet. ~  labs 6/12 on Metform1000Bid+Glybur10Bid showed BS= 196, A1c= 11.2 ?what happened?> he declined insulin & Endocrine consult, opted for max oral meds by adding ACTOS 30mg/d60mNGLYZA 5mg/d..41m  [place] and A1c= 7.2; covered w/ SSI in hosp & placed back on 4 oral meds on disch from rehab... ~  Labs 1/13 on all 4 meds showed BS= 230, A1c=7.1; we reviewed diet & he will start PT soon... ~  Labs 3/13 on 4meds sh32md BS= 72, A1c= 6.6 ~  4/13: pt called w/ hypoglycemia & we decreased Glyburide 5mg from 62md to 1Bid... ~  6/13:  Pt on Metform500-2Bid, Glybur5mgBid, Ac23m30, Onglyza5; BS at home 70-90 he says & still drinking Boost; Labs showed BS=190  A1c=7.4  ; we decided to cut the Glyburide5mg Qam onl56mut keep the Metform500-2Bid +Actos +Onglyza... ~  10/13: on his 4meds> BS=1753mA1c=7.7 but he's gained 16#; told to STOP the boost, get on diet, get wt down, take all meds every day... ~  10/14: seen by TP> Labs 10/14 showed BS=252, A1c=9.1 & Lantus added to his Metform500-2Bid, Diabeta5, Onglyza5; Lantus10u added... ~  11/14: on Lantus10u, Metform500-2Bid, Diabeta5, Onglyza5, off Actos; f/u labs 11/14 showed BS=182, A1c=8.3; continue same, get wt down. ~  8/15: on Lantus10u, Metform500-2Bid, Diabeta5, Onglyza5; Labs 8/15 showed BS=167, A1c=7.3... Continue mMarland KitchenMarland Kitchens and get wt  down. ~  12/15: now on Lantus15u now, Metform500-2Bid, Diabeta5AM, Onglyza5PM; compliance w/ meds was poor when taking his own meds; Labs 12/15 showed BS=146-665, A1c=9.3 => now all supervised by wife. ~  2/16: on Lantus15u now, Metform500-2Bid, Diabeta5AM, Onglyza5PM; Labs 2/16 showed BS=96, A1c=7.6, therefore continue same meds & diet... ~  5/16: on Lantus15u, Metform500-2Bid, Diabeta5AM, Onglyza5PM; compliance w/ meds was poor but wife supervises now; Labs 5/16 shows BS=130, A1c=6.8 => much improved, continue same. ~  11/16: on Lantus15u, Metform500-2Bid, Diabeta5AM, Onglyza5PM; wife supervises meds; Labs 11/16 showed BS=226, A1c=6.7, continue same meds, better diet. ~  5/17: on same meds & doing satis;  Labs 5/17 showed random BS=142, A1c wasn't done as requested...  OVERWEIGHT (ICD-278.02) - he states that he is not on much of a diet & we reviewed low carb/ no sweets/ low fat diet. ~  weight 8/09 = 216# ~  weight 2/10 = 216# ~  weight 12/10 = 212# ~  weight 10/11 = 210# ~  Weight 6/12 = 206# and he admits to not being on much of a diet... ~  Weight 2/13 = non weight bearing still... ~  Weight 3/13 = 193# ~  Weight 6/13 = 191# ~  Weight 10/13 = 206# ~  Weight 11/14 = 198# ~  Weight 2/15 = 193# ~  Weight 1/16 = 181# ~  Weight 5/16 = 189# ~  Weight 11/16 = 189# ~  Weight 5/17 = 173# good job w/ wt reduction, diet, exercise...  GERD (ICD-530.81) - prev on Nexium40, he switched to OTC Prilosec, now [place] 29m/d due to Plavix Rx; pt had stopped this on his own & asked to restart 10/13. ~  EGD 19/03 showed 4[place]uleta GERD & esoph stricture dilated... ~  EGD 12/09 by DrPatterson showed HH & severe erosive esophagitis from GERD... ~  10/13: presents w/ incr reflux- w/ dysphagia & food sticking in mid-esoph that occurs every 2-3days & has to vomit to feel better; he had stopped his PPI on his own (?why?), rec to restart the Protonix40 & we will refer to GI for EGD & dilatation... ~  10/13: he  saw DrPatterson> EGD 11/13 showed HH, chr GERD, stricture dil w/ 43F Maloney dilator, erosive gastritis was HPylori neg & treated w/ daily PPI rx...  ~  He remains on Protonix40/d & doing satis w/o reflux symptoms...  DIVERTICULOSIS OF COLON (ICD-562.10) - note: [place]; prev [place] recently noted loose stools on the Metformin 10040mid... ~  colonoscopy 10/03 by DrPatterson showed divertics only... ~  colonoscopy 12/09 by DrPatterson was also normal x for divertics... ~  He denies n/v, c/d, blood seen...  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 Hx of RENAL CELL CANCER (ICD-189.0) - he had a partial nephrectomy of the right side of his horseshoe kidney in 1994 due to renal cell ca... still followed by DrGrawnearly by his hx but we don't have any recent notes... ~  labs 10/11 showed PSA= 0.95 ~  Labs 3/13 showed BUN=21, Creat=1.1, PSA=2.75 ~  Labs 10/13 showed BUN= 25, Creat= 1.2 ~  Labs 10/14 showed BUN=38, Cr=1.5 (w/ enterococcus UTI);  Labs 11/14 showed BUN=29, Cr=1.2 ~  Labs 8/15 showed BUN= 24, Cr= 1.2 ~  Labs 12/15 showed Cr= 1.1-1.4 ~  Labs 2/16 showed BUN= 18, Cr= 1.12  Hx of NEPHROLITHIASIS (ICD-592.0) - remote hx of kidney stones followed by DrPeterson...  DEGENERATIVE  JOINT DISEASE (ICD-715.90) &  GOUT (ICD-274.9) - on ALLOPURINOL 366m/d... ~  labs 10/11 showed URIC= 4.7 ~  [facility] after fall from ladder cleaning gutters w/ complex right tibial plat fx- ORIF, 2 operations by DrHardy, & hosp course complic by ileus, TIA, electrolyte abn, etc;  Event [facility]ch [facility] Rehab & [facility]  BACK PAIN, LUMBAR (ICD-724.2) - known degenerative spondylitic disease at L4-5 and L5-S1 [facility]now DrEls[facility] & DrHarkins)... ~  6/12: he reports further back pain w/ eval by Chiropractor showing L4 disc prob & regular  adjustments; but now notes incr pain & he requests referral to Neurosurg for further eval ==> seen by DrElsner, then DrHarkins for shots which has helped... ~  2014: he has continued f/u w/ DrHarkins/ [facility] & received periodic ESI injections for his Lumbar DDD; they added NORTRIPTYLINE 253mhs & slowly incr the dose  NEURO >> Hx Left Hemispheric TIA in 11/12 & MEMORY IMPAIRMENT ~  3/14:  He had Neuro eval DrSethi> he did Labs, EEG, MRI Brain, & MRA Brain & Neck; Rec to take 2 FishOil caps daily, CerefolinNAC daily, work puzzles etc, continue Plavix & strict control of BP, Chol, DM; they did not start Aricept... ~  11/15 & 12/15> see above> Hosp w/ wastershed strokes left frontal/ temporal/ parietal areas when he wasn't taking his Plavix; now all meds supervised by wife; he had MRIs, CT Angio, and Arteriograms w/ attempted intervention on left by DrTDeveshwar- unsuccessful & rec for continued ASA/ Plavix, & control on BP, Chol, DM w/ BP in the 130-160 range... 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 ~  6/16: Eval by [facility], c/w mild cognitive impairment w/ MOCA=20/30, started on Aricept & B12 supplement... ~  4/17: f/u DrAquino 08/02/15- reviewed, tolerating Aricept, stable & independent w/ ADLs, drives a little, MMZSWF=09/32no ch in meds.   ANXIETY (ICD-300.00) - uses Zolloft50 & ALPRAZOLAM 0.71m271ms needed.  B12 DEFICIENCY >>  ~  5/16:  Labs showed B12 = 196 & rec to start 1000m19my mouth daily & we will f/u labs... ~  11/16: f/u labs showed B12 = 426, continue B12 supplement daily... ~  Labs 5/17:  CBC showed Hg= 11.1, MCV=94, and rec to add 1-a-day w/ Fe to the B12 daily supplement.   Past Surgical History:  Procedure Laterality Date  . CAROTID ENDARTERECTOMY  09/2000   Dr.Lawson  . CHOLECYSTECTOMY  9/04   Dr. LeonRebekah ChesterfieldEXTERNAL FIXATION LEG  03/10/2011   Procedure: EXTERNAL  FIXATION LEG;  Surgeon: MichRozanna Boxocation: [facility] [place]opedics;  Laterality: Right;  . INCISIONAL HERNIA REPAIR  1/96   Dr.Leone  . NASAL SINUS SURGERY  12/96   Dr.Redman  . 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 ORIF TIBIA PLATEAU  03/24/2011   Procedure: OPEN REDUCTION INTERNAL FIXATION (ORIF) TIBIAL PLATEAU;  Surgeon: MichRozanna Boxocation: [facility] [place]: Orthopedics;  Laterality: Right;  . RADIOLOGY WITH ANESTHESIA N/A 04/12/2014   Procedure: ANGIOPLASTY;  Surgeon: SanjRob Hickman;  [facility] [place];  Laterality: N/A;  . SMALL INTESTINE SURGERY    . veterbral art angioplasty     x2    Outpatient Encounter Prescriptions as of 12/07/2016  Medication Sig  . ALPRAZolam (  XANAX) 0.5 MG tablet TAKE 1/2-1 TABLET BY MOUTH 3 TIMES DAILY AS NEEDED  . aspirin EC 81 MG EC tablet Take 1 tablet (81 mg total) by mouth daily.  Marland Kitchen atorvastatin (LIPITOR) 80 MG tablet TAKE 1 TABLET BY MOUTH EVERY DAY AT 6PM  . clopidogrel (PLAVIX) 75 MG tablet TAKE 1 TABLET BY MOUTH EVERY DAY  . donepezil (ARICEPT) 10 MG tablet Take 1 tablet (10 mg total) by mouth daily.  Marland Kitchen glipiZIDE (GLUCOTROL XL) 5 MG 24 hr tablet Take 1 tablet (5 mg total) by mouth daily with breakfast.  . glucose blood test strip Use as instructed  . Insulin Pen Needle (B-D ULTRAFINE III SHORT PEN) 31G X 8 MM MISC Use with the lantus solostar pen as directed  . LANTUS SOLOSTAR 100 UNIT/ML Solostar Pen INJECT 10 UNITS INTO THE SKIN AT BEDTIME. (Patient taking differently: INJECT 15 UNITS INTO THE SKIN AT BEDTIME.)  . losartan (COZAAR) 50 MG tablet TAKE 1 TABLET (50 MG TOTAL) BY MOUTH DAILY.  . metFORMIN (GLUCOPHAGE) 500 MG tablet TAKE 2 TABLETS TWICE A DAY  . saxagliptin HCl (ONGLYZA) 5 MG TABS tablet Take 1 tablet (5 mg total) by mouth daily.  . sertraline (ZOLOFT) 50 MG tablet TAKE 1 TABLET (50 MG TOTAL) BY MOUTH AT BEDTIME.  . [DISCONTINUED] pantoprazole (PROTONIX)  40 MG tablet TAKE 1 TABLET (40 MG TOTAL) BY MOUTH DAILY.   No facility-administered encounter medications on file as of 12/07/2016.     Allergies  Allergen Reactions  . Dilaudid [Hydromorphone Hcl] Other (See Comments)    hallucinations  . Fentanyl Other (See Comments)    hallucinations    Current Medications, Allergies, Past Medical History, Past Surgical History, Family History, and Social History were reviewed in [employer] record.    Review of Systems        See HPI - all other systems neg except as noted...  The patient complains of cyst on back & dyspnea on exertion.  The patient denies anorexia, fever, weight loss, weight gain, vision loss, decreased hearing, hoarseness, chest pain, syncope, peripheral edema, prolonged cough, headaches, hemoptysis, abdominal pain, melena, hematochezia, severe indigestion/heartburn, hematuria, incontinence, muscle weakness, suspicious skin lesions, transient blindness, difficulty walking, depression, unusual weight change, abnormal bleeding, enlarged lymph nodes, and angioedema.     Objective:   Physical Exam     WD, WN, 76 y/o WM in NAD...  GENERAL:  Alert & oriented; pleasant & cooperative... HEENT:  [place]/AT, EOM-wnl, PERRLA, EACs-clear, TMs-wnl, NOSE-clear, THROAT-clear & wnl. NECK:  Supple w/ fairROM; no JVD; s/p left CAE, +bruits; no thyromegaly or nodules palpated; no lymphadenopathy. CHEST:  Clear to P & A; without wheezes/ rales/ or rhonchi heard... HEART:  Regular Rhythm; gr 1/6 SEM without rubs or gallops detected... ABDOMEN:  Soft & nontender; normal bowel sounds; no organomegaly or masses palpated... EXT: right knee deformity s/p ORIF, mod arthritic changes; no varicose veins/ +venous insuffic/ 1+ edema. NEURO:  CN's intact; no focal neuro deficits... DERM:  Quarter sized sebaceous cyst in mid back.   RADIOLOGY DATA:  Reviewed in the EPIC EMR & discussed w/ the patient...  LABORATORY DATA:  Reviewed in the  EPIC EMR & discussed w/ the patient...   Assessment & Plan:    BP>  Neuro has wanted his BP to be sl higher than usual for perfusion reasons but BP= 170/80 on arrival & improved to 150/80 w/ rest; we opted to start Losartan 31m/d for BP & vasc dis... 5/16> BP= 144/76  today, continue Losar50... 9/16> BP= 120/60 & he remains asymptomatic... ~  Subsequent ROVs w/ stable BP on Losartan50 + diet, wt reduction...  CEREBROVASC Disease & STROKES 7/17, 11/15 & TIA 12/12>  Known severe cerebrovasc dis & he had TIA while in hosp for fx leg 2012 (he was off plavix at the time); Recurrent prob 11/15 w/ watershed infarcts in left frontal/ temporal/ parietal regions while he was off Plavix (he was taking meds on his own & hadn't been filling the Plavix); s/p extensive eval w/ MRI/MRA, CT Angio & Arteriogram by Ladene Artist w/ attempt at intervention on left unsuccessful; wife now supervises all meds... He has prob multi-infarct dementia & wife requests Neuro 2nd opinion => seen by [facility]... 06/08/16>   Peter Nicholson has had a relatively quiet/ stable interval & is doing satis on his current regimen;  On ASA/ Plavix & Losar50 w/ adeq BP control x "white coat" (reminded no salt);  DM treated w/ Lantus 15u + 3 meds & labs are reasonably well maintained;  Continue same meds, PLEASE incr phys activity & mental exercises, rov 2mo8/27/18>    Peter Nicholson stable on current regimen- BP is ok, DM cotrol is good;  Major prob is his atherosclerotic dis & cerebrovasc changes w/ mult occlusions, strokes, etc-- follwed by Neuro-DrAquino, and IR-DrDeveshwar... Rec to continue same meds and follow up visits  NEURO> Mild Cognitive Impairment per DrSethi & wife is concerned re: memory/ vascular dementia; we will arrange for Neuro 2nd opinion=> started on Aricept & stable...  CHOL>  on Atrova80 now & f/u FLP much improved...  DM>  On Lantus15, Metform, Diabeta, Onglyza & compliance is better w/ wife supervision; Labs stable w/ A1c  in the 6's...  Overweight>  We discussed low carb, low fat wt reducing diet;  Nice job w/ wt down to 173# 5/17 OV...  GI>  GERD prev controlled w/ Protonix but he stopped on his own (?why?); EGD 11/13 w/ dilatation & reminded to take the Protonix40 every day...  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  DJD/ Hx Gout/ LBP>  He was seeing chiropractor regularly regarding LBP & then f/u w/ DrElsner,Neurosurg & had ESI by DrHarkins & the shots have helped...  s/p fall w/ right Tibial plateaux fx> 1st ext fixation, 2nd ORIF>  By DrHardy, Ortho; exercise is difficult but very much needed; he's had extensive PT training...  Anxiety>  He remains on Alpraz Prn... Also has [place] his [place] of meds...  B12 deficiency>  Labs 5/16 w/ B12 level = 196; rec to start oral B12 supplement ~10088m/d & repeat B12 level 11/16= 426   Patient's Medications  New Prescriptions   No medications on file  Previous Medications   ALPRAZOLAM (XANAX) 0.5 MG TABLET    TAKE 1/2-1 TABLET BY MOUTH 3 TIMES DAILY AS NEEDED   ASPIRIN EC 81 MG EC TABLET    Take 1 tablet (81 mg total) by mouth daily.   ATORVASTATIN (LIPITOR) 80 MG TABLET    TAKE 1 TABLET BY MOUTH EVERY DAY AT 6PM   CLOPIDOGREL (PLAVIX) 75 MG TABLET    TAKE 1 TABLET BY MOUTH EVERY DAY   DONEPEZIL (ARICEPT) 10 MG TABLET    Take 1 tablet (10 mg total) by mouth daily.   GLIPIZIDE (GLUCOTROL XL) 5 MG 24 HR TABLET    Take 1 tablet (5 mg total) by mouth daily with breakfast.   GLUCOSE BLOOD  TEST STRIP    Use as instructed   INSULIN PEN NEEDLE (B-D ULTRAFINE III SHORT PEN) 31G X 8 MM MISC    Use with the lantus solostar pen as directed   LANTUS SOLOSTAR 100 UNIT/ML SOLOSTAR PEN    INJECT 10 UNITS INTO THE SKIN AT BEDTIME.   LOSARTAN (COZAAR) 50 MG TABLET    TAKE 1 TABLET (50 MG TOTAL) BY MOUTH DAILY.   METFORMIN (GLUCOPHAGE) 500 MG TABLET    TAKE 2 TABLETS TWICE A DAY   SAXAGLIPTIN HCL (ONGLYZA) 5  MG TABS TABLET    Take 1 tablet (5 mg total) by mouth daily.   SERTRALINE (ZOLOFT) 50 MG TABLET    TAKE 1 TABLET (50 MG TOTAL) BY MOUTH AT BEDTIME.  Modified Medications   Modified Medication Previous Medication   PANTOPRAZOLE (PROTONIX) 40 MG TABLET pantoprazole (PROTONIX) 40 MG tablet      TAKE 1 TABLET (40 MG TOTAL) BY MOUTH DAILY.    TAKE 1 TABLET (40 MG TOTAL) BY MOUTH DAILY.  Discontinued Medications   No medications on file

## 2016-12-08 DIAGNOSIS — Z23 Encounter for immunization: Secondary | ICD-10-CM

## 2016-12-26 ENCOUNTER — Other Ambulatory Visit: Payer: Self-pay | Admitting: Pulmonary Disease

## 2017-01-01 ENCOUNTER — Other Ambulatory Visit: Payer: Self-pay | Admitting: Pulmonary Disease

## 2017-01-07 ENCOUNTER — Telehealth: Payer: Self-pay | Admitting: Pulmonary Disease

## 2017-01-07 MED ORDER — SAXAGLIPTIN HCL 5 MG PO TABS: 5.0000 mg | ORAL_TABLET | Freq: Every day | ORAL | 3 refills | Status: DC

## 2017-01-07 NOTE — Telephone Encounter (Signed)
ATC pt, no answer. Left message for pt to call back.  

## 2017-01-07 NOTE — Telephone Encounter (Signed)
Called [facility] to call in Rx but she stated it has to be faxed to [phone]. Faxed RX.

## 2017-01-19 ENCOUNTER — Telehealth (HOSPITAL_COMMUNITY): Payer: Self-pay

## 2017-01-19 NOTE — Telephone Encounter (Signed)
Called to schedule f/u mri, left message for pt to return call. AW 

## 2017-01-26 ENCOUNTER — Telehealth: Payer: Self-pay | Admitting: Pulmonary Disease

## 2017-01-26 DIAGNOSIS — H25043 Posterior subcapsular polar age-related cataract, bilateral: Secondary | ICD-10-CM | POA: Diagnosis not present

## 2017-01-26 DIAGNOSIS — H02839 Dermatochalasis of unspecified eye, unspecified eyelid: Secondary | ICD-10-CM | POA: Diagnosis not present

## 2017-01-26 DIAGNOSIS — H25013 Cortical age-related cataract, bilateral: Secondary | ICD-10-CM | POA: Diagnosis not present

## 2017-01-26 DIAGNOSIS — H2513 Age-related nuclear cataract, bilateral: Secondary | ICD-10-CM | POA: Diagnosis not present

## 2017-01-26 DIAGNOSIS — H2512 Age-related nuclear cataract, left eye: Secondary | ICD-10-CM | POA: Diagnosis not present

## 2017-01-26 NOTE — Telephone Encounter (Signed)
Route to Leigh to follow up after signature.

## 2017-01-27 NOTE — Telephone Encounter (Signed)
Waiting for SN's return to clinic.

## 2017-02-01 NOTE — Telephone Encounter (Signed)
CLA please advise on status of paperwork- thanks!

## 2017-02-01 NOTE — Telephone Encounter (Signed)
handicap placard has been placed in envelope and placed in the brown folder up front.  Nothing further Iis needed

## 2017-02-02 ENCOUNTER — Telehealth: Payer: Self-pay | Admitting: Internal Medicine

## 2017-02-02 MED ORDER — SAXAGLIPTIN HCL 5 MG PO TABS: 5.0000 mg | ORAL_TABLET | Freq: Every day | ORAL | 0 refills | Status: AC

## 2017-02-02 NOTE — Telephone Encounter (Signed)
Spoke with pt's spouse, who states pt orders Onglyza from [facility] and it will be 2 weeks before he receives Rx. Pt is requesting that partial Rx to [facility] [place], as pt is currently out of medication.  Rx for 15 tablets has been sent to preferred pharmacy.  Charlett Nose states pt is currently in the process of creating a living well. Charlett Nose states a letter is needed from Dr. Lenna Gilford stating that pt is able to make decisions on his own.   SN please advise. Thanks.

## 2017-02-04 NOTE — Telephone Encounter (Signed)
SN has this note and will finish the letter for the pt by the end of the week

## 2017-02-16 ENCOUNTER — Telehealth: Payer: Self-pay | Admitting: Pulmonary Disease

## 2017-02-16 NOTE — Telephone Encounter (Signed)
Will close this encounter.  

## 2017-02-19 ENCOUNTER — Other Ambulatory Visit: Payer: Self-pay | Admitting: Pulmonary Disease

## 2017-03-09 ENCOUNTER — Telehealth: Payer: Self-pay | Admitting: Pulmonary Disease

## 2017-03-09 ENCOUNTER — Other Ambulatory Visit: Payer: Self-pay | Admitting: Pulmonary Disease

## 2017-03-09 MED ORDER — ATORVASTATIN CALCIUM 80 MG PO TABS: 80.0000 mg | ORAL_TABLET | Freq: Every day | ORAL | 3 refills | Status: DC

## 2017-03-09 NOTE — Telephone Encounter (Signed)
Pt needs 90 day supply of atorvastatin 80mg  tablet per [facility]. Take 1 tablet by mouth every day at 6pm.

## 2017-03-14 ENCOUNTER — Other Ambulatory Visit: Payer: Self-pay | Admitting: Pulmonary Disease

## 2017-03-16 ENCOUNTER — Telehealth (HOSPITAL_COMMUNITY): Payer: Self-pay

## 2017-03-16 NOTE — Telephone Encounter (Signed)
Called to schedule 1 yr f/u mri, left message for pt to return call. AW

## 2017-03-29 DIAGNOSIS — H2512 Age-related nuclear cataract, left eye: Secondary | ICD-10-CM | POA: Diagnosis not present

## 2017-03-30 ENCOUNTER — Telehealth: Payer: Self-pay | Admitting: Pulmonary Disease

## 2017-03-30 DIAGNOSIS — H2511 Age-related nuclear cataract, right eye: Secondary | ICD-10-CM | POA: Diagnosis not present

## 2017-03-30 NOTE — Telephone Encounter (Signed)
Spoke with pt's daughter, Santiago Glad. They are needing another copy of the letter that SN wrote for pt's living will. The original letter that was written and mailed in November was never received. This letter is not in the pt's chart. The letter will need to include that the pt is not able to make his own decisions when it comes to his health care.  SN - please advise. Thanks.

## 2017-03-31 ENCOUNTER — Other Ambulatory Visit: Payer: Self-pay | Admitting: Pulmonary Disease

## 2017-04-02 NOTE — Telephone Encounter (Signed)
[place] any updates on letter?

## 2017-04-09 ENCOUNTER — Encounter: Payer: Self-pay | Admitting: Pulmonary Disease

## 2017-04-09 NOTE — Telephone Encounter (Signed)
Letter done  Spoke with the Santiago Glad and notified her that this was done  She states to mail her the letter, and I have done so after verifying the address  Nothing further needed

## 2017-04-21 ENCOUNTER — Other Ambulatory Visit: Payer: Self-pay | Admitting: Pulmonary Disease

## 2017-05-17 ENCOUNTER — Ambulatory Visit: Payer: Medicare Other | Admitting: Neurology

## 2017-05-21 ENCOUNTER — Encounter: Payer: Self-pay | Admitting: Neurology

## 2017-05-21 ENCOUNTER — Ambulatory Visit: Payer: Medicare Other | Admitting: Neurology

## 2017-05-21 VITALS — BP 164/66 | HR 63 | Ht 66.0 in | Wt 173.0 lb

## 2017-05-21 DIAGNOSIS — F0390 Unspecified dementia without behavioral disturbance: Secondary | ICD-10-CM

## 2017-05-21 DIAGNOSIS — F03A0 Unspecified dementia, mild, without behavioral disturbance, psychotic disturbance, mood disturbance, and anxiety: Secondary | ICD-10-CM

## 2017-05-21 MED ORDER — DONEPEZIL HCL 10 MG PO TABS: 10.0000 mg | ORAL_TABLET | Freq: Every day | ORAL | 3 refills | Status: DC

## 2017-05-21 NOTE — Progress Notes (Signed)
NEUROLOGY FOLLOW UP OFFICE NOTE  Peter Nicholson [id number]  DOB: Jul 12, 1940  HISTORY OF PRESENT ILLNESS: I had the pleasure of seeing Peter Nicholson in follow-up in the neurology clinic on 05/21/2017.  He is again accompanied by his wife who helps supplement the history today. The patient was last seen a year ago for worsening memory. MMSE in January 2018 was 22/30 (24/30 in April 2017). He is taking Aricept 10mg  daily without side effects. He feels his memory is pretty good. His wife feels it may be a little worse. They deny getting lost driving, she is always with him and denies any driving concerns. He is independent with dressing and bathing. His wife is in charge of medications and finances. She reports difficulties with which tools to use when fixing things in the house. She states he sits all day and does not drink enough water. No personality changes or hallucinations. He denies any headaches, vision changes, focal numbness/tingling/weakness, bowel/bladder dysfunction, no falls.  HPI: This is a pleasant 77 yo RH man with vascular risk factors including hypertension, diabetes, hyperlipidemia, peripheral vascular disease, TIA in 2012, and left MCA watershed infarcts secondary to left ICA stenosis, presenting for evaluation of worsening memory. He had previously seen neurologist Dr. Leonie Man in 2014 with his wife reporting mild memory difficulties since around 2012. He had mostly short-term memory difficulties, needing to be told several times and not remembering. His MMSE at that time was 27/30. His wife reports that after his strokes in November and December 2015, memory has been much worse. Hospital records were reviewed, in November he presented with aphasia, MRI showed left frontal and parietal watershed infarcts. His carotid angiogram showed severe preocclusive stenosis of the left proximal ICA, 80-85% stenosis of the right ICA proximal cavernous segment, 80% stenosis of the dominant left vertebral  basilar junction just proximal to the basilar artery. He was discharged on aspirin and Plavix. He returned to the ER a month later with confusion, anomia, and ataxia. MRI showed multiple ischemic infarcts in the left frontal and parietal lobes, as well as the posterior left temporal lobe at the temporoparietal junction, cerebral angiogram showed interval progression of stenosis of left ICA distal to origin of ophthalmic artery with severe near complete occlusion of proximal cavernous left ICA, 70% stenosis of proximal cavernous right ICA, and 60% stenosis of dominant left vertebral artery at origin. There was unsuccessful attempt at revascularization of occluded left ICA cavernous segment, and note of multiple collaterals in the left ICA cavernous occluded segment. He was discharged home with SBP goal between 130 to 160 and control of vascular risk factors.   He feels his memory is "average, there are times I cannot remember." His wife reports he is not remembering things he used to know to do, such as how to fix things around the house and which are the right tools to use. He has had word-finding difficulties since the stroke, his wife notices this once in a while. He forgets to take his medications, his wife now sets this out for him. He denies getting lost driving, his wife is concerned "somewhat," he occasionally cannot recall the way to go somewhere. He occasionally gets mad and frustrated, but no paranoia. He denies any alcohol or tobacco use, no significant head injuries. His mother had memory changes in her 71s.   PAST MEDICAL HISTORY: Past Medical History:  Diagnosis Date  . Anxiety state, unspecified   . Blood transfusion without reported diagnosis   . Bowel  obstruction ([place])   . Calculus of kidney   . Cerebrovascular disease, unspecified   . Diaphragmatic hernia without mention of obstruction or gangrene   . Diverticulosis of colon (without mention of hemorrhage)   . Esophageal reflux   .  Gout, unspecified   . History of gallstones   . History of seizures   . HTN (hypertension)   . IBS (irritable bowel syndrome)   . Kidney carcinoma ([place])   . Lumbago   . Obstructive sleep apnea (adult) (pediatric)    doesnt wear CPAP  . Osteoarthrosis, unspecified whether generalized or localized, unspecified site    tendonitis  . Other specified congenital anomaly of kidney   . Overweight(278.02)   . Pure hypercholesterolemia   . Seizures ([place])    last seizure November 2012  . Stroke ([place])    2001  . Type II or unspecified type diabetes mellitus without mention of complication, not stated as uncontrolled   . Unspecified venous (peripheral) insufficiency     MEDICATIONS: Current Outpatient Medications on File Prior to Visit  Medication Sig Dispense Refill  . ALPRAZolam (XANAX) 0.5 MG tablet TAKE 0.5-1 TABLET BY MOUTH THREE TIMES ADAY 90 tablet 5  . aspirin EC 81 MG EC tablet Take 1 tablet (81 mg total) by mouth daily. 30 tablet 0  . atorvastatin (LIPITOR) 80 MG tablet Take 1 tablet (80 mg total) by mouth daily at 6 PM. 90 tablet 3  . clopidogrel (PLAVIX) 75 MG tablet TAKE 1 TABLET BY MOUTH EVERY DAY 90 tablet 3  . donepezil (ARICEPT) 10 MG tablet Take 1 tablet (10 mg total) by mouth daily. 90 tablet 3  . glipiZIDE (GLUCOTROL XL) 5 MG 24 hr tablet Take 1 tablet (5 mg total) by mouth daily with breakfast. 90 tablet 1  . Insulin Pen Needle (B-D ULTRAFINE III SHORT PEN) 31G X 8 MM MISC Use with the lantus solostar pen as directed 50 each 0  . LANTUS SOLOSTAR 100 UNIT/ML Solostar Pen INJECT 10 UNITS INTO THE SKIN AT BEDTIME. (Patient taking differently: INJECT 15 UNITS INTO THE SKIN AT BEDTIME.) 3 mL 5  . losartan (COZAAR) 50 MG tablet TAKE 1 TABLET (50 MG TOTAL) BY MOUTH DAILY. 90 tablet 1  . metFORMIN (GLUCOPHAGE) 500 MG tablet TAKE 2 TABLETS TWICE A DAY 360 tablet 2  . ONE TOUCH ULTRA TEST test strip USE AS INSTRUCTED 100 each 2  . pantoprazole (PROTONIX) 40 MG tablet TAKE 1 TABLET  (40 MG TOTAL) BY MOUTH DAILY. 90 tablet 0  . saxagliptin HCl (ONGLYZA) 5 MG TABS tablet Take 1 tablet (5 mg total) by mouth daily. 15 tablet 0  . sertraline (ZOLOFT) 50 MG tablet TAKE 1 TABLET (50 MG TOTAL) BY MOUTH AT BEDTIME. 90 tablet 0   No current facility-administered medications on file prior to visit.     ALLERGIES: Allergies  Allergen Reactions  . Dilaudid [Hydromorphone Hcl] Other (See Comments)    hallucinations  . Fentanyl Other (See Comments)    hallucinations    FAMILY HISTORY: Family History  Problem Relation Age of Onset  . Heart attack Father   . Dementia Mother   . Ovarian cancer Paternal Grandmother   . Breast cancer Sister   . Colon cancer Neg Hx   . Esophageal cancer Neg Hx   . Rectal cancer Neg Hx   . Stomach cancer Neg Hx     SOCIAL HISTORY: Social History   Socioeconomic History  . Marital status: Married    Spouse  name: Charlett Nose  . Number of children: 2  . Years of education: Not on file  . Highest education level: Not on file  Social Needs  . Financial resource strain: Not on file  . Food insecurity - worry: Not on file  . Food insecurity - inability: Not on file  . Transportation needs - medical: Not on file  . Transportation needs - non-medical: Not on file  Occupational History  . Occupation: Art gallery manager    Comment: still working  Tobacco Use  . Smoking status: Never Smoker  . Smokeless tobacco: Never Used  Substance and Sexual Activity  . Alcohol use: No    Alcohol/week: 0.0 oz  . Drug use: No  . Sexual activity: No  Other Topics Concern  . Not on file  Social History Narrative   Patient drinks 4 cups of a caffinated Drinks week.    REVIEW OF SYSTEMS: Constitutional: No fevers, chills, or sweats, no generalized fatigue, change in appetite Eyes: No visual changes, double vision, eye pain Ear, nose and throat: No hearing loss, ear pain, nasal congestion, sore throat Cardiovascular: No chest pain, palpitations Respiratory:  No  shortness of breath at rest or with exertion, wheezes GastrointestinaI: No nausea, vomiting, diarrhea, abdominal pain, fecal incontinence Genitourinary:  No dysuria, urinary retention or frequency Musculoskeletal:  No neck pain, back pain Integumentary: No rash, pruritus, skin lesions Neurological: as above Psychiatric: No depression, insomnia, anxiety Endocrine: No palpitations, fatigue, diaphoresis, mood swings, change in appetite, change in weight, increased thirst Hematologic/Lymphatic:  No anemia, purpura, petechiae. Allergic/Immunologic: no itchy/runny eyes, nasal congestion, recent allergic reactions, rashes  PHYSICAL EXAM: Vitals:   05/21/17 1401  BP: (!) 164/66  Pulse: 63  SpO2: 97%   General: No acute distress Head:  Normocephalic/atraumatic Neck: supple, no paraspinal tenderness, full range of motion Heart:  Regular rate and rhythm Lungs:  Clear to auscultation bilaterally Back: No paraspinal tenderness Skin/Extremities: No rash, no edema Neurological Exam: alert and oriented to person, place, month/year. No aphasia or dysarthria. Fund of knowledge is appropriate.  Recent and remote memory are impaired.  Attention and concentration are reduced    Able to name objects and repeat phrases. CDT 4/5 MMSE - Mini Mental State Exam 05/21/2017 05/13/2016 08/02/2015  Orientation to time 2 3 3   Orientation to Place 4 4 5   Registration 3 3 3   Attention/ Calculation 3 3 4   Recall 1 0 0  Language- name 2 objects 2 2 2   Language- repeat 1 1 1   Language- follow 3 step command 3 3 3   Language- read & follow direction 1 1 1   Write a sentence 1 1 1   Copy design 1 1 1   Total score 22 22 24    Cranial Nerves: Pupils equal, round, reactive to light. Extraocular movements intact with no nystagmus. Visual fields full. Facial sensation intact. No facial asymmetry. Tongue, uvula, palate midline.  Motor: Bulk and tone normal, muscle strength 5/5 throughout with no pronator drift.  Sensation to  light touch intact.  No extinction to double simultaneous stimulation.  Deep tendon reflexes +1 throughout, toes downgoing.  Finger to nose testing intact.  Gait narrow-based and steady, able to tandem walk adequately.  Romberg negative.  IMPRESSION: This is a 77 yo RH man with vascular risk factors including hypertension, diabetes, hyperlipidemia, peripheral vascular disease with 2 strokes in Nov/December 2015 due to left ICA occlusion, intracranial atherosclerosis, previous TIA in 2012, with mild dementia, likely vascular in etiology. MMSE today 22/30 (22/30 in January 2018,  24/30 in April 2017). Continue Aricept 10mg  daily. Continue to monitor driving. We again discussed the importance of control of vascular risk factors and secondary stroke prevention, continue dual therapy with aspirin and Plavix. They know to go to ER immediately for any change in symptoms. The importance of physical exercise and brain stimulation exercises for brain health were again discussed. He will follow-up in 1 year and knows to call for any changes.   Thank you for allowing me to participate in his care.  Please do not hesitate to call for any questions or concerns.  The duration of this appointment visit was 25 minutes of face-to-face time with the patient.  Greater than 50% of this time was spent in counseling, explanation of diagnosis, planning of further management, and coordination of care.   Ellouise Newer, [name]   CC: Dr. Lenna Gilford

## 2017-05-21 NOTE — Patient Instructions (Signed)
1. Continue Aricept 10mg  daily 2. Have a hearing test done 3. Follow-up in 1 year, call for any changes  FALL PRECAUTIONS: Be cautious when walking. Scan the area for obstacles that may increase the risk of trips and falls. When getting up in the mornings, sit up at the edge of the bed for a few minutes before getting out of bed. Consider elevating the bed at the head end to avoid drop of blood pressure when getting up. Walk always in a well-lit room (use night lights in the walls). Avoid area rugs or power cords from appliances in the middle of the walkways. Use a walker or a cane if necessary and consider physical therapy for balance exercise. Get your eyesight checked regularly.  FINANCIAL OVERSIGHT: Supervision, especially oversight when making financial decisions or transactions is also recommended.  HOME SAFETY: Consider the safety of the kitchen when operating appliances like stoves, microwave oven, and blender. Consider having supervision and share cooking responsibilities until no longer able to participate in those. Accidents with firearms and other hazards in the house should be identified and addressed as well.  DRIVING: Regarding driving, in patients with progressive memory problems, driving will be impaired. We advise to have someone else do the driving if trouble finding directions or if minor accidents are reported. Independent driving assessment is available to determine safety of driving.  ABILITY TO BE LEFT ALONE: If patient is unable to contact 911 operator, consider using LifeLine, or when the need is there, arrange for someone to stay with patients. Smoking is a fire hazard, consider supervision or cessation. Risk of wandering should be assessed by caregiver and if detected at any point, supervision and safe proof recommendations should be instituted.  MEDICATION SUPERVISION: Inability to self-administer medication needs to be constantly addressed. Implement a mechanism to ensure  safe administration of the medications.  RECOMMENDATIONS FOR ALL PATIENTS WITH MEMORY PROBLEMS: 1. Continue to exercise (Recommend 30 minutes of walking everyday, or 3 hours every week) 2. Increase social interactions - continue going to [place] and enjoy social gatherings with friends and family 3. Eat healthy, avoid fried foods and eat more fruits and vegetables 4. Maintain adequate blood pressure, blood sugar, and blood cholesterol level. Reducing the risk of stroke and cardiovascular disease also helps promoting better memory. 5. Avoid stressful situations. Live a simple life and avoid aggravations. Organize your time and prepare for the next day in anticipation. 6. Sleep well, avoid any interruptions of sleep and avoid any distractions in the bedroom that may interfere with adequate sleep quality 7. Avoid sugar, avoid sweets as there is a strong link between excessive sugar intake, diabetes, and cognitive impairment The Mediterranean diet has been shown to help patients reduce the risk of progressive memory disorders and reduces cardiovascular risk. This includes eating fish, eat fruits and green leafy vegetables, nuts like almonds and hazelnuts, walnuts, and also use olive oil. Avoid fast foods and fried foods as much as possible. Avoid sweets and sugar as sugar use has been linked to worsening of memory function.  There is always a concern of gradual progression of memory problems. If this is the case, then we may need to adjust level of care according to patient needs. Support, both to the patient and caregiver, should then be put into place.

## 2017-05-25 ENCOUNTER — Other Ambulatory Visit: Payer: Self-pay | Admitting: *Deleted

## 2017-05-25 MED ORDER — SERTRALINE HCL 50 MG PO TABS: 50.0000 mg | ORAL_TABLET | Freq: Every day | ORAL | 0 refills | Status: DC

## 2017-05-28 ENCOUNTER — Encounter: Payer: Self-pay | Admitting: Neurology

## 2017-06-10 ENCOUNTER — Ambulatory Visit: Payer: Medicare Other | Admitting: Pulmonary Disease

## 2017-06-27 ENCOUNTER — Emergency Department (HOSPITAL_COMMUNITY): Payer: Medicare Other

## 2017-06-27 ENCOUNTER — Other Ambulatory Visit: Payer: Self-pay

## 2017-06-27 ENCOUNTER — Encounter (HOSPITAL_COMMUNITY): Payer: Self-pay | Admitting: Emergency Medicine

## 2017-06-27 ENCOUNTER — Observation Stay (HOSPITAL_COMMUNITY)
Admission: EM | Admit: 2017-06-27 | Discharge: 2017-06-29 | Disposition: A | Discharge status: Home / Self Care | Payer: Medicare Other | Attending: Family Medicine | Admitting: Family Medicine

## 2017-06-27 DIAGNOSIS — D638 Anemia in other chronic diseases classified elsewhere: Secondary | ICD-10-CM

## 2017-06-27 DIAGNOSIS — E119 Type 2 diabetes mellitus without complications: Secondary | ICD-10-CM | POA: Insufficient documentation

## 2017-06-27 DIAGNOSIS — N183 Chronic kidney disease, stage 3 unspecified: Secondary | ICD-10-CM | POA: Diagnosis present

## 2017-06-27 DIAGNOSIS — Z8673 Personal history of transient ischemic attack (TIA), and cerebral infarction without residual deficits: Secondary | ICD-10-CM

## 2017-06-27 DIAGNOSIS — E118 Type 2 diabetes mellitus with unspecified complications: Secondary | ICD-10-CM

## 2017-06-27 DIAGNOSIS — E1165 Type 2 diabetes mellitus with hyperglycemia: Secondary | ICD-10-CM

## 2017-06-27 DIAGNOSIS — Z79899 Other long term (current) drug therapy: Secondary | ICD-10-CM | POA: Insufficient documentation

## 2017-06-27 DIAGNOSIS — R2689 Other abnormalities of gait and mobility: Secondary | ICD-10-CM | POA: Insufficient documentation

## 2017-06-27 DIAGNOSIS — G4733 Obstructive sleep apnea (adult) (pediatric): Secondary | ICD-10-CM | POA: Diagnosis not present

## 2017-06-27 DIAGNOSIS — R079 Chest pain, unspecified: Secondary | ICD-10-CM | POA: Diagnosis not present

## 2017-06-27 DIAGNOSIS — M6281 Muscle weakness (generalized): Secondary | ICD-10-CM | POA: Diagnosis not present

## 2017-06-27 DIAGNOSIS — Z794 Long term (current) use of insulin: Secondary | ICD-10-CM | POA: Diagnosis not present

## 2017-06-27 DIAGNOSIS — R0600 Dyspnea, unspecified: Secondary | ICD-10-CM | POA: Diagnosis not present

## 2017-06-27 DIAGNOSIS — I129 Hypertensive chronic kidney disease with stage 1 through stage 4 chronic kidney disease, or unspecified chronic kidney disease: Secondary | ICD-10-CM | POA: Diagnosis not present

## 2017-06-27 DIAGNOSIS — Z7982 Long term (current) use of aspirin: Secondary | ICD-10-CM | POA: Diagnosis not present

## 2017-06-27 DIAGNOSIS — F0390 Unspecified dementia without behavioral disturbance: Secondary | ICD-10-CM | POA: Diagnosis present

## 2017-06-27 DIAGNOSIS — R4020 Unspecified coma: Secondary | ICD-10-CM

## 2017-06-27 DIAGNOSIS — Z7902 Long term (current) use of antithrombotics/antiplatelets: Secondary | ICD-10-CM | POA: Diagnosis not present

## 2017-06-27 DIAGNOSIS — R55 Syncope and collapse: Secondary | ICD-10-CM | POA: Diagnosis not present

## 2017-06-27 DIAGNOSIS — D631 Anemia in chronic kidney disease: Secondary | ICD-10-CM | POA: Diagnosis not present

## 2017-06-27 DIAGNOSIS — I10 Essential (primary) hypertension: Secondary | ICD-10-CM | POA: Diagnosis present

## 2017-06-27 LAB — BASIC METABOLIC PANEL
Anion gap: 9 (ref 5–15)
BUN: 24 mg/dL — AB (ref 6–20)
CALCIUM: 9.4 mg/dL (ref 8.9–10.3)
CO2: 21 mmol/L — ABNORMAL LOW (ref 22–32)
CREATININE: 1.54 mg/dL — AB (ref 0.61–1.24)
Chloride: 108 mmol/L (ref 101–111)
GFR calc Af Amer: 48 mL/min — ABNORMAL LOW (ref >60)
GFR, EST NON AFRICAN AMERICAN: 42 mL/min — AB (ref >60)
GLUCOSE: 218 mg/dL — AB (ref 65–99)
Potassium: 4.2 mmol/L (ref 3.5–5.1)
SODIUM: 138 mmol/L (ref 135–145)

## 2017-06-27 LAB — CBG MONITORING, ED
GLUCOSE-CAPILLARY: 189 mg/dL — AB (ref 65–99)
Glucose-Capillary: 115 mg/dL — ABNORMAL HIGH (ref 65–99)
Glucose-Capillary: 122 mg/dL — ABNORMAL HIGH (ref 65–99)

## 2017-06-27 LAB — CBC
HEMATOCRIT: 33.8 % — AB (ref 39.0–52.0)
Hemoglobin: 11 g/dL — ABNORMAL LOW (ref 13.0–17.0)
MCH: 31.3 pg (ref 26.0–34.0)
MCHC: 32.5 g/dL (ref 30.0–36.0)
MCV: 96.3 fL (ref 78.0–100.0)
Platelets: 217 10*3/uL (ref 150–400)
RBC: 3.51 MIL/uL — ABNORMAL LOW (ref 4.22–5.81)
RDW: 12.7 % (ref 11.5–15.5)
WBC: 9.9 10*3/uL (ref 4.0–10.5)

## 2017-06-27 LAB — I-STAT TROPONIN, ED: TROPONIN I, POC: 0.02 ng/mL (ref 0.00–0.08)

## 2017-06-27 LAB — GLUCOSE, CAPILLARY
GLUCOSE-CAPILLARY: 134 mg/dL — AB (ref 65–99)
Glucose-Capillary: 120 mg/dL — ABNORMAL HIGH (ref 65–99)

## 2017-06-27 LAB — ETHANOL: Alcohol, Ethyl (B): <10 mg/dL (ref <10)

## 2017-06-27 LAB — SALICYLATE LEVEL

## 2017-06-27 LAB — ACETAMINOPHEN LEVEL: Acetaminophen (Tylenol), Serum: <10 ug/mL — ABNORMAL LOW (ref 10–30)

## 2017-06-27 MED ORDER — IBUPROFEN 200 MG PO TABS
200.0000 mg | ORAL_TABLET | Freq: Four times a day (QID) | ORAL | Status: DC | PRN
  Filled 2017-06-27: qty 2

## 2017-06-27 MED ORDER — SENNOSIDES-DOCUSATE SODIUM 8.6-50 MG PO TABS: 1.0000 | ORAL_TABLET | Freq: Every evening | ORAL | Status: DC | PRN

## 2017-06-27 MED ORDER — SODIUM CHLORIDE 0.9 % IV BOLUS (SEPSIS)
500.0000 mL | Freq: Once | INTRAVENOUS | Status: AC
  Administered 2017-06-27: 500 mL via INTRAVENOUS

## 2017-06-27 MED ORDER — BISACODYL 5 MG PO TBEC: 5.0000 mg | DELAYED_RELEASE_TABLET | Freq: Every day | ORAL | Status: DC | PRN

## 2017-06-27 MED ORDER — INSULIN ASPART 100 UNIT/ML ~~LOC~~ SOLN
0.0000 [IU] | Freq: Three times a day (TID) | SUBCUTANEOUS | Status: DC
  Administered 2017-06-28 (×2): 1 [IU] via SUBCUTANEOUS

## 2017-06-27 MED ORDER — HYDRALAZINE HCL 20 MG/ML IJ SOLN: 5.0000 mg | INTRAMUSCULAR | Status: DC | PRN

## 2017-06-27 MED ORDER — HEPARIN SODIUM (PORCINE) 5000 UNIT/ML IJ SOLN
5000.0000 [IU] | Freq: Three times a day (TID) | INTRAMUSCULAR | Status: DC
  Administered 2017-06-27 – 2017-06-29 (×6): 5000 [IU] via SUBCUTANEOUS
  Filled 2017-06-27 (×6): qty 1

## 2017-06-27 MED ORDER — LOSARTAN POTASSIUM 50 MG PO TABS
50.0000 mg | ORAL_TABLET | Freq: Every day | ORAL | Status: DC
  Administered 2017-06-27 – 2017-06-29 (×3): 50 mg via ORAL
  Filled 2017-06-27 (×4): qty 1

## 2017-06-27 MED ORDER — SODIUM CHLORIDE 0.9% FLUSH: 3.0000 mL | INTRAVENOUS | Status: DC | PRN

## 2017-06-27 MED ORDER — SODIUM CHLORIDE 0.9 % IV SOLN: 250.0000 mL | INTRAVENOUS | Status: DC | PRN

## 2017-06-27 MED ORDER — ONDANSETRON HCL 4 MG/2ML IJ SOLN
4.0000 mg | Freq: Once | INTRAMUSCULAR | Status: AC
  Administered 2017-06-27: 4 mg via INTRAVENOUS
  Filled 2017-06-27: qty 2

## 2017-06-27 MED ORDER — ACETAMINOPHEN 650 MG RE SUPP: 650.0000 mg | Freq: Four times a day (QID) | RECTAL | Status: DC | PRN

## 2017-06-27 MED ORDER — ACETAMINOPHEN 325 MG PO TABS: 650.0000 mg | ORAL_TABLET | Freq: Four times a day (QID) | ORAL | Status: DC | PRN

## 2017-06-27 MED ORDER — DONEPEZIL HCL 10 MG PO TABS
10.0000 mg | ORAL_TABLET | Freq: Every day | ORAL | Status: DC
  Administered 2017-06-27 – 2017-06-29 (×3): 10 mg via ORAL
  Filled 2017-06-27 (×4): qty 1

## 2017-06-27 MED ORDER — ATORVASTATIN CALCIUM 80 MG PO TABS
80.0000 mg | ORAL_TABLET | Freq: Every day | ORAL | Status: DC
  Administered 2017-06-27 – 2017-06-28 (×2): 80 mg via ORAL
  Filled 2017-06-27 (×3): qty 1

## 2017-06-27 MED ORDER — SODIUM CHLORIDE 0.9% FLUSH
3.0000 mL | Freq: Two times a day (BID) | INTRAVENOUS | Status: DC
  Administered 2017-06-27 – 2017-06-29 (×4): 3 mL via INTRAVENOUS

## 2017-06-27 MED ORDER — INSULIN GLARGINE 100 UNIT/ML ~~LOC~~ SOLN
7.0000 [IU] | Freq: Every day | SUBCUTANEOUS | Status: DC
  Administered 2017-06-27 – 2017-06-28 (×2): 7 [IU] via SUBCUTANEOUS
  Filled 2017-06-27 (×3): qty 0.07

## 2017-06-27 MED ORDER — CLOPIDOGREL BISULFATE 75 MG PO TABS
75.0000 mg | ORAL_TABLET | Freq: Every day | ORAL | Status: DC
  Administered 2017-06-27 – 2017-06-29 (×3): 75 mg via ORAL
  Filled 2017-06-27 (×3): qty 1

## 2017-06-27 MED ORDER — PANTOPRAZOLE SODIUM 40 MG PO TBEC
40.0000 mg | DELAYED_RELEASE_TABLET | Freq: Every day | ORAL | Status: DC
  Administered 2017-06-27 – 2017-06-29 (×3): 40 mg via ORAL
  Filled 2017-06-27 (×3): qty 1

## 2017-06-27 MED ORDER — SERTRALINE HCL 50 MG PO TABS
50.0000 mg | ORAL_TABLET | Freq: Every day | ORAL | Status: DC
  Administered 2017-06-27 – 2017-06-28 (×2): 50 mg via ORAL
  Filled 2017-06-27 (×2): qty 1

## 2017-06-27 MED ORDER — ONDANSETRON HCL 4 MG/2ML IJ SOLN: 4.0000 mg | Freq: Four times a day (QID) | INTRAMUSCULAR | Status: DC | PRN

## 2017-06-27 MED ORDER — ASPIRIN EC 81 MG PO TBEC
81.0000 mg | DELAYED_RELEASE_TABLET | Freq: Every day | ORAL | Status: DC
  Administered 2017-06-27 – 2017-06-29 (×3): 81 mg via ORAL
  Filled 2017-06-27 (×3): qty 1

## 2017-06-27 MED ORDER — INSULIN ASPART 100 UNIT/ML ~~LOC~~ SOLN: 0.0000 [IU] | Freq: Every day | SUBCUTANEOUS | Status: DC

## 2017-06-27 MED ORDER — ALPRAZOLAM 0.25 MG PO TABS: 0.5000 mg | ORAL_TABLET | Freq: Three times a day (TID) | ORAL | Status: DC | PRN

## 2017-06-27 MED ORDER — ONDANSETRON HCL 4 MG PO TABS: 4.0000 mg | ORAL_TABLET | Freq: Four times a day (QID) | ORAL | Status: DC | PRN

## 2017-06-27 MED ORDER — SODIUM CHLORIDE 0.9% FLUSH
3.0000 mL | Freq: Two times a day (BID) | INTRAVENOUS | Status: DC
  Administered 2017-06-28 – 2017-06-29 (×2): 3 mL via INTRAVENOUS

## 2017-06-27 MED ORDER — SODIUM CHLORIDE 0.9 % IV BOLUS (SEPSIS)
1000.0000 mL | Freq: Once | INTRAVENOUS | Status: AC
  Administered 2017-06-27: 1000 mL via INTRAVENOUS

## 2017-06-27 NOTE — H&P (Signed)
History and Physical    Peter Nicholson JJO:[id number] DOB: 1940/08/18 DOA: 06/27/2017  PCP: Noralee Space, MD   Patient coming from: Home  Chief Complaint: Loss of consciousness   HPI: Peter Nicholson is a 77 y.o. male with medical history significant for history of CVA, dementia, chronic kidney disease stage III, and type 2 diabetes mellitus, now presenting to the emergency department after being found unresponsive by his wife.  Patient was reportedly having an uneventful day with no complaints, was helping his wife clean up late at night, went to the bathroom, and was found very shortly after this to be unresponsive on the bathroom floor by his wife.  She started to do chest compressions, but he quickly regained alertness.  There was no incontinence, tongue bite, shaking, or postictal.  He seemed to return to his baseline very rapidly.  He had never experienced similar symptoms previously.  Denies chest pain, palpitations, shortness of breath, cough, leg swelling, or leg tenderness.  ED Course: Upon arrival to the ED, patient is found to be afebrile, saturating well on room air, hypertensive to 190/70, and vitals otherwise normal.  EKG features a sinus rhythm with sinus arrhythmia.  Chest x-ray is negative for acute cardiopulmonary disease.  Noncontrast head CT is negative for acute intracranial abnormality.  Chemistry panel is notable for a creatinine of 1.54, slightly up from his apparent baseline.  CBC features a stable chronic normocytic anemia with hemoglobin of 11.0.  Troponin is normal.  Patient was given 1.5 L of normal saline and Zofran in the ED.  He remains hemodynamically stable, in no apparent distress, and will be observed in the telemetry unit for evaluation and management of a suspected syncopal episode.  Review of Systems:  All other systems reviewed and apart from HPI, are negative.  Past Medical History:  Diagnosis Date  . Anxiety state, unspecified   . Blood transfusion  without reported diagnosis   . Bowel obstruction ([place])   . Calculus of kidney   . Cerebrovascular disease, unspecified   . Diaphragmatic hernia without mention of obstruction or gangrene   . Diverticulosis of colon (without mention of hemorrhage)   . Esophageal reflux   . Gout, unspecified   . History of gallstones   . History of seizures   . HTN (hypertension)   . IBS (irritable bowel syndrome)   . Kidney carcinoma ([place])   . Lumbago   . Obstructive sleep apnea (adult) (pediatric)    doesnt wear CPAP  . Osteoarthrosis, unspecified whether generalized or localized, unspecified site    tendonitis  . Other specified congenital anomaly of kidney   . Overweight(278.02)   . Pure hypercholesterolemia   . Seizures ([place])    last seizure November 2012  . Stroke ([place])    2001  . Type II or unspecified type diabetes mellitus without mention of complication, not stated as uncontrolled   . Unspecified venous (peripheral) insufficiency     Past Surgical History:  Procedure Laterality Date  . CAROTID ENDARTERECTOMY  09/2000   Dr.Lawson  . CHOLECYSTECTOMY  9/04   Dr. Rebekah Chesterfield  . EXTERNAL FIXATION LEG  03/10/2011   Procedure: EXTERNAL FIXATION LEG;  Surgeon: Rozanna Box;  Location: [facility];  Service: Orthopedics;  Laterality: Right;  . INCISIONAL HERNIA REPAIR  1/96   Dr.Leone  . NASAL SINUS SURGERY  12/96   Dr.Redman  . 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 ORIF TIBIA PLATEAU  03/24/2011   Procedure: OPEN REDUCTION INTERNAL FIXATION (ORIF) TIBIAL PLATEAU;  Surgeon: Rozanna Box;  Location: [facility];  Service: Orthopedics;  Laterality: Right;  . RADIOLOGY WITH ANESTHESIA N/A 04/12/2014   Procedure: ANGIOPLASTY;  Surgeon: Rob Hickman, MD;  Location: [facility];  Service: Radiology;  Laterality: N/A;  . SMALL INTESTINE SURGERY    . veterbral art angioplasty     x2     reports that  has never smoked. he has never used smokeless tobacco. He reports that he  does not drink alcohol or use drugs.  Allergies  Allergen Reactions  . Dilaudid [Hydromorphone Hcl] Other (See Comments)    hallucinations  . Fentanyl Other (See Comments)    hallucinations    Family History  Problem Relation Age of Onset  . Heart attack Father   . Dementia Mother   . Ovarian cancer Paternal Grandmother   . Breast cancer Sister   . Colon cancer Neg Hx   . Esophageal cancer Neg Hx   . Rectal cancer Neg Hx   . Stomach cancer Neg Hx      Prior to Admission medications   Medication Sig Start Date End Date Taking? Authorizing Provider  ALPRAZolam Duanne Moron) 0.5 MG tablet TAKE 0.5-1 TABLET BY MOUTH THREE TIMES ADAY 04/22/17   Noralee Space, MD  aspirin EC 81 MG EC tablet Take 1 tablet (81 mg total) by mouth daily. 02/28/14   Ghimire, Henreitta Leber, MD  atorvastatin (LIPITOR) 80 MG tablet Take 1 tablet (80 mg total) by mouth daily at 6 PM. 03/09/17   Noralee Space, MD  clopidogrel (PLAVIX) 75 MG tablet TAKE 1 TABLET BY MOUTH EVERY DAY 03/31/17   Noralee Space, MD  donepezil (ARICEPT) 10 MG tablet Take 1 tablet (10 mg total) by mouth daily. 05/21/17   Cameron Sprang, MD  glipiZIDE (GLUCOTROL XL) 5 MG 24 hr tablet Take 1 tablet (5 mg total) by mouth daily with breakfast. 11/04/16   Noralee Space, MD  Insulin Pen Needle (B-D ULTRAFINE III SHORT PEN) 31G X 8 MM MISC Use with the lantus solostar pen as directed 10/23/16   Noralee Space, MD  LANTUS SOLOSTAR 100 UNIT/ML Solostar Pen INJECT 10 UNITS INTO THE SKIN AT BEDTIME. Patient taking differently: INJECT 15 UNITS INTO THE SKIN AT BEDTIME. 08/11/16   Noralee Space, MD  losartan (COZAAR) 50 MG tablet TAKE 1 TABLET (50 MG TOTAL) BY MOUTH DAILY. 10/05/16   Noralee Space, MD  metFORMIN (GLUCOPHAGE) 500 MG tablet TAKE 2 TABLETS TWICE A DAY 09/21/16   Noralee Space, MD  ONE [facility] ULTRA TEST test strip USE AS INSTRUCTED 12/28/16   Noralee Space, MD  pantoprazole (PROTONIX) 40 MG tablet TAKE 1 TABLET (40 MG TOTAL) BY MOUTH DAILY. 03/15/17    Noralee Space, MD  saxagliptin HCl (ONGLYZA) 5 MG TABS tablet Take 1 tablet (5 mg total) by mouth daily. 02/02/17   Noralee Space, MD  sertraline (ZOLOFT) 50 MG tablet Take 1 tablet (50 mg total) by mouth at bedtime. 05/25/17   Noralee Space, MD    Physical Exam: Vitals:   06/27/17 0154  BP: (!) 193/72  Pulse: 70  Resp: 16  Temp: 98.3 F (36.8 C)  TempSrc: Oral  SpO2: 97%      Constitutional: NAD, calm  Eyes: PERTLA, lids and conjunctivae normal ENMT: Mucous membranes are moist. Posterior pharynx clear of any exudate or lesions.   Neck: normal, supple,  no masses, no thyromegaly Respiratory: clear to auscultation bilaterally, no wheezing, no crackles. Normal respiratory effort.    Cardiovascular: S1 & S2 heard, regular rate and rhythm. Trace pretibial edema bilaterally. No significant JVD. Abdomen: No distension, no tenderness, no masses palpated. Bowel sounds normal.  Musculoskeletal: no clubbing / cyanosis. No joint deformity upper and lower extremities.   Skin: no significant rashes, lesions, ulcers. Warm, dry, well-perfused. Neurologic: No facial asymmetry. Sensation intact. Strength 5/5 in all 4 limbs.  Psychiatric: Alert and oriented to person, place, and situation. Calm, cooperative.     Labs on Admission: I have personally reviewed following labs and imaging studies  CBC: Recent Labs  Lab 06/27/17 0156  WBC 9.9  HGB 11.0*  HCT 33.8*  MCV 96.3  PLT 607   Basic Metabolic Panel: Recent Labs  Lab 06/27/17 0156  NA 138  K 4.2  CL 108  CO2 21*  GLUCOSE 218*  BUN 24*  CREATININE 1.54*  CALCIUM 9.4   GFR: CrCl cannot be calculated (Unknown ideal weight.). Liver Function Tests: No results for input(s): AST, ALT, ALKPHOS, BILITOT, PROT, ALBUMIN in the last 168 hours. No results for input(s): LIPASE, AMYLASE in the last 168 hours. No results for input(s): AMMONIA in the last 168 hours. Coagulation Profile: No results for input(s): INR, PROTIME in the  last 168 hours. Cardiac Enzymes: No results for input(s): CKTOTAL, CKMB, CKMBINDEX, TROPONINI in the last 168 hours. BNP (last 3 results) No results for input(s): PROBNP in the last 8760 hours. HbA1C: No results for input(s): HGBA1C in the last 72 hours. CBG: Recent Labs  Lab 06/27/17 0226  GLUCAP 189*   Lipid Profile: No results for input(s): CHOL, HDL, LDLCALC, TRIG, CHOLHDL, LDLDIRECT in the last 72 hours. Thyroid Function Tests: No results for input(s): TSH, T4TOTAL, FREET4, T3FREE, THYROIDAB in the last 72 hours. Anemia Panel: No results for input(s): VITAMINB12, FOLATE, FERRITIN, TIBC, IRON, RETICCTPCT in the last 72 hours. Urine analysis:    Component Value Date/Time   COLORURINE YELLOW 04/08/2014 2234   APPEARANCEUR CLOUDY (A) 04/08/2014 2234   LABSPEC 1.028 04/08/2014 2234   PHURINE 5.5 04/08/2014 2234   GLUCOSEU >1000 (A) 04/08/2014 2234   GLUCOSEU >=1000 01/17/2013 1550   HGBUR SMALL (A) 04/08/2014 2234   BILIRUBINUR NEGATIVE 04/08/2014 2234   [place] 04/08/2014 2234   PROTEINUR 30 (A) 04/08/2014 2234   UROBILINOGEN 0.2 04/08/2014 2234   NITRITE NEGATIVE 04/08/2014 2234   LEUKOCYTESUR MODERATE (A) 04/08/2014 2234   Sepsis Labs: @LABRCNTIP (procalcitonin:4,lacticidven:4) )No results found for this or any previous visit (from the past 240 hour(s)).   Radiological Exams on Admission: Dg Chest 2 View  Result Date: 06/27/2017 CLINICAL DATA:  Chest pain. EXAM: CHEST - 2 VIEW COMPARISON:  08/22/2015 FINDINGS: The cardiomediastinal contours are normal. Mild aortic arch atherosclerosis, unchanged from prior exam. Pulmonary vasculature is normal. No consolidation, pleural effusion, or pneumothorax. No acute osseous abnormalities are seen. There is degenerative change in the thoracic spine. IMPRESSION: 1. No acute abnormality. 2.  Aortic Atherosclerosis (ICD10-I70.0). Electronically Signed   By: Jeb Levering [name]   On: 06/27/2017 03:26   Ct Head Wo  Contrast  Result Date: 06/27/2017 CLINICAL DATA:  Altered level of consciousness. EXAM: CT HEAD WITHOUT CONTRAST TECHNIQUE: Contiguous axial images were obtained from the base of the skull through the vertex without intravenous contrast. COMPARISON:  Brain MRI 03/20/2016 FINDINGS: Brain: Remote infarcts in the left frontal parietal lobes are unchanged from prior MRI. Unchanged degree of cerebral atrophy and  generalized chronic small vessel ischemia. Prior cerebellar infarcts on prior MRI are not well seen by CT. No intracranial hemorrhage, mass effect, or midline shift. No hydrocephalus. The basilar cisterns are patent. No evidence of territorial infarct or acute ischemia. No extra-axial or intracranial fluid collection. Vascular: Atherosclerosis of skullbase vasculature without hyperdense vessel or abnormal calcification. Skull: No fracture or focal lesion. Sinuses/Orbits: No acute finding. Other: None. IMPRESSION: 1.  No acute intracranial abnormality. 2. Unchanged remote infarcts, atrophy, and chronic small vessel ischemia. Electronically Signed   By: Jeb Levering [name]   On: 06/27/2017 03:30    EKG: Independently reviewed. Sinus rhythm with sinus arrhythmia.   Assessment/Plan  1. Syncope  - Went to restroom and found on floor unresponsive shortly after by wife; she started chest compressions but he regained alertness quickly  - There was no shaking, incontinence, postictal period, or tongue bite  - Head CT is negative for acute findings  - Denies chest pain or palpitations, denies LE swelling or tenderness  - Treated in ED with 1.5 L NS  - Check orthostatics (though already had 1.5 L bolus), continue cardiac monitoring, obtain echocardiogram    2. Hx of CVA  - No focal deficit appreciated, head CT neg for acute findings  - Continue statin, Plavix, ASA    3. Type II DM  - A1c was 6.4% in August 2018  - Managed at home with Lantus 10 units qHS, glipizide, and Onglyza  - Follow CBG's,  continue Lantus with 7 units qHS, and start SSI with Novolog while in hospital    4. Dementia  - Stable, continue Aricept    5. CKD stage III  - SCr is 1.54 on admission, slightly higher than apparent baseline  - Treated in ED with 1.5 liters NS  - Renally-dose medications, avoid nephrotoxins     DVT prophylaxis: sq heparin   Code Status: Full  Family Communication: Wife updated at bedside Consults called: None Admission status: Observation    Vianne Bulls, MD [facility] Pager [phone]  If 7PM-7AM, please contact night-coverage [email or web address] Password [email or web address]  06/27/2017, 5:06 AM

## 2017-06-27 NOTE — ED Notes (Signed)
Patient's wife called as promised to notify of transfer.

## 2017-06-27 NOTE — ED Notes (Signed)
Pt knows about UA need

## 2017-06-27 NOTE — ED Notes (Signed)
Carb modified breakfast tray ordered 

## 2017-06-27 NOTE — ED Triage Notes (Signed)
Per EMS pt from home. Was up with wife cleaning up after dog and went into bathroom. Wife didn't hear anything but found pt on floor. Unresponsive.  Pt wife could not tell if pt was breathing and began chest compressions. Pt began to respond.  Pt has history of dementia. Not alert to time and date but this is normal per wife.  Pt has no complaints at this time and did not want to come to the hospital.  Pt alert and oriented. VSS.

## 2017-06-27 NOTE — ED Notes (Signed)
Patient is having lunch

## 2017-06-27 NOTE — ED Provider Notes (Signed)
[facility] EMERGENCY DEPARTMENT Provider Note   CSN: [id number] Arrival date & time: 06/27/17  0154     History   Chief Complaint Chief Complaint  Patient presents with  . Loss of Consciousness    HPI Peter Nicholson is a 77 y.o. male.  Level 5 caveat for dementia.  Patient from home after apparent syncopal episode.  He and his wife are cleaning up after the dog and patient went into the bathroom.  He was found on the floor by his wife and not responding.  His wife could not tell if he was breathing and she started chest compressions when patient began to respond.  Patient is now alert and oriented to person and place.  He denies any complaints.  He denies any preceding dizziness or lightheadedness.  No chest pain or shortness of breath.  He did have one episode of vomiting after he received chest compressions.  He denies any abdominal pain.  No focal weakness, numbness or tingling.  He states he has been eating and drinking well and feels fine.  His wife is not here.   The history is provided by the patient, the EMS personnel and a relative. The history is limited by the condition of the patient.  Loss of Consciousness      Past Medical History:  Diagnosis Date  . Anxiety state, unspecified   . Blood transfusion without reported diagnosis   . Bowel obstruction ([place])   . Calculus of kidney   . Cerebrovascular disease, unspecified   . Diaphragmatic hernia without mention of obstruction or gangrene   . Diverticulosis of colon (without mention of hemorrhage)   . Esophageal reflux   . Gout, unspecified   . History of gallstones   . History of seizures   . HTN (hypertension)   . IBS (irritable bowel syndrome)   . Kidney carcinoma ([place])   . Lumbago   . Obstructive sleep apnea (adult) (pediatric)    doesnt wear CPAP  . Osteoarthrosis, unspecified whether generalized or localized, unspecified site    tendonitis  . Other specified congenital anomaly of kidney    . Overweight(278.02)   . Pure hypercholesterolemia   . Seizures ([place])    last seizure November 2012  . Stroke ([place])    2001  . Type II or unspecified type diabetes mellitus without mention of complication, not stated as uncontrolled   . Unspecified venous (peripheral) insufficiency     Patient Active Problem List   Diagnosis Date Noted  . Anemia of chronic disease 06/08/2016  . Mild dementia 05/23/2016  . History of stroke 08/02/2015  . Hyperlipidemia 08/02/2015  . Type 2 diabetes mellitus without complication, with long-term current use of insulin ([place]) 08/02/2015  . Inflamed sebaceous cyst 03/11/2015  . B12 deficiency 09/25/2014  . Mild cognitive impairment 09/24/2014  . Diabetes mellitus type 2 with complications, uncontrolled ([place]) 04/19/2014  . Cerebral infarction due to thrombosis of left carotid artery ([place])   . Stroke ([place])   . Confusion 04/09/2014  . Cerebrovascular disease   . HLD (hyperlipidemia)   . Carotid stenosis   . Tachycardia 02/26/2014  . CVA (cerebral infarction)   . Dysesthesia 06/10/2012  . Abdominal bruit 06/10/2012  . Other specified transient cerebral ischemias 06/10/2012  . Occlusion and stenosis of carotid artery without mention of cerebral infarction 06/10/2012  . Sudden visual loss 06/10/2012  . Memory impairment 04/25/2012  . Dysphagia 01/18/2012  . Renal cell cancer ([place]) 01/18/2012  . Acute  UTI 04/28/2011  . Renal insufficiency 04/28/2011  . TIA (transient ischemic attack) 03/16/2011  . Closed fracture of tibial plateau 03/11/2011  . Vitamin B12 deficiency 07/25/2008  . OVERWEIGHT 06/06/2008  . ESOPHAGEAL STRICTURE 03/14/2008  . CONSTIPATION 03/14/2008  . OBSTRUCTIVE SLEEP APNEA 11/28/2007  . VENOUS INSUFFICIENCY 11/28/2007  . DIVERTICULOSIS OF COLON 11/28/2007  . NEPHROLITHIASIS 11/28/2007  . Horseshoe kidney 11/28/2007  . HYPERCHOLESTEROLEMIA 03/22/2007  . GOUT 03/22/2007  . ANXIETY 03/22/2007  . Essential hypertension  03/22/2007  . GERD 03/22/2007  . HIATAL HERNIA 03/22/2007  . Osteoarthritis 03/22/2007  . BACK PAIN, LUMBAR 03/22/2007    Past Surgical History:  Procedure Laterality Date  . CAROTID ENDARTERECTOMY  09/2000   Dr.Lawson  . CHOLECYSTECTOMY  9/04   Dr. Rebekah Chesterfield  . EXTERNAL FIXATION LEG  03/10/2011   Procedure: EXTERNAL FIXATION LEG;  Surgeon: Rozanna Box;  Location: [facility];  Service: Orthopedics;  Laterality: Right;  . INCISIONAL HERNIA REPAIR  1/96   Dr.Leone  . NASAL SINUS SURGERY  12/96   Dr.Redman  . 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 ORIF TIBIA PLATEAU  03/24/2011   Procedure: OPEN REDUCTION INTERNAL FIXATION (ORIF) TIBIAL PLATEAU;  Surgeon: Rozanna Box;  Location: [facility];  Service: Orthopedics;  Laterality: Right;  . RADIOLOGY WITH ANESTHESIA N/A 04/12/2014   Procedure: ANGIOPLASTY;  Surgeon: Rob Hickman, MD;  Location: [facility];  Service: Radiology;  Laterality: N/A;  . SMALL INTESTINE SURGERY    . veterbral art angioplasty     x2       Home Medications    Prior to Admission medications   Medication Sig Start Date End Date Taking? Authorizing Provider  ALPRAZolam Duanne Moron) 0.5 MG tablet TAKE 0.5-1 TABLET BY MOUTH THREE TIMES ADAY 04/22/17   Noralee Space, MD  aspirin EC 81 MG EC tablet Take 1 tablet (81 mg total) by mouth daily. 02/28/14   Ghimire, Henreitta Leber, MD  atorvastatin (LIPITOR) 80 MG tablet Take 1 tablet (80 mg total) by mouth daily at 6 PM. 03/09/17   Noralee Space, MD  clopidogrel (PLAVIX) 75 MG tablet TAKE 1 TABLET BY MOUTH EVERY DAY 03/31/17   Noralee Space, MD  donepezil (ARICEPT) 10 MG tablet Take 1 tablet (10 mg total) by mouth daily. 05/21/17   Cameron Sprang, MD  glipiZIDE (GLUCOTROL XL) 5 MG 24 hr tablet Take 1 tablet (5 mg total) by mouth daily with breakfast. 11/04/16   Noralee Space, MD  Insulin Pen Needle (B-D ULTRAFINE III SHORT PEN) 31G X 8 MM MISC Use with the lantus solostar pen as directed 10/23/16    Noralee Space, MD  LANTUS SOLOSTAR 100 UNIT/ML Solostar Pen INJECT 10 UNITS INTO THE SKIN AT BEDTIME. Patient taking differently: INJECT 15 UNITS INTO THE SKIN AT BEDTIME. 08/11/16   Noralee Space, MD  losartan (COZAAR) 50 MG tablet TAKE 1 TABLET (50 MG TOTAL) BY MOUTH DAILY. 10/05/16   Noralee Space, MD  metFORMIN (GLUCOPHAGE) 500 MG tablet TAKE 2 TABLETS TWICE A DAY 09/21/16   Noralee Space, MD  ONE [facility] ULTRA TEST test strip USE AS INSTRUCTED 12/28/16   Noralee Space, MD  pantoprazole (PROTONIX) 40 MG tablet TAKE 1 TABLET (40 MG TOTAL) BY MOUTH DAILY. 03/15/17   Noralee Space, MD  saxagliptin HCl (ONGLYZA) 5 MG TABS tablet Take 1 tablet (5 mg total) by mouth daily. 02/02/17   Noralee Space,  MD  sertraline (ZOLOFT) 50 MG tablet Take 1 tablet (50 mg total) by mouth at bedtime. 05/25/17   Noralee Space, MD    Family History Family History  Problem Relation Age of Onset  . Heart attack Father   . Dementia Mother   . Ovarian cancer Paternal Grandmother   . Breast cancer Sister   . Colon cancer Neg Hx   . Esophageal cancer Neg Hx   . Rectal cancer Neg Hx   . Stomach cancer Neg Hx     Social History Social History   Tobacco Use  . Smoking status: Never Smoker  . Smokeless tobacco: Never Used  Substance Use Topics  . Alcohol use: No    Alcohol/week: 0.0 oz  . Drug use: No     Allergies   Dilaudid [hydromorphone hcl] and Fentanyl   Review of Systems Review of Systems  Unable to perform ROS: Dementia  Cardiovascular: Positive for syncope.     Physical Exam Updated Vital Signs BP (!) 193/72 (BP Location: Left Arm)   Pulse 70   Temp 98.3 F (36.8 C) (Oral)   Resp 16   SpO2 97%   Physical Exam  Constitutional: He is oriented to person, place, and time. He appears well-developed and well-nourished. No distress.  Vomit on clothing  HENT:  Head: Normocephalic and atraumatic.  Mouth/Throat: Oropharynx is clear and moist. No oropharyngeal exudate.  Eyes: Conjunctivae  and EOM are normal. Pupils are equal, round, and reactive to light.  Neck: Normal range of motion. Neck supple.  No meningismus.  Cardiovascular: Normal rate, regular rhythm, normal heart sounds and intact distal pulses.  No murmur heard. Pulmonary/Chest: Effort normal and breath sounds normal. No respiratory distress. He exhibits no tenderness.  Abdominal: Soft. There is no tenderness. There is no rebound and no guarding.  Musculoskeletal: Normal range of motion. He exhibits no edema or tenderness.  Neurological: He is alert and oriented to person, place, and time. No cranial nerve deficit. He exhibits normal muscle tone. Coordination normal.  No ataxia on finger to nose bilaterally. No pronator drift. 5/5 strength throughout. CN 2-12 intact.Equal grip strength. Sensation intact.   Patient oriented to person place and time.  Skin: Skin is warm.  Psychiatric: He has a normal mood and affect. His behavior is normal.  Nursing note and vitals reviewed.    ED Treatments / Results  Labs (all labs ordered are listed, but only abnormal results are displayed) Labs Reviewed  BASIC METABOLIC PANEL - Abnormal; Notable for the following components:      Result Value   CO2 21 (*)    Glucose, Bld 218 (*)    BUN 24 (*)    Creatinine, Ser 1.54 (*)    GFR calc non Af Amer 42 (*)    GFR calc Af Amer 48 (*)    All other components within normal limits  CBC - Abnormal; Notable for the following components:   RBC 3.51 (*)    Hemoglobin 11.0 (*)    HCT 33.8 (*)    All other components within normal limits  ACETAMINOPHEN LEVEL - Abnormal; Notable for the following components:   Acetaminophen (Tylenol), Serum <10 (*)    All other components within normal limits  CBG MONITORING, ED - Abnormal; Notable for the following components:   Glucose-Capillary 189 (*)    All other components within normal limits  ETHANOL  SALICYLATE LEVEL  URINALYSIS, ROUTINE W REFLEX MICROSCOPIC  RAPID URINE DRUG SCREEN,  HOSP PERFORMED  I-STAT TROPONIN, ED    EKG  EKG Interpretation  Date/Time:  Sunday June 27 2017 02:03:26 EDT Ventricular Rate:  87 PR Interval:  162 QRS Duration: 74 QT Interval:  370 QTC Calculation: 445 R Axis:   23 Text Interpretation:  Normal sinus rhythm with sinus arrhythmia Normal ECG No significant change was found Confirmed by Ezequiel Essex [phone]) on 06/27/2017 2:12:32 AM       Radiology Dg Chest 2 View  Result Date: 06/27/2017 CLINICAL DATA:  Chest pain. EXAM: CHEST - 2 VIEW COMPARISON:  08/22/2015 FINDINGS: The cardiomediastinal contours are normal. Mild aortic arch atherosclerosis, unchanged from prior exam. Pulmonary vasculature is normal. No consolidation, pleural effusion, or pneumothorax. No acute osseous abnormalities are seen. There is degenerative change in the thoracic spine. IMPRESSION: 1. No acute abnormality. 2.  Aortic Atherosclerosis (ICD10-I70.0). Electronically Signed   By: Jeb Levering [name]   On: 06/27/2017 03:26   Ct Head Wo Contrast  Result Date: 06/27/2017 CLINICAL DATA:  Altered level of consciousness. EXAM: CT HEAD WITHOUT CONTRAST TECHNIQUE: Contiguous axial images were obtained from the base of the skull through the vertex without intravenous contrast. COMPARISON:  Brain MRI 03/20/2016 FINDINGS: Brain: Remote infarcts in the left frontal parietal lobes are unchanged from prior MRI. Unchanged degree of cerebral atrophy and generalized chronic small vessel ischemia. Prior cerebellar infarcts on prior MRI are not well seen by CT. No intracranial hemorrhage, mass effect, or midline shift. No hydrocephalus. The basilar cisterns are patent. No evidence of territorial infarct or acute ischemia. No extra-axial or intracranial fluid collection. Vascular: Atherosclerosis of skullbase vasculature without hyperdense vessel or abnormal calcification. Skull: No fracture or focal lesion. Sinuses/Orbits: No acute finding. Other: None. IMPRESSION: 1.  No acute  intracranial abnormality. 2. Unchanged remote infarcts, atrophy, and chronic small vessel ischemia. Electronically Signed   By: Jeb Levering [name]   On: 06/27/2017 03:30    Procedures Procedures (including critical care time)  Medications Ordered in ED Medications  sodium chloride 0.9 % bolus 1,000 mL (1,000 mLs Intravenous New Bag/Given 06/27/17 0242)     Initial Impression / Assessment and Plan / ED Course  I have reviewed the triage vital signs and the nursing notes.  Pertinent labs & imaging results that were available during my care of the patient were reviewed by me and considered in my medical decision making (see chart for details).    Loss consciousness episode. Possible syncope.  Patient alert and oriented at this time. No complaints. Neuro intact.  Wife states he was slumped over in bathroom and she performed chest compressions and patient quickly regained alertness.  EKG with sinus rhythm. No brugada or prolonged QT. Orthostatics negative. IVF given, labs with mild creatinine elevation.  No reported seizures activity or post ictal period. No dizziness. Concern for cardiogenic syncope. CT head stable.  Observation admission d/w Dr. Myna Hidalgo. Final Clinical Impressions(s) / ED Diagnoses   Final diagnoses:  Loss of consciousness [facility])    ED Discharge Orders    None       Ezequiel Essex, MD 06/27/17 [phone]

## 2017-06-27 NOTE — Progress Notes (Signed)
Patient seen and evaluated earlier this a.m. By my associate. Please refer to H&P regarding assessment and plan.  In short patient is a 77 year old male with prior history of CVA, dementia, CKD III, Type 2 DM who presented to the ED after being found unresponsive by wife.  Gen: pt in nad, alert and awake Cv: no cyanosis Pulm: no increased wob, no wheezes  Will reassess next am.  Velvet Bathe MD

## 2017-06-27 NOTE — ED Notes (Signed)
Attempted report 

## 2017-06-28 ENCOUNTER — Observation Stay (HOSPITAL_BASED_OUTPATIENT_CLINIC_OR_DEPARTMENT_OTHER): Payer: Medicare Other

## 2017-06-28 ENCOUNTER — Observation Stay (HOSPITAL_COMMUNITY): Payer: Medicare Other

## 2017-06-28 DIAGNOSIS — I340 Nonrheumatic mitral (valve) insufficiency: Secondary | ICD-10-CM | POA: Diagnosis not present

## 2017-06-28 DIAGNOSIS — Z8673 Personal history of transient ischemic attack (TIA), and cerebral infarction without residual deficits: Secondary | ICD-10-CM | POA: Diagnosis not present

## 2017-06-28 DIAGNOSIS — R55 Syncope and collapse: Secondary | ICD-10-CM | POA: Diagnosis not present

## 2017-06-28 DIAGNOSIS — I361 Nonrheumatic tricuspid (valve) insufficiency: Secondary | ICD-10-CM | POA: Diagnosis not present

## 2017-06-28 DIAGNOSIS — I10 Essential (primary) hypertension: Secondary | ICD-10-CM

## 2017-06-28 LAB — URINALYSIS, ROUTINE W REFLEX MICROSCOPIC
BILIRUBIN URINE: NEGATIVE
Bacteria, UA: NONE SEEN
GLUCOSE, UA: NEGATIVE mg/dL
HGB URINE DIPSTICK: NEGATIVE
KETONES UR: NEGATIVE mg/dL
Leukocytes, UA: NEGATIVE
NITRITE: NEGATIVE
PROTEIN: 100 mg/dL — AB
Specific Gravity, Urine: 1.011 (ref 1.005–1.030)
Squamous Epithelial / LPF: NONE SEEN
pH: 7 (ref 5.0–8.0)

## 2017-06-28 LAB — RAPID URINE DRUG SCREEN, HOSP PERFORMED
Amphetamines: NOT DETECTED
Barbiturates: NOT DETECTED
Benzodiazepines: NOT DETECTED
Cocaine: NOT DETECTED
Opiates: NOT DETECTED
Tetrahydrocannabinol: NOT DETECTED

## 2017-06-28 LAB — ECHOCARDIOGRAM COMPLETE
Height: 64 in
Weight: 2680 oz

## 2017-06-28 LAB — GLUCOSE, CAPILLARY
GLUCOSE-CAPILLARY: 147 mg/dL — AB (ref 65–99)
Glucose-Capillary: 104 mg/dL — ABNORMAL HIGH (ref 65–99)
Glucose-Capillary: 123 mg/dL — ABNORMAL HIGH (ref 65–99)
Glucose-Capillary: 135 mg/dL — ABNORMAL HIGH (ref 65–99)

## 2017-06-28 NOTE — Progress Notes (Signed)
PROGRESS NOTE    Peter Nicholson  GMW:[id number] DOB: [birth year] DOA: 06/27/2017 PCP: Noralee Space, MD    Brief Narrative:  77 y.o. male with medical history significant for history of CVA, dementia, chronic kidney disease stage III, and type 2 diabetes mellitus, now presenting to the emergency department after being found unresponsive by his wife.   Assessment & Plan:   Principal Problem:   Syncope - Etiology uncertain patient undergoing investigation - Plan is to obtain EEG, echocardiogram, and orthostatics (placed order and discussed in meeting today) - f/u with telemetry  Active Problems:   OBSTRUCTIVE SLEEP APNEA -  cpap order placed. Pt refused    Essential hypertension - pt on cozaar.    History of CVA (cerebrovascular accident) -  Pt on lipitor, plavix, and statin    Diabetes mellitus type 2 with complications, uncontrolled ([place]) - Pt on Lantus and SSI - Diabetic diet.     Dementia - stable on aricept    CKD (chronic kidney disease), stage III (HCC)   DVT prophylaxis: Heparin Code Status: Full Family Communication: none at bedside Disposition Plan: pending investigational work up.   Consultants:   none   Procedures: none   Antimicrobials: none   Subjective: Pt has no new complaints currently  Objective: Vitals:   06/27/17 2000 06/27/17 2058 06/28/17 0458 06/28/17 0822  BP:  (!) 172/58 (!) 171/56 (!) 149/56  Pulse:  60 (!) 58   Resp:   20   Temp:  98.6 F (37 C) 98.1 F (36.7 C)   TempSrc:  Oral Oral   SpO2: 98% 99%    Weight:   76 kg (167 lb 8 oz)   Height:        Intake/Output Summary (Last 24 hours) at 06/28/2017 1245 Last data filed at 06/28/2017 0827 Gross per 24 hour  Intake 360 ml  Output 225 ml  Net 135 ml   Filed Weights   06/27/17 1442 06/28/17 0458  Weight: 77 kg (169 lb 12.8 oz) 76 kg (167 lb 8 oz)    Examination:  General exam: Appears calm and comfortable  Respiratory system: Clear to auscultation.  Respiratory effort normal. Equal chest rise.  Cardiovascular system: S1 & S2 heard, RRR. No JVD, murmurs, rubs, gallops or clicks.  Gastrointestinal system: Abdomen is nondistended, soft and nontender. No organomegaly or masses felt. Normal bowel sounds heard. Central nervous system: Alert and oriented. No focal neurological deficits. Extremities: Symmetric 5 x 5 power. Skin: No rashes, lesions or ulcers, on limited exam. Psychiatry:  Mood & affect appropriate.     Data Reviewed: I have personally reviewed following labs and imaging studies  CBC: Recent Labs  Lab 06/27/17 0156  WBC 9.9  HGB 11.0*  HCT 33.8*  MCV 96.3  PLT 440   Basic Metabolic Panel: Recent Labs  Lab 06/27/17 0156  NA 138  K 4.2  CL 108  CO2 21*  GLUCOSE 218*  BUN 24*  CREATININE 1.54*  CALCIUM 9.4   GFR: Estimated Creatinine Clearance: 37.4 mL/min (A) (by C-G formula based on SCr of 1.54 mg/dL (H)). Liver Function Tests: No results for input(s): AST, ALT, ALKPHOS, BILITOT, PROT, ALBUMIN in the last 168 hours. No results for input(s): LIPASE, AMYLASE in the last 168 hours. No results for input(s): AMMONIA in the last 168 hours. Coagulation Profile: No results for input(s): INR, PROTIME in the last 168 hours. Cardiac Enzymes: No results for input(s): CKTOTAL, CKMB, CKMBINDEX, TROPONINI in the last 168 hours. BNP (  last 3 results) No results for input(s): PROBNP in the last 8760 hours. HbA1C: No results for input(s): HGBA1C in the last 72 hours. CBG: Recent Labs  Lab 06/27/17 1212 06/27/17 1628 06/27/17 2057 06/28/17 0729 06/28/17 1132  GLUCAP 122* 120* 134* 104* 123*   Lipid Profile: No results for input(s): CHOL, HDL, LDLCALC, TRIG, CHOLHDL, LDLDIRECT in the last 72 hours. Thyroid Function Tests: No results for input(s): TSH, T4TOTAL, FREET4, T3FREE, THYROIDAB in the last 72 hours. Anemia Panel: No results for input(s): VITAMINB12, FOLATE, FERRITIN, TIBC, IRON, RETICCTPCT in the last 72  hours. Sepsis Labs: No results for input(s): PROCALCITON, LATICACIDVEN in the last 168 hours.  No results found for this or any previous visit (from the past 240 hour(s)).       Radiology Studies: Dg Chest 2 View  Result Date: 06/27/2017 CLINICAL DATA:  Chest pain. EXAM: CHEST - 2 VIEW COMPARISON:  08/22/2015 FINDINGS: The cardiomediastinal contours are normal. Mild aortic arch atherosclerosis, unchanged from prior exam. Pulmonary vasculature is normal. No consolidation, pleural effusion, or pneumothorax. No acute osseous abnormalities are seen. There is degenerative change in the thoracic spine. IMPRESSION: 1. No acute abnormality. 2.  Aortic Atherosclerosis (ICD10-I70.0). Electronically Signed   By: Jeb Levering [name]   On: 06/27/2017 03:26   Ct Head Wo Contrast  Result Date: 06/27/2017 CLINICAL DATA:  Altered level of consciousness. EXAM: CT HEAD WITHOUT CONTRAST TECHNIQUE: Contiguous axial images were obtained from the base of the skull through the vertex without intravenous contrast. COMPARISON:  Brain MRI 03/20/2016 FINDINGS: Brain: Remote infarcts in the left frontal parietal lobes are unchanged from prior MRI. Unchanged degree of cerebral atrophy and generalized chronic small vessel ischemia. Prior cerebellar infarcts on prior MRI are not well seen by CT. No intracranial hemorrhage, mass effect, or midline shift. No hydrocephalus. The basilar cisterns are patent. No evidence of territorial infarct or acute ischemia. No extra-axial or intracranial fluid collection. Vascular: Atherosclerosis of skullbase vasculature without hyperdense vessel or abnormal calcification. Skull: No fracture or focal lesion. Sinuses/Orbits: No acute finding. Other: None. IMPRESSION: 1.  No acute intracranial abnormality. 2. Unchanged remote infarcts, atrophy, and chronic small vessel ischemia. Electronically Signed   By: Jeb Levering [name]   On: 06/27/2017 03:30        Scheduled Meds: . aspirin EC  81  mg Oral Daily  . atorvastatin  80 mg Oral q1800  . clopidogrel  75 mg Oral Daily  . donepezil  10 mg Oral Daily  . heparin  5,000 Units Subcutaneous Q8H  . insulin aspart  0-5 Units Subcutaneous QHS  . insulin aspart  0-9 Units Subcutaneous TID WC  . insulin glargine  7 Units Subcutaneous QHS  . losartan  50 mg Oral Daily  . pantoprazole  40 mg Oral Daily  . sertraline  50 mg Oral QHS  . sodium chloride flush  3 mL Intravenous Q12H  . sodium chloride flush  3 mL Intravenous Q12H   Continuous Infusions: . sodium chloride       LOS: 0 days    Time spent: > [place], MD [facility] Pager [phone]  If 7PM-7AM, please contact night-coverage [email or web address] Password [facility] 06/28/2017, 12:45 PM

## 2017-06-28 NOTE — Care Management Obs Status (Signed)
[place] NOTIFICATION   Patient Details  Name: CATALDO COSGRIFF MRN: [id number] Date of Birth: 09/18/1940   Medicare Observation Status Notification Given:  Yes    [name], RN 06/28/2017, 4:38 PM

## 2017-06-28 NOTE — Progress Notes (Signed)
EEG completed; results pending.    

## 2017-06-28 NOTE — Progress Notes (Signed)
New CPAP order, patient refused, RN aware.

## 2017-06-28 NOTE — Progress Notes (Signed)
  Echocardiogram 2D Echocardiogram has been performed.  Peter Nicholson M 06/28/2017, 2:26 PM

## 2017-06-28 NOTE — Plan of Care (Signed)
  Safety: Ability to remain free from injury will improve 06/28/2017 2351 - Progressing by Jacqlyn Larsen, RN Note Pt's orthostatic's remain negative. Pt in bed with alarm on. Pt has not attempted to exit bed without help from staff. Will continue to reorient patient and monitor for any changes.

## 2017-06-28 NOTE — Procedures (Signed)
HPI:  77 y/o with syncope/MS change  TECHNICAL SUMMARY:  A multichannel referential and bipolar montage EEG using the standard international 10-20 system was performed on the patient described as awake, drowsy and asleep.  The dominant background activity consists of 8-9 hertz activity seen most prominantly over the posterior head region.  Rarely, 5-7 Hz activity is noted in all head regions.  The backgound activity is reactive to eye opening and closing procedures.  Low voltage fast (beta) activity is distributed symmetrically and maximally over the anterior head regions.  ACTIVATION:  Stepwise photic stimulation at 4-20 flashes per second was performed and did not elicit any abnormal waveforms.  Hyperventilation was not performed.  EPILEPTIFORM ACTIVITY:  There were no spikes, sharp waves or paroxysmal activity.  SLEEP: Stage I and II sleep are noted.   IMPRESSION:  This is an slightly abnormal EEG demonstrating a very mild diffuse slowing of electrocerebral activity.  This can be seen in a wide variety of encephalopathic state including those of a toxic, metabolic, or degenerative nature.  There were no focal, hemispheric, or lateralizing features.  No epileptiform activity was recorded.

## 2017-06-29 DIAGNOSIS — R55 Syncope and collapse: Secondary | ICD-10-CM | POA: Diagnosis not present

## 2017-06-29 DIAGNOSIS — I10 Essential (primary) hypertension: Secondary | ICD-10-CM | POA: Diagnosis not present

## 2017-06-29 LAB — GLUCOSE, CAPILLARY
GLUCOSE-CAPILLARY: 117 mg/dL — AB (ref 65–99)
Glucose-Capillary: 104 mg/dL — ABNORMAL HIGH (ref 65–99)
Glucose-Capillary: 118 mg/dL — ABNORMAL HIGH (ref 65–99)

## 2017-06-29 NOTE — Care Management Note (Signed)
Case Management Note  Patient Details  Name: Peter Nicholson MRN: [id number] Date of Birth: 1940-12-28  Subjective/Objective:  Pt presented for Syncope- orthostatic hypotension. Pt is from home with family support- 24/7 for supervision.                  Action/Plan: PT Recommendations for HH PT- Family and patient agreeable to [facility]. SOC to begin within 24-48 hours post d/c. No further needs from this CM.    Expected Discharge Date:  06/29/17               Expected Discharge Plan:  Home/Self Care  In-House Referral:  NA  Discharge planning Services  CM Consult  Post Acute Care Choice:  Home Health Choice offered to:  Patient  DME Arranged:  N/A DME Agency:  NA  HH Arranged:  PT HH Agency:  [place]  Status of Service:  Completed, signed off  If discussed at [place] of Stay Meetings, dates discussed:    Additional Comments:  [name], RN 06/29/2017, 3:47 PM

## 2017-06-29 NOTE — Progress Notes (Signed)
Discharge instruction was given to pt and family. Belongings were sent home with family.  Idolina Primer, RN

## 2017-06-29 NOTE — Evaluation (Signed)
Physical Therapy Evaluation Patient Details Name: Peter Nicholson MRN: [id number] DOB: 05/24/1940 Today's Date: 06/29/2017   History of Present Illness  Pt is a 77 y/o male admitted after syncopal episode. CT of head was negative and chest imaging was negative. Pt with slightly abnormal EEG. PMH includes DM, CVA, dementia, CKD 3, HTN, OSA, seizures, and ORIF of R tibial plateau.   Clinical Impression  Pt admitted secondary to problem above with deficits below. Pt tolerated gait well, however, required safety cues during mobility secondary to cognitive deficits. Required min guard A with RW this session. Feel pt would benefit from 24/7 supervision at home given current cognitive deficits to increase safety. Pt reports wife can provide 24/7, but will need to ensure. Will continue to follow acutely to maximize functional mobility independence and safety.     Follow Up Recommendations Home health PT;Supervision/Assistance - 24 hour( HHPT Safety eval )    Equipment Recommendations  None recommended by PT    Recommendations for Other Services       Precautions / Restrictions Precautions Precautions: Fall Restrictions Weight Bearing Restrictions: No      Mobility  Bed Mobility Overal bed mobility: Needs Assistance Bed Mobility: Supine to Sit;Sit to Supine     Supine to sit: Supervision Sit to supine: Supervision   General bed mobility comments: Supervision for safety. Cues to wait for PT as pt wanting to stand after sitting on EOB.   Transfers Overall transfer level: Needs assistance Equipment used: Rolling walker (2 wheeled) Transfers: Sit to/from Stand Sit to Stand: Min guard         General transfer comment: Min guard for safety. No physical assist required. Verbal cues for safe hand placement.   Ambulation/Gait Ambulation/Gait assistance: Min guard Ambulation Distance (Feet): 200 Feet Assistive device: Rolling walker (2 wheeled) Gait Pattern/deviations: Step-through  pattern;Decreased stride length Gait velocity: Decreased  Gait velocity interpretation: Below normal speed for age/gender General Gait Details: Slow, overall steady gait with use of RW. No overt LOB noted. Verbal cues for proximity to device.   Stairs Stairs: Yes       General stair comments: Pt refusing to practice this session, however, had pt march in place to ensure pt would be able to clear steps. Educated about safe management of steps at home.   Wheelchair Mobility    Modified Rankin (Stroke Patients Only)       Balance Overall balance assessment: Needs assistance Sitting-balance support: No upper extremity supported;Feet supported Sitting balance-Leahy Scale: Good     Standing balance support: Bilateral upper extremity supported;No upper extremity supported;During functional activity Standing balance-Leahy Scale: Fair Standing balance comment: Able to maintain static standing without UE support.                              Pertinent Vitals/Pain Pain Assessment: No/denies pain    Home Living Family/patient expects to be discharged to:: Private residence Living Arrangements: Spouse/significant other Available Help at Discharge: Family;Available 24 hours/day Type of Home: House Home Access: Stairs to enter Entrance Stairs-Rails: Psychiatric nurse of Steps: 3 Home Layout: One level Home Equipment: Walker - 2 wheels Additional Comments: Will need to ensure accuracy of information with family.     Prior Function Level of Independence: Independent         Comments: Pt reporting initially he used a RW for ambulation, however, at end of session reports he has not used recently.  Hand Dominance        Extremity/Trunk Assessment   Upper Extremity Assessment Upper Extremity Assessment: Overall WFL for tasks assessed    Lower Extremity Assessment Lower Extremity Assessment: Generalized weakness    Cervical / Trunk  Assessment Cervical / Trunk Assessment: Normal  Communication   Communication: No difficulties  Cognition Arousal/Alertness: Awake/alert Behavior During Therapy: WFL for tasks assessed/performed Overall Cognitive Status: History of cognitive impairments - at baseline                                 General Comments: Dementia at baseline. Pt easily distractible and responding with answers that did not relate to questions asked. Required       General Comments      Exercises     Assessment/Plan    PT Assessment Patient needs continued PT services  PT Problem List Decreased strength;Decreased balance;Decreased mobility;Decreased knowledge of use of DME;Decreased knowledge of precautions;Decreased safety awareness;Decreased cognition       PT Treatment Interventions DME instruction;Gait training;Therapeutic activities;Functional mobility training;Stair training;Therapeutic exercise;Balance training;Neuromuscular re-education;Patient/family education;Cognitive remediation    PT Goals (Current goals can be found in the Care Plan section)  Acute Rehab PT Goals Patient Stated Goal: to go home  PT Goal Formulation: With patient Time For Goal Achievement: 07/13/17 Potential to Achieve Goals: Good    Frequency Min 3X/week   Barriers to discharge        Co-evaluation               AM-PAC PT "6 Clicks" Daily Activity  Outcome Measure Difficulty turning over in bed (including adjusting bedclothes, sheets and blankets)?: None Difficulty moving from lying on back to sitting on the side of the bed? : A Little Difficulty sitting down on and standing up from a chair with arms (e.g., wheelchair, bedside commode, etc,.)?: Unable Help needed moving to and from a bed to chair (including a wheelchair)?: A Little Help needed walking in hospital room?: A Little Help needed climbing 3-5 steps with a railing? : A Little 6 Click Score: 17    End of Session Equipment  Utilized During Treatment: Gait belt Activity Tolerance: Patient tolerated treatment well Patient left: in bed;with call bell/phone within reach;with bed alarm set Nurse Communication: Mobility status PT Visit Diagnosis: Other abnormalities of gait and mobility (R26.89);Muscle weakness (generalized) (M62.81)    Time: 1042-1100 PT Time Calculation (min) (ACUTE ONLY): 18 min   Charges:   PT Evaluation $PT Eval Moderate Complexity: 1 Mod     PT G Codes:        Leighton Ruff, PT, DPT  Acute Rehabilitation Services  Pager: [phone]   Rudean Hitt 06/29/2017, 11:15 AM

## 2017-06-29 NOTE — Discharge Summary (Signed)
Physician Discharge Summary  Peter Nicholson JYN:[id number] DOB: 1941-03-30 DOA: 06/27/2017  PCP: Noralee Space, MD  Admit date: 06/27/2017 Discharge date: 06/29/2017  Time spent: > 35 minutes  Recommendations for Outpatient Follow-up:  1. Monitor blood pressures. I was not aggressive with BP control given that patient had orthostatic changes in his blood pressures and I suspect he may have had orthostatic hypotension which led to his admission.    Discharge Diagnoses:  Principal Problem:   Syncope Active Problems:   OBSTRUCTIVE SLEEP APNEA   Essential hypertension   History of CVA (cerebrovascular accident)   Diabetes mellitus type 2 with complications, uncontrolled ([place])   Dementia   Anemia of chronic disease   CKD (chronic kidney disease), stage III [facility])   Discharge Condition: stable  Diet recommendation: carb modified diet  Filed Weights   06/27/17 1442 06/28/17 0458 06/29/17 0437  Weight: 77 kg (169 lb 12.8 oz) 76 kg (167 lb 8 oz) 73.7 kg (162 lb 8 oz)    History of present illness:  77 y.o. male with medical history significant for history of CVA, dementia, chronic kidney disease stage III, and type 2 diabetes mellitus, now presenting to the emergency department after being found unresponsive by his wife    Hospital Course:  Syncope - at this point suspect secondary to orthostatic hypotension. Has resolved after fluid rehydration - work up otherwise negative with EEG and echocardiogram - recommended patient remain well hydrated   Otherwise continue prior to admission medication regimen.  Procedures:  none  Consultations:  None  Discharge Exam: Vitals:   06/28/17 2148 06/29/17 0437  BP: (!) 167/58 (!) 171/84  Pulse: 61 (!) 56  Resp:    Temp: 98.7 F (37.1 C) 98.6 F (37 C)  SpO2: 98%     General: Pt in nad, alert and awake Cardiovascular: rrr, no rubs Respiratory: no increased wob, no wheezes  Discharge Instructions   Discharge Instructions     Diet - low sodium heart healthy   Complete by:  As directed    Discharge instructions   Complete by:  As directed    Please be sure to follow up with your primary care physician for evaluation of your blood pressures.   Increase activity slowly   Complete by:  As directed      Allergies as of 06/29/2017      Reactions   Dilaudid [hydromorphone Hcl] Other (See Comments)   hallucinations   Fentanyl Other (See Comments)   hallucinations      Medication List    TAKE these medications   ALPRAZolam 0.5 MG tablet Commonly known as:  XANAX TAKE 0.5-1 TABLET BY MOUTH THREE TIMES ADAY What changed:  See the new instructions.   aspirin 81 MG EC tablet Take 1 tablet (81 mg total) by mouth daily.   atorvastatin 80 MG tablet Commonly known as:  LIPITOR Take 1 tablet (80 mg total) by mouth daily at 6 PM.   cholecalciferol 1000 units tablet Commonly known as:  VITAMIN D Take 2,000 Units by mouth daily.   clopidogrel 75 MG tablet Commonly known as:  PLAVIX TAKE 1 TABLET BY MOUTH EVERY DAY   donepezil 10 MG tablet Commonly known as:  ARICEPT Take 1 tablet (10 mg total) by mouth daily.   glipiZIDE 5 MG 24 hr tablet Commonly known as:  GLUCOTROL XL Take 1 tablet (5 mg total) by mouth daily with breakfast.   Insulin Pen Needle 31G X 8 MM Misc Commonly known  as:  B-D ULTRAFINE III SHORT PEN Use with the lantus solostar pen as directed   LANTUS SOLOSTAR 100 UNIT/ML Solostar Pen Generic drug:  Insulin Glargine INJECT 10 UNITS INTO THE SKIN AT BEDTIME. What changed:  See the new instructions.   metFORMIN 500 MG tablet Commonly known as:  GLUCOPHAGE TAKE 2 TABLETS TWICE A DAY   ONE TOUCH ULTRA TEST test strip Generic drug:  glucose blood USE AS INSTRUCTED   pantoprazole 40 MG tablet Commonly known as:  PROTONIX TAKE 1 TABLET (40 MG TOTAL) BY MOUTH DAILY.   saxagliptin HCl 5 MG Tabs tablet Commonly known as:  ONGLYZA Take 1 tablet (5 mg total) by mouth daily.    sertraline 50 MG tablet Commonly known as:  ZOLOFT Take 1 tablet (50 mg total) by mouth at bedtime.      Allergies  Allergen Reactions  . Dilaudid [Hydromorphone Hcl] Other (See Comments)    hallucinations  . Fentanyl Other (See Comments)    hallucinations      The results of significant diagnostics from this hospitalization (including imaging, microbiology, ancillary and laboratory) are listed below for reference.    Significant Diagnostic Studies: Dg Chest 2 View  Result Date: 06/27/2017 CLINICAL DATA:  Chest pain. EXAM: CHEST - 2 VIEW COMPARISON:  08/22/2015 FINDINGS: The cardiomediastinal contours are normal. Mild aortic arch atherosclerosis, unchanged from prior exam. Pulmonary vasculature is normal. No consolidation, pleural effusion, or pneumothorax. No acute osseous abnormalities are seen. There is degenerative change in the thoracic spine. IMPRESSION: 1. No acute abnormality. 2.  Aortic Atherosclerosis (ICD10-I70.0). Electronically Signed   By: Jeb Levering [name]   On: 06/27/2017 03:26   Ct Head Wo Contrast  Result Date: 06/27/2017 CLINICAL DATA:  Altered level of consciousness. EXAM: CT HEAD WITHOUT CONTRAST TECHNIQUE: Contiguous axial images were obtained from the base of the skull through the vertex without intravenous contrast. COMPARISON:  Brain MRI 03/20/2016 FINDINGS: Brain: Remote infarcts in the left frontal parietal lobes are unchanged from prior MRI. Unchanged degree of cerebral atrophy and generalized chronic small vessel ischemia. Prior cerebellar infarcts on prior MRI are not well seen by CT. No intracranial hemorrhage, mass effect, or midline shift. No hydrocephalus. The basilar cisterns are patent. No evidence of territorial infarct or acute ischemia. No extra-axial or intracranial fluid collection. Vascular: Atherosclerosis of skullbase vasculature without hyperdense vessel or abnormal calcification. Skull: No fracture or focal lesion. Sinuses/Orbits: No acute  finding. Other: None. IMPRESSION: 1.  No acute intracranial abnormality. 2. Unchanged remote infarcts, atrophy, and chronic small vessel ischemia. Electronically Signed   By: Jeb Levering [name]   On: 06/27/2017 03:30    Microbiology: No results found for this or any previous visit (from the past 240 hour(s)).   Labs: Basic Metabolic Panel: Recent Labs  Lab 06/27/17 0156  NA 138  K 4.2  CL 108  CO2 21*  GLUCOSE 218*  BUN 24*  CREATININE 1.54*  CALCIUM 9.4   Liver Function Tests: No results for input(s): AST, ALT, ALKPHOS, BILITOT, PROT, ALBUMIN in the last 168 hours. No results for input(s): LIPASE, AMYLASE in the last 168 hours. No results for input(s): AMMONIA in the last 168 hours. CBC: Recent Labs  Lab 06/27/17 0156  WBC 9.9  HGB 11.0*  HCT 33.8*  MCV 96.3  PLT 217   Cardiac Enzymes: No results for input(s): CKTOTAL, CKMB, CKMBINDEX, TROPONINI in the last 168 hours. BNP: BNP (last 3 results) No results for input(s): BNP in the last 8760 hours.  ProBNP (last 3 results) No results for input(s): PROBNP in the last 8760 hours.  CBG: Recent Labs  Lab 06/28/17 1632 06/28/17 2146 06/29/17 0644 06/29/17 0735 06/29/17 1141  GLUCAP 147* 135* 117* 104* 118*    Signed:  Velvet Bathe MD.  [facility] 06/29/2017, 2:39 PM

## 2017-07-08 ENCOUNTER — Telehealth (HOSPITAL_COMMUNITY): Payer: Self-pay

## 2017-07-08 NOTE — Telephone Encounter (Signed)
Called to schedule f/u mri, no answer, left vm. AW 

## 2017-08-24 ENCOUNTER — Other Ambulatory Visit: Payer: Self-pay | Admitting: Pulmonary Disease

## 2017-08-25 NOTE — Telephone Encounter (Signed)
Will need a appt following this refill.

## 2017-09-22 ENCOUNTER — Telehealth: Payer: Self-pay | Admitting: Pulmonary Disease

## 2017-09-22 NOTE — Telephone Encounter (Signed)
Spoke with the pt's spouse  She states that she believes that the pt had a fall this morning  She thinks this b/c he has a scrape on his arm and also a black eye  She says he is not c/o any HA, vision change or any other co's She states that when she found him he was in the bed already but he had used the bathroom on the floor  Per SN- the only thing to do is take him to ED for eval  I made her aware of this  She verbalized understanding

## 2017-10-02 ENCOUNTER — Encounter: Payer: Self-pay | Admitting: Pulmonary Disease

## 2017-10-02 DIAGNOSIS — Z7982 Long term (current) use of aspirin: Secondary | ICD-10-CM | POA: Diagnosis not present

## 2017-10-02 DIAGNOSIS — S61210A Laceration without foreign body of right index finger without damage to nail, initial encounter: Secondary | ICD-10-CM | POA: Diagnosis not present

## 2017-10-02 DIAGNOSIS — E119 Type 2 diabetes mellitus without complications: Secondary | ICD-10-CM | POA: Diagnosis not present

## 2017-10-02 DIAGNOSIS — S61301A Unspecified open wound of left index finger with damage to nail, initial encounter: Secondary | ICD-10-CM | POA: Diagnosis not present

## 2017-10-02 DIAGNOSIS — Z7984 Long term (current) use of oral hypoglycemic drugs: Secondary | ICD-10-CM | POA: Diagnosis not present

## 2017-10-02 DIAGNOSIS — Z7902 Long term (current) use of antithrombotics/antiplatelets: Secondary | ICD-10-CM | POA: Diagnosis not present

## 2017-10-02 DIAGNOSIS — Z8673 Personal history of transient ischemic attack (TIA), and cerebral infarction without residual deficits: Secondary | ICD-10-CM | POA: Diagnosis not present

## 2017-10-02 DIAGNOSIS — Z794 Long term (current) use of insulin: Secondary | ICD-10-CM | POA: Diagnosis not present

## 2017-10-05 DIAGNOSIS — Z4800 Encounter for change or removal of nonsurgical wound dressing: Secondary | ICD-10-CM | POA: Diagnosis not present

## 2017-10-05 DIAGNOSIS — S61200A Unspecified open wound of right index finger without damage to nail, initial encounter: Secondary | ICD-10-CM | POA: Diagnosis not present

## 2017-10-07 ENCOUNTER — Ambulatory Visit (INDEPENDENT_AMBULATORY_CARE_PROVIDER_SITE_OTHER): Payer: Medicare Other | Admitting: Pulmonary Disease

## 2017-10-07 ENCOUNTER — Encounter: Payer: Self-pay | Admitting: Pulmonary Disease

## 2017-10-07 VITALS — BP 126/58 | HR 80 | Temp 98.3°F | Ht 66.0 in | Wt 162.8 lb

## 2017-10-07 DIAGNOSIS — I679 Cerebrovascular disease, unspecified: Secondary | ICD-10-CM

## 2017-10-07 DIAGNOSIS — I63032 Cerebral infarction due to thrombosis of left carotid artery: Secondary | ICD-10-CM | POA: Diagnosis not present

## 2017-10-07 DIAGNOSIS — N183 Chronic kidney disease, stage 3 unspecified: Secondary | ICD-10-CM

## 2017-10-07 DIAGNOSIS — E7800 Pure hypercholesterolemia, unspecified: Secondary | ICD-10-CM

## 2017-10-07 DIAGNOSIS — D638 Anemia in other chronic diseases classified elsewhere: Secondary | ICD-10-CM

## 2017-10-07 DIAGNOSIS — I10 Essential (primary) hypertension: Secondary | ICD-10-CM

## 2017-10-07 DIAGNOSIS — E119 Type 2 diabetes mellitus without complications: Secondary | ICD-10-CM

## 2017-10-07 DIAGNOSIS — F0150 Vascular dementia without behavioral disturbance: Secondary | ICD-10-CM

## 2017-10-07 DIAGNOSIS — I6523 Occlusion and stenosis of bilateral carotid arteries: Secondary | ICD-10-CM | POA: Diagnosis not present

## 2017-10-07 DIAGNOSIS — C641 Malignant neoplasm of right kidney, except renal pelvis: Secondary | ICD-10-CM

## 2017-10-07 DIAGNOSIS — Z0000 Encounter for general adult medical examination without abnormal findings: Secondary | ICD-10-CM

## 2017-10-07 DIAGNOSIS — Q631 Lobulated, fused and horseshoe kidney: Secondary | ICD-10-CM

## 2017-10-07 DIAGNOSIS — Z794 Long term (current) use of insulin: Secondary | ICD-10-CM

## 2017-10-07 NOTE — Patient Instructions (Signed)
Today we updated your med list in our EPIC system...    Continue your current medications the same...  Please return to our lab one morning soon for your FASTING blood work...    We will contact you w/ the results when available...   Please be careful-- as we discussed!  Call for any questions or if we can be of service in any way.Marland KitchenMarland Kitchen

## 2017-10-07 NOTE — Progress Notes (Signed)
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 lobes & old infarcts in cerebellum & thalamus; DrTDeveshwar attempted intervention to  left carotid system but this was reported as unsuccessful (unsucceeful attempt at revascularization of occluded LICA cavernous segment w/ multiple collaterals noted);  Last saw DrTDeveshwar 10/2014- note reviewed & 7/16 MRI/MRA & catheter angiogram reviewed=> tight focal stenosis of the right internal carotid artery distal cavernous segment, and the left vertebrobasilar junction suggestive of interval worsening. However no parenchymal changes suggestive of intervening or acute ischemic changes were noted on the DWI images- therefore they opted for continued medical management. ~  5/17: he remains on ASA81 + Plavix75 => clinically stable w/ his severe cerebrovasc dis... ~  10/2015:  He was HOSP at [facility] w/ lacunar infarct in left caudate nucleus => spont improvement, no change in meds, referred back to Neurology- DrAquino...  VENOUS INSUFFICIENCY (ICD-459.81) - he knows to avoid sodium, elevate legs, wear support hose...  HYPERCHOLESTEROLEMIA (ICD-272.0) - on CRESTOR 6m/d since Hosp 12/12... ~  FLyons6/08 showed TChol 153, TG 120, HDL 36, LDL 93 ~  FLP 8/09 showed TChol 131, TG 84, HDL 38, LDL 76 ~  2010:  pt was  not fasting for blood work... rec to continue Crestor10 + diet efforts. ~  FLP 10/11 showed TChol 210, TG 161, HDL 40, LDL 149... rec to start Crestor 261md, he will decide. ~  FLP 6/12 on Cres20 showed TChol 106, TG 95, HDL 41, LDL 46... Continue same. ~  [place] showed TChol 100, TG 101, HDL 37, LDL 43... They decr to Cres10. ~  FLP 3/13 on Cres10 showed TChol 118, TG 67, HDL 44, LDL 61... Continue same, later switched to Simva40 for $$ reasons. ~  FLP 10/14 on Simva40 showed TChol 136, TG 54, HDL 49, LDL 77... Continue same. ~  FLP 12/15 on Simva40 w/ ?compliance showed TChol 175, TG 105, HDL 41, LDL 113 => changed to AtBlue Eye/ supervision of all meds by wife. ~  FLP 5/16 on Atorva80 showed TChol 113, TG 51, HDL 48, LDL 54 => much improved, continue the same.  DIABETES MELLITUS (ICD-250.00) >>  ~  on METFORMIN 50038m2Bid, GLYBURIDE 5mg84m, ACTOS 30mg64m& ONGLYZA 5mg/d84m ~  labs 6/08 showed BS= 155, HgA1c= 8.1... (glMarland Kitchenburide incr to 5Bid). ~  labs 11/08 showed BS= 352, HgA1c= 8.1.... (Actos added). ~  labs 8/09 showed BS= 128, HgA1c= 7.1... recMarland KitchenMarland Kitchensame meds, better diet, get wt down! ~  labs 2/10 showed BS= 249, A1c= 7.3.... Metform incr to 2Bid. ~  labs 12/10 (nonfasting- on all 3 meds) showed BS= 87, A1c= 6.9 ~  labs 10/11 on Metform1000Bid+Gybur5Bid showed BS= 180, A1c= 8.4... recMarland KitchenMarland Kitcheno incr GLYBUR10Bid + diet. ~  labs 6/12 on Metform1000Bid+Glybur10Bid showed BS= 196, A1c= 11.2 ?what happened?> he declined insulin & Endocrine consult, opted for max oral meds by adding ACTOS 30mg/d30mNGLYZA 5mg/d..76m  [place] and A1c= 7.2; covered w/ SSI in hosp & placed back on 4 oral meds on disch from rehab... ~  Labs 1/13 on all 4 meds showed BS= 230, A1c=7.1; we reviewed diet & he will start PT soon... ~  Labs 3/13 on 4meds sh26md BS= 72, A1c= 6.6 ~  4/13: pt called w/ hypoglycemia & we decreased Glyburide 5mg from 9md to 1Bid... ~  6/13:  Pt  on Metform500-2Bid, Glybur5mgBid, Ac32m30, Onglyza5; BS at home 70-90 he says & still drinking Boost; Labs showed BS=190  A1c=7.4  ; we decided to cut the Glyburide5mg Qam onl2mut keep the Metform500-2Bid +Actos +Onglyza... ~  10/13: on his 75mds> BS=171, A1c=7.7 but he's gained 16#; told to STOP the boost, get on diet, get wt down, take all meds every day... ~  10/14: seen by TP> Labs 10/14 showed BS=252, A1c=9.1 & Lantus added to his Metform500-2Bid, Diabeta5, Onglyza5; Lantus10u added... ~  11/14: on Lantus10u, Metform500-2Bid, Diabeta5, Onglyza5, off Actos; f/u labs 11/14 showed BS=182, A1c=8.3; continue same, get wt down. ~  8/15: on Lantus10u, Metform500-2Bid, Diabeta5, Onglyza5; Labs 8/15 showed BS=167, A1c=7.3..Marland KitchenMarland KitchenContinue meds and get wt down. ~  12/15: now on Lantus15u now, Metform500-2Bid, Diabeta5AM, Onglyza5PM; compliance w/ meds was poor when taking his own meds; Labs 12/15 showed BS=146-665, A1c=9.3 => now all supervised by wife. ~  2/16: on Lantus15u now, Metform500-2Bid, Diabeta5AM, Onglyza5PM; Labs 2/16 showed BS=96, A1c=7.6, therefore continue same meds & diet... ~  5/16: on Lantus15u, Metform500-2Bid, Diabeta5AM, Onglyza5PM; compliance w/ meds was poor but wife supervises now; Labs 5/16 shows BS=130, A1c=6.8 => much improved, continue same. ~  11/16: on Lantus15u, Metform500-2Bid, Diabeta5AM, Onglyza5PM; wife supervises meds; Labs 11/16 showed BS=226, A1c=6.7, continue same meds, better diet. ~  5/17: on same meds & doing satis;  Labs 5/17 showed random BS=142, A1c wasn't done as requested...  OVERWEIGHT (ICD-278.02) - he states that he is not on much of a diet & we reviewed low carb/ no sweets/ low fat diet. ~  weight 8/09 = 216# ~  weight 2/10 = 216# ~  weight 12/10 = 212# ~  weight 10/11 = 210# ~  Weight 6/12 = 206# and he admits to not being on much of a diet... ~  Weight 2/13 = non weight bearing still... ~  Weight 3/13 = 193# ~  Weight 6/13 = 191# ~  Weight 10/13 =  206# ~  Weight 11/14 = 198# ~  Weight 2/15 = 193# ~  Weight 1/16 = 181# ~  Weight 5/16 = 189# ~  Weight 11/16 = 189# ~  Weight 5/17 = 173# good job w/ wt reduction, diet, exercise...  GERD (ICD-530.81) - prev on Nexium40, he switched to OTC Prilosec, now PQuonochontaug445md due to Plavix Rx; pt had stopped this on his own & asked to restart 10/13. ~  EGD 19/03 showed 4cMcGregorGERD & esoph stricture dilated... ~  EGD 12/09 by DrPatterson showed HH & severe erosive esophagitis from GERD... ~  10/13: presents w/ incr reflux- w/ dysphagia & food sticking in mid-esoph that occurs every 2-3days & has to vomit to feel better; he had stopped his PPI on his own (?why?), rec to restart the Protonix40 & we will refer to GI for EGD & dilatation... ~  10/13: he saw DrPatterson> EGD 11/13 showed HH, chr GERD, stricture dil w/ 6F Maloney dilator, erosive gastritis was HPylori neg & treated w/ daily PPI rx...  ~  He remains on Protonix40/d & doing satis w/o reflux symptoms...  DIVERTICULOSIS OF COLON (ICD-562.10) - note: [place]; prev [place] recently noted loose stools on the Metformin 10008md... ~  colonoscopy 10/03 by DrPatterson showed divertics only... ~  colonoscopy 12/09 by DrPatterson was also normal x for divertics... ~  He denies n/v, c/d, blood seen...  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 Hx of RENAL CELL CANCER (ICD-189.0) - he had a partial nephrectomy of the right side of his horseshoe kidney in 1994 due to renal cell ca... still followed by DrPAlcoaarly by his  hx but we don't have any recent notes... ~  labs 10/11 showed PSA= 0.95 ~  Labs 3/13 showed BUN=21, Creat=1.1, PSA=2.75 ~  Labs 10/13 showed BUN= 25, Creat= 1.2 ~  Labs 10/14 showed BUN=38, Cr=1.5 (w/ enterococcus UTI);  Labs 11/14 showed BUN=29, Cr=1.2 ~  Labs 8/15 showed BUN= 24, Cr= 1.2 ~  Labs 12/15 showed Cr=  1.1-1.4 ~  Labs 2/16 showed BUN= 18, Cr= 1.12  Hx of NEPHROLITHIASIS (ICD-592.0) - remote hx of kidney stones followed by DrPeterson...  DEGENERATIVE JOINT DISEASE (ICD-715.90) &  GOUT (ICD-274.9) - on ALLOPURINOL 372m/d... ~  labs 10/11 showed URIC= 4.7 ~  [facility] fall from ladder cleaning gutters w/ complex right tibial plat fx- ORIF, 2 operations by DrHardy, & hosp course complic by ileus, TIA, electrolyte abn, etc;  [facility]  BACK PAIN, LUMBAR (ICD-724.2) - known degenerative spondylitic disease at L4-5 and L5-S1 [facility]now DrElsner & DrHarkins)... ~  6/12: he reports further back pain w/ eval by Chiropractor showing L4 disc prob & regular adjustments; but now notes incr pain & he requests referral to Neurosurg for further eval ==> seen by DrElsner, then DrHarkins for shots which has helped... ~  2014: he has continued f/u w/ DrHarkins/ [facility] & received periodic ESI injections for his Lumbar DDD; they added NORTRIPTYLINE 250mhs & slowly incr the dose  NEURO >> Hx Left Hemispheric TIA in 11/12 & MEMORY IMPAIRMENT ~  3/14:  He had Neuro eval DrSethi> he did Labs, EEG, MRI Brain, & MRA Brain & Neck; Rec to take 2 FishOil caps daily, CerefolinNAC daily, work puzzles etc, continue Plavix & strict control of BP, Chol, DM; they did not start Aricept... ~  11/15 & 12/15> see above> Hosp w/ wastershed strokes left frontal/ temporal/ parietal areas when he wasn't taking his Plavix; now all meds supervised by wife; he had MRIs, CT Angio, and Arteriograms w/ attempted intervention on left by DrTDeveshwar- unsuccessful & rec for continued ASA/ Plavix, & control on BP, Chol, DM w/ BP in the 130-160 range... 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 ~  6/16: Eval by [place] [facility], c/w mild cognitive  impairment w/ MOCA=20/30, started on Aricept & B12 supplement... ~  4/17: f/u DrAquino 08/02/15- reviewed, tolerating Aricept, stable & independent w/ ADLs, drives a little, MMSLHT=34/28no ch in meds.   ANXIETY (ICD-300.00) - uses Zolloft50 & ALPRAZOLAM 0.88m77ms needed.  B12 DEFICIENCY >>  ~  5/16:  Labs showed B12 = 196 & rec to start 1000m63my mouth daily & we will f/u labs... ~  11/16: f/u labs showed B12 = 426, continue B12 supplement daily... ~  Labs 5/17:  CBC showed Hg= 11.1, MCV=94, and rec to add 1-a-day w/ Fe to the B12 daily supplement.   Past Surgical History:  Procedure Laterality Date  . CAROTID ENDARTERECTOMY  09/2000   Dr.Lawson  . CHOLECYSTECTOMY  9/04   Dr. LeonRebekah ChesterfieldEXTERNAL FIXATION LEG  03/10/2011   Procedure: EXTERNAL FIXATION LEG;  Surgeon: MichRozanna Boxocation: [facility] OLyfordervice: Orthopedics;  Laterality: Right;  . INCISIONAL HERNIA REPAIR  1/96   Dr.Leone  . NASAL SINUS SURGERY  12/96   Dr.Redman  . 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  ORIF TIBIA PLATEAU  03/24/2011   Procedure: OPEN REDUCTION INTERNAL FIXATION (ORIF) TIBIAL PLATEAU;  Surgeon: Rozanna Box;  Location: [place];  Service: Orthopedics;  Laterality: Right;  . RADIOLOGY WITH ANESTHESIA N/A 04/12/2014   Procedure: ANGIOPLASTY;  Surgeon: Rob Hickman, MD;  Location: [place];  Service: Radiology;  Laterality: N/A;  . SMALL INTESTINE SURGERY    . veterbral art angioplasty     x2    Outpatient Encounter Medications as of 10/07/2017  Medication Sig  . ALPRAZolam (XANAX) 0.5 MG tablet TAKE 0.5-1 TABLET BY MOUTH THREE TIMES ADAY (Patient taking differently: TAKE 0.5-1 TABLET BY MOUTH TWICE  ADAY)  . aspirin EC 81 MG EC tablet Take 1 tablet (81 mg total) by mouth daily.  Marland Kitchen atorvastatin (LIPITOR) 80 MG tablet Take 1 tablet (80 mg total) by mouth daily at 6 PM.  . cholecalciferol (VITAMIN D) 1000 units tablet Take 2,000 Units by mouth daily.  . clopidogrel  (PLAVIX) 75 MG tablet TAKE 1 TABLET BY MOUTH EVERY DAY  . donepezil (ARICEPT) 10 MG tablet Take 1 tablet (10 mg total) by mouth daily.  Marland Kitchen glipiZIDE (GLUCOTROL XL) 5 MG 24 hr tablet Take 1 tablet (5 mg total) by mouth daily with breakfast.  . Insulin Pen Needle (B-D ULTRAFINE III SHORT PEN) 31G X 8 MM MISC Use with the lantus solostar pen as directed  . LANTUS SOLOSTAR 100 UNIT/ML Solostar Pen INJECT 10 UNITS INTO THE SKIN AT BEDTIME. (Patient taking differently: INJECT 15 UNITS INTO THE SKIN AT BEDTIME.)  . metFORMIN (GLUCOPHAGE) 500 MG tablet TAKE 2 TABLETS TWICE A DAY  . ONE TOUCH ULTRA TEST test strip USE AS INSTRUCTED  . pantoprazole (PROTONIX) 40 MG tablet TAKE 1 TABLET (40 MG TOTAL) BY MOUTH DAILY.  . saxagliptin HCl (ONGLYZA) 5 MG TABS tablet Take 1 tablet (5 mg total) by mouth daily.  . sertraline (ZOLOFT) 50 MG tablet TAKE 1 TABLET BY MOUTH EVERYDAY AT BEDTIME   No facility-administered encounter medications on file as of 10/07/2017.     Allergies  Allergen Reactions  . Dilaudid [Hydromorphone Hcl] Other (See Comments)    hallucinations  . Fentanyl Other (See Comments)    hallucinations    Immunization History  Administered Date(s) Administered  . Influenza Split 01/28/2011, 01/18/2012, 02/25/2015  . Influenza Whole 01/07/2010  . Influenza,inj,Quad PF,6+ Mos 01/19/2013, 02/27/2014, 06/08/2016, 12/08/2016  . Pneumococcal Conjugate-13 03/28/2011    Current Medications, Allergies, Past Medical History, Past Surgical History, Family History, and Social History were reviewed in [employer] record.    Review of Systems        See HPI - all other systems neg except as noted...  The patient complains of cyst on back & dyspnea on exertion.  The patient denies anorexia, fever, weight loss, weight gain, vision loss, decreased hearing, hoarseness, chest pain, syncope, peripheral edema, prolonged cough, headaches, hemoptysis, abdominal pain, melena, hematochezia,  severe indigestion/heartburn, hematuria, incontinence, muscle weakness, suspicious skin lesions, transient blindness, difficulty walking, depression, unusual weight change, abnormal bleeding, enlarged lymph nodes, and angioedema.     Objective:   Physical Exam     WD, WN, 77 y/o WM in NAD...  GENERAL:  Alert & oriented; pleasant & cooperative... HEENT:  [place]/AT, EOM-wnl, PERRLA, EACs-clear, TMs-wnl, NOSE-clear, THROAT-clear & wnl. NECK:  Supple w/ fairROM; no JVD; s/p left CAE, +bruits; no thyromegaly or nodules palpated; no lymphadenopathy. CHEST:  Clear to P & A; without wheezes/ rales/ or rhonchi heard... HEART:  Regular  Rhythm; gr 1/6 SEM without rubs or gallops detected... ABDOMEN:  Soft & nontender; normal bowel sounds; no organomegaly or masses palpated... EXT: right knee deformity s/p ORIF, mod arthritic changes; no varicose veins/ +venous insuffic/ 1+ edema. NEURO:  CN's intact; no focal neuro deficits... DERM:  Quarter sized sebaceous cyst in mid back.   RADIOLOGY DATA:  Reviewed in the EPIC EMR & discussed w/ the patient...  LABORATORY DATA:  Reviewed in the EPIC EMR & discussed w/ the patient...   Assessment & Plan:    BP>  Neuro has wanted his BP to be sl higher than usual for perfusion reasons but BP= 170/80 on arrival & improved to 150/80 w/ rest; we opted to start Losartan 81m/d for BP & vasc dis... 5/16> BP= 144/76 today, continue Losar50... 9/16> BP= 120/60 & he remains asymptomatic... ~  Subsequent ROVs w/ stable BP on Losartan50 + diet, wt reduction...  CEREBROVASC Disease & STROKES 7/17, 11/15 & TIA 12/12>  Known severe cerebrovasc dis & he had TIA while in hosp for fx leg 2012 (he was off plavix at the time); Recurrent prob 11/15 w/ watershed infarcts in left frontal/ temporal/ parietal regions while he was off Plavix (he was taking meds on his own & hadn't been filling the Plavix); s/p extensive eval w/ MRI/MRA, CT Angio & Arteriogram by DLadene Artistw/ attempt at  intervention on left unsuccessful; wife now supervises all meds... He has prob multi-infarct dementia & wife requests Neuro 2nd opinion => [facility]   JWitthas had a relatively quiet/ stable interval & is doing satis on his current regimen;  On ASA/ Plavix & Losar50 w/ adeq BP control x "white coat" (reminded no salt);  DM treated w/ Lantus 15u + 3 meds & labs are reasonably well maintained;  Continue same meds, PLEASE incr phys activity & mental exercises, rov 662mo/27/18>    JaVearlemains stable on current regimen- BP is ok, DM cotrol is good;  Major prob is his atherosclerotic dis & cerebrovasc changes w/ mult occlusions, strokes, etc-- follwed by Neuro-DrAquino, and IR-DrDeveshwar... Rec to continue same meds and follow up visits 10/07/17>   He clearly cannot be trusted w/ guns & wife is advised to keep all under lock & key where he does not have access;  He appears stable on current meds and his known severe cerebrovasc dis and mult medical problems... we discussed my up-coming retirement at the end of 2019 & the need to obtain another PCP to pick-up on all is issues-- HBP, ceerebrovasc dis, HL, DM, vasc dementia, GU issues and anemia... we will plan recheck in 58m83mo be sure he is stable.   NEURO> Mild Cognitive Impairment per DrSethi & wife is concerned re: memory/ vascular dementia; we will arrange for Neuro 2nd opinion=> started on Aricept & stable...  CHOL>  on Atrova80 now & f/u FLP much improved...  DM>  On Lantus15, Metform, Diabeta, Onglyza & compliance is better w/ wife supervision; Labs stable w/ A1c in the 6's...  Overweight>  We discussed low carb, low fat wt reducing diet;  Nice job w/ wt down to 173# 5/17 OV...  GI>  GERD prev controlled w/ Protonix but he stopped on his own (?why?); EGD 11/13 w/ dilatation & reminded to take the Protonix40 every day...  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  DJD/ Hx  Gout/ LBP>  He was seeing chiropractor regularly regarding LBP &  then f/u w/ DrElsner,Neurosurg & had ESI by DrHarkins & the shots have helped...  s/p fall w/ right Tibial plateaux fx> 1st ext fixation, 2nd ORIF>  By DrHardy, Ortho; exercise is difficult but very much needed; he's had extensive PT training...  Anxiety>  He remains on Alpraz Prn... Also has [place] on his list of meds...  B12 deficiency>  Labs 5/16 w/ B12 level = 196; rec to start oral B12 supplement ~1058mg/d & repeat B12 level 11/16= 426   Patient's Medications  New Prescriptions   No medications on file  Previous Medications   ALPRAZOLAM (XANAX) 0.5 MG TABLET    TAKE 0.5-1 TABLET BY MOUTH THREE TIMES ADAY   ASPIRIN EC 81 MG EC TABLET    Take 1 tablet (81 mg total) by mouth daily.   ATORVASTATIN (LIPITOR) 80 MG TABLET    Take 1 tablet (80 mg total) by mouth daily at 6 PM.   CHOLECALCIFEROL (VITAMIN D) 1000 UNITS TABLET    Take 2,000 Units by mouth daily.   CLOPIDOGREL (PLAVIX) 75 MG TABLET    TAKE 1 TABLET BY MOUTH EVERY DAY   DONEPEZIL (ARICEPT) 10 MG TABLET    Take 1 tablet (10 mg total) by mouth daily.   GLIPIZIDE (GLUCOTROL XL) 5 MG 24 HR TABLET    Take 1 tablet (5 mg total) by mouth daily with breakfast.   INSULIN PEN NEEDLE (B-D ULTRAFINE III SHORT PEN) 31G X 8 MM MISC    Use with the lantus solostar pen as directed   LANTUS SOLOSTAR 100 UNIT/ML SOLOSTAR PEN    INJECT 10 UNITS INTO THE SKIN AT BEDTIME.   METFORMIN (GLUCOPHAGE) 500 MG TABLET    TAKE 2 TABLETS TWICE A DAY   ONE TOUCH ULTRA TEST TEST STRIP    USE AS INSTRUCTED   PANTOPRAZOLE (PROTONIX) 40 MG TABLET    TAKE 1 TABLET (40 MG TOTAL) BY MOUTH DAILY.   SAXAGLIPTIN HCL (ONGLYZA) 5 MG TABS TABLET    Take 1 tablet (5 mg total) by mouth daily.   SERTRALINE (ZOLOFT) 50 MG TABLET    TAKE 1 TABLET BY MOUTH EVERYDAY AT BEDTIME  Modified Medications   No medications on file  Discontinued Medications   No medications on file

## 2017-10-25 ENCOUNTER — Other Ambulatory Visit: Payer: Self-pay | Admitting: Pulmonary Disease

## 2017-11-01 ENCOUNTER — Other Ambulatory Visit: Payer: Self-pay | Admitting: Pulmonary Disease

## 2017-11-08 NOTE — Telephone Encounter (Signed)
CY please advise, as SN is unavailable.   Last refilled 04/22/17 #90 with 5 refills. 0.5 to 1 tab tid. Last OV 09/27/17 with no pending OV.

## 2017-11-08 NOTE — Telephone Encounter (Signed)
Ok to refill x 1 month

## 2017-11-08 NOTE — Telephone Encounter (Signed)
Rx for Xanax 0.5mg  has been phoned in to [facility]. Nothing further is needed.

## 2017-11-10 DIAGNOSIS — L259 Unspecified contact dermatitis, unspecified cause: Secondary | ICD-10-CM | POA: Diagnosis not present

## 2017-11-23 ENCOUNTER — Ambulatory Visit: Payer: Medicare Other | Admitting: Pulmonary Disease

## 2017-11-23 ENCOUNTER — Ambulatory Visit (INDEPENDENT_AMBULATORY_CARE_PROVIDER_SITE_OTHER): Payer: Medicare Other | Admitting: Pulmonary Disease

## 2017-11-23 ENCOUNTER — Encounter: Payer: Self-pay | Admitting: Pulmonary Disease

## 2017-11-23 VITALS — BP 100/58 | HR 88 | Ht 66.0 in | Wt 163.4 lb

## 2017-11-23 DIAGNOSIS — L02212 Cutaneous abscess of back [any part, except buttock]: Secondary | ICD-10-CM | POA: Diagnosis not present

## 2017-11-23 MED ORDER — DOXYCYCLINE HYCLATE 100 MG PO TABS: 100.0000 mg | ORAL_TABLET | Freq: Two times a day (BID) | ORAL | 0 refills | Status: DC

## 2017-11-23 NOTE — Assessment & Plan Note (Signed)
Doxycycline >>> 1 100 mg tablet every 12 hours for 7 days >>>take with food  >>>wear sunscreen   Referral to surgery for abscess drainage

## 2017-11-23 NOTE — Patient Instructions (Addendum)
Doxycycline >>> 1 100 mg tablet every 12 hours for 7 days >>>take with food  >>>wear sunscreen   Referral to surgery for abscess drainage      Please contact the office if your symptoms worsen or you have concerns that you are not improving.   Thank you for choosing [facility] for your healthcare, and for allowing [place] to partner with you on your healthcare journey. I am thankful to be able to provide care to you today.   Peter Quaker FNP-C   Skin Abscess A skin abscess is an infected area on or under your skin that contains pus and other material. An abscess can happen almost anywhere on your body. Some abscesses break open (rupture) on their own. Most continue to get worse unless they are treated. The infection can spread deeper into the body and into your blood, which can make you feel sick. Treatment usually involves draining the abscess. Follow these instructions at home: Abscess Care  If you have an abscess that has not drained, place a warm, clean, wet washcloth over the abscess several times a day. Do this as told by your doctor.  Follow instructions from your doctor about how to take care of your abscess. Make sure you: ? Cover the abscess with a bandage (dressing). ? Change your bandage or gauze as told by your doctor. ? Wash your hands with soap and water before you change the bandage or gauze. If you cannot use soap and water, use hand sanitizer.  Check your abscess every day for signs that the infection is getting worse. Check for: ? More redness, swelling, or pain. ? More fluid or blood. ? Warmth. ? More pus or a bad smell. Medicines   Take over-the-counter and prescription medicines only as told by your doctor.  If you were prescribed an antibiotic medicine, take it as told by your doctor. Do not stop taking the antibiotic even if you start to feel better. General instructions  To avoid spreading the infection: ? Do not share personal care items,  towels, or hot tubs with others. ? Avoid making skin-to-skin contact with other people.  Keep all follow-up visits as told by your doctor. This is important. Contact a doctor if:  You have more redness, swelling, or pain around your abscess.  You have more fluid or blood coming from your abscess.  Your abscess feels warm when you touch it.  You have more pus or a bad smell coming from your abscess.  You have a fever.  Your muscles ache.  You have chills.  You feel sick. Get help right away if:  You have very bad (severe) pain.  You see red streaks on your skin spreading away from the abscess. This information is not intended to replace advice given to you by your health care provider. Make sure you discuss any questions you have with your health care provider. Document Released: 09/16/2007 Document Revised: 11/24/2015 Document Reviewed: 02/06/2015 Elsevier Interactive Patient Education  [employer].

## 2017-11-23 NOTE — Progress Notes (Deleted)
@Patient  ID: Peter Nicholson, male    DOB: Nov 04, 1940, 77 y.o.   MRN: [id number]  No chief complaint on file.   Referring provider: Noralee Space, MD  HPI:  PMH: Maintenance:   Pt of:   Recent [facility] Encounters:     Tests:       Chart Review:       Allergies  Allergen Reactions  . Dilaudid [Hydromorphone Hcl] Other (See Comments)    hallucinations  . Fentanyl Other (See Comments)    hallucinations    Immunization History  Administered Date(s) Administered  . Influenza Split 01/28/2011, 01/18/2012, 02/25/2015  . Influenza Whole 01/07/2010  . Influenza,inj,Quad PF,6+ Mos 01/19/2013, 02/27/2014, 06/08/2016, 12/08/2016  . Pneumococcal Conjugate-13 03/28/2011    Past Medical History:  Diagnosis Date  . Anxiety state, unspecified   . Blood transfusion without reported diagnosis   . Bowel obstruction ([place])   . Calculus of kidney   . Cerebrovascular disease, unspecified   . Diaphragmatic hernia without mention of obstruction or gangrene   . Diverticulosis of colon (without mention of hemorrhage)   . Esophageal reflux   . Gout, unspecified   . History of gallstones   . History of seizures   . HTN (hypertension)   . IBS (irritable bowel syndrome)   . Kidney carcinoma ([place])   . Lumbago   . Obstructive sleep apnea (adult) (pediatric)    doesnt wear CPAP  . Osteoarthrosis, unspecified whether generalized or localized, unspecified site    tendonitis  . Other specified congenital anomaly of kidney   . Overweight(278.02)   . Pure hypercholesterolemia   . Seizures ([place])    last seizure November 2012  . Stroke ([place])    2001  . Type II or unspecified type diabetes mellitus without mention of complication, not stated as uncontrolled   . Unspecified venous (peripheral) insufficiency     Tobacco History: Social History   Tobacco Use  Smoking Status Never Smoker  Smokeless Tobacco Never Used   Counseling given: Not Answered   Outpatient  Encounter Medications as of 11/23/2017  Medication Sig  . ALPRAZolam (XANAX) 0.5 MG tablet TAKE 1/2 TO 1 TABLET BY MOUTH 3 TIMES DAILY  . aspirin EC 81 MG EC tablet Take 1 tablet (81 mg total) by mouth daily.  Marland Kitchen atorvastatin (LIPITOR) 80 MG tablet Take 1 tablet (80 mg total) by mouth daily at 6 PM.  . cholecalciferol (VITAMIN D) 1000 units tablet Take 2,000 Units by mouth daily.  . clopidogrel (PLAVIX) 75 MG tablet TAKE 1 TABLET BY MOUTH EVERY DAY  . donepezil (ARICEPT) 10 MG tablet Take 1 tablet (10 mg total) by mouth daily.  Marland Kitchen glipiZIDE (GLUCOTROL XL) 5 MG 24 hr tablet Take 1 tablet (5 mg total) by mouth daily with breakfast.  . Insulin Pen Needle (B-D ULTRAFINE III SHORT PEN) 31G X 8 MM MISC Use with the lantus solostar pen as directed  . LANTUS SOLOSTAR 100 UNIT/ML Solostar Pen INJECT 10 UNITS INTO THE SKIN AT BEDTIME. (Patient taking differently: INJECT 15 UNITS INTO THE SKIN AT BEDTIME.)  . metFORMIN (GLUCOPHAGE) 500 MG tablet TAKE 2 TABLETS TWICE A DAY  . ONE TOUCH ULTRA TEST test strip USE AS INSTRUCTED  . pantoprazole (PROTONIX) 40 MG tablet TAKE 1 TABLET (40 MG TOTAL) BY MOUTH DAILY.  . saxagliptin HCl (ONGLYZA) 5 MG TABS tablet Take 1 tablet (5 mg total) by mouth daily.  . sertraline (ZOLOFT) 50 MG tablet TAKE 1 TABLET BY MOUTH EVERYDAY  AT BEDTIME   No facility-administered encounter medications on file as of 11/23/2017.      Review of Systems  Review of Systems    Constitutional:   No  weight loss, night sweats,  fevers, chills, fatigue, or  lassitude HEENT:   No headaches,  Difficulty swallowing,  Tooth/dental problems, or  Sore throat, No sneezing, itching, ear ache, nasal congestion, post nasal drip  CV: No chest pain,  orthopnea, PND, swelling in lower extremities, anasarca, dizziness, palpitations, syncope  GI: No heartburn, indigestion, abdominal pain, nausea, vomiting, diarrhea, change in bowel habits, loss of appetite, bloody stools Resp: No shortness of breath with  exertion or at rest.  No excess mucus, no productive cough,  No non-productive cough,  No coughing up of blood.  No change in color of mucus.  No wheezing.  No chest wall deformity Skin: no rash, lesions, no skin changes. GU: no dysuria, change in color of urine, no urgency or frequency.  No flank pain, no hematuria  MS:  No joint pain or swelling.  No decreased range of motion.  No back pain. Psych:  No change in mood or affect. No depression or anxiety.  No memory loss.      Physical Exam  There were no vitals taken for this visit.  Wt Readings from Last 5 Encounters:  10/07/17 162 lb 12.8 oz (73.8 kg)  06/29/17 162 lb 8 oz (73.7 kg)  05/21/17 173 lb (78.5 kg)  12/07/16 174 lb (78.9 kg)  06/08/16 169 lb 2 oz (76.7 kg)     Physical Exam    GEN: A/Ox3; pleasant , NAD, well nourished, appears stated age 28:  [place]/AT,  EACs-clear, TMs-wnl, NOSE-clear, THROAT-clear, no lesions, no postnasal drip or exudate noted.  NECK:  Supple w/ fair ROM; no JVD; normal carotid impulses w/o bruits; no thyromegaly or nodules palpated; no lymphadenopathy.   RESP  Clear  P & A; w/o, wheezes/ rales/ or rhonchi. no accessory muscle use, no dullness to percussion CARD:  RRR, no m/r/g, no peripheral edema, pulses intact: radial 2+ bilaterally, DP 2+ bilaterally, no cyanosis or clubbing. GI:   Soft & nt; nml bowel sounds; no organomegaly or masses detected.  Musco: Warm bilaterally, no deformities or joint swelling noted.  Neuro: alert, no focal deficits noted.   Skin: Warm, no lesions or rashes     Lab Results:  CBC    Component Value Date/Time   WBC 9.9 06/27/2017 0156   RBC 3.51 (L) 06/27/2017 0156   HGB 11.0 (L) 06/27/2017 0156   HCT 33.8 (L) 06/27/2017 0156   PLT 217 06/27/2017 0156   MCV 96.3 06/27/2017 0156   MCH 31.3 06/27/2017 0156   MCHC 32.5 06/27/2017 0156   RDW 12.7 06/27/2017 0156   LYMPHSABS 0.9 12/07/2016 1358   MONOABS 0.6 12/07/2016 1358   EOSABS 0.4 12/07/2016 1358    BASOSABS 0.1 12/07/2016 1358    BMET    Component Value Date/Time   NA 138 06/27/2017 0156   K 4.2 06/27/2017 0156   CL 108 06/27/2017 0156   CO2 21 (L) 06/27/2017 0156   GLUCOSE 218 (H) 06/27/2017 0156   BUN 24 (H) 06/27/2017 0156   CREATININE 1.54 (H) 06/27/2017 0156   CALCIUM 9.4 06/27/2017 0156   CALCIUM 8.2 (L) 03/11/2011 0547   GFRNONAA 42 (L) 06/27/2017 0156   GFRAA 48 (L) 06/27/2017 0156    BNP No results found for: BNP  ProBNP No results found for: PROBNP  Imaging: No  results found.   Assessment & Plan:   No problem-specific Assessment & Plan notes found for this encounter.     Lauraine Rinne, NP 11/23/2017

## 2017-11-23 NOTE — Progress Notes (Addendum)
@Patient  ID: Peter Nicholson, male    DOB: Aug 19, 1940, 77 y.o.   MRN: [id number]  Chief Complaint  Patient presents with  . Acute Visit    Back sore    Referring provider: Noralee Space, MD  HPI: 77 year old male patient followed in our office by Dr. Lenna Gilford for primary care   Pt of: Dr. Lenna Gilford    11/23/17 Acute  77 year old patient seen today for acute visit for potential back abscess.  Patient wife report that 3 weeks ago patient had a rash on his back which was treated in urgent care by antibiotics and prednisone.  During treatment rash became itchy as it was resolving.  Patient was scratching his back.  Over the last 4 to 5 days abscess has developed according to patient.  No known sick contacts or injury to the site.  Potentially could be from patient scratching his back.   Allergies  Allergen Reactions  . Dilaudid [Hydromorphone Hcl] Other (See Comments)    hallucinations  . Fentanyl Other (See Comments)    hallucinations    Immunization History  Administered Date(s) Administered  . Influenza Split 01/28/2011, 01/18/2012, 02/25/2015  . Influenza Whole 01/07/2010  . Influenza,inj,Quad PF,6+ Mos 01/19/2013, 02/27/2014, 06/08/2016, 12/08/2016  . Pneumococcal Conjugate-13 03/28/2011    Past Medical History:  Diagnosis Date  . Anxiety state, unspecified   . Blood transfusion without reported diagnosis   . Bowel obstruction ([place])   . Calculus of kidney   . Cerebrovascular disease, unspecified   . Diaphragmatic hernia without mention of obstruction or gangrene   . Diverticulosis of colon (without mention of hemorrhage)   . Esophageal reflux   . Gout, unspecified   . History of gallstones   . History of seizures   . HTN (hypertension)   . IBS (irritable bowel syndrome)   . Kidney carcinoma ([place])   . Lumbago   . Obstructive sleep apnea (adult) (pediatric)    doesnt wear CPAP  . Osteoarthrosis, unspecified whether generalized or localized, unspecified site    tendonitis  . Other specified congenital anomaly of kidney   . Overweight(278.02)   . Pure hypercholesterolemia   . Seizures ([place])    last seizure November 2012  . Stroke ([place])    2001  . Type II or unspecified type diabetes mellitus without mention of complication, not stated as uncontrolled   . Unspecified venous (peripheral) insufficiency     Tobacco History: Social History   Tobacco Use  Smoking Status Never Smoker  Smokeless Tobacco Never Used   Counseling given: Not Answered Continue not smoking  Outpatient Encounter Medications as of 11/23/2017  Medication Sig  . ALPRAZolam (XANAX) 0.5 MG tablet TAKE 1/2 TO 1 TABLET BY MOUTH 3 TIMES DAILY  . aspirin EC 81 MG EC tablet Take 1 tablet (81 mg total) by mouth daily.  Marland Kitchen atorvastatin (LIPITOR) 80 MG tablet Take 1 tablet (80 mg total) by mouth daily at 6 PM.  . cholecalciferol (VITAMIN D) 1000 units tablet Take 2,000 Units by mouth daily.  . clopidogrel (PLAVIX) 75 MG tablet TAKE 1 TABLET BY MOUTH EVERY DAY  . donepezil (ARICEPT) 10 MG tablet Take 1 tablet (10 mg total) by mouth daily.  Marland Kitchen glipiZIDE (GLUCOTROL XL) 5 MG 24 hr tablet Take 1 tablet (5 mg total) by mouth daily with breakfast.  . Insulin Pen Needle (B-D ULTRAFINE III SHORT PEN) 31G X 8 MM MISC Use with the lantus solostar pen as directed  . LANTUS SOLOSTAR 100 UNIT/ML  Solostar Pen INJECT 10 UNITS INTO THE SKIN AT BEDTIME. (Patient taking differently: INJECT 15 UNITS INTO THE SKIN AT BEDTIME.)  . metFORMIN (GLUCOPHAGE) 500 MG tablet TAKE 2 TABLETS TWICE A DAY  . ONE TOUCH ULTRA TEST test strip USE AS INSTRUCTED  . pantoprazole (PROTONIX) 40 MG tablet TAKE 1 TABLET (40 MG TOTAL) BY MOUTH DAILY.  . saxagliptin HCl (ONGLYZA) 5 MG TABS tablet Take 1 tablet (5 mg total) by mouth daily.  . sertraline (ZOLOFT) 50 MG tablet TAKE 1 TABLET BY MOUTH EVERYDAY AT BEDTIME  . doxycycline (VIBRA-TABS) 100 MG tablet Take 1 tablet (100 mg total) by mouth 2 (two) times daily.   No  facility-administered encounter medications on file as of 11/23/2017.      Review of Systems  Review of Systems  Constitutional: Negative for activity change, chills, fatigue and fever.  HENT: Negative for congestion.   Respiratory: Negative for cough, shortness of breath and wheezing.   Cardiovascular: Negative for chest pain and palpitations.  Gastrointestinal: Negative for diarrhea, nausea and vomiting.  Genitourinary: Negative for hematuria.  Musculoskeletal: Negative for arthralgias.  Skin: Negative for color change.  Neurological: Negative for dizziness and headaches.  Psychiatric/Behavioral: Negative for dysphoric mood. The patient is not nervous/anxious.   All other systems reviewed and are negative.     Physical Exam  BP (!) 100/58   Pulse 88   Ht 5\' 6"  (1.676 m)   Wt 163 lb 6.4 oz (74.1 kg)   SpO2 100%   BMI 26.37 kg/m    Temp: 97.8  Wt Readings from Last 5 Encounters:  11/23/17 163 lb 6.4 oz (74.1 kg)  10/07/17 162 lb 12.8 oz (73.8 kg)  06/29/17 162 lb 8 oz (73.7 kg)  05/21/17 173 lb (78.5 kg)  12/07/16 174 lb (78.9 kg)    Physical Exam  Constitutional: He is oriented to person, place, and time and well-developed, well-nourished, and in no distress. No distress.  HENT:  Head: Normocephalic and atraumatic.  Right Ear: Hearing, tympanic membrane, external ear and ear canal normal.  Left Ear: Hearing, tympanic membrane, external ear and ear canal normal.  Mouth/Throat: Uvula is midline. No oropharyngeal exudate.  Eyes: Pupils are equal, round, and reactive to light.  Neck: Normal range of motion. Neck supple.  Cardiovascular: Normal rate, regular rhythm and normal heart sounds.  Pulmonary/Chest: Effort normal and breath sounds normal. No accessory muscle usage. No respiratory distress. He has no decreased breath sounds. He has no wheezes. He has no rhonchi.  Abdominal: Soft. Bowel sounds are normal. There is no tenderness.  Musculoskeletal: Normal range of  motion. He exhibits no edema.  Neurological: He is alert and oriented to person, place, and time. Gait normal.  Skin: Skin is warm and dry. He is not diaphoretic.     Psychiatric: Mood, memory, affect and judgment normal.  Nursing note and vitals reviewed.     Lab Results:  CBC    Component Value Date/Time   WBC 9.9 06/27/2017 0156   RBC 3.51 (L) 06/27/2017 0156   HGB 11.0 (L) 06/27/2017 0156   HCT 33.8 (L) 06/27/2017 0156   PLT 217 06/27/2017 0156   MCV 96.3 06/27/2017 0156   MCH 31.3 06/27/2017 0156   MCHC 32.5 06/27/2017 0156   RDW 12.7 06/27/2017 0156   LYMPHSABS 0.9 12/07/2016 1358   MONOABS 0.6 12/07/2016 1358   EOSABS 0.4 12/07/2016 1358   BASOSABS 0.1 12/07/2016 1358    BMET    Component Value Date/Time  NA 138 06/27/2017 0156   K 4.2 06/27/2017 0156   CL 108 06/27/2017 0156   CO2 21 (L) 06/27/2017 0156   GLUCOSE 218 (H) 06/27/2017 0156   BUN 24 (H) 06/27/2017 0156   CREATININE 1.54 (H) 06/27/2017 0156   CALCIUM 9.4 06/27/2017 0156   CALCIUM 8.2 (L) 03/11/2011 0547   GFRNONAA 42 (L) 06/27/2017 0156   GFRAA 48 (L) 06/27/2017 0156    BNP No results found for: BNP  ProBNP No results found for: PROBNP  Imaging: No results found.   Assessment & Plan:   Pleasant 77 year old patient seen office today with back abscess.  Will start patient on doxycycline.  Patient to be referred to general surgery for drainage.  Explained to patient as well as spouse that if fevers develop a more systemic symptoms that he needs to present to an urgent care or emergency room.  Unfortunately we do not have the supplies to numb as well as drain this site in our office.  Abscess of back Doxycycline >>> 1 100 mg tablet every 12 hours for 7 days >>>take with food  >>>wear sunscreen   Referral to surgery for abscess drainage        Lauraine Rinne, NP 11/23/2017

## 2017-11-24 ENCOUNTER — Telehealth: Payer: Self-pay | Admitting: Pulmonary Disease

## 2017-11-24 NOTE — Telephone Encounter (Signed)
Thank you.  Grateful that he was able to get in so soon.  I will update Dr. Lenna Gilford.  Wyn Quaker, FNP

## 2017-11-24 NOTE — Telephone Encounter (Signed)
Called and spoke to [facility] with [facility]. Pam wanted to make our office aware that pt has been scheduled for 11/25/17 at 11:15a, as they were able to move pt's apt up after speaking with Dr. Lilia Pro. Pt is aware of appointment change per Pam.    Will route to Aaron Edelman to make aware.

## 2017-11-25 DIAGNOSIS — L02212 Cutaneous abscess of back [any part, except buttock]: Secondary | ICD-10-CM | POA: Diagnosis not present

## 2017-11-30 DIAGNOSIS — L02212 Cutaneous abscess of back [any part, except buttock]: Secondary | ICD-10-CM | POA: Diagnosis not present

## 2017-12-09 DIAGNOSIS — L02212 Cutaneous abscess of back [any part, except buttock]: Secondary | ICD-10-CM | POA: Diagnosis not present

## 2017-12-25 ENCOUNTER — Other Ambulatory Visit: Payer: Self-pay | Admitting: Pulmonary Disease

## 2017-12-29 ENCOUNTER — Telehealth: Payer: Self-pay | Admitting: Pulmonary Disease

## 2017-12-29 MED ORDER — ALPRAZOLAM 0.5 MG PO TABS: ORAL_TABLET | ORAL | 2 refills | Status: DC

## 2017-12-29 MED ORDER — METFORMIN HCL 500 MG PO TABS: 1000.0000 mg | ORAL_TABLET | Freq: Two times a day (BID) | ORAL | 2 refills | Status: DC

## 2017-12-29 NOTE — Telephone Encounter (Signed)
SN please advise patient is requesting refill on xanax. Last refill was given on 7.29.19 with quantity of 90 and 1 refill.

## 2017-12-29 NOTE — Telephone Encounter (Signed)
Per SN- ok for Alprazolam 0.5mg , take 1/2 to 1 tab TID, as needed for anxiety, #90 with 2 refills.  Prescription called in at preferred pharmacy, [facility] in [place].  ATC patient, left detailed message on VM. Nothing further at this time.

## 2017-12-29 NOTE — Telephone Encounter (Signed)
Spoke with Santiago Glad  I have refilled the metformin  I advised that the pt needs to come in and have his fasting labs  She will inform the pt  Nothing further needed

## 2018-02-01 ENCOUNTER — Other Ambulatory Visit: Payer: Self-pay | Admitting: Pulmonary Disease

## 2018-03-21 ENCOUNTER — Other Ambulatory Visit: Payer: Self-pay | Admitting: Pulmonary Disease

## 2018-03-23 ENCOUNTER — Other Ambulatory Visit (INDEPENDENT_AMBULATORY_CARE_PROVIDER_SITE_OTHER): Payer: Medicare Other

## 2018-03-23 DIAGNOSIS — Z0000 Encounter for general adult medical examination without abnormal findings: Secondary | ICD-10-CM | POA: Diagnosis not present

## 2018-03-23 DIAGNOSIS — E7800 Pure hypercholesterolemia, unspecified: Secondary | ICD-10-CM

## 2018-03-23 DIAGNOSIS — I679 Cerebrovascular disease, unspecified: Secondary | ICD-10-CM | POA: Diagnosis not present

## 2018-03-23 DIAGNOSIS — Z794 Long term (current) use of insulin: Secondary | ICD-10-CM | POA: Diagnosis not present

## 2018-03-23 DIAGNOSIS — E119 Type 2 diabetes mellitus without complications: Secondary | ICD-10-CM

## 2018-03-23 LAB — CBC WITH DIFFERENTIAL/PLATELET
BASOS ABS: 0 10*3/uL (ref 0.0–0.1)
Basophils Relative: 0.3 % (ref 0.0–3.0)
EOS PCT: 4.3 % (ref 0.0–5.0)
Eosinophils Absolute: 0.3 10*3/uL (ref 0.0–0.7)
HEMATOCRIT: 29.2 % — AB (ref 39.0–52.0)
Hemoglobin: 9.7 g/dL — ABNORMAL LOW (ref 13.0–17.0)
Lymphocytes Relative: 14.8 % (ref 12.0–46.0)
Lymphs Abs: 1 10*3/uL (ref 0.7–4.0)
MCHC: 33.2 g/dL (ref 30.0–36.0)
MCV: 96.3 fl (ref 78.0–100.0)
MONO ABS: 0.6 10*3/uL (ref 0.1–1.0)
MONOS PCT: 8.6 % (ref 3.0–12.0)
NEUTROS PCT: 72 % (ref 43.0–77.0)
Neutro Abs: 5 10*3/uL (ref 1.4–7.7)
Platelets: 247 10*3/uL (ref 150.0–400.0)
RBC: 3.04 Mil/uL — ABNORMAL LOW (ref 4.22–5.81)
RDW: 12.7 % (ref 11.5–15.5)
WBC: 6.9 10*3/uL (ref 4.0–10.5)

## 2018-03-23 LAB — COMPREHENSIVE METABOLIC PANEL
ALK PHOS: 67 U/L (ref 39–117)
ALT: 18 U/L (ref 0–53)
AST: 21 U/L (ref 0–37)
Albumin: 3.8 g/dL (ref 3.5–5.2)
BILIRUBIN TOTAL: 0.3 mg/dL (ref 0.2–1.2)
BUN: 36 mg/dL — AB (ref 6–23)
CALCIUM: 9.1 mg/dL (ref 8.4–10.5)
CO2: 21 meq/L (ref 19–32)
Chloride: 109 mEq/L (ref 96–112)
Creatinine, Ser: 1.52 mg/dL — ABNORMAL HIGH (ref 0.40–1.50)
GFR: 47.33 mL/min — ABNORMAL LOW (ref >60.00)
Glucose, Bld: 149 mg/dL — ABNORMAL HIGH (ref 70–99)
Potassium: 4.6 mEq/L (ref 3.5–5.1)
Sodium: 139 mEq/L (ref 135–145)
TOTAL PROTEIN: 6.1 g/dL (ref 6.0–8.3)

## 2018-03-23 LAB — PSA: PSA: 1.12 ng/mL (ref 0.10–4.00)

## 2018-03-23 LAB — LIPID PANEL
CHOL/HDL RATIO: 2
CHOLESTEROL: 111 mg/dL (ref 0–200)
HDL: 52.6 mg/dL (ref >39.00)
LDL CALC: 45 mg/dL (ref 0–99)
NONHDL: 57.92
Triglycerides: 66 mg/dL (ref 0.0–149.0)
VLDL: 13.2 mg/dL (ref 0.0–40.0)

## 2018-03-23 LAB — HEMOGLOBIN A1C: HEMOGLOBIN A1C: 7.5 % — AB (ref 4.6–6.5)

## 2018-03-24 LAB — TSH: TSH: 4.18 u[IU]/mL (ref 0.35–4.50)

## 2018-03-25 ENCOUNTER — Other Ambulatory Visit: Payer: Self-pay | Admitting: Pulmonary Disease

## 2018-03-29 ENCOUNTER — Ambulatory Visit (INDEPENDENT_AMBULATORY_CARE_PROVIDER_SITE_OTHER): Payer: Medicare Other | Admitting: Pulmonary Disease

## 2018-03-29 ENCOUNTER — Encounter: Payer: Self-pay | Admitting: Pulmonary Disease

## 2018-03-29 VITALS — BP 142/68 | HR 60 | Temp 97.9°F | Ht 66.0 in | Wt 165.8 lb

## 2018-03-29 DIAGNOSIS — E7800 Pure hypercholesterolemia, unspecified: Secondary | ICD-10-CM

## 2018-03-29 DIAGNOSIS — I6523 Occlusion and stenosis of bilateral carotid arteries: Secondary | ICD-10-CM

## 2018-03-29 DIAGNOSIS — Q631 Lobulated, fused and horseshoe kidney: Secondary | ICD-10-CM

## 2018-03-29 DIAGNOSIS — I10 Essential (primary) hypertension: Secondary | ICD-10-CM | POA: Diagnosis not present

## 2018-03-29 DIAGNOSIS — C641 Malignant neoplasm of right kidney, except renal pelvis: Secondary | ICD-10-CM

## 2018-03-29 DIAGNOSIS — F0150 Vascular dementia without behavioral disturbance: Secondary | ICD-10-CM

## 2018-03-29 DIAGNOSIS — E119 Type 2 diabetes mellitus without complications: Secondary | ICD-10-CM

## 2018-03-29 DIAGNOSIS — Z794 Long term (current) use of insulin: Secondary | ICD-10-CM

## 2018-03-29 DIAGNOSIS — I63032 Cerebral infarction due to thrombosis of left carotid artery: Secondary | ICD-10-CM | POA: Diagnosis not present

## 2018-03-29 DIAGNOSIS — I679 Cerebrovascular disease, unspecified: Secondary | ICD-10-CM | POA: Diagnosis not present

## 2018-03-29 DIAGNOSIS — N183 Chronic kidney disease, stage 3 unspecified: Secondary | ICD-10-CM

## 2018-03-29 DIAGNOSIS — D638 Anemia in other chronic diseases classified elsewhere: Secondary | ICD-10-CM

## 2018-03-29 MED ORDER — ALPRAZOLAM 0.5 MG PO TABS: ORAL_TABLET | ORAL | 5 refills | Status: AC

## 2018-03-29 NOTE — Patient Instructions (Signed)
Today we updated your med list in our EPIC system...     We reviewed your recent FASTING blood work...  Your Diabetic control is slightly worse w/ FBS=149 & A1c=7.5.Marland KitchenMarland Kitchen    With the c/o diarrhea, we will decrease the Metformin 500mg  tabs to one tab twice daily at bereakfast & dinner.    We will also rec an increase in your LANTUS insulin to 20u once daily...  We will arrange for a Diabetic specialist to see you regarding your diabetic control...    We will set you up w/ [facility] for a DM consult in about 1 month (Jan2020)...  You will need to establish w/ a new Primary Care provider as well (due to my up-coming retirement) and let me know when you get that appt scheduled so I can send your records to the new provider...  It has been my honor to have been one of your doctors over these many years...    Wishing you & your family a very Merry Christmas and a happy & healthy New Year & beyond!!!

## 2018-03-29 NOTE — Progress Notes (Signed)
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 infarcts in left frontal/ temporal/ parietal lobes & old infarcts in cerebellum &  thalamus; DrTDeveshwar attempted intervention to left carotid system but this was reported as unsuccessful (unsucceeful attempt at revascularization of occluded LICA cavernous segment w/ multiple collaterals noted);  Last saw DrTDeveshwar 10/2014- note reviewed & 7/16 MRI/MRA & catheter angiogram reviewed=> tight focal stenosis of the right internal carotid artery distal cavernous segment, and the left vertebrobasilar junction suggestive of interval worsening. However no parenchymal changes suggestive of intervening or acute ischemic changes were noted on the DWI images- therefore they opted for continued medical management. ~  5/17: he remains on ASA81 + Plavix75 => clinically stable w/ his severe cerebrovasc dis... ~  10/2015:  He was HOSP at [facility] w/ lacunar infarct in left caudate nucleus => spont improvement, no change in meds, referred back to Neurology- DrAquino...  VENOUS INSUFFICIENCY (ICD-459.81) - he knows to avoid sodium, elevate legs, wear support hose...  HYPERCHOLESTEROLEMIA (ICD-272.0) - on CRESTOR 32m/d since Hosp 12/12... ~  FCoronita6/08 showed TChol 153, TG 120, HDL 36, LDL 93 ~  FLP 8/09 showed TChol 131, TG 84, HDL 38, LDL 76 ~  2010:  pt was not fasting for  blood work... rec to continue Crestor10 + diet efforts. ~  FLP 10/11 showed TChol 210, TG 161, HDL 40, LDL 149... rec to start Crestor 227md, he will decide. ~  FLP 6/12 on Cres20 showed TChol 106, TG 95, HDL 41, LDL 46... Continue same. ~  [place]2/12 in [facility] [place] Cres20 PTA) showed TChol 100, TG 101, HDL 37, LDL 43... They decr to Cres10. ~  FLP 3/13 on Cres10 showed TChol 118, TG 67, HDL 44, LDL 61... Continue same, later switched to Simva40 for $$ reasons. ~  FLP 10/14 on Simva40 showed TChol 136, TG 54, HDL 49, LDL 77... Continue same. ~  FLP 12/15 on Simva40 w/ ?compliance showed TChol 175, TG 105, HDL 41, LDL 113 => changed to AtHarper/ supervision of all meds by wife. ~  FLP 5/16 on Atorva80 showed TChol 113, TG 51, HDL 48, LDL 54 => much improved, continue the same.  DIABETES MELLITUS (ICD-250.00) >>  ~  on METFORMIN 50061m2Bid, GLYBURIDE 5mg66m, ACTOS 30mg31m& ONGLYZA 5mg/d80m ~  labs 6/08 showed BS= 155, HgA1c= 8.1... (glMarland Kitchenburide incr to 5Bid). ~  labs 11/08 showed BS= 352, HgA1c= 8.1.... (Actos added). ~  labs 8/09 showed BS= 128, HgA1c= 7.1... recMarland KitchenMarland Kitchensame meds, better diet, get wt down! ~  labs 2/10 showed BS= 249, A1c= 7.3.... Metform incr to 2Bid. ~  labs 12/10 (nonfasting- on all 3 meds) showed BS= 87, A1c= 6.9 ~  labs 10/11 on Metform1000Bid+Gybur5Bid showed BS= 180, A1c= 8.4... recMarland KitchenMarland Kitcheno incr GLYBUR10Bid + diet. ~  labs 6/12 on Metform1000Bid+Glybur10Bid showed BS= 196, A1c= 11.2 ?what happened?> he declined insulin & Endocrine consult, opted for max oral meds by adding ACTOS 30mg/d9mNGLYZA 5mg/d..107m  Labs 12/12 in Hosp shoPeerlessBS 150-200+ and A1c= 7.2; covered w/ SSI in hosp & placed back on 4 oral meds on disch from rehab... ~  Labs 1/13 on all 4 meds showed BS= 230, A1c=7.1; we reviewed diet & he will start PT soon... ~  Labs 3/13 on 4meds sh32md BS= 72, A1c= 6.6 ~  4/13: pt called w/ hypoglycemia & we decreased Glyburide 5mg from 14md to 1Bid... ~  6/13:  Pt on  Metform500-2Bid, Glybur5mgBid, Ac28m30, Onglyza5; BS at home 70-90 he says & still drinking Boost; Labs showed BS=190  A1c=7.4  ; we decided to cut the Glyburide5mg Qam onl61mut keep  the Metform500-2Bid +Actos +Onglyza... ~  10/13: on his 24mds> BS=171, A1c=7.7 but he's gained 16#; told to STOP the boost, get on diet, get wt down, take all meds every day... ~  10/14: seen by TP> Labs 10/14 showed BS=252, A1c=9.1 & Lantus added to his Metform500-2Bid, Diabeta5, Onglyza5; Lantus10u added... ~  11/14: on Lantus10u, Metform500-2Bid, Diabeta5, Onglyza5, off Actos; f/u labs 11/14 showed BS=182, A1c=8.3; continue same, get wt down. ~  8/15: on Lantus10u, Metform500-2Bid, Diabeta5, Onglyza5; Labs 8/15 showed BS=167, A1c=7.3..Marland KitchenMarland KitchenContinue meds and get wt down. ~  12/15: now on Lantus15u now, Metform500-2Bid, Diabeta5AM, Onglyza5PM; compliance w/ meds was poor when taking his own meds; Labs 12/15 showed BS=146-665, A1c=9.3 => now all supervised by wife. ~  2/16: on Lantus15u now, Metform500-2Bid, Diabeta5AM, Onglyza5PM; Labs 2/16 showed BS=96, A1c=7.6, therefore continue same meds & diet... ~  5/16: on Lantus15u, Metform500-2Bid, Diabeta5AM, Onglyza5PM; compliance w/ meds was poor but wife supervises now; Labs 5/16 shows BS=130, A1c=6.8 => much improved, continue same. ~  11/16: on Lantus15u, Metform500-2Bid, Diabeta5AM, Onglyza5PM; wife supervises meds; Labs 11/16 showed BS=226, A1c=6.7, continue same meds, better diet. ~  5/17: on same meds & doing satis;  Labs 5/17 showed random BS=142, A1c wasn't done as requested...  OVERWEIGHT (ICD-278.02) - he states that he is not on much of a diet & we reviewed low carb/ no sweets/ low fat diet. ~  weight 8/09 = 216# ~  weight 2/10 = 216# ~  weight 12/10 = 212# ~  weight 10/11 = 210# ~  Weight 6/12 = 206# and he admits to not being on much of a diet... ~  Weight 2/13 = non weight bearing still... ~  Weight 3/13 = 193# ~  Weight 6/13 = 191# ~  Weight 10/13 = 206# ~   Weight 11/14 = 198# ~  Weight 2/15 = 193# ~  Weight 1/16 = 181# ~  Weight 5/16 = 189# ~  Weight 11/16 = 189# ~  Weight 5/17 = 173# good job w/ wt reduction, diet, exercise...  GERD (ICD-530.81) - prev on Nexium40, he switched to OTC Prilosec, now PNor[place]462md due to Plavix Rx; pt had stopped this on his own & asked to restart 10/13. ~  EGD 19/03 showed 4cIndependenceGERD & esoph stricture dilated... ~  EGD 12/09 by DrPatterson showed HH & severe erosive esophagitis from GERD... ~  10/13: presents w/ incr reflux- w/ dysphagia & food sticking in mid-esoph that occurs every 2-3days & has to vomit to feel better; he had stopped his PPI on his own (?why?), rec to restart the Protonix40 & we will refer to GI for EGD & dilatation... ~  10/13: he saw DrPatterson> EGD 11/13 showed HH, chr GERD, stricture dil w/ 70F Maloney dilator, erosive gastritis was HPylori neg & treated w/ daily PPI rx...  ~  He remains on Protonix40/d & doing satis w/o reflux symptoms...  DIVERTICULOSIS OF COLON (ICD-562.10) - note: [place]; [place] noted loose stools on the Metformin 100047md... ~  colonoscopy 10/03 by DrPatterson showed divertics only... ~  colonoscopy 12/09 by DrPatterson was also normal x for divertics... ~  He denies n/v, c/d, blood seen...  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 Hx of RENAL CELL CANCER (ICD-189.0) - he had a partial nephrectomy of the right side of his horseshoe kidney in 1994 due to renal cell ca... still  followed [place] by his hx but we don't have any recent notes... ~  labs 10/11 showed PSA= 0.95 ~  Labs 3/13 showed BUN=21, Creat=1.1, PSA=2.75 ~  Labs 10/13 showed BUN= 25, Creat= 1.2 ~  Labs 10/14 showed BUN=38, Cr=1.5 (w/ enterococcus UTI);  Labs 11/14 showed BUN=29, Cr=1.2 ~  Labs 8/15 showed BUN= 24, Cr= 1.2 ~  Labs 12/15 showed Cr= 1.1-1.4 ~   Labs 2/16 showed BUN= 18, Cr= 1.12  Hx of NEPHROLITHIASIS (ICD-592.0) - remote hx of kidney stones followed by DrPeterson...  DEGENERATIVE JOINT DISEASE (ICD-715.90) &  GOUT (ICD-274.9) - on ALLOPURINOL 376m/d... ~  labs 10/11 showed URIC= 4.7 ~  [facility] fall from ladder cleaning gutters w/ complex right tibial plat fx- ORIF, 2 operations by DrHardy, & hosp course complic by ileus, TIA, electrolyte abn, etc;  Event [facility] & [facility]  BACK PAIN, LUMBAR (ICD-724.2) - known degenerative spondylitic disease at L4-5 and L5-S1 [facility]now DrElsner & DrHarkins)... ~  6/12: he reports further back pain w/ eval by Chiropractor showing L4 disc prob & regular adjustments; but now notes incr pain & he requests referral to Neurosurg for further eval ==> seen by DrElsner, then DrHarkins for shots which has helped... ~  2014: he has continued f/u w/ DrHarkins/ [facility] & received periodic ESI injections for his Lumbar DDD; they added NORTRIPTYLINE 293mhs & slowly incr the dose  NEURO >> Hx Left Hemispheric TIA in 11/12 & MEMORY IMPAIRMENT ~  3/14:  He had Neuro eval DrSethi> he did Labs, EEG, MRI Brain, & MRA Brain & Neck; Rec to take 2 FishOil caps daily, CerefolinNAC daily, work puzzles etc, continue Plavix & strict control of BP, Chol, DM; they did not start Aricept... ~  11/15 & 12/15> see above> Hosp w/ wastershed strokes left frontal/ temporal/ parietal areas when he wasn't taking his Plavix; now all meds supervised by wife; he had MRIs, CT Angio, and Arteriograms w/ attempted intervention on left by DrTDeveshwar- unsuccessful & rec for continued ASA/ Plavix, & control on BP, Chol, DM w/ BP in the 130-160 range... 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 ~  6/16: Eval by [place] [facility], c/w mild cognitive impairment w/  MOCA=20/30, started on Aricept & B12 supplement... ~  4/17: f/u DrAquino 08/02/15- reviewed, tolerating Aricept, stable & independent w/ ADLs, drives a little, MMUYQI=34/74no ch in meds.   ANXIETY (ICD-300.00) - uses Zolloft50 & ALPRAZOLAM 0.93m29ms needed.  B12 DEFICIENCY >>  ~  5/16:  Labs showed B12 = 196 & rec to start 1000m41my mouth daily & we will f/u labs... ~  11/16: f/u labs showed B12 = 426, continue B12 supplement daily... ~  Labs 5/17:  CBC showed Hg= 11.1, MCV=94, and rec to add 1-a-day w/ Fe to the B12 daily supplement.   Past Surgical History:  Procedure Laterality Date  . CAROTID ENDARTERECTOMY  09/2000   Dr.Lawson  . CHOLECYSTECTOMY  9/04   Dr. LeonRebekah ChesterfieldEXTERNAL FIXATION LEG  03/10/2011   Procedure: EXTERNAL FIXATION LEG;  Surgeon: MichRozanna Boxocation: [facility] OGen[place]ervice: Orthopedics;  Laterality: Right;  . INCISIONAL HERNIA REPAIR  1/96   Dr.Leone  . NASAL SINUS SURGERY  12/96   Dr.Redman  . NEPHRECTOMY  1994   renal cell cancer  in horseshoe kidney; right  . ORIF TIBIA PLATEAU  03/24/2011   Procedure: OPEN REDUCTION INTERNAL FIXATION (ORIF) TIBIAL PLATEAU;  Surgeon: Rozanna Box;  Location: [place];  Service: Orthopedics;  Laterality: Right;  . RADIOLOGY WITH ANESTHESIA N/A 04/12/2014   Procedure: ANGIOPLASTY;  Surgeon: Rob Hickman, MD;  Location: [place];  Service: Radiology;  Laterality: N/A;  . SMALL INTESTINE SURGERY    . veterbral art angioplasty     x2    Outpatient Encounter Medications as of 03/29/2018  Medication Sig  . ALPRAZolam (XANAX) 0.5 MG tablet 1/2-1 tab BID as needed for anxiety  . aspirin EC 81 MG EC tablet Take 1 tablet (81 mg total) by mouth daily.  Marland Kitchen atorvastatin (LIPITOR) 80 MG tablet TAKE 1 TABLET BY MOUTH DAILY AT 6PM  . cholecalciferol (VITAMIN D) 1000 units tablet Take 2,000 Units by mouth daily.  . clopidogrel (PLAVIX) 75 MG tablet TAKE 1 TABLET BY MOUTH EVERY DAY  . donepezil (ARICEPT) 10 MG tablet Take 1 tablet (10 mg  total) by mouth daily.  Marland Kitchen glipiZIDE (GLUCOTROL XL) 5 MG 24 hr tablet TAKE 1 TABLET (5 MG TOTAL) BY MOUTH DAILY WITH BREAKFAST.  Marland Kitchen Insulin Glargine (LANTUS SOLOSTAR [place]) Inject 20 Units into the skin at bedtime.  . Insulin Pen Needle (B-D ULTRAFINE III SHORT PEN) 31G X 8 MM MISC Use with the lantus solostar pen as directed  . metFORMIN (GLUCOPHAGE) 500 MG tablet Take 500 mg by mouth 2 (two) times daily with a meal.  . ONE TOUCH ULTRA TEST test strip USE AS INSTRUCTED  . pantoprazole (PROTONIX) 40 MG tablet TAKE 1 TABLET (40 MG TOTAL) BY MOUTH DAILY.  . saxagliptin HCl (ONGLYZA) 5 MG TABS tablet Take 1 tablet (5 mg total) by mouth daily.  . sertraline (ZOLOFT) 50 MG tablet TAKE 1 TABLET BY MOUTH EVERYDAY AT BEDTIME  . [DISCONTINUED] ALPRAZolam (XANAX) 0.5 MG tablet TAKE 1/2 TO 1 TABLET BY MOUTH 3 TIMES DAILY as needed  . [DISCONTINUED] ALPRAZolam (XANAX) 0.5 MG tablet Take 0.5 mg by mouth 2 (two) times daily as needed for anxiety. 1/2-1 tab BID as needed for anxiety  . [DISCONTINUED] doxycycline (VIBRA-TABS) 100 MG tablet Take 1 tablet (100 mg total) by mouth 2 (two) times daily.  . [DISCONTINUED] LANTUS SOLOSTAR 100 UNIT/ML Solostar Pen INJECT 10 UNITS INTO THE SKIN AT BEDTIME. (Patient taking differently: INJECT 15 UNITS INTO THE SKIN AT BEDTIME.)  . [DISCONTINUED] metFORMIN (GLUCOPHAGE) 500 MG tablet Take 2 tablets (1,000 mg total) by mouth 2 (two) times daily. (Patient taking differently: Take 500 mg by mouth 2 (two) times daily. )   No facility-administered encounter medications on file as of 03/29/2018.     Allergies  Allergen Reactions  . Dilaudid [Hydromorphone Hcl] Other (See Comments)    hallucinations  . Fentanyl Other (See Comments)    hallucinations    Immunization History  Administered Date(s) Administered  . Influenza Split 01/28/2011, 01/18/2012, 02/25/2015  . Influenza Whole 01/07/2010  . Influenza,inj,Quad PF,6+ Mos 01/19/2013, 02/27/2014, 06/08/2016, 12/08/2016  .  Pneumococcal Conjugate-13 03/28/2011    Current Medications, Allergies, Past Medical History, Past Surgical History, Family History, and Social History were reviewed in [employer] record.    Review of Systems        See HPI - all other systems neg except as noted...  The patient complains of cyst on back & dyspnea on exertion.  The patient denies anorexia, fever, weight loss, weight gain, vision loss, decreased  hearing, hoarseness, chest pain, syncope, peripheral edema, prolonged cough, headaches, hemoptysis, abdominal pain, melena, hematochezia, severe indigestion/heartburn, hematuria, incontinence, muscle weakness, suspicious skin lesions, transient blindness, difficulty walking, depression, unusual weight change, abnormal bleeding, enlarged lymph nodes, and angioedema.     Objective:   Physical Exam     WD, WN, 77 y/o WM in NAD...  GENERAL:  Alert & oriented; pleasant & cooperative... HEENT:  [place]/AT, EOM-wnl, PERRLA, EACs-clear, TMs-wnl, NOSE-clear, THROAT-clear & wnl. NECK:  Supple w/ fairROM; no JVD; s/p left CAE, +bruits; no thyromegaly or nodules palpated; no lymphadenopathy. CHEST:  Clear to P & A; without wheezes/ rales/ or rhonchi heard... HEART:  Regular Rhythm; gr 1/6 SEM without rubs or gallops detected... ABDOMEN:  Soft & nontender; normal bowel sounds; no organomegaly or masses palpated... EXT: right knee deformity s/p ORIF, mod arthritic changes; no varicose veins/ +venous insuffic/ 1+ edema. NEURO:  CN's intact; no focal neuro deficits... DERM:  Quarter sized sebaceous cyst in mid back.   RADIOLOGY DATA:  Reviewed in the EPIC EMR & discussed w/ the patient...  LABORATORY DATA:  Reviewed in the EPIC EMR & discussed w/ the patient...   Assessment & Plan:    BP>  Neuro has wanted his BP to be sl higher than usual for perfusion reasons but BP= 170/80 on arrival & improved to 150/80 w/ rest; we opted to start Losartan 78m/d for BP & vasc  dis... 5/16> BP= 144/76 today, continue Losar50... 9/16> BP= 120/60 & he remains asymptomatic... ~  Subsequent ROVs w/ stable BP on Losartan50 + diet, wt reduction...  CEREBROVASC Disease & STROKES 7/17, 11/15 & TIA 12/12>  Known severe cerebrovasc dis & he had TIA while in hosp for fx leg 2012 (he was off plavix at the time); Recurrent prob 11/15 w/ watershed infarcts in left frontal/ temporal/ parietal regions while he was off Plavix (he was taking meds on his own & hadn't been filling the Plavix); s/p extensive eval w/ MRI/MRA, CT Angio & Arteriogram by DLadene Artistw/ attempt at intervention on left unsuccessful; wife now supervises all meds... He has prob multi-infarct dementia & wife requests Neuro 2nd opinion => [facility]   JGenevahas had a relatively quiet/ stable interval & is doing satis on his current regimen;  On ASA/ Plavix & Losar50 w/ adeq BP control x "white coat" (reminded no salt);  DM treated w/ Lantus 15u + 3 meds & labs are reasonably well maintained;  Continue same meds, PLEASE incr phys activity & mental exercises, rov [id number]/27/18>    JaKayeemains stable on current regimen- BP is ok, DM cotrol is good;  Major prob is his atherosclerotic dis & cerebrovasc changes w/ mult occlusions, strokes, etc-- follwed by Neuro-DrAquino, and IR-DrDeveshwar... Rec to continue same meds and follow up visits 10/07/17>   He clearly cannot be trusted w/ guns & wife is advised to keep all under lock & key where he does not have access;  He appears stable on current meds and his known severe cerebrovasc dis and mult medical problems... we discussed my up-coming retirement at the end of 2019 & the need to obtain another PCP to pick-up on all is issues-- HBP, ceerebrovasc dis, HL, DM, vasc dementia, GU issues and anemia... we will plan recheck in 95m695mo be sure he is stable.   NEURO> Mild Cognitive Impairment per DrSethi & wife is concerned re: memory/ vascular dementia; we will  arrange for Neuro 2nd opinion=> started on Aricept & stable...Marland KitchenMarland Kitchen  CHOL>  on Atrova80 now & f/u FLP much improved...  DM>  On Lantus15, Metform, Diabeta, Onglyza & compliance is better w/ wife supervision; Labs stable w/ A1c in the 6's...  Overweight>  We discussed low carb, low fat wt reducing diet;  Nice job w/ wt down to 173# 5/17 OV...  GI>  GERD prev controlled w/ Protonix but he stopped on his own (?why?); EGD 11/13 w/ dilatation & reminded to take the Protonix40 every day...  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  DJD/ Hx Gout/ LBP>  He was seeing chiropractor regularly regarding LBP & then f/u w/ DrElsner,Neurosurg & had ESI by DrHarkins & the shots have helped...  s/p fall w/ right Tibial plateaux fx> 1st ext fixation, 2nd ORIF>  By DrHardy, Ortho; exercise is difficult but very much needed; he's had extensive PT training...  Anxiety>  He remains on Alpraz Prn... Also has [place] on his list of meds...  B12 deficiency>  Labs 5/16 w/ B12 level = 196; rec to start oral B12 supplement ~1039mg/d & repeat B12 level 11/16= 426   Patient's Medications  New Prescriptions   No medications on file  Previous Medications   ASPIRIN EC 81 MG EC TABLET    Take 1 tablet (81 mg total) by mouth daily.   ATORVASTATIN (LIPITOR) 80 MG TABLET    TAKE 1 TABLET BY MOUTH DAILY AT 6PM   CHOLECALCIFEROL (VITAMIN D) 1000 UNITS TABLET    Take 2,000 Units by mouth daily.   CLOPIDOGREL (PLAVIX) 75 MG TABLET    TAKE 1 TABLET BY MOUTH EVERY DAY   DONEPEZIL (ARICEPT) 10 MG TABLET    Take 1 tablet (10 mg total) by mouth daily.   GLIPIZIDE (GLUCOTROL XL) 5 MG 24 HR TABLET    TAKE 1 TABLET (5 MG TOTAL) BY MOUTH DAILY WITH BREAKFAST.   INSULIN GLARGINE (LANTUS SOLOSTAR [place])    Inject 20 Units into the skin at bedtime.   INSULIN PEN NEEDLE (B-D ULTRAFINE III SHORT PEN) 31G X 8 MM MISC    Use with the lantus solostar pen as directed   METFORMIN  (GLUCOPHAGE) 500 MG TABLET    Take 500 mg by mouth 2 (two) times daily with a meal.   ONE TOUCH ULTRA TEST TEST STRIP    USE AS INSTRUCTED   PANTOPRAZOLE (PROTONIX) 40 MG TABLET    TAKE 1 TABLET (40 MG TOTAL) BY MOUTH DAILY.   SAXAGLIPTIN HCL (ONGLYZA) 5 MG TABS TABLET    Take 1 tablet (5 mg total) by mouth daily.   SERTRALINE (ZOLOFT) 50 MG TABLET    TAKE 1 TABLET BY MOUTH EVERYDAY AT BEDTIME  Modified Medications   Modified Medication Previous Medication   ALPRAZOLAM (XANAX) 0.5 MG TABLET ALPRAZolam (XANAX) 0.5 MG tablet      1/2-1 tab BID as needed for anxiety    Take 0.5 mg by mouth 2 (two) times daily as needed for anxiety. 1/2-1 tab BID as needed for anxiety  Discontinued Medications   ALPRAZOLAM (XANAX) 0.5 MG TABLET    TAKE 1/2 TO 1 TABLET BY MOUTH 3 TIMES DAILY as needed   DOXYCYCLINE (VIBRA-TABS) 100 MG TABLET    Take 1 tablet (100 mg total) by mouth 2 (two) times daily.   LANTUS SOLOSTAR 100 UNIT/ML SOLOSTAR PEN    INJECT 10 UNITS INTO THE SKIN AT BEDTIME.   METFORMIN (GLUCOPHAGE) 500 MG TABLET  Take 2 tablets (1,000 mg total) by mouth 2 (two) times daily.

## 2018-04-14 ENCOUNTER — Encounter: Payer: Self-pay | Admitting: Internal Medicine

## 2018-05-04 ENCOUNTER — Other Ambulatory Visit: Payer: Self-pay | Admitting: Pulmonary Disease

## 2018-05-20 ENCOUNTER — Ambulatory Visit: Payer: Medicare Other | Admitting: Neurology

## 2018-05-23 ENCOUNTER — Telehealth: Payer: Self-pay | Admitting: Internal Medicine

## 2018-05-23 NOTE — Telephone Encounter (Signed)
MEDICATION: Lantus Solostar  PHARMACY:  [facility] [place], [address]  IS THIS A 90 DAY SUPPLY : unkonwn  IS PATIENT OUT OF MEDICATION: no  IF NOT; HOW MUCH IS LEFT: 1 days left  LAST APPOINTMENT DATE: @Visit  date not found  NEXT APPOINTMENT DATE:@2 /18/2020  DO WE HAVE YOUR PERMISSION TO LEAVE A DETAILED MESSAGE:  OTHER COMMENTS:  Per patients wife Dr Lenna Gilford will not refill Lantus RX since patient has a new patient appointment with Dr Cruzita Lederer.   **Let patient know to contact pharmacy at the end of the day to make sure medication is ready. **  ** Please notify patient to allow 48-72 hours to process**  **Encourage patient to contact the pharmacy for refills or they can request refills through [facility]**

## 2018-05-23 NOTE — Telephone Encounter (Signed)
error 

## 2018-05-25 ENCOUNTER — Telehealth: Payer: Self-pay | Admitting: Pulmonary Disease

## 2018-05-25 MED ORDER — INSULIN GLARGINE 100 UNIT/ML SOLOSTAR PEN: 20.0000 [IU] | PEN_INJECTOR | Freq: Every day | SUBCUTANEOUS | 0 refills | Status: DC

## 2018-05-25 NOTE — Telephone Encounter (Signed)
Called and spoke with patients wife, she stated that the patient is needing a refill of his Lantus as he is out. Patients wife stated that she spoke with someone here yesterday who stated that we would give the patient a "break" prescription to get him through until he sees the endocrinologist on 05/31/18. Per Dr. Brendolyn Patty last AVS patient was to obtain a new PCP and per patients wife they have not done so. They are requesting [place] to fill a prescription of his Lantus until he is seen by the endocrinologist. Eustaquio Maize please advise, thank you.

## 2018-05-25 NOTE — Addendum Note (Signed)
Addended by: Madolyn Frieze on: 05/25/2018 02:11 PM   Modules accepted: Orders

## 2018-05-25 NOTE — Telephone Encounter (Signed)
Refill sent nothing further  Needed at this time.

## 2018-05-25 NOTE — Telephone Encounter (Signed)
That is perfectly reasonable. Ok with filling, make sure patient follows up with endocrine

## 2018-05-31 ENCOUNTER — Other Ambulatory Visit: Payer: Self-pay

## 2018-05-31 ENCOUNTER — Ambulatory Visit (INDEPENDENT_AMBULATORY_CARE_PROVIDER_SITE_OTHER): Payer: Medicare Other | Admitting: Internal Medicine

## 2018-05-31 ENCOUNTER — Encounter: Payer: Self-pay | Admitting: Internal Medicine

## 2018-05-31 ENCOUNTER — Encounter: Payer: Self-pay | Admitting: Neurology

## 2018-05-31 ENCOUNTER — Ambulatory Visit: Payer: Medicare Other | Admitting: Neurology

## 2018-05-31 VITALS — BP 122/70 | HR 61 | Ht 66.0 in | Wt 168.0 lb

## 2018-05-31 VITALS — BP 136/64 | HR 74 | Ht 66.0 in | Wt 169.0 lb

## 2018-05-31 DIAGNOSIS — E1165 Type 2 diabetes mellitus with hyperglycemia: Secondary | ICD-10-CM

## 2018-05-31 DIAGNOSIS — F03B0 Unspecified dementia, moderate, without behavioral disturbance, psychotic disturbance, mood disturbance, and anxiety: Secondary | ICD-10-CM

## 2018-05-31 DIAGNOSIS — E1159 Type 2 diabetes mellitus with other circulatory complications: Secondary | ICD-10-CM | POA: Diagnosis not present

## 2018-05-31 DIAGNOSIS — F0390 Unspecified dementia without behavioral disturbance: Secondary | ICD-10-CM | POA: Diagnosis not present

## 2018-05-31 LAB — POCT GLYCOSYLATED HEMOGLOBIN (HGB A1C): Hemoglobin A1C: 7.3 % — AB (ref 4.0–5.6)

## 2018-05-31 MED ORDER — MEMANTINE HCL 10 MG PO TABS: ORAL_TABLET | ORAL | 11 refills | Status: DC

## 2018-05-31 MED ORDER — METFORMIN HCL 500 MG PO TABS: 500.0000 mg | ORAL_TABLET | Freq: Two times a day (BID) | ORAL | 3 refills | Status: DC

## 2018-05-31 MED ORDER — DONEPEZIL HCL 10 MG PO TABS: 10.0000 mg | ORAL_TABLET | Freq: Every day | ORAL | 3 refills | Status: AC

## 2018-05-31 MED ORDER — GLIPIZIDE ER 5 MG PO TB24: 5.0000 mg | ORAL_TABLET | Freq: Every day | ORAL | 3 refills | Status: AC

## 2018-05-31 MED ORDER — INSULIN PEN NEEDLE 32G X 4 MM MISC: 3 refills | Status: AC

## 2018-05-31 MED ORDER — INSULIN GLARGINE 100 UNIT/ML SOLOSTAR PEN: 20.0000 [IU] | PEN_INJECTOR | Freq: Every day | SUBCUTANEOUS | 3 refills | Status: AC

## 2018-05-31 NOTE — Addendum Note (Signed)
Addended by: Cardell Peach I on: 05/31/2018 03:56 PM   Modules accepted: Orders

## 2018-05-31 NOTE — Progress Notes (Signed)
Patient ID: Peter Nicholson, male   DOB: [birth year], 78 y.o.   MRN: [id number]   HPI: Peter Nicholson is a 78 y.o.-year-old male, referred by his PCP, Dr. Lenna Gilford, for management of DM2, dx in ~2010, insulin-dependent since ~2015, uncontrolled, with long-term complications (cerebrovascular disease - s/p TIA and stroke, chronic left ICA occlusion; CKD stage III; venous insufficiency). He is here with his wife, who offers most of the hx as the pt has dementia. New PCP: Dr. Heide Scales, [place]  Reviewed latest HbA1c level: Lab Results  Component Value Date   HGBA1C 7.5 (H) 03/23/2018   HGBA1C 6.4 12/07/2016   HGBA1C 6.7 (H) 02/25/2015   HGBA1C 6.8 (H) 08/23/2014   HGBA1C 7.6 (H) 05/25/2014   HGBA1C 9.3 (H) 04/09/2014   HGBA1C 9.0 (H) 02/26/2014   HGBA1C 7.3 (H) 11/21/2013   HGBA1C 7.3 (H) 05/24/2013   HGBA1C 8.3 (H) 02/23/2013   Of note, he has anemia  - last Hb 9.7.  Pt is on a regimen of: - Metformin 1000 >> 500 mg 2x a day, with meals - Glipizide XL 5 mg before b'fast - Lantus 15 >> 20 units at bedtime He stopped Onglyza >> stopped 6 mo ago b/c $$$.  Pt checks his sugars 1x a day: - am: n/c - 2h after b'fast: n/c - before lunch: n/c - 2h after lunch: n/c - before dinner: n/c - 2h after dinner: n/c - bedtime: 150-240 - nighttime: n/c Lowest sugar was 100s.; ? hypoglycemia awareness. Highest sugar was 300s.  Glucometer: One Touch  Pt's meals are: - Brunch: sausage patty + scrambled egg; ham, egg and cheese toast; OJ, sweet tea, milk - Dinner: meat + veggies + starch - Snacks: snacks throughout the day: cookies, fruit Drinks a lot of sweet tea.  - + CKD stage III, last BUN/creatinine:  03/23/2018: GFR 47.33 Lab Results  Component Value Date   BUN 36 (H) 03/23/2018   BUN 24 (H) 06/27/2017   CREATININE 1.52 (H) 03/23/2018   CREATININE 1.54 (H) 06/27/2017  Not on ACE inhibitor/ARB.  -+ HL; last set of lipids: Lab Results  Component Value Date   CHOL 111 03/23/2018    HDL 52.60 03/23/2018   LDLCALC 45 03/23/2018   LDLDIRECT 149.1 01/15/2010   TRIG 66.0 03/23/2018   CHOLHDL 2 03/23/2018  On Lipitor 80.  - last eye exam was in 11/2016. No DR.   - no numbness and tingling in his feet.  Pt has no FH of DM.  He has a h/o kidney cancer >> R nephrectomy.  ROS: Constitutional: no weight gain, no weight loss, no fatigue, no subjective hyperthermia, no subjective hypothermia, no nocturia Eyes: no blurry vision, no xerophthalmia ENT: no sore throat, no nodules palpated in neck, no dysphagia, no odynophagia, no hoarseness, no tinnitus, + hypoacusis Cardiovascular: no CP, no SOB, no palpitations, no leg swelling Respiratory: no cough, no SOB, no wheezing Gastrointestinal: no N, no V, no D, no C, + acid reflux Musculoskeletal: no muscle, no joint aches Skin: no rash, no hair loss Neurological: no tremors, no numbness or tingling/no dizziness/no HAs Psychiatric: no depression, + anxiety  Past Medical History:  Diagnosis Date  . Anxiety state, unspecified   . Blood transfusion without reported diagnosis   . Bowel obstruction ([place])   . Calculus of kidney   . Cerebrovascular disease, unspecified   . Diaphragmatic hernia without mention of obstruction or gangrene   . Diverticulosis of colon (without mention of hemorrhage)   .  Esophageal reflux   . Gout, unspecified   . History of gallstones   . History of seizures   . HTN (hypertension)   . IBS (irritable bowel syndrome)   . Kidney carcinoma ([place])   . Lumbago   . Obstructive sleep apnea (adult) (pediatric)    doesnt wear CPAP  . Osteoarthrosis, unspecified whether generalized or localized, unspecified site    tendonitis  . Other specified congenital anomaly of kidney   . Overweight(278.02)   . Pure hypercholesterolemia   . Seizures ([place])    last seizure November 2012  . Stroke ([place])    2001  . Type II or unspecified type diabetes mellitus without mention of complication, not stated as  uncontrolled   . Unspecified venous (peripheral) insufficiency    Past Surgical History:  Procedure Laterality Date  . CAROTID ENDARTERECTOMY  09/2000   Dr.Lawson  . CHOLECYSTECTOMY  9/04   Dr. Rebekah Chesterfield  . EXTERNAL FIXATION LEG  03/10/2011   Procedure: EXTERNAL FIXATION LEG;  Surgeon: Rozanna Box;  Location: [facility];  Service: Orthopedics;  Laterality: Right;  . INCISIONAL HERNIA REPAIR  1/96   Dr.Leone  . NASAL SINUS SURGERY  12/96   Dr.Redman  . 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 ORIF TIBIA PLATEAU  03/24/2011   Procedure: OPEN REDUCTION INTERNAL FIXATION (ORIF) TIBIAL PLATEAU;  Surgeon: Rozanna Box;  Location: [facility];  Service: Orthopedics;  Laterality: Right;  . RADIOLOGY WITH ANESTHESIA N/A 04/12/2014   Procedure: ANGIOPLASTY;  Surgeon: Rob Hickman, MD;  Location: [facility];  Service: Radiology;  Laterality: N/A;  . SMALL INTESTINE SURGERY    . veterbral art angioplasty     x2   Social History   Socioeconomic History  . Marital status: Married    Spouse name: Charlett Nose  . Number of children: 2  . Years of education: Not on file  . Highest education level: Not on file  Occupational History  . 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 Financial resource strain: Not on file  . Food insecurity:    Worry: Not on file    Inability: Not on file  . Transportation needs:    Medical: Not on file    Non-medical: Not on file  Tobacco Use  . Smoking status: Never Smoker  . Smokeless tobacco: Never Used  Substance and Sexual Activity  . Alcohol use: No    Alcohol/week: 0.0 standard drinks  . Drug use: No  . Sexual activity: Never  Lifestyle  . Physical activity:    Days per week: Not on file    Minutes per session: Not on file  . Stress: Not on file  Relationships  . Social connections:    Talks on phone: Not on file    Gets together: Not on file    Attends religious service: Not on file    Active member of  club or organization: Not on file    Attends meetings of clubs or organizations: Not on file    Relationship status: Not on file  . Intimate partner violence:    Fear of current or ex partner: Not on file    Emotionally abused: Not on file    Physically abused: Not on file    Forced sexual activity: Not on file  Other Topics Concern  . Not on file  Social History Narrative   Patient drinks 4 cups of a caffinated Drinks  week.   Current Outpatient Medications on File Prior to Visit  Medication Sig Dispense Refill  . ALPRAZolam (XANAX) 0.5 MG tablet 1/2-1 tab BID as needed for anxiety 60 tablet 5  . aspirin EC 81 MG EC tablet Take 1 tablet (81 mg total) by mouth daily. 30 tablet 0  . atorvastatin (LIPITOR) 80 MG tablet TAKE 1 TABLET BY MOUTH DAILY AT 6PM 90 tablet 3  . cholecalciferol (VITAMIN D) 1000 units tablet Take 2,000 Units by mouth daily.    . clopidogrel (PLAVIX) 75 MG tablet TAKE 1 TABLET BY MOUTH EVERY DAY 90 tablet 1  . donepezil (ARICEPT) 10 MG tablet Take 1 tablet (10 mg total) by mouth daily. 90 tablet 3  . glipiZIDE (GLUCOTROL XL) 5 MG 24 hr tablet TAKE 1 TABLET (5 MG TOTAL) BY MOUTH DAILY WITH BREAKFAST. 90 tablet 1  . Insulin Glargine (LANTUS SOLOSTAR) 100 UNIT/ML Solostar Pen Inject 20 Units into the skin at bedtime. 15 mL 0  . Insulin Pen Needle (B-D ULTRAFINE III SHORT PEN) 31G X 8 MM MISC Use with the lantus solostar pen as directed 50 each 0  . metFORMIN (GLUCOPHAGE) 500 MG tablet Take 500 mg by mouth 2 (two) times daily with a meal.    . ONE TOUCH ULTRA TEST test strip USE AS INSTRUCTED 100 each 2  . pantoprazole (PROTONIX) 40 MG tablet TAKE 1 TABLET (40 MG TOTAL) BY MOUTH DAILY. 90 tablet 0  . sertraline (ZOLOFT) 50 MG tablet TAKE 1 TABLET BY MOUTH EVERYDAY AT BEDTIME 90 tablet 2  . saxagliptin HCl (ONGLYZA) 5 MG TABS tablet Take 1 tablet (5 mg total) by mouth daily. (Patient not taking: Reported on 05/31/2018) 15 tablet 0   No current facility-administered  medications on file prior to visit.    Allergies  Allergen Reactions  . Dilaudid [Hydromorphone Hcl] Other (See Comments)    hallucinations  . Fentanyl Other (See Comments)    hallucinations   Family History  Problem Relation Age of Onset  . Heart attack Father   . Dementia Mother   . Ovarian cancer Paternal Grandmother   . Breast cancer Sister   . Colon cancer Neg Hx   . Esophageal cancer Neg Hx   . Rectal cancer Neg Hx   . Stomach cancer Neg Hx     PE: BP 122/70   Pulse 61   Ht 5\' 6"  (1.676 m)   Wt 168 lb (76.2 kg)   SpO2 99%   BMI 27.12 kg/m  Wt Readings from Last 3 Encounters:  05/31/18 168 lb (76.2 kg)  03/29/18 165 lb 12.8 oz (75.2 kg)  11/23/17 163 lb 6.4 oz (74.1 kg)   Constitutional: Normal weight, in NAD Eyes: PERRLA, EOMI, no exophthalmos ENT: moist mucous membranes, no thyromegaly, no cervical lymphadenopathy Cardiovascular: RRR, No MRG Respiratory: CTA B Gastrointestinal: abdomen soft, NT, ND, BS+ Musculoskeletal: no deformities, strength intact in all 4 Skin: moist, warm, no rashes Neurological: no tremor with outstretched hands, DTR normal in all 4  ASSESSMENT: 1. DM2, insulin-dependent, uncontrolled, with long-term complications - cerebrovascular disease, chronic left ICA occlusion - s/p TIA and stroke, also with vascular dementia - CKD stage III - venous insufficiency  PLAN:  1. Patient with long-standing, uncontrolled diabetes, on oral antidiabetic regimen + basal insulin, which became insufficient.  Latest HbA1c obtained 2 months ago was higher, at 7.5%. Today, 7.3%, better. -Patient checks his sugars usually after dinner/at bedtime and these are usually higher than goal.  He is snacking  after dinner, but it is not clear how often this happens.  However, wife reports that patient is drinking orange juice, milk, or sweet tea in the morning and continues to drink sweet tea throughout the day.  We discussed that no medicine can cover the  persistently increased blood sugars from sleeping sweet tea.  I strongly advised him to stop this along with orange juice and milk.  I offered a referral to nutrition, but they would like to think about it. - I think this changes would be now in getting his diabetes under control so we can continue his current regimen for now.  He is on the low dose basal insulin and also metformin and glipizide.  The metformin dose was decreased 3 to 4 months ago due to his declining kidney function.  We have to be mindful about this regarding future treatment decisions. -I also strongly advised him to check sugars at different times of the day and write them down in the log- check 1-2x a day, rotating checks - I suggested to:  Patient Instructions  Please continue: - Metformin 500 mg 2x a day, with meals - Glipizide XL 5 mg before b'fast - Lantus 20 units at bedtime  Please stop OJ, sweet tea and milk.  Please let me know if the sugars are consistently <80 or >200.  Please return in 3 months with your sugar log.   - discussed about CBG targets for treatment: 80-130 mg/dL before meals and <180 mg/dL after meals; target HbA1c <7%. - given sugar log and advised how to fill it and to bring it at next appt  - given foot care handout and explained the principles  - given instructions for hypoglycemia management "15-15 rule"  - advised for yearly eye exams  - I refilled all of his diabetes prescriptions for now. - Return to clinic in 3 mo with sugar log   Philemon Kingdom, MD PhD [facility] Endocrinology

## 2018-05-31 NOTE — Patient Instructions (Signed)
1. Start Memantine (Namenda) 10mg: take 1 tablet every night for 1 week, then increase to 1 tablet twice a day  2. Continue Donepezil 10mg daily  3. Follow-up in 6 months, call for any changes   FALL PRECAUTIONS: Be cautious when walking. Scan the area for obstacles that may increase the risk of trips and falls. When getting up in the mornings, sit up at the edge of the bed for a few minutes before getting out of bed. Consider elevating the bed at the head end to avoid drop of blood pressure when getting up. Walk always in a well-lit room (use night lights in the walls). Avoid area rugs or power cords from appliances in the middle of the walkways. Use a walker or a cane if necessary and consider physical therapy for balance exercise. Get your eyesight checked regularly.  HOME SAFETY: Consider the safety of the kitchen when operating appliances like stoves, microwave oven, and blender. Consider having supervision and share cooking responsibilities until no longer able to participate in those. Accidents with firearms and other hazards in the house should be identified and addressed as well.  ABILITY TO BE LEFT ALONE: If patient is unable to contact 911 operator, consider using LifeLine, or when the need is there, arrange for someone to stay with patients. Smoking is a fire hazard, consider supervision or cessation. Risk of wandering should be assessed by caregiver and if detected at any point, supervision and safe proof recommendations should be instituted.   RECOMMENDATIONS FOR ALL PATIENTS WITH MEMORY PROBLEMS: 1. Continue to exercise (Recommend 30 minutes of walking everyday, or 3 hours every week) 2. Increase social interactions - continue going to Church and enjoy social gatherings with friends and family 3. Eat healthy, avoid fried foods and eat more fruits and vegetables 4. Maintain adequate blood pressure, blood sugar, and blood cholesterol level. Reducing the risk of stroke and  cardiovascular disease also helps promoting better memory. 5. Avoid stressful situations. Live a simple life and avoid aggravations. Organize your time and prepare for the next day in anticipation. 6. Sleep well, avoid any interruptions of sleep and avoid any distractions in the bedroom that may interfere with adequate sleep quality 7. Avoid sugar, avoid sweets as there is a strong link between excessive sugar intake, diabetes, and cognitive impairment The Mediterranean diet has been shown to help patients reduce the risk of progressive memory disorders and reduces cardiovascular risk. This includes eating fish, eat fruits and green leafy vegetables, nuts like almonds and hazelnuts, walnuts, and also use olive oil. Avoid fast foods and fried foods as much as possible. Avoid sweets and sugar as sugar use has been linked to worsening of memory function.  There is always a concern of gradual progression of memory problems. If this is the case, then we may need to adjust level of care according to patient needs. Support, both to the patient and caregiver, should then be put into place.  

## 2018-05-31 NOTE — Patient Instructions (Signed)
Please continue: - Metformin 500 mg 2x a day, with meals - Glipizide XL 5 mg before b'fast - Lantus 20 units at bedtime  Please stop OJ, sweet tea and milk.  Please let me know if the sugars are consistently <80 or >200.  Please return in 3 months with your sugar log.   PATIENT INSTRUCTIONS FOR TYPE 2 DIABETES:  **Please join MyChart!** - see attached instructions about how to join if you have not done so already.  DIET AND EXERCISE Diet and exercise is an important part of diabetic treatment.  We recommended aerobic exercise in the form of brisk walking (working between 40-60% of maximal aerobic capacity, similar to brisk walking) for 150 minutes per week (such as 30 minutes five days per week) along with 3 times per week performing 'resistance' training (using various gauge rubber tubes with handles) 5-10 exercises involving the major muscle groups (upper body, lower body and core) performing 10-15 repetitions (or near fatigue) each exercise. Start at half the above goal but build slowly to reach the above goals. If limited by weight, joint pain, or disability, we recommend daily walking in a swimming pool with water up to waist to reduce pressure from joints while allow for adequate exercise.    BLOOD GLUCOSES Monitoring your blood glucoses is important for continued management of your diabetes. Please check your blood glucoses 2-4 times a day: fasting, before meals and at bedtime (you can rotate these measurements - e.g. one day check before the 3 meals, the next day check before 2 of the meals and before bedtime, etc.).   HYPOGLYCEMIA (low blood sugar) Hypoglycemia is usually a reaction to not eating, exercising, or taking too much insulin/ other diabetes drugs.  Symptoms include tremors, sweating, hunger, confusion, headache, etc. Treat IMMEDIATELY with 15 grams of Carbs: . 4 glucose tablets .  cup regular juice/soda . 2 tablespoons raisins . 4 teaspoons sugar . 1 tablespoon  honey Recheck blood glucose in 15 mins and repeat above if still symptomatic/blood glucose <100.  RECOMMENDATIONS TO REDUCE YOUR RISK OF DIABETIC COMPLICATIONS: * Take your prescribed MEDICATION(S) * Follow a DIABETIC diet: Complex carbs, fiber rich foods, (monounsaturated and polyunsaturated) fats * AVOID saturated/trans fats, high fat foods, >2,300 mg salt per day. * EXERCISE at least 5 times a week for 30 minutes or preferably daily.  * DO NOT SMOKE OR DRINK more than 1 drink a day. * Check your FEET every day. Do not wear tightfitting shoes. Contact us if you develop an ulcer * See your EYE doctor once a year or more if needed * Get a FLU shot once a year * Get a PNEUMONIA vaccine once before and once after age 28 years  GOALS:  * Your Hemoglobin A1c of <7%  * fasting sugars need to be <130 * after meals sugars need to be <180 (2h after you start eating) * Your Systolic BP should be 149 or lower  * Your Diastolic BP should be 80 or lower  * Your HDL (Good Cholesterol) should be 40 or higher  * Your LDL (Bad Cholesterol) should be 100 or lower. * Your Triglycerides should be 150 or lower  * Your Urine microalbumin (kidney function) should be <30 * Your Body Mass Index should be 25 or lower    Please consider the following ways to cut down carbs and fat and increase fiber and micronutrients in your diet: - substitute whole grain for white bread or pasta - substitute brown rice for  white rice - substitute 90-calorie flat bread pieces for slices of bread when possible - substitute sweet potatoes or yams for white potatoes - substitute humus for margarine - substitute tofu for cheese when possible - substitute almond or rice milk for regular milk (would not drink soy milk daily due to concern for soy estrogen influence on breast cancer risk) - substitute dark chocolate for other sweets when possible - substitute water - can add lemon or orange slices for taste - for diet sodas  (artificial sweeteners will trick your body that you can eat sweets without getting calories and will lead you to overeating and weight gain in the long run) - do not skip breakfast or other meals (this will slow down the metabolism and will result in more weight gain over time)  - can try smoothies made from fruit and almond/rice milk in am instead of regular breakfast - can also try old-fashioned (not instant) oatmeal made with almond/rice milk in am - order the dressing on the side when eating salad at a restaurant (pour less than half of the dressing on the salad) - eat as little meat as possible - can try juicing, but should not forget that juicing will get rid of the fiber, so would alternate with eating raw veg./fruits or drinking smoothies - use as little oil as possible, even when using olive oil - can dress a salad with a mix of balsamic vinegar and lemon juice, for e.g. - use agave nectar, stevia sugar, or regular sugar rather than artificial sweateners - steam or broil/roast veggies  - snack on veggies/fruit/nuts (unsalted, preferably) when possible, rather than processed foods - reduce or eliminate aspartame in diet (it is in diet sodas, chewing gum, etc) Read the labels!  Try to read Dr. Janene Harvey book: "Program for Reversing Diabetes" for other ideas for healthy eating.

## 2018-05-31 NOTE — Progress Notes (Signed)
NEUROLOGY FOLLOW UP OFFICE NOTE  Peter Nicholson [id number]  DOB: [birth year]  HISTORY OF PRESENT ILLNESS: I had the pleasure of seeing Peter Nicholson in follow-up in the neurology clinic on 05/31/2018.  He is again accompanied by his wife and daughter who help supplement the history today. The patient was last seen a year ago for mild dementia, likely vascular. MMSE 22/30 in February 2019 (22/30 in January 2018, 24/30 in April 2017). He is taking Aricept 10mg  daily without side effects. Since his last visit, he has stopped driving after he got lost almost a year ago and ended up almost 2 hours away in [place], [place]. He has difficulty following instructions on how to use his phone or putting dirty clothes in the laundry. He repeatedly keeps turning the lamp on and off, with difficulty understanding why he should not do this. Wife manages finances and medications. His wife denies any paranoia or hallucinations. He is independent with dressing and bathing. He was admitted to the hospital in March 2019 for syncope, his wife found him against the wall with a blank look, he slid down and was unresponsive. Head CT no acute changes, there were unchanged remote infarcts, atrophy, and chronic small vessel ischemia. EEG showed very mild diffuse background slowing. Echo showed EF of 60-65%, left atrium mildly dilated. Episode felt due to orthostatic hypotension. No further passing out since then. He denies any headaches, vision changes, focal numbness/tingling/weakness, bowel/bladder dysfunction, no falls. His wife appears more tired, their daughter reports she is more depressed.   HPI: This is a pleasant 78 yo RH man with vascular risk factors including hypertension, diabetes, hyperlipidemia, peripheral vascular disease, TIA in 2012, and left MCA watershed infarcts secondary to left ICA stenosis, presenting for evaluation of worsening memory. He had previously seen neurologist Dr. Leonie Man in 2014 with his wife  reporting mild memory difficulties since around 2012. He had mostly short-term memory difficulties, needing to be told several times and not remembering. His MMSE at that time was 27/30. His wife reports that after his strokes in November and December 2015, memory has been much worse. Hospital records were reviewed, in November he presented with aphasia, MRI showed left frontal and parietal watershed infarcts. His carotid angiogram showed severe preocclusive stenosis of the left proximal ICA, 80-85% stenosis of the right ICA proximal cavernous segment, 80% stenosis of the dominant left vertebral basilar junction just proximal to the basilar artery. He was discharged on aspirin and Plavix. He returned to the ER a month later with confusion, anomia, and ataxia. MRI showed multiple ischemic infarcts in the left frontal and parietal lobes, as well as the posterior left temporal lobe at the temporoparietal junction, cerebral angiogram showed interval progression of stenosis of left ICA distal to origin of ophthalmic artery with severe near complete occlusion of proximal cavernous left ICA, 70% stenosis of proximal cavernous right ICA, and 60% stenosis of dominant left vertebral artery at origin. There was unsuccessful attempt at revascularization of occluded left ICA cavernous segment, and note of multiple collaterals in the left ICA cavernous occluded segment. He was discharged home with SBP goal between 130 to 160 and control of vascular risk factors.   He feels his memory is "average, there are times I cannot remember." His wife reports he is not remembering things he used to know to do, such as how to fix things around the house and which are the right tools to use. He has had word-finding difficulties since the stroke, his wife  notices this once in a while. He forgets to take his medications, his wife now sets this out for him. He denies getting lost driving, his wife is concerned "somewhat," he occasionally  cannot recall the way to go somewhere. He occasionally gets mad and frustrated, but no paranoia. He denies any alcohol or tobacco use, no significant head injuries. His mother had memory changes in her 12s.   PAST MEDICAL HISTORY: Past Medical History:  Diagnosis Date  . Anxiety state, unspecified   . Blood transfusion without reported diagnosis   . Bowel obstruction ([place])   . Calculus of kidney   . Cerebrovascular disease, unspecified   . Diaphragmatic hernia without mention of obstruction or gangrene   . Diverticulosis of colon (without mention of hemorrhage)   . Esophageal reflux   . Gout, unspecified   . History of gallstones   . History of seizures   . HTN (hypertension)   . IBS (irritable bowel syndrome)   . Kidney carcinoma ([place])   . Lumbago   . Obstructive sleep apnea (adult) (pediatric)    doesnt wear CPAP  . Osteoarthrosis, unspecified whether generalized or localized, unspecified site    tendonitis  . Other specified congenital anomaly of kidney   . Overweight(278.02)   . Pure hypercholesterolemia   . Seizures ([place])    last seizure November 2012  . Stroke ([place])    2001  . Type II or unspecified type diabetes mellitus without mention of complication, not stated as uncontrolled   . Unspecified venous (peripheral) insufficiency     MEDICATIONS: Current Outpatient Medications on File Prior to Visit  Medication Sig Dispense Refill  . ALPRAZolam (XANAX) 0.5 MG tablet 1/2-1 tab BID as needed for anxiety 60 tablet 5  . aspirin EC 81 MG EC tablet Take 1 tablet (81 mg total) by mouth daily. 30 tablet 0  . atorvastatin (LIPITOR) 80 MG tablet TAKE 1 TABLET BY MOUTH DAILY AT 6PM 90 tablet 3  . cholecalciferol (VITAMIN D) 1000 units tablet Take 2,000 Units by mouth daily.    . clopidogrel (PLAVIX) 75 MG tablet TAKE 1 TABLET BY MOUTH EVERY DAY 90 tablet 1  . donepezil (ARICEPT) 10 MG tablet Take 1 tablet (10 mg total) by mouth daily. 90 tablet 3  . glipiZIDE (GLUCOTROL XL) 5  MG 24 hr tablet Take 1 tablet (5 mg total) by mouth daily with breakfast. 90 tablet 3  . Insulin Glargine (LANTUS SOLOSTAR) 100 UNIT/ML Solostar Pen Inject 20 Units into the skin at bedtime. 10 pen 3  . Insulin Pen Needle 32G X 4 MM MISC Use 1x a day 100 each 3  . metFORMIN (GLUCOPHAGE) 500 MG tablet Take 1 tablet (500 mg total) by mouth 2 (two) times daily with a meal. 180 tablet 3  . ONE TOUCH ULTRA TEST test strip USE AS INSTRUCTED 100 each 2  . pantoprazole (PROTONIX) 40 MG tablet TAKE 1 TABLET (40 MG TOTAL) BY MOUTH DAILY. 90 tablet 0  . saxagliptin HCl (ONGLYZA) 5 MG TABS tablet Take 1 tablet (5 mg total) by mouth daily. (Patient not taking: Reported on 05/31/2018) 15 tablet 0  . sertraline (ZOLOFT) 50 MG tablet TAKE 1 TABLET BY MOUTH EVERYDAY AT BEDTIME 90 tablet 2   No current facility-administered medications on file prior to visit.     ALLERGIES: Allergies  Allergen Reactions  . Dilaudid [Hydromorphone Hcl] Other (See Comments)    hallucinations  . Fentanyl Other (See Comments)    hallucinations  FAMILY HISTORY: Family History  Problem Relation Age of Onset  . Heart attack Father   . Dementia Mother   . Ovarian cancer Paternal Grandmother   . Breast cancer Sister   . Colon cancer Neg Hx   . Esophageal cancer Neg Hx   . Rectal cancer Neg Hx   . Stomach cancer Neg Hx     SOCIAL HISTORY: Social History   Socioeconomic History  . Marital status: Married    Spouse name: Charlett Nose  . Number of children: 2  . Years of education: Not on file  . Highest education level: Not on file  Occupational History  . 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 Financial resource strain: Not on file  . Food insecurity:    Worry: Not on file    Inability: Not on file  . Transportation needs:    Medical: Not on file    Non-medical: Not on file  Tobacco Use  . Smoking status: Never Smoker  . Smokeless tobacco: Never Used  Substance and Sexual Activity  .  Alcohol use: No    Alcohol/week: 0.0 standard drinks  . Drug use: No  . Sexual activity: Never  Lifestyle  . Physical activity:    Days per week: Not on file    Minutes per session: Not on file  . Stress: Not on file  Relationships  . Social connections:    Talks on phone: Not on file    Gets together: Not on file    Attends religious service: Not on file    Active member of club or organization: Not on file    Attends meetings of clubs or organizations: Not on file    Relationship status: Not on file  . Intimate partner violence:    Fear of current or ex partner: Not on file    Emotionally abused: Not on file    Physically abused: Not on file    Forced sexual activity: Not on file  Other Topics Concern  . Not on file  Social History Narrative   Patient drinks 4 cups of a caffinated Drinks week.    REVIEW OF SYSTEMS: Constitutional: No fevers, chills, or sweats, no generalized fatigue, change in appetite Eyes: No visual changes, double vision, eye pain Ear, nose and throat: No hearing loss, ear pain, nasal congestion, sore throat Cardiovascular: No chest pain, palpitations Respiratory:  No shortness of breath at rest or with exertion, wheezes GastrointestinaI: No nausea, vomiting, diarrhea, abdominal pain, fecal incontinence Genitourinary:  No dysuria, urinary retention or frequency Musculoskeletal:  No neck pain, back pain Integumentary: No rash, pruritus, skin lesions Neurological: as above Psychiatric: No depression, insomnia, anxiety Endocrine: No palpitations, fatigue, diaphoresis, mood swings, change in appetite, change in weight, increased thirst Hematologic/Lymphatic:  No anemia, purpura, petechiae. Allergic/Immunologic: no itchy/runny eyes, nasal congestion, recent allergic reactions, rashes  PHYSICAL EXAM: Vitals:   05/31/18 1621  BP: 136/64  Pulse: 74  SpO2: 98%   General: No acute distress Head:  Normocephalic/atraumatic Neck: supple, no paraspinal  tenderness, full range of motion Heart:  Regular rate and rhythm Lungs:  Clear to auscultation bilaterally Back: No paraspinal tenderness Skin/Extremities: No rash, no edema Neurological Exam: alert and oriented to person, state, year. States he is in [place], [place], it is April/summer. No aphasia or dysarthria. Fund of knowledge is reduced.  Recent and remote memory are impaired.  Attention and concentration are reduced    Able to name objects and  repeat phrases. CDT 4/5 MMSE - Mini Mental State Exam 05/31/2018 05/21/2017 05/13/2016  Orientation to time 1 2 3   Orientation to Place 3 4 4   Registration 3 3 3   Attention/ Calculation 0 3 3  Recall 0 1 0  Language- name 2 objects 2 2 2   Language- repeat 1 1 1   Language- follow 3 step command 3 3 3   Language- read & follow direction 1 1 1   Write a sentence 1 1 1   Copy design 0 1 1  Total score 15 22 22    Cranial Nerves: Pupils equal, round, reactive to light. Extraocular movements intact with no nystagmus. Visual fields full. Facial sensation intact. No facial asymmetry. Tongue, uvula, palate midline.  Motor: Bulk and tone normal, muscle strength 4/5 right UE and LE, 5/5 on left, no pronator drift.  Sensation to light touch intact.  No extinction to double simultaneous stimulation. Finger to nose testing intact.  Gait narrow-based and steady, able to tandem walk adequately.  Romberg negative.  IMPRESSION:4/5 right UE and LE This is a 78 yo RH man with vascular risk factors including hypertension, diabetes, hyperlipidemia, peripheral vascular disease with 2 strokes in Nov/December 2015 due to left ICA occlusion, intracranial atherosclerosis, previous TIA in 2012, with mild to moderate dementia, likely vascular in etiology. MMSE today 15/30 (22/30 in February 2019, 22/30 in January 2018, 24/30 in April 2017). We discussed addition of Memantine to Donepezil, start Memantine 10mg  qhs x 1 week, then increase to 10mg  BID. Side effects discussed.  He does not drive. Continue 24/7 supervision. Support provided to wife who appears to have caregiver fatigue. Daughter is helping them more now. Continue aspirin and Plavix, control of vascular risk factors for secondary stroke prevention. The importance of physical exercise and brain stimulation exercises for brain health were again discussed. He will follow-up in 6 months and knows to call for any changes.   Thank you for allowing me to participate in his care.  Please do not hesitate to call for any questions or concerns.  The duration of this appointment visit was 30 minutes of face-to-face time with the patient.  Greater than 50% of this time was spent in counseling, explanation of diagnosis, planning of further management, and coordination of care.   Ellouise Newer, [name]   CC: Heide Scales, NP

## 2018-06-26 ENCOUNTER — Other Ambulatory Visit: Payer: Self-pay | Admitting: Neurology

## 2018-06-26 DIAGNOSIS — F0390 Unspecified dementia without behavioral disturbance: Secondary | ICD-10-CM

## 2018-06-26 DIAGNOSIS — F03B0 Unspecified dementia, moderate, without behavioral disturbance, psychotic disturbance, mood disturbance, and anxiety: Secondary | ICD-10-CM

## 2018-06-28 ENCOUNTER — Ambulatory Visit: Payer: Medicare Other | Admitting: Neurology

## 2018-07-04 DIAGNOSIS — E039 Hypothyroidism, unspecified: Secondary | ICD-10-CM | POA: Diagnosis not present

## 2018-07-04 DIAGNOSIS — E559 Vitamin D deficiency, unspecified: Secondary | ICD-10-CM | POA: Diagnosis not present

## 2018-07-04 DIAGNOSIS — E119 Type 2 diabetes mellitus without complications: Secondary | ICD-10-CM | POA: Diagnosis not present

## 2018-08-31 ENCOUNTER — Telehealth: Payer: Self-pay | Admitting: Neurology

## 2018-08-31 NOTE — Telephone Encounter (Signed)
Patient's daughter called and needs to let Dr. Delice Lesch know that she thinks her dad had Mini Strokes. She said that his Speech is Slurred and that his mouth is drawn. He is acting fine and talking to them. Please Call. Thanks

## 2018-09-01 ENCOUNTER — Other Ambulatory Visit: Payer: Self-pay

## 2018-09-01 ENCOUNTER — Telehealth (INDEPENDENT_AMBULATORY_CARE_PROVIDER_SITE_OTHER): Payer: Medicare Other | Admitting: Neurology

## 2018-09-01 DIAGNOSIS — R29810 Facial weakness: Secondary | ICD-10-CM

## 2018-09-01 DIAGNOSIS — I63232 Cerebral infarction due to unspecified occlusion or stenosis of left carotid arteries: Secondary | ICD-10-CM

## 2018-09-01 DIAGNOSIS — F0390 Unspecified dementia without behavioral disturbance: Secondary | ICD-10-CM

## 2018-09-01 DIAGNOSIS — I672 Cerebral atherosclerosis: Secondary | ICD-10-CM

## 2018-09-01 DIAGNOSIS — R471 Dysarthria and anarthria: Secondary | ICD-10-CM

## 2018-09-01 DIAGNOSIS — F03B0 Unspecified dementia, moderate, without behavioral disturbance, psychotic disturbance, mood disturbance, and anxiety: Secondary | ICD-10-CM

## 2018-09-01 DIAGNOSIS — E119 Type 2 diabetes mellitus without complications: Secondary | ICD-10-CM | POA: Diagnosis not present

## 2018-09-01 NOTE — Progress Notes (Signed)
Virtual Visit via Video Note The purpose of this virtual visit is to provide medical care while limiting exposure to the novel coronavirus.    Consent was obtained for video visit:  Yes.   Answered questions that patient had about telehealth interaction:  Yes.   I discussed the limitations, risks, security and privacy concerns of performing an evaluation and management service by telemedicine. I also discussed with the patient that there may be a patient responsible charge related to this service. The patient expressed understanding and agreed to proceed.  Pt location: Home Physician Location: office Name of referring provider:  Imagene Riches, NP I connected with Delray Alt at patients initiation/request on 09/01/2018 at 12:00 PM EDT by video enabled telemedicine application and verified that I am speaking with the correct person using two identifiers. Pt MRN:  [id number] Pt DOB:  February 08, 1941 Video Participants:  Delray Alt;  Adair Laundry (wife); daughter  History of Present Illness:  The patient was last seen in February 2020 for dementia. His wife and daughter are present to provide additional information, an urgent follow-up was scheduled due to new symptoms of facial droop and slurred speech. His family started noticing the slurred speech and right facial droop 2 days ago. He has residual mild right-sided weakness and there have been no new changes in his right arm or leg. No difficulties swallowing. He denies any headaches, dizziness, vision changes, no falls. They have had a significant difficult time getting him to drink water, "he will not drink any water." His glucose levels have been elevated consistently, 267 yesterday. His daughter has been staying with them the past 2 weeks and he has been acting a little different, staying up all night and not sleeping good at all.  HPI: This is a pleasant 78 yo RH man with vascular risk factors including hypertension, diabetes,  hyperlipidemia, peripheral vascular disease, TIA in 2012, and left MCA watershed infarcts secondary to left ICA stenosis, presenting for evaluation of worsening memory. He had previously seen neurologist Dr. Leonie Man in 2014 with his wife reporting mild memory difficulties since around 2012. He had mostly short-term memory difficulties, needing to be told several times and not remembering. His MMSE at that time was 27/30. His wife reports that after his strokes in November and December 2015, memory has been much worse. Hospital records were reviewed, in November he presented with aphasia, MRI showed left frontal and parietal watershed infarcts. His carotid angiogram showed severe preocclusive stenosis of the left proximal ICA, 80-85% stenosis of the right ICA proximal cavernous segment, 80% stenosis of the dominant left vertebral basilar junction just proximal to the basilar artery. He was discharged on aspirin and Plavix. He returned to the ER a month later with confusion, anomia, and ataxia. MRI showed multiple ischemic infarcts in the left frontal and parietal lobes, as well as the posterior left temporal lobe at the temporoparietal junction, cerebral angiogram showed interval progression of stenosis of left ICA distal to origin of ophthalmic artery with severe near complete occlusion of proximal cavernous left ICA, 70% stenosis of proximal cavernous right ICA, and 60% stenosis of dominant left vertebral artery at origin. There was unsuccessful attempt at revascularization of occluded left ICA cavernous segment, and note of multiple collaterals in the left ICA cavernous occluded segment. He was discharged home with SBP goal between 130 to 160 and control of vascular risk factors.   He feels his memory is "average, there are times I cannot remember." His wife  reports he is not remembering things he used to know to do, such as how to fix things around the house and which are the right tools to use. He has had  word-finding difficulties since the stroke, his wife notices this once in a while. He forgets to take his medications, his wife now sets this out for him. He denies getting lost driving, his wife is concerned "somewhat," he occasionally cannot recall the way to go somewhere. He occasionally gets mad and frustrated, but no paranoia. He denies any alcohol or tobacco use, no significant head injuries. His mother had memory changes in her 35s.      Current Outpatient Medications on File Prior to Visit  Medication Sig Dispense Refill  . ALPRAZolam (XANAX) 0.5 MG tablet 1/2-1 tab BID as needed for anxiety 60 tablet 5  . aspirin EC 81 MG EC tablet Take 1 tablet (81 mg total) by mouth daily. 30 tablet 0  . atorvastatin (LIPITOR) 80 MG tablet TAKE 1 TABLET BY MOUTH DAILY AT 6PM 90 tablet 3  . cholecalciferol (VITAMIN D) 1000 units tablet Take 2,000 Units by mouth daily.    . clopidogrel (PLAVIX) 75 MG tablet TAKE 1 TABLET BY MOUTH EVERY DAY 90 tablet 1  . donepezil (ARICEPT) 10 MG tablet Take 1 tablet (10 mg total) by mouth daily. 90 tablet 3  . glipiZIDE (GLUCOTROL XL) 5 MG 24 hr tablet Take 1 tablet (5 mg total) by mouth daily with breakfast. 90 tablet 3  . Insulin Glargine (LANTUS SOLOSTAR) 100 UNIT/ML Solostar Pen Inject 20 Units into the skin at bedtime. 10 pen 3  . Insulin Pen Needle 32G X 4 MM MISC Use 1x a day 100 each 3  . memantine (NAMENDA) 10 MG tablet TAKE 1 TABLET EVERY NIGHT 1 WEEK, THEN INCREASE TO 1 TABLET TWICE A DAY 180 tablet 4  . metFORMIN (GLUCOPHAGE) 500 MG tablet Take 1 tablet (500 mg total) by mouth 2 (two) times daily with a meal. 180 tablet 3  . ONE TOUCH ULTRA TEST test strip USE AS INSTRUCTED 100 each 2  . pantoprazole (PROTONIX) 40 MG tablet TAKE 1 TABLET (40 MG TOTAL) BY MOUTH DAILY. 90 tablet 0  . saxagliptin HCl (ONGLYZA) 5 MG TABS tablet Take 1 tablet (5 mg total) by mouth daily. 15 tablet 0  . sertraline (ZOLOFT) 50 MG tablet TAKE 1 TABLET BY MOUTH EVERYDAY AT BEDTIME 90  tablet 2   No current facility-administered medications on file prior to visit.      Observations/Objective:   GEN:  The patient appears stated age and is in NAD. Neurological examination: Patient is awake, alert, oriented to person, place. There is mild dysarthria. Cranial nerves: Extraocular movements intact with no nystagmus. There is new right facial droop noted. No weakness of frontalis or orbicularis oris noted on video. Motor: moves all extremities symmetrically, at least anti-gravity x 4. Family asked to test strength and report equal resistance. No incoordination on finger to nose testing. Gait: not tested.  Assessment and Plan:   This is a 78 yo RH man with vascular risk factors including hypertension, diabetes, hyperlipidemia, peripheral vascular disease with 2 strokes in Nov/December 2015 due to left ICA occlusion, intracranial atherosclerosis, previous TIA in 2012, with mild to moderate dementia, likely vascular, presenting for an urgent visit due to new symptoms of right facial droop for the past 2 days. He is now out of TPA window. Exam limited on video, he does have new right facial weakness and  slurred speech, extremities do not seem to be involved. He likely has had a small subcortical stroke due to small vessel disease. MRI brain without contrast will be ordered. We discussed intracranial stenosis and avoiding hypotension, increasing fluid intake. Family will start monitoring BP. We also discussed control of vascular risk factors, he has been consistently hyperglycemic. Continue aspirin and Plavix. Family advised to go to ER if there is progressive worsening of symptoms. Home speech therapy will be ordered. Follow-up as scheduled in September, they know to call for any changes.   Follow Up Instructions:   -I discussed the assessment and treatment plan with the patient/family. The patient/family were provided an opportunity to ask questions and all were answered. The patient/family  agreed with the plan and demonstrated an understanding of the instructions.   The patient/family was advised to call back or seek an in-person evaluation if the symptoms worsen or if the condition fails to improve as anticipated.   Cameron Sprang, MD

## 2018-09-01 NOTE — Telephone Encounter (Signed)
Pls let daughter know that if they feel he has had a stroke, he may need to go to ER. Is he having any weakness in his arms/legs? Any difficulty swallowing? If they feel he is fine, ok for e-visit, thanks

## 2018-09-01 NOTE — Telephone Encounter (Signed)
Pt scheduled for evisit today at noon, spoke with pt daughter she did not notice any weakness still has slurred speech, no problems swallowing,

## 2018-09-02 ENCOUNTER — Telehealth: Payer: Self-pay | Admitting: Neurology

## 2018-09-02 ENCOUNTER — Other Ambulatory Visit: Payer: Self-pay

## 2018-09-02 ENCOUNTER — Inpatient Hospital Stay (HOSPITAL_COMMUNITY)
Admission: EM | Admit: 2018-09-02 | Discharge: 2018-09-04 | DRG: 066 | Disposition: A | Discharge status: Home Health | Payer: Medicare Other | Attending: Internal Medicine | Admitting: Internal Medicine

## 2018-09-02 ENCOUNTER — Encounter (HOSPITAL_COMMUNITY): Payer: Self-pay | Admitting: *Deleted

## 2018-09-02 ENCOUNTER — Emergency Department (HOSPITAL_COMMUNITY): Payer: Medicare Other

## 2018-09-02 ENCOUNTER — Observation Stay (HOSPITAL_COMMUNITY): Payer: Medicare Other

## 2018-09-02 DIAGNOSIS — I639 Cerebral infarction, unspecified: Secondary | ICD-10-CM

## 2018-09-02 DIAGNOSIS — R569 Unspecified convulsions: Secondary | ICD-10-CM | POA: Diagnosis not present

## 2018-09-02 DIAGNOSIS — I6523 Occlusion and stenosis of bilateral carotid arteries: Secondary | ICD-10-CM | POA: Diagnosis not present

## 2018-09-02 DIAGNOSIS — Z7982 Long term (current) use of aspirin: Secondary | ICD-10-CM | POA: Diagnosis not present

## 2018-09-02 DIAGNOSIS — R29818 Other symptoms and signs involving the nervous system: Secondary | ICD-10-CM | POA: Diagnosis present

## 2018-09-02 DIAGNOSIS — N183 Chronic kidney disease, stage 3 unspecified: Secondary | ICD-10-CM

## 2018-09-02 DIAGNOSIS — Z85528 Personal history of other malignant neoplasm of kidney: Secondary | ICD-10-CM

## 2018-09-02 DIAGNOSIS — R9431 Abnormal electrocardiogram [ECG] [EKG]: Secondary | ICD-10-CM | POA: Diagnosis not present

## 2018-09-02 DIAGNOSIS — R29810 Facial weakness: Secondary | ICD-10-CM | POA: Diagnosis not present

## 2018-09-02 DIAGNOSIS — E1122 Type 2 diabetes mellitus with diabetic chronic kidney disease: Secondary | ICD-10-CM

## 2018-09-02 DIAGNOSIS — Z03818 Encounter for observation for suspected exposure to other biological agents ruled out: Secondary | ICD-10-CM | POA: Diagnosis not present

## 2018-09-02 DIAGNOSIS — K219 Gastro-esophageal reflux disease without esophagitis: Secondary | ICD-10-CM | POA: Diagnosis present

## 2018-09-02 DIAGNOSIS — Z1159 Encounter for screening for other viral diseases: Secondary | ICD-10-CM | POA: Diagnosis not present

## 2018-09-02 DIAGNOSIS — F329 Major depressive disorder, single episode, unspecified: Secondary | ICD-10-CM | POA: Diagnosis present

## 2018-09-02 DIAGNOSIS — I10 Essential (primary) hypertension: Secondary | ICD-10-CM | POA: Diagnosis not present

## 2018-09-02 DIAGNOSIS — R29708 NIHSS score 8: Secondary | ICD-10-CM | POA: Diagnosis not present

## 2018-09-02 DIAGNOSIS — Z8041 Family history of malignant neoplasm of ovary: Secondary | ICD-10-CM

## 2018-09-02 DIAGNOSIS — R4701 Aphasia: Secondary | ICD-10-CM | POA: Diagnosis not present

## 2018-09-02 DIAGNOSIS — R130 Aphagia: Secondary | ICD-10-CM | POA: Diagnosis not present

## 2018-09-02 DIAGNOSIS — Z794 Long term (current) use of insulin: Secondary | ICD-10-CM

## 2018-09-02 DIAGNOSIS — R4781 Slurred speech: Secondary | ICD-10-CM | POA: Diagnosis not present

## 2018-09-02 DIAGNOSIS — Z87442 Personal history of urinary calculi: Secondary | ICD-10-CM

## 2018-09-02 DIAGNOSIS — F419 Anxiety disorder, unspecified: Secondary | ICD-10-CM | POA: Diagnosis present

## 2018-09-02 DIAGNOSIS — Z803 Family history of malignant neoplasm of breast: Secondary | ICD-10-CM | POA: Diagnosis not present

## 2018-09-02 DIAGNOSIS — Z8673 Personal history of transient ischemic attack (TIA), and cerebral infarction without residual deficits: Secondary | ICD-10-CM

## 2018-09-02 DIAGNOSIS — F0390 Unspecified dementia without behavioral disturbance: Secondary | ICD-10-CM | POA: Diagnosis present

## 2018-09-02 DIAGNOSIS — I129 Hypertensive chronic kidney disease with stage 1 through stage 4 chronic kidney disease, or unspecified chronic kidney disease: Secondary | ICD-10-CM | POA: Diagnosis present

## 2018-09-02 DIAGNOSIS — K589 Irritable bowel syndrome without diarrhea: Secondary | ICD-10-CM | POA: Diagnosis not present

## 2018-09-02 DIAGNOSIS — Z8249 Family history of ischemic heart disease and other diseases of the circulatory system: Secondary | ICD-10-CM | POA: Diagnosis not present

## 2018-09-02 DIAGNOSIS — Z7902 Long term (current) use of antithrombotics/antiplatelets: Secondary | ICD-10-CM | POA: Diagnosis not present

## 2018-09-02 DIAGNOSIS — Z79899 Other long term (current) drug therapy: Secondary | ICD-10-CM

## 2018-09-02 DIAGNOSIS — E785 Hyperlipidemia, unspecified: Secondary | ICD-10-CM | POA: Diagnosis present

## 2018-09-02 DIAGNOSIS — M109 Gout, unspecified: Secondary | ICD-10-CM | POA: Diagnosis not present

## 2018-09-02 DIAGNOSIS — I63412 Cerebral infarction due to embolism of left middle cerebral artery: Principal | ICD-10-CM | POA: Diagnosis present

## 2018-09-02 LAB — I-STAT CHEM 8, ED
BUN: 35 mg/dL — ABNORMAL HIGH (ref 8–23)
Calcium, Ion: 1.25 mmol/L (ref 1.15–1.40)
Chloride: 114 mmol/L — ABNORMAL HIGH (ref 98–111)
Creatinine, Ser: 1.7 mg/dL — ABNORMAL HIGH (ref 0.61–1.24)
Glucose, Bld: 194 mg/dL — ABNORMAL HIGH (ref 70–99)
HCT: 27 % — ABNORMAL LOW (ref 39.0–52.0)
Hemoglobin: 9.2 g/dL — ABNORMAL LOW (ref 13.0–17.0)
Potassium: 4.9 mmol/L (ref 3.5–5.1)
Sodium: 141 mmol/L (ref 135–145)
TCO2: 20 mmol/L — ABNORMAL LOW (ref 22–32)

## 2018-09-02 LAB — DIFFERENTIAL
Abs Immature Granulocytes: 0.03 10*3/uL (ref 0.00–0.07)
Basophils Absolute: 0.1 10*3/uL (ref 0.0–0.1)
Basophils Relative: 1 %
Eosinophils Absolute: 0.3 10*3/uL (ref 0.0–0.5)
Eosinophils Relative: 4 %
Immature Granulocytes: 0 %
Lymphocytes Relative: 11 %
Lymphs Abs: 0.8 10*3/uL (ref 0.7–4.0)
Monocytes Absolute: 0.6 10*3/uL (ref 0.1–1.0)
Monocytes Relative: 8 %
Neutro Abs: 5.4 10*3/uL (ref 1.7–7.7)
Neutrophils Relative %: 76 %

## 2018-09-02 LAB — CBC
HCT: 29.3 % — ABNORMAL LOW (ref 39.0–52.0)
Hemoglobin: 8.8 g/dL — ABNORMAL LOW (ref 13.0–17.0)
MCH: 29.6 pg (ref 26.0–34.0)
MCHC: 30 g/dL (ref 30.0–36.0)
MCV: 98.7 fL (ref 80.0–100.0)
Platelets: 217 10*3/uL (ref 150–400)
RBC: 2.97 MIL/uL — ABNORMAL LOW (ref 4.22–5.81)
RDW: 12.5 % (ref 11.5–15.5)
WBC: 7.1 10*3/uL (ref 4.0–10.5)
nRBC: 0 % (ref 0.0–0.2)

## 2018-09-02 LAB — COMPREHENSIVE METABOLIC PANEL
ALT: 14 U/L (ref 0–44)
AST: 13 U/L — ABNORMAL LOW (ref 15–41)
Albumin: 3.2 g/dL — ABNORMAL LOW (ref 3.5–5.0)
Alkaline Phosphatase: 78 U/L (ref 38–126)
Anion gap: 9 (ref 5–15)
BUN: 37 mg/dL — ABNORMAL HIGH (ref 8–23)
CO2: 20 mmol/L — ABNORMAL LOW (ref 22–32)
Calcium: 9.6 mg/dL (ref 8.9–10.3)
Chloride: 113 mmol/L — ABNORMAL HIGH (ref 98–111)
Creatinine, Ser: 1.64 mg/dL — ABNORMAL HIGH (ref 0.61–1.24)
GFR calc Af Amer: 46 mL/min — ABNORMAL LOW (ref >60)
GFR calc non Af Amer: 39 mL/min — ABNORMAL LOW (ref >60)
Glucose, Bld: 197 mg/dL — ABNORMAL HIGH (ref 70–99)
Potassium: 5 mmol/L (ref 3.5–5.1)
Sodium: 142 mmol/L (ref 135–145)
Total Bilirubin: 0.4 mg/dL (ref 0.3–1.2)
Total Protein: 6.3 g/dL — ABNORMAL LOW (ref 6.5–8.1)

## 2018-09-02 LAB — PROTIME-INR
INR: 1 (ref 0.8–1.2)
Prothrombin Time: 13.4 seconds (ref 11.4–15.2)

## 2018-09-02 LAB — SARS CORONAVIRUS 2 BY RT PCR (HOSPITAL ORDER, PERFORMED IN ~~LOC~~ HOSPITAL LAB): SARS Coronavirus 2: NEGATIVE

## 2018-09-02 LAB — GLUCOSE, CAPILLARY: Glucose-Capillary: 103 mg/dL — ABNORMAL HIGH (ref 70–99)

## 2018-09-02 LAB — APTT: aPTT: 26 seconds (ref 24–36)

## 2018-09-02 LAB — CBG MONITORING, ED: Glucose-Capillary: 183 mg/dL — ABNORMAL HIGH (ref 70–99)

## 2018-09-02 MED ORDER — ACETAMINOPHEN 160 MG/5ML PO SOLN: 650.0000 mg | ORAL | Status: DC | PRN

## 2018-09-02 MED ORDER — VITAMIN D 25 MCG (1000 UNIT) PO TABS
2000.0000 [IU] | ORAL_TABLET | Freq: Every day | ORAL | Status: DC
  Administered 2018-09-03 – 2018-09-04 (×2): 2000 [IU] via ORAL
  Filled 2018-09-02 (×2): qty 2

## 2018-09-02 MED ORDER — ACETAMINOPHEN 325 MG PO TABS: 650.0000 mg | ORAL_TABLET | ORAL | Status: DC | PRN

## 2018-09-02 MED ORDER — SENNOSIDES-DOCUSATE SODIUM 8.6-50 MG PO TABS: 1.0000 | ORAL_TABLET | Freq: Every evening | ORAL | Status: DC | PRN

## 2018-09-02 MED ORDER — SODIUM CHLORIDE 0.9 % IV SOLN
INTRAVENOUS | Status: DC
  Administered 2018-09-02: 23:00:00 via INTRAVENOUS

## 2018-09-02 MED ORDER — IOHEXOL 350 MG/ML SOLN
100.0000 mL | Freq: Once | INTRAVENOUS | Status: AC | PRN
  Administered 2018-09-02: 19:00:00 100 mL via INTRAVENOUS

## 2018-09-02 MED ORDER — INSULIN ASPART 100 UNIT/ML ~~LOC~~ SOLN
0.0000 [IU] | Freq: Three times a day (TID) | SUBCUTANEOUS | Status: DC
  Administered 2018-09-03 (×2): 1 [IU] via SUBCUTANEOUS
  Administered 2018-09-04: 2 [IU] via SUBCUTANEOUS

## 2018-09-02 MED ORDER — STROKE: EARLY STAGES OF RECOVERY BOOK
Freq: Once | Status: AC
  Administered 2018-09-02: 23:00:00
  Filled 2018-09-02: qty 1

## 2018-09-02 MED ORDER — SERTRALINE HCL 50 MG PO TABS
50.0000 mg | ORAL_TABLET | Freq: Every day | ORAL | Status: DC
  Administered 2018-09-02 – 2018-09-03 (×2): 50 mg via ORAL
  Filled 2018-09-02 (×2): qty 1

## 2018-09-02 MED ORDER — ENOXAPARIN SODIUM 40 MG/0.4ML ~~LOC~~ SOLN
40.0000 mg | SUBCUTANEOUS | Status: DC
  Administered 2018-09-02 – 2018-09-03 (×2): 40 mg via SUBCUTANEOUS
  Filled 2018-09-02 (×2): qty 0.4

## 2018-09-02 MED ORDER — ATORVASTATIN CALCIUM 80 MG PO TABS
80.0000 mg | ORAL_TABLET | Freq: Every day | ORAL | Status: DC
  Administered 2018-09-03: 17:00:00 80 mg via ORAL
  Filled 2018-09-02 (×2): qty 1

## 2018-09-02 MED ORDER — DONEPEZIL HCL 10 MG PO TABS
10.0000 mg | ORAL_TABLET | Freq: Every day | ORAL | Status: DC
  Administered 2018-09-02 – 2018-09-03 (×2): 10 mg via ORAL
  Filled 2018-09-02 (×2): qty 1

## 2018-09-02 MED ORDER — PANTOPRAZOLE SODIUM 40 MG PO TBEC
40.0000 mg | DELAYED_RELEASE_TABLET | Freq: Every day | ORAL | Status: DC
  Administered 2018-09-03 – 2018-09-04 (×2): 40 mg via ORAL
  Filled 2018-09-02 (×2): qty 1

## 2018-09-02 MED ORDER — ASPIRIN EC 81 MG PO TBEC
81.0000 mg | DELAYED_RELEASE_TABLET | Freq: Every day | ORAL | Status: DC
  Administered 2018-09-02 – 2018-09-04 (×3): 81 mg via ORAL
  Filled 2018-09-02 (×3): qty 1

## 2018-09-02 MED ORDER — ALPRAZOLAM 0.5 MG PO TABS
0.5000 mg | ORAL_TABLET | Freq: Two times a day (BID) | ORAL | Status: DC
  Administered 2018-09-02 – 2018-09-04 (×4): 0.5 mg via ORAL
  Filled 2018-09-02 (×4): qty 1

## 2018-09-02 MED ORDER — INSULIN GLARGINE 100 UNIT/ML ~~LOC~~ SOLN
20.0000 [IU] | Freq: Every day | SUBCUTANEOUS | Status: DC
  Administered 2018-09-02 – 2018-09-03 (×2): 20 [IU] via SUBCUTANEOUS
  Filled 2018-09-02 (×3): qty 0.2

## 2018-09-02 MED ORDER — SODIUM CHLORIDE 0.9% FLUSH: 3.0000 mL | Freq: Once | INTRAVENOUS | Status: DC

## 2018-09-02 MED ORDER — ACETAMINOPHEN 650 MG RE SUPP: 650.0000 mg | RECTAL | Status: DC | PRN

## 2018-09-02 MED ORDER — CLOPIDOGREL BISULFATE 75 MG PO TABS
75.0000 mg | ORAL_TABLET | Freq: Every day | ORAL | Status: DC
  Administered 2018-09-02 – 2018-09-04 (×3): 75 mg via ORAL
  Filled 2018-09-02 (×3): qty 1

## 2018-09-02 MED ORDER — HYDRALAZINE HCL 20 MG/ML IJ SOLN: 5.0000 mg | INTRAMUSCULAR | Status: DC | PRN

## 2018-09-02 MED ORDER — INSULIN ASPART 100 UNIT/ML ~~LOC~~ SOLN: 0.0000 [IU] | Freq: Every day | SUBCUTANEOUS | Status: DC

## 2018-09-02 MED ORDER — MEMANTINE HCL 10 MG PO TABS
10.0000 mg | ORAL_TABLET | Freq: Two times a day (BID) | ORAL | Status: DC
  Administered 2018-09-03 – 2018-09-04 (×4): 10 mg via ORAL
  Filled 2018-09-02 (×5): qty 1

## 2018-09-02 NOTE — ED Provider Notes (Signed)
[facility] EMERGENCY DEPARTMENT Provider Note   CSN: [id number] Arrival date & time: 09/02/18  1803    History   Chief Complaint No chief complaint on file.   HPI Peter Nicholson is a 78 y.o. male.     78 yo M with a chief complaint of right-sided weakness and right-sided facial droop this been going on for the past few days.  Patient was made a code stroke on arrival.  Level 5 caveat acuity of condition.  The history is provided by the patient.  Illness  Severity:  Severe Onset quality:  Sudden Duration:  2 days Timing:  Constant Progression:  Worsening Chronicity:  New Associated symptoms: no abdominal pain, no chest pain, no congestion, no diarrhea, no fever, no headaches, no myalgias, no rash, no shortness of breath and no vomiting     Past Medical History:  Diagnosis Date   Anxiety state, unspecified    Blood transfusion without reported diagnosis    Bowel obstruction (HCC)    Calculus of kidney    Cerebrovascular disease, unspecified    Diaphragmatic hernia without mention of obstruction or gangrene    Diverticulosis of colon (without mention of hemorrhage)    Esophageal reflux    Gout, unspecified    History of gallstones    History of seizures    HTN (hypertension)    IBS (irritable bowel syndrome)    Kidney carcinoma (HCC)    Lumbago    Obstructive sleep apnea (adult) (pediatric)    doesnt wear CPAP   Osteoarthrosis, unspecified whether generalized or localized, unspecified site    tendonitis   Other specified congenital anomaly of kidney    Overweight(278.02)    Pure hypercholesterolemia    Seizures ([place])    last seizure November 2012   Stroke [facility])    2001   Type II or unspecified type diabetes mellitus without mention of complication, not stated as uncontrolled    Unspecified venous (peripheral) insufficiency     Patient Active Problem List   Diagnosis Date Noted   Neurological deficit present  09/02/2018   Abscess of back 11/23/2017   Vascular dementia ([place]) 10/07/2017   Syncope 06/27/2017   CKD (chronic kidney disease), stage III ([place]) 06/27/2017   Anemia of chronic disease 06/08/2016   Dementia ([place]) 05/23/2016   Bilateral impacted cerumen 05/06/2016   Mixed conductive and sensorineural hearing loss of both ears 05/06/2016   Acute lacunar infarction ([place]) 10/27/2015   History of CVA (cerebrovascular accident) 10/25/2015   Type 2 diabetes mellitus with stage 3 chronic kidney disease, with long-term current use of insulin ([place]) 10/25/2015   Stroke-like symptoms 10/25/2015   Slurred speech 10/25/2015   History of stroke 08/02/2015   Hyperlipidemia 08/02/2015   Inflamed sebaceous cyst 03/11/2015   B12 deficiency 09/25/2014   Mild cognitive impairment 09/24/2014   Cerebral infarction due to thrombosis of left carotid artery ([place])    Stroke ([place])    Confusion 04/09/2014   Cerebrovascular disease    HLD (hyperlipidemia)    Carotid stenosis    Tachycardia 02/26/2014   Dysesthesia 06/10/2012   Abdominal bruit 06/10/2012   Other specified transient cerebral ischemias 06/10/2012   Occlusion and stenosis of carotid artery without mention of cerebral infarction 06/10/2012   Sudden visual loss 06/10/2012   Memory impairment 04/25/2012   Dysphagia 01/18/2012   Renal cell cancer ([place]) 01/18/2012   Acute UTI 04/28/2011   Renal insufficiency 04/28/2011   TIA (transient ischemic attack) 03/16/2011  Closed fracture of tibial plateau 03/11/2011   Vitamin B12 deficiency 07/25/2008   OVERWEIGHT 06/06/2008   ESOPHAGEAL STRICTURE 03/14/2008   CONSTIPATION 03/14/2008   OBSTRUCTIVE SLEEP APNEA 11/28/2007   VENOUS INSUFFICIENCY 11/28/2007   DIVERTICULOSIS OF COLON 11/28/2007   NEPHROLITHIASIS 11/28/2007   Horseshoe kidney 11/28/2007   HYPERCHOLESTEROLEMIA 03/22/2007   GOUT 03/22/2007   ANXIETY 03/22/2007   Essential hypertension  03/22/2007   GERD 03/22/2007   HIATAL HERNIA 03/22/2007   Osteoarthritis 03/22/2007   BACK PAIN, LUMBAR 03/22/2007    Past Surgical History:  Procedure Laterality Date   CAROTID ENDARTERECTOMY  09/2000   Dr.Lawson   CHOLECYSTECTOMY  9/04   Dr. Rebekah [place]   EXTERNAL FIXATION LEG  03/10/2011   Procedure: EXTERNAL FIXATION LEG;  Surgeon: Rozanna Box;  Location: [facility];  Service: Orthopedics;  Laterality: Right;   INCISIONAL HERNIA REPAIR  1/96   Dr.Leone   NASAL SINUS SURGERY  12/96   Dr.Redman   NEPHRECTOMY  1994   renal cell cancer in horseshoe kidney; right   ORIF TIBIA PLATEAU  03/24/2011   Procedure: OPEN REDUCTION INTERNAL FIXATION (ORIF) TIBIAL PLATEAU;  Surgeon: Rozanna Box;  Location: [facility];  Service: Orthopedics;  Laterality: Right;   RADIOLOGY WITH ANESTHESIA N/A 04/12/2014   Procedure: ANGIOPLASTY;  Surgeon: Rob Hickman, MD;  Location: [facility];  Service: Radiology;  Laterality: N/A;   SMALL INTESTINE SURGERY     veterbral art angioplasty     x2        Home Medications    Prior to Admission medications   Medication Sig Start Date End Date Taking? Authorizing Provider  ALPRAZolam (XANAX) 0.5 MG tablet 1/2-1 tab BID as needed for anxiety Patient taking differently: Take 0.5 mg by mouth 2 (two) times a day.  03/29/18  Yes Noralee Space, MD  aspirin EC 81 MG EC tablet Take 1 tablet (81 mg total) by mouth daily. 02/28/14  Yes Ghimire, Henreitta Leber, MD  atorvastatin (LIPITOR) 80 MG tablet TAKE 1 TABLET BY MOUTH DAILY AT 6PM Patient taking differently: Take 80 mg by mouth at bedtime.  03/25/18  Yes Noralee Space, MD  cholecalciferol (VITAMIN D) 1000 units tablet Take 2,000 Units by mouth daily.   Yes [provider]  clopidogrel (PLAVIX) 75 MG tablet TAKE 1 TABLET BY MOUTH EVERY DAY Patient taking differently: Take 75 mg by mouth daily.  05/04/18  Yes Noralee Space, MD  donepezil (ARICEPT) 10 MG tablet Take 1 tablet (10 mg total) by mouth  daily. Patient taking differently: Take 10 mg by mouth at bedtime.  05/31/18  Yes Cameron Sprang, MD  glipiZIDE (GLUCOTROL XL) 5 MG 24 hr tablet Take 1 tablet (5 mg total) by mouth daily with breakfast. 05/31/18  Yes Philemon Kingdom, MD  Insulin Glargine (LANTUS SOLOSTAR) 100 UNIT/ML Solostar Pen Inject 20 Units into the skin at bedtime. 05/31/18  Yes Philemon Kingdom, MD  memantine (NAMENDA) 10 MG tablet TAKE 1 TABLET EVERY NIGHT 1 WEEK, THEN INCREASE TO 1 TABLET TWICE A DAY Patient taking differently: Take 10 mg by mouth 2 (two) times daily.  06/27/18  Yes Cameron Sprang, MD  metFORMIN (GLUCOPHAGE) 500 MG tablet Take 1 tablet (500 mg total) by mouth 2 (two) times daily with a meal. 05/31/18  Yes Philemon Kingdom, MD  pantoprazole (PROTONIX) 40 MG tablet TAKE 1 TABLET (40 MG TOTAL) BY MOUTH DAILY. Patient taking differently: Take 40 mg by mouth daily.  03/15/17  Yes Teressa Lower  M, MD  sertraline (ZOLOFT) 50 MG tablet TAKE 1 TABLET BY MOUTH EVERYDAY AT BEDTIME Patient taking differently: Take 50 mg by mouth at bedtime.  03/21/18  Yes Noralee Space, MD  Insulin Pen Needle 32G X 4 MM MISC Use 1x a day 05/31/18   Philemon Kingdom, MD  ONE [facility] ULTRA TEST test strip USE AS INSTRUCTED 12/28/16   Noralee Space, MD  saxagliptin HCl (ONGLYZA) 5 MG TABS tablet Take 1 tablet (5 mg total) by mouth daily. Patient not taking: Reported on 09/02/2018 02/02/17   Noralee Space, MD    Family History Family History  Problem Relation Age of Onset   Heart attack Father    Dementia Mother    Ovarian cancer Paternal Grandmother    Breast cancer Sister    Colon cancer Neg Hx    Esophageal cancer Neg Hx    Rectal cancer Neg Hx    Stomach cancer Neg Hx     Social History Social History   Tobacco Use   Smoking status: Never Smoker   Smokeless tobacco: Never Used  Substance Use Topics   Alcohol use: No    Alcohol/week: 0.0 standard drinks   Drug use: No     Allergies   Dilaudid  [hydromorphone hcl]; Hydromorphone hcl; and Fentanyl   Review of Systems Review of Systems  Unable to perform ROS: Acuity of condition  Constitutional: Negative for chills and fever.  HENT: Negative for congestion and facial swelling.   Eyes: Negative for discharge and visual disturbance.  Respiratory: Negative for shortness of breath.   Cardiovascular: Negative for chest pain and palpitations.  Gastrointestinal: Negative for abdominal pain, diarrhea and vomiting.  Musculoskeletal: Negative for arthralgias and myalgias.  Skin: Negative for color change and rash.  Neurological: Negative for tremors, syncope and headaches.  Psychiatric/Behavioral: Negative for confusion and dysphoric mood.     Physical Exam Updated Vital Signs BP (!) 190/147 (BP Location: Right Arm)    Pulse 65    Temp 98.3 F (36.8 C) (Oral)    SpO2 100%   Physical Exam Vitals signs and nursing note reviewed.  Constitutional:      Appearance: He is well-developed.  HENT:     Head: Normocephalic and atraumatic.  Eyes:     Pupils: Pupils are equal, round, and reactive to light.  Neck:     Musculoskeletal: Normal range of motion and neck supple.     Vascular: No JVD.  Cardiovascular:     Rate and Rhythm: Normal rate and regular rhythm.     Heart sounds: No murmur. No friction rub. No gallop.   Pulmonary:     Effort: No respiratory distress.     Breath sounds: No wheezing.  Abdominal:     General: There is no distension.     Tenderness: There is no guarding or rebound.  Musculoskeletal: Normal range of motion.  Skin:    Coloration: Skin is not pale.     Findings: No rash.  Neurological:     Mental Status: He is alert.     Comments: Right-sided facial droop.  4 out of 5 muscle strength of the right upper and right lower extremity      ED Treatments / Results  Labs (all labs ordered are listed, but only abnormal results are displayed) Labs Reviewed  CBC - Abnormal; Notable for the following  components:      Result Value   RBC 2.97 (*)    Hemoglobin 8.8 (*)  HCT 29.3 (*)    All other components within normal limits  COMPREHENSIVE METABOLIC PANEL - Abnormal; Notable for the following components:   Chloride 113 (*)    CO2 20 (*)    Glucose, Bld 197 (*)    BUN 37 (*)    Creatinine, Ser 1.64 (*)    Total Protein 6.3 (*)    Albumin 3.2 (*)    AST 13 (*)    GFR calc non Af Amer 39 (*)    GFR calc Af Amer 46 (*)    All other components within normal limits  I-STAT CHEM 8, ED - Abnormal; Notable for the following components:   Chloride 114 (*)    BUN 35 (*)    Creatinine, Ser 1.70 (*)    Glucose, Bld 194 (*)    TCO2 20 (*)    Hemoglobin 9.2 (*)    HCT 27.0 (*)    All other components within normal limits  CBG MONITORING, ED - Abnormal; Notable for the following components:   Glucose-Capillary 183 (*)    All other components within normal limits  PROTIME-INR  APTT  DIFFERENTIAL  HEMOGLOBIN A1C  LIPID PANEL  BASIC METABOLIC PANEL    EKG EKG Interpretation  Date/Time:  Friday Sep 02 2018 18:44:53 EDT Ventricular Rate:  81 PR Interval:    QRS Duration: 80 QT Interval:  360 QTC Calculation: 418 R Axis:   26 Text Interpretation:  Sinus rhythm No significant change since last tracing Confirmed by Deno Etienne [id number]) on 09/02/2018 6:56:16 PM   Radiology Ct Angio Head W Or Wo Contrast  Result Date: 09/02/2018 CLINICAL DATA:  Difficulty with speech in ambulation. Right arm and leg drift. Aphasia. EXAM: CT ANGIOGRAPHY HEAD AND NECK TECHNIQUE: Multidetector CT imaging of the head and neck was performed using the standard protocol during bolus administration of intravenous contrast. Multiplanar CT image reconstructions and MIPs were obtained to evaluate the vascular anatomy. Carotid stenosis measurements (when applicable) are obtained utilizing NASCET criteria, using the distal internal carotid diameter as the denominator. CONTRAST:  148mL OMNIPAQUE IOHEXOL 350 MG/ML  SOLN COMPARISON:  CTA head neck 02/26/2014 FINDINGS: CTA NECK FINDINGS SKELETON: There is no bony spinal canal stenosis. No lytic or blastic lesion. OTHER NECK: Normal pharynx, larynx and major salivary glands. No cervical lymphadenopathy. Unremarkable thyroid gland. UPPER CHEST: No pneumothorax or pleural effusion. No nodules or masses. AORTIC ARCH: There is mild calcific atherosclerosis of the aortic arch. There is no aneurysm, dissection or hemodynamically significant stenosis of the visualized ascending aorta and aortic arch. Conventional 3 vessel aortic branching pattern. The visualized proximal subclavian arteries are widely patent. RIGHT CAROTID SYSTEM: --Common carotid artery: Widely patent origin without common carotid artery dissection or aneurysm. --Internal carotid artery: No dissection, occlusion or aneurysm. Mild atherosclerotic calcification at the carotid bifurcation without hemodynamically significant stenosis. --External carotid artery: No acute abnormality. LEFT CAROTID SYSTEM: --Common carotid artery: Widely patent origin without common carotid artery dissection or aneurysm. --Internal carotid artery: Status post endarterectomy. There is severe narrowing of the left internal carotid artery along most of its course with complete loss of enhancement at the petrous segment, unchanged. --External carotid artery: No acute abnormality. VERTEBRAL ARTERIES: Left dominant configuration. Both origins are normal. The right vertebral artery is normal to the skull base. The V4 segment is occluded. The dominant left vertebral artery is also normal to the skull base. There is severe stenosis of the V4 segment secondary to mixed density atherosclerotic plaque. These findings are unchanged.  CTA HEAD FINDINGS POSTERIOR CIRCULATION: --Vertebral arteries: Occlusion of the right V4 segment and severe stenosis of left, unchanged. --Posterior inferior cerebellar arteries (PICA): Patent with shared origins with the  AICA. --Anterior inferior cerebellar arteries (AICA): Patent origins from the basilar artery. --Basilar artery: Normal. --Superior cerebellar arteries: Normal. --Posterior cerebral arteries (PCA): Normal. Both originate from the basilar artery. Posterior communicating arteries (p-comm) are diminutive or absent. ANTERIOR CIRCULATION: --Intracranial internal carotid arteries: Mild atherosclerotic calcification on the right. As above, the left ICA is occluded at the skull base. There is opacification of the left carotid terminus via collateral flow. --Anterior cerebral arteries (ACA): Non opacification of the left A1 segment. Patent anterior communicating artery and right A1 segment. Both distal anterior cerebral arteries are patent. --Middle cerebral arteries (MCA): Normal right MCA. Attenuated appearance of the left MCA without occlusion. VENOUS SINUSES: As permitted by contrast timing, patent. ANATOMIC VARIANTS: None Review of the MIP images confirms the above findings. IMPRESSION: 1. No emergent large vessel occlusion. 2. Unchanged appearance of left internal carotid artery, status post endarterectomy, with severe distal narrowing and occlusion at the petrous segment. 3. Unchanged occlusion of the V4 segment of the right vertebral artery and severe stenosis of the left V4 segment. 4. Attenuated enhancement within the left MCA distribution without occlusion or high-grade stenosis. 5.  Aortic atherosclerosis (ICD10-I70.0). These results were called by telephone at the time of interpretation on 09/02/2018 at 7:04 pm to Dr. Amie Portland , who verbally acknowledged these results. Electronically Signed   By: Ulyses Jarred [name]   On: 09/02/2018 19:04   Ct Angio Neck W Or Wo Contrast  Result Date: 09/02/2018 CLINICAL DATA:  Difficulty with speech in ambulation. Right arm and leg drift. Aphasia. EXAM: CT ANGIOGRAPHY HEAD AND NECK TECHNIQUE: Multidetector CT imaging of the head and neck was performed using the standard  protocol during bolus administration of intravenous contrast. Multiplanar CT image reconstructions and MIPs were obtained to evaluate the vascular anatomy. Carotid stenosis measurements (when applicable) are obtained utilizing NASCET criteria, using the distal internal carotid diameter as the denominator. CONTRAST:  110mL OMNIPAQUE IOHEXOL 350 MG/ML SOLN COMPARISON:  CTA head neck 02/26/2014 FINDINGS: CTA NECK FINDINGS SKELETON: There is no bony spinal canal stenosis. No lytic or blastic lesion. OTHER NECK: Normal pharynx, larynx and major salivary glands. No cervical lymphadenopathy. Unremarkable thyroid gland. UPPER CHEST: No pneumothorax or pleural effusion. No nodules or masses. AORTIC ARCH: There is mild calcific atherosclerosis of the aortic arch. There is no aneurysm, dissection or hemodynamically significant stenosis of the visualized ascending aorta and aortic arch. Conventional 3 vessel aortic branching pattern. The visualized proximal subclavian arteries are widely patent. RIGHT CAROTID SYSTEM: --Common carotid artery: Widely patent origin without common carotid artery dissection or aneurysm. --Internal carotid artery: No dissection, occlusion or aneurysm. Mild atherosclerotic calcification at the carotid bifurcation without hemodynamically significant stenosis. --External carotid artery: No acute abnormality. LEFT CAROTID SYSTEM: --Common carotid artery: Widely patent origin without common carotid artery dissection or aneurysm. --Internal carotid artery: Status post endarterectomy. There is severe narrowing of the left internal carotid artery along most of its course with complete loss of enhancement at the petrous segment, unchanged. --External carotid artery: No acute abnormality. VERTEBRAL ARTERIES: Left dominant configuration. Both origins are normal. The right vertebral artery is normal to the skull base. The V4 segment is occluded. The dominant left vertebral artery is also normal to the skull  base. There is severe stenosis of the V4 segment secondary to mixed density atherosclerotic  plaque. These findings are unchanged. CTA HEAD FINDINGS POSTERIOR CIRCULATION: --Vertebral arteries: Occlusion of the right V4 segment and severe stenosis of left, unchanged. --Posterior inferior cerebellar arteries (PICA): Patent with shared origins with the AICA. --Anterior inferior cerebellar arteries (AICA): Patent origins from the basilar artery. --Basilar artery: Normal. --Superior cerebellar arteries: Normal. --Posterior cerebral arteries (PCA): Normal. Both originate from the basilar artery. Posterior communicating arteries (p-comm) are diminutive or absent. ANTERIOR CIRCULATION: --Intracranial internal carotid arteries: Mild atherosclerotic calcification on the right. As above, the left ICA is occluded at the skull base. There is opacification of the left carotid terminus via collateral flow. --Anterior cerebral arteries (ACA): Non opacification of the left A1 segment. Patent anterior communicating artery and right A1 segment. Both distal anterior cerebral arteries are patent. --Middle cerebral arteries (MCA): Normal right MCA. Attenuated appearance of the left MCA without occlusion. VENOUS SINUSES: As permitted by contrast timing, patent. ANATOMIC VARIANTS: None Review of the MIP images confirms the above findings. IMPRESSION: 1. No emergent large vessel occlusion. 2. Unchanged appearance of left internal carotid artery, status post endarterectomy, with severe distal narrowing and occlusion at the petrous segment. 3. Unchanged occlusion of the V4 segment of the right vertebral artery and severe stenosis of the left V4 segment. 4. Attenuated enhancement within the left MCA distribution without occlusion or high-grade stenosis. 5.  Aortic atherosclerosis (ICD10-I70.0). These results were called by telephone at the time of interpretation on 09/02/2018 at 7:04 pm to Dr. Amie Portland , who verbally acknowledged these  results. Electronically Signed   By: Ulyses Jarred [name]   On: 09/02/2018 19:04   Mr Brain Wo Contrast  Result Date: 09/02/2018 CLINICAL DATA:  New onset aphasia.  Right facial droop. EXAM: MRI HEAD WITHOUT CONTRAST TECHNIQUE: Multiplanar, multiecho pulse sequences of the brain and surrounding structures were obtained without intravenous contrast. COMPARISON:  CTA head neck same day FINDINGS: BRAIN: There is multifocal acute ischemia, predominantly within the left MCA territory, most affecting the frontal and temporal lobes. There is a single focus of ischemia in the right cerebellum. There are old bilateral cerebellar infarcts and old left watershed territory infarcts. No midline shift or other mass effect. Multifocal white matter hyperintensity, most commonly due to chronic ischemic microangiopathy. The cerebral and cerebellar volume are age-appropriate. No hydrocephalus. Susceptibility-sensitive sequences show no chronic microhemorrhage or superficial siderosis. No mass lesion. VASCULAR: Chronic occlusion of the left internal carotid artery at the skull base SKULL AND UPPER CERVICAL SPINE: Calvarial bone marrow signal is normal. There is no skull base mass. Visualized upper cervical spine and soft tissues are normal. SINUSES/ORBITS: No fluid levels or advanced mucosal thickening. No mastoid or middle ear effusion. The orbits are normal. IMPRESSION: 1. Multifocal acute ischemia within the left MCA territory, likely embolic, in keeping with the reported speech and right facial symptoms. 2. Single small focus of acute ischemia in the right cerebellum. 3. No acute hemorrhage or mass effect. 4. Remote left watershed zone infarct and bilateral cerebellar infarcts. 5. Chronic occlusion of the left ICA. Electronically Signed   By: Ulyses Jarred [name]   On: 09/02/2018 19:52   Ct Head Code Stroke Wo Contrast  Result Date: 09/02/2018 CLINICAL DATA:  Code stroke.  Initial evaluation for acute aphasia. EXAM: CT HEAD  WITHOUT CONTRAST TECHNIQUE: Contiguous axial images were obtained from the base of the skull through the vertex without intravenous contrast. COMPARISON:  Prior CT from 06/27/2017. FINDINGS: Brain: Generalized age-related cerebral atrophy with mild chronic small vessel ischemic disease. Multiple  remote left MCA and/or watershed type territory infarcts seen involving the left cerebral hemisphere, stable. Scattered chronic bilateral cerebellar infarcts noted as well, also unchanged. No acute intracranial hemorrhage. No acute large vessel territory infarct. No mass lesion, midline shift or mass effect. No hydrocephalus. No extra-axial fluid collection. Vascular: No hyperdense vessel. Prominent atherosclerosis at the skull base. Skull: Scalp soft tissues demonstrate no acute finding. Calvarium intact. Sinuses/Orbits: Globes and orbital soft tissues within normal limits. Scattered chronic mucosal thickening within the ethmoidal air cells and maxillary sinuses. Paranasal sinuses are otherwise clear. No mastoid effusion. Other: None. ASPECTS [facility] Stroke Program Early CT Score) - Ganglionic level infarction (caudate, lentiform nuclei, internal capsule, insula, M1-M3 cortex): 7 - Supraganglionic infarction (M4-M6 cortex): 3 Total score (0-10 with 10 being normal): 10 IMPRESSION: 1. No acute intracranial infarct or other abnormality identified. 2. ASPECTS is 10. 3. Chronic left MCA and/or watershed type infarcts involving the left cerebral hemisphere, with additional chronic bilateral cerebellar infarcts, stable. 4. Underlying age-related cerebral atrophy with mild chronic small vessel ischemic disease. These results were communicated to Dr. Rory Percy at [place] 5/22/2020by text page via the [facility] messaging system. Electronically Signed   By: Jeannine Boga [name]   On: 09/02/2018 18:44    Procedures Procedures (including critical care time)  Medications Ordered in ED Medications  sodium chloride flush (NS) 0.9  % injection 3 mL (has no administration in time range)  aspirin EC tablet 81 mg (has no administration in time range)  clopidogrel (PLAVIX) tablet 75 mg (has no administration in time range)   stroke: mapping our early stages of recovery book (has no administration in time range)  acetaminophen (TYLENOL) tablet 650 mg (has no administration in time range)    Or  acetaminophen (TYLENOL) solution 650 mg (has no administration in time range)    Or  acetaminophen (TYLENOL) suppository 650 mg (has no administration in time range)  senna-docusate (Senokot-S) tablet 1 tablet (has no administration in time range)  enoxaparin (LOVENOX) injection 40 mg (has no administration in time range)  0.9 %  sodium chloride infusion (has no administration in time range)  insulin aspart (novoLOG) injection 0-9 Units (has no administration in time range)  insulin aspart (novoLOG) injection 0-5 Units (has no administration in time range)  atorvastatin (LIPITOR) tablet 80 mg (has no administration in time range)  iohexol (OMNIPAQUE) 350 MG/ML injection 100 mL (100 mLs Intravenous Contrast Given 09/02/18 1839)     Initial Impression / Assessment and Plan / ED Course  I have reviewed the triage vital signs and the nursing notes.  Pertinent labs & imaging results that were available during my care of the patient were reviewed by me and considered in my medical decision making (see chart for details).        78 yo M with a chief complaint of strokelike symptoms.  Going on for the past couple days.  Worsening today and sent by EMS.  Made a code stroke in triage and then called off by the neurologist.  Discussed the case personally with Dr. Malen Gauze he recommended medical admission for full stroke work-up.   The patients results and plan were reviewed and discussed.   Any x-rays performed were independently reviewed by myself.   Differential diagnosis were considered with the presenting HPI.  Medications  sodium  chloride flush (NS) 0.9 % injection 3 mL (has no administration in time range)  aspirin EC tablet 81 mg (has no administration in time range)  clopidogrel (PLAVIX)  tablet 75 mg (has no administration in time range)   stroke: mapping our early stages of recovery book (has no administration in time range)  acetaminophen (TYLENOL) tablet 650 mg (has no administration in time range)    Or  acetaminophen (TYLENOL) solution 650 mg (has no administration in time range)    Or  acetaminophen (TYLENOL) suppository 650 mg (has no administration in time range)  senna-docusate (Senokot-S) tablet 1 tablet (has no administration in time range)  enoxaparin (LOVENOX) injection 40 mg (has no administration in time range)  0.9 %  sodium chloride infusion (has no administration in time range)  insulin aspart (novoLOG) injection 0-9 Units (has no administration in time range)  insulin aspart (novoLOG) injection 0-5 Units (has no administration in time range)  atorvastatin (LIPITOR) tablet 80 mg (has no administration in time range)  iohexol (OMNIPAQUE) 350 MG/ML injection 100 mL (100 mLs Intravenous Contrast Given 09/02/18 1839)    Vitals:   09/02/18 1819  BP: (!) 190/147  Pulse: 65  Temp: 98.3 F (36.8 C)  TempSrc: Oral  SpO2: 100%    Final diagnoses:  Neurologic deficit due to acute ischemic stroke [facility])    Admission/ observation were discussed with the admitting physician, patient and/or family and they are comfortable with the plan.   Final Clinical Impressions(s) / ED Diagnoses   Final diagnoses:  Neurologic deficit due to acute ischemic stroke [facility])    ED Discharge Orders    None       Deno Etienne, DO 09/02/18 2007

## 2018-09-02 NOTE — ED Notes (Addendum)
Activated code stroke per Charge.

## 2018-09-02 NOTE — Progress Notes (Signed)
EEG Complete  Results Pending 

## 2018-09-02 NOTE — ED Triage Notes (Signed)
Pt arrived [facility] for stroke like symptoms, per family speech difficulties and trouble walking started on Tuesday but significantly worse starting at 3pm, R arm and leg drift present on arrival, pt has severe aphasia.  18 RAC BP 152/80 P 76 [id number]  [id number]

## 2018-09-02 NOTE — Telephone Encounter (Signed)
Wife left VM that she had spoken with you earlier and said that the patient is not doing very good. Please call her back at [phone]. Thanks!

## 2018-09-02 NOTE — H&P (Signed)
History and Physical    BIFF RUTIGLIANO LKG:[id number] DOB: 1940-07-01 DOA: 09/02/2018  PCP: Imagene Riches, NP Patient coming from: Home  Chief Complaint: Aphasia, right facial droop  HPI: Peter Nicholson is a 78 y.o. male with medical history significant of CVA, left ICA stenosis, right ICA stenosis, dementia, seizures, hypertension, hyperlipidemia, type 2 diabetes presenting to the hospital via EMS for evaluation of aphasia and right facial droop.  Noted by family to have difficulty walking and right facial droop and slurred speech.  Last known normal on 08/30/2018.  Patient saw his outpatient neurologist on telemetry medicine consultation yesterday and his right-sided facial droop was felt to be new.  Today he was brought in by EMS because he had significant deterioration in his speech and his words were garbled.  On aspirin and Plavix at home.  Outside TPA window.  Seen by neurology.  Patient is awake and alert.  Resting comfortably and no signs of distress.  Oriented to person and place.  He is not able to tell me why he is at the hospital today.  He has no complaints.  No additional history could be obtained from him.  ED Course: Blood pressure 190/147 on arrival.  No leukocytosis.  Hemoglobin 8.8, baseline in the 9-11 range.  INR 1.0.  Blood glucose 197.  Creatinine 1.6, close to baseline.  COVID-19 rapid test negative.  Head CT negative for acute finding.  CT angiogram head and neck showing no emergent large vessel occlusion. Unchanged appearance of left internal carotid artery, status post endarterectomy, with severe distal narrowing and occlusion at the petrous segment.    Review of Systems:  All systems reviewed and apart from history of presenting illness, are negative.  Past Medical History:  Diagnosis Date   Anxiety state, unspecified    Blood transfusion without reported diagnosis    Bowel obstruction ([place])    Calculus of kidney    Cerebrovascular disease, unspecified     Diaphragmatic hernia without mention of obstruction or gangrene    Diverticulosis of colon (without mention of hemorrhage)    Esophageal reflux    Gout, unspecified    History of gallstones    History of seizures    HTN (hypertension)    IBS (irritable bowel syndrome)    Kidney carcinoma (HCC)    Lumbago    Obstructive sleep apnea (adult) (pediatric)    doesnt wear CPAP   Osteoarthrosis, unspecified whether generalized or localized, unspecified site    tendonitis   Other specified congenital anomaly of kidney    Overweight(278.02)    Pure hypercholesterolemia    Seizures ([place])    last seizure November 2012   Stroke [facility])    2001   Type II or unspecified type diabetes mellitus without mention of complication, not stated as uncontrolled    Unspecified venous (peripheral) insufficiency     Past Surgical History:  Procedure Laterality Date   CAROTID ENDARTERECTOMY  09/2000   Dr.Lawson   CHOLECYSTECTOMY  9/04   Dr. Rebekah Chesterfield   EXTERNAL FIXATION LEG  03/10/2011   Procedure: EXTERNAL FIXATION LEG;  Surgeon: Rozanna Box;  Location: [place];  Service: Orthopedics;  Laterality: Right;   INCISIONAL HERNIA REPAIR  1/96   Dr.Leone   NASAL SINUS SURGERY  12/96   Dr.Redman   NEPHRECTOMY  1994   renal cell cancer in horseshoe kidney; right   ORIF TIBIA PLATEAU  03/24/2011   Procedure: OPEN REDUCTION INTERNAL FIXATION (ORIF) TIBIAL PLATEAU;  Surgeon: Legrand Como  H Handy;  Location: [place];  Service: Orthopedics;  Laterality: Right;   RADIOLOGY WITH ANESTHESIA N/A 04/12/2014   Procedure: ANGIOPLASTY;  Surgeon: Rob Hickman, MD;  Location: [place];  Service: Radiology;  Laterality: N/A;   SMALL INTESTINE SURGERY     veterbral art angioplasty     x2     reports that he has never smoked. He has never used smokeless tobacco. He reports that he does not drink alcohol or use drugs.  Allergies  Allergen Reactions   Dilaudid [Hydromorphone Hcl] Other (See Comments)     hallucinations   Hydromorphone Hcl Other (See Comments)    hallucinations   Fentanyl Other (See Comments)    hallucinations    Family History  Problem Relation Age of Onset   Heart attack Father    Dementia Mother    Ovarian cancer Paternal Grandmother    Breast cancer Sister    Colon cancer Neg Hx    Esophageal cancer Neg Hx    Rectal cancer Neg Hx    Stomach cancer Neg Hx     Prior to Admission medications   Medication Sig Start Date End Date Taking? Authorizing Provider  ALPRAZolam (XANAX) 0.5 MG tablet 1/2-1 tab BID as needed for anxiety Patient taking differently: Take 0.5 mg by mouth 2 (two) times a day.  03/29/18  Yes Noralee Space, MD  aspirin EC 81 MG EC tablet Take 1 tablet (81 mg total) by mouth daily. 02/28/14  Yes Ghimire, Henreitta Leber, MD  atorvastatin (LIPITOR) 80 MG tablet TAKE 1 TABLET BY MOUTH DAILY AT 6PM Patient taking differently: Take 80 mg by mouth at bedtime.  03/25/18  Yes Noralee Space, MD  cholecalciferol (VITAMIN D) 1000 units tablet Take 2,000 Units by mouth daily.   Yes [provider]  clopidogrel (PLAVIX) 75 MG tablet TAKE 1 TABLET BY MOUTH EVERY DAY Patient taking differently: Take 75 mg by mouth daily.  05/04/18  Yes Noralee Space, MD  donepezil (ARICEPT) 10 MG tablet Take 1 tablet (10 mg total) by mouth daily. Patient taking differently: Take 10 mg by mouth at bedtime.  05/31/18  Yes Cameron Sprang, MD  glipiZIDE (GLUCOTROL XL) 5 MG 24 hr tablet Take 1 tablet (5 mg total) by mouth daily with breakfast. 05/31/18  Yes Philemon Kingdom, MD  Insulin Glargine (LANTUS SOLOSTAR) 100 UNIT/ML Solostar Pen Inject 20 Units into the skin at bedtime. 05/31/18  Yes Philemon Kingdom, MD  memantine (NAMENDA) 10 MG tablet TAKE 1 TABLET EVERY NIGHT 1 WEEK, THEN INCREASE TO 1 TABLET TWICE A DAY Patient taking differently: Take 10 mg by mouth 2 (two) times daily.  06/27/18  Yes Cameron Sprang, MD  metFORMIN (GLUCOPHAGE) 500 MG tablet Take 1  tablet (500 mg total) by mouth 2 (two) times daily with a meal. 05/31/18  Yes Philemon Kingdom, MD  pantoprazole (PROTONIX) 40 MG tablet TAKE 1 TABLET (40 MG TOTAL) BY MOUTH DAILY. Patient taking differently: Take 40 mg by mouth daily.  03/15/17  Yes Noralee Space, MD  sertraline (ZOLOFT) 50 MG tablet TAKE 1 TABLET BY MOUTH EVERYDAY AT BEDTIME Patient taking differently: Take 50 mg by mouth at bedtime.  03/21/18  Yes Noralee Space, MD  Insulin Pen Needle 32G X 4 MM MISC Use 1x a day 05/31/18   Philemon Kingdom, MD  ONE [facility] ULTRA TEST test strip USE AS INSTRUCTED 12/28/16   Noralee Space, MD  saxagliptin HCl (ONGLYZA) 5 MG TABS tablet  Take 1 tablet (5 mg total) by mouth daily. Patient not taking: Reported on 09/02/2018 02/02/17   Noralee Space, MD    Physical Exam: Vitals:   09/02/18 1819 09/02/18 2035 09/02/18 2131  BP: (!) 190/147 (!) 204/70 (!) 211/63  Pulse: 65 83 81  Resp:  14 15  Temp: 98.3 F (36.8 C)  98.3 F (36.8 C)  TempSrc: Oral  Oral  SpO2: 100% 100% 97%  Weight:   73.9 kg  Height:   5\' 8"  (1.727 m)    Physical Exam  Constitutional: He appears well-developed and well-nourished. No distress.  Resting comfortably in a stretcher  HENT:  Head: Normocephalic.  Mouth/Throat: Oropharynx is clear and moist.  Eyes: Pupils are equal, round, and reactive to light. EOM are normal. Right eye exhibits no discharge. Left eye exhibits no discharge.  Neck: Neck supple.  Cardiovascular: Normal rate, regular rhythm and intact distal pulses.  Pulmonary/Chest: Effort normal and breath sounds normal. No respiratory distress. He has no wheezes. He has no rales.  Abdominal: Soft. Bowel sounds are normal. He exhibits no distension. There is no abdominal tenderness. There is no guarding.  Musculoskeletal:        General: No edema.  Neurological: He is alert. No cranial nerve deficit.  Oriented to person and place. No facial droop, tongue midline. Strength 5 out of 5 in bilateral upper  and lower extremities. Sensation to light touch intact throughout.  Skin: Skin is warm and dry. He is not diaphoretic.     Labs on Admission: I have personally reviewed following labs and imaging studies  CBC: Recent Labs  Lab 09/02/18 1815 09/02/18 1821  WBC 7.1  --   NEUTROABS 5.4  --   HGB 8.8* 9.2*  HCT 29.3* 27.0*  MCV 98.7  --   PLT 217  --    Basic Metabolic Panel: Recent Labs  Lab 09/02/18 1815 09/02/18 1821  NA 142 141  K 5.0 4.9  CL 113* 114*  CO2 20*  --   GLUCOSE 197* 194*  BUN 37* 35*  CREATININE 1.64* 1.70*  CALCIUM 9.6  --    GFR: Estimated Creatinine Clearance: 34.6 mL/min (A) (by C-G formula based on SCr of 1.7 mg/dL (H)). Liver Function Tests: Recent Labs  Lab 09/02/18 1815  AST 13*  ALT 14  ALKPHOS 78  BILITOT 0.4  PROT 6.3*  ALBUMIN 3.2*   No results for input(s): LIPASE, AMYLASE in the last 168 hours. No results for input(s): AMMONIA in the last 168 hours. Coagulation Profile: Recent Labs  Lab 09/02/18 1815  INR 1.0   Cardiac Enzymes: No results for input(s): CKTOTAL, CKMB, CKMBINDEX, TROPONINI in the last 168 hours. BNP (last 3 results) No results for input(s): PROBNP in the last 8760 hours. HbA1C: No results for input(s): HGBA1C in the last 72 hours. CBG: Recent Labs  Lab 09/02/18 1816 09/02/18 2140  GLUCAP 183* 103*   Lipid Profile: No results for input(s): CHOL, HDL, LDLCALC, TRIG, CHOLHDL, LDLDIRECT in the last 72 hours. Thyroid Function Tests: No results for input(s): TSH, T4TOTAL, FREET4, T3FREE, THYROIDAB in the last 72 hours. Anemia Panel: No results for input(s): VITAMINB12, FOLATE, FERRITIN, TIBC, IRON, RETICCTPCT in the last 72 hours. Urine analysis:    Component Value Date/Time   COLORURINE YELLOW 06/27/2017 0156   APPEARANCEUR CLEAR 06/27/2017 0156   LABSPEC 1.011 06/27/2017 0156   PHURINE 7.0 06/27/2017 0156   GLUCOSEU NEGATIVE 06/27/2017 0156   GLUCOSEU >=1000 01/17/2013 1550   HGBUR  NEGATIVE  06/27/2017 0156   BILIRUBINUR NEGATIVE 06/27/2017 0156   KETONESUR NEGATIVE 06/27/2017 0156   PROTEINUR 100 (A) 06/27/2017 0156   UROBILINOGEN 0.2 04/08/2014 2234   NITRITE NEGATIVE 06/27/2017 0156   LEUKOCYTESUR NEGATIVE 06/27/2017 0156    Radiological Exams on Admission: Ct Angio Head W Or Nicholson Contrast  Result Date: 09/02/2018 CLINICAL DATA:  Difficulty with speech in ambulation. Right arm and leg drift. Aphasia. EXAM: CT ANGIOGRAPHY HEAD AND NECK TECHNIQUE: Multidetector CT imaging of the head and neck was performed using the standard protocol during bolus administration of intravenous contrast. Multiplanar CT image reconstructions and MIPs were obtained to evaluate the vascular anatomy. Carotid stenosis measurements (when applicable) are obtained utilizing NASCET criteria, using the distal internal carotid diameter as the denominator. CONTRAST:  116mL OMNIPAQUE IOHEXOL 350 MG/ML SOLN COMPARISON:  CTA head neck 02/26/2014 FINDINGS: CTA NECK FINDINGS SKELETON: There is no bony spinal canal stenosis. No lytic or blastic lesion. OTHER NECK: Normal pharynx, larynx and major salivary glands. No cervical lymphadenopathy. Unremarkable thyroid gland. UPPER CHEST: No pneumothorax or pleural effusion. No nodules or masses. AORTIC ARCH: There is mild calcific atherosclerosis of the aortic arch. There is no aneurysm, dissection or hemodynamically significant stenosis of the visualized ascending aorta and aortic arch. Conventional 3 vessel aortic branching pattern. The visualized proximal subclavian arteries are widely patent. RIGHT CAROTID SYSTEM: --Common carotid artery: Widely patent origin without common carotid artery dissection or aneurysm. --Internal carotid artery: No dissection, occlusion or aneurysm. Mild atherosclerotic calcification at the carotid bifurcation without hemodynamically significant stenosis. --External carotid artery: No acute abnormality. LEFT CAROTID SYSTEM: --Common carotid artery:  Widely patent origin without common carotid artery dissection or aneurysm. --Internal carotid artery: Status post endarterectomy. There is severe narrowing of the left internal carotid artery along most of its course with complete loss of enhancement at the petrous segment, unchanged. --External carotid artery: No acute abnormality. VERTEBRAL ARTERIES: Left dominant configuration. Both origins are normal. The right vertebral artery is normal to the skull base. The V4 segment is occluded. The dominant left vertebral artery is also normal to the skull base. There is severe stenosis of the V4 segment secondary to mixed density atherosclerotic plaque. These findings are unchanged. CTA HEAD FINDINGS POSTERIOR CIRCULATION: --Vertebral arteries: Occlusion of the right V4 segment and severe stenosis of left, unchanged. --Posterior inferior cerebellar arteries (PICA): Patent with shared origins with the AICA. --Anterior inferior cerebellar arteries (AICA): Patent origins from the basilar artery. --Basilar artery: Normal. --Superior cerebellar arteries: Normal. --Posterior cerebral arteries (PCA): Normal. Both originate from the basilar artery. Posterior communicating arteries (p-comm) are diminutive or absent. ANTERIOR CIRCULATION: --Intracranial internal carotid arteries: Mild atherosclerotic calcification on the right. As above, the left ICA is occluded at the skull base. There is opacification of the left carotid terminus via collateral flow. --Anterior cerebral arteries (ACA): Non opacification of the left A1 segment. Patent anterior communicating artery and right A1 segment. Both distal anterior cerebral arteries are patent. --Middle cerebral arteries (MCA): Normal right MCA. Attenuated appearance of the left MCA without occlusion. VENOUS SINUSES: As permitted by contrast timing, patent. ANATOMIC VARIANTS: None Review of the MIP images confirms the above findings. IMPRESSION: 1. No emergent large vessel occlusion. 2.  Unchanged appearance of left internal carotid artery, status post endarterectomy, with severe distal narrowing and occlusion at the petrous segment. 3. Unchanged occlusion of the V4 segment of the right vertebral artery and severe stenosis of the left V4 segment. 4. Attenuated enhancement within the left MCA distribution  without occlusion or high-grade stenosis. 5.  Aortic atherosclerosis (ICD10-I70.0). These results were called by telephone at the time of interpretation on 09/02/2018 at 7:04 pm to Dr. Amie Portland , who verbally acknowledged these results. Electronically Signed   By: Ulyses Jarred [name]   On: 09/02/2018 19:04   Ct Angio Neck W Or Nicholson Contrast  Result Date: 09/02/2018 CLINICAL DATA:  Difficulty with speech in ambulation. Right arm and leg drift. Aphasia. EXAM: CT ANGIOGRAPHY HEAD AND NECK TECHNIQUE: Multidetector CT imaging of the head and neck was performed using the standard protocol during bolus administration of intravenous contrast. Multiplanar CT image reconstructions and MIPs were obtained to evaluate the vascular anatomy. Carotid stenosis measurements (when applicable) are obtained utilizing NASCET criteria, using the distal internal carotid diameter as the denominator. CONTRAST:  141mL OMNIPAQUE IOHEXOL 350 MG/ML SOLN COMPARISON:  CTA head neck 02/26/2014 FINDINGS: CTA NECK FINDINGS SKELETON: There is no bony spinal canal stenosis. No lytic or blastic lesion. OTHER NECK: Normal pharynx, larynx and major salivary glands. No cervical lymphadenopathy. Unremarkable thyroid gland. UPPER CHEST: No pneumothorax or pleural effusion. No nodules or masses. AORTIC ARCH: There is mild calcific atherosclerosis of the aortic arch. There is no aneurysm, dissection or hemodynamically significant stenosis of the visualized ascending aorta and aortic arch. Conventional 3 vessel aortic branching pattern. The visualized proximal subclavian arteries are widely patent. RIGHT CAROTID SYSTEM: --Common carotid  artery: Widely patent origin without common carotid artery dissection or aneurysm. --Internal carotid artery: No dissection, occlusion or aneurysm. Mild atherosclerotic calcification at the carotid bifurcation without hemodynamically significant stenosis. --External carotid artery: No acute abnormality. LEFT CAROTID SYSTEM: --Common carotid artery: Widely patent origin without common carotid artery dissection or aneurysm. --Internal carotid artery: Status post endarterectomy. There is severe narrowing of the left internal carotid artery along most of its course with complete loss of enhancement at the petrous segment, unchanged. --External carotid artery: No acute abnormality. VERTEBRAL ARTERIES: Left dominant configuration. Both origins are normal. The right vertebral artery is normal to the skull base. The V4 segment is occluded. The dominant left vertebral artery is also normal to the skull base. There is severe stenosis of the V4 segment secondary to mixed density atherosclerotic plaque. These findings are unchanged. CTA HEAD FINDINGS POSTERIOR CIRCULATION: --Vertebral arteries: Occlusion of the right V4 segment and severe stenosis of left, unchanged. --Posterior inferior cerebellar arteries (PICA): Patent with shared origins with the AICA. --Anterior inferior cerebellar arteries (AICA): Patent origins from the basilar artery. --Basilar artery: Normal. --Superior cerebellar arteries: Normal. --Posterior cerebral arteries (PCA): Normal. Both originate from the basilar artery. Posterior communicating arteries (p-comm) are diminutive or absent. ANTERIOR CIRCULATION: --Intracranial internal carotid arteries: Mild atherosclerotic calcification on the right. As above, the left ICA is occluded at the skull base. There is opacification of the left carotid terminus via collateral flow. --Anterior cerebral arteries (ACA): Non opacification of the left A1 segment. Patent anterior communicating artery and right A1 segment.  Both distal anterior cerebral arteries are patent. --Middle cerebral arteries (MCA): Normal right MCA. Attenuated appearance of the left MCA without occlusion. VENOUS SINUSES: As permitted by contrast timing, patent. ANATOMIC VARIANTS: None Review of the MIP images confirms the above findings. IMPRESSION: 1. No emergent large vessel occlusion. 2. Unchanged appearance of left internal carotid artery, status post endarterectomy, with severe distal narrowing and occlusion at the petrous segment. 3. Unchanged occlusion of the V4 segment of the right vertebral artery and severe stenosis of the left V4 segment. 4. Attenuated enhancement within  the left MCA distribution without occlusion or high-grade stenosis. 5.  Aortic atherosclerosis (ICD10-I70.0). These results were called by telephone at the time of interpretation on 09/02/2018 at 7:04 pm to Dr. Amie Portland , who verbally acknowledged these results. Electronically Signed   By: Ulyses Jarred [name]   On: 09/02/2018 19:04   Peter Nicholson Contrast  Result Date: 09/02/2018 CLINICAL DATA:  New onset aphasia.  Right facial droop. EXAM: MRI HEAD WITHOUT CONTRAST TECHNIQUE: Multiplanar, multiecho pulse sequences of the brain and surrounding structures were obtained without intravenous contrast. COMPARISON:  CTA head neck same day FINDINGS: BRAIN: There is multifocal acute ischemia, predominantly within the left MCA territory, most affecting the frontal and temporal lobes. There is a single focus of ischemia in the right cerebellum. There are old bilateral cerebellar infarcts and old left watershed territory infarcts. No midline shift or other mass effect. Multifocal white matter hyperintensity, most commonly due to chronic ischemic microangiopathy. The cerebral and cerebellar volume are age-appropriate. No hydrocephalus. Susceptibility-sensitive sequences show no chronic microhemorrhage or superficial siderosis. No mass lesion. VASCULAR: Chronic occlusion of the left  internal carotid artery at the skull base SKULL AND UPPER CERVICAL SPINE: Calvarial bone marrow signal is normal. There is no skull base mass. Visualized upper cervical spine and soft tissues are normal. SINUSES/ORBITS: No fluid levels or advanced mucosal thickening. No mastoid or middle ear effusion. The orbits are normal. IMPRESSION: 1. Multifocal acute ischemia within the left MCA territory, likely embolic, in keeping with the reported speech and right facial symptoms. 2. Single small focus of acute ischemia in the right cerebellum. 3. No acute hemorrhage or mass effect. 4. Remote left watershed zone infarct and bilateral cerebellar infarcts. 5. Chronic occlusion of the left ICA. Electronically Signed   By: Ulyses Jarred [name]   On: 09/02/2018 19:52   Ct Head Code Stroke Nicholson Contrast  Result Date: 09/02/2018 CLINICAL DATA:  Code stroke.  Initial evaluation for acute aphasia. EXAM: CT HEAD WITHOUT CONTRAST TECHNIQUE: Contiguous axial images were obtained from the base of the skull through the vertex without intravenous contrast. COMPARISON:  Prior CT from 06/27/2017. FINDINGS: Brain: Generalized age-related cerebral atrophy with mild chronic small vessel ischemic disease. Multiple remote left MCA and/or watershed type territory infarcts seen involving the left cerebral hemisphere, stable. Scattered chronic bilateral cerebellar infarcts noted as well, also unchanged. No acute intracranial hemorrhage. No acute large vessel territory infarct. No mass lesion, midline shift or mass effect. No hydrocephalus. No extra-axial fluid collection. Vascular: No hyperdense vessel. Prominent atherosclerosis at the skull base. Skull: Scalp soft tissues demonstrate no acute finding. Calvarium intact. Sinuses/Orbits: Globes and orbital soft tissues within normal limits. Scattered chronic mucosal thickening within the ethmoidal air cells and maxillary sinuses. Paranasal sinuses are otherwise clear. No mastoid effusion. Other: None.  ASPECTS [facility] Stroke Program Early CT Score) - Ganglionic level infarction (caudate, lentiform nuclei, internal capsule, insula, M1-M3 cortex): 7 - Supraganglionic infarction (M4-M6 cortex): 3 Total score (0-10 with 10 being normal): 10 IMPRESSION: 1. No acute intracranial infarct or other abnormality identified. 2. ASPECTS is 10. 3. Chronic left MCA and/or watershed type infarcts involving the left cerebral hemisphere, with additional chronic bilateral cerebellar infarcts, stable. 4. Underlying age-related cerebral atrophy with mild chronic small vessel ischemic disease. These results were communicated to Dr. Rory Percy at [place] 5/22/2020by text page via the [facility] messaging system. Electronically Signed   By: Jeannine Boga [name]   On: 09/02/2018 18:44    EKG: Independently reviewed.  Normal  sinus rhythm (heart rate 81).  Assessment/Plan Principal Problem:   Acute CVA (cerebrovascular accident) ([place]) Active Problems:   Anxiety   Essential hypertension   HLD (hyperlipidemia)   CKD (chronic kidney disease) stage 3, GFR 30-59 ml/min (HCC)   Type 2 diabetes mellitus with stage 3 chronic kidney disease, with long-term current use of insulin (HCC)   Acute CVA, suspect embolic 3-day history of neuro deficits including slurred speech, difficulty speaking, right-sided weakness and right facial droop with significantly worsened with garbled speech this evening around 3 PM.  Last known normal on 08/30/2018.  Not a candidate for TPA.  Brain MRI showing multifocal acute ischemia within the left MCA territory, likely embolic.  Also showing single small focus of acute ischemia in the right cerebellum.  No acute hemorrhage or mass-effect.  CT angiogram head and neck showing no emergent large vessel occlusion. Unchanged appearance of left internal carotid artery, status post endarterectomy, with severe distal narrowing and occlusion at the petrous segment.  Seen by neurology and admitted for stroke  work-up. -Telemetry monitoring -Allow for permissive hypertension for the first 24-48h - only treat PRN if SBP >220 mmHg. Blood pressures can be gradually normalized to SBP<140 upon discharge. -Echocardiogram -HgbA1c, fasting lipid panel -Frequent neuro checks -Prophylactic therapy- DAPT (Aspirin and Plavix) -Atorvastatin 80 mg PO daily -Risk factor modification -PT consult, OT consult, Speech consult -Routine EEG  CKD 3 -Creatinine 1.6, close to baseline.   Hypertension -Allow permissive hypertension in the setting of acute CVA -Hydralazine PRN SBP greater than 220  Hyperlipidemia -Continue Lipitor  Type 2 diabetes -Check A1c.  Continue home Lantus 20 units at bedtime.  Sliding scale insulin and CBG checks.  Anxiety -Continue home Xanax 0.5 mg twice daily  Dementia -Continue home donezepil, memantine  GERD -Continue PPI  Depression -Continue Zoloft  DVT prophylaxis: Lovenox Code Status: Full code Family Communication: No family available at this time. Disposition Plan: Anticipate discharge in 1 to 2 days. Consults called: None  Admission status: It is my clinical opinion that referral for OBSERVATION is reasonable and necessary in this patient based on the above information provided. The aforementioned taken together are felt to place the patient at high risk for further clinical deterioration. However it is anticipated that the patient may be medically stable for discharge from the hospital within 24 to 48 hours.  The medical decision making on this patient was of high complexity and the patient is at high risk for clinical deterioration, therefore this is a level 3 visit.  Shela Leff MD [facility] Pager [phone]  If 7PM-7AM, please contact night-coverage [email or web address] Password [facility]  09/02/2018, 10:58 PM

## 2018-09-02 NOTE — Consult Note (Addendum)
Neurology Consultation  Reason for Consult: Code stroke Referring Physician: Dr Tyrone Nine  CC: Aphasia right facial droop  History is obtained from: Chart, wife over the phone  HPI: Peter Nicholson is a 78 y.o. male past medical history of stroke, left ICA stenosis, right ICA stenosis, dementia, noted by family to have difficulty walking and right facial droop and slurred speech for the past last known normal on Tuesday5/19/2020. Saw his outpatient neurologist on telemedicine consultation yesterday, who felt that the right facial droop is new.  Today brought in by EMS because he has significant deterioration in his speech and his words are garbled. I have verified this history with the wife as well as Dr. Delice Lesch as noted in the chart from yesterday.  Of note, patient also has a history of seizures. For stroke prevention, on aspirin and Plavix at home.  On arrival CBG 183. Code stroke was canceled because the last seen normal is way beyond that for TPA or acute intervention.  LKW: Tuesday, 08/30/2018 at some time tpa given?: no, outside the window Premorbid modified Rankin scale (mRS): 2  ROS: Patient unable to provide due to altered mental status.  Past Medical History:  Diagnosis Date  . Anxiety state, unspecified   . Blood transfusion without reported diagnosis   . Bowel obstruction ([place])   . Calculus of kidney   . Cerebrovascular disease, unspecified   . Diaphragmatic hernia without mention of obstruction or gangrene   . Diverticulosis of colon (without mention of hemorrhage)   . Esophageal reflux   . Gout, unspecified   . History of gallstones   . History of seizures   . HTN (hypertension)   . IBS (irritable bowel syndrome)   . Kidney carcinoma ([place])   . Lumbago   . Obstructive sleep apnea (adult) (pediatric)    doesnt wear CPAP  . Osteoarthrosis, unspecified whether generalized or localized, unspecified site    tendonitis  . Other specified congenital anomaly of kidney   .  Overweight(278.02)   . Pure hypercholesterolemia   . Seizures ([place])    last seizure November 2012  . Stroke ([place])    2001  . Type II or unspecified type diabetes mellitus without mention of complication, not stated as uncontrolled   . Unspecified venous (peripheral) insufficiency    Family History  Problem Relation Age of Onset  . Heart attack Father   . Dementia Mother   . Ovarian cancer Paternal Grandmother   . Breast cancer Sister   . Colon cancer Neg Hx   . Esophageal cancer Neg Hx   . Rectal cancer Neg Hx   . Stomach cancer Neg Hx    Social History:   reports that he has never smoked. He has never used smokeless tobacco. He reports that he does not drink alcohol or use drugs.  Medications  Current Facility-Administered Medications:  .  sodium chloride flush (NS) 0.9 % injection 3 mL, 3 mL, Intravenous, Once, Deno Etienne, DO  Current Outpatient Medications:  .  ALPRAZolam (XANAX) 0.5 MG tablet, 1/2-1 tab BID as needed for anxiety, Disp: 60 tablet, Rfl: 5 .  aspirin EC 81 MG EC tablet, Take 1 tablet (81 mg total) by mouth daily., Disp: 30 tablet, Rfl: 0 .  atorvastatin (LIPITOR) 80 MG tablet, TAKE 1 TABLET BY MOUTH DAILY AT 6PM, Disp: 90 tablet, Rfl: 3 .  cholecalciferol (VITAMIN D) 1000 units tablet, Take 2,000 Units by mouth daily., Disp: , Rfl:  .  clopidogrel (PLAVIX) 75 MG  tablet, TAKE 1 TABLET BY MOUTH EVERY DAY, Disp: 90 tablet, Rfl: 1 .  donepezil (ARICEPT) 10 MG tablet, Take 1 tablet (10 mg total) by mouth daily., Disp: 90 tablet, Rfl: 3 .  glipiZIDE (GLUCOTROL XL) 5 MG 24 hr tablet, Take 1 tablet (5 mg total) by mouth daily with breakfast., Disp: 90 tablet, Rfl: 3 .  Insulin Glargine (LANTUS SOLOSTAR) 100 UNIT/ML Solostar Pen, Inject 20 Units into the skin at bedtime., Disp: 10 pen, Rfl: 3 .  Insulin Pen Needle 32G X 4 MM MISC, Use 1x a day, Disp: 100 each, Rfl: 3 .  memantine (NAMENDA) 10 MG tablet, TAKE 1 TABLET EVERY NIGHT 1 WEEK, THEN INCREASE TO 1 TABLET TWICE A  DAY, Disp: 180 tablet, Rfl: 4 .  metFORMIN (GLUCOPHAGE) 500 MG tablet, Take 1 tablet (500 mg total) by mouth 2 (two) times daily with a meal., Disp: 180 tablet, Rfl: 3 .  ONE TOUCH ULTRA TEST test strip, USE AS INSTRUCTED, Disp: 100 each, Rfl: 2 .  pantoprazole (PROTONIX) 40 MG tablet, TAKE 1 TABLET (40 MG TOTAL) BY MOUTH DAILY., Disp: 90 tablet, Rfl: 0 .  saxagliptin HCl (ONGLYZA) 5 MG TABS tablet, Take 1 tablet (5 mg total) by mouth daily., Disp: 15 tablet, Rfl: 0 .  sertraline (ZOLOFT) 50 MG tablet, TAKE 1 TABLET BY MOUTH EVERYDAY AT BEDTIME, Disp: 90 tablet, Rfl: 2  Exam: Current vital signs: BP (!) 190/147 (BP Location: Right Arm)   Pulse 65   Temp 98.3 F (36.8 C) (Oral)   SpO2 100%  Vital signs in last 24 hours: Temp:  [98.3 F (36.8 C)] 98.3 F (36.8 C) (05/22 1819) Pulse Rate:  [65] 65 (05/22 1819) BP: (190)/(147) 190/147 (05/22 1819) SpO2:  [100 %] 100 % (05/22 1819) General: Awake alert in no distress HEENT: Normocephalic atraumatic Lungs: Clear to auscultation CVS: S1-S2 heard regular rhythm Abdomen: Soft nondistended nontender Extremities warm well perfused with intact pulses Neurological exam He is awake alert. He is severely dysarthric. Speech is extremely garbled-he has both receptive and expressive aphasia.  Not following commands consistently but able to mimic some. Cranial nerves: Pupils equal round react light, extraocular movements appear intact, does not blink to threat from the right blinks to threat from the left, right lower facial weakness is observed, tongue midline, palate midline. Motor exam no vertical drift in all fours Sensory exam: Intact light touch Coordination: Difficult to assess due to inability to follow commands. NIH stroke scale 1a Level of Conscious.: 0 1b LOC Questions: 2 1c LOC Commands: 1 2 Best Gaze: 0 3 Visual: 1 4 Facial Palsy: 1 5a Motor Arm - left: 0 5b Motor Arm - Right: 0 6a Motor Leg - Left: 0 6b Motor Leg - Right: 0 7  Limb Ataxia: 0 8 Sensory: 0 9 Best Language: 2 10 Dysarthria: 1 11 Extinct. and Inatten.:0  TOTAL: 8  Labs I have reviewed labs in epic and the results pertinent to this consultation are:  CBC    Component Value Date/Time   WBC 7.1 09/02/2018 1815   RBC 2.97 (L) 09/02/2018 1815   HGB 9.2 (L) 09/02/2018 1821   HCT 27.0 (L) 09/02/2018 1821   PLT 217 09/02/2018 1815   MCV 98.7 09/02/2018 1815   MCH 29.6 09/02/2018 1815   MCHC 30.0 09/02/2018 1815   RDW 12.5 09/02/2018 1815   LYMPHSABS 0.8 09/02/2018 1815   MONOABS 0.6 09/02/2018 1815   EOSABS 0.3 09/02/2018 1815   BASOSABS 0.1 09/02/2018 1815  CMP     Component Value Date/Time   NA 141 09/02/2018 1821   K 4.9 09/02/2018 1821   CL 114 (H) 09/02/2018 1821   CO2 21 03/23/2018 1444   GLUCOSE 194 (H) 09/02/2018 1821   BUN 35 (H) 09/02/2018 1821   CREATININE 1.70 (H) 09/02/2018 1821   CALCIUM 9.1 03/23/2018 1444   CALCIUM 8.2 (L) 03/11/2011 0547   PROT 6.1 03/23/2018 1444   ALBUMIN 3.8 03/23/2018 1444   AST 21 03/23/2018 1444   ALT 18 03/23/2018 1444   ALKPHOS 67 03/23/2018 1444   BILITOT 0.3 03/23/2018 1444   GFRNONAA 42 (L) 06/27/2017 0156   GFRAA 48 (L) 06/27/2017 0156   Imaging I have reviewed the images obtained: Noncontrast CT of the head shows no acute changes.  Multiple chronic strokes including watershed strokes of the left hemisphere and multiple left cortical infarcts and age-related atrophy along with chronic small vessel disease.   CTA of the head and neck pending  Assessment: 78 year old man wit left and right ICA stenosis, left MCA stroke, dementia, hypertension, diabetes, presenting for 3 days worth of neurological symptoms of slurred speech, difficulty speaking, right-sided weakness and right facial droop which significantly worsened with garbled speech this evening around 3 PM. Last known normal according to wife is somewhere on Tuesday, 08/30/2018. Not a candidate for IV TPA or intervention because of  the last known normal being 3 days ago. Will benefit from stroke work-up and further evaluation of the stenoses that are known in his carotids.  Impression: Evaluate for acute ischemic stroke Low suspicion for seizure but EEG evaluation would be worthwhile  Recommendations: -Admit to hospitalist -Telemetry monitoring -Allow for permissive hypertension for the first 24-48h - only treat PRN if SBP >220 mmHg. Blood pressures can be gradually normalized to SBP<140 upon discharge. -Echocardiogram -HgbA1c, fasting lipid panel -Frequent neuro checks -Prophylactic therapy- DAPT -Atorvastatin 80 mg PO daily -Risk factor modification -PT consult, OT consult, Speech consult -Routine EEG  Relayed my recommendations to Dr. Deno Etienne in the ER. Stroke neurology will continue to follow with you. Please call with questions.   Code stroke was canceled because of symptoms being present for greater than 24 hours.  Please page stroke NP/PA/MD (listed on AMION)  from 8am-4 pm as this patient will be followed by the stroke team at this point.   -- Amie Portland, MD [facility] Pager: [phone] If 7pm to 7am, please call on call as listed on AMION.  ADDENDUM CTA head and neck: reviewed with neurorads (preliminarily with Dr. Collins [place] over phone - formal read pending)-- no emergent LVO. Mostly unchanged from prior with severe anterior and posterior circ stenosis.  Recs as above.  -- Amie Portland, MD [facility] Pager: [phone] If 7pm to 7am, please call on call as listed on AMION.

## 2018-09-02 NOTE — ED Notes (Signed)
CANCELED CODE STROKE

## 2018-09-03 ENCOUNTER — Inpatient Hospital Stay (HOSPITAL_COMMUNITY): Payer: Medicare Other

## 2018-09-03 DIAGNOSIS — Z794 Long term (current) use of insulin: Secondary | ICD-10-CM | POA: Diagnosis not present

## 2018-09-03 DIAGNOSIS — I6523 Occlusion and stenosis of bilateral carotid arteries: Secondary | ICD-10-CM | POA: Diagnosis present

## 2018-09-03 DIAGNOSIS — K589 Irritable bowel syndrome without diarrhea: Secondary | ICD-10-CM | POA: Diagnosis present

## 2018-09-03 DIAGNOSIS — Z8673 Personal history of transient ischemic attack (TIA), and cerebral infarction without residual deficits: Secondary | ICD-10-CM | POA: Diagnosis not present

## 2018-09-03 DIAGNOSIS — R29818 Other symptoms and signs involving the nervous system: Secondary | ICD-10-CM | POA: Diagnosis not present

## 2018-09-03 DIAGNOSIS — R29708 NIHSS score 8: Secondary | ICD-10-CM | POA: Diagnosis present

## 2018-09-03 DIAGNOSIS — I639 Cerebral infarction, unspecified: Secondary | ICD-10-CM | POA: Diagnosis not present

## 2018-09-03 DIAGNOSIS — Z1159 Encounter for screening for other viral diseases: Secondary | ICD-10-CM | POA: Diagnosis not present

## 2018-09-03 DIAGNOSIS — Z8249 Family history of ischemic heart disease and other diseases of the circulatory system: Secondary | ICD-10-CM | POA: Diagnosis not present

## 2018-09-03 DIAGNOSIS — E785 Hyperlipidemia, unspecified: Secondary | ICD-10-CM | POA: Diagnosis present

## 2018-09-03 DIAGNOSIS — Z803 Family history of malignant neoplasm of breast: Secondary | ICD-10-CM | POA: Diagnosis not present

## 2018-09-03 DIAGNOSIS — I10 Essential (primary) hypertension: Secondary | ICD-10-CM | POA: Diagnosis not present

## 2018-09-03 DIAGNOSIS — K219 Gastro-esophageal reflux disease without esophagitis: Secondary | ICD-10-CM | POA: Diagnosis present

## 2018-09-03 DIAGNOSIS — F329 Major depressive disorder, single episode, unspecified: Secondary | ICD-10-CM | POA: Diagnosis present

## 2018-09-03 DIAGNOSIS — Z8041 Family history of malignant neoplasm of ovary: Secondary | ICD-10-CM | POA: Diagnosis not present

## 2018-09-03 DIAGNOSIS — Z7902 Long term (current) use of antithrombotics/antiplatelets: Secondary | ICD-10-CM | POA: Diagnosis not present

## 2018-09-03 DIAGNOSIS — I63412 Cerebral infarction due to embolism of left middle cerebral artery: Secondary | ICD-10-CM | POA: Diagnosis present

## 2018-09-03 DIAGNOSIS — Z79899 Other long term (current) drug therapy: Secondary | ICD-10-CM | POA: Diagnosis not present

## 2018-09-03 DIAGNOSIS — E1122 Type 2 diabetes mellitus with diabetic chronic kidney disease: Secondary | ICD-10-CM | POA: Diagnosis present

## 2018-09-03 DIAGNOSIS — Z7982 Long term (current) use of aspirin: Secondary | ICD-10-CM | POA: Diagnosis not present

## 2018-09-03 DIAGNOSIS — M109 Gout, unspecified: Secondary | ICD-10-CM | POA: Diagnosis present

## 2018-09-03 DIAGNOSIS — R29810 Facial weakness: Secondary | ICD-10-CM | POA: Diagnosis present

## 2018-09-03 DIAGNOSIS — R4701 Aphasia: Secondary | ICD-10-CM | POA: Diagnosis present

## 2018-09-03 DIAGNOSIS — N183 Chronic kidney disease, stage 3 (moderate): Secondary | ICD-10-CM | POA: Diagnosis present

## 2018-09-03 DIAGNOSIS — F0390 Unspecified dementia without behavioral disturbance: Secondary | ICD-10-CM | POA: Diagnosis present

## 2018-09-03 DIAGNOSIS — I129 Hypertensive chronic kidney disease with stage 1 through stage 4 chronic kidney disease, or unspecified chronic kidney disease: Secondary | ICD-10-CM | POA: Diagnosis present

## 2018-09-03 DIAGNOSIS — R569 Unspecified convulsions: Secondary | ICD-10-CM | POA: Diagnosis present

## 2018-09-03 LAB — BASIC METABOLIC PANEL
Anion gap: 8 (ref 5–15)
BUN: 33 mg/dL — ABNORMAL HIGH (ref 8–23)
CO2: 20 mmol/L — ABNORMAL LOW (ref 22–32)
Calcium: 9.4 mg/dL (ref 8.9–10.3)
Chloride: 114 mmol/L — ABNORMAL HIGH (ref 98–111)
Creatinine, Ser: 1.61 mg/dL — ABNORMAL HIGH (ref 0.61–1.24)
GFR calc Af Amer: 47 mL/min — ABNORMAL LOW (ref >60)
GFR calc non Af Amer: 40 mL/min — ABNORMAL LOW (ref >60)
Glucose, Bld: 120 mg/dL — ABNORMAL HIGH (ref 70–99)
Potassium: 4.7 mmol/L (ref 3.5–5.1)
Sodium: 142 mmol/L (ref 135–145)

## 2018-09-03 LAB — GLUCOSE, CAPILLARY
Glucose-Capillary: 96 mg/dL (ref 70–99)
Glucose-Capillary: 148 mg/dL — ABNORMAL HIGH (ref 70–99)
Glucose-Capillary: 132 mg/dL — ABNORMAL HIGH (ref 70–99)
Glucose-Capillary: 121 mg/dL — ABNORMAL HIGH (ref 70–99)

## 2018-09-03 LAB — LIPID PANEL
Cholesterol: 135 mg/dL (ref 0–200)
HDL: 55 mg/dL (ref >40)
LDL Cholesterol: 69 mg/dL (ref 0–99)
Total CHOL/HDL Ratio: 2.5 RATIO
Triglycerides: 55 mg/dL (ref <150)
VLDL: 11 mg/dL (ref 0–40)

## 2018-09-03 LAB — ECHOCARDIOGRAM COMPLETE
Height: 68 in
Weight: 2606.72 oz

## 2018-09-03 LAB — HEMOGLOBIN A1C
Hgb A1c MFr Bld: 7.1 % — ABNORMAL HIGH (ref 4.8–5.6)
Mean Plasma Glucose: 157.07 mg/dL

## 2018-09-03 NOTE — Progress Notes (Signed)
*  PRELIMINARY RESULTS* Echocardiogram 2D Echocardiogram has been performed.  Peter Nicholson 09/03/2018, 5:09 PM

## 2018-09-03 NOTE — Progress Notes (Signed)
PROGRESS NOTE  Peter Nicholson [id number] DOB: 1940-07-08 DOA: 09/02/2018 PCP: Imagene Riches, NP   LOS: 0 days   Brief narrative: Patient is a 78 y.o. male past medical history of stroke, seizure, left ICA stenosis, right ICA stenosis, dementia.  Family noted patient to have difficulty walking and right facial droop and slurred speech for the last few days.  5/21 -patient was seen by his outpatient neurologist on telemedicine consultation, who felt that the right facial droop was new.  5/22-family noted significant deterioration in his speech and his words were garbled.  EMS brought to the ED Patient was admitted for evaluation TIA/stroke. tPA was not administered because he was outside the window.  Subjective: Patient was seen and examined this morning.  Pleasant elderly Caucasian male.  Lying down in bed.  Not in distress.  Depressed look.  Able to answer simple questions.  Assessment/Plan:  Principal Problem:   Acute CVA (cerebrovascular accident) ([place]) Active Problems:   Anxiety   Essential hypertension   HLD (hyperlipidemia)   CKD (chronic kidney disease) stage 3, GFR 30-59 ml/min (HCC)   Type 2 diabetes mellitus with stage 3 chronic kidney disease, with long-term current use of insulin ([place])   Neurological deficit present  Stroke:  Multiple bilateral infarcts  - embolic - likely from known left Internal Carotid Artery occlusion, bilateral V4 severe stenosis or occlusion/multivessel atherosclerosis and stenosis.  - Resultant  Global aphasia and dysarthria, moving right side less than left, facial droop - CTA H&N - severe stenosis of the left ICA, right V4 occlusion,  and left V4 segment severe stenosis - MRI head - Multifocal acute ischemia within the left MCA territory, likely embolic due to chronic occlusion of the left ICA.  Single small focus of acute ischemia in the right cerebellum likely due to v4 stenosis.  -Echocardiogram pending -EEG -pending secondary to stroke.   Not suggestive of seizure or seizure predisposition. -LDL 69, hemoglobin 7.1 -Prior to admission, patient was on aspirin 81 mg daily and Plavix 75 mg daily.  Neurology recommends to continue the same. -PT eval obtained.  Recommend SNF versus 24-hour supervision at home.  Hypertension -Prior to admission, patient was not on any blood pressure medicines at home. -On arrival, blood pressure was elevated up to 211/63.  Permissive hypertension for 24 to 48 hours. -If continues to stay high, will start oral blood pressure medicines.  Hyperlipidemia -Continue Lipitor 80 mg daily at home.  Diabetes -A1c 7.1.  Target less than 8 for age.  -Prior to admission, patient was on Onglyza, metformin glipizide and insulin Lantus 20 units at bedtime. -Currently on Lantus.  Oral meds on hold. -I think I would aim less strict blood sugar control on this elderly gentleman.   CKD 3 -Creatinine 1.6, close to baseline.   Anxiety -Continue home Xanax 0.5 mg twice daily  Dementia -Continue home donezepil, memantine  GERD -Continue PPI  Depression -Continue Zoloft  Mobility: Encourage ambulation Diet: Cardiac diet DVT prophylaxis:  Lovenox Code Status:   Code Status: Full Code  Family Communication:  Expected Discharge:  Anticipate discharge home tomorrow.  Consultants:  Neurology  Procedures:    Antimicrobials:  Anti-infectives (From admission, onward)   None      Infusions:    Scheduled Meds: . ALPRAZolam  0.5 mg Oral BID  . aspirin EC  81 mg Oral Daily  . atorvastatin  80 mg Oral q1800  . cholecalciferol  2,000 Units Oral Daily  . clopidogrel  75 mg  Oral Daily  . donepezil  10 mg Oral QHS  . enoxaparin (LOVENOX) injection  40 mg Subcutaneous Q24H  . insulin aspart  0-9 Units Subcutaneous TID WC  . insulin glargine  20 Units Subcutaneous QHS  . memantine  10 mg Oral BID  . pantoprazole  40 mg Oral Daily  . sertraline  50 mg Oral QHS  . sodium chloride flush  3 mL  Intravenous Once    PRN meds: acetaminophen **OR** acetaminophen (TYLENOL) oral liquid 160 mg/5 mL **OR** acetaminophen, hydrALAZINE, senna-docusate   Objective: Vitals:   09/03/18 0800 09/03/18 1158  BP: (!) 178/60 (!) 157/61  Pulse:  72  Resp:  18  Temp:  98.6 F (37 C)  SpO2:  97%    Intake/Output Summary (Last 24 hours) at 09/03/2018 1540 Last data filed at 09/03/2018 5520 Gross per 24 hour  Intake 120 ml  Output 0 ml  Net 120 ml   Filed Weights   09/02/18 2131  Weight: 73.9 kg   Weight change:  Body mass index is 24.77 kg/m.   Physical Exam: General exam: Pleasant elderly Caucasian male.  Not in distress Skin: No rashes, lesions or ulcers. HEENT: Atraumatic, normocephalic, supple neck, no obvious bleeding Lungs: Clear to auscultate bilaterally CVS: Regular rate and rhythm, no murmur GI/Abd soft, nontender, nondistended, bowel sound present CNS: Alert, awake, oriented to place and person, not to time Psychiatry: Depressed mood Extremities: No pedal edema, no calf tenderness  Data Review: I have personally reviewed the laboratory data and studies available.  Recent Labs  Lab 09/02/18 1815 09/02/18 1821  WBC 7.1  --   NEUTROABS 5.4  --   HGB 8.8* 9.2*  HCT 29.3* 27.0*  MCV 98.7  --   PLT 217  --    Recent Labs  Lab 09/02/18 1815 09/02/18 1821 09/03/18 0451  NA 142 141 142  K 5.0 4.9 4.7  CL 113* 114* 114*  CO2 20*  --  20*  GLUCOSE 197* 194* 120*  BUN 37* 35* 33*  CREATININE 1.64* 1.70* 1.61*  CALCIUM 9.6  --  9.4   Lipid Panel     Component Value Date/Time   CHOL 135 09/03/2018 0451   TRIG 55 09/03/2018 0451   HDL 55 09/03/2018 0451   CHOLHDL 2.5 09/03/2018 0451   VLDL 11 09/03/2018 0451   LDLCALC 69 09/03/2018 0451   LDLDIRECT 149.1 01/15/2010 1021   Lab Results  Component Value Date   HGBA1C 7.1 (H) 09/03/2018   Terrilee Croak, MD  [facility] 09/03/2018

## 2018-09-03 NOTE — Evaluation (Signed)
Occupational Therapy Evaluation Patient Details Name: Peter Nicholson MRN: [id number] DOB: 11-25-1940 Today's Date: 09/03/2018    History of Present Illness Peter Nicholson is a 78 y.o. male with medical history significant of CVA, left ICA stenosis, right ICA stenosis, dementia, seizures, hypertension, hyperlipidemia, type 2 diabetes presenting to the hospital via EMS for evaluation of aphasia and right facial droop. MRI revealing: multifocal acute ischemia, frontal/temporal ischemia and chronic occlusion of L ICA.   Clinical Impression   Pt PTA: unable to determine due to pt unreliable source due to dementia and family did not answer phone call for PLOF. Currently, Pt performing ADL functional mobility and functional transfers with minguard to minA today for safety. ADL UB with minguardA and minA for LB ADL. Pt with fair balance with RW and fair stability at sink with bilateral integration. Pt appeared to have general weakness, but only symptoms were impulsivity. No focal deficits identified at this time. Pt would benefit form continued OT skilled services for ADL, mobility and safety in SNF vs [place] setting. Pt would be safe to gome home with 24/7 and have [place] follow-up. OT to follow acutely.        Follow Up Recommendations  SNF;Home health OT;Supervision/Assistance - 24 hour(unsure of home situation, but with supervision could go home)    Equipment Recommendations  None recommended by OT    Recommendations for Other Services       Precautions / Restrictions Precautions Precautions: Fall Precaution Comments: dementia at baseline Restrictions Weight Bearing Restrictions: No      Mobility Bed Mobility Overal bed mobility: Needs Assistance Bed Mobility: Sidelying to Sit;Sit to Supine   Sidelying to sit: Supervision   Sit to supine: Supervision   General bed mobility comments: supervisionA for safety  Transfers Overall transfer level: Needs assistance Equipment used: Rolling  walker (2 wheeled) Transfers: Sit to/from [facility] Sit to Stand: Min guard;Min assist Stand pivot transfers: Min guard;Min assist       General transfer comment: minguardA to minA overall    Balance Overall balance assessment: Needs assistance   Sitting balance-Leahy Scale: Fair       Standing balance-Leahy Scale: Fair                             ADL either performed or assessed with clinical judgement   ADL Overall ADL's : Needs assistance/impaired Eating/Feeding: Supervision/ safety;Sitting   Grooming: Wash/dry hands;Wash/dry face;Oral care;Applying deodorant;Brushing hair;Min guard;Sitting   Upper Body Bathing: Min guard;Sitting   Lower Body Bathing: Minimal assistance;Sitting/lateral leans;Sit to/from stand   Upper Body Dressing : Min guard;Sitting   Lower Body Dressing: Minimal assistance;Sitting/lateral leans;Sit to/from stand   Toilet Transfer: Stand-pivot;Comfort height toilet;Grab bars;Minimal assistance   Toileting- Clothing Manipulation and Hygiene: Minimal assistance;Sitting/lateral lean;Sit to/from stand       Functional mobility during ADLs: Min guard;Rolling walker General ADL Comments: Pt performing toilet hygiene with minA. Pt attempting to perform on his own. Pt performing light grooming at sink with minguardA.      Vision Baseline Vision/History: Wears glasses Wears Glasses: At all times Vision Assessment?: No apparent visual deficits     Perception     Praxis      Pertinent Vitals/Pain Pain Assessment: No/denies pain     Hand Dominance Right   Extremity/Trunk Assessment Upper Extremity Assessment Upper Extremity Assessment: Generalized weakness   Lower Extremity Assessment Lower Extremity Assessment: Generalized weakness   Cervical / Trunk Assessment  Cervical / Trunk Assessment: Normal   Communication Communication Communication: Expressive difficulties   Cognition Arousal/Alertness:  Awake/alert Behavior During Therapy: Impulsive Overall Cognitive Status: History of cognitive impairments - at baseline                                 General Comments: Pt follows commands, Pt impulsive, but also had to use bathroom and was unable to relay info without OT asking pt questions   General Comments  No weakness noted specifically, but pt requiring minguardA for safety as pt is impulsive/     Exercises     Shoulder Instructions      Home Living Family/patient expects to be discharged to:: Private residence                                 Additional Comments: Pt unable to provide details. Pt's family did not answer the phone and their answering machines were not set-up.      Prior Functioning/Environment          Comments: unknown        OT Problem List: Decreased activity tolerance;Impaired balance (sitting and/or standing);Decreased safety awareness;Decreased cognition      OT Treatment/Interventions: Self-care/ADL training;Therapeutic exercise;Energy conservation;Neuromuscular education;Therapeutic activities;Patient/family education;Balance training    OT Goals(Current goals can be found in the care plan section) Acute Rehab OT Goals Patient Stated Goal: to go home OT Goal Formulation: With patient Time For Goal Achievement: 09/17/18 Potential to Achieve Goals: Good ADL Goals Pt Will Perform Grooming: with modified independence;standing Pt Will Perform Upper Body Dressing: with supervision;standing Pt Will Perform Lower Body Dressing: with supervision;sit to/from stand Additional ADL Goal #1: Pt will perform ADL tasks x5 mins at sink with supervisionA Additional ADL Goal #2: Pt will perform ADL functional transfers with supervisionA and good safety awareness  OT Frequency: Min 2X/week   Barriers to D/C: Other (comment)  unknown caregiver support       Co-evaluation              AM-PAC OT "6 Clicks" Daily Activity      Outcome Measure Help from another person eating meals?: None Help from another person taking care of personal grooming?: A Little Help from another person toileting, which includes using toliet, bedpan, or urinal?: A Little Help from another person bathing (including washing, rinsing, drying)?: A Little Help from another person to put on and taking off regular upper body clothing?: None Help from another person to put on and taking off regular lower body clothing?: A Little 6 Click Score: 20   End of Session Equipment Utilized During Treatment: Gait belt;Rolling walker Nurse Communication: Mobility status  Activity Tolerance: Patient tolerated treatment well Patient left: in bed;with call bell/phone within reach;with bed alarm set  OT Visit Diagnosis: Unsteadiness on feet (R26.81);Muscle weakness (generalized) (M62.81)                Time: 4580-9983 OT Time Calculation (min): 33 min Charges:  OT General Charges $OT Visit: 1 Visit OT Evaluation $OT Eval Moderate Complexity: 1 Mod OT Treatments $Self Care/Home Management : 8-22 mins  Ebony Hail Harold Hedge) Marsa Aris OTR/L Acute Rehabilitation Services Pager: [phone] Office: [place] 09/03/2018, 11:23 AM

## 2018-09-03 NOTE — Evaluation (Signed)
Physical Therapy Evaluation Patient Details Name: Peter Nicholson MRN: [id number] DOB: [birth year] Today's Date: 09/03/2018   History of Present Illness  Pt is a 78 y.o. male with PMH significant of CVA, left ICA stenosis, right ICA stenosis, dementia, seizures, hypertension, hyperlipidemia, type 2 diabetes presenting to the hospital via EMS for evaluation of aphasia and right facial droop. MRI revealing: multifocal acute ischemia, frontal/temporal ischemia and chronic occlusion of L ICA.  Clinical Impression  Pt admitted with above diagnosis. Pt currently with functional limitations due to the deficits listed below (see PT Problem List). At the time of PT eval pt was able to perform transfers and ambulation with gross min guard assist to supervision for safety. Pt unable to provide a history during session, so unsure of living situation. If pt will have 24 hour supervision at home, feel he could return home with HHPT to follow up. If pt does not have supervision available, SNF is the next most appropriate discharge disposition at this time. Acutely, pt will benefit from skilled PT to increase their independence and safety with mobility to allow discharge to the venue listed below.     Follow Up Recommendations SNF;Supervision/Assistance - 24 hour    Equipment Recommendations  Rolling walker with 5" wheels    Recommendations for Other Services       Precautions / Restrictions Precautions Precautions: Fall Precaution Comments: Incontinent Restrictions Weight Bearing Restrictions: No      Mobility  Bed Mobility Overal bed mobility: Needs Assistance Bed Mobility: Supine to Sit           General bed mobility comments: supervision for safety. VC's required to scoot fully around to EOB and get feet on the floor.   Transfers Overall transfer level: Needs assistance Equipment used: Rolling walker (2 wheeled) Transfers: Sit to/from [facility] Sit to Stand: Min guard;Min  assist         General transfer comment: VC's for hand placement on seated surface for safety. Initially stand from bed required min assist, and stand from [facility] min guard assist.   Ambulation/Gait Ambulation/Gait assistance: Min guard Gait Distance (Feet): 250 Feet Assistive device: Rolling walker (2 wheeled) Gait Pattern/deviations: Step-through pattern;Decreased stride length;Trunk flexed Gait velocity: Decreased Gait velocity interpretation: <1.8 ft/sec, indicate of risk for recurrent falls General Gait Details: VC's for improved posture and closer walker proximity. No overt LOB throughout gait training, but cognition limited challenges.   Stairs            Wheelchair Mobility    Modified Rankin (Stroke Patients Only) Modified Rankin (Stroke Patients Only) Pre-Morbid Rankin Score: Moderate disability Modified Rankin: Moderately severe disability     Balance Overall balance assessment: Needs assistance   Sitting balance-Leahy Scale: Fair       Standing balance-Leahy Scale: Fair                               Pertinent Vitals/Pain Pain Assessment: No/denies pain    Home Living Family/patient expects to be discharged to:: Private residence                 Additional Comments: Pt unable to provide details. Pt's family did not answer the phone and their answering machines were not set-up.    Prior Function           Comments: unknown     Hand Dominance   Dominant Hand: Right    Extremity/Trunk Assessment  Upper Extremity Assessment Upper Extremity Assessment: Generalized weakness    Lower Extremity Assessment Lower Extremity Assessment: Generalized weakness(Unable to complete MMT 2 cognition)    Cervical / Trunk Assessment Cervical / Trunk Assessment: Other exceptions Cervical / Trunk Exceptions: Forward head posture with rounded shoulders  Communication   Communication: Expressive difficulties  Cognition  Arousal/Alertness: Awake/alert Behavior During Therapy: Impulsive Overall Cognitive Status: History of cognitive impairments - at baseline                                 General Comments: Pt follows commands, Pt impulsive at times      General Comments      Exercises     Assessment/Plan    PT Assessment Patient needs continued PT services  PT Problem List Decreased strength;Decreased activity tolerance;Decreased balance;Decreased mobility;Decreased cognition;Decreased knowledge of use of DME;Decreased safety awareness;Decreased knowledge of precautions       PT Treatment Interventions DME instruction;Gait training;Functional mobility training;Therapeutic exercise;Therapeutic activities;Balance training;Neuromuscular re-education;Cognitive remediation;Patient/family education    PT Goals (Current goals can be found in the Care Plan section)  Acute Rehab PT Goals Patient Stated Goal: None stated PT Goal Formulation: Patient unable to participate in goal setting Time For Goal Achievement: 09/17/18 Potential to Achieve Goals: Good    Frequency Min 4X/week   Barriers to discharge   Unsure of living situation    Co-evaluation               AM-PAC PT "6 Clicks" Mobility  Outcome Measure Help needed turning from your back to your side while in a flat bed without using bedrails?: None Help needed moving from lying on your back to sitting on the side of a flat bed without using bedrails?: A Little Help needed moving to and from a bed to a chair (including a wheelchair)?: A Little Help needed standing up from a chair using your arms (e.g., wheelchair or bedside chair)?: A Little Help needed to walk in hospital room?: A Little Help needed climbing 3-5 steps with a railing? : A Lot 6 Click Score: 18    End of Session Equipment Utilized During Treatment: Gait belt Activity Tolerance: Patient tolerated treatment well Patient left: in chair;with call bell/phone  within reach;with chair alarm set Nurse Communication: Mobility status PT Visit Diagnosis: Unsteadiness on feet (R26.81);Other symptoms and signs involving the nervous system (R29.898)    Time: 5573-2202 PT Time Calculation (min) (ACUTE ONLY): 35 min   Charges:   PT Evaluation $PT Eval Moderate Complexity: 1 Mod PT Treatments $Gait Training: 8-22 mins        Peter Nicholson, PT, DPT Acute Rehabilitation Services Pager: [phone] Office: [phone]   Peter Nicholson 09/03/2018, 3:05 PM

## 2018-09-03 NOTE — Progress Notes (Signed)
STROKE TEAM PROGRESS NOTE   HISTORY OF PRESENT ILLNESS (per record) Peter Nicholson is a 78 y.o. male past medical history of stroke, left ICA stenosis, right ICA stenosis, dementia, noted by family to have difficulty walking and right facial droop and slurred speech for the past last known normal on Tuesday5/19/2020. Saw his outpatient neurologist on telemedicine consultation yesterday, who felt that the right facial droop is new.  Today brought in by EMS because he has significant deterioration in his speech and his words are garbled. I have verified this history with the wife as well as Dr. Delice Lesch as noted in the chart from yesterday.  Of note, patient also has a history of seizures. For stroke prevention, on aspirin and Plavix at home.  On arrival CBG 183. Code stroke was canceled because the last seen normal is way beyond that for TPA or acute intervention.  LKW: Tuesday, 08/30/2018 at some time tpa given?: no, outside the window Premorbid modified Rankin scale (mRS): 2   SUBJECTIVE (INTERVAL HISTORY) His family I snot at bedside. He is is NAD. Appears to have baseline dementia, only oriented to self. Says he is "fine".    OBJECTIVE Vitals:   09/03/18 0637 09/03/18 0757 09/03/18 0800 09/03/18 1158  BP: (!) 191/72 (!) 189/71 (!) 178/60 (!) 157/61  Pulse: 68 74  72  Resp: 18 16  18   Temp: 98.3 F (36.8 C) 98.1 F (36.7 C)  98.6 F (37 C)  TempSrc: Oral Axillary  Oral  SpO2: 97% 97%  97%  Weight:      Height:        CBC:  Recent Labs  Lab 09/02/18 1815 09/02/18 1821  WBC 7.1  --   NEUTROABS 5.4  --   HGB 8.8* 9.2*  HCT 29.3* 27.0*  MCV 98.7  --   PLT 217  --     Basic Metabolic Panel:  Recent Labs  Lab 09/02/18 1815 09/02/18 1821 09/03/18 0451  NA 142 141 142  K 5.0 4.9 4.7  CL 113* 114* 114*  CO2 20*  --  20*  GLUCOSE 197* 194* 120*  BUN 37* 35* 33*  CREATININE 1.64* 1.70* 1.61*  CALCIUM 9.6  --  9.4    Lipid Panel:     Component Value Date/Time    CHOL 135 09/03/2018 0451   TRIG 55 09/03/2018 0451   HDL 55 09/03/2018 0451   CHOLHDL 2.5 09/03/2018 0451   VLDL 11 09/03/2018 0451   LDLCALC 69 09/03/2018 0451   HgbA1c:  Lab Results  Component Value Date   HGBA1C 7.1 (H) 09/03/2018   Urine Drug Screen:     Component Value Date/Time   LABOPIA NONE DETECTED 06/27/2017 0214   COCAINSCRNUR NONE DETECTED 06/27/2017 0214   LABBENZ NONE DETECTED 06/27/2017 0214   AMPHETMU NONE DETECTED 06/27/2017 0214   THCU NONE DETECTED 06/27/2017 0214   LABBARB NONE DETECTED 06/27/2017 0214    Alcohol Level     Component Value Date/Time   ETH <10 06/27/2017 0221    IMAGING  Ct Angio Head W Or Wo Contrast Ct Angio Neck W Or Wo Contrast 09/02/2018 IMPRESSION:  1. No emergent large vessel occlusion.  2. Unchanged appearance of left internal carotid artery, status post endarterectomy, with severe distal narrowing and occlusion at the petrous segment.  3. Unchanged occlusion of the V4 segment of the right vertebral artery and severe stenosis of the left V4 segment.  4. Attenuated enhancement within the left MCA distribution without occlusion  or high-grade stenosis.  5. Aortic atherosclerosis (ICD10-I70.0).     Mr Brain Wo Contrast 09/02/2018 IMPRESSION:  1. Multifocal acute ischemia within the left MCA territory, likely embolic, in keeping with the reported speech and right facial symptoms.  2. Single small focus of acute ischemia in the right cerebellum.  3. No acute hemorrhage or mass effect.  4. Remote left watershed zone infarct and bilateral cerebellar infarcts.  5. Chronic occlusion of the left ICA.    Ct Head Code Stroke Wo Contrast 09/02/2018 IMPRESSION:  1. No acute intracranial infarct or other abnormality identified.  2. ASPECTS is 10.  3. Chronic left MCA and/or watershed type infarcts involving the left cerebral hemisphere, with additional chronic bilateral cerebellar infarcts, stable.  4. Underlying age-related cerebral  atrophy with mild chronic small vessel ischemic disease.   Transthoracic Echocardiogram  00/00/2020 Pending   EKG - SR rate 81 BPM. (See cardiology reading for complete details)   EEG  EEG Abnormalities: 1) Focal left frontotemporal slow activity with wide field Clinical Interpretation: This EEG recorded evidence of left hemispheric dysfunction that would be consistent with the patients known stroke. There was no seizure or seizure predisposition recorded on this study. Please note that lack of epileptiform activity on EEG does not preclude the possibility of epilepsy.   PHYSICAL EXAM Blood pressure (!) 157/61, pulse 72, temperature 98.6 F (37 C), temperature source Oral, resp. rate 18, height 5\' 8"  (1.727 m), weight 73.9 kg, SpO2 97 %.    Physical exam: Exam: Gen: NAD                   CV: RRR, no MRG.  No peripheral edema, warm, nontender Eyes: Conjunctivae clear without exudates or hemorrhage  Neuro: Detailed Neurologic Exam  Speech:    Speech is dysarthric and aphasic Cognition:     Can follow minimal commands with visual cues but difficult to assess due to global aphasia    Cranial Nerves:    The pupils are equal, round, and reactive to light. Does not blink to threat on the right. Extraocular movements are intact.  Right lower facial weakness. The palate elevates in the midline. Hearing appears intact to voice. Voice is normal. Shoulder shrug is normal. The tongue has normal motion without fasciculations.   Coordination:    Difficult to assess, patient aphasic and cannot follow commands  Gait:    deferred  Motor Observation: no involuntary movements noted.    Strength:    Difficult to assess due to aphasia, appears to be moving his right side less than the left.      Sensation: intact to LT     Reflex Exam:  DTR's:    Deep tendon reflexes in the upper and lower extremities are normal bilaterally.   Toes:    The toes are downgoing bilaterally.    Clonus:    Clonus is absent.      ASSESSMENT/PLAN [name] is a 78 y.o. male with history of stroke, DM, Hld, Htn, OSA, seizure hx, left ICA stenosis, right ICA stenosis, ASPVD, dementia, presenting with difficulty walking and right facial droop and slurred speech. He did not receive IV t-PA due to late presentation (>4.5 hours from time of onset)  Stroke:  Multiple bilateral infarcts - embolic - likely from known left Internal Carotid Artery occlusion, bilateral V4 severe stenosis or occlusion/multivessel atherosclerosis and stenosis.   Resultant  Global aphasia and dysarthria, moving right side less than left, facial droop  CT  head - Nothing acute. Chronic left MCA and/or watershed type infarcts involving the left cerebral hemisphere, with additional chronic bilateral cerebellar infarcts, stable.   MRI head - Multifocal acute ischemia within the left MCA territory, likely embolic due to Chronic occlusion of the left ICA.  Single small focus of acute ischemia in the right cerebellum likely due to v4 stenosis.   MRA head - not performed  CTA H&N - severe stenosis of the left ICA, right V4 occlusion,  and left V4 segment severe stenosis  Carotid Doppler - CTA neck performed - carotid dopplers not indicated.  2D Echo - pending  Lacey Jensen Virus 2  - negative  EEG - see above  LDL - 69  HgbA1c - 7.1  UDS  - not performed this admission  VTE prophylaxis - Lovenox  Diet  - Carb modified with thin liquids  aspirin 81 mg daily and clopidogrel 75 mg daily prior to admission, now on aspirin 81 mg daily and clopidogrel 75 mg daily  Patient counseled to be compliant with his antithrombotic medications  Ongoing aggressive stroke risk factor management  Therapy recommendations:  Possible SNF vs home with family support and HH therapy.  Disposition:  Pending  Hypertension  Blood pressure somewhat high at times but within post stroke parameters . Permissive  hypertension (OK if < 220/120) but gradually normalize in 5-7 days . Long-term BP goal normotensive  Hyperlipidemia  Lipid lowering medication PTA: Lipitor 80 mg daily  LDL 69, goal < 70  Current lipid lowering medication: Lipitor 80 mg daily  Continue statin at discharge  Diabetes  HgbA1c 7.1, goal < 7.0  Uncontrolled  Other Stroke Risk Factors  Advanced age  Previous stroke  Obstructive sleep apnea  Carotid artery disease  Other Active Problems  Anemia - 9.2  Creatinine - 1.61  Dementia  Chronic occlusion of the left ICA by MRI.   Hospital day # 0  Patient with embolic strokes. MRI head shows multifocal acute ischemia within the left MCA territory, likely embolic due to Chronic occlusion of the left ICA.  Single small focus of acute ischemia in the right cerebellum likely due to v4 stenosis. Continue with Asa and Plavix.   Personally examined patient and images, and have participated in and made any corrections needed to history, physical, neuro exam,assessment and plan as stated above.  I have personally obtained the history, evaluated lab date, reviewed imaging studies and agree with radiology interpretations.    Sarina Ill, MD Stroke Neurology   A total of 25 minutes was spent for the care of this patient, spent on counseling patient and family on different diagnostic and therapeutic options, counseling and coordination of care, riskd ans benefits of management, compliance, or risk factor reduction and education.   To contact Stroke Continuity provider, please refer to [email or web address] After hours, contact General Neurology

## 2018-09-03 NOTE — Procedures (Signed)
History: 78yo M with aphasia  Sedation: None  Technique: This is a 21 channel routine scalp EEG performed at the bedside with bipolar and monopolar montages arranged in accordance to the international 10/20 system of electrode placement. One channel was dedicated to EKG recording.    Background: The background consists of intermixed alpha and beta activities. There is a well defined posterior dominant rhythm of 8 Hz that attenuates with eye opening. There is some focal slow activity poorly localized in the left hemisphere, maximal in the frontotemporalregion.  Sleep is not recorded.   Photic stimulation: Physiologic driving is present  EEG Abnormalities: 1) Focal left frontotemporal slow activity with wide field   Clinical Interpretation: This EEG recorded evidence of left hemispheric dysfunction that would be consistent with the patients known stroke. There was no seizure or seizure predisposition recorded on this study. Please note that lack of epileptiform activity on EEG does not preclude the possibility of epilepsy.   Roland Rack, MD [facility] [phone]  If 7pm- 7am, please page neurology on call as listed in [place].

## 2018-09-04 LAB — GLUCOSE, CAPILLARY
Glucose-Capillary: 71 mg/dL (ref 70–99)
Glucose-Capillary: 172 mg/dL — ABNORMAL HIGH (ref 70–99)

## 2018-09-04 MED ORDER — AMLODIPINE BESYLATE 5 MG PO TABS
5.0000 mg | ORAL_TABLET | Freq: Every day | ORAL | Status: DC
  Administered 2018-09-04: 5 mg via ORAL
  Filled 2018-09-04: qty 1

## 2018-09-04 MED ORDER — AMLODIPINE BESYLATE 5 MG PO TABS: 5.0000 mg | ORAL_TABLET | Freq: Every day | ORAL | 0 refills | Status: AC

## 2018-09-04 NOTE — Evaluation (Signed)
Speech Language Pathology Evaluation Patient Details Name: Peter Nicholson MRN: [id number] DOB: 09-Aug-1940 Today's Date: 09/04/2018 Time: 7846-9629 SLP Time Calculation (min) (ACUTE ONLY): 20 min  Problem List:  Patient Active Problem List   Diagnosis Date Noted  . Neurological deficit present 09/03/2018  . Acute CVA (cerebrovascular accident) ([place]) 09/02/2018  . Abscess of back 11/23/2017  . Vascular dementia ([place]) 10/07/2017  . Syncope 06/27/2017  . CKD (chronic kidney disease) stage 3, GFR 30-59 ml/min (HCC) 06/27/2017  . Anemia of chronic disease 06/08/2016  . Dementia ([place]) 05/23/2016  . Bilateral impacted cerumen 05/06/2016  . Mixed conductive and sensorineural hearing loss of both ears 05/06/2016  . Acute lacunar infarction ([place]) 10/27/2015  . History of CVA (cerebrovascular accident) 10/25/2015  . Type 2 diabetes mellitus with stage 3 chronic kidney disease, with long-term current use of insulin ([place]) 10/25/2015  . Stroke-like symptoms 10/25/2015  . Slurred speech 10/25/2015  . History of stroke 08/02/2015  . Hyperlipidemia 08/02/2015  . Inflamed sebaceous cyst 03/11/2015  . B12 deficiency 09/25/2014  . Mild cognitive impairment 09/24/2014  . Cerebral infarction due to thrombosis of left carotid artery ([place])   . Stroke ([place])   . Confusion 04/09/2014  . Cerebrovascular disease   . HLD (hyperlipidemia)   . Carotid stenosis   . Tachycardia 02/26/2014  . Dysesthesia 06/10/2012  . Abdominal bruit 06/10/2012  . Other specified transient cerebral ischemias 06/10/2012  . Occlusion and stenosis of carotid artery without mention of cerebral infarction 06/10/2012  . Sudden visual loss 06/10/2012  . Memory impairment 04/25/2012  . Dysphagia 01/18/2012  . Renal cell cancer ([place]) 01/18/2012  . Acute UTI 04/28/2011  . Renal insufficiency 04/28/2011  . TIA (transient ischemic attack) 03/16/2011  . Closed fracture of tibial plateau 03/11/2011  . Vitamin B12 deficiency 07/25/2008   . OVERWEIGHT 06/06/2008  . ESOPHAGEAL STRICTURE 03/14/2008  . CONSTIPATION 03/14/2008  . OBSTRUCTIVE SLEEP APNEA 11/28/2007  . VENOUS INSUFFICIENCY 11/28/2007  . DIVERTICULOSIS OF COLON 11/28/2007  . NEPHROLITHIASIS 11/28/2007  . Horseshoe kidney 11/28/2007  . HYPERCHOLESTEROLEMIA 03/22/2007  . GOUT 03/22/2007  . Anxiety 03/22/2007  . Essential hypertension 03/22/2007  . GERD 03/22/2007  . HIATAL HERNIA 03/22/2007  . Osteoarthritis 03/22/2007  . BACK PAIN, LUMBAR 03/22/2007   Past Medical History:  Past Medical History:  Diagnosis Date  . Anxiety state, unspecified   . Blood transfusion without reported diagnosis   . Bowel obstruction ([place])   . Calculus of kidney   . Cerebrovascular disease, unspecified   . Diaphragmatic hernia without mention of obstruction or gangrene   . Diverticulosis of colon (without mention of hemorrhage)   . Esophageal reflux   . Gout, unspecified   . History of gallstones   . History of seizures   . HTN (hypertension)   . IBS (irritable bowel syndrome)   . Kidney carcinoma ([place])   . Lumbago   . Obstructive sleep apnea (adult) (pediatric)    doesnt wear CPAP  . Osteoarthrosis, unspecified whether generalized or localized, unspecified site    tendonitis  . Other specified congenital anomaly of kidney   . Overweight(278.02)   . Pure hypercholesterolemia   . Seizures ([place])    last seizure November 2012  . Stroke ([place])    2001  . Type II or unspecified type diabetes mellitus without mention of complication, not stated as uncontrolled   . Unspecified venous (peripheral) insufficiency    Past Surgical History:  Past Surgical History:  Procedure Laterality Date  . CAROTID  ENDARTERECTOMY  09/2000   Dr.Lawson  . CHOLECYSTECTOMY  9/04   Dr. Rebekah Chesterfield  . EXTERNAL FIXATION LEG  03/10/2011   Procedure: EXTERNAL FIXATION LEG;  Surgeon: Rozanna Box;  Location: [place];  Service: Orthopedics;  Laterality: Right;  . INCISIONAL HERNIA REPAIR  1/96    Dr.Leone  . NASAL SINUS SURGERY  12/96   Dr.Redman  . 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 ORIF TIBIA PLATEAU  03/24/2011   Procedure: OPEN REDUCTION INTERNAL FIXATION (ORIF) TIBIAL PLATEAU;  Surgeon: Rozanna Box;  Location: [place] [facility];  Service: Orthopedics;  Laterality: Right;  . RADIOLOGY WITH ANESTHESIA N/A 04/12/2014   Procedure: ANGIOPLASTY;  Surgeon: Rob Hickman, MD;  Location: [facility];  Service: Radiology;  Laterality: N/A;  . SMALL INTESTINE SURGERY    . veterbral art angioplasty     x2   HPI:  78 y.o. male with medical history significant of CVA, left ICA stenosis, right ICA stenosis, dementia, seizures, hypertension, hyperlipidemia, type 2 diabetes presenting to the hospital via EMS for evaluation of aphasia and right facial droop. MRI revealing: multifocal acute ischemia, frontal/temporal ischemia and chronic occlusion of L ICA.   Assessment / Plan / Recommendation Clinical Impression  Pt presents with a mixed aphasia (no longer global) marked by expressive and receptive language deficts.  He is able to name common objects, recite automatic sequences, answer biographical questions, and repeat simple words/phrases.  Pt can follow one step but not two-step commands. He answers factual questions with 60% accuracy.  Efforts at propositional speech are marked by dysfluency, poor word-retrieval; however, with cues pt is able to convey basic information about himself/environment.  Provided education to his wife, Charlett Nose, via phone, giving her internet resources for aphasia and caregiving.  Pt will need [place] SLP services as well as 24 hour supervision.  D/C is planned for today.     SLP Assessment  SLP Recommendation/Assessment: All further Speech Lanaguage Pathology  needs can be addressed in the next venue of care SLP Visit Diagnosis: Aphasia (R47.01)    Follow Up Recommendations       Frequency and Duration           SLP Evaluation Cognition   Overall Cognitive Status: Impaired/Different from baseline Orientation Level: Oriented to person;Disoriented to place;Disoriented to time;Disoriented to situation       Comprehension  Auditory Comprehension Overall Auditory Comprehension: Impaired Yes/No Questions: Impaired Basic Immediate Environment Questions: 50-74% accurate Commands: Impaired Two Step Basic Commands: 25-49% accurate Visual Recognition/Discrimination Discrimination: Exceptions to [facility] Common Objects: Able in field of 3    Expression Expression Primary Mode of Expression: Verbal Verbal Expression Overall Verbal Expression: Impaired Automatic Speech: Social Response;Counting;Day of week Level of Generative/Spontaneous Verbalization: Phrase Repetition: Impaired Level of Impairment: Phrase level Naming: Impairment Confrontation: Impaired Common Objects: Able in field of 3 Convergent: 50-74% accurate Verbal Errors: Semantic paraphasias Pragmatics: No impairment Written Expression Dominant Hand: Right Written Expression: Not tested   Oral / Motor  Oral Motor/Sensory Function Overall Oral Motor/Sensory Function: Mild impairment Facial Symmetry: Abnormal symmetry right Facial Strength: Reduced right Motor Speech Overall Motor Speech: Impaired Articulation: Impaired Level of Impairment: Sentence   GO                    Juan Quam Laurice 09/04/2018, 11:17 AM  Estill Bamberg L. Tivis Ringer, [place] Office number [phone] Pager [phone]

## 2018-09-04 NOTE — TOC Transition Note (Addendum)
Transition of Care [facility]) - CM/SW Discharge Note   Patient Details  Name: Peter Nicholson MRN: [id number] Date of Birth: [birth year]  [facility] CM/SW Contact:  Carles Collet, RN Phone Number: 09/04/2018, 10:34 AM   Clinical Narrative:     RW to be delivered to patient room prior to DC. Spoke with patient's wife, we discussed [place] providers. She would like to use [facility] as she is familiar with them from previous use. Referral placed to [facility].     Final next level of care: Home/Self Care Barriers to Discharge: No Barriers Identified   Patient Goals and CMS Choice        Discharge Placement                       Discharge Plan and Services                DME Arranged: Walker rolling DME Agency: [employer] Date DME Agency Contacted: 09/04/18 Time DME Agency Contacted: [id number] Representative spoke with at DME Agency: [place] ([place]) Interventions     Readmission Risk Interventions No flowsheet data found.

## 2018-09-04 NOTE — Discharge Summary (Signed)
Physician Discharge Summary  ALANMICHAEL BARMORE HCW:[id number] DOB: 07/14/1940 DOA: 09/02/2018  PCP: Imagene Riches, NP  Admit date: 09/02/2018 Discharge date: 09/04/2018  Admitted From: Home Discharge disposition: Home with 24-hour supervision with a rolling walker   Code Status: Full Code   Recommendations for Outpatient Follow-Up:   1. Follow-up with neurology as an outpatient. 2. Follow-up with primary care for blood pressure management.  Discharge Diagnosis:   Principal Problem:   Acute CVA (cerebrovascular accident) ([place]) Active Problems:   Anxiety   Essential hypertension   HLD (hyperlipidemia)   CKD (chronic kidney disease) stage 3, GFR 30-59 ml/min (HCC)   Type 2 diabetes mellitus with stage 3 chronic kidney disease, with long-term current use of insulin ([place])   Neurological deficit present    History of Present Illness / Brief narrative:  Patient is a 78 y.o.malepast medical history of stroke, seizure, left ICA stenosis, right ICA stenosis, dementia.  Family noted patient to have difficulty walking and right facial droop and slurred speech for the last few days.  5/21 -patient was seen by his outpatient neurologist on telemedicine consultation, who felt that the right facial droop was new.  5/22-family noted significant deterioration in his speech and his words were garbled.  EMS brought to the ED Patient was admitted for evaluation TIA/stroke. tPA was not administered because he was outside the window.  Hospital Course:  Stroke:Multiple bilateral infarcts  - embolic -likely from known left Internal Carotid Artery occlusion, bilateral V4 severe stenosis or occlusion/multivessel atherosclerosis and stenosis. - ResultantGlobal aphasia and dysarthria, moving right side less than left, facial droop - CTA H&N -severe stenosis of the leftICA, right V4 occlusion, and leftV4 segment severe stenosis - MRI head -Multifocal acute ischemia within the left MCA  territory, likely embolicdue tochronic occlusion of the left ICA. Single small focus of acute ischemia in the right cerebellum likely due to v4 stenosis. -Echocardiogram pending -EEG -pending secondary to stroke.  Not suggestive of seizure or seizure predisposition. -LDL 69, hemoglobin 7.1 -Prior to admission, patient was on aspirin 81 mg daily and Plavix 75 mg daily.  Neurology recommends to continue the same. -PT eval obtained.  Recommend SNF versus 24-hour supervision at home.  Patient's family prefers to take patient home.  As per physical therapy recommendation, rolling walker has been ordered.  Hypertension -Prior to admission, patient was not on any blood pressure medicines at home. -On arrival, blood pressure was elevated up to 211/63.  Permissive hypertension was allowed for 24 to 48 hours. -Blood pressure remains high in the 150s.  I started the patient on amlodipine 5 mg daily from today.  Encouraged to monitor blood pressure at home and follow-up with primary care provider for dose adjustment.  Hyperlipidemia -Continue Lipitor 80 mg daily at home.  Diabetes -A1c 7.1.  Target less than 8 for age.  -Prior to admission, patient was on Onglyza, metformin, glipizide and insulin Lantus 20 units at bedtime. -Currently on Lantus.  Oral meds on hold. -I think I would aim less strict blood sugar control on this elderly gentleman.   I have spoken with the family and suggested to monitor blood sugar on a regular basis and cut down insulin to prevent any hypoglycemic episodes. -Patient's creatinine is elevated to more than 1.4 for a period of time.  I will stop metformin altogether.  CKD3 -Creatinine 1.6, close to baseline.  Anxiety -Continue home Xanax 0.5 mg twice daily  Dementia -Continue home donezepil, memantine  GERD -Continue PPI  Depression -Continue Zoloft  Stable for discharge to home today.  Subjective:  Patient was seen and examined this morning.   Pleasant elderly Caucasian male.  Propped up in bed.  Not in distress.  No new symptoms overnight.  Discharge Exam:   Vitals:   09/03/18 2010 09/04/18 0003 09/04/18 0331 09/04/18 0811  BP: (!) 161/67 (!) 159/75 (!) 163/69 (!) 162/73  Pulse: 81 99 91 80  Resp: 18 18 18 15   Temp: 98.1 F (36.7 C) 98 F (36.7 C) 98.3 F (36.8 C) 98 F (36.7 C)  TempSrc: Oral Oral Oral Oral  SpO2: 99% 99% 98% 98%  Weight:      Height:        Body mass index is 24.77 kg/m.  General exam: Appears calm and comfortable.  Skin: No rashes, lesions or ulcers. HEENT: Atraumatic, normocephalic, supple neck, no obvious bleeding Lungs: Clear to auscultation bilaterally CVS: Regular rate and rhythm, no murmur GI/Abd soft, nontender, nondistended, bowel sound present CNS: Alert, awake, oriented to place and person, not to time, able to follow motor commands.  Mild dementia at baseline.  Psychiatry: Appropriate mood Extremities: No pedal edema, no calf tenderness  Discharge Instructions:  Wound care: None Discharge Instructions    Diet - low sodium heart healthy   Complete by:  As directed    Increase activity slowly   Complete by:  As directed      Follow-up Information    Imagene Riches, NP Follow up.   Contact information: [place] [address] [phone]        [facility] Follow up.   Specialty:  Neurology Contact information: [address] [id number] [place] [phone]         Allergies as of 09/04/2018      Reactions   Dilaudid [hydromorphone Hcl] Other (See Comments)   hallucinations   Hydromorphone Hcl Other (See Comments)   hallucinations   Fentanyl Other (See Comments)   hallucinations      Medication List    STOP taking these medications   metFORMIN 500 MG tablet Commonly known as:  GLUCOPHAGE     TAKE these medications   ALPRAZolam 0.5 MG tablet Commonly known as:  XANAX 1/2-1 tab BID as  needed for anxiety What changed:    how much to take  how to take this  when to take this  additional instructions   amLODipine 5 MG tablet Commonly known as:  NORVASC Take 1 tablet (5 mg total) by mouth daily. Start taking on:  Sep 05, 2018   aspirin 81 MG EC tablet Take 1 tablet (81 mg total) by mouth daily.   atorvastatin 80 MG tablet Commonly known as:  LIPITOR TAKE 1 TABLET BY MOUTH DAILY AT 6PM What changed:  See the new instructions.   cholecalciferol 1000 units tablet Commonly known as:  VITAMIN D Take 2,000 Units by mouth daily.   clopidogrel 75 MG tablet Commonly known as:  PLAVIX TAKE 1 TABLET BY MOUTH EVERY DAY   donepezil 10 MG tablet Commonly known as:  ARICEPT Take 1 tablet (10 mg total) by mouth daily. What changed:  when to take this   glipiZIDE 5 MG 24 hr tablet Commonly known as:  GLUCOTROL XL Take 1 tablet (5 mg total) by mouth daily with breakfast.   Insulin Glargine 100 UNIT/ML Solostar Pen Commonly known as:  Lantus SoloStar Inject 20 Units into the skin at bedtime.   Insulin  Pen Needle 32G X 4 MM Misc Use 1x a day   memantine 10 MG tablet Commonly known as:  NAMENDA TAKE 1 TABLET EVERY NIGHT 1 WEEK, THEN INCREASE TO 1 TABLET TWICE A DAY What changed:  See the new instructions.   ONE TOUCH ULTRA TEST test strip Generic drug:  glucose blood USE AS INSTRUCTED   pantoprazole 40 MG tablet Commonly known as:  PROTONIX TAKE 1 TABLET (40 MG TOTAL) BY MOUTH DAILY. What changed:  See the new instructions.   saxagliptin HCl 5 MG Tabs tablet Commonly known as:  ONGLYZA Take 1 tablet (5 mg total) by mouth daily.   sertraline 50 MG tablet Commonly known as:  ZOLOFT TAKE 1 TABLET BY MOUTH EVERYDAY AT BEDTIME What changed:  See the new instructions.            Durable Medical Equipment  (From admission, onward)         Start     Ordered   09/04/18 1025  For home use only DME Walker rolling  [facility])  Once    Question:   Patient needs a walker to treat with the following condition  Answer:  Stroke [facility])   09/04/18 1025          Time coordinating discharge: 35 minutes  The results of significant diagnostics from this hospitalization (including imaging, microbiology, ancillary and laboratory) are listed below for reference.    Procedures and Diagnostic Studies:   Ct Angio Head W Or Wo Contrast  Result Date: 09/02/2018 CLINICAL DATA:  Difficulty with speech in ambulation. Right arm and leg drift. Aphasia. EXAM: CT ANGIOGRAPHY HEAD AND NECK TECHNIQUE: Multidetector CT imaging of the head and neck was performed using the standard protocol during bolus administration of intravenous contrast. Multiplanar CT image reconstructions and MIPs were obtained to evaluate the vascular anatomy. Carotid stenosis measurements (when applicable) are obtained utilizing NASCET criteria, using the distal internal carotid diameter as the denominator. CONTRAST:  169mL OMNIPAQUE IOHEXOL 350 MG/ML SOLN COMPARISON:  CTA head neck 02/26/2014 FINDINGS: CTA NECK FINDINGS SKELETON: There is no bony spinal canal stenosis. No lytic or blastic lesion. OTHER NECK: Normal pharynx, larynx and major salivary glands. No cervical lymphadenopathy. Unremarkable thyroid gland. UPPER CHEST: No pneumothorax or pleural effusion. No nodules or masses. AORTIC ARCH: There is mild calcific atherosclerosis of the aortic arch. There is no aneurysm, dissection or hemodynamically significant stenosis of the visualized ascending aorta and aortic arch. Conventional 3 vessel aortic branching pattern. The visualized proximal subclavian arteries are widely patent. RIGHT CAROTID SYSTEM: --Common carotid artery: Widely patent origin without common carotid artery dissection or aneurysm. --Internal carotid artery: No dissection, occlusion or aneurysm. Mild atherosclerotic calcification at the carotid bifurcation without hemodynamically significant stenosis. --External carotid  artery: No acute abnormality. LEFT CAROTID SYSTEM: --Common carotid artery: Widely patent origin without common carotid artery dissection or aneurysm. --Internal carotid artery: Status post endarterectomy. There is severe narrowing of the left internal carotid artery along most of its course with complete loss of enhancement at the petrous segment, unchanged. --External carotid artery: No acute abnormality. VERTEBRAL ARTERIES: Left dominant configuration. Both origins are normal. The right vertebral artery is normal to the skull base. The V4 segment is occluded. The dominant left vertebral artery is also normal to the skull base. There is severe stenosis of the V4 segment secondary to mixed density atherosclerotic plaque. These findings are unchanged. CTA HEAD FINDINGS POSTERIOR CIRCULATION: --Vertebral arteries: Occlusion of the right V4 segment and severe stenosis of  left, unchanged. --Posterior inferior cerebellar arteries (PICA): Patent with shared origins with the AICA. --Anterior inferior cerebellar arteries (AICA): Patent origins from the basilar artery. --Basilar artery: Normal. --Superior cerebellar arteries: Normal. --Posterior cerebral arteries (PCA): Normal. Both originate from the basilar artery. Posterior communicating arteries (p-comm) are diminutive or absent. ANTERIOR CIRCULATION: --Intracranial internal carotid arteries: Mild atherosclerotic calcification on the right. As above, the left ICA is occluded at the skull base. There is opacification of the left carotid terminus via collateral flow. --Anterior cerebral arteries (ACA): Non opacification of the left A1 segment. Patent anterior communicating artery and right A1 segment. Both distal anterior cerebral arteries are patent. --Middle cerebral arteries (MCA): Normal right MCA. Attenuated appearance of the left MCA without occlusion. VENOUS SINUSES: As permitted by contrast timing, patent. ANATOMIC VARIANTS: None Review of the MIP images confirms  the above findings. IMPRESSION: 1. No emergent large vessel occlusion. 2. Unchanged appearance of left internal carotid artery, status post endarterectomy, with severe distal narrowing and occlusion at the petrous segment. 3. Unchanged occlusion of the V4 segment of the right vertebral artery and severe stenosis of the left V4 segment. 4. Attenuated enhancement within the left MCA distribution without occlusion or high-grade stenosis. 5.  Aortic atherosclerosis (ICD10-I70.0). These results were called by telephone at the time of interpretation on 09/02/2018 at 7:04 pm to Dr. Amie Portland , who verbally acknowledged these results. Electronically Signed   By: Ulyses Jarred [name]   On: 09/02/2018 19:04   Ct Angio Neck W Or Wo Contrast  Result Date: 09/02/2018 CLINICAL DATA:  Difficulty with speech in ambulation. Right arm and leg drift. Aphasia. EXAM: CT ANGIOGRAPHY HEAD AND NECK TECHNIQUE: Multidetector CT imaging of the head and neck was performed using the standard protocol during bolus administration of intravenous contrast. Multiplanar CT image reconstructions and MIPs were obtained to evaluate the vascular anatomy. Carotid stenosis measurements (when applicable) are obtained utilizing NASCET criteria, using the distal internal carotid diameter as the denominator. CONTRAST:  186mL OMNIPAQUE IOHEXOL 350 MG/ML SOLN COMPARISON:  CTA head neck 02/26/2014 FINDINGS: CTA NECK FINDINGS SKELETON: There is no bony spinal canal stenosis. No lytic or blastic lesion. OTHER NECK: Normal pharynx, larynx and major salivary glands. No cervical lymphadenopathy. Unremarkable thyroid gland. UPPER CHEST: No pneumothorax or pleural effusion. No nodules or masses. AORTIC ARCH: There is mild calcific atherosclerosis of the aortic arch. There is no aneurysm, dissection or hemodynamically significant stenosis of the visualized ascending aorta and aortic arch. Conventional 3 vessel aortic branching pattern. The visualized proximal  subclavian arteries are widely patent. RIGHT CAROTID SYSTEM: --Common carotid artery: Widely patent origin without common carotid artery dissection or aneurysm. --Internal carotid artery: No dissection, occlusion or aneurysm. Mild atherosclerotic calcification at the carotid bifurcation without hemodynamically significant stenosis. --External carotid artery: No acute abnormality. LEFT CAROTID SYSTEM: --Common carotid artery: Widely patent origin without common carotid artery dissection or aneurysm. --Internal carotid artery: Status post endarterectomy. There is severe narrowing of the left internal carotid artery along most of its course with complete loss of enhancement at the petrous segment, unchanged. --External carotid artery: No acute abnormality. VERTEBRAL ARTERIES: Left dominant configuration. Both origins are normal. The right vertebral artery is normal to the skull base. The V4 segment is occluded. The dominant left vertebral artery is also normal to the skull base. There is severe stenosis of the V4 segment secondary to mixed density atherosclerotic plaque. These findings are unchanged. CTA HEAD FINDINGS POSTERIOR CIRCULATION: --Vertebral arteries: Occlusion of the right V4 segment  and severe stenosis of left, unchanged. --Posterior inferior cerebellar arteries (PICA): Patent with shared origins with the AICA. --Anterior inferior cerebellar arteries (AICA): Patent origins from the basilar artery. --Basilar artery: Normal. --Superior cerebellar arteries: Normal. --Posterior cerebral arteries (PCA): Normal. Both originate from the basilar artery. Posterior communicating arteries (p-comm) are diminutive or absent. ANTERIOR CIRCULATION: --Intracranial internal carotid arteries: Mild atherosclerotic calcification on the right. As above, the left ICA is occluded at the skull base. There is opacification of the left carotid terminus via collateral flow. --Anterior cerebral arteries (ACA): Non opacification of  the left A1 segment. Patent anterior communicating artery and right A1 segment. Both distal anterior cerebral arteries are patent. --Middle cerebral arteries (MCA): Normal right MCA. Attenuated appearance of the left MCA without occlusion. VENOUS SINUSES: As permitted by contrast timing, patent. ANATOMIC VARIANTS: None Review of the MIP images confirms the above findings. IMPRESSION: 1. No emergent large vessel occlusion. 2. Unchanged appearance of left internal carotid artery, status post endarterectomy, with severe distal narrowing and occlusion at the petrous segment. 3. Unchanged occlusion of the V4 segment of the right vertebral artery and severe stenosis of the left V4 segment. 4. Attenuated enhancement within the left MCA distribution without occlusion or high-grade stenosis. 5.  Aortic atherosclerosis (ICD10-I70.0). These results were called by telephone at the time of interpretation on 09/02/2018 at 7:04 pm to Dr. Amie Portland , who verbally acknowledged these results. Electronically Signed   By: Ulyses Jarred [name]   On: 09/02/2018 19:04   Mr Brain Wo Contrast  Result Date: 09/02/2018 CLINICAL DATA:  New onset aphasia.  Right facial droop. EXAM: MRI HEAD WITHOUT CONTRAST TECHNIQUE: Multiplanar, multiecho pulse sequences of the brain and surrounding structures were obtained without intravenous contrast. COMPARISON:  CTA head neck same day FINDINGS: BRAIN: There is multifocal acute ischemia, predominantly within the left MCA territory, most affecting the frontal and temporal lobes. There is a single focus of ischemia in the right cerebellum. There are old bilateral cerebellar infarcts and old left watershed territory infarcts. No midline shift or other mass effect. Multifocal white matter hyperintensity, most commonly due to chronic ischemic microangiopathy. The cerebral and cerebellar volume are age-appropriate. No hydrocephalus. Susceptibility-sensitive sequences show no chronic microhemorrhage or  superficial siderosis. No mass lesion. VASCULAR: Chronic occlusion of the left internal carotid artery at the skull base SKULL AND UPPER CERVICAL SPINE: Calvarial bone marrow signal is normal. There is no skull base mass. Visualized upper cervical spine and soft tissues are normal. SINUSES/ORBITS: No fluid levels or advanced mucosal thickening. No mastoid or middle ear effusion. The orbits are normal. IMPRESSION: 1. Multifocal acute ischemia within the left MCA territory, likely embolic, in keeping with the reported speech and right facial symptoms. 2. Single small focus of acute ischemia in the right cerebellum. 3. No acute hemorrhage or mass effect. 4. Remote left watershed zone infarct and bilateral cerebellar infarcts. 5. Chronic occlusion of the left ICA. Electronically Signed   By: Ulyses Jarred [name]   On: 09/02/2018 19:52   Ct Head Code Stroke Wo Contrast  Result Date: 09/02/2018 CLINICAL DATA:  Code stroke.  Initial evaluation for acute aphasia. EXAM: CT HEAD WITHOUT CONTRAST TECHNIQUE: Contiguous axial images were obtained from the base of the skull through the vertex without intravenous contrast. COMPARISON:  Prior CT from 06/27/2017. FINDINGS: Brain: Generalized age-related cerebral atrophy with mild chronic small vessel ischemic disease. Multiple remote left MCA and/or watershed type territory infarcts seen involving the left cerebral hemisphere, stable. Scattered chronic bilateral  cerebellar infarcts noted as well, also unchanged. No acute intracranial hemorrhage. No acute large vessel territory infarct. No mass lesion, midline shift or mass effect. No hydrocephalus. No extra-axial fluid collection. Vascular: No hyperdense vessel. Prominent atherosclerosis at the skull base. Skull: Scalp soft tissues demonstrate no acute finding. Calvarium intact. Sinuses/Orbits: Globes and orbital soft tissues within normal limits. Scattered chronic mucosal thickening within the ethmoidal air cells and maxillary  sinuses. Paranasal sinuses are otherwise clear. No mastoid effusion. Other: None. ASPECTS [facility] Stroke Program Early CT Score) - Ganglionic level infarction (caudate, lentiform nuclei, internal capsule, insula, M1-M3 cortex): 7 - Supraganglionic infarction (M4-M6 cortex): 3 Total score (0-10 with 10 being normal): 10 IMPRESSION: 1. No acute intracranial infarct or other abnormality identified. 2. ASPECTS is 10. 3. Chronic left MCA and/or watershed type infarcts involving the left cerebral hemisphere, with additional chronic bilateral cerebellar infarcts, stable. 4. Underlying age-related cerebral atrophy with mild chronic small vessel ischemic disease. These results were communicated to Dr. Rory Percy at [facility] 5/22/2020by text page via the [facility] messaging system. Electronically Signed   By: Jeannine Boga [name]   On: 09/02/2018 18:44     Labs:   Basic Metabolic Panel: Recent Labs  Lab 09/02/18 1815 09/02/18 1821 09/03/18 0451  NA 142 141 142  K 5.0 4.9 4.7  CL 113* 114* 114*  CO2 20*  --  20*  GLUCOSE 197* 194* 120*  BUN 37* 35* 33*  CREATININE 1.64* 1.70* 1.61*  CALCIUM 9.6  --  9.4   GFR Estimated Creatinine Clearance: 36.6 mL/min (A) (by C-G formula based on SCr of 1.61 mg/dL (H)). Liver Function Tests: Recent Labs  Lab 09/02/18 1815  AST 13*  ALT 14  ALKPHOS 78  BILITOT 0.4  PROT 6.3*  ALBUMIN 3.2*   No results for input(s): LIPASE, AMYLASE in the last 168 hours. No results for input(s): AMMONIA in the last 168 hours. Coagulation profile Recent Labs  Lab 09/02/18 1815  INR 1.0    CBC: Recent Labs  Lab 09/02/18 1815 09/02/18 1821  WBC 7.1  --   NEUTROABS 5.4  --   HGB 8.8* 9.2*  HCT 29.3* 27.0*  MCV 98.7  --   PLT 217  --    Cardiac Enzymes: No results for input(s): CKTOTAL, CKMB, CKMBINDEX, TROPONINI in the last 168 hours. BNP: Invalid input(s): POCBNP CBG: Recent Labs  Lab 09/03/18 0604 09/03/18 1155 09/03/18 1622 09/03/18 2120  09/04/18 0605  GLUCAP 96 148* 132* 121* 71   D-Dimer No results for input(s): DDIMER in the last 72 hours. Hgb A1c Recent Labs    09/03/18 0451  HGBA1C 7.1*   Lipid Profile Recent Labs    09/03/18 0451  CHOL 135  HDL 55  LDLCALC 69  TRIG 55  CHOLHDL 2.5   Thyroid function studies No results for input(s): TSH, T4TOTAL, T3FREE, THYROIDAB in the last 72 hours.  Invalid input(s): FREET3 Anemia work up No results for input(s): VITAMINB12, FOLATE, FERRITIN, TIBC, IRON, RETICCTPCT in the last 72 hours. Microbiology Recent Results (from the past 240 hour(s))  SARS Coronavirus 2 (CEPHEID - Performed in [place] hospital lab), Hosp Order     Status: None   Collection Time: 09/02/18  8:33 PM  Result Value Ref Range Status   SARS Coronavirus 2 NEGATIVE NEGATIVE Final    Comment: (NOTE) If result is NEGATIVE SARS-CoV-2 target nucleic acids are NOT DETECTED. The SARS-CoV-2 RNA is generally detectable in upper and lower  respiratory specimens during the acute  phase of infection. The lowest  concentration of SARS-CoV-2 viral copies this assay can detect is 250  copies / mL. A negative result does not preclude SARS-CoV-2 infection  and should not be used as the sole basis for treatment or other  patient management decisions.  A negative result may occur with  improper specimen collection / handling, submission of specimen other  than nasopharyngeal swab, presence of viral mutation(s) within the  areas targeted by this assay, and inadequate number of viral copies  (<250 copies / mL). A negative result must be combined with clinical  observations, patient history, and epidemiological information. If result is POSITIVE SARS-CoV-2 target nucleic acids are DETECTED. The SARS-CoV-2 RNA is generally detectable in upper and lower  respiratory specimens dur ing the acute phase of infection.  Positive  results are indicative of active infection with SARS-CoV-2.  Clinical  correlation  with patient history and other diagnostic information is  necessary to determine patient infection status.  Positive results do  not rule out bacterial infection or co-infection with other viruses. If result is PRESUMPTIVE POSTIVE SARS-CoV-2 nucleic acids MAY BE PRESENT.   A presumptive positive result was obtained on the submitted specimen  and confirmed on repeat testing.  While 2019 novel coronavirus  (SARS-CoV-2) nucleic acids may be present in the submitted sample  additional confirmatory testing may be necessary for epidemiological  and / or clinical management purposes  to differentiate between  SARS-CoV-2 and other Sarbecovirus currently known to infect humans.  If clinically indicated additional testing with an alternate test  methodology [phone]) is advised. The SARS-CoV-2 RNA is generally  detectable in upper and lower respiratory sp ecimens during the acute  phase of infection. The expected result is Negative. Fact Sheet for Patients:  StrictlyIdeas.no Fact Sheet for Healthcare Providers: BankingDealers.co.za This test is not yet approved or cleared by the [place] FDA and has been authorized for detection and/or diagnosis of SARS-CoV-2 by FDA under an Emergency Use Authorization (EUA).  This EUA will remain in effect (meaning this test can be used) for the duration of the COVID-19 declaration under Section 564(b)(1) of the Act, 21 U.S.C. section 360bbb-3(b)(1), unless the authorization is terminated or revoked sooner. Performed at [facility], [place] [address], [place], [place] [address]     Signed: Terrilee Croak  [facility] 09/04/2018, 10:25 AM

## 2018-09-04 NOTE — Progress Notes (Signed)
Patient being discharged home with Home health. Education and information given to patient. IV removed. CCMD notified. All belongings with patient including wheelchair. Leaving unit via wheelchair.

## 2018-09-06 DIAGNOSIS — E039 Hypothyroidism, unspecified: Secondary | ICD-10-CM | POA: Diagnosis not present

## 2018-09-06 DIAGNOSIS — Z136 Encounter for screening for cardiovascular disorders: Secondary | ICD-10-CM | POA: Diagnosis not present

## 2018-09-06 DIAGNOSIS — E559 Vitamin D deficiency, unspecified: Secondary | ICD-10-CM | POA: Diagnosis not present

## 2018-09-06 DIAGNOSIS — E119 Type 2 diabetes mellitus without complications: Secondary | ICD-10-CM | POA: Diagnosis not present

## 2018-09-06 NOTE — Telephone Encounter (Signed)
Can do e-visit tomorrow 5/27 at 11:30am, thanks

## 2018-09-06 NOTE — Telephone Encounter (Signed)
He is scheduled for tomorrow at 1:30 for evisit

## 2018-09-06 NOTE — Telephone Encounter (Signed)
Spoke with pt wife, pt came home from the hospital on Sunday she said that he has been doing good, she is getting him scheduled for a follow up appointment, she stated that she has a smart phone and would like to do an e-visit

## 2018-09-07 ENCOUNTER — Encounter: Payer: Self-pay | Admitting: Neurology

## 2018-09-07 ENCOUNTER — Other Ambulatory Visit: Payer: Self-pay

## 2018-09-07 ENCOUNTER — Telehealth (INDEPENDENT_AMBULATORY_CARE_PROVIDER_SITE_OTHER): Payer: Medicare Other | Admitting: Neurology

## 2018-09-07 VITALS — Ht 66.0 in | Wt 165.0 lb

## 2018-09-07 DIAGNOSIS — F0390 Unspecified dementia without behavioral disturbance: Secondary | ICD-10-CM

## 2018-09-07 DIAGNOSIS — E1151 Type 2 diabetes mellitus with diabetic peripheral angiopathy without gangrene: Secondary | ICD-10-CM | POA: Diagnosis not present

## 2018-09-07 DIAGNOSIS — G4733 Obstructive sleep apnea (adult) (pediatric): Secondary | ICD-10-CM | POA: Diagnosis not present

## 2018-09-07 DIAGNOSIS — I63232 Cerebral infarction due to unspecified occlusion or stenosis of left carotid arteries: Secondary | ICD-10-CM | POA: Diagnosis not present

## 2018-09-07 DIAGNOSIS — K219 Gastro-esophageal reflux disease without esophagitis: Secondary | ICD-10-CM | POA: Diagnosis not present

## 2018-09-07 DIAGNOSIS — I69351 Hemiplegia and hemiparesis following cerebral infarction affecting right dominant side: Secondary | ICD-10-CM | POA: Diagnosis not present

## 2018-09-07 DIAGNOSIS — I129 Hypertensive chronic kidney disease with stage 1 through stage 4 chronic kidney disease, or unspecified chronic kidney disease: Secondary | ICD-10-CM | POA: Diagnosis not present

## 2018-09-07 DIAGNOSIS — I872 Venous insufficiency (chronic) (peripheral): Secondary | ICD-10-CM | POA: Diagnosis not present

## 2018-09-07 DIAGNOSIS — Z6824 Body mass index (BMI) 24.0-24.9, adult: Secondary | ICD-10-CM | POA: Diagnosis not present

## 2018-09-07 DIAGNOSIS — I69322 Dysarthria following cerebral infarction: Secondary | ICD-10-CM | POA: Diagnosis not present

## 2018-09-07 DIAGNOSIS — F03B0 Unspecified dementia, moderate, without behavioral disturbance, psychotic disturbance, mood disturbance, and anxiety: Secondary | ICD-10-CM

## 2018-09-07 DIAGNOSIS — N183 Chronic kidney disease, stage 3 (moderate): Secondary | ICD-10-CM | POA: Diagnosis not present

## 2018-09-07 DIAGNOSIS — M1990 Unspecified osteoarthritis, unspecified site: Secondary | ICD-10-CM | POA: Diagnosis not present

## 2018-09-07 DIAGNOSIS — I69392 Facial weakness following cerebral infarction: Secondary | ICD-10-CM | POA: Diagnosis not present

## 2018-09-07 DIAGNOSIS — Z85528 Personal history of other malignant neoplasm of kidney: Secondary | ICD-10-CM | POA: Diagnosis not present

## 2018-09-07 DIAGNOSIS — Z794 Long term (current) use of insulin: Secondary | ICD-10-CM | POA: Diagnosis not present

## 2018-09-07 DIAGNOSIS — M109 Gout, unspecified: Secondary | ICD-10-CM | POA: Diagnosis not present

## 2018-09-07 DIAGNOSIS — I672 Cerebral atherosclerosis: Secondary | ICD-10-CM

## 2018-09-07 DIAGNOSIS — Z7982 Long term (current) use of aspirin: Secondary | ICD-10-CM | POA: Diagnosis not present

## 2018-09-07 DIAGNOSIS — I69320 Aphasia following cerebral infarction: Secondary | ICD-10-CM | POA: Diagnosis not present

## 2018-09-07 DIAGNOSIS — Z7902 Long term (current) use of antithrombotics/antiplatelets: Secondary | ICD-10-CM | POA: Diagnosis not present

## 2018-09-07 DIAGNOSIS — Z8669 Personal history of other diseases of the nervous system and sense organs: Secondary | ICD-10-CM | POA: Diagnosis not present

## 2018-09-07 DIAGNOSIS — E1122 Type 2 diabetes mellitus with diabetic chronic kidney disease: Secondary | ICD-10-CM | POA: Diagnosis not present

## 2018-09-07 DIAGNOSIS — E7800 Pure hypercholesterolemia, unspecified: Secondary | ICD-10-CM | POA: Diagnosis not present

## 2018-09-07 NOTE — Progress Notes (Signed)
Virtual Visit via Video Note The purpose of this virtual visit is to provide medical care while limiting exposure to the novel coronavirus.    Consent was obtained for video visit:  Yes.   Answered questions that patient had about telehealth interaction:  Yes.   I discussed the limitations, risks, security and privacy concerns of performing an evaluation and management service by telemedicine. I also discussed with the patient that there may be a patient responsible charge related to this service. The patient expressed understanding and agreed to proceed.  Pt location: Home Physician Location: office Name of referring provider:  Imagene Riches, NP I connected with Peter Nicholson at patients initiation/request on 09/07/2018 at  1:30 PM EDT by video enabled telemedicine application and verified that I am speaking with the correct person using two identifiers. Pt MRN:  [id number] Pt DOB:  1940/05/14 Video Participants:  Peter Nicholson;  Adair Laundry (wife)   History of Present Illness:  The patient was last seen a week ago for right facial droop and dysarthria that started several days prior. Family opted to keep him home and do outpatient workup. He was brought by family to the hospital the day after due to significant deterioration with garbled speech. In the ER, his BP was 190/147. NIHSS 8. He had global aphasia and dysarthria, moving right side less than left, facial droop. I personally reviewed MRI brain without contrast done 09/02/2018 which showed multifocal acute ischemia, predominantly in the left MCA territory, most affecting the frontal and temporal lobes. There was a single focus of ischemia in the right cerebellum. CTA head and neck showed unchanged appearance of left ICA s/p CEA, with severe distal narrowing and occlusion at the petrous segment, unchanged occlusion of the V4 segment of the right vertebral artery and severe stenosis of the left V4 segmant. There was attenuated  enhancement within the left MCA distribution without occlusion or high-grade stenosis. Stroke was felt to be embolic, likely from known left ICA occlusion, bilateral V4 severe stenosis or occlusion/multivessel atherosclerosis and stenosis. He had an EEG showing focal left frontotemporal slow activity with wide field, no epileptiform discharges. During his stay, BP was elevated. He had been taking aspirin and Plavix and was discharged again on both, with addition of amlodipine. His HbA1c was 7.1, family has had difficulties controlling blood sugar levels. LDL 69, he is on Lipitor 80mg  daily.   Family opted to bring him home with home health. His wife reports he is a lot better since hospital discharge, he has improved a lot. He is noted to have improvement in facial asymmetry, less dysarthric. He mostly answers yes or no questions, he can follow instructions. He is still dragging his right leg, but it is a little better, per family.    Current Outpatient Medications on File Prior to Visit  Medication Sig Dispense Refill  . ALPRAZolam (XANAX) 0.5 MG tablet 1/2-1 tab BID as needed for anxiety (Patient taking differently: Take 0.5 mg by mouth 2 (two) times a day. ) 60 tablet 5  . amLODipine (NORVASC) 5 MG tablet Take 1 tablet (5 mg total) by mouth daily. 90 tablet 0  . aspirin EC 81 MG EC tablet Take 1 tablet (81 mg total) by mouth daily. 30 tablet 0  . atorvastatin (LIPITOR) 80 MG tablet TAKE 1 TABLET BY MOUTH DAILY AT 6PM (Patient taking differently: Take 80 mg by mouth at bedtime. ) 90 tablet 3  . cholecalciferol (VITAMIN D) 1000 units tablet Take  2,000 Units by mouth daily.    . clopidogrel (PLAVIX) 75 MG tablet TAKE 1 TABLET BY MOUTH EVERY DAY (Patient taking differently: Take 75 mg by mouth daily. ) 90 tablet 1  . donepezil (ARICEPT) 10 MG tablet Take 1 tablet (10 mg total) by mouth daily. (Patient taking differently: Take 10 mg by mouth at bedtime. ) 90 tablet 3  . glipiZIDE (GLUCOTROL XL) 5 MG 24 hr  tablet Take 1 tablet (5 mg total) by mouth daily with breakfast. 90 tablet 3  . Insulin Glargine (LANTUS SOLOSTAR) 100 UNIT/ML Solostar Pen Inject 20 Units into the skin at bedtime. 10 pen 3  . Insulin Pen Needle 32G X 4 MM MISC Use 1x a day 100 each 3  . memantine (NAMENDA) 10 MG tablet TAKE 1 TABLET EVERY NIGHT 1 WEEK, THEN INCREASE TO 1 TABLET TWICE A DAY (Patient taking differently: Take 10 mg by mouth 2 (two) times daily. ) 180 tablet 4  . ONE TOUCH ULTRA TEST test strip USE AS INSTRUCTED 100 each 2  . pantoprazole (PROTONIX) 40 MG tablet TAKE 1 TABLET (40 MG TOTAL) BY MOUTH DAILY. (Patient taking differently: Take 40 mg by mouth daily. ) 90 tablet 0  . saxagliptin HCl (ONGLYZA) 5 MG TABS tablet Take 1 tablet (5 mg total) by mouth daily. 15 tablet 0  . sertraline (ZOLOFT) 50 MG tablet TAKE 1 TABLET BY MOUTH EVERYDAY AT BEDTIME (Patient taking differently: Take 50 mg by mouth at bedtime. ) 90 tablet 2   No current facility-administered medications on file prior to visit.      Observations/Objective:   Vitals:   09/07/18 1339  Weight: 165 lb (74.8 kg)  Height: 5\' 6"  (1.676 m)   GEN:  The patient appears stated age and is in NAD.  Neurological examination: Patient is awake, alert, oriented to person, place. There is mild dysarthria (improved from prior visit). Cranial nerves: Extraocular movements intact with no nystagmus. There is improvement in right facial droop compared to prior visit, still with asymmetric smile. Motor: moves all extremities symmetrically, at least anti-gravity x 4. No incoordination on finger to nose testing. Gait: slow and cautious, no ataxia  Assessment and Plan:   This is a 78 yo RH man with hypertension, diabetes, hyperlipidemia, vascular dementia, peripheral vascular disease, with recurrent strokes (Nov/Dec 2015, most recently 08/2018) due to left ICA occlusion, intracranial atherosclerosis. He presents today for hospital follow-up after another stroke with  right-sided weakness. Continue aspirin, Plavix, stricter control of vascular risk factors. BP and glucose levels have been elevated. Family reports improvement in symptoms since hospital discharge, continue with home PT/OT/ST. Family advised to speak with his vascular specialist regarding potential options, if any, with recurrent strokes from the left ICA. Family knows to go to ER if there is progressive worsening of symptoms. Follow-up as scheduled in September, they know to call for any changes.   Follow Up Instructions:    -I discussed the assessment and treatment plan with the patient/wife. The patient/wife were provided an opportunity to ask questions and all were answered. The patient/wife agreed with the plan and demonstrated an understanding of the instructions.   The patient's family was advised to call back or seek an in-person evaluation if the symptoms worsen or if the condition fails to improve as anticipated.    Cameron Sprang, MD

## 2018-09-08 DIAGNOSIS — I69320 Aphasia following cerebral infarction: Secondary | ICD-10-CM | POA: Diagnosis not present

## 2018-09-08 DIAGNOSIS — K219 Gastro-esophageal reflux disease without esophagitis: Secondary | ICD-10-CM | POA: Diagnosis not present

## 2018-09-08 DIAGNOSIS — I69322 Dysarthria following cerebral infarction: Secondary | ICD-10-CM | POA: Diagnosis not present

## 2018-09-08 DIAGNOSIS — I129 Hypertensive chronic kidney disease with stage 1 through stage 4 chronic kidney disease, or unspecified chronic kidney disease: Secondary | ICD-10-CM | POA: Diagnosis not present

## 2018-09-08 DIAGNOSIS — I872 Venous insufficiency (chronic) (peripheral): Secondary | ICD-10-CM | POA: Diagnosis not present

## 2018-09-08 DIAGNOSIS — N183 Chronic kidney disease, stage 3 (moderate): Secondary | ICD-10-CM | POA: Diagnosis not present

## 2018-09-08 DIAGNOSIS — I69392 Facial weakness following cerebral infarction: Secondary | ICD-10-CM | POA: Diagnosis not present

## 2018-09-08 DIAGNOSIS — E1151 Type 2 diabetes mellitus with diabetic peripheral angiopathy without gangrene: Secondary | ICD-10-CM | POA: Diagnosis not present

## 2018-09-08 DIAGNOSIS — G4733 Obstructive sleep apnea (adult) (pediatric): Secondary | ICD-10-CM | POA: Diagnosis not present

## 2018-09-08 DIAGNOSIS — Z7982 Long term (current) use of aspirin: Secondary | ICD-10-CM | POA: Diagnosis not present

## 2018-09-08 DIAGNOSIS — I69351 Hemiplegia and hemiparesis following cerebral infarction affecting right dominant side: Secondary | ICD-10-CM | POA: Diagnosis not present

## 2018-09-08 DIAGNOSIS — M109 Gout, unspecified: Secondary | ICD-10-CM | POA: Diagnosis not present

## 2018-09-08 DIAGNOSIS — E1122 Type 2 diabetes mellitus with diabetic chronic kidney disease: Secondary | ICD-10-CM | POA: Diagnosis not present

## 2018-09-08 DIAGNOSIS — Z6824 Body mass index (BMI) 24.0-24.9, adult: Secondary | ICD-10-CM | POA: Diagnosis not present

## 2018-09-08 DIAGNOSIS — Z794 Long term (current) use of insulin: Secondary | ICD-10-CM | POA: Diagnosis not present

## 2018-09-08 DIAGNOSIS — Z85528 Personal history of other malignant neoplasm of kidney: Secondary | ICD-10-CM | POA: Diagnosis not present

## 2018-09-08 DIAGNOSIS — E7800 Pure hypercholesterolemia, unspecified: Secondary | ICD-10-CM | POA: Diagnosis not present

## 2018-09-08 DIAGNOSIS — Z7902 Long term (current) use of antithrombotics/antiplatelets: Secondary | ICD-10-CM | POA: Diagnosis not present

## 2018-09-08 DIAGNOSIS — M1990 Unspecified osteoarthritis, unspecified site: Secondary | ICD-10-CM | POA: Diagnosis not present

## 2018-09-08 DIAGNOSIS — Z8669 Personal history of other diseases of the nervous system and sense organs: Secondary | ICD-10-CM | POA: Diagnosis not present

## 2018-09-12 ENCOUNTER — Ambulatory Visit (INDEPENDENT_AMBULATORY_CARE_PROVIDER_SITE_OTHER): Payer: Medicare Other | Admitting: Internal Medicine

## 2018-09-12 ENCOUNTER — Encounter: Payer: Self-pay | Admitting: Internal Medicine

## 2018-09-12 DIAGNOSIS — E1159 Type 2 diabetes mellitus with other circulatory complications: Secondary | ICD-10-CM

## 2018-09-12 DIAGNOSIS — E1165 Type 2 diabetes mellitus with hyperglycemia: Secondary | ICD-10-CM | POA: Diagnosis not present

## 2018-09-12 NOTE — Patient Instructions (Addendum)
Please continue: - Metformin 500 mg 2x a day, with meals - Glipizide XL 5 mg before b'fast  Please increase: - Lantus 10 units in am and 20 units at bedtime  Please return in 3-4 months with your sugar log.

## 2018-09-12 NOTE — Progress Notes (Signed)
Patient ID: Peter Nicholson, male   DOB: 1941-01-14, 78 y.o.   MRN: [id number]   Patient location: Home My location: Office  I connected with the patient, his wife, and his RN on 09/12/18 at  2:06 PM EDT by telephone and verified that I am speaking with the correct person.   I discussed the limitations of evaluation and management by telephone and the availability of in person appointments. The patient expressed understanding and agreed to proceed.   Details of the encounter are shown below.  HPI: Peter Nicholson is a 78 y.o.-year-old male, referred by his PCP, Dr. Lenna Nicholson, presenting for follow-up for DM2, dx in ~2010, insulin-dependent since ~2015, uncontrolled, with long-term complications (cerebrovascular disease - s/p TIA and stroke, chronic left ICA occlusion; CKD stage III; venous insufficiency).  His wife is offering most of the history as the patient has dementia. New PCP: Dr. Heide Nicholson, [place]  Pt recently had a stroke 09/02/2018. He has speech impairment.   Reviewed latest HbA1c level: Lab Results  Component Value Date   HGBA1C 7.1 (H) 09/03/2018   HGBA1C 7.3 (A) 05/31/2018   HGBA1C 7.5 (H) 03/23/2018   HGBA1C 6.4 12/07/2016   HGBA1C 6.7 (H) 02/25/2015   HGBA1C 6.8 (H) 08/23/2014   HGBA1C 7.6 (H) 05/25/2014   HGBA1C 9.3 (H) 04/09/2014   HGBA1C 9.0 (H) 02/26/2014   HGBA1C 7.3 (H) 11/21/2013   Pt is on a regimen of: - Metformin 1000 >> 500 mg 2x a day, with meals - Glipizide XL 5 mg before b'fast - Lantus 15 >> 20 units at bedtime He stopped Onglyza >> stopped 6 mo ago b/c $$$.  Pt checks his sugars once a day: - am: n/c  - 2h after b'fast: n/c - before lunch: n/c - 2h after lunch: n/c - before dinner: n/c - 2h after dinner: n/c - bedtime: 150-240 >> 310-380 - nighttime: n/c Lowest sugar was 100s >> 140;  It is unclear at which level he has hypoglycemia awareness. Highest sugar was 300s >> 470.  Glucometer: One Touch  Pt's meals are: - Brunch: sausage patty  + scrambled egg; ham, egg and cheese toast; OJ, sweet tea, milk - Dinner: meat + veggies + starch - Snacks: snacks throughout the day: cookies, fruit Decrease intake of OJ, sweet tea, milk.  -+ CKD stage III, last BUN/creatinine:  Lab Results  Component Value Date   BUN 33 (H) 09/03/2018   BUN 35 (H) 09/02/2018   CREATININE 1.61 (H) 09/03/2018   CREATININE 1.70 (H) 09/02/2018  03/23/2018: GFR 47.33 Not on ACE inhibitor/ARB.  -+ HL; last set of lipids: Lab Results  Component Value Date   CHOL 135 09/03/2018   HDL 55 09/03/2018   LDLCALC 69 09/03/2018   LDLDIRECT 149.1 01/15/2010   TRIG 55 09/03/2018   CHOLHDL 2.5 09/03/2018  On Lipitor 80.  - last eye exam was in  11/2016: No DR  - no numbness and tingling in his feet.  Pt has no FH of DM.  He has a h/o kidney cancer >> R nephrectomy.  ROS: Constitutional: no weight gain/no weight loss, no fatigue, no subjective hyperthermia, no subjective hypothermia Eyes: no blurry vision, no xerophthalmia ENT: no sore throat, no nodules palpated in neck, no dysphagia, no odynophagia, no hoarseness Cardiovascular: no CP/no SOB/no palpitations/no leg swelling Respiratory: no cough/no SOB/no wheezing Gastrointestinal: no N/no V/no D/no C/no acid reflux Musculoskeletal: no muscle aches/no joint aches Skin: no rashes, no hair loss Neurological: no tremors/no numbness/no tingling/no  dizziness  I reviewed pt's medications, allergies, PMH, social hx, family hx, and changes were documented in the history of present illness. Otherwise, unchanged from my initial visit note.  Past Medical History:  Diagnosis Date  . Anxiety state, unspecified   . Blood transfusion without reported diagnosis   . Bowel obstruction ([place])   . Calculus of kidney   . Cerebrovascular disease, unspecified   . Diaphragmatic hernia without mention of obstruction or gangrene   . Diverticulosis of colon (without mention of hemorrhage)   . Esophageal reflux   .  Gout, unspecified   . History of gallstones   . History of seizures   . HTN (hypertension)   . IBS (irritable bowel syndrome)   . Kidney carcinoma ([place])   . Lumbago   . Obstructive sleep apnea (adult) (pediatric)    doesnt wear CPAP  . Osteoarthrosis, unspecified whether generalized or localized, unspecified site    tendonitis  . Other specified congenital anomaly of kidney   . Overweight(278.02)   . Pure hypercholesterolemia   . Seizures ([place])    last seizure November 2012  . Stroke ([place])    2001  . Type II or unspecified type diabetes mellitus without mention of complication, not stated as uncontrolled   . Unspecified venous (peripheral) insufficiency    Past Surgical History:  Procedure Laterality Date  . CAROTID ENDARTERECTOMY  09/2000   Dr.Lawson  . CHOLECYSTECTOMY  9/04   Dr. Rebekah [place]field  . EXTERNAL FIXATION LEG  03/10/2011   Procedure: EXTERNAL FIXATION LEG;  Surgeon: Rozanna Box;  Location: [place];  Service: Orthopedics;  Laterality: Right;  . INCISIONAL HERNIA REPAIR  1/96   Dr.Leone  . NASAL SINUS SURGERY  12/96   Dr.Redman  . 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 ORIF TIBIA PLATEAU  03/24/2011   Procedure: OPEN REDUCTION INTERNAL FIXATION (ORIF) TIBIAL PLATEAU;  Surgeon: Rozanna Box;  Location: [place];  Service: Orthopedics;  Laterality: Right;  . RADIOLOGY WITH ANESTHESIA N/A 04/12/2014   Procedure: ANGIOPLASTY;  Surgeon: Rob Hickman, MD;  Location: [place];  Service: Radiology;  Laterality: N/A;  . SMALL INTESTINE SURGERY    . veterbral art angioplasty     x2   Social History   Socioeconomic History  . Marital status: Married    Spouse name: Peter Nicholson  . Number of children: 2  . Years of education: Not on file  . Highest education level: Not on file  Occupational History  . 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 Financial resource strain: Not on file  . Food insecurity:    Worry: Not on file     Inability: Not on file  . Transportation needs:    Medical: Not on file    Non-medical: Not on file  Tobacco Use  . Smoking status: Never Smoker  . Smokeless tobacco: Never Used  Substance and Sexual Activity  . Alcohol use: No    Alcohol/week: 0.0 standard drinks  . Drug use: No  . Sexual activity: Not Currently  Lifestyle  . Physical activity:    Days per week: Not on file    Minutes per session: Not on file  . Stress: Not on file  Relationships  . Social connections:    Talks on phone: Not on file    Gets together: Not on file    Attends religious service: Not on file    Active member  of club or organization: Not on file    Attends meetings of clubs or organizations: Not on file    Relationship status: Not on file  . Intimate partner violence:    Fear of current or ex partner: Not on file    Emotionally abused: Not on file    Physically abused: Not on file    Forced sexual activity: Not on file  Other Topics Concern  . Not on file  Social History Narrative   Patient drinks 4 cups of a caffinated Drinks week.   Current Outpatient Medications on File Prior to Visit  Medication Sig Dispense Refill  . ALPRAZolam (XANAX) 0.5 MG tablet 1/2-1 tab BID as needed for anxiety (Patient taking differently: Take 0.5 mg by mouth 2 (two) times a day. ) 60 tablet 5  . amLODipine (NORVASC) 5 MG tablet Take 1 tablet (5 mg total) by mouth daily. 90 tablet 0  . aspirin EC 81 MG EC tablet Take 1 tablet (81 mg total) by mouth daily. 30 tablet 0  . atorvastatin (LIPITOR) 80 MG tablet TAKE 1 TABLET BY MOUTH DAILY AT 6PM (Patient taking differently: Take 80 mg by mouth at bedtime. ) 90 tablet 3  . cholecalciferol (VITAMIN D) 1000 units tablet Take 2,000 Units by mouth daily.    . clopidogrel (PLAVIX) 75 MG tablet TAKE 1 TABLET BY MOUTH EVERY DAY (Patient taking differently: Take 75 mg by mouth daily. ) 90 tablet 1  . donepezil (ARICEPT) 10 MG tablet Take 1 tablet (10 mg total) by mouth daily.  (Patient taking differently: Take 10 mg by mouth at bedtime. ) 90 tablet 3  . glipiZIDE (GLUCOTROL XL) 5 MG 24 hr tablet Take 1 tablet (5 mg total) by mouth daily with breakfast. 90 tablet 3  . Insulin Glargine (LANTUS SOLOSTAR) 100 UNIT/ML Solostar Pen Inject 20 Units into the skin at bedtime. 10 pen 3  . Insulin Pen Needle 32G X 4 MM MISC Use 1x a day 100 each 3  . memantine (NAMENDA) 10 MG tablet TAKE 1 TABLET EVERY NIGHT 1 WEEK, THEN INCREASE TO 1 TABLET TWICE A DAY (Patient taking differently: Take 10 mg by mouth 2 (two) times daily. ) 180 tablet 4  . ONE TOUCH ULTRA TEST test strip USE AS INSTRUCTED 100 each 2  . pantoprazole (PROTONIX) 40 MG tablet TAKE 1 TABLET (40 MG TOTAL) BY MOUTH DAILY. (Patient taking differently: Take 40 mg by mouth daily. ) 90 tablet 0  . saxagliptin HCl (ONGLYZA) 5 MG TABS tablet Take 1 tablet (5 mg total) by mouth daily. 15 tablet 0  . sertraline (ZOLOFT) 50 MG tablet TAKE 1 TABLET BY MOUTH EVERYDAY AT BEDTIME (Patient taking differently: Take 50 mg by mouth at bedtime. ) 90 tablet 2   No current facility-administered medications on file prior to visit.    Allergies  Allergen Reactions  . Dilaudid [Hydromorphone Hcl] Other (See Comments)    hallucinations  . Hydromorphone Hcl Other (See Comments)    hallucinations  . Fentanyl Other (See Comments)    hallucinations   Family History  Problem Relation Age of Onset  . Heart attack Father   . Dementia Mother   . Ovarian cancer Paternal Grandmother   . Breast cancer Sister   . Colon cancer Neg Hx   . Esophageal cancer Neg Hx   . Rectal cancer Neg Hx   . Stomach cancer Neg Hx     PE: There were no vitals taken for this visit. Wt  Readings from Last 3 Encounters:  09/07/18 165 lb (74.8 kg)  09/02/18 162 lb 14.7 oz (73.9 kg)  09/01/18 160 lb (72.6 kg)   Constitutional:  in NAD  The physical exam was not performed (telephone visit).  ASSESSMENT: 1. DM2, insulin-dependent, uncontrolled, with  long-term complications - cerebrovascular disease, chronic left ICA occlusion - s/p TIA and stroke, also with vascular dementia - CKD stage III - venous insufficiency  PLAN:  1. Patient with longstanding, uncontrolled, type 2 diabetes, with improved control lately, on oral antidiabetic regimen + basal insulin.  -At last visit, sugars after dinner and at bedtime were higher than goal as he was snacking after dinner and was also drinking orange juice, milk, or sweet tea throughout the day.  We discussed about the importance of coming off these sweet drinks.  He declined a referral to nutrition at that time - Reviewed most recent HbA1c, which was improved from before, at 7.1%, however, upon talking with the wife and daughter, his sugars are still very high at that time, in the 300s.  They are trying to hide or not by sweet drinks, but this is still working progress.  We discussed about increasing the Lantus to 10 units in the a.m. and 20 units at bedtime, but I strongly advised him to start checking sugars earlier in the day, also, so we can draw conclusions about patterns.  At next visit, he may need mealtime insulin. - I suggested to:  Patient Instructions  Please continue: - Metformin 500 mg 2x a day, with meals - Glipizide XL 5 mg before b'fast  Please increase: - Lantus 10 units in am and 20 units at bedtime  Please return in 3-4 months with your sugar log.    - check sugars at different times of the day - check 1x a day, rotating checks - advised for yearly eye exams >> he is not UTD - Return to clinic in 3-4 mo with sugar log   - time spent with the patient and his wife and daughter: 13 min, of which >50% was spent in obtaining information about his symptoms, reviewing his previous labs, evaluations, and treatments, counseling him about his condition (please see the discussed topics above), and developing a plan to further investigate and treat it; he had a number of questions which I  addressed.  Philemon Kingdom, MD PhD [facility]

## 2018-09-13 DIAGNOSIS — Z6824 Body mass index (BMI) 24.0-24.9, adult: Secondary | ICD-10-CM | POA: Diagnosis not present

## 2018-09-13 DIAGNOSIS — Z8669 Personal history of other diseases of the nervous system and sense organs: Secondary | ICD-10-CM | POA: Diagnosis not present

## 2018-09-13 DIAGNOSIS — E1122 Type 2 diabetes mellitus with diabetic chronic kidney disease: Secondary | ICD-10-CM | POA: Diagnosis not present

## 2018-09-13 DIAGNOSIS — Z7982 Long term (current) use of aspirin: Secondary | ICD-10-CM | POA: Diagnosis not present

## 2018-09-13 DIAGNOSIS — E1151 Type 2 diabetes mellitus with diabetic peripheral angiopathy without gangrene: Secondary | ICD-10-CM | POA: Diagnosis not present

## 2018-09-13 DIAGNOSIS — I129 Hypertensive chronic kidney disease with stage 1 through stage 4 chronic kidney disease, or unspecified chronic kidney disease: Secondary | ICD-10-CM | POA: Diagnosis not present

## 2018-09-13 DIAGNOSIS — Z85528 Personal history of other malignant neoplasm of kidney: Secondary | ICD-10-CM | POA: Diagnosis not present

## 2018-09-13 DIAGNOSIS — I69320 Aphasia following cerebral infarction: Secondary | ICD-10-CM | POA: Diagnosis not present

## 2018-09-13 DIAGNOSIS — I69392 Facial weakness following cerebral infarction: Secondary | ICD-10-CM | POA: Diagnosis not present

## 2018-09-13 DIAGNOSIS — I69351 Hemiplegia and hemiparesis following cerebral infarction affecting right dominant side: Secondary | ICD-10-CM | POA: Diagnosis not present

## 2018-09-13 DIAGNOSIS — Z7902 Long term (current) use of antithrombotics/antiplatelets: Secondary | ICD-10-CM | POA: Diagnosis not present

## 2018-09-13 DIAGNOSIS — M109 Gout, unspecified: Secondary | ICD-10-CM | POA: Diagnosis not present

## 2018-09-13 DIAGNOSIS — M1990 Unspecified osteoarthritis, unspecified site: Secondary | ICD-10-CM | POA: Diagnosis not present

## 2018-09-13 DIAGNOSIS — Z794 Long term (current) use of insulin: Secondary | ICD-10-CM | POA: Diagnosis not present

## 2018-09-13 DIAGNOSIS — I872 Venous insufficiency (chronic) (peripheral): Secondary | ICD-10-CM | POA: Diagnosis not present

## 2018-09-13 DIAGNOSIS — E7800 Pure hypercholesterolemia, unspecified: Secondary | ICD-10-CM | POA: Diagnosis not present

## 2018-09-13 DIAGNOSIS — K219 Gastro-esophageal reflux disease without esophagitis: Secondary | ICD-10-CM | POA: Diagnosis not present

## 2018-09-13 DIAGNOSIS — N183 Chronic kidney disease, stage 3 (moderate): Secondary | ICD-10-CM | POA: Diagnosis not present

## 2018-09-13 DIAGNOSIS — G4733 Obstructive sleep apnea (adult) (pediatric): Secondary | ICD-10-CM | POA: Diagnosis not present

## 2018-09-13 DIAGNOSIS — I69322 Dysarthria following cerebral infarction: Secondary | ICD-10-CM | POA: Diagnosis not present

## 2018-09-15 DIAGNOSIS — I69351 Hemiplegia and hemiparesis following cerebral infarction affecting right dominant side: Secondary | ICD-10-CM | POA: Diagnosis not present

## 2018-09-15 DIAGNOSIS — I129 Hypertensive chronic kidney disease with stage 1 through stage 4 chronic kidney disease, or unspecified chronic kidney disease: Secondary | ICD-10-CM | POA: Diagnosis not present

## 2018-09-15 DIAGNOSIS — K219 Gastro-esophageal reflux disease without esophagitis: Secondary | ICD-10-CM | POA: Diagnosis not present

## 2018-09-15 DIAGNOSIS — G4733 Obstructive sleep apnea (adult) (pediatric): Secondary | ICD-10-CM | POA: Diagnosis not present

## 2018-09-15 DIAGNOSIS — Z6824 Body mass index (BMI) 24.0-24.9, adult: Secondary | ICD-10-CM | POA: Diagnosis not present

## 2018-09-15 DIAGNOSIS — I69392 Facial weakness following cerebral infarction: Secondary | ICD-10-CM | POA: Diagnosis not present

## 2018-09-15 DIAGNOSIS — E1151 Type 2 diabetes mellitus with diabetic peripheral angiopathy without gangrene: Secondary | ICD-10-CM | POA: Diagnosis not present

## 2018-09-15 DIAGNOSIS — I872 Venous insufficiency (chronic) (peripheral): Secondary | ICD-10-CM | POA: Diagnosis not present

## 2018-09-15 DIAGNOSIS — Z794 Long term (current) use of insulin: Secondary | ICD-10-CM | POA: Diagnosis not present

## 2018-09-15 DIAGNOSIS — Z8669 Personal history of other diseases of the nervous system and sense organs: Secondary | ICD-10-CM | POA: Diagnosis not present

## 2018-09-15 DIAGNOSIS — M1990 Unspecified osteoarthritis, unspecified site: Secondary | ICD-10-CM | POA: Diagnosis not present

## 2018-09-15 DIAGNOSIS — I69322 Dysarthria following cerebral infarction: Secondary | ICD-10-CM | POA: Diagnosis not present

## 2018-09-15 DIAGNOSIS — E7800 Pure hypercholesterolemia, unspecified: Secondary | ICD-10-CM | POA: Diagnosis not present

## 2018-09-15 DIAGNOSIS — E1122 Type 2 diabetes mellitus with diabetic chronic kidney disease: Secondary | ICD-10-CM | POA: Diagnosis not present

## 2018-09-15 DIAGNOSIS — I69320 Aphasia following cerebral infarction: Secondary | ICD-10-CM | POA: Diagnosis not present

## 2018-09-15 DIAGNOSIS — N183 Chronic kidney disease, stage 3 (moderate): Secondary | ICD-10-CM | POA: Diagnosis not present

## 2018-09-15 DIAGNOSIS — Z85528 Personal history of other malignant neoplasm of kidney: Secondary | ICD-10-CM | POA: Diagnosis not present

## 2018-09-15 DIAGNOSIS — M109 Gout, unspecified: Secondary | ICD-10-CM | POA: Diagnosis not present

## 2018-09-15 DIAGNOSIS — Z7982 Long term (current) use of aspirin: Secondary | ICD-10-CM | POA: Diagnosis not present

## 2018-09-15 DIAGNOSIS — Z7902 Long term (current) use of antithrombotics/antiplatelets: Secondary | ICD-10-CM | POA: Diagnosis not present

## 2018-09-20 DIAGNOSIS — Z85528 Personal history of other malignant neoplasm of kidney: Secondary | ICD-10-CM | POA: Diagnosis not present

## 2018-09-20 DIAGNOSIS — Z6824 Body mass index (BMI) 24.0-24.9, adult: Secondary | ICD-10-CM | POA: Diagnosis not present

## 2018-09-20 DIAGNOSIS — I69320 Aphasia following cerebral infarction: Secondary | ICD-10-CM | POA: Diagnosis not present

## 2018-09-20 DIAGNOSIS — Z8669 Personal history of other diseases of the nervous system and sense organs: Secondary | ICD-10-CM | POA: Diagnosis not present

## 2018-09-20 DIAGNOSIS — K219 Gastro-esophageal reflux disease without esophagitis: Secondary | ICD-10-CM | POA: Diagnosis not present

## 2018-09-20 DIAGNOSIS — I872 Venous insufficiency (chronic) (peripheral): Secondary | ICD-10-CM | POA: Diagnosis not present

## 2018-09-20 DIAGNOSIS — I69322 Dysarthria following cerebral infarction: Secondary | ICD-10-CM | POA: Diagnosis not present

## 2018-09-20 DIAGNOSIS — E1122 Type 2 diabetes mellitus with diabetic chronic kidney disease: Secondary | ICD-10-CM | POA: Diagnosis not present

## 2018-09-20 DIAGNOSIS — I69392 Facial weakness following cerebral infarction: Secondary | ICD-10-CM | POA: Diagnosis not present

## 2018-09-20 DIAGNOSIS — M1990 Unspecified osteoarthritis, unspecified site: Secondary | ICD-10-CM | POA: Diagnosis not present

## 2018-09-20 DIAGNOSIS — E1151 Type 2 diabetes mellitus with diabetic peripheral angiopathy without gangrene: Secondary | ICD-10-CM | POA: Diagnosis not present

## 2018-09-20 DIAGNOSIS — Z7902 Long term (current) use of antithrombotics/antiplatelets: Secondary | ICD-10-CM | POA: Diagnosis not present

## 2018-09-20 DIAGNOSIS — I69351 Hemiplegia and hemiparesis following cerebral infarction affecting right dominant side: Secondary | ICD-10-CM | POA: Diagnosis not present

## 2018-09-20 DIAGNOSIS — E7800 Pure hypercholesterolemia, unspecified: Secondary | ICD-10-CM | POA: Diagnosis not present

## 2018-09-20 DIAGNOSIS — Z7982 Long term (current) use of aspirin: Secondary | ICD-10-CM | POA: Diagnosis not present

## 2018-09-20 DIAGNOSIS — M109 Gout, unspecified: Secondary | ICD-10-CM | POA: Diagnosis not present

## 2018-09-20 DIAGNOSIS — N183 Chronic kidney disease, stage 3 (moderate): Secondary | ICD-10-CM | POA: Diagnosis not present

## 2018-09-20 DIAGNOSIS — I129 Hypertensive chronic kidney disease with stage 1 through stage 4 chronic kidney disease, or unspecified chronic kidney disease: Secondary | ICD-10-CM | POA: Diagnosis not present

## 2018-09-20 DIAGNOSIS — G4733 Obstructive sleep apnea (adult) (pediatric): Secondary | ICD-10-CM | POA: Diagnosis not present

## 2018-09-20 DIAGNOSIS — Z794 Long term (current) use of insulin: Secondary | ICD-10-CM | POA: Diagnosis not present

## 2018-09-23 DIAGNOSIS — Z8669 Personal history of other diseases of the nervous system and sense organs: Secondary | ICD-10-CM | POA: Diagnosis not present

## 2018-09-23 DIAGNOSIS — E1122 Type 2 diabetes mellitus with diabetic chronic kidney disease: Secondary | ICD-10-CM | POA: Diagnosis not present

## 2018-09-23 DIAGNOSIS — K219 Gastro-esophageal reflux disease without esophagitis: Secondary | ICD-10-CM | POA: Diagnosis not present

## 2018-09-23 DIAGNOSIS — N183 Chronic kidney disease, stage 3 (moderate): Secondary | ICD-10-CM | POA: Diagnosis not present

## 2018-09-23 DIAGNOSIS — Z7982 Long term (current) use of aspirin: Secondary | ICD-10-CM | POA: Diagnosis not present

## 2018-09-23 DIAGNOSIS — I69392 Facial weakness following cerebral infarction: Secondary | ICD-10-CM | POA: Diagnosis not present

## 2018-09-23 DIAGNOSIS — I129 Hypertensive chronic kidney disease with stage 1 through stage 4 chronic kidney disease, or unspecified chronic kidney disease: Secondary | ICD-10-CM | POA: Diagnosis not present

## 2018-09-23 DIAGNOSIS — Z85528 Personal history of other malignant neoplasm of kidney: Secondary | ICD-10-CM | POA: Diagnosis not present

## 2018-09-23 DIAGNOSIS — E1151 Type 2 diabetes mellitus with diabetic peripheral angiopathy without gangrene: Secondary | ICD-10-CM | POA: Diagnosis not present

## 2018-09-23 DIAGNOSIS — I69320 Aphasia following cerebral infarction: Secondary | ICD-10-CM | POA: Diagnosis not present

## 2018-09-23 DIAGNOSIS — G4733 Obstructive sleep apnea (adult) (pediatric): Secondary | ICD-10-CM | POA: Diagnosis not present

## 2018-09-23 DIAGNOSIS — Z7902 Long term (current) use of antithrombotics/antiplatelets: Secondary | ICD-10-CM | POA: Diagnosis not present

## 2018-09-23 DIAGNOSIS — M109 Gout, unspecified: Secondary | ICD-10-CM | POA: Diagnosis not present

## 2018-09-23 DIAGNOSIS — E7800 Pure hypercholesterolemia, unspecified: Secondary | ICD-10-CM | POA: Diagnosis not present

## 2018-09-23 DIAGNOSIS — I69351 Hemiplegia and hemiparesis following cerebral infarction affecting right dominant side: Secondary | ICD-10-CM | POA: Diagnosis not present

## 2018-09-23 DIAGNOSIS — Z6824 Body mass index (BMI) 24.0-24.9, adult: Secondary | ICD-10-CM | POA: Diagnosis not present

## 2018-09-23 DIAGNOSIS — I69322 Dysarthria following cerebral infarction: Secondary | ICD-10-CM | POA: Diagnosis not present

## 2018-09-23 DIAGNOSIS — Z794 Long term (current) use of insulin: Secondary | ICD-10-CM | POA: Diagnosis not present

## 2018-09-23 DIAGNOSIS — I872 Venous insufficiency (chronic) (peripheral): Secondary | ICD-10-CM | POA: Diagnosis not present

## 2018-09-23 DIAGNOSIS — M1990 Unspecified osteoarthritis, unspecified site: Secondary | ICD-10-CM | POA: Diagnosis not present

## 2018-09-26 DIAGNOSIS — E7800 Pure hypercholesterolemia, unspecified: Secondary | ICD-10-CM | POA: Diagnosis not present

## 2018-09-26 DIAGNOSIS — I872 Venous insufficiency (chronic) (peripheral): Secondary | ICD-10-CM | POA: Diagnosis not present

## 2018-09-26 DIAGNOSIS — I129 Hypertensive chronic kidney disease with stage 1 through stage 4 chronic kidney disease, or unspecified chronic kidney disease: Secondary | ICD-10-CM | POA: Diagnosis not present

## 2018-09-26 DIAGNOSIS — N183 Chronic kidney disease, stage 3 (moderate): Secondary | ICD-10-CM | POA: Diagnosis not present

## 2018-09-26 DIAGNOSIS — Z6824 Body mass index (BMI) 24.0-24.9, adult: Secondary | ICD-10-CM | POA: Diagnosis not present

## 2018-09-26 DIAGNOSIS — Z794 Long term (current) use of insulin: Secondary | ICD-10-CM | POA: Diagnosis not present

## 2018-09-26 DIAGNOSIS — Z85528 Personal history of other malignant neoplasm of kidney: Secondary | ICD-10-CM | POA: Diagnosis not present

## 2018-09-26 DIAGNOSIS — Z7982 Long term (current) use of aspirin: Secondary | ICD-10-CM | POA: Diagnosis not present

## 2018-09-26 DIAGNOSIS — E1151 Type 2 diabetes mellitus with diabetic peripheral angiopathy without gangrene: Secondary | ICD-10-CM | POA: Diagnosis not present

## 2018-09-26 DIAGNOSIS — Z8669 Personal history of other diseases of the nervous system and sense organs: Secondary | ICD-10-CM | POA: Diagnosis not present

## 2018-09-26 DIAGNOSIS — I69351 Hemiplegia and hemiparesis following cerebral infarction affecting right dominant side: Secondary | ICD-10-CM | POA: Diagnosis not present

## 2018-09-26 DIAGNOSIS — G4733 Obstructive sleep apnea (adult) (pediatric): Secondary | ICD-10-CM | POA: Diagnosis not present

## 2018-09-26 DIAGNOSIS — K219 Gastro-esophageal reflux disease without esophagitis: Secondary | ICD-10-CM | POA: Diagnosis not present

## 2018-09-26 DIAGNOSIS — E1122 Type 2 diabetes mellitus with diabetic chronic kidney disease: Secondary | ICD-10-CM | POA: Diagnosis not present

## 2018-09-26 DIAGNOSIS — I69322 Dysarthria following cerebral infarction: Secondary | ICD-10-CM | POA: Diagnosis not present

## 2018-09-26 DIAGNOSIS — I69392 Facial weakness following cerebral infarction: Secondary | ICD-10-CM | POA: Diagnosis not present

## 2018-09-26 DIAGNOSIS — M109 Gout, unspecified: Secondary | ICD-10-CM | POA: Diagnosis not present

## 2018-09-26 DIAGNOSIS — I69320 Aphasia following cerebral infarction: Secondary | ICD-10-CM | POA: Diagnosis not present

## 2018-09-26 DIAGNOSIS — M1990 Unspecified osteoarthritis, unspecified site: Secondary | ICD-10-CM | POA: Diagnosis not present

## 2018-09-26 DIAGNOSIS — Z7902 Long term (current) use of antithrombotics/antiplatelets: Secondary | ICD-10-CM | POA: Diagnosis not present

## 2018-09-28 ENCOUNTER — Other Ambulatory Visit: Payer: Self-pay | Admitting: Pulmonary Disease

## 2018-09-28 DIAGNOSIS — I69392 Facial weakness following cerebral infarction: Secondary | ICD-10-CM | POA: Diagnosis not present

## 2018-09-28 DIAGNOSIS — I129 Hypertensive chronic kidney disease with stage 1 through stage 4 chronic kidney disease, or unspecified chronic kidney disease: Secondary | ICD-10-CM | POA: Diagnosis not present

## 2018-09-28 DIAGNOSIS — E7800 Pure hypercholesterolemia, unspecified: Secondary | ICD-10-CM | POA: Diagnosis not present

## 2018-09-28 DIAGNOSIS — I69322 Dysarthria following cerebral infarction: Secondary | ICD-10-CM | POA: Diagnosis not present

## 2018-09-28 DIAGNOSIS — E1122 Type 2 diabetes mellitus with diabetic chronic kidney disease: Secondary | ICD-10-CM | POA: Diagnosis not present

## 2018-09-28 DIAGNOSIS — I69320 Aphasia following cerebral infarction: Secondary | ICD-10-CM | POA: Diagnosis not present

## 2018-09-28 DIAGNOSIS — M1990 Unspecified osteoarthritis, unspecified site: Secondary | ICD-10-CM | POA: Diagnosis not present

## 2018-09-28 DIAGNOSIS — E1151 Type 2 diabetes mellitus with diabetic peripheral angiopathy without gangrene: Secondary | ICD-10-CM | POA: Diagnosis not present

## 2018-09-28 DIAGNOSIS — I872 Venous insufficiency (chronic) (peripheral): Secondary | ICD-10-CM | POA: Diagnosis not present

## 2018-09-28 DIAGNOSIS — K219 Gastro-esophageal reflux disease without esophagitis: Secondary | ICD-10-CM | POA: Diagnosis not present

## 2018-09-28 DIAGNOSIS — Z794 Long term (current) use of insulin: Secondary | ICD-10-CM | POA: Diagnosis not present

## 2018-09-28 DIAGNOSIS — Z6824 Body mass index (BMI) 24.0-24.9, adult: Secondary | ICD-10-CM | POA: Diagnosis not present

## 2018-09-28 DIAGNOSIS — Z7982 Long term (current) use of aspirin: Secondary | ICD-10-CM | POA: Diagnosis not present

## 2018-09-28 DIAGNOSIS — I69351 Hemiplegia and hemiparesis following cerebral infarction affecting right dominant side: Secondary | ICD-10-CM | POA: Diagnosis not present

## 2018-09-28 DIAGNOSIS — Z7902 Long term (current) use of antithrombotics/antiplatelets: Secondary | ICD-10-CM | POA: Diagnosis not present

## 2018-09-28 DIAGNOSIS — N183 Chronic kidney disease, stage 3 (moderate): Secondary | ICD-10-CM | POA: Diagnosis not present

## 2018-09-28 DIAGNOSIS — G4733 Obstructive sleep apnea (adult) (pediatric): Secondary | ICD-10-CM | POA: Diagnosis not present

## 2018-09-28 DIAGNOSIS — Z85528 Personal history of other malignant neoplasm of kidney: Secondary | ICD-10-CM | POA: Diagnosis not present

## 2018-09-28 DIAGNOSIS — Z8669 Personal history of other diseases of the nervous system and sense organs: Secondary | ICD-10-CM | POA: Diagnosis not present

## 2018-09-28 DIAGNOSIS — M109 Gout, unspecified: Secondary | ICD-10-CM | POA: Diagnosis not present

## 2018-09-29 ENCOUNTER — Other Ambulatory Visit: Payer: Self-pay | Admitting: Pulmonary Disease

## 2018-11-12 ENCOUNTER — Emergency Department (HOSPITAL_COMMUNITY)
Admission: EM | Admit: 2018-11-12 | Discharge: 2018-11-12 | Disposition: A | Discharge status: Home / Self Care | Payer: Medicare Other | Attending: Emergency Medicine | Admitting: Emergency Medicine

## 2018-11-12 ENCOUNTER — Encounter (HOSPITAL_COMMUNITY): Payer: Self-pay | Admitting: Emergency Medicine

## 2018-11-12 ENCOUNTER — Emergency Department (HOSPITAL_COMMUNITY): Payer: Medicare Other

## 2018-11-12 ENCOUNTER — Other Ambulatory Visit: Payer: Self-pay

## 2018-11-12 DIAGNOSIS — N183 Chronic kidney disease, stage 3 (moderate): Secondary | ICD-10-CM | POA: Diagnosis not present

## 2018-11-12 DIAGNOSIS — R51 Headache: Secondary | ICD-10-CM | POA: Diagnosis not present

## 2018-11-12 DIAGNOSIS — Z794 Long term (current) use of insulin: Secondary | ICD-10-CM | POA: Insufficient documentation

## 2018-11-12 DIAGNOSIS — E11649 Type 2 diabetes mellitus with hypoglycemia without coma: Secondary | ICD-10-CM | POA: Insufficient documentation

## 2018-11-12 DIAGNOSIS — R4182 Altered mental status, unspecified: Secondary | ICD-10-CM | POA: Diagnosis present

## 2018-11-12 DIAGNOSIS — Z7982 Long term (current) use of aspirin: Secondary | ICD-10-CM | POA: Insufficient documentation

## 2018-11-12 DIAGNOSIS — R531 Weakness: Secondary | ICD-10-CM | POA: Insufficient documentation

## 2018-11-12 DIAGNOSIS — Z85528 Personal history of other malignant neoplasm of kidney: Secondary | ICD-10-CM | POA: Insufficient documentation

## 2018-11-12 DIAGNOSIS — Z7901 Long term (current) use of anticoagulants: Secondary | ICD-10-CM | POA: Diagnosis not present

## 2018-11-12 DIAGNOSIS — E162 Hypoglycemia, unspecified: Secondary | ICD-10-CM

## 2018-11-12 DIAGNOSIS — F0390 Unspecified dementia without behavioral disturbance: Secondary | ICD-10-CM | POA: Insufficient documentation

## 2018-11-12 DIAGNOSIS — R262 Difficulty in walking, not elsewhere classified: Secondary | ICD-10-CM | POA: Diagnosis not present

## 2018-11-12 DIAGNOSIS — I129 Hypertensive chronic kidney disease with stage 1 through stage 4 chronic kidney disease, or unspecified chronic kidney disease: Secondary | ICD-10-CM | POA: Diagnosis not present

## 2018-11-12 DIAGNOSIS — E1165 Type 2 diabetes mellitus with hyperglycemia: Secondary | ICD-10-CM | POA: Diagnosis not present

## 2018-11-12 DIAGNOSIS — Z79899 Other long term (current) drug therapy: Secondary | ICD-10-CM | POA: Diagnosis not present

## 2018-11-12 LAB — COMPREHENSIVE METABOLIC PANEL
ALT: 15 U/L (ref 0–44)
AST: 18 U/L (ref 15–41)
Albumin: 3.1 g/dL — ABNORMAL LOW (ref 3.5–5.0)
Alkaline Phosphatase: 67 U/L (ref 38–126)
Anion gap: 10 (ref 5–15)
BUN: 38 mg/dL — ABNORMAL HIGH (ref 8–23)
CO2: 21 mmol/L — ABNORMAL LOW (ref 22–32)
Calcium: 9.2 mg/dL (ref 8.9–10.3)
Chloride: 111 mmol/L (ref 98–111)
Creatinine, Ser: 1.73 mg/dL — ABNORMAL HIGH (ref 0.61–1.24)
GFR calc Af Amer: 43 mL/min — ABNORMAL LOW (ref >60)
GFR calc non Af Amer: 37 mL/min — ABNORMAL LOW (ref >60)
Glucose, Bld: 67 mg/dL — ABNORMAL LOW (ref 70–99)
Potassium: 4.3 mmol/L (ref 3.5–5.1)
Sodium: 142 mmol/L (ref 135–145)
Total Bilirubin: 0.4 mg/dL (ref 0.3–1.2)
Total Protein: 5.9 g/dL — ABNORMAL LOW (ref 6.5–8.1)

## 2018-11-12 LAB — CBC
HCT: 27.3 % — ABNORMAL LOW (ref 39.0–52.0)
Hemoglobin: 8.4 g/dL — ABNORMAL LOW (ref 13.0–17.0)
MCH: 29.3 pg (ref 26.0–34.0)
MCHC: 30.8 g/dL (ref 30.0–36.0)
MCV: 95.1 fL (ref 80.0–100.0)
Platelets: 229 10*3/uL (ref 150–400)
RBC: 2.87 MIL/uL — ABNORMAL LOW (ref 4.22–5.81)
RDW: 13.3 % (ref 11.5–15.5)
WBC: 6.3 10*3/uL (ref 4.0–10.5)
nRBC: 0 % (ref 0.0–0.2)

## 2018-11-12 LAB — CBG MONITORING, ED
Glucose-Capillary: 43 mg/dL — CL (ref 70–99)
Glucose-Capillary: 124 mg/dL — ABNORMAL HIGH (ref 70–99)
Glucose-Capillary: 70 mg/dL (ref 70–99)
Glucose-Capillary: 128 mg/dL — ABNORMAL HIGH (ref 70–99)

## 2018-11-12 LAB — URINALYSIS, ROUTINE W REFLEX MICROSCOPIC
Bacteria, UA: NONE SEEN
Bilirubin Urine: NEGATIVE
Glucose, UA: NEGATIVE mg/dL
Hgb urine dipstick: NEGATIVE
Ketones, ur: NEGATIVE mg/dL
Nitrite: NEGATIVE
Protein, ur: 30 mg/dL — AB
Specific Gravity, Urine: 1.012 (ref 1.005–1.030)
pH: 5 (ref 5.0–8.0)

## 2018-11-12 LAB — I-STAT CHEM 8, ED
BUN: 37 mg/dL — ABNORMAL HIGH (ref 8–23)
Calcium, Ion: 1.29 mmol/L (ref 1.15–1.40)
Chloride: 112 mmol/L — ABNORMAL HIGH (ref 98–111)
Creatinine, Ser: 1.7 mg/dL — ABNORMAL HIGH (ref 0.61–1.24)
Glucose, Bld: 60 mg/dL — ABNORMAL LOW (ref 70–99)
HCT: 27 % — ABNORMAL LOW (ref 39.0–52.0)
Hemoglobin: 9.2 g/dL — ABNORMAL LOW (ref 13.0–17.0)
Potassium: 4.3 mmol/L (ref 3.5–5.1)
Sodium: 143 mmol/L (ref 135–145)
TCO2: 21 mmol/L — ABNORMAL LOW (ref 22–32)

## 2018-11-12 LAB — DIFFERENTIAL
Abs Immature Granulocytes: 0.03 10*3/uL (ref 0.00–0.07)
Basophils Absolute: 0 10*3/uL (ref 0.0–0.1)
Basophils Relative: 1 %
Eosinophils Absolute: 0.2 10*3/uL (ref 0.0–0.5)
Eosinophils Relative: 3 %
Immature Granulocytes: 1 %
Lymphocytes Relative: 9 %
Lymphs Abs: 0.6 10*3/uL — ABNORMAL LOW (ref 0.7–4.0)
Monocytes Absolute: 0.6 10*3/uL (ref 0.1–1.0)
Monocytes Relative: 9 %
Neutro Abs: 4.9 10*3/uL (ref 1.7–7.7)
Neutrophils Relative %: 77 %

## 2018-11-12 LAB — PROTIME-INR
INR: 1.1 (ref 0.8–1.2)
Prothrombin Time: 13.7 seconds (ref 11.4–15.2)

## 2018-11-12 LAB — APTT: aPTT: 27 seconds (ref 24–36)

## 2018-11-12 LAB — AMMONIA: Ammonia: 15 umol/L (ref 9–35)

## 2018-11-12 MED ORDER — SODIUM CHLORIDE 0.9% FLUSH
3.0000 mL | Freq: Once | INTRAVENOUS | Status: AC
  Administered 2018-11-12: 3 mL via INTRAVENOUS

## 2018-11-12 MED ORDER — DEXTROSE 50 % IV SOLN
25.0000 mL | Freq: Once | INTRAVENOUS | Status: AC
  Administered 2018-11-12: 25 mL via INTRAVENOUS
  Filled 2018-11-12: qty 50

## 2018-11-12 NOTE — Discharge Instructions (Addendum)
You have been diagnosed today with Transient Confusion  At this time there does not appear to be the presence of an emergent medical condition, however there is always the potential for conditions to change. Please read and follow the below instructions.  Please return to the Emergency Department immediately for any new or worsening symptoms. Please be sure to follow up with your Primary Care Provider within one week regarding your visit today; please call their office to schedule an appointment even if you are feeling better for a follow-up visit. Monitor your blood sugars daily.  Please be sure to eat regular meals and drink plenty of water to avoid low blood sugar.  Get help right away if you: Feel that you are not able to care for yourself. Develop severe headaches, repeated vomiting, seizures, blackouts, or slurred speech. Have increasing confusion, weakness, numbness, restlessness, or personality changes. Develop a loss of balance, have marked dizziness, feel uncoordinated, or fall. You still have symptoms after you eat or drink something sugary. Your blood sugar is at or below 54 mg/dL (3 mmol/L). You have jerky movements that you cannot control. You pass out. Develop severe anxiety, or you have delusions or hallucinations. You have any new/concerning or worsening symptoms  Please read the additional information packets attached to your discharge summary.  Do not take your medicine if  develop an itchy rash, swelling in your mouth or lips, or difficulty breathing; call 911 and seek immediate emergency medical attention if this occurs.

## 2018-11-12 NOTE — ED Triage Notes (Signed)
Pt BIB [facility] from home. Pt family woke pt up around 1300 this afternoon and pt was very weak and had trouble walking. Pt has history of stroke in July with residual speech problems. Upon EMS arrival, Pt up walking, A&Ox4, some speech problems but unsure if it is changed from pt baseline. VSS.

## 2018-11-12 NOTE — ED Provider Notes (Signed)
[facility] EMERGENCY DEPARTMENT Provider Note   CSN: [id number] Arrival date & time: 11/12/18  1426    History   Chief Complaint Chief Complaint  Patient presents with  . Altered Mental Status    HPI Peter Nicholson is a 78 y.o. male.     HPI Patient is brought to the emergency department by EMS with limited amount of history.  Patient is a limited historian.  He is speaking quietly and has no significant complaints.  He vaguely endorses some general weakness.  He is not endorsing any pain.  Per EMS report the patient awakened at about 1 in the afternoon and was "very weak and had trouble walking".  He reportedly has history of stroke from July that resulted in some residual speech problems.  Per nursing triage from EMS, the patient was up and walking upon their arrival and alert and oriented x4 with some speech problems.  I have tried to contact the patient's daughter and wife as has Engineer, civil (consulting).  Patient does not know their phone numbers.  Numbers are used from the demographic sheet.  On my first interview, the patient was too quiet and answering questions vaguely and not identifying place and time.  Later in the course of his care he has incorrectly identified time and the president.  Unclear if his overall orientation has ever been accurate. Past Medical History:  Diagnosis Date  . Anxiety state, unspecified   . Blood transfusion without reported diagnosis   . Bowel obstruction ([place])   . Calculus of kidney   . Cerebrovascular disease, unspecified   . Diaphragmatic hernia without mention of obstruction or gangrene   . Diverticulosis of colon (without mention of hemorrhage)   . Esophageal reflux   . Gout, unspecified   . History of gallstones   . History of seizures   . HTN (hypertension)   . IBS (irritable bowel syndrome)   . Kidney carcinoma ([place])   . Lumbago   . Obstructive sleep apnea (adult) (pediatric)    doesnt wear CPAP  . Osteoarthrosis,  unspecified whether generalized or localized, unspecified site    tendonitis  . Other specified congenital anomaly of kidney   . Overweight(278.02)   . Pure hypercholesterolemia   . Seizures ([place])    last seizure November 2012  . Stroke ([place])    2001  . Type II or unspecified type diabetes mellitus without mention of complication, not stated as uncontrolled   . Unspecified venous (peripheral) insufficiency     Patient Active Problem List   Diagnosis Date Noted  . Neurological deficit present 09/03/2018  . Acute CVA (cerebrovascular accident) ([place]) 09/02/2018  . Abscess of back 11/23/2017  . Vascular dementia ([place]) 10/07/2017  . Syncope 06/27/2017  . CKD (chronic kidney disease) stage 3, GFR 30-59 ml/min (HCC) 06/27/2017  . Anemia of chronic disease 06/08/2016  . Dementia ([place]) 05/23/2016  . Bilateral impacted cerumen 05/06/2016  . Mixed conductive and sensorineural hearing loss of both ears 05/06/2016  . Acute lacunar infarction ([place]) 10/27/2015  . History of CVA (cerebrovascular accident) 10/25/2015  . Type 2 diabetes mellitus with stage 3 chronic kidney disease, with long-term current use of insulin ([place]) 10/25/2015  . Stroke-like symptoms 10/25/2015  . Slurred speech 10/25/2015  . History of stroke 08/02/2015  . Hyperlipidemia 08/02/2015  . Inflamed sebaceous cyst 03/11/2015  . B12 deficiency 09/25/2014  . Mild cognitive impairment 09/24/2014  . Cerebral infarction due to thrombosis of left carotid artery ([place])   .  Stroke ([place])   . Confusion 04/09/2014  . Cerebrovascular disease   . HLD (hyperlipidemia)   . Carotid stenosis   . Tachycardia 02/26/2014  . Dysesthesia 06/10/2012  . Abdominal bruit 06/10/2012  . Other specified transient cerebral ischemias 06/10/2012  . Occlusion and stenosis of carotid artery without mention of cerebral infarction 06/10/2012  . Sudden visual loss 06/10/2012  . Memory impairment 04/25/2012  . Dysphagia 01/18/2012  . Renal cell cancer  ([place]) 01/18/2012  . Acute UTI 04/28/2011  . Renal insufficiency 04/28/2011  . TIA (transient ischemic attack) 03/16/2011  . Closed fracture of tibial plateau 03/11/2011  . Vitamin B12 deficiency 07/25/2008  . OVERWEIGHT 06/06/2008  . ESOPHAGEAL STRICTURE 03/14/2008  . CONSTIPATION 03/14/2008  . OBSTRUCTIVE SLEEP APNEA 11/28/2007  . VENOUS INSUFFICIENCY 11/28/2007  . DIVERTICULOSIS OF COLON 11/28/2007  . NEPHROLITHIASIS 11/28/2007  . Horseshoe kidney 11/28/2007  . HYPERCHOLESTEROLEMIA 03/22/2007  . GOUT 03/22/2007  . Anxiety 03/22/2007  . Essential hypertension 03/22/2007  . GERD 03/22/2007  . HIATAL HERNIA 03/22/2007  . Osteoarthritis 03/22/2007  . BACK PAIN, LUMBAR 03/22/2007    Past Surgical History:  Procedure Laterality Date  . CAROTID ENDARTERECTOMY  09/2000   Dr.Lawson  . CHOLECYSTECTOMY  9/04   Dr. Rebekah Chesterfield  . EXTERNAL FIXATION LEG  03/10/2011   Procedure: EXTERNAL FIXATION LEG;  Surgeon: Rozanna Box;  Location: [facility];  Service: Orthopedics;  Laterality: Right;  . INCISIONAL HERNIA REPAIR  1/96   Dr.Leone  . NASAL SINUS SURGERY  12/96   Dr.Redman  . 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 ORIF TIBIA PLATEAU  03/24/2011   Procedure: OPEN REDUCTION INTERNAL FIXATION (ORIF) TIBIAL PLATEAU;  Surgeon: Rozanna Box;  Location: [facility];  Service: Orthopedics;  Laterality: Right;  . RADIOLOGY WITH ANESTHESIA N/A 04/12/2014   Procedure: ANGIOPLASTY;  Surgeon: Rob Hickman, MD;  Location: [facility];  Service: Radiology;  Laterality: N/A;  . SMALL INTESTINE SURGERY    . veterbral art angioplasty     x2        Home Medications    Prior to Admission medications   Medication Sig Start Date End Date Taking? Authorizing Provider  ALPRAZolam (XANAX) 0.5 MG tablet 1/2-1 tab BID as needed for anxiety Patient taking differently: Take 0.5 mg by mouth 2 (two) times a day.  03/29/18   Noralee Space, MD  amLODipine (NORVASC) 5 MG tablet Take 1  tablet (5 mg total) by mouth daily. 09/05/18 12/04/18  Terrilee Croak, MD  aspirin EC 81 MG EC tablet Take 1 tablet (81 mg total) by mouth daily. 02/28/14   Ghimire, Henreitta Leber, MD  atorvastatin (LIPITOR) 80 MG tablet TAKE 1 TABLET BY MOUTH DAILY AT 6PM Patient taking differently: Take 80 mg by mouth at bedtime.  03/25/18   Noralee Space, MD  cholecalciferol (VITAMIN D) 1000 units tablet Take 2,000 Units by mouth daily.    [provider]  clopidogrel (PLAVIX) 75 MG tablet TAKE 1 TABLET BY MOUTH EVERY DAY Patient taking differently: Take 75 mg by mouth daily.  05/04/18   Noralee Space, MD  donepezil (ARICEPT) 10 MG tablet Take 1 tablet (10 mg total) by mouth daily. Patient taking differently: Take 10 mg by mouth at bedtime.  05/31/18   Cameron Sprang, MD  glipiZIDE (GLUCOTROL XL) 5 MG 24 hr tablet Take 1 tablet (5 mg total) by mouth daily with breakfast. 05/31/18   Philemon Kingdom, MD  Insulin  Glargine (LANTUS SOLOSTAR) 100 UNIT/ML Solostar Pen Inject 20 Units into the skin at bedtime. 05/31/18   Philemon Kingdom, MD  Insulin Pen Needle 32G X 4 MM MISC Use 1x a day 05/31/18   Philemon Kingdom, MD  memantine (NAMENDA) 10 MG tablet TAKE 1 TABLET EVERY NIGHT 1 WEEK, THEN INCREASE TO 1 TABLET TWICE A DAY Patient taking differently: Take 10 mg by mouth 2 (two) times daily.  06/27/18   Cameron Sprang, MD  ONE TOUCH ULTRA TEST test strip USE AS INSTRUCTED 12/28/16   Noralee Space, MD  pantoprazole (PROTONIX) 40 MG tablet TAKE 1 TABLET (40 MG TOTAL) BY MOUTH DAILY. Patient taking differently: Take 40 mg by mouth daily.  03/15/17   Noralee Space, MD  saxagliptin HCl (ONGLYZA) 5 MG TABS tablet Take 1 tablet (5 mg total) by mouth daily. 02/02/17   Noralee Space, MD  sertraline (ZOLOFT) 50 MG tablet TAKE 1 TABLET BY MOUTH EVERYDAY AT BEDTIME Patient taking differently: Take 50 mg by mouth at bedtime.  03/21/18   Noralee Space, MD    Family History Family History  Problem Relation Age of Onset  .  Heart attack Father   . Dementia Mother   . Ovarian cancer Paternal Grandmother   . Breast cancer Sister   . Colon cancer Neg Hx   . Esophageal cancer Neg Hx   . Rectal cancer Neg Hx   . Stomach cancer Neg Hx     Social History Social History   Tobacco Use  . Smoking status: Never Smoker  . Smokeless tobacco: Never Used  Substance Use Topics  . Alcohol use: No    Alcohol/week: 0.0 standard drinks  . Drug use: No     Allergies   Dilaudid [hydromorphone hcl], Hydromorphone hcl, and Fentanyl   Review of Systems Review of Systems Level 5 caveat cannot obtain review of systems.  Physical Exam Updated Vital Signs BP (!) 172/67 (BP Location: Right Arm)   Pulse (!) 54   Temp 97.9 F (36.6 C) (Oral)   Resp 16   SpO2 100%   Physical Exam Constitutional:      Comments: Patient seems slightly restful but awakens to voice and interacts.  No respiratory distress.  Vital signs are stable.  HENT:     Head: Normocephalic and atraumatic.     Nose: Nose normal.     Mouth/Throat:     Mouth: Mucous membranes are moist.     Pharynx: Oropharynx is clear.  Eyes:     Extraocular Movements: Extraocular movements intact.     Pupils: Pupils are equal, round, and reactive to light.  Neck:     Musculoskeletal: Neck supple.  Cardiovascular:     Rate and Rhythm: Normal rate and regular rhythm.     Pulses: Normal pulses.     Heart sounds: Normal heart sounds.  Pulmonary:     Effort: Pulmonary effort is normal.     Breath sounds: Normal breath sounds.  Abdominal:     General: There is no distension.     Palpations: Abdomen is soft.     Tenderness: There is no abdominal tenderness. There is no guarding.  Musculoskeletal: Normal range of motion.        General: No swelling or tenderness.     Right lower leg: No edema.     Left lower leg: No edema.  Skin:    General: Skin is warm and dry.  Neurological:     Comments: Patient  is alert.  He is oriented to self.  He is not correctly  identify place or time.  Incorrectly identifies presidents Ida Rogue.  Patient will follow commands for grip strength bilaterally.  He has 5\5 upper extremity grip strength.  He can elevate and hold each lower extremity off of the bed independently.  Cranial nerves intact.  He does follow commands.  His speech is clear but slow and soft.  Many questions he does not know the answer.      ED Treatments / Results  Labs (all labs ordered are listed, but only abnormal results are displayed) Labs Reviewed  CBC - Abnormal; Notable for the following components:      Result Value   RBC 2.87 (*)    Hemoglobin 8.4 (*)    HCT 27.3 (*)    All other components within normal limits  DIFFERENTIAL - Abnormal; Notable for the following components:   Lymphs Abs 0.6 (*)    All other components within normal limits  COMPREHENSIVE METABOLIC PANEL - Abnormal; Notable for the following components:   CO2 21 (*)    Glucose, Bld 67 (*)    BUN 38 (*)    Creatinine, Ser 1.73 (*)    Total Protein 5.9 (*)    Albumin 3.1 (*)    GFR calc non Af Amer 37 (*)    GFR calc Af Amer 43 (*)    All other components within normal limits  URINALYSIS, ROUTINE W REFLEX MICROSCOPIC - Abnormal; Notable for the following components:   Color, Urine STRAW (*)    Protein, ur 30 (*)    Leukocytes,Ua TRACE (*)    All other components within normal limits  I-STAT CHEM 8, ED - Abnormal; Notable for the following components:   Chloride 112 (*)    BUN 37 (*)    Creatinine, Ser 1.70 (*)    Glucose, Bld 60 (*)    TCO2 21 (*)    Hemoglobin 9.2 (*)    HCT 27.0 (*)    All other components within normal limits  CBG MONITORING, ED - Abnormal; Notable for the following components:   Glucose-Capillary 43 (*)    All other components within normal limits  CBG MONITORING, ED - Abnormal; Notable for the following components:   Glucose-Capillary 124 (*)    All other components within normal limits  CBG MONITORING, ED - Abnormal;  Notable for the following components:   Glucose-Capillary 128 (*)    All other components within normal limits  PROTIME-INR  APTT  AMMONIA  CBG MONITORING, ED    EKG EKG Interpretation  Date/Time:  Saturday November 12 2018 14:44:58 EDT Ventricular Rate:  61 PR Interval:    QRS Duration: 82 QT Interval:  409 QTC Calculation: 412 R Axis:   -6 Text Interpretation:  Sinus rhythm normal, no change Confirmed by Charlesetta Shanks [phone]) on 11/12/2018 5:50:16 PM   Radiology Ct Head Wo Contrast  Result Date: 11/12/2018 CLINICAL DATA:  Headache. Acute onset of weakness and difficulty walking. EXAM: CT HEAD WITHOUT CONTRAST TECHNIQUE: Contiguous axial images were obtained from the base of the skull through the vertex without intravenous contrast. COMPARISON:  MRI 09/02/2018 and CT studies 09/02/2018 FINDINGS: Brain: Old infarctions affecting the cerebellum more extensive on the right than the left. No focal brainstem insult is seen. Cerebral hemispheres show old watershed distribution in the left hemisphere from front to back affecting the cortical and subcortical brain. No sign of acute infarction, mass lesion, hemorrhage, hydrocephalus or  extra-axial collection. Vascular: There is atherosclerotic calcification of the major vessels at the base of the brain. Skull: Negative Sinuses/Orbits: Clear/normal Other: None IMPRESSION: No acute finding by CT. Extensive old watershed infarctions in the left hemisphere. Old cerebellar infarctions right more than left. Extensive atherosclerotic calcification of the major vessels at the base of the brain. Electronically Signed   By: Nelson Chimes [name]   On: 11/12/2018 15:33    Procedures Procedures (including critical care time)  Medications Ordered in ED Medications  sodium chloride flush (NS) 0.9 % injection 3 mL (3 mLs Intravenous Given 11/12/18 1531)  dextrose 50 % solution 25 mL (25 mLs Intravenous Given 11/12/18 1624)     Initial Impression / Assessment and  Plan / ED Course  I have reviewed the triage vital signs and the nursing notes.  Pertinent labs & imaging results that were available during my care of the patient were reviewed by me and considered in my medical decision making (see chart for details).  Clinical Course as of Nov 12 1137  Sat Nov 12, 2018  1743 Patient's vital signs are stable.  He awakens to answer questions.  He however answers the year is 91.  He answers the month is July.  Ida Rogue for the president.  He reports he lives in [place].  He does not know a phone number for his wife or daughter.  I have attempted calls to both contacts listed in the demographics but both advised the numbers unavailable at this time.  Cannot leave a message.  Plan to attempt ambulation of the patient for gait stability and get orthostatics.  Patient was somewhat hypoglycemic.  Will have him eat.   [MP]    Clinical Course User Index [MP] Charlesetta Shanks, MD      Patient presents with a limited amount of history.  Diagnostic work-up is stable without any significant acute findings.  Patient has been mildly hypoglycemic.  This is responded to oral intake.  Additional history obtained by nursing staff from family ultimately contacted.  Their phone had been cut off and they called in.  Patient has some general weakness with increased difficulty standing from baseline and blood sugar was somewhat low at home.  EMS reported blood sugar 98.  No other significant acute changes.  Patient has had several rechecks by myself.  He awakens spontaneously at all times.  Nursing staff has gait tested.  He has been able to up and ambulate with a walker.  Final disposition per [facility].  He has reviewed this case with the patient's wife and I agree with plan of management.  Final Clinical Impressions(s) / ED Diagnoses   Final diagnoses:  Hypoglycemia  Dementia without behavioral disturbance, unspecified dementia type [facility])  General weakness    ED  Discharge Orders    None       Charlesetta Shanks, MD 11/13/18 1142

## 2018-11-12 NOTE — ED Provider Notes (Signed)
Patient signed out to me by B. Marelli, PA-C.  Please see previous notes for further history.  In brief, patient presenting for evaluation of generalized weakness, difficulty standing, and hypoglycemia.  Upon arrival in the ER, BGL 43.  He is feeling better after eating food and medications.  Labs are reassuring.  Initial plan was for PT/OT admission versus discharge with wife.  Wife states she is comfortable taking patient home.  Patient is ambulatory in the ER without any difficulty.  UA pending.  If negative, plan for discharge.  UA reassuring, doubt UTI.  Patient to be discharged.    Franchot Heidelberg, PA-C 11/12/18 2353    Fredia Sorrow, MD 11/18/18 1556

## 2018-11-12 NOTE — ED Provider Notes (Addendum)
Care handoff received from Dr. Vallery Ridge at shift change please see her note for full details.  In short patient with history of CVA, diabetes, seizures, dementia presents today by EMS.  There was some generalized weakness earlier today without pain, patient confused at baseline apparently however initially unclear whether worsened.  Difficulty contacting patient's family at first.  He was alert, oriented to self however not to time or place, equal grip strength bilaterally and follows commands cranial nerves intact, speech clear but slow.  CBG initially 43, he has been given dextrose and eaten in this ED and repeat CBG of 70 CMP with creatinine 1.7, appears baseline Ammonia within normal limit APTT within normal limit PT/INR within normal limits CBC with hemoglobin 8.4, appears baseline CT head:  IMPRESSION:  No acute finding by CT. Extensive old watershed infarctions in the  left hemisphere. Old cerebellar infarctions right more than left.  Extensive atherosclerotic calcification of the major vessels at the  base of the brain.   EKG reviewed by Dr. Vallery Ridge without acute findings Vital signs have remained stable throughout ER visit without fever, tachycardia, hypotension or hypoxia No orthostatic hypotension ------------------------------- At shift change nursing staff was able to contact the patient's family apparently patient is at his baseline and they were primarily concerned because he was having difficulty getting up earlier today, no change to mental status per family.  Patient has been ambulating around this emergency department with nursing staff without difficulty or assistance.    Discussion with Dr. Johnney Killian, plan of care at shift change was that if patient's family is able to come pick up the patient and felt comfortable taking care of him at home than this would be appropriate however if they do not feel comfortable taking care of the patient at home then he would need PT/OT eval  and observation in the hospital. Physical Exam  BP (!) 141/60   Pulse 60   Temp 97.9 F (36.6 C) (Oral)   Resp 18   SpO2 98%   Physical Exam Constitutional:      General: He is not in acute distress.    Appearance: Normal appearance. He is not ill-appearing or diaphoretic.  HENT:     Head: Normocephalic and atraumatic.     Right Ear: External ear normal.     Left Ear: External ear normal.     Nose: Nose normal.     Mouth/Throat:     Mouth: Mucous membranes are moist.  Eyes:     Extraocular Movements: Extraocular movements intact.     Conjunctiva/sclera: Conjunctivae normal.     Pupils: Pupils are equal, round, and reactive to light.  Neck:     Musculoskeletal: Normal range of motion and neck supple.  Cardiovascular:     Rate and Rhythm: Normal rate and regular rhythm.     Pulses: Normal pulses.     Heart sounds: Normal heart sounds.  Pulmonary:     Effort: Pulmonary effort is normal.     Breath sounds: Normal breath sounds.  Abdominal:     General: Abdomen is flat.     Tenderness: There is no abdominal tenderness. There is no guarding or rebound.  Musculoskeletal: Normal range of motion.  Skin:    General: Skin is warm and dry.     Capillary Refill: Capillary refill takes less than 2 seconds.  Neurological:     General: No focal deficit present.     Mental Status: He is alert.     GCS: GCS eye  subscore is 4. GCS verbal subscore is 5. GCS motor subscore is 6.     Comments: Speech clear obeys commands Major Cranial nerves without deficit, no facial droop Moves extremities bilaterally with coordination intact.  Psychiatric:        Mood and Affect: Mood normal.        Behavior: Behavior normal.    ED Course/Procedures   Clinical Course as of Nov 12 2051  Sat Nov 12, 2018  1743 Patient's vital signs are stable.  He awakens to answer questions.  He however answers the year is 12.  He answers the month is July.  Ida Rogue for the president.  He reports he lives  in [place].  He does not know a phone number for his wife or daughter.  I have attempted calls to both contacts listed in the demographics but both advised the numbers unavailable at this time.  Cannot leave a message.  Plan to attempt ambulation of the patient for gait stability and get orthostatics.  Patient was somewhat hypoglycemic.  Will have him eat.   [MP]    Clinical Course User Index [MP] Charlesetta Shanks, MD    Procedures  MDM  I reevaluated the patient he is sleeping comfortably in bed easily arousable to voice reports that he is feeling well and has no complaints. - Informed by nursing staff that patient's wife has arranged for family member to bring him home to help take care of him, patient is ambulatory around the ED with reassuring vital signs and has no complaints of pain he appears to be at his baseline.  Appropriate for discharge at this time with PCP follow-up.  Will advise patient's wife to ensure that he monitors his blood sugars regularly, eats and avoids dehydration. - I discussed plan of care with patient's wife over the phone [phone]) she reports she is about 30 minutes away from this hospital to pick him up.  I reviewed events that brought patient to the hospital today she reports that earlier today he had trouble getting out of bed and reports he had some generalized weakness bilaterally and that his blood sugar level had dropped into 50s, she then gave him food and EMS evaluated him, her concern is his hypoglycemia.  She denies any change in mental status or focal weakness during this event earlier today, no history of pain or recent infectious-like symptoms.  I discussed close monitoring of blood sugars and adequate p.o. intake with patient's wife and she stated understanding. ---------------------------------------------------- On review of work-up urinalysis was not collected, this is been added, pending no infectious-like findings patient to be discharged to  home with wife.  Care handoff given to [place] PA-C at shift change, plan of care is to follow-up on urinalysis, reassess and disposition.  Anticipate discharge potentially with antibiotics if UTI.  Note: Portions of this report may have been transcribed using voice recognition software. Every effort was made to ensure accuracy; however, inadvertent computerized transcription errors may still be present.   Deliah Boston, PA-C 11/12/18 2121    Deliah Boston, PA-C 11/12/18 2123    Fredia Sorrow, MD 11/18/18 [phone]

## 2018-11-12 NOTE — ED Notes (Signed)
Pt ambulated via walker with no issue. Pt ambulated approx. 50 ft.

## 2018-12-08 ENCOUNTER — Inpatient Hospital Stay (HOSPITAL_COMMUNITY)
Admission: EM | Admit: 2018-12-08 | Discharge: 2018-12-15 | DRG: 065 | Disposition: A | Discharge status: Hospice | Payer: Medicare Other | Attending: Internal Medicine | Admitting: Internal Medicine

## 2018-12-08 ENCOUNTER — Encounter (HOSPITAL_COMMUNITY): Payer: Self-pay | Admitting: Family Medicine

## 2018-12-08 ENCOUNTER — Other Ambulatory Visit: Payer: Self-pay

## 2018-12-08 ENCOUNTER — Emergency Department (HOSPITAL_COMMUNITY): Payer: Medicare Other

## 2018-12-08 DIAGNOSIS — I63512 Cerebral infarction due to unspecified occlusion or stenosis of left middle cerebral artery: Principal | ICD-10-CM | POA: Diagnosis present

## 2018-12-08 DIAGNOSIS — Z66 Do not resuscitate: Secondary | ICD-10-CM | POA: Diagnosis not present

## 2018-12-08 DIAGNOSIS — R29709 NIHSS score 9: Secondary | ICD-10-CM | POA: Diagnosis not present

## 2018-12-08 DIAGNOSIS — E785 Hyperlipidemia, unspecified: Secondary | ICD-10-CM | POA: Diagnosis present

## 2018-12-08 DIAGNOSIS — Z515 Encounter for palliative care: Secondary | ICD-10-CM | POA: Diagnosis not present

## 2018-12-08 DIAGNOSIS — E1151 Type 2 diabetes mellitus with diabetic peripheral angiopathy without gangrene: Secondary | ICD-10-CM | POA: Diagnosis not present

## 2018-12-08 DIAGNOSIS — G4733 Obstructive sleep apnea (adult) (pediatric): Secondary | ICD-10-CM | POA: Diagnosis present

## 2018-12-08 DIAGNOSIS — N183 Chronic kidney disease, stage 3 unspecified: Secondary | ICD-10-CM | POA: Diagnosis present

## 2018-12-08 DIAGNOSIS — F418 Other specified anxiety disorders: Secondary | ICD-10-CM

## 2018-12-08 DIAGNOSIS — Z8249 Family history of ischemic heart disease and other diseases of the circulatory system: Secondary | ICD-10-CM

## 2018-12-08 DIAGNOSIS — F0150 Vascular dementia without behavioral disturbance: Secondary | ICD-10-CM | POA: Diagnosis not present

## 2018-12-08 DIAGNOSIS — E1122 Type 2 diabetes mellitus with diabetic chronic kidney disease: Secondary | ICD-10-CM | POA: Diagnosis not present

## 2018-12-08 DIAGNOSIS — I63232 Cerebral infarction due to unspecified occlusion or stenosis of left carotid arteries: Secondary | ICD-10-CM | POA: Diagnosis not present

## 2018-12-08 DIAGNOSIS — Z794 Long term (current) use of insulin: Secondary | ICD-10-CM | POA: Diagnosis not present

## 2018-12-08 DIAGNOSIS — D649 Anemia, unspecified: Secondary | ICD-10-CM | POA: Diagnosis not present

## 2018-12-08 DIAGNOSIS — R1310 Dysphagia, unspecified: Secondary | ICD-10-CM | POA: Diagnosis present

## 2018-12-08 DIAGNOSIS — G8191 Hemiplegia, unspecified affecting right dominant side: Secondary | ICD-10-CM | POA: Diagnosis not present

## 2018-12-08 DIAGNOSIS — Z7189 Other specified counseling: Secondary | ICD-10-CM | POA: Diagnosis not present

## 2018-12-08 DIAGNOSIS — Z9049 Acquired absence of other specified parts of digestive tract: Secondary | ICD-10-CM

## 2018-12-08 DIAGNOSIS — I69351 Hemiplegia and hemiparesis following cerebral infarction affecting right dominant side: Secondary | ICD-10-CM

## 2018-12-08 DIAGNOSIS — D631 Anemia in chronic kidney disease: Secondary | ICD-10-CM | POA: Diagnosis present

## 2018-12-08 DIAGNOSIS — R4701 Aphasia: Secondary | ICD-10-CM | POA: Diagnosis present

## 2018-12-08 DIAGNOSIS — R270 Ataxia, unspecified: Secondary | ICD-10-CM | POA: Diagnosis not present

## 2018-12-08 DIAGNOSIS — E119 Type 2 diabetes mellitus without complications: Secondary | ICD-10-CM

## 2018-12-08 DIAGNOSIS — I69320 Aphasia following cerebral infarction: Secondary | ICD-10-CM | POA: Diagnosis not present

## 2018-12-08 DIAGNOSIS — R569 Unspecified convulsions: Secondary | ICD-10-CM

## 2018-12-08 DIAGNOSIS — I6522 Occlusion and stenosis of left carotid artery: Secondary | ICD-10-CM | POA: Diagnosis not present

## 2018-12-08 DIAGNOSIS — F0151 Vascular dementia with behavioral disturbance: Secondary | ICD-10-CM | POA: Diagnosis not present

## 2018-12-08 DIAGNOSIS — I700 Atherosclerosis of aorta: Secondary | ICD-10-CM | POA: Diagnosis not present

## 2018-12-08 DIAGNOSIS — I10 Essential (primary) hypertension: Secondary | ICD-10-CM

## 2018-12-08 DIAGNOSIS — N179 Acute kidney failure, unspecified: Secondary | ICD-10-CM | POA: Diagnosis not present

## 2018-12-08 DIAGNOSIS — F05 Delirium due to known physiological condition: Secondary | ICD-10-CM | POA: Diagnosis not present

## 2018-12-08 DIAGNOSIS — Z905 Acquired absence of kidney: Secondary | ICD-10-CM

## 2018-12-08 DIAGNOSIS — Z7982 Long term (current) use of aspirin: Secondary | ICD-10-CM | POA: Diagnosis not present

## 2018-12-08 DIAGNOSIS — Z7902 Long term (current) use of antithrombotics/antiplatelets: Secondary | ICD-10-CM

## 2018-12-08 DIAGNOSIS — I129 Hypertensive chronic kidney disease with stage 1 through stage 4 chronic kidney disease, or unspecified chronic kidney disease: Secondary | ICD-10-CM | POA: Diagnosis present

## 2018-12-08 DIAGNOSIS — Z20828 Contact with and (suspected) exposure to other viral communicable diseases: Secondary | ICD-10-CM | POA: Diagnosis present

## 2018-12-08 DIAGNOSIS — I6503 Occlusion and stenosis of bilateral vertebral arteries: Secondary | ICD-10-CM | POA: Diagnosis not present

## 2018-12-08 DIAGNOSIS — G40409 Other generalized epilepsy and epileptic syndromes, not intractable, without status epilepticus: Secondary | ICD-10-CM | POA: Diagnosis present

## 2018-12-08 DIAGNOSIS — Z79899 Other long term (current) drug therapy: Secondary | ICD-10-CM

## 2018-12-08 DIAGNOSIS — I639 Cerebral infarction, unspecified: Secondary | ICD-10-CM | POA: Diagnosis not present

## 2018-12-08 DIAGNOSIS — G9349 Other encephalopathy: Secondary | ICD-10-CM | POA: Diagnosis not present

## 2018-12-08 DIAGNOSIS — Z8673 Personal history of transient ischemic attack (TIA), and cerebral infarction without residual deficits: Secondary | ICD-10-CM | POA: Diagnosis not present

## 2018-12-08 HISTORY — DX: Chronic kidney disease, stage 3 unspecified: N18.30

## 2018-12-08 HISTORY — DX: Anemia, unspecified: D64.9

## 2018-12-08 HISTORY — DX: Other specified anxiety disorders: F41.8

## 2018-12-08 HISTORY — DX: Vascular dementia, unspecified severity, without behavioral disturbance, psychotic disturbance, mood disturbance, and anxiety: F01.50

## 2018-12-08 HISTORY — DX: Type 2 diabetes mellitus without complications: E11.9

## 2018-12-08 HISTORY — DX: Cerebral infarction, unspecified: I63.9

## 2018-12-08 HISTORY — DX: Essential (primary) hypertension: I10

## 2018-12-08 LAB — COMPREHENSIVE METABOLIC PANEL
ALT: 19 U/L (ref 0–44)
AST: 19 U/L (ref 15–41)
Albumin: 3.4 g/dL — ABNORMAL LOW (ref 3.5–5.0)
Alkaline Phosphatase: 86 U/L (ref 38–126)
Anion gap: 9 (ref 5–15)
BUN: 29 mg/dL — ABNORMAL HIGH (ref 8–23)
CO2: 22 mmol/L (ref 22–32)
Calcium: 9 mg/dL (ref 8.9–10.3)
Chloride: 108 mmol/L (ref 98–111)
Creatinine, Ser: 2.04 mg/dL — ABNORMAL HIGH (ref 0.61–1.24)
GFR calc Af Amer: 35 mL/min — ABNORMAL LOW (ref >60)
GFR calc non Af Amer: 30 mL/min — ABNORMAL LOW (ref >60)
Glucose, Bld: 277 mg/dL — ABNORMAL HIGH (ref 70–99)
Potassium: 4.4 mmol/L (ref 3.5–5.1)
Sodium: 139 mmol/L (ref 135–145)
Total Bilirubin: 0.5 mg/dL (ref 0.3–1.2)
Total Protein: 6.3 g/dL — ABNORMAL LOW (ref 6.5–8.1)

## 2018-12-08 LAB — CBC
HCT: 30.5 % — ABNORMAL LOW (ref 39.0–52.0)
Hemoglobin: 9.4 g/dL — ABNORMAL LOW (ref 13.0–17.0)
MCH: 29.8 pg (ref 26.0–34.0)
MCHC: 30.8 g/dL (ref 30.0–36.0)
MCV: 96.8 fL (ref 80.0–100.0)
Platelets: 236 10*3/uL (ref 150–400)
RBC: 3.15 MIL/uL — ABNORMAL LOW (ref 4.22–5.81)
RDW: 13.4 % (ref 11.5–15.5)
WBC: 7.8 10*3/uL (ref 4.0–10.5)
nRBC: 0 % (ref 0.0–0.2)

## 2018-12-08 LAB — I-STAT CHEM 8, ED
BUN: 29 mg/dL — ABNORMAL HIGH (ref 8–23)
Calcium, Ion: 1.25 mmol/L (ref 1.15–1.40)
Chloride: 108 mmol/L (ref 98–111)
Creatinine, Ser: 1.9 mg/dL — ABNORMAL HIGH (ref 0.61–1.24)
Glucose, Bld: 258 mg/dL — ABNORMAL HIGH (ref 70–99)
HCT: 30 % — ABNORMAL LOW (ref 39.0–52.0)
Hemoglobin: 10.2 g/dL — ABNORMAL LOW (ref 13.0–17.0)
Potassium: 4.5 mmol/L (ref 3.5–5.1)
Sodium: 142 mmol/L (ref 135–145)
TCO2: 21 mmol/L — ABNORMAL LOW (ref 22–32)

## 2018-12-08 LAB — DIFFERENTIAL
Abs Immature Granulocytes: 0.03 10*3/uL (ref 0.00–0.07)
Basophils Absolute: 0.1 10*3/uL (ref 0.0–0.1)
Basophils Relative: 1 %
Eosinophils Absolute: 0.2 10*3/uL (ref 0.0–0.5)
Eosinophils Relative: 2 %
Immature Granulocytes: 0 %
Lymphocytes Relative: 10 %
Lymphs Abs: 0.7 10*3/uL (ref 0.7–4.0)
Monocytes Absolute: 0.6 10*3/uL (ref 0.1–1.0)
Monocytes Relative: 8 %
Neutro Abs: 6.2 10*3/uL (ref 1.7–7.7)
Neutrophils Relative %: 79 %

## 2018-12-08 LAB — PROTIME-INR
INR: 1 (ref 0.8–1.2)
Prothrombin Time: 13.5 seconds (ref 11.4–15.2)

## 2018-12-08 LAB — CBG MONITORING, ED
Glucose-Capillary: 165 mg/dL — ABNORMAL HIGH (ref 70–99)
Glucose-Capillary: 107 mg/dL — ABNORMAL HIGH (ref 70–99)

## 2018-12-08 LAB — SARS CORONAVIRUS 2 BY RT PCR (HOSPITAL ORDER, PERFORMED IN ~~LOC~~ HOSPITAL LAB): SARS Coronavirus 2: NEGATIVE

## 2018-12-08 LAB — APTT: aPTT: 28 seconds (ref 24–36)

## 2018-12-08 MED ORDER — SODIUM CHLORIDE 0.9 % IV BOLUS
500.0000 mL | Freq: Once | INTRAVENOUS | Status: AC
  Administered 2018-12-08: 500 mL via INTRAVENOUS

## 2018-12-08 MED ORDER — SODIUM CHLORIDE 0.9% FLUSH
3.0000 mL | Freq: Once | INTRAVENOUS | Status: AC
Start: 2018-12-08 — End: 2018-12-08
  Administered 2018-12-08: 3 mL via INTRAVENOUS

## 2018-12-08 MED ORDER — ASPIRIN 300 MG RE SUPP
300.0000 mg | Freq: Every day | RECTAL | Status: DC
  Administered 2018-12-08 – 2018-12-09 (×2): 300 mg via RECTAL
  Filled 2018-12-08 (×2): qty 1

## 2018-12-08 MED ORDER — INSULIN GLARGINE 100 UNIT/ML ~~LOC~~ SOLN
7.0000 [IU] | Freq: Every day | SUBCUTANEOUS | Status: DC
  Administered 2018-12-08 – 2018-12-11 (×4): 7 [IU] via SUBCUTANEOUS
  Filled 2018-12-08 (×5): qty 0.07

## 2018-12-08 MED ORDER — LORAZEPAM 2 MG/ML IJ SOLN
INTRAMUSCULAR | Status: AC
  Administered 2018-12-08: 21:00:00 2 mg
  Filled 2018-12-08: qty 1

## 2018-12-08 MED ORDER — INSULIN ASPART 100 UNIT/ML ~~LOC~~ SOLN
0.0000 [IU] | SUBCUTANEOUS | Status: DC
  Administered 2018-12-08: 21:00:00 2 [IU] via SUBCUTANEOUS
  Administered 2018-12-09: 1 [IU] via SUBCUTANEOUS
  Administered 2018-12-10: 2 [IU] via SUBCUTANEOUS
  Administered 2018-12-10: 3 [IU] via SUBCUTANEOUS
  Administered 2018-12-10 (×2): 2 [IU] via SUBCUTANEOUS
  Administered 2018-12-10 – 2018-12-11 (×3): 3 [IU] via SUBCUTANEOUS
  Administered 2018-12-11: 04:00:00 5 [IU] via SUBCUTANEOUS
  Administered 2018-12-11 (×2): 7 [IU] via SUBCUTANEOUS
  Administered 2018-12-11: 12:00:00 5 [IU] via SUBCUTANEOUS
  Administered 2018-12-11: 20:00:00 3 [IU] via SUBCUTANEOUS
  Administered 2018-12-12 (×3): 2 [IU] via SUBCUTANEOUS

## 2018-12-08 MED ORDER — LABETALOL HCL 5 MG/ML IV SOLN: 10.0000 mg | INTRAVENOUS | Status: DC | PRN

## 2018-12-08 MED ORDER — LEVETIRACETAM IN NACL 1500 MG/100ML IV SOLN
1500.0000 mg | Freq: Once | INTRAVENOUS | Status: AC
  Administered 2018-12-08: 1500 mg via INTRAVENOUS
  Filled 2018-12-08: qty 100

## 2018-12-08 MED ORDER — LEVETIRACETAM IN NACL 500 MG/100ML IV SOLN
500.0000 mg | Freq: Two times a day (BID) | INTRAVENOUS | Status: DC
  Administered 2018-12-09 – 2018-12-14 (×12): 500 mg via INTRAVENOUS
  Filled 2018-12-08 (×12): qty 100

## 2018-12-08 MED ORDER — IOHEXOL 350 MG/ML SOLN
75.0000 mL | Freq: Once | INTRAVENOUS | Status: AC | PRN
  Administered 2018-12-08: 75 mL via INTRAVENOUS

## 2018-12-08 MED ORDER — SODIUM CHLORIDE 0.9 % IV BOLUS
1000.0000 mL | Freq: Once | INTRAVENOUS | Status: AC
  Administered 2018-12-08: 21:00:00 1000 mL via INTRAVENOUS

## 2018-12-08 NOTE — Progress Notes (Signed)
Reported by ER to have a GTCS for 30 sec.  Load with Keppra 1500 now IV. Continue Keppra 500 BID EEG in AM Rest per Dr. Yvetta Coder consult which is under way at the time of this note.   -- Amie Portland, MD [facility] Pager: [phone] If 7pm to 7am, please call on call as listed on AMION.

## 2018-12-08 NOTE — ED Provider Notes (Signed)
[facility] Provider Note   CSN: [id number] Arrival date & time: 12/08/18  1827  An emergency department physician performed an initial assessment on this suspected stroke patient at 1827.  History   Chief Complaint Chief Complaint  Patient presents with   Code Stroke    HPI Peter Nicholson is a 78 y.o. male with past medical history of diabetes, hypertension, recent admission for stroke who presents with new onset worsening of right-sided upper and lower extremity weakness as well as new onset receptive aphasia at 2 PM today.  Per wife, patient had right-sided weakness and expressive aphasia following stroke 6 weeks ago but was able to walk around the house.  Wife noticed that patient was having worsening right upper extremity weakness and right foot drag and was having difficulty understanding her.  Wife reports that patient takes aspirin, Plavix and statin daily. Wife reports chronic diarrhea for patient but denies nausea/vomiting or any other noted changes from baseline.  Patient has multiple charts     HPI  Past Medical History:  Diagnosis Date   CKD (chronic kidney disease), stage III ([place]) 12/08/2018   Depression with anxiety 12/08/2018   Essential hypertension 12/08/2018   Insulin-requiring or dependent type II diabetes mellitus ([place]) 12/08/2018   Ischemic stroke ([place]) 12/08/2018   Normocytic anemia 12/08/2018   Vascular dementia ([place]) 12/08/2018    Patient Active Problem List   Diagnosis Date Noted   Ischemic stroke ([place]) 12/08/2018   Insulin-requiring or dependent type II diabetes mellitus ([place]) 12/08/2018   Essential hypertension 12/08/2018   Vascular dementia ([place]) 12/08/2018   Depression with anxiety 12/08/2018   CKD (chronic kidney disease), stage III ([place]) 12/08/2018   Acute renal failure superimposed on stage 3 chronic kidney disease ([place]) 12/08/2018   Normocytic anemia 12/08/2018      Home Medications     Prior to Admission medications   Not on File    Family History History reviewed. No pertinent family history.  Social History Social History   Tobacco Use   Smoking status: Not on file  Substance Use Topics   Alcohol use: Not on file   Drug use: Not on file     Allergies   Patient has no allergy information on record.   Review of Systems Review of Systems  Unable to perform ROS: Patient nonverbal     Physical Exam Updated Vital Signs BP (!) 165/61 (BP Location: Right Arm)    Pulse 96    Temp 98.4 F (36.9 C) (Oral)    Resp 20    SpO2 99%   Physical Exam Constitutional:      Appearance: Normal appearance.  HENT:     Head: Normocephalic and atraumatic.     Right Ear: External ear normal.     Left Ear: External ear normal.  Neck:     Musculoskeletal: Neck supple.  Cardiovascular:     Rate and Rhythm: Normal rate and regular rhythm.     Heart sounds: Murmur present. No friction rub. No gallop.   Pulmonary:     Breath sounds: Normal breath sounds. No wheezing, rhonchi or rales.  Abdominal:     General: Abdomen is flat. There is no distension.     Palpations: Abdomen is soft.     Tenderness: There is no abdominal tenderness. There is no guarding.  Musculoskeletal:        General: No swelling or tenderness.  Skin:    General: Skin is warm and dry.  Neurological:     Mental Status: He is alert.     Comments: Neuro exam limited as patient has difficulty following commands. Patient follows some commands with visual prompting.  * Strength 3/5 in right upper and lower extremities, diminished compared to left * Right-sided facial droop  Psychiatric:        Mood and Affect: Mood normal.        Behavior: Behavior normal.      ED Treatments / Results  Labs (all labs ordered are listed, but only abnormal results are displayed) Labs Reviewed  CBC - Abnormal; Notable for the following components:      Result Value   RBC 3.15 (*)    Hemoglobin 9.4 (*)     HCT 30.5 (*)    All other components within normal limits  COMPREHENSIVE METABOLIC PANEL - Abnormal; Notable for the following components:   Glucose, Bld 277 (*)    BUN 29 (*)    Creatinine, Ser 2.04 (*)    Total Protein 6.3 (*)    Albumin 3.4 (*)    GFR calc non Af Amer 30 (*)    GFR calc Af Amer 35 (*)    All other components within normal limits  I-STAT CHEM 8, ED - Abnormal; Notable for the following components:   BUN 29 (*)    Creatinine, Ser 1.90 (*)    Glucose, Bld 258 (*)    TCO2 21 (*)    Hemoglobin 10.2 (*)    HCT 30.0 (*)    All other components within normal limits  SARS CORONAVIRUS 2 (HOSPITAL ORDER, [facility])  PROTIME-INR  APTT  DIFFERENTIAL  CBG MONITORING, ED    EKG None  Radiology Ct Angio Head W Or Wo Contrast  Result Date: 12/08/2018 CLINICAL DATA:  Right-sided weakness and aphasia EXAM: CT ANGIOGRAPHY HEAD AND NECK TECHNIQUE: Multidetector CT imaging of the head and neck was performed using the standard protocol during bolus administration of intravenous contrast. Multiplanar CT image reconstructions and MIPs were obtained to evaluate the vascular anatomy. Carotid stenosis measurements (when applicable) are obtained utilizing NASCET criteria, using the distal internal carotid diameter as the denominator. CONTRAST:  13mL OMNIPAQUE IOHEXOL 350 MG/ML SOLN COMPARISON:  CTA head neck 09/02/2018 FINDINGS: CTA NECK FINDINGS SKELETON: There is no bony spinal canal stenosis. No lytic or blastic lesion. OTHER NECK: Normal pharynx, larynx and major salivary glands. No cervical lymphadenopathy. Unremarkable thyroid gland. UPPER CHEST: No pneumothorax or pleural effusion. No nodules or masses. AORTIC ARCH: There is mild calcific atherosclerosis of the aortic arch. There is no aneurysm, dissection or hemodynamically significant stenosis of the visualized ascending aorta and aortic arch. Conventional 3 vessel aortic branching pattern. The visualized  proximal subclavian arteries are widely patent. RIGHT CAROTID SYSTEM: --Common carotid artery: Widely patent origin without common carotid artery dissection or aneurysm. --Internal carotid artery: No dissection, occlusion or aneurysm. Mild atherosclerotic calcification at the carotid bifurcation without hemodynamically significant stenosis. --External carotid artery: No acute abnormality. LEFT CAROTID SYSTEM: --Common carotid artery: Widely patent origin without common carotid artery dissection or aneurysm. Mixed calcified and non-calcified atherosclerotic disease at the bifurcation, extending into the internal carotid artery, resulting in less than 50% stenosis. --Internal carotid artery: There is severe long segment narrowing the left internal carotid artery with an angiographic string sign. However, this is unchanged compared to 09/02/2018. The ICA is patent to the skull base. There is near total loss of enhancement at the petrous segment. --External carotid  artery: No acute abnormality. VERTEBRAL ARTERIES: Left dominant configuration. Both origins are clearly patent. The right vertebral artery is occluded at the V3 V4 junction, unchanged. CTA HEAD FINDINGS POSTERIOR CIRCULATION: --Vertebral arteries: The right V4 segment is occluded. There is severe stenosis of the left V4 segment secondary to atherosclerotic calcification. --Posterior inferior cerebellar arteries (PICA): Both are patent --Anterior inferior cerebellar arteries (AICA): Patent origins from the basilar artery. --Basilar artery: Normal. --Superior cerebellar arteries: Normal. --Posterior cerebral arteries: The right PCA is diminutive, unchanged. Left PCA is normal. ANTERIOR CIRCULATION: --Intracranial internal carotid arteries: The left internal carotid artery is occluded over a short segment at the distal petrous and cavernous segments. The carotid terminus is patent but severely narrowed, unchanged. --Anterior cerebral arteries (ACA): Non  opacification of the left A1 segment, unchanged. Both distal anterior cerebral arteries are patent. The anterior communicating arteries patent. --Middle cerebral arteries (MCA): The right MCA is normal. Unchanged attenuated appearance of the left MCA without occlusion. VENOUS SINUSES: As permitted by contrast timing, patent. ANATOMIC VARIANTS: None Review of the MIP images confirms the above findings. IMPRESSION: 1. No emergent large vessel occlusion. 2. Unchanged severe long segment stenosis of the left internal carotid artery with areas of complete loss of enhancement at the skull base. 3. Unchanged occlusion of the right vertebral artery V4 segment and severe stenosis of the left V4 segment. 4. Unchanged narrowed left middle cerebral artery without occlusion. 5.  Aortic atherosclerosis (ICD10-I70.0). 6. These results were called by telephone at the time of interpretation on 12/08/2018 at 7:35 pm to Dr. Kerney Elbe , who verbally acknowledged these results. Electronically Signed   By: Ulyses Jarred [name]   On: 12/08/2018 19:51   Ct Head Code Stroke Wo Contrast  Result Date: 12/08/2018 CLINICAL DATA:  Code stroke. Ataxia. Right-sided weakness. Aphasia. Last seen normal 1400 hours. EXAM: CT HEAD WITHOUT CONTRAST TECHNIQUE: Contiguous axial images were obtained from the base of the skull through the vertex without intravenous contrast. COMPARISON:  11/12/2018 FINDINGS: Brain: Chronic small-vessel changes affect the pons. Old cerebellar infarctions right more than left. Old cortical and subcortical infarction in the left cerebral hemisphere in a watershed distribution and affecting the left parietal brain. Chronic small-vessel changes affect the white matter. I think there is some acute or subacute extension of the infarction in the left parietal lobe posterior to the region previously seen affected. No sign of mass lesion, hemorrhage, hydrocephalus or extra-axial collection. Vascular: There is atherosclerotic  calcification of the major vessels at the base of the brain. Skull: Negative Sinuses/Orbits: Clear/normal Other: None ASPECTS ([place] Stroke Program Early CT Score) - Ganglionic level infarction (caudate, lentiform nuclei, internal capsule, insula, M1-M3 cortex): 7 - Supraganglionic infarction (M4-M6 cortex): 2 Total score (0-10 with 10 being normal): 9, allowing for the chronic changes. IMPRESSION: 1. Acute or subacute extension of infarction in the left parietal lobe posterior to the region previously affected. No evidence of hemorrhage or mass effect. 2. ASPECTS is difficult to apply in the setting of the extensive old infarctions, but the acute changes affect 1 MCA sector. 3. These results were communicated to Dr. Cheral Marker at [facility] 8/27/2020by text page via the [facility] messaging system. Electronically Signed   By: Nelson Chimes [name]   On: 12/08/2018 18:48    Procedures Procedures (including critical care time)  Medications Ordered in ED Medications  sodium chloride flush (NS) 0.9 % injection 3 mL (has no administration in time range)  sodium chloride 0.9 % bolus 1,000 mL (has  no administration in time range)  iohexol (OMNIPAQUE) 350 MG/ML injection 75 mL (75 mLs Intravenous Contrast Given 12/08/18 1919)     Initial Impression / Assessment and Plan / ED Course  I have reviewed the triage vital signs and the nursing notes.  Pertinent labs & imaging results that were available during my care of the patient were reviewed by me and considered in my medical decision making (see chart for details).        78 year old male who presents with acute on chronic right-sided weakness and new onset receptive aphasia.  Patient with recent stroke 6 weeks ago and residual deficits of right-sided weakness and expressive aphasia.  CT head shows acute or subacute extension of infarction left parietal lobe posterior to previously affected region.  CT angiogram of head and neck does not show any emergent large  vessel occlusion and demonstrates several previously noted stenosis and occlusions.  Patient is on aspirin, Plavix, and statin.  Patient will require hospitalization for optimization of medical management and ST/OT/PT.  CMP reveals elevated glucose of 277 and creatinine of 2.04 up from baseline of 1.70 earlier this month.  Hemoglobin of 9.4 near baseline of 9.2 earlier this month.  Patient given 1 L normal saline. Patient with elevated blood pressure to 165/61, will hold home BP meds given possible acute stroke.  Discussed case with [facility] hospitalist, Dr. Myna Hidalgo for admission  Final Clinical Impressions(s) / ED Diagnoses   Final diagnoses:  None    ED Discharge Orders    None       Jeanmarie Hubert, MD 12/08/18 2019    Rex Kras, Wenda Overland, MD 12/10/18 1000

## 2018-12-08 NOTE — ED Notes (Signed)
Seizure x2.Marland KitchenMarland KitchenMD aware, oxygen/suction set up at bedside, 2mg  ativan IV given.

## 2018-12-08 NOTE — ED Notes (Signed)
Activated code stroke on pt

## 2018-12-08 NOTE — Consult Note (Signed)
NEURO HOSPITALIST CONSULT NOTE   Requestig physician: Dr. Rex Kras  Reason for Consult: Acute onset of aphasia  History obtained from:  Wife  HPI:                                                                                                                                          Peter Nicholson is an 78 y.o. male who was witnessed by wife to suddenly not be able to speak at 2:00 PM today. He had a left MCA stroke 6 weeks ago per his wife.  Code Stroke was activated. He underwent CT head with CTA of head and neck. He exhibited dense expressive and receptive aphasia, representing a significant worsening from his post-stroke baseline of expressive aphasia with relatively retained comprehension per wife. The patient also has a history of seizures. He has not exhibited seizure activity with onset of his new speech deficit, per wife. He takes ASA, Plavix and a statin at home.  PMHx   No family history on file.            Social History:  has no history on file for tobacco, alcohol, and drug.  Not on File  HOME MEDICATIONS:                                                                                                                     List unavailable in old chart. Awaiting Pharmacy assistance in obtaining current list.    ROS:                                                                                                                                       Unable to obtain due  to aphasia.    There were no vitals taken for this visit.  BP (!) 165/61 (BP Location: Right Arm)   Pulse 96   Temp 98.4 F (36.9 C) (Oral)   Resp 20   SpO2 99%    General Examination:                                                                                                       Physical Exam  HEENT-  [place]/AT  Lungs-Respirations unlabored Extremities- Warm and well perfused   Neurological Examination Mental Status: Awake and alert. Dense receptive and expressive aphasia.  Unable to follow verbal commands or repeat words. Unintelligible, hypophonic mono and disyllabic utterances. Appears confused when asked to name objects. Will follow some pantomimed commands.  Cranial Nerves: II: Decreased blink to threat on the right. Intact blink to threat on the left.  PERRL. Makes eye contact normally.  III,IV, VI: No ptosis. Able to gaze to left and right, but with more difficulty towards the right.  V,VII: Right facial droop. Reacts to tactile stimuli bilaterally VIII: hearing cannot be tested IX,X: Does not open mouth to command XI: Head is midline.  XII: Does not protrude tongue to command Motor: RUE and RLE 4/5 weakness noted. LUE and LLE 5/5 Sensory: Decreased reactivity to right sided tactile stimuli Deep Tendon Reflexes: Normoactive without gross asymmetry Cerebellar: Unable to follow commands.  Gait: Deferred  NIHSS: 9   Lab Results: Basic Metabolic Panel: No results for input(s): NA, K, CL, CO2, GLUCOSE, BUN, CREATININE, CALCIUM, MG, PHOS in the last 168 hours.  CBC: No results for input(s): WBC, NEUTROABS, HGB, HCT, MCV, PLT in the last 168 hours.  Cardiac Enzymes: No results for input(s): CKTOTAL, CKMB, CKMBINDEX, TROPONINI in the last 168 hours.  Lipid Panel: No results for input(s): CHOL, TRIG, HDL, CHOLHDL, VLDL, LDLCALC in the last 168 hours.  Imaging: No results found.   Assessment: 78 year old male with stroke manifesting as right sided weakness 6 weeks ago, re-presenting to the ED with acute onset dense receptive and expressive aphasia. 1. Exam best localizes to the left perisylvian cortices. Most likely an extension of his prior stroke. Also with chronic right motor deficits. 2. Not a tPA candidate due to recent large left hemispheric stroke with increased risk of hemorrhagic conversion, as well as being out of the 4.5 hour time window 3. Not a VIR candidate due to no LVO on CTA 4. Severe atherosclerotic disease on CTA is unchanged from  prior 5. CT head reveals extension of prior left MCA stroke.   Recommendations: 1. HgbA1c, fasting lipid panel 2. MRI of the brain without contrast 3. PT consult, OT consult, Speech consult 4. Echocardiogram 5. Continue statin 6. Continue ASA and Plavix  7. Risk factor modification 8. Telemetry monitoring 9. Frequent neuro checks 10 NPO until passes stroke swallow screen 11. Modified permissive HTN protocol due to advanced age/frailty. Treat if SBP > 180.  11 Please page stroke NP  Or  PA  Or MD from 8am -4 pm  as this patient from this  time will be  followed by the stroke.   You can look them up on [email or web address]  Password [email or web address]     Electronically signed: Dr. Kerney Elbe 12/08/2018, 6:44 PM

## 2018-12-08 NOTE — H&P (Addendum)
History and Physical    Peter Nicholson [id number] DOB: May 06, 1940 DOA: 12/08/2018  PCP: Eulas Post, MD   Patient coming from: Home   Chief Complaint: Increased right-sided weakness and aphasia    HPI: Peter Nicholson is a 78 y.o. male with medical history significant for hypertension, insulin-dependent diabetes mellitus, chronic kidney disease stage III, dementia, depression and anxiety, and history of CVA, now presenting to the emergency department for evaluation of worsened right-sided weakness and speech difficulty. History limited by patient's clinical condition. His wife noted acute-onset aphasia and increased difficulty walking. There had not been any seizure-like activity at home.   ED Course: Upon arrival to the ED, patient is found to be afebrile, saturating well on room air, and with remaining vitals also stable.  Chemistry panel is notable for a glucose of 227 and creatinine 2.04, up from an apparent baseline of 1.6 or 1.7.  CBC is notable for a stable normocytic anemia.  CT head is concerning for acute or subacute extension of infarction in the left parietal lobe posterior to the region previously affected without evidence for hemorrhage or mass-effect.  No emergent large vessel occlusion is seen on CTA which is notable for unchanged severe stenosis of the left internal carotid artery.  Patient was given a liter of normal saline in the emergency department, neurology is consulting, and hospitalists asked to admit.   Review of Systems:  Unable to complete ROS secondary to the patient's clinical condition.  Past Medical History:  Diagnosis Date   CKD (chronic kidney disease), stage III ([place]) 12/08/2018   Depression with anxiety 12/08/2018   Essential hypertension 12/08/2018   Insulin-requiring or dependent type II diabetes mellitus ([place]) 12/08/2018   Ischemic stroke ([place]) 12/08/2018   Normocytic anemia 12/08/2018   Vascular dementia ([place]) 12/08/2018    Past Surgical  History:  Procedure Laterality Date   CAROTID ENDARTERECTOMY     CHOLECYSTECTOMY     NEPHRECTOMY       reports that he has never smoked. He has never used smokeless tobacco. He reports previous alcohol use. He reports that he does not use drugs.   Family History  Problem Relation Age of Onset   Heart attack Father      Prior to Admission medications   Not on File    Physical Exam: Vitals:   12/08/18 2020 12/08/18 2021 12/08/18 2022 12/08/18 2023  BP:      Pulse: 79 88 79 76  Resp: 20 (!) 21 (!) 21 20  Temp:      TempSrc:      SpO2: 99% 100% 100% 100%    Constitutional: NAD, lethargic  Eyes: PERTLA, lids and conjunctivae normal ENMT: Mucous membranes are moist. Posterior pharynx clear of any exudate or lesions.   Neck: normal, supple, no masses, no thyromegaly Respiratory: no wheezing, no crackles. Normal respiratory effort. No accessory muscle use.  Cardiovascular: S1 & S2 heard, regular rate and rhythm. No extremity edema.   Abdomen: No distension, no tenderness, soft. Bowel sounds active.  Musculoskeletal: no clubbing / cyanosis. No joint deformity upper and lower extremities.    Skin: no significant rashes, lesions, ulcers. Warm, dry, well-perfused. Neurologic: No facial asymmetry. Lethargic. Opens eyes to loud voice. Localizes pain. No verbalizations. Moving all extremities spontaneously.     Labs on Admission: I have personally reviewed following labs and imaging studies  CBC: Recent Labs  Lab 12/08/18 1827 12/08/18 1855  WBC 7.8  --   NEUTROABS 6.2  --  HGB 9.4* 10.2*  HCT 30.5* 30.0*  MCV 96.8  --   PLT 236  --    Basic Metabolic Panel: Recent Labs  Lab 12/08/18 1827 12/08/18 1855  NA 139 142  K 4.4 4.5  CL 108 108  CO2 22  --   GLUCOSE 277* 258*  BUN 29* 29*  CREATININE 2.04* 1.90*  CALCIUM 9.0  --    GFR: CrCl cannot be calculated (Unknown ideal weight.). Liver Function Tests: Recent Labs  Lab 12/08/18 1827  AST 19  ALT 19    ALKPHOS 86  BILITOT 0.5  PROT 6.3*  ALBUMIN 3.4*   No results for input(s): LIPASE, AMYLASE in the last 168 hours. No results for input(s): AMMONIA in the last 168 hours. Coagulation Profile: Recent Labs  Lab 12/08/18 1827  INR 1.0   Cardiac Enzymes: No results for input(s): CKTOTAL, CKMB, CKMBINDEX, TROPONINI in the last 168 hours. BNP (last 3 results) No results for input(s): PROBNP in the last 8760 hours. HbA1C: No results for input(s): HGBA1C in the last 72 hours. CBG: No results for input(s): GLUCAP in the last 168 hours. Lipid Profile: No results for input(s): CHOL, HDL, LDLCALC, TRIG, CHOLHDL, LDLDIRECT in the last 72 hours. Thyroid Function Tests: No results for input(s): TSH, T4TOTAL, FREET4, T3FREE, THYROIDAB in the last 72 hours. Anemia Panel: No results for input(s): VITAMINB12, FOLATE, FERRITIN, TIBC, IRON, RETICCTPCT in the last 72 hours. Urine analysis: No results found for: COLORURINE, APPEARANCEUR, LABSPEC, PHURINE, GLUCOSEU, HGBUR, BILIRUBINUR, KETONESUR, PROTEINUR, UROBILINOGEN, NITRITE, LEUKOCYTESUR Sepsis Labs: @LABRCNTIP (procalcitonin:4,lacticidven:4) )No results found for this or any previous visit (from the past 240 hour(s)).   Radiological Exams on Admission: Ct Angio Head W Or Wo Contrast  Result Date: 12/08/2018 CLINICAL DATA:  Right-sided weakness and aphasia EXAM: CT ANGIOGRAPHY HEAD AND NECK TECHNIQUE: Multidetector CT imaging of the head and neck was performed using the standard protocol during bolus administration of intravenous contrast. Multiplanar CT image reconstructions and MIPs were obtained to evaluate the vascular anatomy. Carotid stenosis measurements (when applicable) are obtained utilizing NASCET criteria, using the distal internal carotid diameter as the denominator. CONTRAST:  24mL OMNIPAQUE IOHEXOL 350 MG/ML SOLN COMPARISON:  CTA head neck 09/02/2018 FINDINGS: CTA NECK FINDINGS SKELETON: There is no bony spinal canal stenosis. No  lytic or blastic lesion. OTHER NECK: Normal pharynx, larynx and major salivary glands. No cervical lymphadenopathy. Unremarkable thyroid gland. UPPER CHEST: No pneumothorax or pleural effusion. No nodules or masses. AORTIC ARCH: There is mild calcific atherosclerosis of the aortic arch. There is no aneurysm, dissection or hemodynamically significant stenosis of the visualized ascending aorta and aortic arch. Conventional 3 vessel aortic branching pattern. The visualized proximal subclavian arteries are widely patent. RIGHT CAROTID SYSTEM: --Common carotid artery: Widely patent origin without common carotid artery dissection or aneurysm. --Internal carotid artery: No dissection, occlusion or aneurysm. Mild atherosclerotic calcification at the carotid bifurcation without hemodynamically significant stenosis. --External carotid artery: No acute abnormality. LEFT CAROTID SYSTEM: --Common carotid artery: Widely patent origin without common carotid artery dissection or aneurysm. Mixed calcified and non-calcified atherosclerotic disease at the bifurcation, extending into the internal carotid artery, resulting in less than 50% stenosis. --Internal carotid artery: There is severe long segment narrowing the left internal carotid artery with an angiographic string sign. However, this is unchanged compared to 09/02/2018. The ICA is patent to the skull base. There is near total loss of enhancement at the petrous segment. --External carotid artery: No acute abnormality. VERTEBRAL ARTERIES: Left dominant configuration. Both origins are  clearly patent. The right vertebral artery is occluded at the V3 V4 junction, unchanged. CTA HEAD FINDINGS POSTERIOR CIRCULATION: --Vertebral arteries: The right V4 segment is occluded. There is severe stenosis of the left V4 segment secondary to atherosclerotic calcification. --Posterior inferior cerebellar arteries (PICA): Both are patent --Anterior inferior cerebellar arteries (AICA): Patent  origins from the basilar artery. --Basilar artery: Normal. --Superior cerebellar arteries: Normal. --Posterior cerebral arteries: The right PCA is diminutive, unchanged. Left PCA is normal. ANTERIOR CIRCULATION: --Intracranial internal carotid arteries: The left internal carotid artery is occluded over a short segment at the distal petrous and cavernous segments. The carotid terminus is patent but severely narrowed, unchanged. --Anterior cerebral arteries (ACA): Non opacification of the left A1 segment, unchanged. Both distal anterior cerebral arteries are patent. The anterior communicating arteries patent. --Middle cerebral arteries (MCA): The right MCA is normal. Unchanged attenuated appearance of the left MCA without occlusion. VENOUS SINUSES: As permitted by contrast timing, patent. ANATOMIC VARIANTS: None Review of the MIP images confirms the above findings. IMPRESSION: 1. No emergent large vessel occlusion. 2. Unchanged severe long segment stenosis of the left internal carotid artery with areas of complete loss of enhancement at the skull base. 3. Unchanged occlusion of the right vertebral artery V4 segment and severe stenosis of the left V4 segment. 4. Unchanged narrowed left middle cerebral artery without occlusion. 5.  Aortic atherosclerosis (ICD10-I70.0). 6. These results were called by telephone at the time of interpretation on 12/08/2018 at 7:35 pm to Dr. Kerney Elbe , who verbally acknowledged these results. Electronically Signed   By: Ulyses Jarred [name]   On: 12/08/2018 19:51   Ct Head Code Stroke Wo Contrast  Result Date: 12/08/2018 CLINICAL DATA:  Code stroke. Ataxia. Right-sided weakness. Aphasia. Last seen normal 1400 hours. EXAM: CT HEAD WITHOUT CONTRAST TECHNIQUE: Contiguous axial images were obtained from the base of the skull through the vertex without intravenous contrast. COMPARISON:  11/12/2018 FINDINGS: Brain: Chronic small-vessel changes affect the pons. Old cerebellar infarctions  right more than left. Old cortical and subcortical infarction in the left cerebral hemisphere in a watershed distribution and affecting the left parietal brain. Chronic small-vessel changes affect the white matter. I think there is some acute or subacute extension of the infarction in the left parietal lobe posterior to the region previously seen affected. No sign of mass lesion, hemorrhage, hydrocephalus or extra-axial collection. Vascular: There is atherosclerotic calcification of the major vessels at the base of the brain. Skull: Negative Sinuses/Orbits: Clear/normal Other: None ASPECTS ([place] Stroke Program Early CT Score) - Ganglionic level infarction (caudate, lentiform nuclei, internal capsule, insula, M1-M3 cortex): 7 - Supraganglionic infarction (M4-M6 cortex): 2 Total score (0-10 with 10 being normal): 9, allowing for the chronic changes. IMPRESSION: 1. Acute or subacute extension of infarction in the left parietal lobe posterior to the region previously affected. No evidence of hemorrhage or mass effect. 2. ASPECTS is difficult to apply in the setting of the extensive old infarctions, but the acute changes affect 1 MCA sector. 3. These results were communicated to Dr. Cheral Marker at [facility] 8/27/2020by text page via the [facility] messaging system. Electronically Signed   By: Nelson Chimes [name]   On: 12/08/2018 18:48    EKG: Independently reviewed. Sinus rhythm, rate 82, QTc 428 ms.   Assessment/Plan   1. Ischemic CVA, right sided weakness and aphasia  - Presents with aphasia and increased weakness on the right, LKN ~14:00 on day of presentation  - CT head is concerning for acute of  subacute extension of infarction in left parietal lobe without hemorrhage or mass effect  - No LVO on CTA head and neck which is notable for unchanged severe left ICA stenosis  - Neurology consulting and much appreciated  - Continue neuro checks, consult PT/OT/SLP  - Check MRI brain, echocardiogram, A1c, and fasting  lipids  - Can give ASA PR for now, resume high-intensity statin once he is appropriate for oral medications   ADDENDUM: patient has been restless, pulling out IV's, trying to get OOB, posing a danger to himself. He is not able to voice any specific complaint, is not in any resp distress and vitals remain normal. He has not been redirectable, and with concern for patient injuring himself, will start low-dose Haldol, request sitter, use soft restraints for now.    2. Acute kidney injury superimposed on CKD stage III  - SCr is 2.04 on admission, up from an apparent baseline of 1.6   - He was given a liter of NS in ED  - Check urine chemistries, renally-dose medications, avoid nephrotoxins, and repeat chem panel in am    3. Insulin-dependent DM  - A1c was 7.1% in May 2020  - Managed with Lantus, glipizide, and Onglyza at home  - Check CBG's, continue Lantus and use a low-intensity SSI with Novolog while in hospital    4. Hypertension  - Permit HTN for now    5. Depression, anxiety  - Patient postictal and oral medications held for now    6. Dementia  - Resume Aricept once he can pass swallow screen   7. Anemia  - Hgb is 9.4 on admission, similar to priors  - Likely related to CKD    8. Seizure  - Patient had a generalized seizure in ED treated with Ativan 1 mg  - Neurology is loading with 1500 mg Keppra and obtaining EEG  - Continue seizure precautions, Keppra 500 mg BID    PPE: Mask, face shield  DVT prophylaxis: sq heparin  Code Status: Full  Family Communication: Discussed with patient's wife at bedside Consults called: Neurology  Admission status: Observation     Vianne Bulls, MD [facility] Pager [phone]  If 7PM-7AM, please contact night-coverage [email or web address] Password [email or web address]  12/08/2018, 8:30 PM

## 2018-12-08 NOTE — ED Notes (Signed)
ED TO INPATIENT HANDOFF REPORT  ED Nurse Name and Phone #:  [phone]  S Name/Age/Gender Delray Alt 78 y.o. male Room/Bed: 027C/027C  Code Status   Code Status: Not on file  Home/SNF/Other Home Patient oriented to: self Is this baseline? Yes   Triage Complete: Triage complete  Chief Complaint Code stroke  Triage Note Pt arrives from home with stroke like symptoms. Left sided deficits and aphasia. Pt here 6 weeks ago for same. LKW 1400 today.   Allergies Not on File  Level of Care/Admitting Diagnosis ED Disposition    ED Disposition Condition [facility] Area: [place] [[id number]]  Level of Care: Telemetry Medical [104]  I expect the patient will be discharged within 24 hours: Yes  LOW acuity---Tx typically complete <24 hrs---ACUTE conditions typically can be evaluated <24 hours---LABS likely to return to acceptable levels <24 hours---IS near functional baseline---EXPECTED to return to current living arrangement---NOT newly hypoxic: Does not meet criteria for 5C-Observation unit  Covid Evaluation: Asymptomatic Screening Protocol (No Symptoms)  Diagnosis: Ischemic stroke Patient Partners [id number]  Admitting Physician: Vianne Bulls [id number]  Attending Physician: Vianne Bulls [id number]  PT Class (Do Not Modify): Observation [104]  PT Acc Code (Do Not Modify): Observation [10022]       B Medical/Surgery History Past Medical History:  Diagnosis Date  . CKD (chronic kidney disease), stage III ([place]) 12/08/2018  . Depression with anxiety 12/08/2018  . Essential hypertension 12/08/2018  . Insulin-requiring or dependent type II diabetes mellitus ([place]) 12/08/2018  . Ischemic stroke ([place]) 12/08/2018  . Normocytic anemia 12/08/2018  . Vascular dementia ([place]) 12/08/2018   Past Surgical History:  Procedure Laterality Date  . CAROTID ENDARTERECTOMY    . CHOLECYSTECTOMY    . NEPHRECTOMY       A IV Location/Drains/Wounds Patient  Lines/Drains/Airways Status   Active Line/Drains/Airways    None          Intake/Output Last 24 hours No intake or output data in the 24 hours ending 12/08/18 2042  Labs/Imaging Results for orders placed or performed during the hospital encounter of 12/08/18 (from the past 48 hour(s))  Protime-INR     Status: None   Collection Time: 12/08/18  6:27 PM  Result Value Ref Range   Prothrombin Time 13.5 11.4 - 15.2 seconds   INR 1.0 0.8 - 1.2    Comment: (NOTE) INR goal varies based on device and disease states. Performed at [facility], [place] [address], [place], [place] [address]   APTT     Status: None   Collection Time: 12/08/18  6:27 PM  Result Value Ref Range   aPTT 28 24 - 36 seconds    Comment: Performed at [facility] [address] [place], [place] [address]  CBC     Status: Abnormal   Collection Time: 12/08/18  6:27 PM  Result Value Ref Range   WBC 7.8 4.0 - 10.5 K/uL   RBC 3.15 (L) 4.22 - 5.81 MIL/uL   Hemoglobin 9.4 (L) 13.0 - 17.0 g/dL   HCT 30.5 (L) 39.0 - 52.0 %   MCV 96.8 80.0 - 100.0 fL   MCH 29.8 26.0 - 34.0 pg   MCHC 30.8 30.0 - 36.0 g/dL   RDW 13.4 11.5 - 15.5 %   Platelets 236 150 - 400 K/uL   nRBC 0.0 0.0 - 0.2 %    Comment: Performed at [facility], [place],  [place] [address]  Differential     Status: None   Collection Time: 12/08/18  6:27 PM  Result Value Ref Range   Neutrophils Relative % 79 %   Neutro Abs 6.2 1.7 - 7.7 K/uL   Lymphocytes Relative 10 %   Lymphs Abs 0.7 0.7 - 4.0 K/uL   Monocytes Relative 8 %   Monocytes Absolute 0.6 0.1 - 1.0 K/uL   Eosinophils Relative 2 %   Eosinophils Absolute 0.2 0.0 - 0.5 K/uL   Basophils Relative 1 %   Basophils Absolute 0.1 0.0 - 0.1 K/uL   Immature Granulocytes 0 %   Abs Immature Granulocytes 0.03 0.00 - 0.07 K/uL    Comment: Performed at [facility] [address] [place], [place] [address]  Comprehensive metabolic panel     Status: Abnormal    Collection Time: 12/08/18  6:27 PM  Result Value Ref Range   Sodium 139 135 - 145 mmol/L   Potassium 4.4 3.5 - 5.1 mmol/L   Chloride 108 98 - 111 mmol/L   CO2 22 22 - 32 mmol/L   Glucose, Bld 277 (H) 70 - 99 mg/dL   BUN 29 (H) 8 - 23 mg/dL   Creatinine, Ser 2.04 (H) 0.61 - 1.24 mg/dL   Calcium 9.0 8.9 - 10.3 mg/dL   Total Protein 6.3 (L) 6.5 - 8.1 g/dL   Albumin 3.4 (L) 3.5 - 5.0 g/dL   AST 19 15 - 41 U/L   ALT 19 0 - 44 U/L   Alkaline Phosphatase 86 38 - 126 U/L   Total Bilirubin 0.5 0.3 - 1.2 mg/dL   GFR calc non Af Amer 30 (L) >60 mL/min   GFR calc Af Amer 35 (L) >60 mL/min   Anion gap 9 5 - 15    Comment: Performed at [facility] [address], [place], [place] [address]  I-stat chem 8, ED     Status: Abnormal   Collection Time: 12/08/18  6:55 PM  Result Value Ref Range   Sodium 142 135 - 145 mmol/L   Potassium 4.5 3.5 - 5.1 mmol/L   Chloride 108 98 - 111 mmol/L   BUN 29 (H) 8 - 23 mg/dL   Creatinine, Ser 1.90 (H) 0.61 - 1.24 mg/dL   Glucose, Bld 258 (H) 70 - 99 mg/dL   Calcium, Ion 1.25 1.15 - 1.40 mmol/L   TCO2 21 (L) 22 - 32 mmol/L   Hemoglobin 10.2 (L) 13.0 - 17.0 g/dL   HCT 30.0 (L) 39.0 - 52.0 %   Ct Angio Head W Or Wo Contrast  Result Date: 12/08/2018 CLINICAL DATA:  Right-sided weakness and aphasia EXAM: CT ANGIOGRAPHY HEAD AND NECK TECHNIQUE: Multidetector CT imaging of the head and neck was performed using the standard protocol during bolus administration of intravenous contrast. Multiplanar CT image reconstructions and MIPs were obtained to evaluate the vascular anatomy. Carotid stenosis measurements (when applicable) are obtained utilizing NASCET criteria, using the distal internal carotid diameter as the denominator. CONTRAST:  74mL OMNIPAQUE IOHEXOL 350 MG/ML SOLN COMPARISON:  CTA head neck 09/02/2018 FINDINGS: CTA NECK FINDINGS SKELETON: There is no bony spinal canal stenosis. No lytic or blastic lesion. OTHER NECK: Normal pharynx, larynx and major  salivary glands. No cervical lymphadenopathy. Unremarkable thyroid gland. UPPER CHEST: No pneumothorax or pleural effusion. No nodules or masses. AORTIC ARCH: There is mild calcific atherosclerosis of the aortic arch. There is no aneurysm, dissection or hemodynamically significant stenosis of the visualized ascending aorta and  aortic arch. Conventional 3 vessel aortic branching pattern. The visualized proximal subclavian arteries are widely patent. RIGHT CAROTID SYSTEM: --Common carotid artery: Widely patent origin without common carotid artery dissection or aneurysm. --Internal carotid artery: No dissection, occlusion or aneurysm. Mild atherosclerotic calcification at the carotid bifurcation without hemodynamically significant stenosis. --External carotid artery: No acute abnormality. LEFT CAROTID SYSTEM: --Common carotid artery: Widely patent origin without common carotid artery dissection or aneurysm. Mixed calcified and non-calcified atherosclerotic disease at the bifurcation, extending into the internal carotid artery, resulting in less than 50% stenosis. --Internal carotid artery: There is severe long segment narrowing the left internal carotid artery with an angiographic string sign. However, this is unchanged compared to 09/02/2018. The ICA is patent to the skull base. There is near total loss of enhancement at the petrous segment. --External carotid artery: No acute abnormality. VERTEBRAL ARTERIES: Left dominant configuration. Both origins are clearly patent. The right vertebral artery is occluded at the V3 V4 junction, unchanged. CTA HEAD FINDINGS POSTERIOR CIRCULATION: --Vertebral arteries: The right V4 segment is occluded. There is severe stenosis of the left V4 segment secondary to atherosclerotic calcification. --Posterior inferior cerebellar arteries (PICA): Both are patent --Anterior inferior cerebellar arteries (AICA): Patent origins from the basilar artery. --Basilar artery: Normal. --Superior  cerebellar arteries: Normal. --Posterior cerebral arteries: The right PCA is diminutive, unchanged. Left PCA is normal. ANTERIOR CIRCULATION: --Intracranial internal carotid arteries: The left internal carotid artery is occluded over a short segment at the distal petrous and cavernous segments. The carotid terminus is patent but severely narrowed, unchanged. --Anterior cerebral arteries (ACA): Non opacification of the left A1 segment, unchanged. Both distal anterior cerebral arteries are patent. The anterior communicating arteries patent. --Middle cerebral arteries (MCA): The right MCA is normal. Unchanged attenuated appearance of the left MCA without occlusion. VENOUS SINUSES: As permitted by contrast timing, patent. ANATOMIC VARIANTS: None Review of the MIP images confirms the above findings. IMPRESSION: 1. No emergent large vessel occlusion. 2. Unchanged severe long segment stenosis of the left internal carotid artery with areas of complete loss of enhancement at the skull base. 3. Unchanged occlusion of the right vertebral artery V4 segment and severe stenosis of the left V4 segment. 4. Unchanged narrowed left middle cerebral artery without occlusion. 5.  Aortic atherosclerosis (ICD10-I70.0). 6. These results were called by telephone at the time of interpretation on 12/08/2018 at 7:35 pm to Dr. Kerney Elbe , who verbally acknowledged these results. Electronically Signed   By: Ulyses Jarred [name]   On: 12/08/2018 19:51   Ct Head Code Stroke Wo Contrast  Result Date: 12/08/2018 CLINICAL DATA:  Code stroke. Ataxia. Right-sided weakness. Aphasia. Last seen normal 1400 hours. EXAM: CT HEAD WITHOUT CONTRAST TECHNIQUE: Contiguous axial images were obtained from the base of the skull through the vertex without intravenous contrast. COMPARISON:  11/12/2018 FINDINGS: Brain: Chronic small-vessel changes affect the pons. Old cerebellar infarctions right more than left. Old cortical and subcortical infarction in the left  cerebral hemisphere in a watershed distribution and affecting the left parietal brain. Chronic small-vessel changes affect the white matter. I think there is some acute or subacute extension of the infarction in the left parietal lobe posterior to the region previously seen affected. No sign of mass lesion, hemorrhage, hydrocephalus or extra-axial collection. Vascular: There is atherosclerotic calcification of the major vessels at the base of the brain. Skull: Negative Sinuses/Orbits: Clear/normal Other: None ASPECTS ([place] Stroke Program Early CT Score) - Ganglionic level infarction (caudate, lentiform nuclei, internal capsule, insula, M1-M3 cortex):  7 - Supraganglionic infarction (M4-M6 cortex): 2 Total score (0-10 with 10 being normal): 9, allowing for the chronic changes. IMPRESSION: 1. Acute or subacute extension of infarction in the left parietal lobe posterior to the region previously affected. No evidence of hemorrhage or mass effect. 2. ASPECTS is difficult to apply in the setting of the extensive old infarctions, but the acute changes affect 1 MCA sector. 3. These results were communicated to Dr. Cheral Marker at [facility] 8/27/2020by text page via the [facility] messaging system. Electronically Signed   By: Nelson Chimes [name]   On: 12/08/2018 18:48    Pending Labs Unresulted Labs (From admission, onward)    Start     Ordered   12/08/18 2033  Urinalysis, Complete w Microscopic  Once,   STAT     12/08/18 2033   12/08/18 2033  Sodium, urine, random  Once,   STAT     12/08/18 2033   12/08/18 2033  Creatinine, urine, random  Add-on,   AD     12/08/18 2033   12/08/18 1957  SARS Coronavirus 2 [facility] order, Performed in [facility] hospital lab) Nasopharyngeal Nasopharyngeal Swab  (Symptomatic/High Risk/Tier 1)  Once,   STAT    Question Answer Comment  Is this test for diagnosis or screening Screening   Symptomatic for COVID-19 as defined by CDC No   Hospitalized for COVID-19 No   Admitted to ICU for  COVID-19 No   Previously tested for COVID-19 No   Resident in a congregate (group) care setting No   Employed in healthcare setting No      12/08/18 1957          Vitals/Pain Today's Vitals   12/08/18 2021 12/08/18 2022 12/08/18 2023 12/08/18 2030  BP:    (!) 145/92  Pulse: 88 79 76   Resp: (!) 21 (!) 21 20 19   Temp:      TempSrc:      SpO2: 100% 100% 100%     Isolation Precautions No active isolations  Medications Medications  sodium chloride flush (NS) 0.9 % injection 3 mL (has no administration in time range)  sodium chloride 0.9 % bolus 1,000 mL (has no administration in time range)  insulin glargine (LANTUS) injection 7 Units (has no administration in time range)  insulin aspart (novoLOG) injection 0-9 Units (has no administration in time range)  LORazepam (ATIVAN) 2 MG/ML injection (has no administration in time range)  levETIRAcetam (KEPPRA) IVPB 1500 mg/ 100 mL premix (has no administration in time range)  levETIRAcetam (KEPPRA) IVPB 500 mg/100 mL premix (has no administration in time range)  iohexol (OMNIPAQUE) 350 MG/ML injection 75 mL (75 mLs Intravenous Contrast Given 12/08/18 1919)    Mobility walks with device High fall risk   Focused Assessments Neuro Assessment Handoff:  Swallow screen pass? No    NIH Stroke Scale ( + Modified Stroke Scale Criteria)  Level of Consciousness (1a.)   : Alert, keenly responsive LOC Questions (1b. )   +: Answers neither question correctly LOC Commands (1c. )   + : Performs one task correctly Best Gaze (2. )  +: Normal Visual (3. )  +: Complete hemianopia Facial Palsy (4. )    : Minor paralysis Motor Arm, Left (5a. )   +: No drift Motor Arm, Right (5b. )   +: No drift Motor Leg, Left (6a. )   +: No drift Motor Leg, Right (6b. )   +: No drift Limb Ataxia (7. ): Absent Sensory (8. )   +:  Normal, no sensory loss Best Language (9. )   +: Severe aphasia Dysarthria (10. ): Mild-to-moderate dysarthria, patient slurs at  least some words and, at worst, can be understood with some difficulty Extinction/Inattention (11.)   +: No Abnormality Modified SS Total  +: 7 Complete NIHSS TOTAL: 9 Last date known well: 12/08/18 Last time known well: 1400 Neuro Assessment: Exceptions to WDL Neuro Checks:      Last Documented NIHSS Modified Score: 7 (12/08/18 2041) Has TPA been given? No If patient is a Neuro Trauma and patient is going to OR before floor call report to [facility] nurse: [phone] or [phone]     R Recommendations: See Admitting Provider Note  Report given to:   Additional Notes:  Wife at bedside

## 2018-12-08 NOTE — ED Triage Notes (Signed)
Pt arrives from home with stroke like symptoms. Left sided deficits and aphasia. Pt here 6 weeks ago for same. LKW 1400 today.

## 2018-12-09 ENCOUNTER — Inpatient Hospital Stay (HOSPITAL_COMMUNITY): Payer: Medicare Other

## 2018-12-09 DIAGNOSIS — G4733 Obstructive sleep apnea (adult) (pediatric): Secondary | ICD-10-CM | POA: Diagnosis present

## 2018-12-09 DIAGNOSIS — I69320 Aphasia following cerebral infarction: Secondary | ICD-10-CM | POA: Diagnosis not present

## 2018-12-09 DIAGNOSIS — I69351 Hemiplegia and hemiparesis following cerebral infarction affecting right dominant side: Secondary | ICD-10-CM | POA: Diagnosis not present

## 2018-12-09 DIAGNOSIS — D631 Anemia in chronic kidney disease: Secondary | ICD-10-CM | POA: Diagnosis present

## 2018-12-09 DIAGNOSIS — Z7401 Bed confinement status: Secondary | ICD-10-CM | POA: Diagnosis not present

## 2018-12-09 DIAGNOSIS — I639 Cerebral infarction, unspecified: Secondary | ICD-10-CM | POA: Diagnosis present

## 2018-12-09 DIAGNOSIS — I700 Atherosclerosis of aorta: Secondary | ICD-10-CM | POA: Diagnosis present

## 2018-12-09 DIAGNOSIS — Z7982 Long term (current) use of aspirin: Secondary | ICD-10-CM | POA: Diagnosis not present

## 2018-12-09 DIAGNOSIS — R569 Unspecified convulsions: Secondary | ICD-10-CM | POA: Diagnosis not present

## 2018-12-09 DIAGNOSIS — Z8673 Personal history of transient ischemic attack (TIA), and cerebral infarction without residual deficits: Secondary | ICD-10-CM

## 2018-12-09 DIAGNOSIS — F0150 Vascular dementia without behavioral disturbance: Secondary | ICD-10-CM | POA: Diagnosis present

## 2018-12-09 DIAGNOSIS — R000 Tachycardia, unspecified: Secondary | ICD-10-CM | POA: Diagnosis not present

## 2018-12-09 DIAGNOSIS — F418 Other specified anxiety disorders: Secondary | ICD-10-CM | POA: Diagnosis present

## 2018-12-09 DIAGNOSIS — I63232 Cerebral infarction due to unspecified occlusion or stenosis of left carotid arteries: Secondary | ICD-10-CM | POA: Diagnosis not present

## 2018-12-09 DIAGNOSIS — G8191 Hemiplegia, unspecified affecting right dominant side: Secondary | ICD-10-CM | POA: Diagnosis present

## 2018-12-09 DIAGNOSIS — R1310 Dysphagia, unspecified: Secondary | ICD-10-CM | POA: Diagnosis present

## 2018-12-09 DIAGNOSIS — N179 Acute kidney failure, unspecified: Secondary | ICD-10-CM | POA: Diagnosis not present

## 2018-12-09 DIAGNOSIS — E1151 Type 2 diabetes mellitus with diabetic peripheral angiopathy without gangrene: Secondary | ICD-10-CM | POA: Diagnosis present

## 2018-12-09 DIAGNOSIS — R4701 Aphasia: Secondary | ICD-10-CM | POA: Diagnosis present

## 2018-12-09 DIAGNOSIS — I10 Essential (primary) hypertension: Secondary | ICD-10-CM | POA: Diagnosis not present

## 2018-12-09 DIAGNOSIS — R29709 NIHSS score 9: Secondary | ICD-10-CM | POA: Diagnosis present

## 2018-12-09 DIAGNOSIS — Z20828 Contact with and (suspected) exposure to other viral communicable diseases: Secondary | ICD-10-CM | POA: Diagnosis present

## 2018-12-09 DIAGNOSIS — G40409 Other generalized epilepsy and epileptic syndromes, not intractable, without status epilepticus: Secondary | ICD-10-CM | POA: Diagnosis present

## 2018-12-09 DIAGNOSIS — G9349 Other encephalopathy: Secondary | ICD-10-CM | POA: Diagnosis present

## 2018-12-09 DIAGNOSIS — E119 Type 2 diabetes mellitus without complications: Secondary | ICD-10-CM | POA: Diagnosis not present

## 2018-12-09 DIAGNOSIS — E1122 Type 2 diabetes mellitus with diabetic chronic kidney disease: Secondary | ICD-10-CM | POA: Diagnosis present

## 2018-12-09 DIAGNOSIS — N183 Chronic kidney disease, stage 3 (moderate): Secondary | ICD-10-CM | POA: Diagnosis not present

## 2018-12-09 DIAGNOSIS — I63512 Cerebral infarction due to unspecified occlusion or stenosis of left middle cerebral artery: Secondary | ICD-10-CM | POA: Diagnosis present

## 2018-12-09 DIAGNOSIS — R0689 Other abnormalities of breathing: Secondary | ICD-10-CM | POA: Diagnosis not present

## 2018-12-09 DIAGNOSIS — G459 Transient cerebral ischemic attack, unspecified: Secondary | ICD-10-CM | POA: Diagnosis not present

## 2018-12-09 DIAGNOSIS — E785 Hyperlipidemia, unspecified: Secondary | ICD-10-CM | POA: Diagnosis present

## 2018-12-09 DIAGNOSIS — M2550 Pain in unspecified joint: Secondary | ICD-10-CM | POA: Diagnosis not present

## 2018-12-09 DIAGNOSIS — Z515 Encounter for palliative care: Secondary | ICD-10-CM | POA: Diagnosis not present

## 2018-12-09 DIAGNOSIS — Z7189 Other specified counseling: Secondary | ICD-10-CM | POA: Diagnosis not present

## 2018-12-09 DIAGNOSIS — F0151 Vascular dementia with behavioral disturbance: Secondary | ICD-10-CM | POA: Diagnosis not present

## 2018-12-09 DIAGNOSIS — Z66 Do not resuscitate: Secondary | ICD-10-CM | POA: Diagnosis not present

## 2018-12-09 DIAGNOSIS — I129 Hypertensive chronic kidney disease with stage 1 through stage 4 chronic kidney disease, or unspecified chronic kidney disease: Secondary | ICD-10-CM | POA: Diagnosis present

## 2018-12-09 LAB — URINALYSIS, COMPLETE (UACMP) WITH MICROSCOPIC
Bacteria, UA: NONE SEEN
Bilirubin Urine: NEGATIVE
Glucose, UA: NEGATIVE mg/dL
Ketones, ur: NEGATIVE mg/dL
Nitrite: NEGATIVE
Protein, ur: 30 mg/dL — AB
Specific Gravity, Urine: 1.039 — ABNORMAL HIGH (ref 1.005–1.030)
WBC, UA: >50 WBC/hpf — ABNORMAL HIGH (ref 0–5)
pH: 5 (ref 5.0–8.0)

## 2018-12-09 LAB — COMPREHENSIVE METABOLIC PANEL
ALT: 19 U/L (ref 0–44)
AST: 25 U/L (ref 15–41)
Albumin: 3.2 g/dL — ABNORMAL LOW (ref 3.5–5.0)
Alkaline Phosphatase: 82 U/L (ref 38–126)
Anion gap: 10 (ref 5–15)
BUN: 23 mg/dL (ref 8–23)
CO2: 20 mmol/L — ABNORMAL LOW (ref 22–32)
Calcium: 8.8 mg/dL — ABNORMAL LOW (ref 8.9–10.3)
Chloride: 112 mmol/L — ABNORMAL HIGH (ref 98–111)
Creatinine, Ser: 1.8 mg/dL — ABNORMAL HIGH (ref 0.61–1.24)
GFR calc Af Amer: 41 mL/min — ABNORMAL LOW (ref >60)
GFR calc non Af Amer: 35 mL/min — ABNORMAL LOW (ref >60)
Glucose, Bld: 146 mg/dL — ABNORMAL HIGH (ref 70–99)
Potassium: 4.2 mmol/L (ref 3.5–5.1)
Sodium: 142 mmol/L (ref 135–145)
Total Bilirubin: 0.3 mg/dL (ref 0.3–1.2)
Total Protein: 5.8 g/dL — ABNORMAL LOW (ref 6.5–8.1)

## 2018-12-09 LAB — LIPID PANEL
Cholesterol: 92 mg/dL (ref 0–200)
HDL: 45 mg/dL (ref >40)
LDL Cholesterol: 38 mg/dL (ref 0–99)
Total CHOL/HDL Ratio: 2 RATIO
Triglycerides: 45 mg/dL (ref <150)
VLDL: 9 mg/dL (ref 0–40)

## 2018-12-09 LAB — CREATININE, URINE, RANDOM: Creatinine, Urine: 58.39 mg/dL

## 2018-12-09 LAB — GLUCOSE, CAPILLARY
Glucose-Capillary: 151 mg/dL — ABNORMAL HIGH (ref 70–99)
Glucose-Capillary: 139 mg/dL — ABNORMAL HIGH (ref 70–99)
Glucose-Capillary: 120 mg/dL — ABNORMAL HIGH (ref 70–99)

## 2018-12-09 LAB — CBG MONITORING, ED: Glucose-Capillary: 131 mg/dL — ABNORMAL HIGH (ref 70–99)

## 2018-12-09 LAB — HEMOGLOBIN A1C
Hgb A1c MFr Bld: 7 % — ABNORMAL HIGH (ref 4.8–5.6)
Mean Plasma Glucose: 154.2 mg/dL

## 2018-12-09 LAB — SODIUM, URINE, RANDOM: Sodium, Ur: 108 mmol/L

## 2018-12-09 MED ORDER — SODIUM CHLORIDE 0.9 % IV BOLUS
500.0000 mL | Freq: Once | INTRAVENOUS | Status: AC
  Administered 2018-12-09: 04:00:00 500 mL via INTRAVENOUS

## 2018-12-09 MED ORDER — ACETAMINOPHEN 325 MG PO TABS: 650.0000 mg | ORAL_TABLET | ORAL | Status: DC | PRN

## 2018-12-09 MED ORDER — ACETAMINOPHEN 650 MG RE SUPP
650.0000 mg | RECTAL | Status: DC | PRN
  Administered 2018-12-11 – 2018-12-15 (×8): 650 mg via RECTAL
  Filled 2018-12-09 (×8): qty 1

## 2018-12-09 MED ORDER — STROKE: EARLY STAGES OF RECOVERY BOOK
Freq: Once | Status: AC
  Administered 2018-12-10: 02:00:00
  Filled 2018-12-09: qty 1

## 2018-12-09 MED ORDER — SENNOSIDES-DOCUSATE SODIUM 8.6-50 MG PO TABS: 1.0000 | ORAL_TABLET | Freq: Every evening | ORAL | Status: DC | PRN

## 2018-12-09 MED ORDER — PRO-STAT SUGAR FREE PO LIQD
30.0000 mL | Freq: Every day | ORAL | Status: DC
  Administered 2018-12-09 – 2018-12-11 (×3): 30 mL
  Filled 2018-12-09 (×3): qty 30

## 2018-12-09 MED ORDER — ATORVASTATIN CALCIUM 80 MG PO TABS
80.0000 mg | ORAL_TABLET | Freq: Every day | ORAL | Status: DC
  Administered 2018-12-09: 18:00:00 80 mg via ORAL
  Filled 2018-12-09: qty 1

## 2018-12-09 MED ORDER — HALOPERIDOL LACTATE 5 MG/ML IJ SOLN
2.0000 mg | Freq: Four times a day (QID) | INTRAMUSCULAR | Status: DC | PRN
  Administered 2018-12-09 (×2): 2 mg via INTRAVENOUS
  Filled 2018-12-09 (×2): qty 1

## 2018-12-09 MED ORDER — LABETALOL HCL 5 MG/ML IV SOLN
10.0000 mg | INTRAVENOUS | Status: DC | PRN
  Administered 2018-12-09: 10 mg via INTRAVENOUS
  Filled 2018-12-09: qty 4

## 2018-12-09 MED ORDER — LORAZEPAM 2 MG/ML IJ SOLN
1.0000 mg | INTRAMUSCULAR | Status: DC | PRN
  Administered 2018-12-09 – 2018-12-12 (×4): 1 mg via INTRAVENOUS
  Filled 2018-12-09 (×4): qty 1

## 2018-12-09 MED ORDER — SODIUM CHLORIDE 0.9 % IV SOLN
INTRAVENOUS | Status: AC
  Administered 2018-12-09: 04:00:00 via INTRAVENOUS

## 2018-12-09 MED ORDER — CLOPIDOGREL BISULFATE 75 MG PO TABS
75.0000 mg | ORAL_TABLET | Freq: Every day | ORAL | Status: DC
  Administered 2018-12-09 – 2018-12-10 (×2): 75 mg via ORAL
  Filled 2018-12-09 (×2): qty 1

## 2018-12-09 MED ORDER — LORAZEPAM 2 MG/ML IJ SOLN
INTRAMUSCULAR | Status: AC
  Filled 2018-12-09: qty 1

## 2018-12-09 MED ORDER — LORAZEPAM 2 MG/ML IJ SOLN
2.0000 mg | Freq: Once | INTRAMUSCULAR | Status: AC
  Administered 2018-12-09: 13:00:00 2 mg via INTRAVENOUS
  Filled 2018-12-09: qty 1

## 2018-12-09 MED ORDER — HALOPERIDOL LACTATE 5 MG/ML IJ SOLN
1.0000 mg | Freq: Four times a day (QID) | INTRAMUSCULAR | Status: DC | PRN
  Administered 2018-12-09: 1 mg via INTRAVENOUS
  Filled 2018-12-09: qty 1

## 2018-12-09 MED ORDER — HEPARIN SODIUM (PORCINE) 5000 UNIT/ML IJ SOLN
5000.0000 [IU] | Freq: Three times a day (TID) | INTRAMUSCULAR | Status: DC
  Administered 2018-12-09 – 2018-12-12 (×11): 5000 [IU] via SUBCUTANEOUS
  Filled 2018-12-09 (×10): qty 1

## 2018-12-09 MED ORDER — HALOPERIDOL LACTATE 2 MG/ML PO CONC
5.0000 mg | Freq: Four times a day (QID) | ORAL | Status: DC | PRN
  Administered 2018-12-09: 22:00:00 5 mg
  Filled 2018-12-09 (×2): qty 2.5

## 2018-12-09 MED ORDER — LORAZEPAM 2 MG/ML IJ SOLN
1.0000 mg | Freq: Once | INTRAMUSCULAR | Status: AC
  Administered 2018-12-09: 1 mg via INTRAVENOUS
  Filled 2018-12-09: qty 1

## 2018-12-09 MED ORDER — ASPIRIN 325 MG PO TABS
325.0000 mg | ORAL_TABLET | Freq: Every day | ORAL | Status: DC
  Administered 2018-12-10: 09:00:00 325 mg via ORAL
  Filled 2018-12-09: qty 1

## 2018-12-09 MED ORDER — ACETAMINOPHEN 160 MG/5ML PO SOLN: 650.0000 mg | ORAL | Status: DC | PRN

## 2018-12-09 MED ORDER — OSMOLITE 1.5 CAL PO LIQD
1000.0000 mL | ORAL | Status: DC
  Administered 2018-12-09 – 2018-12-11 (×3): 1000 mL
  Filled 2018-12-09 (×4): qty 1000

## 2018-12-09 MED ORDER — LORAZEPAM 2 MG/ML IJ SOLN
1.0000 mg | Freq: Once | INTRAMUSCULAR | Status: AC | PRN
  Administered 2018-12-09: 1 mg via INTRAVENOUS
  Filled 2018-12-09: qty 1

## 2018-12-09 MED ORDER — HALOPERIDOL LACTATE 2 MG/ML PO CONC
5.0000 mg | Freq: Four times a day (QID) | ORAL | Status: DC | PRN
  Filled 2018-12-09: qty 2.5

## 2018-12-09 NOTE — ED Notes (Signed)
Family at bedside. 

## 2018-12-09 NOTE — ED Notes (Signed)
ED TO INPATIENT HANDOFF REPORT  ED Nurse Name and Phone #:   S Name/Age/Gender Delray Alt 78 y.o. male Room/Bed: 027C/027C  Code Status   Code Status: Full Code  Home/SNF/Other Home Patient oriented to: self Is this baseline? Yes   Triage Complete: Triage complete  Chief Complaint Code stroke  Triage Note Pt arrives from home with stroke like symptoms. Left sided deficits and aphasia. Pt here 6 weeks ago for same. LKW 1400 today.   Allergies Not on File  Level of Care/Admitting Diagnosis ED Disposition    ED Disposition Condition [place]: [place] [100100]  Level of Care: Progressive [102]  I expect the patient will be discharged within 24 hours: Yes  LOW acuity---Tx typically complete <24 hrs---ACUTE conditions typically can be evaluated <24 hours---LABS likely to return to acceptable levels <24 hours---IS near functional baseline---EXPECTED to return to current living arrangement---NOT newly hypoxic: Does not meet criteria for 5C-Observation unit  Covid Evaluation: Asymptomatic Screening Protocol (No Symptoms)  Diagn[facility]: [facility]schem[facility] stroke [id number]  Admitting Physician: Vianne Bulls [id number]  Attending Physician: Vianne Bulls [id number]  PT Class (Do Not Modify): Observation [104]  PT Acc Code (Do Not Modify): Observation [10022]       B Medical/Surgery History Past Medical History:  Diagnosis Date  . CKD (chronic kidney disease), stage III ([place]) 12/08/2018  . Depression with anxiety 12/08/2018  . Essential hypertension 12/08/2018  . Insulin-requiring or dependent type II diabetes mellitus ([place]) 12/08/2018  . Ischemic stroke ([place]) 12/08/2018  . Normocytic anemia 12/08/2018  . Vascular dementia ([place]) 12/08/2018   Past Surgical History:  Procedure Laterality Date  . CAROTID ENDARTERECTOMY    . CHOLECYSTECTOMY    . NEPHRECTOMY       A IV Location/Drains/Wounds Patient Lines/Drains/Airways Status    Active Line/Drains/Airways    Name:   Placement date:   Placement time:   Site:   Days:   Peripheral IV 12/08/18 Right Wrist   12/08/18    2059    Wrist   1   Peripheral IV 12/09/18 Right Antecubital   12/09/18    0342    Antecubital   less than 1          Intake/Output Last 24 hours No intake or output data in the 24 hours ending 12/09/18 [id number]  Labs/Imaging Results for orders placed or performed during the hospital encounter of 12/08/18 (from the past 48 hour(s))  Protime-INR     Status: None   Collection Time: 12/08/18  6:27 PM  Result Value Ref Range   Prothrombin Time 13.5 11.4 - 15.2 seconds   INR 1.0 0.8 - 1.2    Comment: (NOTE) INR goal varies based on device and disease states. Performed at [facility], [place] [address] [place], [place] [address]   APTT     Status: None   Collection Time: 12/08/18  6:27 PM  Result Value Ref Range   aPTT 28 24 - 36 seconds    Comment: Performed at [facility] [address], [place] [address]  CBC     Status: Abnormal   Collection Time: 12/08/18  6:27 PM  Result Value Ref Range   WBC 7.8 4.0 - 10.5 K/uL   RBC 3.15 (L) 4.22 - 5.81 MIL/uL   Hemoglobin 9.4 (L) 13.0 - 17.0 g/dL   HCT 30.5 (L) 39.0 - 52.0 %   MCV 96.8 80.0 - 100.0 fL  MCH 29.8 26.0 - 34.0 pg   MCHC 30.8 30.0 - 36.0 g/dL   RDW 13.4 11.5 - 15.5 %   Platelets 236 150 - 400 K/uL   nRBC 0.0 0.0 - 0.2 %    Comment: Performed at [facility], [place] [address] [place], [place] [address]  Differential     Status: None   Collection Time: 12/08/18  6:27 PM  Result Value Ref Range   Neutrophils Relative % 79 %   Neutro Abs 6.2 1.7 - 7.7 K/uL   Lymphocytes Relative 10 %   Lymphs Abs 0.7 0.7 - 4.0 K/uL   Monocytes Relative 8 %   Monocytes Absolute 0.6 0.1 - 1.0 K/uL   Eosinophils Relative 2 %   Eosinophils Absolute 0.2 0.0 - 0.5 K/uL   Basophils Relative 1 %   Basophils Absolute 0.1 0.0 - 0.1 K/uL   Immature Granulocytes 0 %   Abs Immature  Granulocytes 0.03 0.00 - 0.07 K/uL    Comment: Performed at [facility], [place] [address], [place], [place] [address]  Comprehensive metabolic panel     Status: Abnormal   Collection Time: 12/08/18  6:27 PM  Result Value Ref Range   Sodium 139 135 - 145 mmol/L   Potassium 4.4 3.5 - 5.1 mmol/L   Chloride 108 98 - 111 mmol/L   CO2 22 22 - 32 mmol/L   Glucose, Bld 277 (H) 70 - 99 mg/dL   BUN 29 (H) 8 - 23 mg/dL   Creatinine, Ser 2.04 (H) 0.61 - 1.24 mg/dL   Calcium 9.0 8.9 - 10.3 mg/dL   Total Protein 6.3 (L) 6.5 - 8.1 g/dL   Albumin 3.4 (L) 3.5 - 5.0 g/dL   AST 19 15 - 41 U/L   ALT 19 0 - 44 U/L   Alkaline Phosphatase 86 38 - 126 U/L   Total Bilirubin 0.5 0.3 - 1.2 mg/dL   GFR calc non Af Amer 30 (L) >60 mL/min   GFR calc Af Amer 35 (L) >60 mL/min   Anion gap 9 5 - 15    Comment: Performed at [facility] [address], [place], [place] [address]  I-stat chem 8, ED     Status: Abnormal   Collection Time: 12/08/18  6:55 PM  Result Value Ref Range   Sodium 142 135 - 145 mmol/L   Potassium 4.5 3.5 - 5.1 mmol/L   Chloride 108 98 - 111 mmol/L   BUN 29 (H) 8 - 23 mg/dL   Creatinine, Ser 1.90 (H) 0.61 - 1.24 mg/dL   Glucose, Bld 258 (H) 70 - 99 mg/dL   Calcium, Ion 1.25 1.15 - 1.40 mmol/L   TCO2 21 (L) 22 - 32 mmol/L   Hemoglobin 10.2 (L) 13.0 - 17.0 g/dL   HCT 30.0 (L) 39.0 - 52.0 %  SARS Coronavirus 2 [facility] order, Performed in [facility] hospital lab) Nasopharyngeal Nasopharyngeal Swab     Status: None   Collection Time: 12/08/18  8:45 PM   Specimen: Nasopharyngeal Swab  Result Value Ref Range   SARS Coronavirus 2 NEGATIVE NEGATIVE    Comment: (NOTE) If result is NEGATIVE SARS-CoV-2 target nucleic acids are NOT DETECTED. The SARS-CoV-2 RNA is generally detectable in upper and lower  respiratory specimens during the acute phase of infection. The lowest  concentration of SARS-CoV-2 viral copies this assay can detect is 250  copies / mL. A negative result does  not preclude SARS-CoV-2 infection  and  should not be used as the sole basis for treatment or other  patient management decisions.  A negative result may occur with  improper specimen collection / handling, submission of specimen other  than nasopharyngeal swab, presence of viral mutation(s) within the  areas targeted by this assay, and inadequate number of viral copies  (<250 copies / mL). A negative result must be combined with clinical  observations, patient history, and epidemiological information. If result is POSITIVE SARS-CoV-2 target nucleic acids are DETECTED. The SARS-CoV-2 RNA is generally detectable in upper and lower  respiratory specimens dur ing the acute phase of infection.  Positive  results are indicative of active infection with SARS-CoV-2.  Clinical  correlation with patient history and other diagnostic information is  necessary to determine patient infection status.  Positive results do  not rule out bacterial infection or co-infection with other viruses. If result is PRESUMPTIVE POSTIVE SARS-CoV-2 nucleic acids MAY BE PRESENT.   A presumptive positive result was obtained on the submitted specimen  and confirmed on repeat testing.  While 2019 novel coronavirus  (SARS-CoV-2) nucleic acids may be present in the submitted sample  additional confirmatory testing may be necessary for epidemiological  and / or clinical management purposes  to differentiate between  SARS-CoV-2 and other Sarbecovirus currently known to infect humans.  If clinically indicated additional testing with an alternate test  methodology [id number]) is advised. The SARS-CoV-2 RNA is generally  detectable in upper and lower respiratory sp ecimens during the acute  phase of infection. The expected result is Negative. Fact Sheet for Patients:  StrictlyIdeas.no Fact Sheet for Healthcare Providers: BankingDealers.co.za This test is not yet approved or  cleared by the [place] FDA and has been authorized for detection and/or diagnosis of SARS-CoV-2 by FDA under an Emergency Use Authorization (EUA).  This EUA will remain in effect (meaning this test can be used) for the duration of the COVID-19 declaration under Section 564(b)(1) of the Act, 21 U.S.C. section 360bbb-3(b)(1), unless the authorization is terminated or revoked sooner. Performed at [facility], [place] [address] [place], [place] [address]   CBG monitoring, ED     Status: Abnormal   Collection Time: 12/08/18  9:08 PM  Result Value Ref Range   Glucose-Capillary 165 (H) 70 - 99 mg/dL  CBG monitoring, ED     Status: Abnormal   Collection Time: 12/08/18 11:27 PM  Result Value Ref Range   Glucose-Capillary 107 (H) 70 - 99 mg/dL  Urinalysis, Complete w Microscopic     Status: Abnormal   Collection Time: 12/09/18  1:09 AM  Result Value Ref Range   Color, Urine STRAW (A) YELLOW   APPearance HAZY (A) CLEAR   Specific Gravity, Urine 1.039 (H) 1.005 - 1.030   pH 5.0 5.0 - 8.0   Glucose, UA NEGATIVE NEGATIVE mg/dL   Hgb urine dipstick SMALL (A) NEGATIVE   Bilirubin Urine NEGATIVE NEGATIVE   Ketones, ur NEGATIVE NEGATIVE mg/dL   Protein, ur 30 (A) NEGATIVE mg/dL   Nitrite NEGATIVE NEGATIVE   Leukocytes,Ua LARGE (A) NEGATIVE   RBC / HPF 0-5 0 - 5 RBC/hpf   WBC, UA >50 (H) 0 - 5 WBC/hpf   Bacteria, UA NONE SEEN NONE SEEN   Squamous Epithelial / LPF 0-5 0 - 5   Mucus PRESENT     Comment: Performed at [facility], [place] [address] [place], [place] [address]  Sodium, urine, random     Status: None   Collection Time:  12/09/18  1:09 AM  Result Value Ref Range   Sodium, Ur 108 mmol/L    Comment: Performed at [facility] [address], [place], [place] [address]  Creatinine, urine, random     Status: None   Collection Time: 12/09/18  1:09 AM  Result Value Ref Range   Creatinine, Urine 58.39 mg/dL    Comment: Performed at [facility]  [address] [place], [place] [address]  Comprehensive metabolic panel     Status: Abnormal   Collection Time: 12/09/18  3:35 AM  Result Value Ref Range   Sodium 142 135 - 145 mmol/L   Potassium 4.2 3.5 - 5.1 mmol/L   Chloride 112 (H) 98 - 111 mmol/L   CO2 20 (L) 22 - 32 mmol/L   Glucose, Bld 146 (H) 70 - 99 mg/dL   BUN 23 8 - 23 mg/dL   Creatinine, Ser 1.80 (H) 0.61 - 1.24 mg/dL   Calcium 8.8 (L) 8.9 - 10.3 mg/dL   Total Protein 5.8 (L) 6.5 - 8.1 g/dL   Albumin 3.2 (L) 3.5 - 5.0 g/dL   AST 25 15 - 41 U/L   ALT 19 0 - 44 U/L   Alkaline Phosphatase 82 38 - 126 U/L   Total Bilirubin 0.3 0.3 - 1.2 mg/dL   GFR calc non Af Amer 35 (L) >60 mL/min   GFR calc Af Amer 41 (L) >60 mL/min   Anion gap 10 5 - 15    Comment: Performed at [facility], [place] [address], [place], [place] [address]  Lipid panel     Status: None   Collection Time: 12/09/18  3:35 AM  Result Value Ref Range   Cholesterol 92 0 - 200 mg/dL   Triglycerides 45 <150 mg/dL   HDL 45 >40 mg/dL   Total CHOL/HDL Ratio 2.0 RATIO   VLDL 9 0 - 40 mg/dL   LDL Cholesterol 38 0 - 99 mg/dL    Comment:        Total Cholesterol/HDL:CHD Risk Coronary Heart Disease Risk Table                     Men   Women  1/2 Average Risk   3.4   3.3  Average Risk       5.0   4.4  2 X Average Risk   9.6   7.1  3 X Average Risk  23.4   11.0        Use the calculated Patient Ratio above and the CHD Risk Table to determine the patient's CHD Risk.        ATP III CLASSIFICATION (LDL):  <100     mg/dL   Optimal  100-129  mg/dL   Near or Above                    Optimal  130-159  mg/dL   Borderline  160-189  mg/dL   High  >190     mg/dL   Very High Performed at [facility] [address], [place], [place] [address]   CBG monitoring, ED     Status: Abnormal   Collection Time: 12/09/18  3:47 AM  Result Value Ref Range   Glucose-Capillary 131 (H) 70 - 99 mg/dL   Ct Angio Head W Or Wo Contrast  Result Date: 12/08/2018 CLINICAL DATA:   Right-sided weakness and aphasia EXAM: CT ANGIOGRAPHY HEAD AND NECK TECHNIQUE: Multidetector CT  imaging of the head and neck was performed using the standard protocol during bolus administration of intravenous contrast. Multiplanar CT image reconstructions and MIPs were obtained to evaluate the vascular anatomy. Carotid stenosis measurements (when applicable) are obtained utilizing NASCET criteria, using the distal internal carotid diameter as the denominator. CONTRAST:  83mL OMNIPAQUE IOHEXOL 350 MG/ML SOLN COMPARISON:  CTA head neck 09/02/2018 FINDINGS: CTA NECK FINDINGS SKELETON: There is no bony spinal canal stenosis. No lytic or blastic lesion. OTHER NECK: Normal pharynx, larynx and major salivary glands. No cervical lymphadenopathy. Unremarkable thyroid gland. UPPER CHEST: No pneumothorax or pleural effusion. No nodules or masses. AORTIC ARCH: There is mild calcific atherosclerosis of the aortic arch. There is no aneurysm, dissection or hemodynamically significant stenosis of the visualized ascending aorta and aortic arch. Conventional 3 vessel aortic branching pattern. The visualized proximal subclavian arteries are widely patent. RIGHT CAROTID SYSTEM: --Common carotid artery: Widely patent origin without common carotid artery dissection or aneurysm. --Internal carotid artery: No dissection, occlusion or aneurysm. Mild atherosclerotic calcification at the carotid bifurcation without hemodynamically significant stenosis. --External carotid artery: No acute abnormality. LEFT CAROTID SYSTEM: --Common carotid artery: Widely patent origin without common carotid artery dissection or aneurysm. Mixed calcified and non-calcified atherosclerotic disease at the bifurcation, extending into the internal carotid artery, resulting in less than 50% stenosis. --Internal carotid artery: There is severe long segment narrowing the left internal carotid artery with an angiographic string sign. However, this is unchanged compared  to 09/02/2018. The ICA is patent to the skull base. There is near total loss of enhancement at the petrous segment. --External carotid artery: No acute abnormality. VERTEBRAL ARTERIES: Left dominant configuration. Both origins are clearly patent. The right vertebral artery is occluded at the V3 V4 junction, unchanged. CTA HEAD FINDINGS POSTERIOR CIRCULATION: --Vertebral arteries: The right V4 segment is occluded. There is severe stenosis of the left V4 segment secondary to atherosclerotic calcification. --Posterior inferior cerebellar arteries (PICA): Both are patent --Anterior inferior cerebellar arteries (AICA): Patent origins from the basilar artery. --Basilar artery: Normal. --Superior cerebellar arteries: Normal. --Posterior cerebral arteries: The right PCA is diminutive, unchanged. Left PCA is normal. ANTERIOR CIRCULATION: --Intracranial internal carotid arteries: The left internal carotid artery is occluded over a short segment at the distal petrous and cavernous segments. The carotid terminus is patent but severely narrowed, unchanged. --Anterior cerebral arteries (ACA): Non opacification of the left A1 segment, unchanged. Both distal anterior cerebral arteries are patent. The anterior communicating arteries patent. --Middle cerebral arteries (MCA): The right MCA is normal. Unchanged attenuated appearance of the left MCA without occlusion. VENOUS SINUSES: As permitted by contrast timing, patent. ANATOMIC VARIANTS: None Review of the MIP images confirms the above findings. IMPRESSION: 1. No emergent large vessel occlusion. 2. Unchanged severe long segment stenosis of the left internal carotid artery with areas of complete loss of enhancement at the skull base. 3. Unchanged occlusion of the right vertebral artery V4 segment and severe stenosis of the left V4 segment. 4. Unchanged narrowed left middle cerebral artery without occlusion. 5.  Aortic atherosclerosis (ICD10-I70.0). 6. These results were called by  telephone at the time of interpretation on 12/08/2018 at 7:35 pm to Dr. Kerney Elbe , who verbally acknowledged these results. Electronically Signed   By: Ulyses Jarred [name]   On: 12/08/2018 19:51   Ct Angio Neck W Or Wo Contrast  Result Date: 12/08/2018 CLINICAL DATA:  Right-sided weakness and aphasia EXAM: CT ANGIOGRAPHY HEAD AND NECK TECHNIQUE: Multidetector CT imaging of the head and neck  was performed using the standard protocol during bolus administration of intravenous contrast. Multiplanar CT image reconstructions and MIPs were obtained to evaluate the vascular anatomy. Carotid stenosis measurements (when applicable) are obtained utilizing NASCET criteria, using the distal internal carotid diameter as the denominator. CONTRAST:  52mL OMNIPAQUE IOHEXOL 350 MG/ML SOLN COMPARISON:  CTA head neck 09/02/2018 FINDINGS: CTA NECK FINDINGS SKELETON: There is no bony spinal canal stenosis. No lytic or blastic lesion. OTHER NECK: Normal pharynx, larynx and major salivary glands. No cervical lymphadenopathy. Unremarkable thyroid gland. UPPER CHEST: No pneumothorax or pleural effusion. No nodules or masses. AORTIC ARCH: There is mild calcific atherosclerosis of the aortic arch. There is no aneurysm, dissection or hemodynamically significant stenosis of the visualized ascending aorta and aortic arch. Conventional 3 vessel aortic branching pattern. The visualized proximal subclavian arteries are widely patent. RIGHT CAROTID SYSTEM: --Common carotid artery: Widely patent origin without common carotid artery dissection or aneurysm. --Internal carotid artery: No dissection, occlusion or aneurysm. Mild atherosclerotic calcification at the carotid bifurcation without hemodynamically significant stenosis. --External carotid artery: No acute abnormality. LEFT CAROTID SYSTEM: --Common carotid artery: Widely patent origin without common carotid artery dissection or aneurysm. Mixed calcified and non-calcified atherosclerotic  disease at the bifurcation, extending into the internal carotid artery, resulting in less than 50% stenosis. --Internal carotid artery: There is severe long segment narrowing the left internal carotid artery with an angiographic string sign. However, this is unchanged compared to 09/02/2018. The ICA is patent to the skull base. There is near total loss of enhancement at the petrous segment. --External carotid artery: No acute abnormality. VERTEBRAL ARTERIES: Left dominant configuration. Both origins are clearly patent. The right vertebral artery is occluded at the V3 V4 junction, unchanged. CTA HEAD FINDINGS POSTERIOR CIRCULATION: --Vertebral arteries: The right V4 segment is occluded. There is severe stenosis of the left V4 segment secondary to atherosclerotic calcification. --Posterior inferior cerebellar arteries (PICA): Both are patent --Anterior inferior cerebellar arteries (AICA): Patent origins from the basilar artery. --Basilar artery: Normal. --Superior cerebellar arteries: Normal. --Posterior cerebral arteries: The right PCA is diminutive, unchanged. Left PCA is normal. ANTERIOR CIRCULATION: --Intracranial internal carotid arteries: The left internal carotid artery is occluded over a short segment at the distal petrous and cavernous segments. The carotid terminus is patent but severely narrowed, unchanged. --Anterior cerebral arteries (ACA): Non opacification of the left A1 segment, unchanged. Both distal anterior cerebral arteries are patent. The anterior communicating arteries patent. --Middle cerebral arteries (MCA): The right MCA is normal. Unchanged attenuated appearance of the left MCA without occlusion. VENOUS SINUSES: As permitted by contrast timing, patent. ANATOMIC VARIANTS: None Review of the MIP images confirms the above findings. IMPRESSION: 1. No emergent large vessel occlusion. 2. Unchanged severe long segment stenosis of the left internal carotid artery with areas of complete loss of  enhancement at the skull base. 3. Unchanged occlusion of the right vertebral artery V4 segment and severe stenosis of the left V4 segment. 4. Unchanged narrowed left middle cerebral artery without occlusion. 5.  Aortic atherosclerosis (ICD10-I70.0). 6. These results were called by telephone at the time of interpretation on 12/08/2018 at 7:35 pm to Dr. Kerney Elbe , who verbally acknowledged these results. Electronically Signed   By: Ulyses Jarred [name]   On: 12/08/2018 19:51   Ct Head Code Stroke Wo Contrast  Result Date: 12/08/2018 CLINICAL DATA:  Code stroke. Ataxia. Right-sided weakness. Aphasia. Last seen normal 1400 hours. EXAM: CT HEAD WITHOUT CONTRAST TECHNIQUE: Contiguous axial images were obtained from the base of  the skull through the vertex without intravenous contrast. COMPARISON:  11/12/2018 FINDINGS: Brain: Chronic small-vessel changes affect the pons. Old cerebellar infarctions right more than left. Old cortical and subcortical infarction in the left cerebral hemisphere in a watershed distribution and affecting the left parietal brain. Chronic small-vessel changes affect the white matter. I think there is some acute or subacute extension of the infarction in the left parietal lobe posterior to the region previously seen affected. No sign of mass lesion, hemorrhage, hydrocephalus or extra-axial collection. Vascular: There is atherosclerotic calcification of the major vessels at the base of the brain. Skull: Negative Sinuses/Orbits: Clear/normal Other: None ASPECTS ([place] Stroke Program Early CT Score) - Ganglionic level infarction (caudate, lentiform nuclei, internal capsule, insula, M1-M3 cortex): 7 - Supraganglionic infarction (M4-M6 cortex): 2 Total score (0-10 with 10 being normal): 9, allowing for the chronic changes. IMPRESSION: 1. Acute or subacute extension of infarction in the left parietal lobe posterior to the region previously affected. No evidence of hemorrhage or mass effect. 2.  ASPECTS is difficult to apply in the setting of the extensive old infarctions, but the acute changes affect 1 MCA sector. 3. These results were communicated to Dr. Cheral Marker at [facility] 8/27/2020by text page via the [facility] messaging system. Electronically Signed   By: Nelson Chimes [name]   On: 12/08/2018 18:48    Pending Labs Unresulted Labs (From admission, onward)    Start     Ordered   12/09/18 0500  Hemoglobin A1c  Tomorrow morning,   R     12/09/18 0244          Vitals/Pain Today's Vitals   12/09/18 0630 12/09/18 0631 12/09/18 0632 12/09/18 0633  BP: 133/72     Pulse:   (!) 110 (!) 115  Resp:  17 (!) 29 (!) 34  Temp:      TempSrc:      SpO2:   100% 98%  Weight:      Height:        Isolation Precautions No active isolations  Medications Medications  insulin glargine (LANTUS) injection 7 Units (7 Units Subcutaneous Given 12/08/18 2242)  insulin aspart (novoLOG) injection 0-9 Units (0 Units Subcutaneous Not Given 12/09/18 0400)  levETIRAcetam (KEPPRA) IVPB 500 mg/100 mL premix (has no administration in time range)  labetalol (NORMODYNE) injection 10 mg (has no administration in time range)   stroke: mapping our early stages of recovery book (has no administration in time range)  0.9 %  sodium chloride infusion ( Intravenous New Bag/Given 12/09/18 0343)  acetaminophen (TYLENOL) tablet 650 mg (has no administration in time range)    Or  acetaminophen (TYLENOL) solution 650 mg (has no administration in time range)    Or  acetaminophen (TYLENOL) suppository 650 mg (has no administration in time range)  senna-docusate (Senokot-S) tablet 1 tablet (has no administration in time range)  heparin injection 5,000 Units (5,000 Units Subcutaneous Given 12/09/18 [id number])  aspirin suppository 300 mg (300 mg Rectal Given 12/08/18 2242)  haloperidol lactate (HALDOL) injection 2 mg (2 mg Intravenous Given 12/09/18 0459)  sodium chloride flush (NS) 0.9 % injection 3 mL (3 mLs Intravenous Given  12/08/18 2350)  iohexol (OMNIPAQUE) 350 MG/ML injection 75 mL (75 mLs Intravenous Contrast Given 12/08/18 1919)  sodium chloride 0.9 % bolus 1,000 mL (0 mLs Intravenous Stopped 12/08/18 2355)  LORazepam (ATIVAN) 2 MG/ML injection (2 mg  Given 12/08/18 2100)  levETIRAcetam (KEPPRA) IVPB 1500 mg/ 100 mL premix (0 mg Intravenous Stopped 12/08/18 2109)  sodium  chloride 0.9 % bolus 500 mL (500 mLs Intravenous New Bag/Given 12/09/18 0343)  sodium chloride 0.9 % bolus 500 mL (500 mLs Intravenous New Bag/Given 12/08/18 2350)  LORazepam (ATIVAN) injection 1 mg (1 mg Intravenous Given 12/09/18 0356)    Mobility non-ambulatory High fall risk   Focused Assessments Cardiac Assessment Handoff:    No results found for: CKTOTAL, CKMB, CKMBINDEX, TROPONINI No results found for: DDIMER Does the Patient currently have chest pain? No         R Recommendations: See Admitting Provider Note  Report given to:   Additional Notes: .

## 2018-12-09 NOTE — Progress Notes (Signed)
SLP Cancellation Note  Patient Details Name: Peter Nicholson MRN: [id number] DOB: 03/21/1941   Cancelled treatment:       Reason Eval/Treat Not Completed: Medical issues which prohibited therapy. Attempted to see pt x2 this morning. He is restless and agitated per nursing, who was administering IV meds to try to calm him. She says he has no PO meds ordered for now and recommends holding on evals. SLP will f/u as able.   Venita Sheffield Sherlie Boyum 12/09/2018, 11:35 AM  Pollyann Glen, M.A. [place] [facility] Environmental education officer [phone] Office [phone]

## 2018-12-09 NOTE — Progress Notes (Signed)
Called at 136pm 2nd attemp pt still agitated.  RN will notify dept when pt okay for EEG

## 2018-12-09 NOTE — Progress Notes (Signed)
STROKE TEAM PROGRESS NOTE   INTERVAL HISTORY Pt lying in bed, agitated, has been given several dose of haldol and ativan. Global aphasia. Moving all extremities but weaker on the right. CT showed progression of left MCA infarct comparing with 11/12/18. MRI pending. Tachycardia with agitation but no afib on tele.   Vitals:   12/09/18 0633 12/09/18 0826 12/09/18 1159 12/09/18 1203  BP:  (!) 168/71  (!) 147/92  Pulse: (!) 115 (!) 108  (!) 104  Resp: (!) 34 (!) 27  15  Temp:  98 F (36.7 C) 98.9 F (37.2 C)   TempSrc:  Oral Axillary   SpO2: 98% 99%  98%  Weight:      Height:        CBC:  Recent Labs  Lab 12/08/18 1827 12/08/18 1855  WBC 7.8  --   NEUTROABS 6.2  --   HGB 9.4* 10.2*  HCT 30.5* 30.0*  MCV 96.8  --   PLT 236  --     Basic Metabolic Panel:  Recent Labs  Lab 12/08/18 1827 12/08/18 1855 12/09/18 0335  NA 139 142 142  K 4.4 4.5 4.2  CL 108 108 112*  CO2 22  --  20*  GLUCOSE 277* 258* 146*  BUN 29* 29* 23  CREATININE 2.04* 1.90* 1.80*  CALCIUM 9.0  --  8.8*   Lipid Panel:     Component Value Date/Time   CHOL 92 12/09/2018 0335   TRIG 45 12/09/2018 0335   HDL 45 12/09/2018 0335   CHOLHDL 2.0 12/09/2018 0335   VLDL 9 12/09/2018 0335   LDLCALC 38 12/09/2018 0335   HgbA1c:  Lab Results  Component Value Date   HGBA1C 7.0 (H) 12/09/2018   Urine Drug Screen: No results found for: LABOPIA, COCAINSCRNUR, LABBENZ, AMPHETMU, THCU, LABBARB  Alcohol Level No results found for: ETH  IMAGING Ct Angio Head W Or Wo Contrast  Result Date: 12/08/2018 CLINICAL DATA:  Right-sided weakness and aphasia EXAM: CT ANGIOGRAPHY HEAD AND NECK TECHNIQUE: Multidetector CT imaging of the head and neck was performed using the standard protocol during bolus administration of intravenous contrast. Multiplanar CT image reconstructions and MIPs were obtained to evaluate the vascular anatomy. Carotid stenosis measurements (when applicable) are obtained utilizing NASCET criteria, using  the distal internal carotid diameter as the denominator. CONTRAST:  39mL OMNIPAQUE IOHEXOL 350 MG/ML SOLN COMPARISON:  CTA head neck 09/02/2018 FINDINGS: CTA NECK FINDINGS SKELETON: There is no bony spinal canal stenosis. No lytic or blastic lesion. OTHER NECK: Normal pharynx, larynx and major salivary glands. No cervical lymphadenopathy. Unremarkable thyroid gland. UPPER CHEST: No pneumothorax or pleural effusion. No nodules or masses. AORTIC ARCH: There is mild calcific atherosclerosis of the aortic arch. There is no aneurysm, dissection or hemodynamically significant stenosis of the visualized ascending aorta and aortic arch. Conventional 3 vessel aortic branching pattern. The visualized proximal subclavian arteries are widely patent. RIGHT CAROTID SYSTEM: --Common carotid artery: Widely patent origin without common carotid artery dissection or aneurysm. --Internal carotid artery: No dissection, occlusion or aneurysm. Mild atherosclerotic calcification at the carotid bifurcation without hemodynamically significant stenosis. --External carotid artery: No acute abnormality. LEFT CAROTID SYSTEM: --Common carotid artery: Widely patent origin without common carotid artery dissection or aneurysm. Mixed calcified and non-calcified atherosclerotic disease at the bifurcation, extending into the internal carotid artery, resulting in less than 50% stenosis. --Internal carotid artery: There is severe long segment narrowing the left internal carotid artery with an angiographic string sign. However, this is unchanged compared to  09/02/2018. The ICA is patent to the skull base. There is near total loss of enhancement at the petrous segment. --External carotid artery: No acute abnormality. VERTEBRAL ARTERIES: Left dominant configuration. Both origins are clearly patent. The right vertebral artery is occluded at the V3 V4 junction, unchanged. CTA HEAD FINDINGS POSTERIOR CIRCULATION: --Vertebral arteries: The right V4 segment is  occluded. There is severe stenosis of the left V4 segment secondary to atherosclerotic calcification. --Posterior inferior cerebellar arteries (PICA): Both are patent --Anterior inferior cerebellar arteries (AICA): Patent origins from the basilar artery. --Basilar artery: Normal. --Superior cerebellar arteries: Normal. --Posterior cerebral arteries: The right PCA is diminutive, unchanged. Left PCA is normal. ANTERIOR CIRCULATION: --Intracranial internal carotid arteries: The left internal carotid artery is occluded over a short segment at the distal petrous and cavernous segments. The carotid terminus is patent but severely narrowed, unchanged. --Anterior cerebral arteries (ACA): Non opacification of the left A1 segment, unchanged. Both distal anterior cerebral arteries are patent. The anterior communicating arteries patent. --Middle cerebral arteries (MCA): The right MCA is normal. Unchanged attenuated appearance of the left MCA without occlusion. VENOUS SINUSES: As permitted by contrast timing, patent. ANATOMIC VARIANTS: None Review of the MIP images confirms the above findings. IMPRESSION: 1. No emergent large vessel occlusion. 2. Unchanged severe long segment stenosis of the left internal carotid artery with areas of complete loss of enhancement at the skull base. 3. Unchanged occlusion of the right vertebral artery V4 segment and severe stenosis of the left V4 segment. 4. Unchanged narrowed left middle cerebral artery without occlusion. 5.  Aortic atherosclerosis (ICD10-I70.0). 6. These results were called by telephone at the time of interpretation on 12/08/2018 at 7:35 pm to Dr. Kerney Elbe , who verbally acknowledged these results. Electronically Signed   By: Ulyses Jarred [name]   On: 12/08/2018 19:51   Ct Angio Neck W Or Wo Contrast  Result Date: 12/08/2018 CLINICAL DATA:  Right-sided weakness and aphasia EXAM: CT ANGIOGRAPHY HEAD AND NECK TECHNIQUE: Multidetector CT imaging of the head and neck was  performed using the standard protocol during bolus administration of intravenous contrast. Multiplanar CT image reconstructions and MIPs were obtained to evaluate the vascular anatomy. Carotid stenosis measurements (when applicable) are obtained utilizing NASCET criteria, using the distal internal carotid diameter as the denominator. CONTRAST:  36mL OMNIPAQUE IOHEXOL 350 MG/ML SOLN COMPARISON:  CTA head neck 09/02/2018 FINDINGS: CTA NECK FINDINGS SKELETON: There is no bony spinal canal stenosis. No lytic or blastic lesion. OTHER NECK: Normal pharynx, larynx and major salivary glands. No cervical lymphadenopathy. Unremarkable thyroid gland. UPPER CHEST: No pneumothorax or pleural effusion. No nodules or masses. AORTIC ARCH: There is mild calcific atherosclerosis of the aortic arch. There is no aneurysm, dissection or hemodynamically significant stenosis of the visualized ascending aorta and aortic arch. Conventional 3 vessel aortic branching pattern. The visualized proximal subclavian arteries are widely patent. RIGHT CAROTID SYSTEM: --Common carotid artery: Widely patent origin without common carotid artery dissection or aneurysm. --Internal carotid artery: No dissection, occlusion or aneurysm. Mild atherosclerotic calcification at the carotid bifurcation without hemodynamically significant stenosis. --External carotid artery: No acute abnormality. LEFT CAROTID SYSTEM: --Common carotid artery: Widely patent origin without common carotid artery dissection or aneurysm. Mixed calcified and non-calcified atherosclerotic disease at the bifurcation, extending into the internal carotid artery, resulting in less than 50% stenosis. --Internal carotid artery: There is severe long segment narrowing the left internal carotid artery with an angiographic string sign. However, this is unchanged compared to 09/02/2018. The ICA is patent to  the skull base. There is near total loss of enhancement at the petrous segment. --External  carotid artery: No acute abnormality. VERTEBRAL ARTERIES: Left dominant configuration. Both origins are clearly patent. The right vertebral artery is occluded at the V3 V4 junction, unchanged. CTA HEAD FINDINGS POSTERIOR CIRCULATION: --Vertebral arteries: The right V4 segment is occluded. There is severe stenosis of the left V4 segment secondary to atherosclerotic calcification. --Posterior inferior cerebellar arteries (PICA): Both are patent --Anterior inferior cerebellar arteries (AICA): Patent origins from the basilar artery. --Basilar artery: Normal. --Superior cerebellar arteries: Normal. --Posterior cerebral arteries: The right PCA is diminutive, unchanged. Left PCA is normal. ANTERIOR CIRCULATION: --Intracranial internal carotid arteries: The left internal carotid artery is occluded over a short segment at the distal petrous and cavernous segments. The carotid terminus is patent but severely narrowed, unchanged. --Anterior cerebral arteries (ACA): Non opacification of the left A1 segment, unchanged. Both distal anterior cerebral arteries are patent. The anterior communicating arteries patent. --Middle cerebral arteries (MCA): The right MCA is normal. Unchanged attenuated appearance of the left MCA without occlusion. VENOUS SINUSES: As permitted by contrast timing, patent. ANATOMIC VARIANTS: None Review of the MIP images confirms the above findings. IMPRESSION: 1. No emergent large vessel occlusion. 2. Unchanged severe long segment stenosis of the left internal carotid artery with areas of complete loss of enhancement at the skull base. 3. Unchanged occlusion of the right vertebral artery V4 segment and severe stenosis of the left V4 segment. 4. Unchanged narrowed left middle cerebral artery without occlusion. 5.  Aortic atherosclerosis (ICD10-I70.0). 6. These results were called by telephone at the time of interpretation on 12/08/2018 at 7:35 pm to Dr. Kerney Elbe , who verbally acknowledged these results.  Electronically Signed   By: Ulyses Jarred [name]   On: 12/08/2018 19:51   Ct Head Code Stroke Wo Contrast  Result Date: 12/08/2018 CLINICAL DATA:  Code stroke. Ataxia. Right-sided weakness. Aphasia. Last seen normal 1400 hours. EXAM: CT HEAD WITHOUT CONTRAST TECHNIQUE: Contiguous axial images were obtained from the base of the skull through the vertex without intravenous contrast. COMPARISON:  11/12/2018 FINDINGS: Brain: Chronic small-vessel changes affect the pons. Old cerebellar infarctions right more than left. Old cortical and subcortical infarction in the left cerebral hemisphere in a watershed distribution and affecting the left parietal brain. Chronic small-vessel changes affect the white matter. I think there is some acute or subacute extension of the infarction in the left parietal lobe posterior to the region previously seen affected. No sign of mass lesion, hemorrhage, hydrocephalus or extra-axial collection. Vascular: There is atherosclerotic calcification of the major vessels at the base of the brain. Skull: Negative Sinuses/Orbits: Clear/normal Other: None ASPECTS ([place] Stroke Program Early CT Score) - Ganglionic level infarction (caudate, lentiform nuclei, internal capsule, insula, M1-M3 cortex): 7 - Supraganglionic infarction (M4-M6 cortex): 2 Total score (0-10 with 10 being normal): 9, allowing for the chronic changes. IMPRESSION: 1. Acute or subacute extension of infarction in the left parietal lobe posterior to the region previously affected. No evidence of hemorrhage or mass effect. 2. ASPECTS is difficult to apply in the setting of the extensive old infarctions, but the acute changes affect 1 MCA sector. 3. These results were communicated to Dr. Cheral Marker at [place] 8/27/2020by text page via the [facility] messaging system. Electronically Signed   By: Nelson Chimes [name]   On: 12/08/2018 18:48   PHYSICAL EXAM  Temp:  [98 F (36.7 C)-98.9 F (37.2 C)] 98.6 F (37 C) (08/28 1544) Pulse  Rate:  [  69-146] 98 (08/28 1544) Resp:  [13-37] 20 (08/28 1544) BP: (105-204)/(34-112) 156/70 (08/28 1544) SpO2:  [95 %-100 %] 98 % (08/28 1544) Weight:  [75.3 kg] 75.3 kg (08/27 2030)  General - Well nourished, well developed, agitated and restless.  Ophthalmologic - fundi not visualized due to noncooperation.  Cardiovascular - Regular rate and rhythm.   Neuro - agitated and restless, eyes closed, not open on command. He had global aphasia, not following commands and nonverbal. PERRL, eyes midposition, not tracking, doll's eye sluggish, positive corneal and gag and cough. Right nasolabial flattening. Not cooperative on tongue protrusion. Spontaneous moving all extremities, but left more than right upper and lower extremities. B/l positive babinski. Sensation, coordination and gait not tested.   ASSESSMENT/PLAN [name] is a 78 y.o. male with history of L MCA CVA 6 wks ago, hypertension, insulin-dependent diabetes mellitus, chronic kidney disease stage III, dementia, depression and anxiety presenting with sudden onset aphasia.   Stroke:   L MCA infarct extension secondary to large vessel disease source  Resultant global aphasia and right mild weakness  Code Stroke CT head acute/subacte extension L parietal lobe infarct.   CTA head & neck no ELVO. Unchanged L ICA segmental occlusion. Unchanged R VA V4 and L V4 occlusions. Unchanged narrow L MCA without occlusion. Aortic atherosclerosis.   MRI  Pending  2D Echo pending  LDL 38  HgbA1c 7.0  Heparin 5000 units sq tid for VTE prophylaxis  aspirin 81 mg daily and clopidogrel 75 mg daily prior to admission, now on aspirin 325 mg and plavix 75mg  daily. Continue DAPT  Therapy recommendations:  pending   Disposition:  pending   Carotid occulsion and intracranial vasculopathy  Left chronic ICA re-occlusion s/p CEA in the past  B/l V4 stenosis, left MCA stenosis  On DAPT  Avoid low BP  BP goal 130-150 given carotid  occlusion  History Stroke   07/1999 - BA occlusion, L ICA stenosis, asx, s/p L CEA  2002 VB TIA, angioplasty of lesion  02/2011 L brain TIA off Plavix in prep for leg surgery  02/2014 L MCA/ACA, ACA/PCA watershed infarcts thromboembolic secondary to proximal L ICA occlusion with resultantmoderate expressive aphasia. D/c on dual antiplatelets.  03/2014 multiple left frontal, parietal, temporal infarcts due to occluded left ICA.  MRI also showed right to be occluded.  Carotid Doppler left ICA occlusion, right ICA unremarkable.  EF within normal range.  LDL 113  08/2018 -  left MCA infarct and right punctate cerebellar infarct - known left ICA occlusion, bilateral V4 severe stenosis or occlusion/multivessel atherosclerosis and stenosis.   EEG negative.  A1c 7.1. discharged on DAPT Lipitor 80  Hx Seizure d/o  GTCS x 30 sec in ED  Loaded w/ keppra 1500->500 bid  EEG pending    Continue keppra   Follow[facility] Delice [facility] at [facility]  Hypertension  Stable . Permissive hypertension (OK if < 220/120) but gradually towards goal in 3-5 days . Long-term BP goal 130-150  Hyperlipidemia  Home meds:  Lipitor 80  Resume lipitor 80  LDL 38, goal < 70  Continue statin at discharge  Diabetes type II Controlled  HgbA1c 7.0, goal < 7.0  CBGs  SSI  Close PCP follow up  Dysphagia . Secondary to stroke . NPO . Speech on board . S/p cortrak today, on TF . Resume home meds   Other Stroke Risk Factors  Advanced age  Obstructive sleep apnea  PVD  Other Active Problems  AKI on CKD  III, Cre 2.04->1.90->1.80  Depression/anxiety  Dementia with in-hospital delirium - received multiple dose of haldol and [facility] day # 0  I spent  35 minutes in total face-to-face time with the patient, more than 50% of which was spent in counseling and coordination of care, reviewing test results, images and medication, and discussing the diagnosis of recurrent stroke, global aphasia,  left ICA occlusion, dysphagia, delirium, treatment plan and potential prognosis. This patient's care requiresreview of multiple databases, neurological assessment, discussion with family, other specialists and medical decision making of high complexity.   Rosalin Hawking, MD PhD Stroke Neurology 12/09/2018 5:56 PM    To contact Stroke Continuity provider, please refer to [email or web address] After hours, contact General Neurology

## 2018-12-09 NOTE — Progress Notes (Signed)
Initial Nutrition Assessment  DOCUMENTATION CODES:   Not applicable  INTERVENTION:  -Initiate Osmolite 1.5 at 40 ml/hr, advance 10 ml/hr every 8 hr to goal rate 55 ml/hr -Prostat 30 ml daily via tube  Tube feed regimen at goal rate provides 2080 kcal 98 grams protein 1003 ml free water from formula  Water flushes per MD   NUTRITION DIAGNOSIS:   Inadequate oral intake related to inability to eat as evidenced by NPO status.   GOAL:   Patient will meet greater than or equal to 90% of their needs   MONITOR:   TF tolerance, Labs, Weight trends, I & O's, Skin  REASON FOR ASSESSMENT:   Consult Enteral/tube feeding initiation and management  ASSESSMENT:  78 year old male with medical history significant for HTN, IDDM, CKD3, dementia, depression, anxiety, history of CVA, carotid endarterectomy, cholecystectomy, nephrectomy, presented to ED for evaluation of worsened right-sided weakness and speech difficulty; CT of head shows subacute extension of infarction left parietal lobe  Patient had left MCA stroke 6 weeks ago and history of seizures per his wife. Patient reported to have seizure x 2 yesterday.  Per chart review; patient remains persistently agitated, climbing out of bed, wrist restraints placed for patient safety;  cancelled SLP eval and EEG today due to agitation of patient.   8/28 Cortrak  Medications reviewed and include: SSI, lantus 7 units daily, ativan, keppra PRN haldol, labetalol, ativan  Labs: CBGS 107-165 Ca corrects for low albumin 9.44   NUTRITION - FOCUSED PHYSICAL EXAM: Unable to complete at this time due to patient current AMS   Diet Order:   Diet Order            Diet NPO time specified  Diet effective now              EDUCATION NEEDS:   No education needs have been identified at this time  Skin:  Skin Assessment: Reviewed RN Assessment  Last BM:  PTA  Height:   Ht Readings from Last 1 Encounters:  12/08/18 5\' 6"  (1.676 m)     Weight:   Wt Readings from Last 1 Encounters:  12/08/18 75.3 kg    Ideal Body Weight:  64.5 kg  BMI:  Body mass index is 26.79 kg/m.  Estimated Nutritional Needs:   Kcal:  1900-2100  Protein:  95-105  Fluid:  >1.9L   Lajuan Lines, RD, LDN  After Hours/Weekend Pager: [phone]

## 2018-12-09 NOTE — Progress Notes (Signed)
PROGRESS NOTE    Peter Nicholson  [id number] DOB: Aug 20, 1940 DOA: 12/08/2018 PCP: No primary care provider on file.    Brief Narrative:  78 year old male with history of hypertension, insulin-dependent type 2 diabetes, CKD stage III, dementia, depression anxiety and recently stroke on 5/20 presented to the emergency department for evaluation of worsening right-sided weakness, difficult to talk.  Has history of dementia.  Wife noted acute onset aphasia and increasing difficulty with walking.  No seizures at home.  Upon arrival to the emergency room, patient was afebrile, on room air.  Slight renal insufficiency.  Stable normocytic anemia.  CT head showed acute or subacute extension of infarction of left parietal lobe.  No emergent large vessel occlusion.  While in the emergency room, he had 2 witnessed generalized tonic-clonic seizure.  Remains encephalopathic since then.   Assessment & Plan:   Principal Problem:   Ischemic stroke ([place]) Active Problems:   Insulin-requiring or dependent type II diabetes mellitus ([place])   Essential hypertension   Vascular dementia ([place])   Depression with anxiety   Acute renal failure superimposed on stage 3 chronic kidney disease (HCC)   Normocytic anemia   Seizure ([place])   Acute ischemic stroke ([place])  Acute ischemic stroke with right-sided weakness and aphasia: Clinical findings, aphasia, encephalopathy and right-sided weakness CT head findings, acute/subacute extension of prior left MCA stroke MRI of the brain, unable to do due to severe agitation, may not change outcome.  Will do whenever possible. CTA head and neck: Chronic bilateral occlusion with severe atherosclerotic disease unchanged from before. 2D echocardiogram, pending. Antiplatelet therapy, on aspirin and Plavix at home.  Continue rectal aspirin, will start Plavix once NG tube in. LDL 38, already on a statin.  Hemoglobin A1c, 7.  At goal on insulin. Appreciate neurology input.  Continue  to work with the speech, PT OT.  Anticipate prolonged recovery.   Generalized tonic-clonic seizure: Probably with lower seizure threshold with cortical stroke.  2 witnessed tonic-clonic seizure.  Loaded with Keppra.  Currently remains on Keppra 500 mg twice a day.  EEG when able.  Acute metabolic encephalopathy: In the setting of underlying dementia. Probably due to stroke and seizure.  Patient is restless, agitated, climbing out of the bed. Ativan and Haldol as needed.  Soft restraint to protect lines and injuries.  Air cabin crew. Family to spend time with patient.  AKI on CKD stage III: On maintenance IV fluids with improvement.  Continue monitoring.  Check magnesium and phosphorus.  Insulin-dependent type 2 diabetes: Well controlled on insulin.  On decreased dose of insulin while n.p.o.  Hypertension: Permissive hypertension.  Not resuming any blood pressure medications.  Depression/anxiety with underlying dementia: Poor cognitive reserve.  Anticipate prolonged recovery.  Will resume home medications when able to have core track placed.  Hold MRI and EEG because of severe agitation. Soft restraint as needed. N.p.o., core track team consulted, start NG tube feeding and medications once able. Maintenance IV fluids.  DVT prophylaxis: Heparin subcu Code Status: Full code Family Communication: Wife on the phone Disposition Plan: Unknown at this time.  Skilled nursing facility after stabilization   Consultants:   Neurology  Procedures:   None  Antimicrobials:   None   Subjective: Patient was seen and examined.  Remains persistently agitated, climbing out of the bed, unable to provide any meaningful history.  Unable to follow any commands.  [place].  Called and discussed case in detail with patient's wife.  Objective: Vitals:   12/09/18 [id number] 12/09/18  [id number] 12/09/18 1159 12/09/18 1203  BP:  (!) 168/71  (!) 147/92  Pulse: (!) 115 (!) 108  (!) 104  Resp: (!) 34 (!) 27  15    Temp:  98 F (36.7 C) 98.9 F (37.2 C)   TempSrc:  Oral Axillary   SpO2: 98% 99%  98%  Weight:      Height:        Intake/Output Summary (Last 24 hours) at 12/09/2018 1329 Last data filed at 12/09/2018 0830 Gross per 24 hour  Intake --  Output 550 ml  Net -550 ml   Filed Weights   12/08/18 2030  Weight: 75.3 kg    Examination:  General exam: Appears restless, agitated, sick looking. Respiratory system: Clear to auscultation. Respiratory effort normal.  Mostly conducted airway sounds. Cardiovascular system: S1 & S2 heard, RRR.  Tachycardic.  No JVD, murmurs, rubs, gallops or clicks. No pedal edema. Gastrointestinal system: Abdomen is nondistended, soft and nontender. No organomegaly or masses felt. Normal bowel sounds heard. Central nervous system: Agitated and restless.  Dense receptive and expressive aphasia.  Unable to follow commands.  Unintelligible mumbling.  Confused and agitated. Moving all extremities, however right extremity appears to be weaker than left. Skin: No rashes, lesions or ulcers Psychiatry: Judgement and insight appear compromised.       Data Reviewed: I have personally reviewed following labs and imaging studies  CBC: Recent Labs  Lab 12/08/18 1827 12/08/18 1855  WBC 7.8  --   NEUTROABS 6.2  --   HGB 9.4* 10.2*  HCT 30.5* 30.0*  MCV 96.8  --   PLT 236  --    Basic Metabolic Panel: Recent Labs  Lab 12/08/18 1827 12/08/18 1855 12/09/18 0335  NA 139 142 142  K 4.4 4.5 4.2  CL 108 108 112*  CO2 22  --  20*  GLUCOSE 277* 258* 146*  BUN 29* 29* 23  CREATININE 2.04* 1.90* 1.80*  CALCIUM 9.0  --  8.8*   GFR: Estimated Creatinine Clearance: 30 mL/min (A) (by C-G formula based on SCr of 1.8 mg/dL (H)). Liver Function Tests: Recent Labs  Lab 12/08/18 1827 12/09/18 0335  AST 19 25  ALT 19 19  ALKPHOS 86 82  BILITOT 0.5 0.3  PROT 6.3* 5.8*  ALBUMIN 3.4* 3.2*   No results for input(s): LIPASE, AMYLASE in the last 168 hours. No  results for input(s): AMMONIA in the last 168 hours. Coagulation Profile: Recent Labs  Lab 12/08/18 1827  INR 1.0   Cardiac Enzymes: No results for input(s): CKTOTAL, CKMB, CKMBINDEX, TROPONINI in the last 168 hours. BNP (last 3 results) No results for input(s): PROBNP in the last 8760 hours. HbA1C: Recent Labs    12/09/18 0335  HGBA1C 7.0*   CBG: Recent Labs  Lab 12/08/18 2108 12/08/18 2327 12/09/18 0347 12/09/18 1149  GLUCAP 165* 107* 131* 151*   Lipid Profile: Recent Labs    12/09/18 0335  CHOL 92  HDL 45  LDLCALC 38  TRIG 45  CHOLHDL 2.0   Thyroid Function Tests: No results for input(s): TSH, T4TOTAL, FREET4, T3FREE, THYROIDAB in the last 72 hours. Anemia Panel: No results for input(s): VITAMINB12, FOLATE, FERRITIN, TIBC, IRON, RETICCTPCT in the last 72 hours. Sepsis Labs: No results for input(s): PROCALCITON, LATICACIDVEN in the last 168 hours.  Recent Results (from the past 240 hour(s))  SARS Coronavirus 2 [facility] order, Performed in [facility] hospital lab) Nasopharyngeal Nasopharyngeal Swab     Status: None   Collection Time:  12/08/18  8:45 PM   Specimen: Nasopharyngeal Swab  Result Value Ref Range Status   SARS Coronavirus 2 NEGATIVE NEGATIVE Final    Comment: (NOTE) If result is NEGATIVE SARS-CoV-2 target nucleic acids are NOT DETECTED. The SARS-CoV-2 RNA is generally detectable in upper and lower  respiratory specimens during the acute phase of infection. The lowest  concentration of SARS-CoV-2 viral copies this assay can detect is 250  copies / mL. A negative result does not preclude SARS-CoV-2 infection  and should not be used as the sole basis for treatment or other  patient management decisions.  A negative result may occur with  improper specimen collection / handling, submission of specimen other  than nasopharyngeal swab, presence of viral mutation(s) within the  areas targeted by this assay, and inadequate number of viral copies    (<250 copies / mL). A negative result must be combined with clinical  observations, patient history, and epidemiological information. If result is POSITIVE SARS-CoV-2 target nucleic acids are DETECTED. The SARS-CoV-2 RNA is generally detectable in upper and lower  respiratory specimens dur ing the acute phase of infection.  Positive  results are indicative of active infection with SARS-CoV-2.  Clinical  correlation with patient history and other diagnostic information is  necessary to determine patient infection status.  Positive results do  not rule out bacterial infection or co-infection with other viruses. If result is PRESUMPTIVE POSTIVE SARS-CoV-2 nucleic acids MAY BE PRESENT.   A presumptive positive result was obtained on the submitted specimen  and confirmed on repeat testing.  While 2019 novel coronavirus  (SARS-CoV-2) nucleic acids may be present in the submitted sample  additional confirmatory testing may be necessary for epidemiological  and / or clinical management purposes  to differentiate between  SARS-CoV-2 and other Sarbecovirus currently known to infect humans.  If clinically indicated additional testing with an alternate test  methodology [phone]) is advised. The SARS-CoV-2 RNA is generally  detectable in upper and lower respiratory sp ecimens during the acute  phase of infection. The expected result is Negative. Fact Sheet for Patients:  StrictlyIdeas.no Fact Sheet for Healthcare Providers: BankingDealers.co.za This test is not yet approved or cleared by the [place] FDA and has been authorized for detection and/or diagnosis of SARS-CoV-2 by FDA under an Emergency Use Authorization (EUA).  This EUA will remain in effect (meaning this test can be used) for the duration of the COVID-19 declaration under Section 564(b)(1) of the Act, 21 U.S.C. section 360bbb-3(b)(1), unless the authorization is terminated  or revoked sooner. Performed at [facility], [place] [address] [place], [place] [address]          Radiology Studies: Ct Angio Head W Or Wo Contrast  Result Date: 12/08/2018 CLINICAL DATA:  Right-sided weakness and aphasia EXAM: CT ANGIOGRAPHY HEAD AND NECK TECHNIQUE: Multidetector CT imaging of the head and neck was performed using the standard protocol during bolus administration of intravenous contrast. Multiplanar CT image reconstructions and MIPs were obtained to evaluate the vascular anatomy. Carotid stenosis measurements (when applicable) are obtained utilizing NASCET criteria, using the distal internal carotid diameter as the denominator. CONTRAST:  91mL OMNIPAQUE IOHEXOL 350 MG/ML SOLN COMPARISON:  CTA head neck 09/02/2018 FINDINGS: CTA NECK FINDINGS SKELETON: There is no bony spinal canal stenosis. No lytic or blastic lesion. OTHER NECK: Normal pharynx, larynx and major salivary glands. No cervical lymphadenopathy. Unremarkable thyroid gland. UPPER CHEST: No pneumothorax or pleural effusion. No nodules or masses. AORTIC ARCH: There is mild calcific  atherosclerosis of the aortic arch. There is no aneurysm, dissection or hemodynamically significant stenosis of the visualized ascending aorta and aortic arch. Conventional 3 vessel aortic branching pattern. The visualized proximal subclavian arteries are widely patent. RIGHT CAROTID SYSTEM: --Common carotid artery: Widely patent origin without common carotid artery dissection or aneurysm. --Internal carotid artery: No dissection, occlusion or aneurysm. Mild atherosclerotic calcification at the carotid bifurcation without hemodynamically significant stenosis. --External carotid artery: No acute abnormality. LEFT CAROTID SYSTEM: --Common carotid artery: Widely patent origin without common carotid artery dissection or aneurysm. Mixed calcified and non-calcified atherosclerotic disease at the bifurcation, extending into the internal carotid  artery, resulting in less than 50% stenosis. --Internal carotid artery: There is severe long segment narrowing the left internal carotid artery with an angiographic string sign. However, this is unchanged compared to 09/02/2018. The ICA is patent to the skull base. There is near total loss of enhancement at the petrous segment. --External carotid artery: No acute abnormality. VERTEBRAL ARTERIES: Left dominant configuration. Both origins are clearly patent. The right vertebral artery is occluded at the V3 V4 junction, unchanged. CTA HEAD FINDINGS POSTERIOR CIRCULATION: --Vertebral arteries: The right V4 segment is occluded. There is severe stenosis of the left V4 segment secondary to atherosclerotic calcification. --Posterior inferior cerebellar arteries (PICA): Both are patent --Anterior inferior cerebellar arteries (AICA): Patent origins from the basilar artery. --Basilar artery: Normal. --Superior cerebellar arteries: Normal. --Posterior cerebral arteries: The right PCA is diminutive, unchanged. Left PCA is normal. ANTERIOR CIRCULATION: --Intracranial internal carotid arteries: The left internal carotid artery is occluded over a short segment at the distal petrous and cavernous segments. The carotid terminus is patent but severely narrowed, unchanged. --Anterior cerebral arteries (ACA): Non opacification of the left A1 segment, unchanged. Both distal anterior cerebral arteries are patent. The anterior communicating arteries patent. --Middle cerebral arteries (MCA): The right MCA is normal. Unchanged attenuated appearance of the left MCA without occlusion. VENOUS SINUSES: As permitted by contrast timing, patent. ANATOMIC VARIANTS: None Review of the MIP images confirms the above findings. IMPRESSION: 1. No emergent large vessel occlusion. 2. Unchanged severe long segment stenosis of the left internal carotid artery with areas of complete loss of enhancement at the skull base. 3. Unchanged occlusion of the right  vertebral artery V4 segment and severe stenosis of the left V4 segment. 4. Unchanged narrowed left middle cerebral artery without occlusion. 5.  Aortic atherosclerosis (ICD10-I70.0). 6. These results were called by telephone at the time of interpretation on 12/08/2018 at 7:35 pm to Dr. Kerney Elbe , who verbally acknowledged these results. Electronically Signed   By: Ulyses Jarred [name]   On: 12/08/2018 19:51   Ct Angio Neck W Or Wo Contrast  Result Date: 12/08/2018 CLINICAL DATA:  Right-sided weakness and aphasia EXAM: CT ANGIOGRAPHY HEAD AND NECK TECHNIQUE: Multidetector CT imaging of the head and neck was performed using the standard protocol during bolus administration of intravenous contrast. Multiplanar CT image reconstructions and MIPs were obtained to evaluate the vascular anatomy. Carotid stenosis measurements (when applicable) are obtained utilizing NASCET criteria, using the distal internal carotid diameter as the denominator. CONTRAST:  57mL OMNIPAQUE IOHEXOL 350 MG/ML SOLN COMPARISON:  CTA head neck 09/02/2018 FINDINGS: CTA NECK FINDINGS SKELETON: There is no bony spinal canal stenosis. No lytic or blastic lesion. OTHER NECK: Normal pharynx, larynx and major salivary glands. No cervical lymphadenopathy. Unremarkable thyroid gland. UPPER CHEST: No pneumothorax or pleural effusion. No nodules or masses. AORTIC ARCH: There is mild calcific atherosclerosis of the aortic arch. There  is no aneurysm, dissection or hemodynamically significant stenosis of the visualized ascending aorta and aortic arch. Conventional 3 vessel aortic branching pattern. The visualized proximal subclavian arteries are widely patent. RIGHT CAROTID SYSTEM: --Common carotid artery: Widely patent origin without common carotid artery dissection or aneurysm. --Internal carotid artery: No dissection, occlusion or aneurysm. Mild atherosclerotic calcification at the carotid bifurcation without hemodynamically significant stenosis.  --External carotid artery: No acute abnormality. LEFT CAROTID SYSTEM: --Common carotid artery: Widely patent origin without common carotid artery dissection or aneurysm. Mixed calcified and non-calcified atherosclerotic disease at the bifurcation, extending into the internal carotid artery, resulting in less than 50% stenosis. --Internal carotid artery: There is severe long segment narrowing the left internal carotid artery with an angiographic string sign. However, this is unchanged compared to 09/02/2018. The ICA is patent to the skull base. There is near total loss of enhancement at the petrous segment. --External carotid artery: No acute abnormality. VERTEBRAL ARTERIES: Left dominant configuration. Both origins are clearly patent. The right vertebral artery is occluded at the V3 V4 junction, unchanged. CTA HEAD FINDINGS POSTERIOR CIRCULATION: --Vertebral arteries: The right V4 segment is occluded. There is severe stenosis of the left V4 segment secondary to atherosclerotic calcification. --Posterior inferior cerebellar arteries (PICA): Both are patent --Anterior inferior cerebellar arteries (AICA): Patent origins from the basilar artery. --Basilar artery: Normal. --Superior cerebellar arteries: Normal. --Posterior cerebral arteries: The right PCA is diminutive, unchanged. Left PCA is normal. ANTERIOR CIRCULATION: --Intracranial internal carotid arteries: The left internal carotid artery is occluded over a short segment at the distal petrous and cavernous segments. The carotid terminus is patent but severely narrowed, unchanged. --Anterior cerebral arteries (ACA): Non opacification of the left A1 segment, unchanged. Both distal anterior cerebral arteries are patent. The anterior communicating arteries patent. --Middle cerebral arteries (MCA): The right MCA is normal. Unchanged attenuated appearance of the left MCA without occlusion. VENOUS SINUSES: As permitted by contrast timing, patent. ANATOMIC VARIANTS: None  Review of the MIP images confirms the above findings. IMPRESSION: 1. No emergent large vessel occlusion. 2. Unchanged severe long segment stenosis of the left internal carotid artery with areas of complete loss of enhancement at the skull base. 3. Unchanged occlusion of the right vertebral artery V4 segment and severe stenosis of the left V4 segment. 4. Unchanged narrowed left middle cerebral artery without occlusion. 5.  Aortic atherosclerosis (ICD10-I70.0). 6. These results were called by telephone at the time of interpretation on 12/08/2018 at 7:35 pm to Dr. Kerney Elbe , who verbally acknowledged these results. Electronically Signed   By: Ulyses Jarred [name]   On: 12/08/2018 19:51   Ct Head Code Stroke Wo Contrast  Result Date: 12/08/2018 CLINICAL DATA:  Code stroke. Ataxia. Right-sided weakness. Aphasia. Last seen normal 1400 hours. EXAM: CT HEAD WITHOUT CONTRAST TECHNIQUE: Contiguous axial images were obtained from the base of the skull through the vertex without intravenous contrast. COMPARISON:  11/12/2018 FINDINGS: Brain: Chronic small-vessel changes affect the pons. Old cerebellar infarctions right more than left. Old cortical and subcortical infarction in the left cerebral hemisphere in a watershed distribution and affecting the left parietal brain. Chronic small-vessel changes affect the white matter. I think there is some acute or subacute extension of the infarction in the left parietal lobe posterior to the region previously seen affected. No sign of mass lesion, hemorrhage, hydrocephalus or extra-axial collection. Vascular: There is atherosclerotic calcification of the major vessels at the base of the brain. Skull: Negative Sinuses/Orbits: Clear/normal Other: None ASPECTS [facility] Stroke Program Early  CT Score) - Ganglionic level infarction (caudate, lentiform nuclei, internal capsule, insula, M1-M3 cortex): 7 - Supraganglionic infarction (M4-M6 cortex): 2 Total score (0-10 with 10 being normal):  9, allowing for the chronic changes. IMPRESSION: 1. Acute or subacute extension of infarction in the left parietal lobe posterior to the region previously affected. No evidence of hemorrhage or mass effect. 2. ASPECTS is difficult to apply in the setting of the extensive old infarctions, but the acute changes affect 1 MCA sector. 3. These results were communicated to Dr. Cheral Marker at [place] 8/27/2020by text page via the [facility] messaging system. Electronically Signed   By: Nelson Chimes [name]   On: 12/08/2018 18:48        Scheduled Meds:   stroke: mapping our early stages of recovery book   Does not apply Once   aspirin  300 mg Rectal Daily   heparin  5,000 Units Subcutaneous Q8H   insulin aspart  0-9 Units Subcutaneous Q4H   insulin glargine  7 Units Subcutaneous QHS   LORazepam       Continuous Infusions:  levETIRAcetam 500 mg (12/09/18 1000)     LOS: 0 days    Time spent: 35 minutes     Barb Merino, MD [facility] Pager [phone]  If 7PM-7AM, please contact night-coverage [email or web address] Password [email or web address] 12/09/2018, 1:29 PM

## 2018-12-09 NOTE — Progress Notes (Signed)
Nurse advised EEG tech that pt is too agitated at this time; will attempt EEG later as schedule permits.

## 2018-12-09 NOTE — Procedures (Signed)
Patient still moving around in bed and is unable to cooperate. Unable to complete echo at this time. Will attempt again later.

## 2018-12-09 NOTE — Procedures (Signed)
Cortrak  Person Inserting Tube:  Peter Nicholson, RD Tube Type:  Cortrak - 43 inches Tube Location:  Left nare Initial Placement:  Stomach Secured by: Bridle Technique Used to Measure Tube Placement:  Documented cm marking at nare/ corner of mouth Cortrak Secured At:  68 cm    Cortrak Tube Team Note:  Consult received to place a Cortrak feeding tube.   No x-ray is required. RN may begin using tube.   If the tube becomes dislodged please keep the tube and contact the Cortrak team at [email or web address] (password [email or web address]) for replacement.  If after hours and replacement cannot be delayed, place a NG tube and confirm placement with an abdominal x-ray.    Peter Nicholson RD, LDN, CNSC [phone] Pager [phone] After Hours Pager   

## 2018-12-09 NOTE — Procedures (Signed)
Echo attempted. Patient with dietician. Will attempt again later.

## 2018-12-10 ENCOUNTER — Inpatient Hospital Stay (HOSPITAL_COMMUNITY): Payer: Medicare Other

## 2018-12-10 DIAGNOSIS — I10 Essential (primary) hypertension: Secondary | ICD-10-CM

## 2018-12-10 DIAGNOSIS — F0151 Vascular dementia with behavioral disturbance: Secondary | ICD-10-CM

## 2018-12-10 DIAGNOSIS — F05 Delirium due to known physiological condition: Secondary | ICD-10-CM

## 2018-12-10 LAB — CBC WITH DIFFERENTIAL/PLATELET
Abs Immature Granulocytes: 0.03 10*3/uL (ref 0.00–0.07)
Basophils Absolute: 0.1 10*3/uL (ref 0.0–0.1)
Basophils Relative: 1 %
Eosinophils Absolute: 0.3 10*3/uL (ref 0.0–0.5)
Eosinophils Relative: 3 %
HCT: 29.1 % — ABNORMAL LOW (ref 39.0–52.0)
Hemoglobin: 8.9 g/dL — ABNORMAL LOW (ref 13.0–17.0)
Immature Granulocytes: 0 %
Lymphocytes Relative: 7 %
Lymphs Abs: 0.7 10*3/uL (ref 0.7–4.0)
MCH: 29.4 pg (ref 26.0–34.0)
MCHC: 30.6 g/dL (ref 30.0–36.0)
MCV: 96 fL (ref 80.0–100.0)
Monocytes Absolute: 1 10*3/uL (ref 0.1–1.0)
Monocytes Relative: 10 %
Neutro Abs: 8 10*3/uL — ABNORMAL HIGH (ref 1.7–7.7)
Neutrophils Relative %: 79 %
Platelets: 217 10*3/uL (ref 150–400)
RBC: 3.03 MIL/uL — ABNORMAL LOW (ref 4.22–5.81)
RDW: 13.7 % (ref 11.5–15.5)
WBC: 10.1 10*3/uL (ref 4.0–10.5)
nRBC: 0 % (ref 0.0–0.2)

## 2018-12-10 LAB — GLUCOSE, CAPILLARY
Glucose-Capillary: 171 mg/dL — ABNORMAL HIGH (ref 70–99)
Glucose-Capillary: 176 mg/dL — ABNORMAL HIGH (ref 70–99)
Glucose-Capillary: 200 mg/dL — ABNORMAL HIGH (ref 70–99)
Glucose-Capillary: 206 mg/dL — ABNORMAL HIGH (ref 70–99)
Glucose-Capillary: 236 mg/dL — ABNORMAL HIGH (ref 70–99)
Glucose-Capillary: 250 mg/dL — ABNORMAL HIGH (ref 70–99)
Glucose-Capillary: 250 mg/dL — ABNORMAL HIGH (ref 70–99)

## 2018-12-10 LAB — BASIC METABOLIC PANEL
Anion gap: 9 (ref 5–15)
BUN: 24 mg/dL — ABNORMAL HIGH (ref 8–23)
CO2: 23 mmol/L (ref 22–32)
Calcium: 9 mg/dL (ref 8.9–10.3)
Chloride: 110 mmol/L (ref 98–111)
Creatinine, Ser: 1.71 mg/dL — ABNORMAL HIGH (ref 0.61–1.24)
GFR calc Af Amer: 43 mL/min — ABNORMAL LOW (ref >60)
GFR calc non Af Amer: 37 mL/min — ABNORMAL LOW (ref >60)
Glucose, Bld: 226 mg/dL — ABNORMAL HIGH (ref 70–99)
Potassium: 4.1 mmol/L (ref 3.5–5.1)
Sodium: 142 mmol/L (ref 135–145)

## 2018-12-10 LAB — ECHOCARDIOGRAM COMPLETE
Height: 66 in
Weight: 2680.79 oz

## 2018-12-10 LAB — PHOSPHORUS: Phosphorus: 2.6 mg/dL (ref 2.5–4.6)

## 2018-12-10 LAB — MAGNESIUM: Magnesium: 1.4 mg/dL — ABNORMAL LOW (ref 1.7–2.4)

## 2018-12-10 MED ORDER — ASPIRIN 81 MG PO CHEW
324.0000 mg | CHEWABLE_TABLET | Freq: Every day | ORAL | Status: DC
  Administered 2018-12-11: 324 mg
  Filled 2018-12-10 (×2): qty 4

## 2018-12-10 MED ORDER — METOPROLOL TARTRATE 5 MG/5ML IV SOLN
5.0000 mg | Freq: Once | INTRAVENOUS | Status: AC
  Administered 2018-12-11: 5 mg via INTRAVENOUS
  Filled 2018-12-10: qty 5

## 2018-12-10 MED ORDER — ATORVASTATIN CALCIUM 80 MG PO TABS
80.0000 mg | ORAL_TABLET | Freq: Every day | ORAL | Status: DC
  Administered 2018-12-10: 17:00:00 80 mg
  Filled 2018-12-10 (×2): qty 1

## 2018-12-10 MED ORDER — CLOPIDOGREL BISULFATE 75 MG PO TABS
75.0000 mg | ORAL_TABLET | Freq: Every day | ORAL | Status: DC
  Administered 2018-12-11: 10:00:00 75 mg
  Filled 2018-12-10: qty 1

## 2018-12-10 MED ORDER — SENNOSIDES-DOCUSATE SODIUM 8.6-50 MG PO TABS: 1.0000 | ORAL_TABLET | Freq: Every evening | ORAL | Status: DC | PRN

## 2018-12-10 NOTE — Progress Notes (Signed)
Spoke to MRI, stated will set up transport in 1 hour   MD aware

## 2018-12-10 NOTE — Evaluation (Signed)
Physical Therapy Evaluation Patient Details Name: Peter Nicholson MRN: [id number] DOB: 10-01-1940 Today's Date: 12/10/2018   History of Present Illness  Pt is a 78 yo male w/ Global aphasia. Moving all extremities but weaker on the right. CT showed progression of left MCA infarct comparing with 11/12/18. In ER, pt with 2 witnessed seizures. PMHx: multiple CVAs, HTN, HLD, T2DM.  Clinical Impression  Pt admitted with above diagnosis. PT eval limited today by pt lethargy from meds. Total A to roll in bed for bowel clean up. Roused minimally with this activity. Total A for supine to sit but able to progress in sitting from mod A with posterior right lean to min-guard A and expressed being cold with an affirmative shake of the head. Pt also voluntarily flexed and extended left knee in sitting. Flexed right knee as well but did not visualize any right knee extension. Will evaluate further when pt more alert. For now recommending SNF level 24 hr care.  Pt currently with functional limitations due to the deficits listed below (see PT Problem List). Pt will benefit from skilled PT to increase their independence and safety with mobility to allow discharge to the venue listed below.       Follow Up Recommendations SNF;Supervision/Assistance - 24 hour    Equipment Recommendations  None recommended by PT    Recommendations for Other Services       Precautions / Restrictions Precautions Precautions: Fall;Other (comment) Precaution Comments: seizure; mitts on hands; cortrak Restrictions Weight Bearing Restrictions: No      Mobility  Bed Mobility Overal bed mobility: Needs Assistance Bed Mobility: Rolling;Supine to Sit;Sit to Supine Rolling: Total assist;+2 for physical assistance   Supine to sit: Total assist;+2 for physical assistance Sit to supine: Total assist;+2 for physical assistance   General bed mobility comments: total A for mobility and pt aroused only slightly with movement and cleaning  up of BM  Transfers                 General transfer comment: deferred  Ambulation/Gait                Stairs            Wheelchair Mobility    Modified Rankin (Stroke Patients Only) Modified Rankin (Stroke Patients Only) Pre-Morbid Rankin Score: Moderate disability Modified Rankin: Severe disability     Balance Overall balance assessment: Needs assistance Sitting-balance support: Single extremity supported;Feet supported Sitting balance-Leahy Scale: Poor Sitting balance - Comments: mod A initially with posterior right lean,  progressed to minguard A before returning to supine Postural control: Posterior lean;Right lateral lean                                   Pertinent Vitals/Pain Pain Assessment: Faces Faces Pain Scale: No hurt    Home Living Family/patient expects to be discharged to:: Private residence                 Additional Comments: unable to obtain from pt due to decreased level of arousal.    Prior Function           Comments: per chart, pt was ambulatory at home after recent CVA     Hand Dominance   Dominant Hand: Right    Extremity/Trunk Assessment   Upper Extremity Assessment Upper Extremity Assessment: Defer to OT evaluation RUE Deficits / Details: 0/5 MM grade noted; increased tone in  elbow (IV site) RUE Coordination: decreased fine motor;decreased gross motor LUE Deficits / Details: weakness 3-/5 MM grade; grip weak 2+/5 MM grade LUE Coordination: decreased fine motor;decreased gross motor    Lower Extremity Assessment Lower Extremity Assessment: RLE deficits/detail;LLE deficits/detail RLE Deficits / Details: pt not following commands but voluntarily slid R foot back under him in sitting EOB LLE Deficits / Details: voluntarily extending and flexing L knee in sitting EOB    Cervical / Trunk Assessment Cervical / Trunk Assessment: Kyphotic  Communication   Communication: Other  (comment)(decreased level of arousal)  Cognition Arousal/Alertness: Lethargic;Suspect due to medications Behavior During Therapy: Flat affect Overall Cognitive Status: Difficult to assess                                 General Comments: Pt decreased level of arousal, not alerting to sternal rub or sitting up at EOB for more than a few seconds.       General Comments      Exercises     Assessment/Plan    PT Assessment Patient needs continued PT services  PT Problem List Decreased strength;Decreased range of motion;Decreased activity tolerance;Decreased balance;Decreased mobility;Decreased coordination;Decreased cognition;Decreased knowledge of precautions       PT Treatment Interventions DME instruction;Gait training;Functional mobility training;Therapeutic activities;Therapeutic exercise;Balance training;Neuromuscular re-education;Cognitive remediation;Patient/family education    PT Goals (Current goals can be found in the Care Plan section)  Acute Rehab PT Goals Patient Stated Goal: unable to state PT Goal Formulation: Patient unable to participate in goal setting Time For Goal Achievement: 12/24/18 Potential to Achieve Goals: Fair    Frequency Min 3X/week   Barriers to discharge        Co-evaluation PT/OT/SLP Co-Evaluation/Treatment: Yes Reason for Co-Treatment: Complexity of the patient's impairments (multi-system involvement);For patient/therapist safety;Necessary to address cognition/behavior during functional activity PT goals addressed during session: Mobility/safety with mobility;Balance         AM-PAC PT "6 Clicks" Mobility  Outcome Measure Help needed turning from your back to your side while in a flat bed without using bedrails?: Total Help needed moving from lying on your back to sitting on the side of a flat bed without using bedrails?: Total Help needed moving to and from a bed to a chair (including a wheelchair)?: Total Help needed  standing up from a chair using your arms (e.g., wheelchair or bedside chair)?: Total Help needed to walk in hospital room?: Total Help needed climbing 3-5 steps with a railing? : Total 6 Click Score: 6    End of Session   Activity Tolerance: Patient limited by lethargy Patient left: in bed;with call bell/phone within reach;with bed alarm set;with nursing/sitter in room Nurse Communication: Mobility status PT Visit Diagnosis: Muscle weakness (generalized) (M62.81);Difficulty in walking, not elsewhere classified (R26.2);Hemiplegia and hemiparesis Hemiplegia - Right/Left: Right Hemiplegia - dominant/non-dominant: Dominant Hemiplegia - caused by: Cerebral infarction    Time: 1035-1055 PT Time Calculation (min) (ACUTE ONLY): 20 min   Charges:   PT Evaluation $PT Eval Moderate Complexity: [place]  Pager [phone] Office [place] 12/10/2018, 12:12 PM

## 2018-12-10 NOTE — Progress Notes (Signed)
STROKE TEAM PROGRESS NOTE   INTERVAL HISTORY  Sitter and nurse at bedside. Pt lying in bed, sleeping and has not woken up possibly due to meds to treat sedation, has been given several dose of haldol and ativan. Global aphasia. Moving all extremities but weaker on the right. CT showed progression of left MCA infarct comparing with 11/12/18. MRI still pending as of this morning. Tachycardia with agitation but no afib on tele. last haldol 9p ativan at 6pm nothing today but still very lethargic.  Vitals:   12/10/18 0455 12/10/18 0759 12/10/18 0800 12/10/18 1258  BP:  (!) 141/58 (!) 141/58 133/69  Pulse: (!) 102 (!) 106 98 90  Resp: 18 (!) 24 (!) 22 (!) 21  Temp:  99 F (37.2 C)  100.2 F (37.9 C)  TempSrc:  Axillary  Axillary  SpO2: 100% 99% 98% 99%  Weight:      Height:        CBC:  Recent Labs  Lab 12/08/18 1827 12/08/18 1855 12/10/18 0627  WBC 7.8  --  10.1  NEUTROABS 6.2  --  8.0*  HGB 9.4* 10.2* 8.9*  HCT 30.5* 30.0* 29.1*  MCV 96.8  --  96.0  PLT 236  --  [id number]    Basic Metabolic Panel:  Recent Labs  Lab 12/09/18 0335 12/10/18 0627  NA 142 142  K 4.2 4.1  CL 112* 110  CO2 20* 23  GLUCOSE 146* 226*  BUN 23 24*  CREATININE 1.80* 1.71*  CALCIUM 8.8* 9.0  MG  --  1.4*  PHOS  --  2.6   Lipid Panel:     Component Value Date/Time   CHOL 92 12/09/2018 0335   TRIG 45 12/09/2018 0335   HDL 45 12/09/2018 0335   CHOLHDL 2.0 12/09/2018 0335   VLDL 9 12/09/2018 0335   LDLCALC 38 12/09/2018 0335   HgbA1c:  Lab Results  Component Value Date   HGBA1C 7.0 (H) 12/09/2018   Urine Drug Screen: No results found for: LABOPIA, COCAINSCRNUR, LABBENZ, AMPHETMU, THCU, LABBARB  Alcohol Level No results found for: ETH  IMAGING  Ct Angio Head W Or Wo Contrast Ct Angio Neck W Or Wo Contrast 12/08/2018 IMPRESSION:  1. No emergent large vessel occlusion.  2. Unchanged severe long segment stenosis of the left internal carotid artery with areas of complete loss of enhancement at  the skull base.  3. Unchanged occlusion of the right vertebral artery V4 segment and severe stenosis of the left V4 segment.  4. Unchanged narrowed left middle cerebral artery without occlusion.  5.  Aortic atherosclerosis (ICD10-I70.0).   Mr Brain Wo Contrast 12/10/2018 IMPRESSION:  Acute infarction in the left parietal cortical and subcortical brain adjacent to an area of old infarction. Old watershed stroke in the left hemisphere. Chronic small vessel insults as described above.   Ct Head Code Stroke Wo Contrast 12/08/2018 IMPRESSION:  1. Acute or subacute extension of infarction in the left parietal lobe posterior to the region previously affected. No evidence of hemorrhage or mass effect.  2. ASPECTS is difficult to apply in the setting of the extensive old infarctions, but the acute changes affect 1 MCA sector.   PHYSICAL EXAM  Temp:  [98.6 F (37 C)-100.2 F (37.9 C)] 100.2 F (37.9 C) (08/29 1258) Pulse Rate:  [84-116] 90 (08/29 1258) Resp:  [16-24] 21 (08/29 1258) BP: (122-177)/(58-94) 133/69 (08/29 1258) SpO2:  [98 %-100 %] 99 % (08/29 1258) Weight:  [76 kg] 76 kg (08/29 0333)  General -  Well nourished, well developed, agitated and restless when awake.  Ophthalmologic - fundi not visualized due to noncooperation.  Cardiovascular - Regular rate and rhythm.   Neuro - sleeping, difficult to arouse, eyes closed, blinks on the left with forced eye opening, eyes not open on command. He had global aphasia, not following commands and nonverbal. PERRL, eyes midposition, not tracking, doll's eye sluggish, positive corneal and gag and cough. Right nasolabial flattening. Not cooperative on tongue protrusion. Spontaneous moving all extremities, but left more than right upper and lower extremities. B/l positive babinski. Sensation, coordination and gait not tested.   ASSESSMENT/PLAN Peter Nicholson is a 78 y.o. male with history of L MCA CVA 6 wks ago, hypertension,  insulin-dependent diabetes mellitus, chronic kidney disease stage III, dementia, depression and anxiety presenting with sudden onset aphasia.   Stroke:   L MCA infarct extension secondary to large vessel disease source  Resultant global aphasia and right mild weakness  Code Stroke CT head acute/subacte extension L parietal lobe infarct.   CTA head & neck no ELVO. Unchanged L ICA segmental occlusion. Unchanged R VA V4 and L V4 occlusions. Unchanged narrow L MCA without occlusion. Aortic atherosclerosis.   MRI - Acute infarction in the left parietal cortical and subcortical brain adjacent to an area of old infarction. Old watershed stroke in the left hemisphere.  2D Echo - pending  LDL 38  HgbA1c 7.0  Heparin 5000 units sq tid for VTE prophylaxis  aspirin 81 mg daily and clopidogrel 75 mg daily prior to admission, now on aspirin 325 mg and plavix 75mg  daily. Continue DAPT  Therapy recommendations:  SNF recommended  Disposition:  pending   Carotid occulsion and intracranial vasculopathy  Left chronic ICA re-occlusion s/p CEA in the past  B/l V4 stenosis, left MCA stenosis  On DAPT  Avoid low BP  BP goal 130-150 given carotid occlusion  History Stroke   07/1999 - BA occlusion, L ICA stenosis, asx, s/p L CEA  2002 VB TIA, angioplasty of lesion  02/2011 L brain TIA off Plavix in prep for leg surgery  02/2014 L MCA/ACA, ACA/PCA watershed infarcts thromboembolic secondary to proximal L ICA occlusion with resultantmoderate expressive aphasia. D/c on dual antiplatelets.  03/2014 multiple left frontal, parietal, temporal infarcts due to occluded left ICA.  MRI also showed right to be occluded.  Carotid Doppler left ICA occlusion, right ICA unremarkable.  EF within normal range.  LDL 113  08/2018 -  left MCA infarct and right punctate cerebellar infarct - known left ICA occlusion, bilateral V4 severe stenosis or occlusion/multivessel atherosclerosis and stenosis.   EEG negative.   A1c 7.1. discharged on DAPT Lipitor 80  Hx Seizure d/o  GTCS x 30 sec in ED  Loaded w/ keppra 1500->500 bid  EEG - pending    Continue keppra   Follows with Dr. Delice Lesch at [facility], follow up outpatient with Dr. Delice Lesch for seizure and stroke  Hypertension  Stable . Permissive hypertension (OK if < 220/120) but gradually towards goal in 3-5 days . Long-term BP goal 130-150  Hyperlipidemia  Home meds:  Lipitor 80  Resume lipitor 80  LDL 38, goal < 70  Continue statin at discharge  Diabetes type II Controlled  HgbA1c 7.0, goal < 7.0  CBGs  SSI  Close PCP follow up  Dysphagia . Secondary to stroke . NPO . Speech on board . S/p cortrak today, on TF . Resume home meds   Other Stroke Risk Factors  Advanced age  Obstructive sleep apnea  PVD  Other Active Problems  AKI on CKD III, Cre 2.04->1.90->1.80->1.71  Mg - 1.4  Depression/anxiety  Dementia with in-hospital delirium - received multiple doses of haldol and ativan  Anemia - 9.4->10.2->8.9  Fever - 100.2 (WBC - 10.1)  PLAN  Await echo and EEG (If unremarkable, stroke team will sign off)  follow up outpatient with Dr. Delice Lesch for seizure and stroke   Hospital day # 1 Personally examined patient and images, and have participated in and made any corrections needed to history, physical, neuro exam,assessment and plan as stated above.  I have personally obtained the history, evaluated lab date, reviewed imaging studies and agree with radiology interpretations.    Sarina Ill, MD Stroke Neurology   A total of 25 minutes was spent for the care of this patient, spent on counseling patient and family on different diagnostic and therapeutic options, counseling and coordination of care, riskd ans benefits of management, compliance, or risk factor reduction and education.  To contact Stroke Continuity provider, please refer to [email or web address] After hours, contact General Neurology

## 2018-12-10 NOTE — Progress Notes (Signed)
SLP Cancellation Note  Patient Details Name: Peter Nicholson MRN: [id number] DOB: Dec 23, 1940   Cancelled treatment:       Reason Eval/Treat Not Completed: Patient's level of consciousness. Patient unable to be aroused, sleeping comfortably. RN stated that MD was recently in patient room and he did not awaken to sternal rub. Most recent sedating med was haldol at approximately 2200 on 8/28.   Patient has Cortrak for nutrition, recommend continue NPO, meds via alternative means (Cortrak, IV).   SLP will continue to follow for patient readiness.   Sonia Baller, MA, CCC-SLP Speech Therapy [facility] Acute Rehab

## 2018-12-10 NOTE — Progress Notes (Signed)
Pt sleeping at this time  Removed restraints  Sitter present at bedside  Will continue to monitor

## 2018-12-10 NOTE — Evaluation (Signed)
Occupational Therapy Evaluation Patient Details Name: Peter Nicholson MRN: [id number] DOB: January 25, 1941 Today's Date: 12/10/2018    History of Present Illness Pt is a 78 yo male w/ Global aphasia. Moving all extremities but weaker on the right. CT showed progression of left MCA infarct comparing with 11/12/18. In ER, pt with 2 witnessed seizures. PMHx: multiple CVAs, HTN, HLD, T2DM.   Clinical Impression   Pt PTA: pt with similar symptoms x6 weeks ago; chart states pt came from home with spouse and pt unable to ambulate which was different from baseline  Pt limited by R side weakness and decreased level of arousal and inability to keep eyes open more than a few seconds at a time. Pt currently performing ADL tasks with totalA. Pt totalA +2 for bed mobility. TotalA for ADL and not initiating movements. Pt performing movement with LUE and light hand grasp, but no active movement noted in RUE. Per staff, pt may have had reflexive movement in LUE. Pt would benefit from continued OT skilled services for ADL, mobility and safety. OT following acutely.    Follow Up Recommendations  SNF;Supervision/Assistance - 24 hour    Equipment Recommendations  Other (comment)(to be determined when more alert)    Recommendations for Other Services       Precautions / Restrictions Precautions Precautions: Fall;Other (comment) Precaution Comments: seizure; mitts on hands; cortrak Restrictions Weight Bearing Restrictions: No      Mobility Bed Mobility Overal bed mobility: Needs Assistance Bed Mobility: Rolling;Supine to Sit;Sit to Supine Rolling: Total assist;+2 for physical assistance   Supine to sit: Total assist;+2 for physical assistance Sit to supine: Total assist;+2 for physical assistance   General bed mobility comments: totalA for mobility and pt did not awaken with movement  Transfers                 General transfer comment: deferred    Balance Overall balance assessment: Needs  assistance Sitting-balance support: Single extremity supported;Feet supported Sitting balance-Leahy Scale: Poor Sitting balance - Comments: modA overall decreasing to minguardA at EOB Postural control: Posterior lean;Right lateral lean                                 ADL either performed or assessed with clinical judgement   ADL Overall ADL's : Needs assistance/impaired Eating/Feeding: NPO Eating/Feeding Details (indicate cue type and reason): feeding tube- cortrak Grooming: Total assistance Grooming Details (indicate cue type and reason): unable to intiate washing face with set-upA Upper Body Bathing: Total assistance   Lower Body Bathing: Total assistance   Upper Body Dressing : Total assistance   Lower Body Dressing: Total assistance   Toilet Transfer: Total assistance   Toileting- Clothing Manipulation and Hygiene: Total assistance       Functional mobility during ADLs: Total assistance General ADL Comments: TotalA for ADl and for bed mobility rolling side to side and sitting EOB;     Vision Patient Visual Report: Other (comment)(unknown) Vision Assessment?: Vision impaired- to be further tested in functional context Additional Comments: unable to assess     Perception     Praxis      Pertinent Vitals/Pain Pain Assessment: Faces Faces Pain Scale: No hurt     Hand Dominance Right   Extremity/Trunk Assessment Upper Extremity Assessment Upper Extremity Assessment: Generalized weakness;RUE deficits/detail;LUE deficits/detail RUE Deficits / Details: 0/5 MM grade noted; increased tone in elbow (IV site) RUE Coordination: decreased fine motor;decreased gross motor  LUE Deficits / Details: weakness 3-/5 MM grade; grip weak 2+/5 MM grade LUE Coordination: decreased fine motor;decreased gross motor   Lower Extremity Assessment Lower Extremity Assessment: Defer to PT evaluation;Generalized weakness       Communication Communication Communication:  Other (comment)(decreased level of arousal)   Cognition Arousal/Alertness: Lethargic;Suspect due to medications Behavior During Therapy: Flat affect Overall Cognitive Status: Difficult to assess                                 General Comments: Pt decreased level of arousal, not alerting to sternal rub or sitting up at EOB for more than a few seconds.    General Comments       Exercises     Shoulder Instructions      Home Living Family/patient expects to be discharged to:: Private residence                                 Additional Comments: unable to obtain from pt due to decreased level of arousal.      Prior Functioning/Environment          Comments: unknown        OT Problem List: Decreased strength;Decreased activity tolerance;Impaired balance (sitting and/or standing);Decreased coordination;Decreased safety awareness      OT Treatment/Interventions: Self-care/ADL training;Therapeutic exercise;Neuromuscular education;Energy conservation;Therapeutic activities;Patient/family education;Balance training    OT Goals(Current goals can be found in the care plan section) Acute Rehab OT Goals Patient Stated Goal: unable to state OT Goal Formulation: Patient unable to participate in goal setting Time For Goal Achievement: 12/24/18 Potential to Achieve Goals: Good ADL Goals Pt Will Perform Eating: with set-up;sitting Pt Will Perform Grooming: with min assist;sitting Pt Will Perform Upper Body Dressing: with min assist;sitting Pt Will Perform Lower Body Dressing: with mod assist;sitting/lateral leans Pt Will Transfer to Toilet: with min assist;stand pivot transfer;with +2 assist;bedside commode Pt Will Perform Toileting - Clothing Manipulation and hygiene: with min assist;sitting/lateral leans;sit to/from stand Additional ADL Goal #1: Pt will attend to ADL task x5 mins with 1-2 verbal cues to increase level of arousal and stay on task.  OT  Frequency: Min 2X/week   Barriers to D/C:            Co-evaluation              AM-PAC OT "6 Clicks" Daily Activity     Outcome Measure Help from another person eating meals?: Total Help from another person taking care of personal grooming?: Total Help from another person toileting, which includes using toliet, bedpan, or urinal?: Total Help from another person bathing (including washing, rinsing, drying)?: Total Help from another person to put on and taking off regular upper body clothing?: Total Help from another person to put on and taking off regular lower body clothing?: Total 6 Click Score: 6   End of Session Nurse Communication: Mobility status  Activity Tolerance: Patient limited by lethargy;Treatment limited secondary to medical complications (Comment) Patient left: in bed;with call bell/phone within reach;with bed alarm set;with nursing/sitter in room  OT Visit Diagnosis: Unsteadiness on feet (R26.81);Muscle weakness (generalized) (M62.81)                Time: 1030-1053 OT Time Calculation (min): 23 min Charges:  OT General Charges $OT Visit: 1 Visit OT Evaluation $OT Eval Moderate Complexity: 1 Mod  Aarvi Stotts (Jelenek) Marsa Aris OTR/L Acute  Rehabilitation Services Pager: [phone] Office: [place] 12/10/2018, 11:19 AM

## 2018-12-10 NOTE — Progress Notes (Signed)
EEG complete - results pending 

## 2018-12-10 NOTE — Procedures (Signed)
Patient Name: Peter Nicholson  MRN: [id number]  Epilepsy Attending: Lora Havens  Referring Physician/Provider: Dr Rosalin Hawking Date: 12/10/2018  Duration: 27.09 mins  Patient history: 78yo M with left side stroke. EEG to evaluate for seizure  Level of alertness: lethargic/obtunded  AEDs during EEG study: keppra  Technical aspects: This EEG study was done with scalp electrodes positioned according to the 10-20 International system of electrode placement. Electrical activity was acquired at a sampling rate of 500Hz  and reviewed with a high frequency filter of 70Hz  and a low frequency filter of 1Hz . EEG data were recorded continuously and digitally stored.   DESCRIPTION: EEG showed continuous generalized 3-5hz  theta-delta slowing. EEG was reactive to stimulation. No clear posterior dominant rhythm was seen. Hyperventilation and photic stimulation were not performed.  IMPRESSION: This study is suggestive of severe diffuse encephalopathy. No seizures or epileptiform discharges were seen throughout the recording.  Michele Judy Barbra Sarks

## 2018-12-10 NOTE — Progress Notes (Signed)
*  PRELIMINARY RESULTS* Echocardiogram 2D Echocardiogram has been performed.  Leavy Cella 12/10/2018, 3:39 PM

## 2018-12-10 NOTE — Progress Notes (Signed)
Pt lethargic and unable to follow commands at this time MD aware  Will continue to monitor

## 2018-12-10 NOTE — Progress Notes (Signed)
PROGRESS NOTE    Peter Nicholson  [id number]  DOB: 10-13-1940  DOA: 12/08/2018 PCP: No primary care provider on file.  Brief Narrative:   78 year old male with history of hypertension, insulin-dependent type 2 diabetes, CKD stage III, dementia, depression anxiety and recently stroke on 5/20 presented to the emergency department for evaluation of worsening right-sided weakness, difficult to talk.  Has history of dementia.  Wife noted acute onset aphasia and increasing difficulty with walking.  No seizures at home.  Upon arrival to the emergency room, patient was afebrile, on room air.  Slight renal insufficiency.  Stable normocytic anemia.  CT head showed acute or subacute extension of infarction of left parietal lobe.  No emergent large vessel occlusion.  While in the emergency room, he had 2 witnessed generalized tonic-clonic seizure.  Remains encephalopathic since then.  Subjective:  Received haldol 2mg  and ativan last night, none today. Somnolen but easily arousable to verbal stimuli when see this evening. Nurse feels comfortable watching him off sitter.  Objective: Vitals:   12/10/18 0800 12/10/18 1258 12/10/18 1458 12/10/18 1622  BP: (!) 141/58 133/69 (!) 118/54 (!) 107/48  Pulse: 98 90 97 (!) 105  Resp: (!) 22 (!) 21 (!) 23 19  Temp:  100.2 F (37.9 C)  99.5 F (37.5 C)  TempSrc:  Axillary  Axillary  SpO2: 98% 99% 98% 99%  Weight:      Height:        Intake/Output Summary (Last 24 hours) at 12/10/2018 2005 Last data filed at 12/10/2018 1703 Gross per 24 hour  Intake 1376.89 ml  Output 1100 ml  Net 276.89 ml   Filed Weights   12/08/18 2030 12/10/18 0333  Weight: 75.3 kg 76 kg    Physical Examination:  General exam: Appears drowsy, easily arousable and moans. NG tube in place Respiratory system: Clear to auscultation. Respiratory effort normal. Cardiovascular system: S1 & S2 heard, RRR. No pedal edema. Gastrointestinal system: Abdomen is nondistended, soft and  nontender. No organomegaly or masses felt. Normal bowel sounds heard. Central nervous system: Somnolent/drowsy, disoriented, right hemiparesis Extremities: Symmetric 5 x 5 power. Skin: No rashes, lesions or ulcers Psychiatry: Judgement and insight impaired   Data Reviewed: I have personally reviewed following labs and imaging studies  CBC: Recent Labs  Lab 12/08/18 1827 12/08/18 1855 12/10/18 0627  WBC 7.8  --  10.1  NEUTROABS 6.2  --  8.0*  HGB 9.4* 10.2* 8.9*  HCT 30.5* 30.0* 29.1*  MCV 96.8  --  96.0  PLT 236  --  [id number]   Basic Metabolic Panel: Recent Labs  Lab 12/08/18 1827 12/08/18 1855 12/09/18 0335 12/10/18 0627  NA 139 142 142 142  K 4.4 4.5 4.2 4.1  CL 108 108 112* 110  CO2 22  --  20* 23  GLUCOSE 277* 258* 146* 226*  BUN 29* 29* 23 24*  CREATININE 2.04* 1.90* 1.80* 1.71*  CALCIUM 9.0  --  8.8* 9.0  MG  --   --   --  1.4*  PHOS  --   --   --  2.6   GFR: Estimated Creatinine Clearance: 31.6 mL/min (A) (by C-G formula based on SCr of 1.71 mg/dL (H)). Liver Function Tests: Recent Labs  Lab 12/08/18 1827 12/09/18 0335  AST 19 25  ALT 19 19  ALKPHOS 86 82  BILITOT 0.5 0.3  PROT 6.3* 5.8*  ALBUMIN 3.4* 3.2*   No results for input(s): LIPASE, AMYLASE in the last 168 hours. No results for  input(s): AMMONIA in the last 168 hours. Coagulation Profile: Recent Labs  Lab 12/08/18 1827  INR 1.0   Cardiac Enzymes: No results for input(s): CKTOTAL, CKMB, CKMBINDEX, TROPONINI in the last 168 hours. BNP (last 3 results) No results for input(s): PROBNP in the last 8760 hours. HbA1C: Recent Labs    12/09/18 0335  HGBA1C 7.0*   CBG: Recent Labs  Lab 12/10/18 0335 12/10/18 0803 12/10/18 1239 12/10/18 1617 12/10/18 1941  GLUCAP 176* 236* 171* 250* 250*   Lipid Profile: Recent Labs    12/09/18 0335  CHOL 92  HDL 45  LDLCALC 38  TRIG 45  CHOLHDL 2.0   Thyroid Function Tests: No results for input(s): TSH, T4TOTAL, FREET4, T3FREE, THYROIDAB in  the last 72 hours. Anemia Panel: No results for input(s): VITAMINB12, FOLATE, FERRITIN, TIBC, IRON, RETICCTPCT in the last 72 hours. Sepsis Labs: No results for input(s): PROCALCITON, LATICACIDVEN in the last 168 hours.  Recent Results (from the past 240 hour(s))  SARS Coronavirus 2 [facility] order, Performed in [facility] hospital lab) Nasopharyngeal Nasopharyngeal Swab     Status: None   Collection Time: 12/08/18  8:45 PM   Specimen: Nasopharyngeal Swab  Result Value Ref Range Status   SARS Coronavirus 2 NEGATIVE NEGATIVE Final    Comment: (NOTE) If result is NEGATIVE SARS-CoV-2 target nucleic acids are NOT DETECTED. The SARS-CoV-2 RNA is generally detectable in upper and lower  respiratory specimens during the acute phase of infection. The lowest  concentration of SARS-CoV-2 viral copies this assay can detect is 250  copies / mL. A negative result does not preclude SARS-CoV-2 infection  and should not be used as the sole basis for treatment or other  patient management decisions.  A negative result may occur with  improper specimen collection / handling, submission of specimen other  than nasopharyngeal swab, presence of viral mutation(s) within the  areas targeted by this assay, and inadequate number of viral copies  (<250 copies / mL). A negative result must be combined with clinical  observations, patient history, and epidemiological information. If result is POSITIVE SARS-CoV-2 target nucleic acids are DETECTED. The SARS-CoV-2 RNA is generally detectable in upper and lower  respiratory specimens dur ing the acute phase of infection.  Positive  results are indicative of active infection with SARS-CoV-2.  Clinical  correlation with patient history and other diagnostic information is  necessary to determine patient infection status.  Positive results do  not rule out bacterial infection or co-infection with other viruses. If result is PRESUMPTIVE POSTIVE SARS-CoV-2 nucleic  acids MAY BE PRESENT.   A presumptive positive result was obtained on the submitted specimen  and confirmed on repeat testing.  While 2019 novel coronavirus  (SARS-CoV-2) nucleic acids may be present in the submitted sample  additional confirmatory testing may be necessary for epidemiological  and / or clinical management purposes  to differentiate between  SARS-CoV-2 and other Sarbecovirus currently known to infect humans.  If clinically indicated additional testing with an alternate test  methodology [phone]) is advised. The SARS-CoV-2 RNA is generally  detectable in upper and lower respiratory sp ecimens during the acute  phase of infection. The expected result is Negative. Fact Sheet for Patients:  StrictlyIdeas.no Fact Sheet for Healthcare Providers: BankingDealers.co.za This test is not yet approved or cleared by the [place] FDA and has been authorized for detection and/or diagnosis of SARS-CoV-2 by FDA under an Emergency Use Authorization (EUA).  This EUA will remain in effect (meaning this test can  be used) for the duration of the COVID-19 declaration under Section 564(b)(1) of the Act, 21 U.S.C. section 360bbb-3(b)(1), unless the authorization is terminated or revoked sooner. Performed at [facility], [place] [address] [place], [place] [address]       Radiology Studies: Ct Angio Head W Or Wo Contrast  Result Date: 12/08/2018 CLINICAL DATA:  Right-sided weakness and aphasia EXAM: CT ANGIOGRAPHY HEAD AND NECK TECHNIQUE: Multidetector CT imaging of the head and neck was performed using the standard protocol during bolus administration of intravenous contrast. Multiplanar CT image reconstructions and MIPs were obtained to evaluate the vascular anatomy. Carotid stenosis measurements (when applicable) are obtained utilizing NASCET criteria, using the distal internal carotid diameter as the denominator. CONTRAST:  31mL  OMNIPAQUE IOHEXOL 350 MG/ML SOLN COMPARISON:  CTA head neck 09/02/2018 FINDINGS: CTA NECK FINDINGS SKELETON: There is no bony spinal canal stenosis. No lytic or blastic lesion. OTHER NECK: Normal pharynx, larynx and major salivary glands. No cervical lymphadenopathy. Unremarkable thyroid gland. UPPER CHEST: No pneumothorax or pleural effusion. No nodules or masses. AORTIC ARCH: There is mild calcific atherosclerosis of the aortic arch. There is no aneurysm, dissection or hemodynamically significant stenosis of the visualized ascending aorta and aortic arch. Conventional 3 vessel aortic branching pattern. The visualized proximal subclavian arteries are widely patent. RIGHT CAROTID SYSTEM: --Common carotid artery: Widely patent origin without common carotid artery dissection or aneurysm. --Internal carotid artery: No dissection, occlusion or aneurysm. Mild atherosclerotic calcification at the carotid bifurcation without hemodynamically significant stenosis. --External carotid artery: No acute abnormality. LEFT CAROTID SYSTEM: --Common carotid artery: Widely patent origin without common carotid artery dissection or aneurysm. Mixed calcified and non-calcified atherosclerotic disease at the bifurcation, extending into the internal carotid artery, resulting in less than 50% stenosis. --Internal carotid artery: There is severe long segment narrowing the left internal carotid artery with an angiographic string sign. However, this is unchanged compared to 09/02/2018. The ICA is patent to the skull base. There is near total loss of enhancement at the petrous segment. --External carotid artery: No acute abnormality. VERTEBRAL ARTERIES: Left dominant configuration. Both origins are clearly patent. The right vertebral artery is occluded at the V3 V4 junction, unchanged. CTA HEAD FINDINGS POSTERIOR CIRCULATION: --Vertebral arteries: The right V4 segment is occluded. There is severe stenosis of the left V4 segment secondary to  atherosclerotic calcification. --Posterior inferior cerebellar arteries (PICA): Both are patent --Anterior inferior cerebellar arteries (AICA): Patent origins from the basilar artery. --Basilar artery: Normal. --Superior cerebellar arteries: Normal. --Posterior cerebral arteries: The right PCA is diminutive, unchanged. Left PCA is normal. ANTERIOR CIRCULATION: --Intracranial internal carotid arteries: The left internal carotid artery is occluded over a short segment at the distal petrous and cavernous segments. The carotid terminus is patent but severely narrowed, unchanged. --Anterior cerebral arteries (ACA): Non opacification of the left A1 segment, unchanged. Both distal anterior cerebral arteries are patent. The anterior communicating arteries patent. --Middle cerebral arteries (MCA): The right MCA is normal. Unchanged attenuated appearance of the left MCA without occlusion. VENOUS SINUSES: As permitted by contrast timing, patent. ANATOMIC VARIANTS: None Review of the MIP images confirms the above findings. IMPRESSION: 1. No emergent large vessel occlusion. 2. Unchanged severe long segment stenosis of the left internal carotid artery with areas of complete loss of enhancement at the skull base. 3. Unchanged occlusion of the right vertebral artery V4 segment and severe stenosis of the left V4 segment. 4. Unchanged narrowed left middle cerebral artery without occlusion. 5.  Aortic atherosclerosis (ICD10-I70.0). 6.  These results were called by telephone at the time of interpretation on 12/08/2018 at 7:35 pm to Dr. Kerney Elbe , who verbally acknowledged these results. Electronically Signed   By: Ulyses Jarred [name]   On: 12/08/2018 19:51   Ct Angio Neck W Or Wo Contrast  Result Date: 12/08/2018 CLINICAL DATA:  Right-sided weakness and aphasia EXAM: CT ANGIOGRAPHY HEAD AND NECK TECHNIQUE: Multidetector CT imaging of the head and neck was performed using the standard protocol during bolus administration of  intravenous contrast. Multiplanar CT image reconstructions and MIPs were obtained to evaluate the vascular anatomy. Carotid stenosis measurements (when applicable) are obtained utilizing NASCET criteria, using the distal internal carotid diameter as the denominator. CONTRAST:  91mL OMNIPAQUE IOHEXOL 350 MG/ML SOLN COMPARISON:  CTA head neck 09/02/2018 FINDINGS: CTA NECK FINDINGS SKELETON: There is no bony spinal canal stenosis. No lytic or blastic lesion. OTHER NECK: Normal pharynx, larynx and major salivary glands. No cervical lymphadenopathy. Unremarkable thyroid gland. UPPER CHEST: No pneumothorax or pleural effusion. No nodules or masses. AORTIC ARCH: There is mild calcific atherosclerosis of the aortic arch. There is no aneurysm, dissection or hemodynamically significant stenosis of the visualized ascending aorta and aortic arch. Conventional 3 vessel aortic branching pattern. The visualized proximal subclavian arteries are widely patent. RIGHT CAROTID SYSTEM: --Common carotid artery: Widely patent origin without common carotid artery dissection or aneurysm. --Internal carotid artery: No dissection, occlusion or aneurysm. Mild atherosclerotic calcification at the carotid bifurcation without hemodynamically significant stenosis. --External carotid artery: No acute abnormality. LEFT CAROTID SYSTEM: --Common carotid artery: Widely patent origin without common carotid artery dissection or aneurysm. Mixed calcified and non-calcified atherosclerotic disease at the bifurcation, extending into the internal carotid artery, resulting in less than 50% stenosis. --Internal carotid artery: There is severe long segment narrowing the left internal carotid artery with an angiographic string sign. However, this is unchanged compared to 09/02/2018. The ICA is patent to the skull base. There is near total loss of enhancement at the petrous segment. --External carotid artery: No acute abnormality. VERTEBRAL ARTERIES: Left dominant  configuration. Both origins are clearly patent. The right vertebral artery is occluded at the V3 V4 junction, unchanged. CTA HEAD FINDINGS POSTERIOR CIRCULATION: --Vertebral arteries: The right V4 segment is occluded. There is severe stenosis of the left V4 segment secondary to atherosclerotic calcification. --Posterior inferior cerebellar arteries (PICA): Both are patent --Anterior inferior cerebellar arteries (AICA): Patent origins from the basilar artery. --Basilar artery: Normal. --Superior cerebellar arteries: Normal. --Posterior cerebral arteries: The right PCA is diminutive, unchanged. Left PCA is normal. ANTERIOR CIRCULATION: --Intracranial internal carotid arteries: The left internal carotid artery is occluded over a short segment at the distal petrous and cavernous segments. The carotid terminus is patent but severely narrowed, unchanged. --Anterior cerebral arteries (ACA): Non opacification of the left A1 segment, unchanged. Both distal anterior cerebral arteries are patent. The anterior communicating arteries patent. --Middle cerebral arteries (MCA): The right MCA is normal. Unchanged attenuated appearance of the left MCA without occlusion. VENOUS SINUSES: As permitted by contrast timing, patent. ANATOMIC VARIANTS: None Review of the MIP images confirms the above findings. IMPRESSION: 1. No emergent large vessel occlusion. 2. Unchanged severe long segment stenosis of the left internal carotid artery with areas of complete loss of enhancement at the skull base. 3. Unchanged occlusion of the right vertebral artery V4 segment and severe stenosis of the left V4 segment. 4. Unchanged narrowed left middle cerebral artery without occlusion. 5.  Aortic atherosclerosis (ICD10-I70.0). 6. These results were called by telephone  at the time of interpretation on 12/08/2018 at 7:35 pm to Dr. Kerney Elbe , who verbally acknowledged these results. Electronically Signed   By: Ulyses Jarred [name]   On: 12/08/2018 19:51    Mr Brain Wo Contrast  Result Date: 12/10/2018 CLINICAL DATA:  Follow-up stroke.  Right-sided weakness and aphasia. EXAM: MRI HEAD WITHOUT CONTRAST TECHNIQUE: Multiplanar, multiecho pulse sequences of the brain and surrounding structures were obtained without intravenous contrast. COMPARISON:  CT studies 12/08/2018 FINDINGS: Brain: Today's exam suffers from considerable motion degradation. No acute infarction is seen affecting the brainstem, cerebellum or right cerebral hemisphere. MRI confirms the acute extension of infarction in the right parietal lobe adjacent to an old cortical and subcortical infarction. No blood products are detected. No mass effect. Old small vessel infarctions are seen throughout the cerebellum. Old watershed distribution infarction in the left hemisphere affecting the cortical and subcortical brain in the deep white matter. No evidence of mass, hydrocephalus or extra-axial collection. Vascular: Major vessels at the base of the brain show flow. Skull and upper cervical spine: Negative Sinuses/Orbits: Clear/normal Other: None IMPRESSION: Acute infarction in the left parietal cortical and subcortical brain adjacent to an area of old infarction. Old watershed stroke in the left hemisphere. Chronic small vessel insults as described above. Electronically Signed   By: Nelson Chimes [name]   On: 12/10/2018 12:28        Scheduled Meds:  [START ON 12/11/2018] aspirin  324 mg Per Tube Daily   atorvastatin  80 mg Per Tube q1800   [START ON 12/11/2018] clopidogrel  75 mg Per Tube Daily   feeding supplement (PRO-STAT SUGAR FREE 64)  30 mL Per Tube Daily   heparin  5,000 Units Subcutaneous Q8H   insulin aspart  0-9 Units Subcutaneous Q4H   insulin glargine  7 Units Subcutaneous QHS   Continuous Infusions:  feeding supplement (OSMOLITE 1.5 CAL) 1,000 mL (12/10/18 1648)   levETIRAcetam 500 mg (12/10/18 0900)    Assessment & Plan:   1. Acute ischemic stroke with right-sided  weakness and aphasia: CT head on admission showed acute/subacute extension of prior left MCA stroke. MRI of the brain, unable to do due to severe agitation and as may not change outcome. Can consider when possible.CTA head and neck: Chronic bilateral occlusion with severe atherosclerotic disease unchanged from before. 2D echocardiogram 8/29 showed left ventricle hyperdynamic systolic function, with an ejection fraction of >65% On dual antiplatelet therapy, aspirin and Plavix at home. Resume via NGT. LDL 38, already on a statin. Hemoglobin A1c, 7.  At goal on insulin. Appreciate neurology input.  Continue to work with the speech, PT OT.  Anticipate prolonged recovery.  2. Generalized tonic-clonic seizure: Probably with lower seizure threshold with cortical stroke.  2 witnessed tonic-clonic seizure.  Loaded with Keppra.  Currently remains on Keppra 500 mg twice a day.  EEG 8/29 suggestive of severe diffuse encephalopathy. No seizures or epileptiform discharges were seen throughout the recording  3.Acute metabolic encephalopathy/delirium/agitation:Probably due to stroke and seizure in the setting of underlying dementia.  Patient been restless, agitated, climbing out of the bed. Ativan and Haldol ordered as needed with Soft restraint Banker. Calm today. Underwent Echo/EEG. Avoid oversedation. Aspiration/fall precautions as d/w nurse  4.AKI on CKD stage III: On maintenance IV fluids with improvement.  Continue monitoring.  Check magnesium and phosphorus.  5.Insulin-dependent type 2 diabetes: Well controlled on Lantus 7 units/SSI.  On decreased dose of insulin while n.p.o.  6.Hypertension: Permissive hypertension.  Not  resuming any blood pressure medications.  7. Depression/anxiety with underlying dementia: Poor cognitive reserve.  Anticipate prolonged recovery.  Will resume home medications via NGT when more stable    DVT prophylaxis: Heparin Code Status: Full code Family / Patient  Communication: No family bedside Disposition Plan: TBD     LOS: 1 day    Time spent: 69 minutes    Guilford Shi, MD [facility] Pager [phone]  If 7PM-7AM, please contact night-coverage [email or web address] Password [email or web address] 12/10/2018, 8:05 PM

## 2018-12-11 LAB — GLUCOSE, CAPILLARY
Glucose-Capillary: 151 mg/dL — ABNORMAL HIGH (ref 70–99)
Glucose-Capillary: 201 mg/dL — ABNORMAL HIGH (ref 70–99)
Glucose-Capillary: 281 mg/dL — ABNORMAL HIGH (ref 70–99)
Glucose-Capillary: 298 mg/dL — ABNORMAL HIGH (ref 70–99)
Glucose-Capillary: 304 mg/dL — ABNORMAL HIGH (ref 70–99)
Glucose-Capillary: 323 mg/dL — ABNORMAL HIGH (ref 70–99)

## 2018-12-11 MED ORDER — SODIUM CHLORIDE 0.9 % IV SOLN
INTRAVENOUS | Status: DC
  Administered 2018-12-11 – 2018-12-12 (×2): via INTRAVENOUS

## 2018-12-11 MED ORDER — ASPIRIN 81 MG PO CHEW: 81.0000 mg | CHEWABLE_TABLET | Freq: Every day | ORAL | Status: DC

## 2018-12-11 MED ORDER — CHLORHEXIDINE GLUCONATE 0.12 % MT SOLN
15.0000 mL | Freq: Four times a day (QID) | OROMUCOSAL | Status: DC
  Administered 2018-12-11 – 2018-12-14 (×14): 15 mL via OROMUCOSAL
  Filled 2018-12-11 (×11): qty 15

## 2018-12-11 NOTE — Progress Notes (Addendum)
PROGRESS NOTE    Peter Nicholson  [id number]  DOB: 1940-10-30  DOA: 12/08/2018 PCP: No primary care provider on file.  Brief Narrative:   78 year old male with history of hypertension, insulin-dependent type 2 diabetes, CKD stage III, dementia, depression anxiety and recently stroke on 5/20 presented to the emergency department for evaluation of worsening right-sided weakness, difficult to talk.  Has history of dementia.  Wife noted acute onset aphasia and increasing difficulty with walking.  No seizures at home.  Upon arrival to the emergency room, patient was afebrile, on room air.  Slight renal insufficiency.  Stable normocytic anemia.  CT head showed acute or subacute extension of infarction of left parietal lobe.  No emergent large vessel occlusion.  While in the emergency room, he had 2 witnessed generalized tonic-clonic seizure.  Remains encephalopathic since then.  Subjective:  No acute issues overnight, did not require any additional sedatives. Appears calm during rounds.  Nurse feels comfortable watching him off sitter.  Objective: Vitals:   12/11/18 0847 12/11/18 0945 12/11/18 1101 12/11/18 1246  BP: (!) 140/58 140/75 140/75 133/71  Pulse: 97 (!) 122 92 (!) 101  Resp:  20 (!) 23 (!) 24  Temp: 99.9 F (37.7 C) 99.2 F (37.3 C) 99.1 F (37.3 C) 98.7 F (37.1 C)  TempSrc: Oral Oral Axillary Oral  SpO2:  97% 96% 95%  Weight:      Height:        Intake/Output Summary (Last 24 hours) at 12/11/2018 1539 Last data filed at 12/10/2018 1703 Gross per 24 hour  Intake 60 ml  Output 250 ml  Net -190 ml   Filed Weights   12/08/18 2030 12/10/18 0333 12/11/18 0400  Weight: 75.3 kg 76 kg 78 kg    Physical Examination:  General exam: Appears drowsy, easily arousable and moans. NG tube in place Respiratory system: Clear to auscultation. Respiratory effort normal. Cardiovascular system: S1 & S2 heard, RRR. No pedal edema. Gastrointestinal system: Abdomen is nondistended,  soft and nontender. No organomegaly or masses felt. Normal bowel sounds heard. Central nervous system: Somnolent/drowsy, disoriented, right hemiparesis Extremities: Symmetric 5 x 5 power. Skin: No rashes, lesions or ulcers Psychiatry: Judgement and insight impaired   Data Reviewed: I have personally reviewed following labs and imaging studies  CBC: Recent Labs  Lab 12/08/18 1827 12/08/18 1855 12/10/18 0627  WBC 7.8  --  10.1  NEUTROABS 6.2  --  8.0*  HGB 9.4* 10.2* 8.9*  HCT 30.5* 30.0* 29.1*  MCV 96.8  --  96.0  PLT 236  --  [id number]   Basic Metabolic Panel: Recent Labs  Lab 12/08/18 1827 12/08/18 1855 12/09/18 0335 12/10/18 0627  NA 139 142 142 142  K 4.4 4.5 4.2 4.1  CL 108 108 112* 110  CO2 22  --  20* 23  GLUCOSE 277* 258* 146* 226*  BUN 29* 29* 23 24*  CREATININE 2.04* 1.90* 1.80* 1.71*  CALCIUM 9.0  --  8.8* 9.0  MG  --   --   --  1.4*  PHOS  --   --   --  2.6   GFR: Estimated Creatinine Clearance: 34.4 mL/min (A) (by C-G formula based on SCr of 1.71 mg/dL (H)). Liver Function Tests: Recent Labs  Lab 12/08/18 1827 12/09/18 0335  AST 19 25  ALT 19 19  ALKPHOS 86 82  BILITOT 0.5 0.3  PROT 6.3* 5.8*  ALBUMIN 3.4* 3.2*   No results for input(s): LIPASE, AMYLASE in the last 168 hours.  No results for input(s): AMMONIA in the last 168 hours. Coagulation Profile: Recent Labs  Lab 12/08/18 1827  INR 1.0   Cardiac Enzymes: No results for input(s): CKTOTAL, CKMB, CKMBINDEX, TROPONINI in the last 168 hours. BNP (last 3 results) No results for input(s): PROBNP in the last 8760 hours. HbA1C: Recent Labs    12/09/18 0335  HGBA1C 7.0*   CBG: Recent Labs  Lab 12/10/18 1941 12/10/18 2333 12/11/18 0359 12/11/18 0831 12/11/18 1158  GLUCAP 250* 206* 298* 304* 281*   Lipid Profile: Recent Labs    12/09/18 0335  CHOL 92  HDL 45  LDLCALC 38  TRIG 45  CHOLHDL 2.0   Thyroid Function Tests: No results for input(s): TSH, T4TOTAL, FREET4, T3FREE,  THYROIDAB in the last 72 hours. Anemia Panel: No results for input(s): VITAMINB12, FOLATE, FERRITIN, TIBC, IRON, RETICCTPCT in the last 72 hours. Sepsis Labs: No results for input(s): PROCALCITON, LATICACIDVEN in the last 168 hours.  Recent Results (from the past 240 hour(s))  SARS Coronavirus 2 [facility] order, Performed in [facility] hospital lab) Nasopharyngeal Nasopharyngeal Swab     Status: None   Collection Time: 12/08/18  8:45 PM   Specimen: Nasopharyngeal Swab  Result Value Ref Range Status   SARS Coronavirus 2 NEGATIVE NEGATIVE Final    Comment: (NOTE) If result is NEGATIVE SARS-CoV-2 target nucleic acids are NOT DETECTED. The SARS-CoV-2 RNA is generally detectable in upper and lower  respiratory specimens during the acute phase of infection. The lowest  concentration of SARS-CoV-2 viral copies this assay can detect is 250  copies / mL. A negative result does not preclude SARS-CoV-2 infection  and should not be used as the sole basis for treatment or other  patient management decisions.  A negative result may occur with  improper specimen collection / handling, submission of specimen other  than nasopharyngeal swab, presence of viral mutation(s) within the  areas targeted by this assay, and inadequate number of viral copies  (<250 copies / mL). A negative result must be combined with clinical  observations, patient history, and epidemiological information. If result is POSITIVE SARS-CoV-2 target nucleic acids are DETECTED. The SARS-CoV-2 RNA is generally detectable in upper and lower  respiratory specimens dur ing the acute phase of infection.  Positive  results are indicative of active infection with SARS-CoV-2.  Clinical  correlation with patient history and other diagnostic information is  necessary to determine patient infection status.  Positive results do  not rule out bacterial infection or co-infection with other viruses. If result is PRESUMPTIVE POSTIVE  SARS-CoV-2 nucleic acids MAY BE PRESENT.   A presumptive positive result was obtained on the submitted specimen  and confirmed on repeat testing.  While 2019 novel coronavirus  (SARS-CoV-2) nucleic acids may be present in the submitted sample  additional confirmatory testing may be necessary for epidemiological  and / or clinical management purposes  to differentiate between  SARS-CoV-2 and other Sarbecovirus currently known to infect humans.  If clinically indicated additional testing with an alternate test  methodology [phone]) is advised. The SARS-CoV-2 RNA is generally  detectable in upper and lower respiratory sp ecimens during the acute  phase of infection. The expected result is Negative. Fact Sheet for Patients:  StrictlyIdeas.no Fact Sheet for Healthcare Providers: BankingDealers.co.za This test is not yet approved or cleared by the [place] FDA and has been authorized for detection and/or diagnosis of SARS-CoV-2 by FDA under an Emergency Use Authorization (EUA).  This EUA will remain in effect (meaning  this test can be used) for the duration of the COVID-19 declaration under Section 564(b)(1) of the Act, 21 U.S.C. section 360bbb-3(b)(1), unless the authorization is terminated or revoked sooner. Performed at [facility], [place] [address], [place], [place] [address]       Radiology Studies: Mr Brain Wo Contrast  Result Date: 12/10/2018 CLINICAL DATA:  Follow-up stroke.  Right-sided weakness and aphasia. EXAM: MRI HEAD WITHOUT CONTRAST TECHNIQUE: Multiplanar, multiecho pulse sequences of the brain and surrounding structures were obtained without intravenous contrast. COMPARISON:  CT studies 12/08/2018 FINDINGS: Brain: Today's exam suffers from considerable motion degradation. No acute infarction is seen affecting the brainstem, cerebellum or right cerebral hemisphere. MRI confirms the acute extension of infarction in the  right parietal lobe adjacent to an old cortical and subcortical infarction. No blood products are detected. No mass effect. Old small vessel infarctions are seen throughout the cerebellum. Old watershed distribution infarction in the left hemisphere affecting the cortical and subcortical brain in the deep white matter. No evidence of mass, hydrocephalus or extra-axial collection. Vascular: Major vessels at the base of the brain show flow. Skull and upper cervical spine: Negative Sinuses/Orbits: Clear/normal Other: None IMPRESSION: Acute infarction in the left parietal cortical and subcortical brain adjacent to an area of old infarction. Old watershed stroke in the left hemisphere. Chronic small vessel insults as described above. Electronically Signed   By: Nelson Chimes [name]   On: 12/10/2018 12:28        Scheduled Meds: . [START ON 12/12/2018] aspirin  81 mg Per Tube Daily  . atorvastatin  80 mg Per Tube q1800  . chlorhexidine  15 mL Mouth/Throat QID  . clopidogrel  75 mg Per Tube Daily  . feeding supplement (PRO-STAT SUGAR FREE 64)  30 mL Per Tube Daily  . heparin  5,000 Units Subcutaneous Q8H  . insulin aspart  0-9 Units Subcutaneous Q4H  . insulin glargine  7 Units Subcutaneous QHS   Continuous Infusions: . feeding supplement (OSMOLITE 1.5 CAL) 1,000 mL (12/11/18 1301)  . levETIRAcetam 500 mg (12/11/18 1028)    Assessment & Plan:   1. Acute ischemic stroke with right-sided weakness and aphasia: CT head on admission showed acute/subacute extension of prior left MCA stroke. MRI of the brain,showed acute infarction in the left parietal cortical and subcortical brain adjacent to an area of old infarction. Old watershed stroke in the left hemisphere as well as several chronic small vessel insults. CTA head and neck: Chronic bilateral occlusion with severe atherosclerotic disease unchanged from before.2D echocardiogram 8/29 showed left ventricle hyperdynamic systolic function, with an ejection  fraction of >65% On dual antiplatelet therapy, aspirin and Plavix at home. Resumed now via NGT. LDL 38, already on a statin. Hemoglobin A1c, 7.  At goal on insulin. Appreciate neurology input.  Continue to work with the speech, PT OT.  Meaningful recovery is skeptical at this point. Will consult palliative care to discuss care goals with family.   2. Generalized tonic-clonic seizure: Probably with lower seizure threshold with cortical stroke.  2 witnessed tonic-clonic seizure.  Loaded with Keppra.  Currently remains on Keppra 500 mg twice a day.  EEG 8/29 suggestive of severe diffuse encephalopathy. No seizures or epileptiform discharges were seen throughout the recording.  3.Acute metabolic encephalopathy/delirium/agitation:Probably due to stroke and seizure in the setting of underlying dementia.  Patient been restless, agitated, climbing out of the bed. Ativan and Haldol ordered as needed with Soft restraint Banker. Calm today. Underwent Echo/EEG/MRI 8/29.  Avoid oversedation. Aspiration/fall precautions as d/w nurse  4.AKI on CKD stage III: On maintenance IV fluids with improvement.  Continue monitoring.  Check magnesium and phosphorus.  5.Insulin-dependent type 2 diabetes: Well controlled on Lantus 7 units/SSI.  On decreased dose of insulin while n.p.o.  6.Hypertension: Permissive hypertension.  Not resuming any blood pressure medications.  7. Depression/anxiety with underlying dementia: Poor cognitive reserve.  Anticipate prolonged or suboptimal recovery with poor prognosis. Resumed home medications via NGT but not a candidate for long term PEG as d/w neurology. Palliative care consult    DVT prophylaxis: Heparin Code Status: Full code Family / Patient Communication: No family bedside. Called wife at listed number but could not reach/voice mail not set up per message Disposition Plan: TBD     LOS: 2 days    Time spent: 35 minutes    Guilford Shi, MD [facility] Pager [phone]  If 7PM-7AM, please contact night-coverage [email or web address] Password [facility] 12/11/2018, 3:39 PM

## 2018-12-11 NOTE — Progress Notes (Signed)
Cortrek slipped out with part of the tube still anchored in the Rt nostril ,  osmolite feeding stopped and suction done.  Doctor Strong notified.  Verbal order to discontinue the cortrek and start patient on IV normal saline at 70 cc per hour.  Some done and will passed on to the RN on night [place].

## 2018-12-11 NOTE — Evaluation (Signed)
Clinical/Bedside Swallow Evaluation Patient Details  Name: Peter Nicholson MRN: [id number] Date of Birth: 1941/03/26  Today's Date: 12/11/2018 Time: SLP Start Time (ACUTE ONLY): 0900 SLP Stop Time (ACUTE ONLY): 0913 SLP Time Calculation (min) (ACUTE ONLY): 13 min  Past Medical History:  Past Medical History:  Diagnosis Date  . CKD (chronic kidney disease), stage III ([place]) 12/08/2018  . Depression with anxiety 12/08/2018  . Essential hypertension 12/08/2018  . Insulin-requiring or dependent type II diabetes mellitus ([place]) 12/08/2018  . Ischemic stroke ([place]) 12/08/2018  . Normocytic anemia 12/08/2018  . Vascular dementia ([place]) 12/08/2018   Past Surgical History:  Past Surgical History:  Procedure Laterality Date  . CAROTID ENDARTERECTOMY    . CHOLECYSTECTOMY    . NEPHRECTOMY     HPI:  78 year old male with history of hypertension, insulin-dependent type 2 diabetes, CKD stage III, dementia, depression anxiety and recently stroke on 5/20 presented to the emergency department for evaluation of worsening right-sided weakness, difficult to talk.  Has history of dementia.  Wife noted acute onset aphasia and increasing difficulty with walking.   Assessment / Plan / Recommendation Clinical Impression  Pt kept eyes closed throughout this assessment. Oral care was completed with suction. Pt appears to have adequate natural dentition, but was able to consistently follow directions.Ice chips were given via spoon, which pt chewed and swallowed without overt oral issues or s/s aspiration. Pt refused to accept trials of thin liquid or puree. Recommend continuing to provide oral care with suction to minimize bacterial load, and give individual ice chips after oral care for comfort. Pt has cortrak in place for primary nutrition and hydration. SLP will continue to follow to assess readiness for additional po. Pt was unable to complete cognitive linguistic evaluation at this time, but has a history of previous CVA  and dementia at baseline.    SLP Visit Diagnosis: Dysphagia, unspecified (R13.10)    Aspiration Risk  Moderate aspiration risk    Diet Recommendation NPO   Medication Administration: Via alternative means    Other  Recommendations Oral Care Recommendations: Oral care prior to ice chip/H20 Other Recommendations: Have oral suction available   Follow up Recommendations 24 hour supervision/assistance      Frequency and Duration min 1 x/week  2 weeks;1 week       Prognosis Prognosis for Safe Diet Advancement: Fair      Swallow Study   General Date of Onset: 12/08/18 HPI: 78 year old male with history of hypertension, insulin-dependent type 2 diabetes, CKD stage III, dementia, depression anxiety and recently stroke on 5/20 presented to the emergency department for evaluation of worsening right-sided weakness, difficult to talk.  Has history of dementia.  Wife noted acute onset aphasia and increasing difficulty with walking. Type of Study: Bedside Swallow Evaluation Previous Swallow Assessment: none Diet Prior to this Study: NPO(cortrak) Temperature Spikes Noted: No(low grade temp) Respiratory Status: Room air History of Recent Intubation: No Behavior/Cognition: Cooperative;Lethargic/Drowsy Oral Cavity Assessment: Dry;Dried secretions Oral Care Completed by SLP: Yes Oral Cavity - Dentition: Adequate natural dentition Vision: (pt kept eyes closed during evaluation) Self-Feeding Abilities: Total assist Patient Positioning: Upright in bed Baseline Vocal Quality: Normal Volitional Cough: Cognitively unable to elicit Volitional Swallow: Unable to elicit    Oral/Motor/Sensory Function Overall Oral Motor/Sensory Function: (unable to assess)   Ice Chips Ice chips: Within functional limits Presentation: Spoon   Thin Liquid Thin Liquid: Not tested    Nectar Thick Nectar Thick Liquid: Not tested   Honey Thick Honey Thick Liquid: Not  tested   Puree Other Comments: pt would not  accept boluses of puree   Solid     Solid: Not tested     Peter Nicholson B. Peter Nicholson, [facility], [place] Speech Language Pathologist [phone]  Peter Nicholson 12/11/2018,9:13 AM

## 2018-12-12 DIAGNOSIS — Z515 Encounter for palliative care: Secondary | ICD-10-CM

## 2018-12-12 DIAGNOSIS — Z7189 Other specified counseling: Secondary | ICD-10-CM

## 2018-12-12 LAB — GLUCOSE, CAPILLARY
Glucose-Capillary: 171 mg/dL — ABNORMAL HIGH (ref 70–99)
Glucose-Capillary: 167 mg/dL — ABNORMAL HIGH (ref 70–99)
Glucose-Capillary: 100 mg/dL — ABNORMAL HIGH (ref 70–99)
Glucose-Capillary: 149 mg/dL — ABNORMAL HIGH (ref 70–99)

## 2018-12-12 MED ORDER — MORPHINE SULFATE (PF) 2 MG/ML IV SOLN
2.0000 mg | INTRAVENOUS | Status: DC
  Administered 2018-12-12 – 2018-12-14 (×12): 2 mg via INTRAVENOUS
  Filled 2018-12-12 (×11): qty 1

## 2018-12-12 MED ORDER — GLYCOPYRROLATE 0.2 MG/ML IJ SOLN
0.2000 mg | INTRAMUSCULAR | Status: DC | PRN
  Administered 2018-12-13: 22:00:00 0.2 mg via INTRAVENOUS

## 2018-12-12 MED ORDER — GLYCOPYRROLATE 0.2 MG/ML IJ SOLN
0.2000 mg | INTRAMUSCULAR | Status: DC | PRN
  Filled 2018-12-12: qty 1

## 2018-12-12 MED ORDER — MORPHINE SULFATE (PF) 2 MG/ML IV SOLN
2.0000 mg | INTRAVENOUS | Status: DC | PRN
  Filled 2018-12-12: qty 1

## 2018-12-12 MED ORDER — ASPIRIN 300 MG RE SUPP
300.0000 mg | Freq: Every day | RECTAL | Status: DC
  Administered 2018-12-12: 10:00:00 300 mg via RECTAL
  Filled 2018-12-12: qty 1

## 2018-12-12 MED ORDER — LORAZEPAM 2 MG/ML IJ SOLN
2.0000 mg | Freq: Four times a day (QID) | INTRAMUSCULAR | Status: DC
  Administered 2018-12-12 – 2018-12-14 (×6): 2 mg via INTRAVENOUS
  Filled 2018-12-12 (×6): qty 1

## 2018-12-12 MED ORDER — GLYCOPYRROLATE 1 MG PO TABS
1.0000 mg | ORAL_TABLET | ORAL | Status: DC | PRN
  Filled 2018-12-12: qty 1

## 2018-12-12 MED ORDER — HALOPERIDOL LACTATE 5 MG/ML IJ SOLN: 2.0000 mg | Freq: Four times a day (QID) | INTRAMUSCULAR | Status: DC | PRN

## 2018-12-12 NOTE — Progress Notes (Signed)
Physical Therapy Treatment Patient Details Name: Peter Nicholson MRN: [id number] DOB: [birth year] Today's Date: 12/12/2018    History of Present Illness Pt is a 78 yo male w/ Global aphasia. Moving all extremities but weaker on the right. CT showed progression of left MCA infarct comparing with 11/12/18. In ER, pt with 2 witnessed seizures. PMHx: multiple CVAs, HTN, HLD, T2DM.    PT Comments    Pt remains somnolent hindering ability to participate in therapy. He required +2 max/total bed mobility. He sat EOB x 5 minutes min guard assist, statically but unable to accept balance challenges without assist. He performed sit to stand +2 max assist, with static stand x 10 seconds +2 max assist. Eyes closed throughout session. Pt not interactive. Returned to supine at end of session.    Follow Up Recommendations  SNF;Supervision/Assistance - 24 hour     Equipment Recommendations  None recommended by PT    Recommendations for Other Services       Precautions / Restrictions Precautions Precautions: Fall;Other (comment) Precaution Comments: seizure, mitts on hands (pulled out cortrak 8/30)    Mobility  Bed Mobility Overal bed mobility: Needs Assistance Bed Mobility: Rolling;Supine to Sit;Sit to Supine Rolling: Max assist   Supine to sit: Total assist;+2 for physical assistance Sit to supine: Max assist;+2 for physical assistance   General bed mobility comments: minimal participation from pt after initiation of movement  Transfers Overall transfer level: Needs assistance Equipment used: 2 person hand held assist Transfers: Sit to/from Stand Sit to Stand: +2 physical assistance;Max assist         General transfer comment: Pt began initiating sit to stand from EOB. PT and tech assisting to complete transition. Static stand bedside x 10 seconds in flexed position. Unable to get pt to initiate 2nd attempt.  Ambulation/Gait             General Gait Details: unable   Stairs              Wheelchair Mobility    Modified Rankin (Stroke Patients Only) Modified Rankin (Stroke Patients Only) Pre-Morbid Rankin Score: Moderate disability Modified Rankin: Severe disability     Balance Overall balance assessment: Needs assistance Sitting-balance support: Feet supported;No upper extremity supported Sitting balance-Leahy Scale: Fair Sitting balance - Comments: static sit EOB x 5 minutes min guard assist   Standing balance support: Bilateral upper extremity supported;During functional activity Standing balance-Leahy Scale: Zero Standing balance comment: +2 to maintain static stance                            Cognition Arousal/Alertness: Lethargic;Suspect due to medications Behavior During Therapy: Flat affect Overall Cognitive Status: Difficult to assess                                 General Comments: eyes closed throughout session. Ocassional faint mumbling but no coherant speech.      Exercises      General Comments        Pertinent Vitals/Pain Pain Assessment: Faces Faces Pain Scale: No hurt    Home Living                      Prior Function            PT Goals (current goals can now be found in the care plan section) Acute Rehab PT Goals Patient  Stated Goal: unable to state PT Goal Formulation: Patient unable to participate in goal setting Time For Goal Achievement: 12/24/18 Potential to Achieve Goals: Fair Progress towards PT goals: Progressing toward goals    Frequency    Min 3X/week      PT Plan Current plan remains appropriate    Co-evaluation              AM-PAC PT "6 Clicks" Mobility   Outcome Measure  Help needed turning from your back to your side while in a flat bed without using bedrails?: A Lot Help needed moving from lying on your back to sitting on the side of a flat bed without using bedrails?: Total Help needed moving to and from a bed to a chair (including a  wheelchair)?: Total Help needed standing up from a chair using your arms (e.g., wheelchair or bedside chair)?: Total Help needed to walk in hospital room?: Total Help needed climbing 3-5 steps with a railing? : Total 6 Click Score: 7    End of Session   Activity Tolerance: Patient limited by lethargy Patient left: in bed;with call bell/phone within reach;with bed alarm set Nurse Communication: Mobility status PT Visit Diagnosis: Muscle weakness (generalized) (M62.81);Difficulty in walking, not elsewhere classified (R26.2);Hemiplegia and hemiparesis Hemiplegia - Right/Left: Right Hemiplegia - dominant/non-dominant: Dominant Hemiplegia - caused by: Cerebral infarction     Time: 1206-1220 PT Time Calculation (min) (ACUTE ONLY): 14 min  Charges:  $Therapeutic Activity: 8-22 mins                     Peter Nicholson, PT  Office # [phone] Pager [phone]    Lorriane Shire 12/12/2018, 1:42 PM

## 2018-12-12 NOTE — Progress Notes (Signed)
Pt family arrived at bedside at 1445. Palliative provider notified of family arrival. D[place] Mearl [place] called for update, phone number added to chart.

## 2018-12-12 NOTE — Progress Notes (Signed)
   12/12/18 1115  MEWS Score  Resp (!) 21  Pulse Rate (!) 114  BP (!) 147/72  Temp 100.3 F (37.9 C) (RN notified)  SpO2 97 %  O2 Device Room Air  MEWS Score  MEWS RR 1  MEWS Pulse 2  MEWS Systolic 0  MEWS LOC 1  MEWS Temp 0  MEWS Score 4  MEWS Score Color Red  MEWS Assessment  Is this an acute change? No  MEWS guidelines implemented *See Row Information* Red  Rapid Response Notification  Name of Rapid Response RN Notified Nikki  Date Rapid Response Notified 12/12/18  Time Rapid Response Notified [id number]  Provider Notification  Provider Name/Title Kamineni (At bedside at time of reassessment)  Date Provider Notified 12/12/18  Time Provider Notified 1155  Notification Type Rounds  Response No new orders

## 2018-12-12 NOTE — Consult Note (Signed)
Consultation Note Date: 12/12/2018   Patient Name: Peter Nicholson  DOB: [birth year]  MRN: [id number]  Age / Sex: 78 y.o., male  PCP: No primary care provider on file. Referring Physician: Guilford Shi, MD  Reason for Consultation: Establishing goals of care  HPI/Patient Profile: 78 y.o. male  with past medical history of diabetes type 2, CKD stage III, dementia, depression, anxiety, and recent stroke admitted on 12/08/2018 with acute or subacute extension infarction in the left parietal lobe. He was witnessed to have multiple seizures in the ED. Since admission he has remained encephalopathic with intermittent agitation and somnolence. He pulled out his NG tube. Palliative medicine consulted for [place].    Clinical Assessment and Goals of Care:  I have reviewed medical records including EPIC notes, labs and imaging, received report from patient's nurse, assessed the patient and then met at the bedside along with patient's spouse- [name], daughter Santiago Glad, grandson Denyse Amass and friend Grandville Silos  to discuss diagnosis prognosis, [place], EOL wishes, disposition and options.  I introduced Palliative Medicine as specialized medical care for people living with serious illness. It focuses on providing relief from the symptoms and stress of a serious illness. The goal is to improve quality of life for both the patient and the family.  We discussed a brief life review of the patient. He is knows for his love of music and dancing. Family is very important to him. Being outside is very important to him.  We discussed his current illness and what it means in the larger context of his on-going co-morbidities.  Natural disease trajectory and expectations at EOL were discussed. All family is in agreement that patient would not want a prolonged illness or death in the event that he would not be expected to recover to a level of function that  would allow him to return home. He would not want artificial feeding, he would not want to reside in a SNF for any amount of time. He would wish to be supported through a natural dying process with comfort and dignity.  The difference between aggressive medical intervention and comfort care was considered in light of the patient's goals of care.   Advanced directives, concepts specific to code status, artifical feeding and hydration, and rehospitalization were considered and discussed. All agreed to transition to full comfort care only. DNR, medications and interventions only for comfort.  Hospice and services outpatient were explained and offered. Family requests placement at residential Hospice- they request [facility] due to proximity. Hard Choices booklet left for review. The family was encouraged to call with questions or concerns.    Primary Decision Maker NEXT OF KIN- spouse- Abelina Bachelor    SUMMARY OF RECOMMENDATIONS -DNR -Comfort measures- patient will need aggressive symptom management for agitation -Lorazepam 11m q6hr IV -Morphine 216mq4hr and [id number] for agitation, SOB -Haldol IV prn for agitation  Code Status/Advance Care Planning:  DNR  Prognosis:    < 2 weeks due to acute CVA with resulting encephalopathy, no longer  eating or drinking, transition to full comfort measures  Discharge Planning: Hospice facility  Primary Diagnoses: Present on Admission: . Ischemic stroke ([place]) . Essential hypertension . Vascular dementia ([place]) . Depression with anxiety . Acute renal failure superimposed on stage 3 chronic kidney disease ([place]) . Normocytic anemia . Acute ischemic stroke ([place])   I have reviewed the medical record, interviewed the patient and family, and examined the patient. The following aspects are pertinent.  Past Medical History:  Diagnosis Date  . CKD (chronic kidney disease), stage III ([place]) 12/08/2018  . Depression with anxiety 12/08/2018   . Essential hypertension 12/08/2018  . Insulin-requiring or dependent type II diabetes mellitus ([place]) 12/08/2018  . Ischemic stroke ([place]) 12/08/2018  . Normocytic anemia 12/08/2018  . Vascular dementia ([place]) 12/08/2018   Family History  Problem Relation Age of Onset  . Heart attack Father    Scheduled Meds: . aspirin  300 mg Rectal Daily  . chlorhexidine  15 mL Mouth/Throat QID  . heparin  5,000 Units Subcutaneous Q8H  . insulin aspart  0-9 Units Subcutaneous Q4H  . insulin glargine  7 Units Subcutaneous QHS   Continuous Infusions: . sodium chloride Stopped (12/12/18 0933)  . levETIRAcetam 500 mg (12/12/18 0933)   PRN Meds:.acetaminophen **OR** acetaminophen (TYLENOL) oral liquid 160 mg/5 mL **OR** acetaminophen, haloperidol lactate, labetalol, LORazepam Medications Prior to Admission:  Prior to Admission medications   Medication Sig Start Date End Date Taking? Authorizing Provider  amLODipine (NORVASC) 5 MG tablet Take 5 mg by mouth daily.   Yes [provider]  clopidogrel (PLAVIX) 75 MG tablet Take 75 mg by mouth daily.   Yes [provider]  donepezil (ARICEPT) 10 MG tablet Take 10 mg by mouth 2 (two) times daily.   Yes [provider]  glipiZIDE (GLUCOTROL XL) 5 MG 24 hr tablet Take 5 mg by mouth daily with breakfast.   Yes [provider]  insulin glargine (LANTUS) 100 UNIT/ML injection Inject 30 Units into the skin daily.    Yes [provider]  memantine (NAMENDA) 10 MG tablet Take 10 mg by mouth 2 (two) times daily.   Yes [provider]  metFORMIN (GLUCOPHAGE) 500 MG tablet Take 500 mg by mouth 2 (two) times daily with a meal.   Yes [provider]  pantoprazole (PROTONIX) 40 MG tablet Take 40 mg by mouth daily.   Yes [provider]  sertraline (ZOLOFT) 50 MG tablet Take 50 mg by mouth daily.   Yes [provider]   No Known Allergies Review of Systems  Unable to perform ROS: Mental status  change    Physical Exam Vitals signs and nursing note reviewed.  Constitutional:      Appearance: He is ill-appearing.  Skin:    Coloration: Skin is pale.  Neurological:     Mental Status: He is disoriented.     Comments: Intermittently agitated, does not follow commands     Vital Signs: BP 133/65 (BP Location: Right Arm)   Pulse 100   Temp 100.3 F (37.9 C) (Axillary) Comment: RN notified  Resp (!) 21   Ht _0  (1.676 m)   Wt 78 kg   SpO2 99%   BMI 27.75 kg/m  Pain Scale: PAINAD   Pain Score: Asleep   SpO2: SpO2: 99 % O2 Device:SpO2: 99 % O2 Flow Rate: .O2 Flow Rate (L/min): 2 L/min  IO: Intake/output summary:   Intake/Output Summary (Last 24 hours) at 12/12/2018 1621 Last data filed at 12/12/2018  3664 Gross per 24 hour  Intake 1264.24 ml  Output 400 ml  Net 864.24 ml    LBM: Last BM Date: 12/10/18 Baseline Weight: Weight: 75.3 kg Most recent weight: Weight: 78 kg     Palliative Assessment/Data: PPS: 10%     Thank you for this consult. Palliative medicine will continue to follow and assist as needed.   Time In: 1500 Time Out: 1630 Time Total: 90 minutes Greater than 50%  of this time was spent counseling and coordinating care related to the above assessment and plan.  Signed by: Mariana Kaufman, AGNP-C Palliative Medicine    Please contact Palliative Medicine Team phone at [phone] for questions and concerns.  For individual provider: See Shea Evans

## 2018-12-12 NOTE — Progress Notes (Signed)
   12/12/18 1153  MEWS Score  Resp 19  ECG Heart Rate (!) 106  Pulse Rate (!) 102  BP (!) 150/75 (RN Reassessment)  SpO2 96 %  O2 Device Room Air  MEWS Score  MEWS RR 0  MEWS Pulse 1  MEWS Systolic 0  MEWS LOC 1  MEWS Temp 0  MEWS Score 2  MEWS Score Color Yellow   RN Reassessment of MEWS score. Will continue MEWS protocol

## 2018-12-12 NOTE — Progress Notes (Addendum)
  Speech Language Pathology Treatment:    Patient Details Name: Peter Nicholson MRN: [id number] DOB: Sep 03, 1940 Today's Date: 12/12/2018 Time: [id number] SLP Time Calculation (min) (ACUTE ONLY): 13 min  Assessment / Plan / Recommendation Clinical Impression  Pt presented difficult to rouse due to recent ativan administration. He remained lethargic throughout the session and did not follow commands. Upon oral examination, dried secretions were present on his lips as well as a healing sore on his velum. Oral care was provided and SLP was able to remove labial residue. Ice chip PO was provided to moisten his mouth and assess current swallowing fx. Pt recieved 3 small ice chips, held them, and chewed with max cueing. He produced one swallow throughout the trials. Vocal quality was not able to be assessed. Due to pts decreased awareness of bolus and level of alertness, further PO trials were not adminsitered. Pts current level of participation is more likely a result of his recent sedation than an accurate representation of his progress. Continue NPO with ice chips after oral care, SLP f/u recommended when pt is more alert.    HPI HPI: 78 year old male with history of hypertension, insulin-dependent type 2 diabetes, CKD stage III, dementia, depression anxiety and recently stroke on 5/20 presented to the emergency department for evaluation of worsening right-sided weakness, difficult to talk.  Has history of dementia.  Wife noted acute onset aphasia and increasing difficulty with walking.      SLP Plan          Recommendations  Medication Administration: Via alternative means                Oral Care Recommendations: Oral care QID;Oral care prior to ice chip/H20 Follow up Recommendations: 24 hour supervision/assistance SLP Visit Diagnosis: Dysphagia, unspecified (R13.10)                       Elisama Thissen 12/12/2018, 10:42 AM

## 2018-12-12 NOTE — Care Management Important Message (Signed)
Important Message  Patient Details  Name: CADIEN ENSEY MRN: [id number] Date of Birth: [birth year]   Medicare Important Message Given:  Yes  Due to illness patient was not able to sign.  Signed copy left at the patient bedside.    Hoy Fallert 12/12/2018, 1:29 PM

## 2018-12-12 NOTE — Progress Notes (Addendum)
PROGRESS NOTE    Peter Nicholson  [id number]  DOB: 11/23/1940  DOA: 12/08/2018 PCP: No primary care provider on file.  Brief Narrative:   78 year old male with history of hypertension, insulin-dependent type 2 diabetes, CKD stage III, dementia, depression anxiety and recently stroke on 5/20 presented to the emergency department for evaluation of worsening right-sided weakness, difficult to talk.  Has history of dementia.  Wife noted acute onset aphasia and increasing difficulty with walking.  No seizures at home.  Upon arrival to the emergency room, patient was afebrile, on room air.  Slight renal insufficiency.  Stable normocytic anemia.  CT head showed acute or subacute extension of infarction of left parietal lobe.  No emergent large vessel occlusion.  While in the emergency room, he had 2 witnessed generalized tonic-clonic seizure.  Remains encephalopathic since then.  Subjective:  Pulled out NGT last night , intermittently restless  Objective: Vitals:   12/12/18 0406 12/12/18 0700 12/12/18 0802 12/12/18 0842  BP: 135/71  (!) 157/97 (!) 148/93  Pulse: (!) 118 (!) 101 (!) 115 (!) 104  Resp: (!) 22 16 20  (P) 20  Temp: 97.6 F (36.4 C)  99.1 F (37.3 C)   TempSrc: Axillary  Axillary   SpO2: 100% 99% 99% 98%  Weight:      Height:        Intake/Output Summary (Last 24 hours) at 12/12/2018 0842 Last data filed at 12/11/2018 1757 Gross per 24 hour  Intake -  Output 400 ml  Net -400 ml   Filed Weights   12/08/18 2030 12/10/18 0333 12/11/18 0400  Weight: 75.3 kg 76 kg 78 kg    Physical Examination:  General exam: Appears drowsy, easily arousable and moans. NG tube in place Respiratory system: Clear to auscultation. Respiratory effort normal. Cardiovascular system: S1 & S2 heard, RRR. No pedal edema. Gastrointestinal system: Abdomen is nondistended, soft and nontender. No organomegaly or masses felt. Normal bowel sounds heard. Central nervous system: Somnolent/drowsy,  disoriented, right hemiparesis Extremities: Symmetric 5 x 5 power. Skin: No rashes, lesions or ulcers Psychiatry: Judgement and insight impaired   Data Reviewed: I have personally reviewed following labs and imaging studies  CBC: Recent Labs  Lab 12/08/18 1827 12/08/18 1855 12/10/18 0627  WBC 7.8  --  10.1  NEUTROABS 6.2  --  8.0*  HGB 9.4* 10.2* 8.9*  HCT 30.5* 30.0* 29.1*  MCV 96.8  --  96.0  PLT 236  --  [id number]   Basic Metabolic Panel: Recent Labs  Lab 12/08/18 1827 12/08/18 1855 12/09/18 0335 12/10/18 0627  NA 139 142 142 142  K 4.4 4.5 4.2 4.1  CL 108 108 112* 110  CO2 22  --  20* 23  GLUCOSE 277* 258* 146* 226*  BUN 29* 29* 23 24*  CREATININE 2.04* 1.90* 1.80* 1.71*  CALCIUM 9.0  --  8.8* 9.0  MG  --   --   --  1.4*  PHOS  --   --   --  2.6   GFR: Estimated Creatinine Clearance: 34.4 mL/min (A) (by C-G formula based on SCr of 1.71 mg/dL (H)). Liver Function Tests: Recent Labs  Lab 12/08/18 1827 12/09/18 0335  AST 19 25  ALT 19 19  ALKPHOS 86 82  BILITOT 0.5 0.3  PROT 6.3* 5.8*  ALBUMIN 3.4* 3.2*   No results for input(s): LIPASE, AMYLASE in the last 168 hours. No results for input(s): AMMONIA in the last 168 hours. Coagulation Profile: Recent Labs  Lab 12/08/18 1827  INR 1.0   Cardiac Enzymes: No results for input(s): CKTOTAL, CKMB, CKMBINDEX, TROPONINI in the last 168 hours. BNP (last 3 results) No results for input(s): PROBNP in the last 8760 hours. HbA1C: No results for input(s): HGBA1C in the last 72 hours. CBG: Recent Labs  Lab 12/11/18 1559 12/11/18 2020 12/11/18 2348 12/12/18 0459 12/12/18 0756  GLUCAP 323* 201* 151* 171* 167*   Lipid Profile: No results for input(s): CHOL, HDL, LDLCALC, TRIG, CHOLHDL, LDLDIRECT in the last 72 hours. Thyroid Function Tests: No results for input(s): TSH, T4TOTAL, FREET4, T3FREE, THYROIDAB in the last 72 hours. Anemia Panel: No results for input(s): VITAMINB12, FOLATE, FERRITIN, TIBC, IRON,  RETICCTPCT in the last 72 hours. Sepsis Labs: No results for input(s): PROCALCITON, LATICACIDVEN in the last 168 hours.  Recent Results (from the past 240 hour(s))  SARS Coronavirus 2 [facility] order, Performed in [facility] hospital lab) Nasopharyngeal Nasopharyngeal Swab     Status: None   Collection Time: 12/08/18  8:45 PM   Specimen: Nasopharyngeal Swab  Result Value Ref Range Status   SARS Coronavirus 2 NEGATIVE NEGATIVE Final    Comment: (NOTE) If result is NEGATIVE SARS-CoV-2 target nucleic acids are NOT DETECTED. The SARS-CoV-2 RNA is generally detectable in upper and lower  respiratory specimens during the acute phase of infection. The lowest  concentration of SARS-CoV-2 viral copies this assay can detect is 250  copies / mL. A negative result does not preclude SARS-CoV-2 infection  and should not be used as the sole basis for treatment or other  patient management decisions.  A negative result may occur with  improper specimen collection / handling, submission of specimen other  than nasopharyngeal swab, presence of viral mutation(s) within the  areas targeted by this assay, and inadequate number of viral copies  (<250 copies / mL). A negative result must be combined with clinical  observations, patient history, and epidemiological information. If result is POSITIVE SARS-CoV-2 target nucleic acids are DETECTED. The SARS-CoV-2 RNA is generally detectable in upper and lower  respiratory specimens dur ing the acute phase of infection.  Positive  results are indicative of active infection with SARS-CoV-2.  Clinical  correlation with patient history and other diagnostic information is  necessary to determine patient infection status.  Positive results do  not rule out bacterial infection or co-infection with other viruses. If result is PRESUMPTIVE POSTIVE SARS-CoV-2 nucleic acids MAY BE PRESENT.   A presumptive positive result was obtained on the submitted specimen  and  confirmed on repeat testing.  While 2019 novel coronavirus  (SARS-CoV-2) nucleic acids may be present in the submitted sample  additional confirmatory testing may be necessary for epidemiological  and / or clinical management purposes  to differentiate between  SARS-CoV-2 and other Sarbecovirus currently known to infect humans.  If clinically indicated additional testing with an alternate test  methodology [phone]) is advised. The SARS-CoV-2 RNA is generally  detectable in upper and lower respiratory sp ecimens during the acute  phase of infection. The expected result is Negative. Fact Sheet for Patients:  StrictlyIdeas.no Fact Sheet for Healthcare Providers: BankingDealers.co.za This test is not yet approved or cleared by the [place] FDA and has been authorized for detection and/or diagnosis of SARS-CoV-2 by FDA under an Emergency Use Authorization (EUA).  This EUA will remain in effect (meaning this test can be used) for the duration of the COVID-19 declaration under Section 564(b)(1) of the Act, 21 U.S.C. section 360bbb-3(b)(1), unless the authorization is terminated or revoked sooner.  Performed at [facility], [place] [address], [place], [place] [address]       Radiology Studies: Mr Brain Wo Contrast  Result Date: 12/10/2018 CLINICAL DATA:  Follow-up stroke.  Right-sided weakness and aphasia. EXAM: MRI HEAD WITHOUT CONTRAST TECHNIQUE: Multiplanar, multiecho pulse sequences of the brain and surrounding structures were obtained without intravenous contrast. COMPARISON:  CT studies 12/08/2018 FINDINGS: Brain: Today's exam suffers from considerable motion degradation. No acute infarction is seen affecting the brainstem, cerebellum or right cerebral hemisphere. MRI confirms the acute extension of infarction in the right parietal lobe adjacent to an old cortical and subcortical infarction. No blood products are detected. No mass  effect. Old small vessel infarctions are seen throughout the cerebellum. Old watershed distribution infarction in the left hemisphere affecting the cortical and subcortical brain in the deep white matter. No evidence of mass, hydrocephalus or extra-axial collection. Vascular: Major vessels at the base of the brain show flow. Skull and upper cervical spine: Negative Sinuses/Orbits: Clear/normal Other: None IMPRESSION: Acute infarction in the left parietal cortical and subcortical brain adjacent to an area of old infarction. Old watershed stroke in the left hemisphere. Chronic small vessel insults as described above. Electronically Signed   By: Nelson Chimes [name]   On: 12/10/2018 12:28        Scheduled Meds: . aspirin  81 mg Per Tube Daily  . atorvastatin  80 mg Per Tube q1800  . chlorhexidine  15 mL Mouth/Throat QID  . clopidogrel  75 mg Per Tube Daily  . feeding supplement (PRO-STAT SUGAR FREE 64)  30 mL Per Tube Daily  . heparin  5,000 Units Subcutaneous Q8H  . insulin aspart  0-9 Units Subcutaneous Q4H  . insulin glargine  7 Units Subcutaneous QHS   Continuous Infusions: . sodium chloride 70 mL/hr at 12/11/18 1934  . feeding supplement (OSMOLITE 1.5 CAL) 1,000 mL (12/11/18 1301)  . levETIRAcetam 500 mg (12/11/18 2129)    Assessment & Plan:   1. Acute ischemic stroke with right-sided weakness and aphasia: CT head on admission showed acute/subacute extension of prior left MCA stroke. MRI of the brain,showed acute infarction in the left parietal cortical and subcortical brain adjacent to an area of old infarction. Old watershed stroke in the left hemisphere as well as several chronic small vessel insults. CTA head and neck: Chronic bilateral occlusion with severe atherosclerotic disease unchanged from before.2D echocardiogram 8/29 showed left ventricle hyperdynamic systolic function, with an ejection fraction of >65% , on dual antiplatelet therapy, aspirin and Plavix at home. Resumed  via NGT  while here but he pulled out NGT overnight. LDL 38, on a statin. Appreciate neurology input. Seen by speech, PT OT.  Meaningful recovery is skeptical at this point. Consulted palliative care to discuss care goals with family.  I have also discussed with wife and she understands grave prognosis. Will transition aspirin to rectal for now. Hold other po meds  2. Generalized tonic-clonic seizure: Probably with lower seizure threshold with cortical stroke.  2 witnessed tonic-clonic seizure.  Loaded with Keppra.  Currently remains on Keppra 500 mg twice a day-transition to IV as pulled out NGT.  EEG 8/29 suggestive of severe diffuse encephalopathy. No seizures or epileptiform discharges were seen throughout the recording.  3.Acute metabolic encephalopathy/delirium/agitation:Probably due to stroke and seizure in the setting of underlying dementia.  Patient been restless, agitated, climbing out of the bed. Ativan and Haldol ordered as needed with Soft restraint Banker. Calm today. Underwent Echo/EEG/MRI 8/29. Avoid  oversedation. Aspiration/fall precautions as d/w nurse  4.AKI on CKD stage III: On maintenance IV fluids with improvement.  Continue monitoring.  Check magnesium and phosphorus.  5.Insulin-dependent type 2 diabetes: Well controlled on Lantus 7 units/SSI.  On decreased dose of insulin while n.p.o.  6.Hypertension: Permissive hypertension.  Not resuming any blood pressure medications.  7. Depression/anxiety with underlying dementia: Poor cognitive reserve.  Anticipate prolonged or suboptimal recovery with poor prognosis. Resumed home medications via NGT but not a candidate for long term PEG as d/w neurology. Palliative care consult  8. FEN: Patient pulled out NG tube and no means on enteral feeding currently. Not a good candidate for PEG. D/W wife who may choose palliative care/comfort options. Palliative care team will f/u with her and patients daughters. IV fluids for now  DVT  prophylaxis: Heparin Code Status: Full code Family / Patient Communication: d/w wife Peter Nicholson Disposition Plan: TBD     LOS: 3 days    Time spent: 88 minutes    Guilford Shi, MD [facility] Pager [phone]  If 7PM-7AM, please contact night-coverage [email or web address] Password [facility] 12/12/2018, 8:42 AM

## 2018-12-13 DIAGNOSIS — Z515 Encounter for palliative care: Secondary | ICD-10-CM

## 2018-12-13 MED ORDER — IBUPROFEN 200 MG PO TABS: 600.0000 mg | ORAL_TABLET | Freq: Once | ORAL | Status: DC

## 2018-12-13 NOTE — Progress Notes (Signed)
Now family is exploring options for patient to go home with home hospice.  He will need all-time complete assist.  Will discontinue discharge to hospice home pending family decision.

## 2018-12-13 NOTE — Discharge Summary (Signed)
Physician Discharge Summary  [name] SKAU [id number] DOB: 1940/10/14 DOA: 12/08/2018  PCP: No primary care provider on file.  Admit date: 12/08/2018 Discharge date: 12/13/2018  Admitted From: home  Disposition:  [place]: Not applicable Equipment/Devices: Not applicable  Discharge Condition: Serious, terminal CODE STATUS: DNR Diet recommendation: Comfort food  Discharge Summary: 78 year old male with history of hypertension, insulin-dependent type 2 diabetes, CKD stage III, dementia, depression, anxiety and recent stroke on 5/20 presented to the emergency department for evaluation of worsening right-sided weakness, difficult to talk. Has history of dementia. Wife noted acute onset aphasia and increasing difficulty with walking. No seizures at home. Upon arrival to the emergency room, patient was afebrile, on room air. Slight renal insufficiency. Stable normocytic anemia. CT head showed acute or subacute extension of infarction of left parietal lobe. No emergent large vessel occlusion. While in the emergency room, he had 2 witnessed generalized tonic-clonic seizure. Remains encephalopathic since then.  Acute ischemic stroke with right-sided weakness and aphasia and persistent encephalopathy Generalized tonic-clonic seizure with prolonged postictal state/delirium and agitation Acute kidney injury Dementia  Given overall poor prognosis and outcome, poor neurological outcome, unable to eat and no desire for artificial feeding, patient was evaluated by palliative care and hospice and family agreed on hospice level of care for this patient. Patient does have short lifespan with no longer eating or drinking, persistent encephalopathy and is expected to die soon. He has all comfort care medications available, he will be transitioned to hospice today.   Discharge Diagnoses:  Principal Problem:   Ischemic stroke [facility]) Active Problems:   Insulin-requiring or  dependent type II diabetes mellitus ([place])   Essential hypertension   Vascular dementia ([place])   Depression with anxiety   Acute renal failure superimposed on stage 3 chronic kidney disease (HCC)   Normocytic anemia   Seizure ([place])   Acute ischemic stroke [facility])   Advanced care planning/counseling discussion   Goals of care, counseling/discussion   Palliative care by specialist    Discharge Instructions   Allergies as of 12/13/2018   No Known Allergies     Medication List    STOP taking these medications   amLODipine 5 MG tablet Commonly known as: NORVASC   clopidogrel 75 MG tablet Commonly known as: PLAVIX   donepezil 10 MG tablet Commonly known as: ARICEPT   glipiZIDE 5 MG 24 hr tablet Commonly known as: GLUCOTROL XL   insulin glargine 100 UNIT/ML injection Commonly known as: LANTUS   memantine 10 MG tablet Commonly known as: NAMENDA   metFORMIN 500 MG tablet Commonly known as: GLUCOPHAGE   pantoprazole 40 MG tablet Commonly known as: PROTONIX     TAKE these medications   sertraline 50 MG tablet Commonly known as: ZOLOFT Take 50 mg by mouth daily.       No Known Allergies  Consultations:  Neurology  Palliative care medicine   Procedures/Studies: Ct Angio Head W Or Wo Contrast  Result Date: 12/08/2018 CLINICAL DATA:  Right-sided weakness and aphasia EXAM: CT ANGIOGRAPHY HEAD AND NECK TECHNIQUE: Multidetector CT imaging of the head and neck was performed using the standard protocol during bolus administration of intravenous contrast. Multiplanar CT image reconstructions and MIPs were obtained to evaluate the vascular anatomy. Carotid stenosis measurements (when applicable) are obtained utilizing NASCET criteria, using the distal internal carotid diameter as the denominator. CONTRAST:  20mL OMNIPAQUE IOHEXOL 350 MG/ML SOLN COMPARISON:  CTA head neck 09/02/2018 FINDINGS: CTA NECK FINDINGS SKELETON: There is  no bony spinal canal stenosis. No lytic or  blastic lesion. OTHER NECK: Normal pharynx, larynx and major salivary glands. No cervical lymphadenopathy. Unremarkable thyroid gland. UPPER CHEST: No pneumothorax or pleural effusion. No nodules or masses. AORTIC ARCH: There is mild calcific atherosclerosis of the aortic arch. There is no aneurysm, dissection or hemodynamically significant stenosis of the visualized ascending aorta and aortic arch. Conventional 3 vessel aortic branching pattern. The visualized proximal subclavian arteries are widely patent. RIGHT CAROTID SYSTEM: --Common carotid artery: Widely patent origin without common carotid artery dissection or aneurysm. --Internal carotid artery: No dissection, occlusion or aneurysm. Mild atherosclerotic calcification at the carotid bifurcation without hemodynamically significant stenosis. --External carotid artery: No acute abnormality. LEFT CAROTID SYSTEM: --Common carotid artery: Widely patent origin without common carotid artery dissection or aneurysm. Mixed calcified and non-calcified atherosclerotic disease at the bifurcation, extending into the internal carotid artery, resulting in less than 50% stenosis. --Internal carotid artery: There is severe long segment narrowing the left internal carotid artery with an angiographic string sign. However, this is unchanged compared to 09/02/2018. The ICA is patent to the skull base. There is near total loss of enhancement at the petrous segment. --External carotid artery: No acute abnormality. VERTEBRAL ARTERIES: Left dominant configuration. Both origins are clearly patent. The right vertebral artery is occluded at the V3 V4 junction, unchanged. CTA HEAD FINDINGS POSTERIOR CIRCULATION: --Vertebral arteries: The right V4 segment is occluded. There is severe stenosis of the left V4 segment secondary to atherosclerotic calcification. --Posterior inferior cerebellar arteries (PICA): Both are patent --Anterior inferior cerebellar arteries (AICA): Patent origins from  the basilar artery. --Basilar artery: Normal. --Superior cerebellar arteries: Normal. --Posterior cerebral arteries: The right PCA is diminutive, unchanged. Left PCA is normal. ANTERIOR CIRCULATION: --Intracranial internal carotid arteries: The left internal carotid artery is occluded over a short segment at the distal petrous and cavernous segments. The carotid terminus is patent but severely narrowed, unchanged. --Anterior cerebral arteries (ACA): Non opacification of the left A1 segment, unchanged. Both distal anterior cerebral arteries are patent. The anterior communicating arteries patent. --Middle cerebral arteries (MCA): The right MCA is normal. Unchanged attenuated appearance of the left MCA without occlusion. VENOUS SINUSES: As permitted by contrast timing, patent. ANATOMIC VARIANTS: None Review of the MIP images confirms the above findings. IMPRESSION: 1. No emergent large vessel occlusion. 2. Unchanged severe long segment stenosis of the left internal carotid artery with areas of complete loss of enhancement at the skull base. 3. Unchanged occlusion of the right vertebral artery V4 segment and severe stenosis of the left V4 segment. 4. Unchanged narrowed left middle cerebral artery without occlusion. 5.  Aortic atherosclerosis (ICD10-I70.0). 6. These results were called by telephone at the time of interpretation on 12/08/2018 at 7:35 pm to Dr. Kerney Elbe , who verbally acknowledged these results. Electronically Signed   By: Ulyses Jarred [name]   On: 12/08/2018 19:51   Ct Angio Neck W Or Wo Contrast  Result Date: 12/08/2018 CLINICAL DATA:  Right-sided weakness and aphasia EXAM: CT ANGIOGRAPHY HEAD AND NECK TECHNIQUE: Multidetector CT imaging of the head and neck was performed using the standard protocol during bolus administration of intravenous contrast. Multiplanar CT image reconstructions and MIPs were obtained to evaluate the vascular anatomy. Carotid stenosis measurements (when applicable) are  obtained utilizing NASCET criteria, using the distal internal carotid diameter as the denominator. CONTRAST:  27mL OMNIPAQUE IOHEXOL 350 MG/ML SOLN COMPARISON:  CTA head neck 09/02/2018 FINDINGS: CTA NECK FINDINGS SKELETON: There is no bony spinal canal stenosis.  No lytic or blastic lesion. OTHER NECK: Normal pharynx, larynx and major salivary glands. No cervical lymphadenopathy. Unremarkable thyroid gland. UPPER CHEST: No pneumothorax or pleural effusion. No nodules or masses. AORTIC ARCH: There is mild calcific atherosclerosis of the aortic arch. There is no aneurysm, dissection or hemodynamically significant stenosis of the visualized ascending aorta and aortic arch. Conventional 3 vessel aortic branching pattern. The visualized proximal subclavian arteries are widely patent. RIGHT CAROTID SYSTEM: --Common carotid artery: Widely patent origin without common carotid artery dissection or aneurysm. --Internal carotid artery: No dissection, occlusion or aneurysm. Mild atherosclerotic calcification at the carotid bifurcation without hemodynamically significant stenosis. --External carotid artery: No acute abnormality. LEFT CAROTID SYSTEM: --Common carotid artery: Widely patent origin without common carotid artery dissection or aneurysm. Mixed calcified and non-calcified atherosclerotic disease at the bifurcation, extending into the internal carotid artery, resulting in less than 50% stenosis. --Internal carotid artery: There is severe long segment narrowing the left internal carotid artery with an angiographic string sign. However, this is unchanged compared to 09/02/2018. The ICA is patent to the skull base. There is near total loss of enhancement at the petrous segment. --External carotid artery: No acute abnormality. VERTEBRAL ARTERIES: Left dominant configuration. Both origins are clearly patent. The right vertebral artery is occluded at the V3 V4 junction, unchanged. CTA HEAD FINDINGS POSTERIOR CIRCULATION:  --Vertebral arteries: The right V4 segment is occluded. There is severe stenosis of the left V4 segment secondary to atherosclerotic calcification. --Posterior inferior cerebellar arteries (PICA): Both are patent --Anterior inferior cerebellar arteries (AICA): Patent origins from the basilar artery. --Basilar artery: Normal. --Superior cerebellar arteries: Normal. --Posterior cerebral arteries: The right PCA is diminutive, unchanged. Left PCA is normal. ANTERIOR CIRCULATION: --Intracranial internal carotid arteries: The left internal carotid artery is occluded over a short segment at the distal petrous and cavernous segments. The carotid terminus is patent but severely narrowed, unchanged. --Anterior cerebral arteries (ACA): Non opacification of the left A1 segment, unchanged. Both distal anterior cerebral arteries are patent. The anterior communicating arteries patent. --Middle cerebral arteries (MCA): The right MCA is normal. Unchanged attenuated appearance of the left MCA without occlusion. VENOUS SINUSES: As permitted by contrast timing, patent. ANATOMIC VARIANTS: None Review of the MIP images confirms the above findings. IMPRESSION: 1. No emergent large vessel occlusion. 2. Unchanged severe long segment stenosis of the left internal carotid artery with areas of complete loss of enhancement at the skull base. 3. Unchanged occlusion of the right vertebral artery V4 segment and severe stenosis of the left V4 segment. 4. Unchanged narrowed left middle cerebral artery without occlusion. 5.  Aortic atherosclerosis (ICD10-I70.0). 6. These results were called by telephone at the time of interpretation on 12/08/2018 at 7:35 pm to Dr. Kerney Elbe , who verbally acknowledged these results. Electronically Signed   By: Ulyses Jarred [name]   On: 12/08/2018 19:51   Mr Brain Wo Contrast  Result Date: 12/10/2018 CLINICAL DATA:  Follow-up stroke.  Right-sided weakness and aphasia. EXAM: MRI HEAD WITHOUT CONTRAST TECHNIQUE:  Multiplanar, multiecho pulse sequences of the brain and surrounding structures were obtained without intravenous contrast. COMPARISON:  CT studies 12/08/2018 FINDINGS: Brain: Today's exam suffers from considerable motion degradation. No acute infarction is seen affecting the brainstem, cerebellum or right cerebral hemisphere. MRI confirms the acute extension of infarction in the right parietal lobe adjacent to an old cortical and subcortical infarction. No blood products are detected. No mass effect. Old small vessel infarctions are seen throughout the cerebellum. Old watershed distribution infarction in the  left hemisphere affecting the cortical and subcortical brain in the deep white matter. No evidence of mass, hydrocephalus or extra-axial collection. Vascular: Major vessels at the base of the brain show flow. Skull and upper cervical spine: Negative Sinuses/Orbits: Clear/normal Other: None IMPRESSION: Acute infarction in the left parietal cortical and subcortical brain adjacent to an area of old infarction. Old watershed stroke in the left hemisphere. Chronic small vessel insults as described above. Electronically Signed   By: Nelson Chimes [name]   On: 12/10/2018 12:28   Ct Head Code Stroke Wo Contrast  Result Date: 12/08/2018 CLINICAL DATA:  Code stroke. Ataxia. Right-sided weakness. Aphasia. Last seen normal 1400 hours. EXAM: CT HEAD WITHOUT CONTRAST TECHNIQUE: Contiguous axial images were obtained from the base of the skull through the vertex without intravenous contrast. COMPARISON:  11/12/2018 FINDINGS: Brain: Chronic small-vessel changes affect the pons. Old cerebellar infarctions right more than left. Old cortical and subcortical infarction in the left cerebral hemisphere in a watershed distribution and affecting the left parietal brain. Chronic small-vessel changes affect the white matter. I think there is some acute or subacute extension of the infarction in the left parietal lobe posterior to the  region previously seen affected. No sign of mass lesion, hemorrhage, hydrocephalus or extra-axial collection. Vascular: There is atherosclerotic calcification of the major vessels at the base of the brain. Skull: Negative Sinuses/Orbits: Clear/normal Other: None ASPECTS (Concrete Stroke Program Early CT Score) - Ganglionic level infarction (caudate, lentiform nuclei, internal capsule, insula, M1-M3 cortex): 7 - Supraganglionic infarction (M4-M6 cortex): 2 Total score (0-10 with 10 being normal): 9, allowing for the chronic changes. IMPRESSION: 1. Acute or subacute extension of infarction in the left parietal lobe posterior to the region previously affected. No evidence of hemorrhage or mass effect. 2. ASPECTS is difficult to apply in the setting of the extensive old infarctions, but the acute changes affect 1 MCA sector. 3. These results were communicated to Dr. Cheral Marker at [place] 8/27/2020by text page via the [facility] messaging system. Electronically Signed   By: Nelson Chimes [name]   On: 12/08/2018 18:48      Subjective: Patient seen and examined.  Wife at the bedside.  No overnight events.  He had one episode of slight agitation and was given 2 mg of morphine in the morning, was also given a dose of Ativan in the afternoon.  He is not waking up.  Intermittently get agitated.   Discharge Exam: Vitals:   12/13/18 0852 12/13/18 0852  BP: (!) 164/76 (!) 164/76  Pulse: (!) 130 (!) 130  Resp: 20 20  Temp: (!) 101 F (38.3 C) (!) 101 F (38.3 C)  SpO2: 99% 99%   Vitals:   12/12/18 2337 12/13/18 0429 12/13/18 0852 12/13/18 0852  BP: 140/84 (!) 147/66 (!) 164/76 (!) 164/76  Pulse: (!) 108 (!) 111 (!) 130 (!) 130  Resp: 18 20 20 20   Temp: 98.9 F (37.2 C) 98.6 F (37 C) (!) 101 F (38.3 C) (!) 101 F (38.3 C)  TempSrc: Axillary Axillary Axillary Oral  SpO2: 98% 98% 99% 99%  Weight:      Height:        General: Pt is sleepy, lethargic, unable to interact .  Restless with no purposeful  movements and not following commands. Cardiovascular: RRR, S1/S2 +, no rubs, no gallops Respiratory: CTA bilaterally, no wheezing, no rhonchi Abdominal: Soft, NT, ND, bowel sounds + Extremities: no edema, no cyanosis    The results of significant diagnostics from this  hospitalization (including imaging, microbiology, ancillary and laboratory) are listed below for reference.     Microbiology: Recent Results (from the past 240 hour(s))  SARS Coronavirus 2 [facility] order, Performed in [facility] hospital lab) Nasopharyngeal Nasopharyngeal Swab     Status: None   Collection Time: 12/08/18  8:45 PM   Specimen: Nasopharyngeal Swab  Result Value Ref Range Status   SARS Coronavirus 2 NEGATIVE NEGATIVE Final    Comment: (NOTE) If result is NEGATIVE SARS-CoV-2 target nucleic acids are NOT DETECTED. The SARS-CoV-2 RNA is generally detectable in upper and lower  respiratory specimens during the acute phase of infection. The lowest  concentration of SARS-CoV-2 viral copies this assay can detect is 250  copies / mL. A negative result does not preclude SARS-CoV-2 infection  and should not be used as the sole basis for treatment or other  patient management decisions.  A negative result may occur with  improper specimen collection / handling, submission of specimen other  than nasopharyngeal swab, presence of viral mutation(s) within the  areas targeted by this assay, and inadequate number of viral copies  (<250 copies / mL). A negative result must be combined with clinical  observations, patient history, and epidemiological information. If result is POSITIVE SARS-CoV-2 target nucleic acids are DETECTED. The SARS-CoV-2 RNA is generally detectable in upper and lower  respiratory specimens dur ing the acute phase of infection.  Positive  results are indicative of active infection with SARS-CoV-2.  Clinical  correlation with patient history and other diagnostic information is  necessary to  determine patient infection status.  Positive results do  not rule out bacterial infection or co-infection with other viruses. If result is PRESUMPTIVE POSTIVE SARS-CoV-2 nucleic acids MAY BE PRESENT.   A presumptive positive result was obtained on the submitted specimen  and confirmed on repeat testing.  While 2019 novel coronavirus  (SARS-CoV-2) nucleic acids may be present in the submitted sample  additional confirmatory testing may be necessary for epidemiological  and / or clinical management purposes  to differentiate between  SARS-CoV-2 and other Sarbecovirus currently known to infect humans.  If clinically indicated additional testing with an alternate test  methodology [phone]) is advised. The SARS-CoV-2 RNA is generally  detectable in upper and lower respiratory sp ecimens during the acute  phase of infection. The expected result is Negative. Fact Sheet for Patients:  StrictlyIdeas.no Fact Sheet for Healthcare Providers: BankingDealers.co.za This test is not yet approved or cleared by the [place] FDA and has been authorized for detection and/or diagnosis of SARS-CoV-2 by FDA under an Emergency Use Authorization (EUA).  This EUA will remain in effect (meaning this test can be used) for the duration of the COVID-19 declaration under Section 564(b)(1) of the Act, 21 U.S.C. section 360bbb-3(b)(1), unless the authorization is terminated or revoked sooner. Performed at [facility], [place] [address], [place], [place] [address]      Labs: BNP (last 3 results) No results for input(s): BNP in the last 8760 hours. Basic Metabolic Panel: Recent Labs  Lab 12/08/18 1827 12/08/18 1855 12/09/18 0335 12/10/18 0627  NA 139 142 142 142  K 4.4 4.5 4.2 4.1  CL 108 108 112* 110  CO2 22  --  20* 23  GLUCOSE 277* 258* 146* 226*  BUN 29* 29* 23 24*  CREATININE 2.04* 1.90* 1.80* 1.71*  CALCIUM 9.0  --  8.8* 9.0  MG  --   --    --  1.4*  PHOS  --   --   --  2.6   Liver Function Tests: Recent Labs  Lab 12/08/18 1827 12/09/18 0335  AST 19 25  ALT 19 19  ALKPHOS 86 82  BILITOT 0.5 0.3  PROT 6.3* 5.8*  ALBUMIN 3.4* 3.2*   No results for input(s): LIPASE, AMYLASE in the last 168 hours. No results for input(s): AMMONIA in the last 168 hours. CBC: Recent Labs  Lab 12/08/18 1827 12/08/18 1855 12/10/18 0627  WBC 7.8  --  10.1  NEUTROABS 6.2  --  8.0*  HGB 9.4* 10.2* 8.9*  HCT 30.5* 30.0* 29.1*  MCV 96.8  --  96.0  PLT 236  --  217   Cardiac Enzymes: No results for input(s): CKTOTAL, CKMB, CKMBINDEX, TROPONINI in the last 168 hours. BNP: Invalid input(s): POCBNP CBG: Recent Labs  Lab 12/11/18 2348 12/12/18 0459 12/12/18 0756 12/12/18 1132 12/12/18 1554  GLUCAP 151* 171* 167* 100* 149*   D-Dimer No results for input(s): DDIMER in the last 72 hours. Hgb A1c No results for input(s): HGBA1C in the last 72 hours. Lipid Profile No results for input(s): CHOL, HDL, LDLCALC, TRIG, CHOLHDL, LDLDIRECT in the last 72 hours. Thyroid function studies No results for input(s): TSH, T4TOTAL, T3FREE, THYROIDAB in the last 72 hours.  Invalid input(s): FREET3 Anemia work up No results for input(s): VITAMINB12, FOLATE, FERRITIN, TIBC, IRON, RETICCTPCT in the last 72 hours. Urinalysis    Component Value Date/Time   COLORURINE STRAW (A) 12/09/2018 0109   APPEARANCEUR HAZY (A) 12/09/2018 0109   LABSPEC 1.039 (H) 12/09/2018 0109   PHURINE 5.0 12/09/2018 0109   GLUCOSEU NEGATIVE 12/09/2018 0109   HGBUR SMALL (A) 12/09/2018 0109   BILIRUBINUR NEGATIVE 12/09/2018 0109   KETONESUR NEGATIVE 12/09/2018 0109   PROTEINUR 30 (A) 12/09/2018 0109   NITRITE NEGATIVE 12/09/2018 0109   LEUKOCYTESUR LARGE (A) 12/09/2018 0109   Sepsis Labs Invalid input(s): PROCALCITONIN,  WBC,  [place] [facility] Recent Results (from the past 240 hour(s))  SARS Coronavirus 2 [facility] order, Performed in [facility] hospital  lab) Nasopharyngeal Nasopharyngeal Swab     Status: None   Collection Time: 12/08/18  8:45 PM   Specimen: Nasopharyngeal Swab  Result Value Ref Range Status   SARS Coronavirus 2 NEGATIVE NEGATIVE Final    Comment: (NOTE) If result is NEGATIVE SARS-CoV-2 target nucleic acids are NOT DETECTED. The SARS-CoV-2 RNA is generally detectable in upper and lower  respiratory specimens during the acute phase of infection. The lowest  concentration of SARS-CoV-2 viral copies this assay can detect is 250  copies / mL. A negative result does not preclude SARS-CoV-2 infection  and should not be used as the sole basis for treatment or other  patient management decisions.  A negative result may occur with  improper specimen collection / handling, submission of specimen other  than nasopharyngeal swab, presence of viral mutation(s) within the  areas targeted by this assay, and inadequate number of viral copies  (<250 copies / mL). A negative result must be combined with clinical  observations, patient history, and epidemiological information. If result is POSITIVE SARS-CoV-2 target nucleic acids are DETECTED. The SARS-CoV-2 RNA is generally detectable in upper and lower  respiratory specimens dur ing the acute phase of infection.  Positive  results are indicative of active infection with SARS-CoV-2.  Clinical  correlation with patient history and other diagnostic information is  necessary to determine patient infection status.  Positive results do  not rule out bacterial infection or co-infection with other viruses. If result is PRESUMPTIVE POSTIVE SARS-CoV-2 nucleic  acids MAY BE PRESENT.   A presumptive positive result was obtained on the submitted specimen  and confirmed on repeat testing.  While 2019 novel coronavirus  (SARS-CoV-2) nucleic acids may be present in the submitted sample  additional confirmatory testing may be necessary for epidemiological  and / or clinical management purposes  to  differentiate between  SARS-CoV-2 and other Sarbecovirus currently known to infect humans.  If clinically indicated additional testing with an alternate test  methodology [phone]) is advised. The SARS-CoV-2 RNA is generally  detectable in upper and lower respiratory sp ecimens during the acute  phase of infection. The expected result is Negative. Fact Sheet for Patients:  StrictlyIdeas.no Fact Sheet for Healthcare Providers: BankingDealers.co.za This test is not yet approved or cleared by the [place] FDA and has been authorized for detection and/or diagnosis of SARS-CoV-2 by FDA under an Emergency Use Authorization (EUA).  This EUA will remain in effect (meaning this test can be used) for the duration of the COVID-19 declaration under Section 564(b)(1) of the Act, 21 U.S.C. section 360bbb-3(b)(1), unless the authorization is terminated or revoked sooner. Performed at [facility], [place] [address], [place], [place] [address]      Time coordinating discharge:  32 minutes  SIGNED:   Barb Merino, MD  [facility] 12/13/2018, 1:37 PM

## 2018-12-13 NOTE — TOC Progression Note (Signed)
Transition of Care [facility]) - Progression Note    Patient Details  Name: Peter Nicholson MRN: [id number] Date of Birth: 1940-11-10  Transition of Care [facility]) CM/SW [place], [place] Phone Number: 12/13/2018, 3:56 PM  Clinical Narrative:   CSW met with patient's wife to discuss setting up transport, and she says she has talked with her daughters and they want to see about getting home home instead of facility. Wife said she didn't think it would be possible because he would need a hospital bed and they didn't have one of those, and CSW reassured her that we could get a hospital bed set up if they wanted him home. CSW made sure wife was aware that if family wanted to take him home, that the family would be responsible for most of the care; there would not be a nurse available all the time for end of life care. Wife said that her daughter who is pushing for him to go home is a CNA and she will be up here this evening, she's about an hour and a half away. CSW told wife to talk it over with her family to make a decision, and CSW would follow back up with her tomorrow. CSW informed MD of barrier to discharge, will hold on discharge for the day today for family to make a decision.    Expected Discharge Plan: [place] Barriers to Discharge: Barriers Resolved  Expected Discharge Plan and Services Expected Discharge Plan: [place] Choice: Hospice Living arrangements for the past 2 months: Single Family Home Expected Discharge Date: 12/13/18                                     Social Determinants of Health (SDOH) Interventions    Readmission Risk Interventions No flowsheet data found.

## 2018-12-13 NOTE — Progress Notes (Signed)
Nutrition Brief Note  Chart reviewed. Pt desires no artificiall feeding.  Pt has transitioned to full comfort care.  No further nutrition interventions warranted at this time.  Please re-consult as needed.   Corrin Parker, MS, RD, LDN Pager # [phone] After hours/ weekend pager # [phone]

## 2018-12-13 NOTE — Progress Notes (Signed)
Daily Progress Note   Patient Name: Peter Nicholson       Date: 12/13/2018 DOB: 1941/03/08  Age: 78 y.o. MRN#: [id number] Attending Physician: Barb Merino, MD Primary Care Physician: No primary care provider on file. Admit Date: 12/08/2018  Reason for Consultation/Follow-up: Establishing goals of care and Terminal Care  Subjective: Patient in bed sleeping comfortably, spouse at bedside. Spouse asking when patient will be transferred to hospice home.   Length of Stay: 4  Current Medications: Scheduled Meds:  . chlorhexidine  15 mL Mouth/Throat QID  . LORazepam  2 mg Intravenous Q6H  .  morphine injection  2 mg Intravenous Q4H    Continuous Infusions: . levETIRAcetam 500 mg (12/13/18 0931)    PRN Meds: acetaminophen **OR** acetaminophen (TYLENOL) oral liquid 160 mg/5 mL **OR** acetaminophen, glycopyrrolate **OR** glycopyrrolate **OR** glycopyrrolate, haloperidol lactate, morphine injection  Physical Exam          Vital Signs: BP (!) 164/76 (BP Location: Right Arm)   Pulse (!) 130   Temp (!) 101 F (38.3 C) (Oral)   Resp 20   Ht 5\' 6"  (1.676 m)   Wt 78 kg   SpO2 99%   BMI 27.75 kg/m  SpO2: SpO2: 99 % O2 Device: O2 Device: Room Air O2 Flow Rate: O2 Flow Rate (L/min): 2 L/min  Intake/output summary:   Intake/Output Summary (Last 24 hours) at 12/13/2018 1255 Last data filed at 12/13/2018 0601 Gross per 24 hour  Intake 510.9 ml  Output 150 ml  Net 360.9 ml   LBM: Last BM Date: 12/10/18 Baseline Weight: Weight: 75.3 kg Most recent weight: Weight: 78 kg       Palliative Assessment/Data: PPS: 10%      Patient Active Problem List   Diagnosis Date Noted  . Advanced care planning/counseling discussion   . Goals of care, counseling/discussion   . Palliative care by  specialist   . Acute ischemic stroke ([place]) 12/09/2018  . Ischemic stroke ([place]) 12/08/2018  . Insulin-requiring or dependent type II diabetes mellitus ([place]) 12/08/2018  . Essential hypertension 12/08/2018  . Vascular dementia ([place]) 12/08/2018  . Depression with anxiety 12/08/2018  . CKD (chronic kidney disease), stage III ([place]) 12/08/2018  . Acute renal failure superimposed on stage 3 chronic kidney disease ([place]) 12/08/2018  . Normocytic anemia 12/08/2018  . Seizure (  [place]) 12/08/2018  . Cerebrovascular accident (CVA) [facility])     Palliative Care Assessment & Plan   Patient Profile: 78 y.o. male  with past medical history of diabetes type 2, CKD stage III, dementia, depression, anxiety, and recent stroke admitted on 12/08/2018 with acute or subacute extension infarction in the left parietal lobe. He was witnessed to have multiple seizures in the ED. Since admission he has remained encephalopathic with intermittent agitation and somnolence. He pulled out his NG tube. Palliative medicine consulted for [name].     Assessment/Recommendations/Plan   Continue comfort and symptom management as ordered  Will followup with social work regarding residential hospice referral  Goals of Care and Additional Recommendations:  Limitations on Scope of Treatment: Full Comfort Care  Code Status:  DNR  Prognosis:   < 2 weeks  Discharge Planning:  Hospice facility  Care plan was discussed with patient's spouse- [name].  Thank you for allowing the Palliative Medicine Team to assist in the care of this patient.   Time In: 1205 Time Out: 1235 Total Time 35 mins Prolonged Time Billed no      Greater than 50%  of this time was spent counseling and coordinating care related to the above assessment and plan.  Mariana Kaufman, AGNP-C Palliative Medicine   Please contact Palliative Medicine Team phone at [phone] for questions and concerns.

## 2018-12-13 NOTE — TOC Initial Note (Signed)
Transition of Care [facility]) - Initial/Assessment Note    Patient Details  Name: Peter Nicholson MRN: [id number] Date of Birth: 1940/12/07  [facility]) CM/SW Contact:    Geralynn Ochs, LCSW Phone Number: 12/13/2018, 3:35 PM  Clinical Narrative:  CSW acknowledging consult for residential hospice to [facility]. CSW sent referral to Admissions this morning, and received a call back from Admissions that the daughter that they spoke with wasn't sure what the plan was. CSW spoke with wife, Peter Nicholson, in the room to confirm that the plan was for residential hospice. Peter Nicholson cried during conversation, saying that she knows he should be happy that he's lived a good, long life, but she's just not ready for him to go, even though she knows there isn't anything that can be done. CSW provided support.   Hospice Admissions spoke with wife in the room to confirm plan for transfer to hospice. Bed is available for today, CSW to follow to transition to hospice this afternoon.                  Expected Discharge Plan: [place] Barriers to Discharge: Barriers Resolved   Patient Goals and CMS Choice Patient states their goals for this hospitalization and ongoing recovery are:: patient's family wants him to be comfortable CMS Medicare.gov Compare Post Acute Care list provided to:: Patient Represenative (must comment) Choice offered to / list presented to : Spouse  Expected Discharge Plan and Services Expected Discharge Plan: [place]     Post Acute Care Choice: Hospice Living arrangements for the past 2 months: Single Family Home Expected Discharge Date: 12/13/18                                    Prior Living Arrangements/Services Living arrangements for the past 2 months: Single Family Home Lives with:: Spouse Patient language and need for interpreter reviewed:: No        Need for Family Participation in Patient Care: Yes (Comment) Care giver  support system in place?: No (comment)   Criminal Activity/Legal Involvement Pertinent to Current Situation/Hospitalization: No - Comment as needed  Activities of Daily Living      Permission Sought/Granted                  Emotional Assessment Appearance:: Appears stated age Attitude/Demeanor/Rapport: Unable to Assess Affect (typically observed): Unable to Assess   Alcohol / Substance Use: Not Applicable Psych Involvement: No (comment)  Admission diagnosis:  Cerebrovascular accident (CVA), unspecified mechanism ([place]) [I63.9] Ischemic stroke [facility]) [I63.9] Patient Active Problem List   Diagnosis Date Noted  . Terminal care   . Advanced care planning/counseling discussion   . Goals of care, counseling/discussion   . Palliative care by specialist   . Acute ischemic stroke ([place]) 12/09/2018  . Ischemic stroke ([place]) 12/08/2018  . Insulin-requiring or dependent type II diabetes mellitus ([place]) 12/08/2018  . Essential hypertension 12/08/2018  . Vascular dementia ([place]) 12/08/2018  . Depression with anxiety 12/08/2018  . CKD (chronic kidney disease), stage III ([place]) 12/08/2018  . Acute renal failure superimposed on stage 3 chronic kidney disease ([place]) 12/08/2018  . Normocytic anemia 12/08/2018  . Seizure ([place]) 12/08/2018  . Cerebrovascular accident (CVA) [facility])    PCP:  No primary care provider on file. Pharmacy:   [id number] [place], [place] - [address] [place] [address] Phone:  [phone] Fax: [phone]     Social Determinants of Health (SDOH) Interventions    Readmission Risk Interventions No flowsheet data found.

## 2018-12-14 MED ORDER — LORAZEPAM 2 MG/ML PO CONC
0.5000 mg | ORAL | Status: DC | PRN
  Administered 2018-12-14: 0.5 mg via ORAL
  Filled 2018-12-14: qty 1

## 2018-12-14 MED ORDER — MORPHINE SULFATE (CONCENTRATE) 10 MG/0.5ML PO SOLN
10.0000 mg | ORAL | Status: DC | PRN
  Administered 2018-12-15 (×2): 10 mg via ORAL
  Filled 2018-12-14 (×3): qty 0.5

## 2018-12-14 MED ORDER — LORAZEPAM 2 MG/ML PO CONC: 1.0000 mg | ORAL | 0 refills | Status: AC | PRN

## 2018-12-14 MED ORDER — ACETAMINOPHEN 650 MG RE SUPP
325.0000 mg | Freq: Once | RECTAL | Status: AC
  Administered 2018-12-14: 02:00:00 325 mg via RECTAL
  Filled 2018-12-14: qty 1

## 2018-12-14 MED ORDER — MORPHINE SULFATE (CONCENTRATE) 10 MG/0.5ML PO SOLN: 10.0000 mg | ORAL | 0 refills | Status: AC | PRN

## 2018-12-14 MED FILL — MORPHINE SULF 100 MG/5 ML S: 100 | 7 days supply | Qty: 30 | Fill #0

## 2018-12-14 MED FILL — LORAZEPAM INTENSOL 2 MG/ML: 2 | 10 days supply | Qty: 30 | Fill #0

## 2018-12-14 NOTE — TOC Transition Note (Signed)
Transition of Care [facility]) - CM/SW Discharge Note   Patient Details  Name: Peter Nicholson MRN: [id number] Date of Birth: 12/17/1940  Transition [facility] Care [facility]) CM/SW Contact:  Geralynn Ochs, LCSW Phone Number: 12/14/2018, 3:55 PM   Clinical Narrative:   CSW following for discharge plan. CSW spoke with Cheri with [place] this morning, that the family was still deciding on what they wanted. Wife wants the patient in hospice home, but the daughters are pushing for the patient to come home. Wife asked for some time to decide.  CSW later contacted by Leonard Downing with Palliative Medicine Team that wife has agreed to daughter's decision on bringing the patient home, although the wife is concerned about it but doesn't want to push back against the daughters. Hospice is ordering equipment, and equipment should be delivered to the home sometime between 4 and 8 this evening. If equipment is delivered early enough, patient will transition home; if equipment is delivered too late, then patient will not transition until first thing tomorrow morning. CSW has placed transport forms at the desk and hospice will contact the nurse if patient is to be transferred tonight.     Final next level of care: Home w Hospice Care Barriers to Discharge: Barriers Resolved   Patient Goals and CMS Choice Patient states their goals for this hospitalization and ongoing recovery are:: patient's family wants him to be comfortable CMS Medicare.gov Compare Post Acute Care list provided to:: Patient Represenative (must comment) Choice offered to / list presented to : Spouse  Discharge Placement                Patient to be transferred to facility by: [place] Name of family member notified: Charlett Nose Patient and family notified of of transfer: 12/14/18  Discharge Plan and Services     Post Acute Care Choice: Hospice            DME Agency: [name]         [facility] Agency: [facility] Determinants of Health (SDOH) Interventions     Readmission Risk Interventions No flowsheet data found.

## 2018-12-14 NOTE — Progress Notes (Signed)
PROGRESS NOTE    Peter Nicholson  [id number] DOB: 1940/05/21 DOA: 12/08/2018 PCP: No primary care provider on file.    Brief Narrative:  Admitted with recurrent stroke.   Assessment & Plan:   Principal Problem:   Ischemic stroke ([place]) Active Problems:   Insulin-requiring or dependent type II diabetes mellitus ([place])   Essential hypertension   Vascular dementia ([place])   Depression with anxiety   Acute renal failure superimposed on stage 3 chronic kidney disease (HCC)   Normocytic anemia   Seizure ([place])   Acute ischemic stroke [facility])   Advanced care planning/counseling discussion   Goals of care, counseling/discussion   Palliative care by specialist   Terminal care  78 year old male with history of hypertension, insulin-dependent type 2 diabetes, CKD stage III, dementia, depression, anxiety and recent stroke on 5/20 presented to the emergency department for evaluation of worsening right-sided weakness, difficult to talk. Has history of dementia. Wife noted acute onset aphasia and increasing difficulty with walking. No seizures at home. Upon arrival to the emergency room, patient was afebrile, on room air. Slight renal insufficiency. Stable normocytic anemia. CT head showed acute or subacute extension of infarction of left parietal lobe. No emergent large vessel occlusion. While in the emergency room, he had 2 witnessed generalized tonic-clonic seizure. Remains encephalopathic since then.  Acute ischemic stroke with right-sided weakness and aphasia and persistent encephalopathy Generalized tonic-clonic seizure with prolonged postictal state/delirium and agitation Acute kidney injury Dementia  Given overall poor prognosis and outcome, poor neurological outcome, unable to eat and no desire for artificial feeding, patient was evaluated by palliative care and hospice and family agreed on hospice level of care for this patient. Patient does have short lifespan with no longer  eating or drinking, persistent encephalopathy and is expected to die soon.  Currently with comfort care measures. Family deciding whether to go home with to go to hospice home. Will prefer patient to go to hospice home because of total care.   DVT prophylaxis: SCDs Code Status: DNR Family Communication: Wife Disposition Plan: Hospice, home versus facility.  Pending final decision.   Consultants:   Palliative medicine  Neurology  Procedures:   EEG  Antimicrobials:   None   Subjective: Patient seen and examined.  Could not be discharged yesterday as family changed their mind and planning to take patient home if their daughters can be helpful.  Has temperature overnight. Patient is comfort care, all comfort medications available.  Remains quite altered and unresponsive.  Objective: Vitals:   12/13/18 2036 12/13/18 2130 12/14/18 0342 12/14/18 0804  BP: (!) 143/62  135/80 139/77  Pulse: 100  84 98  Resp: (!) 22  18 20   Temp: (!) 103 F (39.4 C) (!) 102.2 F (39 C) (!) 101 F (38.3 C) (!) 103.7 F (39.8 C)  TempSrc:  Oral Oral Axillary  SpO2: 94%  (!) 81% 91%  Weight:      Height:        Intake/Output Summary (Last 24 hours) at 12/14/2018 1046 Last data filed at 12/13/2018 1753 Gross per 24 hour  Intake -  Output 625 ml  Net -625 ml   Filed Weights   12/08/18 2030 12/10/18 0333 12/11/18 0400  Weight: 75.3 kg 76 kg 78 kg    Examination:  Physical Exam  Constitutional: No distress.  Stuporous, minimally responsive, not in any distress  HENT:  Head: Normocephalic and atraumatic.  Neck: Normal range of motion. Neck supple.  Cardiovascular: Normal rate and regular rhythm.  Tachycardic  Pulmonary/Chest: No respiratory distress. He has no wheezes. He has no rales.      Data Reviewed: I have personally reviewed following labs and imaging studies  CBC: Recent Labs  Lab 12/08/18 1827 12/08/18 1855 12/10/18 0627  WBC 7.8  --  10.1  NEUTROABS 6.2  --   8.0*  HGB 9.4* 10.2* 8.9*  HCT 30.5* 30.0* 29.1*  MCV 96.8  --  96.0  PLT 236  --  [id number]   Basic Metabolic Panel: Recent Labs  Lab 12/08/18 1827 12/08/18 1855 12/09/18 0335 12/10/18 0627  NA 139 142 142 142  K 4.4 4.5 4.2 4.1  CL 108 108 112* 110  CO2 22  --  20* 23  GLUCOSE 277* 258* 146* 226*  BUN 29* 29* 23 24*  CREATININE 2.04* 1.90* 1.80* 1.71*  CALCIUM 9.0  --  8.8* 9.0  MG  --   --   --  1.4*  PHOS  --   --   --  2.6   GFR: Estimated Creatinine Clearance: 34.4 mL/min (A) (by C-G formula based on SCr of 1.71 mg/dL (H)). Liver Function Tests: Recent Labs  Lab 12/08/18 1827 12/09/18 0335  AST 19 25  ALT 19 19  ALKPHOS 86 82  BILITOT 0.5 0.3  PROT 6.3* 5.8*  ALBUMIN 3.4* 3.2*   No results for input(s): LIPASE, AMYLASE in the last 168 hours. No results for input(s): AMMONIA in the last 168 hours. Coagulation Profile: Recent Labs  Lab 12/08/18 1827  INR 1.0   Cardiac Enzymes: No results for input(s): CKTOTAL, CKMB, CKMBINDEX, TROPONINI in the last 168 hours. BNP (last 3 results) No results for input(s): PROBNP in the last 8760 hours. HbA1C: No results for input(s): HGBA1C in the last 72 hours. CBG: Recent Labs  Lab 12/11/18 2348 12/12/18 0459 12/12/18 0756 12/12/18 1132 12/12/18 1554  GLUCAP 151* 171* 167* 100* 149*   Lipid Profile: No results for input(s): CHOL, HDL, LDLCALC, TRIG, CHOLHDL, LDLDIRECT in the last 72 hours. Thyroid Function Tests: No results for input(s): TSH, T4TOTAL, FREET4, T3FREE, THYROIDAB in the last 72 hours. Anemia Panel: No results for input(s): VITAMINB12, FOLATE, FERRITIN, TIBC, IRON, RETICCTPCT in the last 72 hours. Sepsis Labs: No results for input(s): PROCALCITON, LATICACIDVEN in the last 168 hours.  Recent Results (from the past 240 hour(s))  SARS Coronavirus 2 [facility] order, Performed in [facility] hospital lab) Nasopharyngeal Nasopharyngeal Swab     Status: None   Collection Time: 12/08/18  8:45 PM    Specimen: Nasopharyngeal Swab  Result Value Ref Range Status   SARS Coronavirus 2 NEGATIVE NEGATIVE Final    Comment: (NOTE) If result is NEGATIVE SARS-CoV-2 target nucleic acids are NOT DETECTED. The SARS-CoV-2 RNA is generally detectable in upper and lower  respiratory specimens during the acute phase of infection. The lowest  concentration of SARS-CoV-2 viral copies this assay can detect is 250  copies / mL. A negative result does not preclude SARS-CoV-2 infection  and should not be used as the sole basis for treatment or other  patient management decisions.  A negative result may occur with  improper specimen collection / handling, submission of specimen other  than nasopharyngeal swab, presence of viral mutation(s) within the  areas targeted by this assay, and inadequate number of viral copies  (<250 copies / mL). A negative result must be combined with clinical  observations, patient history, and epidemiological information. If result is POSITIVE SARS-CoV-2 target nucleic acids are DETECTED. The SARS-CoV-2 RNA  is generally detectable in upper and lower  respiratory specimens dur ing the acute phase of infection.  Positive  results are indicative of active infection with SARS-CoV-2.  Clinical  correlation with patient history and other diagnostic information is  necessary to determine patient infection status.  Positive results do  not rule out bacterial infection or co-infection with other viruses. If result is PRESUMPTIVE POSTIVE SARS-CoV-2 nucleic acids MAY BE PRESENT.   A presumptive positive result was obtained on the submitted specimen  and confirmed on repeat testing.  While 2019 novel coronavirus  (SARS-CoV-2) nucleic acids may be present in the submitted sample  additional confirmatory testing may be necessary for epidemiological  and / or clinical management purposes  to differentiate between  SARS-CoV-2 and other Sarbecovirus currently known to infect humans.  If  clinically indicated additional testing with an alternate test  methodology [phone]) is advised. The SARS-CoV-2 RNA is generally  detectable in upper and lower respiratory sp ecimens during the acute  phase of infection. The expected result is Negative. Fact Sheet for Patients:  StrictlyIdeas.no Fact Sheet for Healthcare Providers: BankingDealers.co.za This test is not yet approved or cleared by the [place] FDA and has been authorized for detection and/or diagnosis of SARS-CoV-2 by FDA under an Emergency Use Authorization (EUA).  This EUA will remain in effect (meaning this test can be used) for the duration of the COVID-19 declaration under Section 564(b)(1) of the Act, 21 U.S.C. section 360bbb-3(b)(1), unless the authorization is terminated or revoked sooner. Performed at [facility], [place] [address] [place], [place] [address]          Radiology Studies: No results found.      Scheduled Meds: . chlorhexidine  15 mL Mouth/Throat QID  . LORazepam  2 mg Intravenous Q6H  .  morphine injection  2 mg Intravenous Q4H   Continuous Infusions: . levETIRAcetam 500 mg (12/14/18 1028)     LOS: 5 days    Time spent: 25 minutes    Barb Merino, MD [facility] Pager [phone]  If 7PM-7AM, please contact night-coverage [email or web address] Password [facility] 12/14/2018, 10:46 AM

## 2018-12-14 NOTE — Progress Notes (Signed)
Palliative:   HPI: 78 y.o.malewith past medical history of diabetes type 2, CKD stage III, dementia, depression, anxiety, and recent strokeadmitted on8/27/2020with acute or subacute extension infarction in the left parietal lobe. He was witnessed to have multiple seizures in the ED. Since admission he has remained encephalopathic with intermittent agitation and somnolence. He pulled out his NG tube. Palliative medicine consulted for [place].Patient transitioned to full comfort care. Referral made to [facility] for residential Hospice Placement, however, family later discussed among themselves and decided they preferred to take patient home.   Subjective: Spoke with Charlett Nose. Although she would prefer to place patient in Hospice facility, her daughter Santiago Glad is pushing to bring patient home to die. Charlett Nose is pushing off making a decision. I explained to Charlett Nose that if she doesn't want patient to die in hospital patient likely needs to d/c to home or residential hospice today as he appears to be declining. I discussed with her that I feared he would decline to a point that he would not be safe for transfer. Charlett Nose expressed concerns about the environment at home. I offered to discuss her concerns with her daughter and her desire to place patient at residential Hospice. Also discussed that Charlett Nose is primary Media planner. At end of discussion decision was made by Charlett Nose to bring patient home.   Objective: Patient in bed, RR up to 40. Feet cool to touch. Noted fevers last night- likely terminal fevers. Tachycardic. No audible secretions. Responds to painful stimuli.   Assesment/Plan:   1. Dying s/p CVA extension with encephalopathy transitioned to comfort measures only- will need to transition IV medications to oral for discharge recommend:    Concentrated Morphine 10mg /.78mL - Give .31mL q4hr sublingual and give .21mL sublingual q30 min as needed for increased RR or signs of agitation or  discomfort   Lorazepam solution 2mg /mL - Give 61mL q4hr sublingual for seizure prophylaxis and agitation  2. Disposition- contacted Cheri, RN with [facility] and [facility], [place] to assist with arranging d/c home.  Mariana Kaufman, AGNP-C Palliative Medicine  Please call Palliative Medicine team phone with any questions [phone]. For individual providers please see AMION.  Time in: 1100 Time out: 1135 Total time: 35 mins  Greater than 50%  of this time was spent counseling and coordinating care related to the above assessment and plan

## 2018-12-14 NOTE — Progress Notes (Signed)
Patient has a fever of 103.2 F sponged down, tylenol given and came down to 102 then  101.1. F MD made aware as mew score was as high as 4  New orders given.  Baths given  Pt reposition and made comfortable.. RN will continue to monitor pt. Pt is a DNR and may be d/c home with hospice per orders.

## 2018-12-14 NOTE — Care Management Important Message (Signed)
Important Message  Patient Details  Name: Peter Nicholson MRN: [id number] Date of Birth: 1941/01/31   Medicare Important Message Given:  Yes     [name] 12/14/2018, 1:47 PM

## 2018-12-14 NOTE — Progress Notes (Signed)
[facility] to [place] this am at home over phone.She confirms that she needs a hospital bed. Long discussion about poor prognosis and that she will need help due to pt getting medications ATC. She is unsure she can manage him at home but states that her daughter wants to bring him home. She will discuss with her daughter again this am and let me know. She is to call me  when they decide where he is going either to [facility] or home. I will order equipment as soon as they decide. Webb Silversmith RN [phone]

## 2018-12-14 NOTE — Discharge Summary (Signed)
Physician Discharge Summary  Peter Nicholson [id number] DOB: [birth year] DOA: 12/08/2018  PCP: No primary care provider on file.  Admit date: 12/08/2018 Discharge date: 12/14/2018  Admitted From: home  Disposition:  Home with hospice    Home Health: Not applicable Equipment/Devices: Hospice to provide   Discharge Condition: Serious, terminal CODE STATUS: DNR Diet recommendation: Comfort food  Discharge Summary: 78 year old male with history of hypertension, insulin-dependent type 2 diabetes, CKD stage III, dementia, depression, anxiety and recent stroke on 5/20 presented to the emergency department for evaluation of worsening right-sided weakness, difficult to talk. Has history of dementia. Wife noted acute onset aphasia and increasing difficulty with walking. No seizures at home. Upon arrival to the emergency room, patient was afebrile, on room air. Slight renal insufficiency. Stable normocytic anemia. CT head showed acute or subacute extension of infarction of left parietal lobe. No emergent large vessel occlusion. While in the emergency room, he had 2 witnessed generalized tonic-clonic seizure. Remains encephalopathic since then.  Acute ischemic stroke with right-sided weakness and aphasia and persistent encephalopathy Generalized tonic-clonic seizure with prolonged postictal state/delirium and agitation Acute kidney injury Dementia  Given overall poor prognosis and outcome, poor neurological outcome, unable to eat and no desire for artificial feeding, patient was evaluated by palliative care and hospice and family agreed on hospice level of care for this patient. Patient does have short lifespan with no longer eating or drinking, persistent encephalopathy and is expected to die soon.  Initially evaluated for transfer to inpatient hospice, however family arranged him to go home with hospice care at home.   Discharge Diagnoses:  Principal Problem:   Ischemic stroke  [facility]) Active Problems:   Insulin-requiring or dependent type II diabetes mellitus ([place])   Essential hypertension   Vascular dementia ([place])   Depression with anxiety   Acute renal failure superimposed on stage 3 chronic kidney disease (HCC)   Normocytic anemia   Seizure ([place])   Acute ischemic stroke [facility])   Advanced care planning/counseling discussion   Goals of care, counseling/discussion   Palliative care by specialist   Terminal care    Discharge Instructions   Allergies as of 12/14/2018   No Known Allergies     Medication List    STOP taking these medications   amLODipine 5 MG tablet Commonly known as: NORVASC   clopidogrel 75 MG tablet Commonly known as: PLAVIX   donepezil 10 MG tablet Commonly known as: ARICEPT   glipiZIDE 5 MG 24 hr tablet Commonly known as: GLUCOTROL XL   insulin glargine 100 UNIT/ML injection Commonly known as: LANTUS   memantine 10 MG tablet Commonly known as: NAMENDA   metFORMIN 500 MG tablet Commonly known as: GLUCOPHAGE   pantoprazole 40 MG tablet Commonly known as: PROTONIX     TAKE these medications   LORazepam 2 MG/ML concentrated solution Commonly known as: ATIVAN Take 0.5 mLs (1 mg total) by mouth every 4 (four) hours as needed for anxiety, seizure or sedation.   morphine CONCENTRATE 10 MG/0.5ML Soln concentrated solution Place 0.5 mLs (10 mg total) under the tongue every 3 (three) hours as needed for moderate pain, severe pain or anxiety.   sertraline 50 MG tablet Commonly known as: ZOLOFT Take 50 mg by mouth daily.       No Known Allergies  Consultations:  Neurology  Palliative care medicine   Procedures/Studies: Ct Angio Head W Or Wo Contrast  Result Date: 12/08/2018 CLINICAL DATA:  Right-sided weakness and aphasia EXAM: CT ANGIOGRAPHY HEAD AND NECK  TECHNIQUE: Multidetector CT imaging of the head and neck was performed using the standard protocol during bolus administration of intravenous contrast.  Multiplanar CT image reconstructions and MIPs were obtained to evaluate the vascular anatomy. Carotid stenosis measurements (when applicable) are obtained utilizing NASCET criteria, using the distal internal carotid diameter as the denominator. CONTRAST:  55mL OMNIPAQUE IOHEXOL 350 MG/ML SOLN COMPARISON:  CTA head neck 09/02/2018 FINDINGS: CTA NECK FINDINGS SKELETON: There is no bony spinal canal stenosis. No lytic or blastic lesion. OTHER NECK: Normal pharynx, larynx and major salivary glands. No cervical lymphadenopathy. Unremarkable thyroid gland. UPPER CHEST: No pneumothorax or pleural effusion. No nodules or masses. AORTIC ARCH: There is mild calcific atherosclerosis of the aortic arch. There is no aneurysm, dissection or hemodynamically significant stenosis of the visualized ascending aorta and aortic arch. Conventional 3 vessel aortic branching pattern. The visualized proximal subclavian arteries are widely patent. RIGHT CAROTID SYSTEM: --Common carotid artery: Widely patent origin without common carotid artery dissection or aneurysm. --Internal carotid artery: No dissection, occlusion or aneurysm. Mild atherosclerotic calcification at the carotid bifurcation without hemodynamically significant stenosis. --External carotid artery: No acute abnormality. LEFT CAROTID SYSTEM: --Common carotid artery: Widely patent origin without common carotid artery dissection or aneurysm. Mixed calcified and non-calcified atherosclerotic disease at the bifurcation, extending into the internal carotid artery, resulting in less than 50% stenosis. --Internal carotid artery: There is severe long segment narrowing the left internal carotid artery with an angiographic string sign. However, this is unchanged compared to 09/02/2018. The ICA is patent to the skull base. There is near total loss of enhancement at the petrous segment. --External carotid artery: No acute abnormality. VERTEBRAL ARTERIES: Left dominant configuration. Both  origins are clearly patent. The right vertebral artery is occluded at the V3 V4 junction, unchanged. CTA HEAD FINDINGS POSTERIOR CIRCULATION: --Vertebral arteries: The right V4 segment is occluded. There is severe stenosis of the left V4 segment secondary to atherosclerotic calcification. --Posterior inferior cerebellar arteries (PICA): Both are patent --Anterior inferior cerebellar arteries (AICA): Patent origins from the basilar artery. --Basilar artery: Normal. --Superior cerebellar arteries: Normal. --Posterior cerebral arteries: The right PCA is diminutive, unchanged. Left PCA is normal. ANTERIOR CIRCULATION: --Intracranial internal carotid arteries: The left internal carotid artery is occluded over a short segment at the distal petrous and cavernous segments. The carotid terminus is patent but severely narrowed, unchanged. --Anterior cerebral arteries (ACA): Non opacification of the left A1 segment, unchanged. Both distal anterior cerebral arteries are patent. The anterior communicating arteries patent. --Middle cerebral arteries (MCA): The right MCA is normal. Unchanged attenuated appearance of the left MCA without occlusion. VENOUS SINUSES: As permitted by contrast timing, patent. ANATOMIC VARIANTS: None Review of the MIP images confirms the above findings. IMPRESSION: 1. No emergent large vessel occlusion. 2. Unchanged severe long segment stenosis of the left internal carotid artery with areas of complete loss of enhancement at the skull base. 3. Unchanged occlusion of the right vertebral artery V4 segment and severe stenosis of the left V4 segment. 4. Unchanged narrowed left middle cerebral artery without occlusion. 5.  Aortic atherosclerosis (ICD10-I70.0). 6. These results were called by telephone at the time of interpretation on 12/08/2018 at 7:35 pm to Dr. Kerney Elbe , who verbally acknowledged these results. Electronically Signed   By: Ulyses Jarred [name]   On: 12/08/2018 19:51   Ct Angio Neck W Or  Wo Contrast  Result Date: 12/08/2018 CLINICAL DATA:  Right-sided weakness and aphasia EXAM: CT ANGIOGRAPHY HEAD AND NECK TECHNIQUE: Multidetector CT imaging of  the head and neck was performed using the standard protocol during bolus administration of intravenous contrast. Multiplanar CT image reconstructions and MIPs were obtained to evaluate the vascular anatomy. Carotid stenosis measurements (when applicable) are obtained utilizing NASCET criteria, using the distal internal carotid diameter as the denominator. CONTRAST:  25mL OMNIPAQUE IOHEXOL 350 MG/ML SOLN COMPARISON:  CTA head neck 09/02/2018 FINDINGS: CTA NECK FINDINGS SKELETON: There is no bony spinal canal stenosis. No lytic or blastic lesion. OTHER NECK: Normal pharynx, larynx and major salivary glands. No cervical lymphadenopathy. Unremarkable thyroid gland. UPPER CHEST: No pneumothorax or pleural effusion. No nodules or masses. AORTIC ARCH: There is mild calcific atherosclerosis of the aortic arch. There is no aneurysm, dissection or hemodynamically significant stenosis of the visualized ascending aorta and aortic arch. Conventional 3 vessel aortic branching pattern. The visualized proximal subclavian arteries are widely patent. RIGHT CAROTID SYSTEM: --Common carotid artery: Widely patent origin without common carotid artery dissection or aneurysm. --Internal carotid artery: No dissection, occlusion or aneurysm. Mild atherosclerotic calcification at the carotid bifurcation without hemodynamically significant stenosis. --External carotid artery: No acute abnormality. LEFT CAROTID SYSTEM: --Common carotid artery: Widely patent origin without common carotid artery dissection or aneurysm. Mixed calcified and non-calcified atherosclerotic disease at the bifurcation, extending into the internal carotid artery, resulting in less than 50% stenosis. --Internal carotid artery: There is severe long segment narrowing the left internal carotid artery with an  angiographic string sign. However, this is unchanged compared to 09/02/2018. The ICA is patent to the skull base. There is near total loss of enhancement at the petrous segment. --External carotid artery: No acute abnormality. VERTEBRAL ARTERIES: Left dominant configuration. Both origins are clearly patent. The right vertebral artery is occluded at the V3 V4 junction, unchanged. CTA HEAD FINDINGS POSTERIOR CIRCULATION: --Vertebral arteries: The right V4 segment is occluded. There is severe stenosis of the left V4 segment secondary to atherosclerotic calcification. --Posterior inferior cerebellar arteries (PICA): Both are patent --Anterior inferior cerebellar arteries (AICA): Patent origins from the basilar artery. --Basilar artery: Normal. --Superior cerebellar arteries: Normal. --Posterior cerebral arteries: The right PCA is diminutive, unchanged. Left PCA is normal. ANTERIOR CIRCULATION: --Intracranial internal carotid arteries: The left internal carotid artery is occluded over a short segment at the distal petrous and cavernous segments. The carotid terminus is patent but severely narrowed, unchanged. --Anterior cerebral arteries (ACA): Non opacification of the left A1 segment, unchanged. Both distal anterior cerebral arteries are patent. The anterior communicating arteries patent. --Middle cerebral arteries (MCA): The right MCA is normal. Unchanged attenuated appearance of the left MCA without occlusion. VENOUS SINUSES: As permitted by contrast timing, patent. ANATOMIC VARIANTS: None Review of the MIP images confirms the above findings. IMPRESSION: 1. No emergent large vessel occlusion. 2. Unchanged severe long segment stenosis of the left internal carotid artery with areas of complete loss of enhancement at the skull base. 3. Unchanged occlusion of the right vertebral artery V4 segment and severe stenosis of the left V4 segment. 4. Unchanged narrowed left middle cerebral artery without occlusion. 5.  Aortic  atherosclerosis (ICD10-I70.0). 6. These results were called by telephone at the time of interpretation on 12/08/2018 at 7:35 pm to Dr. Kerney Elbe , who verbally acknowledged these results. Electronically Signed   By: Ulyses Jarred [name]   On: 12/08/2018 19:51   Mr Brain Wo Contrast  Result Date: 12/10/2018 CLINICAL DATA:  Follow-up stroke.  Right-sided weakness and aphasia. EXAM: MRI HEAD WITHOUT CONTRAST TECHNIQUE: Multiplanar, multiecho pulse sequences of the brain and surrounding structures were  obtained without intravenous contrast. COMPARISON:  CT studies 12/08/2018 FINDINGS: Brain: Today's exam suffers from considerable motion degradation. No acute infarction is seen affecting the brainstem, cerebellum or right cerebral hemisphere. MRI confirms the acute extension of infarction in the right parietal lobe adjacent to an old cortical and subcortical infarction. No blood products are detected. No mass effect. Old small vessel infarctions are seen throughout the cerebellum. Old watershed distribution infarction in the left hemisphere affecting the cortical and subcortical brain in the deep white matter. No evidence of mass, hydrocephalus or extra-axial collection. Vascular: Major vessels at the base of the brain show flow. Skull and upper cervical spine: Negative Sinuses/Orbits: Clear/normal Other: None IMPRESSION: Acute infarction in the left parietal cortical and subcortical brain adjacent to an area of old infarction. Old watershed stroke in the left hemisphere. Chronic small vessel insults as described above. Electronically Signed   By: Nelson Chimes [name]   On: 12/10/2018 12:28   Ct Head Code Stroke Wo Contrast  Result Date: 12/08/2018 CLINICAL DATA:  Code stroke. Ataxia. Right-sided weakness. Aphasia. Last seen normal 1400 hours. EXAM: CT HEAD WITHOUT CONTRAST TECHNIQUE: Contiguous axial images were obtained from the base of the skull through the vertex without intravenous contrast. COMPARISON:   11/12/2018 FINDINGS: Brain: Chronic small-vessel changes affect the pons. Old cerebellar infarctions right more than left. Old cortical and subcortical infarction in the left cerebral hemisphere in a watershed distribution and affecting the left parietal brain. Chronic small-vessel changes affect the white matter. I think there is some acute or subacute extension of the infarction in the left parietal lobe posterior to the region previously seen affected. No sign of mass lesion, hemorrhage, hydrocephalus or extra-axial collection. Vascular: There is atherosclerotic calcification of the major vessels at the base of the brain. Skull: Negative Sinuses/Orbits: Clear/normal Other: None ASPECTS ([place] Stroke Program Early CT Score) - Ganglionic level infarction (caudate, lentiform nuclei, internal capsule, insula, M1-M3 cortex): 7 - Supraganglionic infarction (M4-M6 cortex): 2 Total score (0-10 with 10 being normal): 9, allowing for the chronic changes. IMPRESSION: 1. Acute or subacute extension of infarction in the left parietal lobe posterior to the region previously affected. No evidence of hemorrhage or mass effect. 2. ASPECTS is difficult to apply in the setting of the extensive old infarctions, but the acute changes affect 1 MCA sector. 3. These results were communicated to Dr. Cheral Marker at [place] 8/27/2020by text page via the [facility] messaging system. Electronically Signed   By: Nelson Chimes [name]   On: 12/08/2018 18:48     Subjective: Patient seen and examined.  Remains febrile overnight.  Unresponsive.   Discharge Exam: Vitals:   12/14/18 0804 12/14/18 1137  BP: 139/77   Pulse: 98   Resp: 20   Temp: (!) 103.7 F (39.8 C) (!) 103.4 F (39.7 C)  SpO2: 91%    Vitals:   12/13/18 2130 12/14/18 0342 12/14/18 0804 12/14/18 1137  BP:  135/80 139/77   Pulse:  84 98   Resp:  18 20   Temp: (!) 102.2 F (39 C) (!) 101 F (38.3 C) (!) 103.7 F (39.8 C) (!) 103.4 F (39.7 C)  TempSrc: Oral Oral  Axillary Axillary  SpO2:  (!) 81% 91%   Weight:      Height:        General: Pt is sleepy, lethargic, unable to interact .  Cardiovascular: RRR, S1/S2 +, no rubs, no gallops Respiratory: CTA bilaterally, no wheezing, no rhonchi Abdominal: Soft, NT, ND, bowel sounds +  Extremities: no edema, no cyanosis    The results of significant diagnostics from this hospitalization (including imaging, microbiology, ancillary and laboratory) are listed below for reference.     Microbiology: Recent Results (from the past 240 hour(s))  SARS Coronavirus 2 [facility] order, Performed in [facility] hospital lab) Nasopharyngeal Nasopharyngeal Swab     Status: None   Collection Time: 12/08/18  8:45 PM   Specimen: Nasopharyngeal Swab  Result Value Ref Range Status   SARS Coronavirus 2 NEGATIVE NEGATIVE Final    Comment: (NOTE) If result is NEGATIVE SARS-CoV-2 target nucleic acids are NOT DETECTED. The SARS-CoV-2 RNA is generally detectable in upper and lower  respiratory specimens during the acute phase of infection. The lowest  concentration of SARS-CoV-2 viral copies this assay can detect is 250  copies / mL. A negative result does not preclude SARS-CoV-2 infection  and should not be used as the sole basis for treatment or other  patient management decisions.  A negative result may occur with  improper specimen collection / handling, submission of specimen other  than nasopharyngeal swab, presence of viral mutation(s) within the  areas targeted by this assay, and inadequate number of viral copies  (<250 copies / mL). A negative result must be combined with clinical  observations, patient history, and epidemiological information. If result is POSITIVE SARS-CoV-2 target nucleic acids are DETECTED. The SARS-CoV-2 RNA is generally detectable in upper and lower  respiratory specimens dur ing the acute phase of infection.  Positive  results are indicative of active infection with SARS-CoV-2.   Clinical  correlation with patient history and other diagnostic information is  necessary to determine patient infection status.  Positive results do  not rule out bacterial infection or co-infection with other viruses. If result is PRESUMPTIVE POSTIVE SARS-CoV-2 nucleic acids MAY BE PRESENT.   A presumptive positive result was obtained on the submitted specimen  and confirmed on repeat testing.  While 2019 novel coronavirus  (SARS-CoV-2) nucleic acids may be present in the submitted sample  additional confirmatory testing may be necessary for epidemiological  and / or clinical management purposes  to differentiate between  SARS-CoV-2 and other Sarbecovirus currently known to infect humans.  If clinically indicated additional testing with an alternate test  methodology [phone]) is advised. The SARS-CoV-2 RNA is generally  detectable in upper and lower respiratory sp ecimens during the acute  phase of infection. The expected result is Negative. Fact Sheet for Patients:  StrictlyIdeas.no Fact Sheet for Healthcare Providers: BankingDealers.co.za This test is not yet approved or cleared by the [place] FDA and has been authorized for detection and/or diagnosis of SARS-CoV-2 by FDA under an Emergency Use Authorization (EUA).  This EUA will remain in effect (meaning this test can be used) for the duration of the COVID-19 declaration under Section 564(b)(1) of the Act, 21 U.S.C. section 360bbb-3(b)(1), unless the authorization is terminated or revoked sooner. Performed at [facility], [place] [address] [place], [place] [address]      Labs: BNP (last 3 results) No results for input(s): BNP in the last 8760 hours. Basic Metabolic Panel: Recent Labs  Lab 12/08/18 1827 12/08/18 1855 12/09/18 0335 12/10/18 0627  NA 139 142 142 142  K 4.4 4.5 4.2 4.1  CL 108 108 112* 110  CO2 22  --  20* 23  GLUCOSE 277* 258* 146* 226*  BUN  29* 29* 23 24*  CREATININE 2.04* 1.90* 1.80* 1.71*  CALCIUM 9.0  --  8.8* 9.0  MG  --   --   --  1.4*  PHOS  --   --   --  2.6   Liver Function Tests: Recent Labs  Lab 12/08/18 1827 12/09/18 0335  AST 19 25  ALT 19 19  ALKPHOS 86 82  BILITOT 0.5 0.3  PROT 6.3* 5.8*  ALBUMIN 3.4* 3.2*   No results for input(s): LIPASE, AMYLASE in the last 168 hours. No results for input(s): AMMONIA in the last 168 hours. CBC: Recent Labs  Lab 12/08/18 1827 12/08/18 1855 12/10/18 0627  WBC 7.8  --  10.1  NEUTROABS 6.2  --  8.0*  HGB 9.4* 10.2* 8.9*  HCT 30.5* 30.0* 29.1*  MCV 96.8  --  96.0  PLT 236  --  217   Cardiac Enzymes: No results for input(s): CKTOTAL, CKMB, CKMBINDEX, TROPONINI in the last 168 hours. BNP: Invalid input(s): POCBNP CBG: Recent Labs  Lab 12/11/18 2348 12/12/18 0459 12/12/18 0756 12/12/18 1132 12/12/18 1554  GLUCAP 151* 171* 167* 100* 149*   D-Dimer No results for input(s): DDIMER in the last 72 hours. Hgb A1c No results for input(s): HGBA1C in the last 72 hours. Lipid Profile No results for input(s): CHOL, HDL, LDLCALC, TRIG, CHOLHDL, LDLDIRECT in the last 72 hours. Thyroid function studies No results for input(s): TSH, T4TOTAL, T3FREE, THYROIDAB in the last 72 hours.  Invalid input(s): FREET3 Anemia work up No results for input(s): VITAMINB12, FOLATE, FERRITIN, TIBC, IRON, RETICCTPCT in the last 72 hours. Urinalysis    Component Value Date/Time   COLORURINE STRAW (A) 12/09/2018 0109   APPEARANCEUR HAZY (A) 12/09/2018 0109   LABSPEC 1.039 (H) 12/09/2018 0109   PHURINE 5.0 12/09/2018 0109   GLUCOSEU NEGATIVE 12/09/2018 0109   HGBUR SMALL (A) 12/09/2018 0109   BILIRUBINUR NEGATIVE 12/09/2018 0109   KETONESUR NEGATIVE 12/09/2018 0109   PROTEINUR 30 (A) 12/09/2018 0109   NITRITE NEGATIVE 12/09/2018 0109   LEUKOCYTESUR LARGE (A) 12/09/2018 0109   Sepsis Labs Invalid input(s): PROCALCITONIN,  WBC,  [place] Microbiology Recent Results  (from the past 240 hour(s))  SARS Coronavirus 2 [facility] order, Performed in [facility] hospital lab) Nasopharyngeal Nasopharyngeal Swab     Status: None   Collection Time: 12/08/18  8:45 PM   Specimen: Nasopharyngeal Swab  Result Value Ref Range Status   SARS Coronavirus 2 NEGATIVE NEGATIVE Final    Comment: (NOTE) If result is NEGATIVE SARS-CoV-2 target nucleic acids are NOT DETECTED. The SARS-CoV-2 RNA is generally detectable in upper and lower  respiratory specimens during the acute phase of infection. The lowest  concentration of SARS-CoV-2 viral copies this assay can detect is 250  copies / mL. A negative result does not preclude SARS-CoV-2 infection  and should not be used as the sole basis for treatment or other  patient management decisions.  A negative result may occur with  improper specimen collection / handling, submission of specimen other  than nasopharyngeal swab, presence of viral mutation(s) within the  areas targeted by this assay, and inadequate number of viral copies  (<250 copies / mL). A negative result must be combined with clinical  observations, patient history, and epidemiological information. If result is POSITIVE SARS-CoV-2 target nucleic acids are DETECTED. The SARS-CoV-2 RNA is generally detectable in upper and lower  respiratory specimens dur ing the acute phase of infection.  Positive  results are indicative of active infection with SARS-CoV-2.  Clinical  correlation with patient history and other diagnostic information is  necessary to determine patient infection status.  Positive results do  not rule out bacterial infection or co-infection with other viruses. If result is PRESUMPTIVE POSTIVE SARS-CoV-2 nucleic acids MAY BE PRESENT.   A presumptive positive result was obtained on the submitted specimen  and confirmed on repeat testing.  While 2019 novel coronavirus  (SARS-CoV-2) nucleic acids may be present in the submitted sample  additional  confirmatory testing may be necessary for epidemiological  and / or clinical management purposes  to differentiate between  SARS-CoV-2 and other Sarbecovirus currently known to infect humans.  If clinically indicated additional testing with an alternate test  methodology [phone]) is advised. The SARS-CoV-2 RNA is generally  detectable in upper and lower respiratory sp ecimens during the acute  phase of infection. The expected result is Negative. Fact Sheet for Patients:  StrictlyIdeas.no Fact Sheet for Healthcare Providers: BankingDealers.co.za This test is not yet approved or cleared by the [place] FDA and has been authorized for detection and/or diagnosis of SARS-CoV-2 by FDA under an Emergency Use Authorization (EUA).  This EUA will remain in effect (meaning this test can be used) for the duration of the COVID-19 declaration under Section 564(b)(1) of the Act, 21 U.S.C. section 360bbb-3(b)(1), unless the authorization is terminated or revoked sooner. Performed at [facility], [place] [address], [place], [place] [address]      Time coordinating discharge:  32 minutes  SIGNED:   Barb Merino, MD  [facility] 12/14/2018, 1:06 PM

## 2018-12-15 ENCOUNTER — Encounter (HOSPITAL_COMMUNITY): Payer: Self-pay | Admitting: Emergency Medicine

## 2018-12-15 NOTE — Progress Notes (Signed)
PROGRESS NOTE    Peter Nicholson  [id number] DOB: Feb 24, 1941 DOA: 12/08/2018 PCP: No primary care provider on file.    Brief Narrative:  Admitted with recurrent stroke.   Assessment & Plan:   Principal Problem:   Ischemic stroke ([place]) Active Problems:   Insulin-requiring or dependent type II diabetes mellitus ([place])   Essential hypertension   Vascular dementia ([employer])   Depression with anxiety   Acute renal failure superimposed on stage 3 chronic kidney disease (HCC)   Normocytic anemia   Seizure ([place])   Acute ischemic stroke [facility])   Advanced care planning/counseling discussion   Goals of care, counseling/discussion   Palliative care by specialist   Terminal care  78 year old male with history of hypertension, insulin-dependent type 2 diabetes, CKD stage III, dementia, depression, anxiety and recent stroke on 5/20 presented to the emergency department for evaluation of worsening right-sided weakness, difficult to talk. Has history of dementia. Wife noted acute onset aphasia and increasing difficulty with walking. No seizures at home. Upon arrival to the emergency room, patient was afebrile, on room air. Slight renal insufficiency. Stable normocytic anemia. CT head showed acute or subacute extension of infarction of left parietal lobe. No emergent large vessel occlusion. While in the emergency room, he had 2 witnessed generalized tonic-clonic seizure. Remains encephalopathic since then.  Acute ischemic stroke with right-sided weakness and aphasia and persistent encephalopathy Generalized tonic-clonic seizure with prolonged postictal state/delirium and agitation Acute kidney injury Dementia  Given overall poor prognosis and outcome, poor neurological outcome, unable to eat and no desire for artificial feeding, patient was evaluated by palliative care and hospice and family agreed on hospice level of care for this patient. Patient does have short lifespan with no longer  eating or drinking, persistent encephalopathy and is expected to die soon.  Currently with comfort care measures. Today arrangements were made for him to go home with home hospice.  All DME's provided by hospice. Send his prescriptions to pharmacy for end-of-life care.    DVT prophylaxis: SCDs Code Status: DNR Family Communication: Wife Disposition Plan: Home with hospice today.   Consultants:   Palliative medicine  Neurology  Procedures:   EEG  Antimicrobials:   None   Subjective: Patient seen and examined.  Wife at the bedside.  Remains unresponsive with deep breathing and obtunded.  Objective: Vitals:   12/14/18 1551 12/14/18 1957 12/14/18 2354 12/15/18 0749  BP:  128/80  138/76  Pulse:  (!) 114  (!) 127  Resp:  18  20  Temp: (!) 103.1 F (39.5 C) 98.7 F (37.1 C) 98.9 F (37.2 C) (!) 102.2 F (39 C)  TempSrc: Axillary Axillary Axillary Axillary  SpO2:  90%  (!) 82%  Weight:      Height:        Intake/Output Summary (Last 24 hours) at 12/15/2018 1053 Last data filed at 12/14/2018 2254 Gross per 24 hour  Intake 0 ml  Output 200 ml  Net -200 ml   Filed Weights   12/08/18 2030 12/10/18 0333 12/11/18 0400  Weight: 75.3 kg 76 kg 78 kg    Examination:  Physical Exam  Constitutional: No distress.  Stuporous, minimally responsive, not in any distress  HENT:  Head: Normocephalic and atraumatic.  Neck: Normal range of motion. Neck supple.  Cardiovascular: Normal rate and regular rhythm.  Tachycardic  Pulmonary/Chest: No respiratory distress. He has no wheezes. He has no rales.      Data Reviewed: I have personally reviewed following labs and imaging  studies  CBC: Recent Labs  Lab 12/08/18 1827 12/08/18 1855 12/10/18 0627  WBC 7.8  --  10.1  NEUTROABS 6.2  --  8.0*  HGB 9.4* 10.2* 8.9*  HCT 30.5* 30.0* 29.1*  MCV 96.8  --  96.0  PLT 236  --  [id number]   Basic Metabolic Panel: Recent Labs  Lab 12/08/18 1827 12/08/18 1855 12/09/18 0335  12/10/18 0627  NA 139 142 142 142  K 4.4 4.5 4.2 4.1  CL 108 108 112* 110  CO2 22  --  20* 23  GLUCOSE 277* 258* 146* 226*  BUN 29* 29* 23 24*  CREATININE 2.04* 1.90* 1.80* 1.71*  CALCIUM 9.0  --  8.8* 9.0  MG  --   --   --  1.4*  PHOS  --   --   --  2.6   GFR: Estimated Creatinine Clearance: 34.4 mL/min (A) (by C-G formula based on SCr of 1.71 mg/dL (H)). Liver Function Tests: Recent Labs  Lab 12/08/18 1827 12/09/18 0335  AST 19 25  ALT 19 19  ALKPHOS 86 82  BILITOT 0.5 0.3  PROT 6.3* 5.8*  ALBUMIN 3.4* 3.2*   No results for input(s): LIPASE, AMYLASE in the last 168 hours. No results for input(s): AMMONIA in the last 168 hours. Coagulation Profile: Recent Labs  Lab 12/08/18 1827  INR 1.0   Cardiac Enzymes: No results for input(s): CKTOTAL, CKMB, CKMBINDEX, TROPONINI in the last 168 hours. BNP (last 3 results) No results for input(s): PROBNP in the last 8760 hours. HbA1C: No results for input(s): HGBA1C in the last 72 hours. CBG: Recent Labs  Lab 12/11/18 2348 12/12/18 0459 12/12/18 0756 12/12/18 1132 12/12/18 1554  GLUCAP 151* 171* 167* 100* 149*   Lipid Profile: No results for input(s): CHOL, HDL, LDLCALC, TRIG, CHOLHDL, LDLDIRECT in the last 72 hours. Thyroid Function Tests: No results for input(s): TSH, T4TOTAL, FREET4, T3FREE, THYROIDAB in the last 72 hours. Anemia Panel: No results for input(s): VITAMINB12, FOLATE, FERRITIN, TIBC, IRON, RETICCTPCT in the last 72 hours. Sepsis Labs: No results for input(s): PROCALCITON, LATICACIDVEN in the last 168 hours.  Recent Results (from the past 240 hour(s))  SARS Coronavirus 2 [facility] order, Performed in [facility] hospital lab) Nasopharyngeal Nasopharyngeal Swab     Status: None   Collection Time: 12/08/18  8:45 PM   Specimen: Nasopharyngeal Swab  Result Value Ref Range Status   SARS Coronavirus 2 NEGATIVE NEGATIVE Final    Comment: (NOTE) If result is NEGATIVE SARS-CoV-2 target nucleic acids are  NOT DETECTED. The SARS-CoV-2 RNA is generally detectable in upper and lower  respiratory specimens during the acute phase of infection. The lowest  concentration of SARS-CoV-2 viral copies this assay can detect is 250  copies / mL. A negative result does not preclude SARS-CoV-2 infection  and should not be used as the sole basis for treatment or other  patient management decisions.  A negative result may occur with  improper specimen collection / handling, submission of specimen other  than nasopharyngeal swab, presence of viral mutation(s) within the  areas targeted by this assay, and inadequate number of viral copies  (<250 copies / mL). A negative result must be combined with clinical  observations, patient history, and epidemiological information. If result is POSITIVE SARS-CoV-2 target nucleic acids are DETECTED. The SARS-CoV-2 RNA is generally detectable in upper and lower  respiratory specimens dur ing the acute phase of infection.  Positive  results are indicative of active infection with SARS-CoV-2.  Clinical  correlation with patient history and other diagnostic information is  necessary to determine patient infection status.  Positive results do  not rule out bacterial infection or co-infection with other viruses. If result is PRESUMPTIVE POSTIVE SARS-CoV-2 nucleic acids MAY BE PRESENT.   A presumptive positive result was obtained on the submitted specimen  and confirmed on repeat testing.  While 2019 novel coronavirus  (SARS-CoV-2) nucleic acids may be present in the submitted sample  additional confirmatory testing may be necessary for epidemiological  and / or clinical management purposes  to differentiate between  SARS-CoV-2 and other Sarbecovirus currently known to infect humans.  If clinically indicated additional testing with an alternate test  methodology [phone]) is advised. The SARS-CoV-2 RNA is generally  detectable in upper and lower respiratory sp ecimens  during the acute  phase of infection. The expected result is Negative. Fact Sheet for Patients:  StrictlyIdeas.no Fact Sheet for Healthcare Providers: BankingDealers.co.za This test is not yet approved or cleared by the [place] FDA and has been authorized for detection and/or diagnosis of SARS-CoV-2 by FDA under an Emergency Use Authorization (EUA).  This EUA will remain in effect (meaning this test can be used) for the duration of the COVID-19 declaration under Section 564(b)(1) of the Act, 21 U.S.C. section 360bbb-3(b)(1), unless the authorization is terminated or revoked sooner. Performed at [facility], [place] [address], [place], [place] [address]          Radiology Studies: No results found.      Scheduled Meds: . chlorhexidine  15 mL Mouth/Throat QID   Continuous Infusions: . levETIRAcetam 500 mg (12/14/18 2254)     LOS: 6 days    Time spent: 15 minutes    Barb Merino, MD [facility] Pager [phone]  If 7PM-7AM, please contact night-coverage [email or web address] Password [email or web address] 12/15/2018, 10:53 AM

## 2018-12-15 NOTE — Progress Notes (Signed)
CSW contacted by Hospice liaison that equipment was delivered too late last night for transport home. Transport has been requested for patient home this morning.  Laveda Abbe, [place] Clinical Social Worker [phone]

## 2018-12-30 ENCOUNTER — Encounter: Payer: Self-pay | Admitting: Neurology

## 2019-01-10 ENCOUNTER — Ambulatory Visit: Payer: Medicare Other | Admitting: Neurology

## 2019-01-12 DEATH — deceased
